# Patient Record
Sex: Female | Born: 1950 | Race: White | Hispanic: No | Smoking: Former smoker
Health system: Southern US, Community
[De-identification: ages and names within clinical notes are randomized; demographics above are authoritative.]

## PROBLEM LIST (undated history)

## (undated) DIAGNOSIS — I82409 Acute embolism and thrombosis of unspecified deep veins of unspecified lower extremity: Secondary | ICD-10-CM

## (undated) DIAGNOSIS — D589 Hereditary hemolytic anemia, unspecified: Secondary | ICD-10-CM

## (undated) DIAGNOSIS — I10 Essential (primary) hypertension: Secondary | ICD-10-CM

## (undated) DIAGNOSIS — I7090 Unspecified atherosclerosis: Secondary | ICD-10-CM

## (undated) DIAGNOSIS — I639 Cerebral infarction, unspecified: Secondary | ICD-10-CM

## (undated) HISTORY — DX: Acute embolism and thrombosis of unspecified deep veins of unspecified lower extremity: I82.409

---

## 2006-11-15 HISTORY — PX: ANKLE SURGERY: SHX546

## 2018-01-24 DIAGNOSIS — Z720 Tobacco use: Secondary | ICD-10-CM | POA: Insufficient documentation

## 2018-01-24 DIAGNOSIS — Z0000 Encounter for general adult medical examination without abnormal findings: Secondary | ICD-10-CM | POA: Diagnosis not present

## 2019-01-04 ENCOUNTER — Telehealth: Payer: Self-pay

## 2019-01-04 NOTE — Telephone Encounter (Signed)
Pt appears on [facility] Quality Report for [facility] but has never been seen in the office.  I called the pt to confirm her PCP, but I received a message that the call couldn't be completed at this time. VDM (DD)

## 2021-10-01 ENCOUNTER — Other Ambulatory Visit: Payer: Self-pay

## 2021-10-01 ENCOUNTER — Emergency Department (HOSPITAL_COMMUNITY): Payer: Medicare Other | Admitting: Certified Registered"

## 2021-10-01 ENCOUNTER — Emergency Department (HOSPITAL_COMMUNITY): Payer: Medicare Other

## 2021-10-01 ENCOUNTER — Inpatient Hospital Stay (HOSPITAL_COMMUNITY)
Admission: EM | Admit: 2021-10-01 | Discharge: 2021-10-07 | DRG: 024 | Disposition: A | Discharge status: Rehabilitation Facility | Payer: Medicare Other | Attending: Neurology | Admitting: Neurology

## 2021-10-01 ENCOUNTER — Inpatient Hospital Stay (HOSPITAL_COMMUNITY): Payer: Medicare Other

## 2021-10-01 ENCOUNTER — Encounter (HOSPITAL_COMMUNITY)
Admission: EM | Disposition: A | Discharge status: Rehabilitation Facility | Payer: Self-pay | Source: Home / Self Care | Attending: Neurology

## 2021-10-01 DIAGNOSIS — I10 Essential (primary) hypertension: Secondary | ICD-10-CM | POA: Diagnosis not present

## 2021-10-01 DIAGNOSIS — R29706 NIHSS score 6: Secondary | ICD-10-CM | POA: Diagnosis not present

## 2021-10-01 DIAGNOSIS — I63322 Cerebral infarction due to thrombosis of left anterior cerebral artery: Secondary | ICD-10-CM | POA: Diagnosis not present

## 2021-10-01 DIAGNOSIS — E871 Hypo-osmolality and hyponatremia: Secondary | ICD-10-CM | POA: Diagnosis present

## 2021-10-01 DIAGNOSIS — G319 Degenerative disease of nervous system, unspecified: Secondary | ICD-10-CM | POA: Diagnosis not present

## 2021-10-01 DIAGNOSIS — Z743 Need for continuous supervision: Secondary | ICD-10-CM | POA: Diagnosis not present

## 2021-10-01 DIAGNOSIS — I672 Cerebral atherosclerosis: Secondary | ICD-10-CM | POA: Diagnosis not present

## 2021-10-01 DIAGNOSIS — Z20822 Contact with and (suspected) exposure to covid-19: Secondary | ICD-10-CM | POA: Diagnosis present

## 2021-10-01 DIAGNOSIS — I63521 Cerebral infarction due to unspecified occlusion or stenosis of right anterior cerebral artery: Secondary | ICD-10-CM | POA: Diagnosis not present

## 2021-10-01 DIAGNOSIS — R6889 Other general symptoms and signs: Secondary | ICD-10-CM | POA: Diagnosis not present

## 2021-10-01 DIAGNOSIS — I69341 Monoplegia of lower limb following cerebral infarction affecting right dominant side: Secondary | ICD-10-CM | POA: Diagnosis not present

## 2021-10-01 DIAGNOSIS — E782 Mixed hyperlipidemia: Secondary | ICD-10-CM | POA: Diagnosis not present

## 2021-10-01 DIAGNOSIS — R531 Weakness: Secondary | ICD-10-CM | POA: Diagnosis not present

## 2021-10-01 DIAGNOSIS — F17200 Nicotine dependence, unspecified, uncomplicated: Secondary | ICD-10-CM | POA: Diagnosis present

## 2021-10-01 DIAGNOSIS — R4701 Aphasia: Secondary | ICD-10-CM | POA: Diagnosis not present

## 2021-10-01 DIAGNOSIS — G8191 Hemiplegia, unspecified affecting right dominant side: Secondary | ICD-10-CM | POA: Diagnosis not present

## 2021-10-01 DIAGNOSIS — I6300 Cerebral infarction due to thrombosis of unspecified precerebral artery: Secondary | ICD-10-CM | POA: Diagnosis not present

## 2021-10-01 DIAGNOSIS — E785 Hyperlipidemia, unspecified: Secondary | ICD-10-CM | POA: Diagnosis present

## 2021-10-01 DIAGNOSIS — I63512 Cerebral infarction due to unspecified occlusion or stenosis of left middle cerebral artery: Secondary | ICD-10-CM | POA: Diagnosis not present

## 2021-10-01 DIAGNOSIS — R0902 Hypoxemia: Secondary | ICD-10-CM | POA: Diagnosis not present

## 2021-10-01 DIAGNOSIS — I6523 Occlusion and stenosis of bilateral carotid arteries: Secondary | ICD-10-CM | POA: Diagnosis not present

## 2021-10-01 DIAGNOSIS — I639 Cerebral infarction, unspecified: Secondary | ICD-10-CM | POA: Diagnosis not present

## 2021-10-01 DIAGNOSIS — R404 Transient alteration of awareness: Secondary | ICD-10-CM | POA: Diagnosis not present

## 2021-10-01 DIAGNOSIS — G9389 Other specified disorders of brain: Secondary | ICD-10-CM | POA: Diagnosis not present

## 2021-10-01 DIAGNOSIS — I63522 Cerebral infarction due to unspecified occlusion or stenosis of left anterior cerebral artery: Secondary | ICD-10-CM | POA: Diagnosis not present

## 2021-10-01 DIAGNOSIS — I6389 Other cerebral infarction: Secondary | ICD-10-CM | POA: Diagnosis not present

## 2021-10-01 HISTORY — PX: IR INTRA CRAN STENT: IMG2345

## 2021-10-01 HISTORY — PX: IR US GUIDE VASC ACCESS RIGHT: IMG2390

## 2021-10-01 HISTORY — PX: RADIOLOGY WITH ANESTHESIA: SHX6223

## 2021-10-01 HISTORY — PX: IR PERCUTANEOUS ART THROMBECTOMY/INFUSION INTRACRANIAL INC DIAG ANGIO: IMG6087

## 2021-10-01 LAB — URINALYSIS, ROUTINE W REFLEX MICROSCOPIC
Bacteria, UA: NONE SEEN
Bilirubin Urine: NEGATIVE
Glucose, UA: NEGATIVE mg/dL
Hgb urine dipstick: NEGATIVE
Ketones, ur: 5 mg/dL — AB
Leukocytes,Ua: NEGATIVE
Nitrite: NEGATIVE
Protein, ur: 30 mg/dL — AB
Specific Gravity, Urine: >1.046 — ABNORMAL HIGH (ref 1.005–1.030)
pH: 6 (ref 5.0–8.0)

## 2021-10-01 LAB — CBC
HCT: 44.3 % (ref 36.0–46.0)
Hemoglobin: 15.2 g/dL — ABNORMAL HIGH (ref 12.0–15.0)
MCH: 32.8 pg (ref 26.0–34.0)
MCHC: 34.3 g/dL (ref 30.0–36.0)
MCV: 95.7 fL (ref 80.0–100.0)
Platelets: 246 10*3/uL (ref 150–400)
RBC: 4.63 MIL/uL (ref 3.87–5.11)
RDW: 12.9 % (ref 11.5–15.5)
WBC: 7.9 10*3/uL (ref 4.0–10.5)
nRBC: 0 % (ref 0.0–0.2)

## 2021-10-01 LAB — DIFFERENTIAL
Abs Immature Granulocytes: 0.04 10*3/uL (ref 0.00–0.07)
Basophils Absolute: 0 10*3/uL (ref 0.0–0.1)
Basophils Relative: 0 %
Eosinophils Absolute: 0 10*3/uL (ref 0.0–0.5)
Eosinophils Relative: 0 %
Immature Granulocytes: 1 %
Lymphocytes Relative: 14 %
Lymphs Abs: 1.1 10*3/uL (ref 0.7–4.0)
Monocytes Absolute: 0.5 10*3/uL (ref 0.1–1.0)
Monocytes Relative: 6 %
Neutro Abs: 6.2 10*3/uL (ref 1.7–7.7)
Neutrophils Relative %: 79 %

## 2021-10-01 LAB — I-STAT CHEM 8, ED
BUN: 23 mg/dL (ref 8–23)
Calcium, Ion: 1.17 mmol/L (ref 1.15–1.40)
Chloride: 106 mmol/L (ref 98–111)
Creatinine, Ser: 0.7 mg/dL (ref 0.44–1.00)
Glucose, Bld: 114 mg/dL — ABNORMAL HIGH (ref 70–99)
HCT: 44 % (ref 36.0–46.0)
Hemoglobin: 15 g/dL (ref 12.0–15.0)
Potassium: 4.2 mmol/L (ref 3.5–5.1)
Sodium: 139 mmol/L (ref 135–145)
TCO2: 23 mmol/L (ref 22–32)

## 2021-10-01 LAB — COMPREHENSIVE METABOLIC PANEL
ALT: 25 U/L (ref 0–44)
AST: 31 U/L (ref 15–41)
Albumin: 3.9 g/dL (ref 3.5–5.0)
Alkaline Phosphatase: 75 U/L (ref 38–126)
Anion gap: 10 (ref 5–15)
BUN: 21 mg/dL (ref 8–23)
CO2: 21 mmol/L — ABNORMAL LOW (ref 22–32)
Calcium: 9.3 mg/dL (ref 8.9–10.3)
Chloride: 103 mmol/L (ref 98–111)
Creatinine, Ser: 0.9 mg/dL (ref 0.44–1.00)
GFR, Estimated: >60 mL/min (ref >60)
Glucose, Bld: 125 mg/dL — ABNORMAL HIGH (ref 70–99)
Potassium: 4.6 mmol/L (ref 3.5–5.1)
Sodium: 134 mmol/L — ABNORMAL LOW (ref 135–145)
Total Bilirubin: 1 mg/dL (ref 0.3–1.2)
Total Protein: 7.2 g/dL (ref 6.5–8.1)

## 2021-10-01 LAB — RAPID URINE DRUG SCREEN, HOSP PERFORMED
Amphetamines: NOT DETECTED
Barbiturates: NOT DETECTED
Benzodiazepines: NOT DETECTED
Cocaine: NOT DETECTED
Opiates: NOT DETECTED
Tetrahydrocannabinol: NOT DETECTED

## 2021-10-01 LAB — RESP PANEL BY RT-PCR (FLU A&B, COVID) ARPGX2
Influenza A by PCR: NEGATIVE
Influenza B by PCR: NEGATIVE
SARS Coronavirus 2 by RT PCR: NEGATIVE

## 2021-10-01 LAB — CBG MONITORING, ED: Glucose-Capillary: 119 mg/dL — ABNORMAL HIGH (ref 70–99)

## 2021-10-01 LAB — MRSA NEXT GEN BY PCR, NASAL: MRSA by PCR Next Gen: NOT DETECTED

## 2021-10-01 LAB — PROTIME-INR
INR: 0.9 (ref 0.8–1.2)
Prothrombin Time: 12.3 seconds (ref 11.4–15.2)

## 2021-10-01 LAB — ETHANOL: Alcohol, Ethyl (B): <10 mg/dL (ref <10)

## 2021-10-01 LAB — APTT: aPTT: 25 seconds (ref 24–36)

## 2021-10-01 IMAGING — XA IR PERCUTANEOUS ART THORMBECTOMY/INFUSION INTRACRANIAL INCLUDE D
9 of 11 series · 12 of 24 positions shown · IV contrast (IODINE)
Comparison: CT/CT angiogram of the head and neck [DATE].

INDICATION: 70-year-old female with past medical history significant for
hyperlipidemia presenting with right leg weakness and aphasia. Leg
weakness status 2 days ago with worsening this morning when
difficulty speaking was also noted. Her last known well was
[DATE]; NIHSS 6; baseline modified HEMI 0. Head CT
showed hypodense areas in the medial left frontal lobe CT angiogram
showed an occlusion of the left A2/ACA. CT perfusion showed no core
infarct with a penumbra of 27 mL. Given discrepancy between CT
perfusion and noncontrast CT, an MRI of the brain was obtained
showing scattered infarcts in the left ACA territory. Given waxing
and waning symptoms and imaging/clinical mismatch, decision was made
to proceed with diagnostic cerebral angiogram and mechanical
thrombectomy after discussion with patient and her husband.

EXAM:
FLUOROSCOPY GUIDED VASCULAR [REDACTED] CEREBRAL ANGIOGRAM
MECHANICAL THROMBECTOMY
INTRACRANIAL STENTING
FLAT PANEL HEAD CT
TECHNIQUE: Informed written consent was obtained from the patient and her
husband after a thorough discussion of the procedural risks,
benefits and alternatives. All questions were addressed.

[Series 2: cerebral · 2 acquisitions, 2 frames shown (1 of 7)]
[im 1/2]
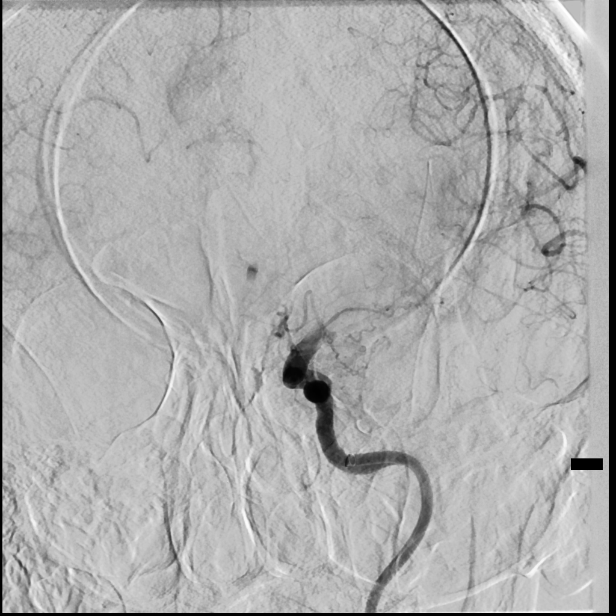
[im 2/2]
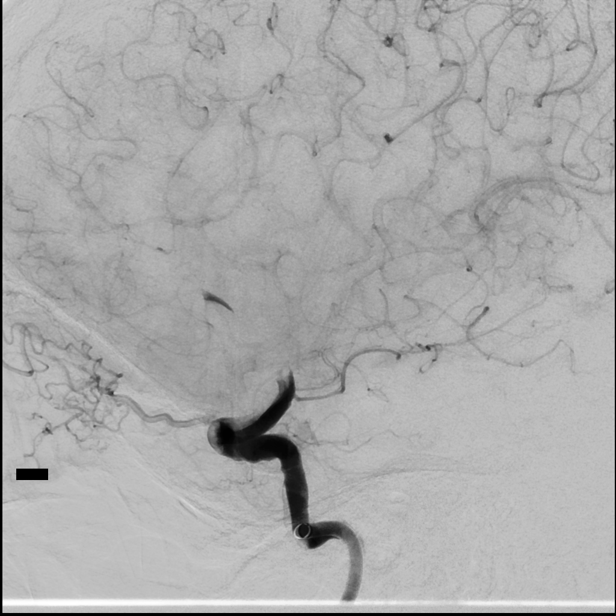

[Series 4: cerebral · 2 acquisitions, 1 frame shown (2 of 7)]
[im 1/2]
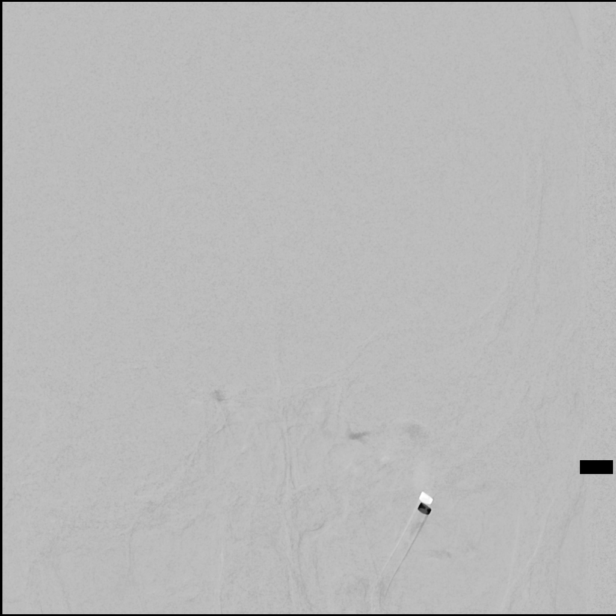

[Series 6: cerebral · 2 acquisitions, 1 frame shown (3 of 7)]
[im 1/2]
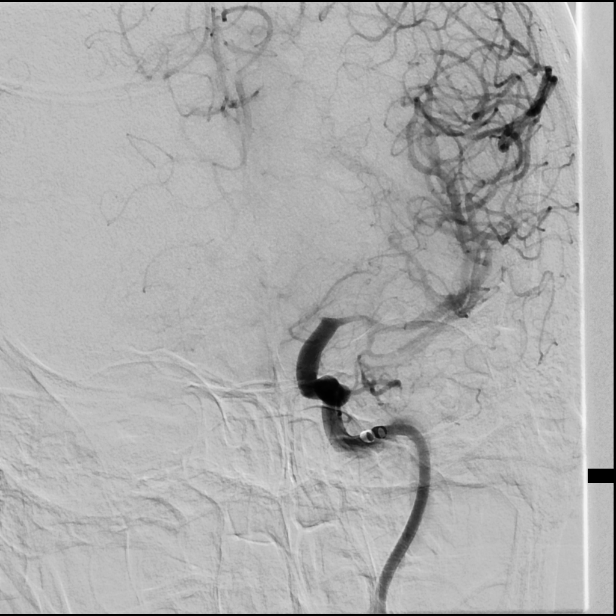

[Series 7: cerebral · 2 acquisitions, 1 frame shown (4 of 7)]
[im 1/2]
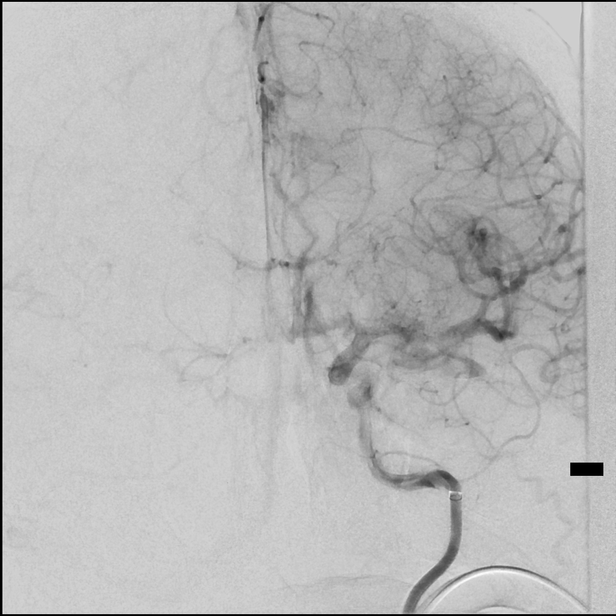

[Series 8: cerebral · 2 acquisitions, 1 frame shown (5 of 7)]
[im 1/2]
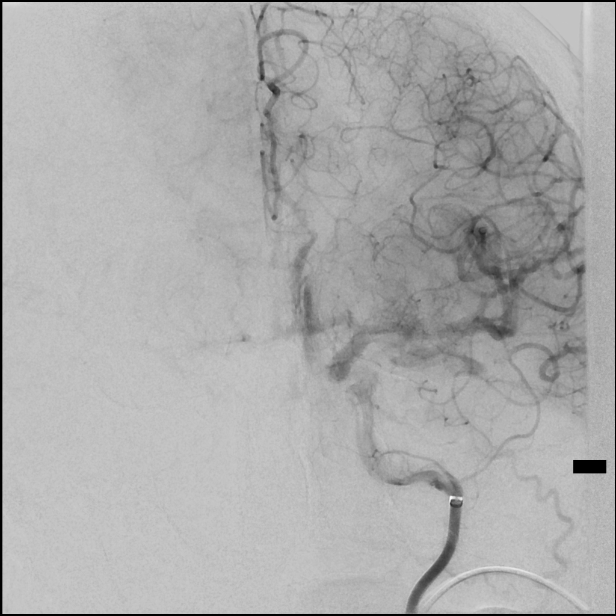

[Series 9: cerebral · 2 acquisitions, 1 frame shown (6 of 7)]
[im 1/2]
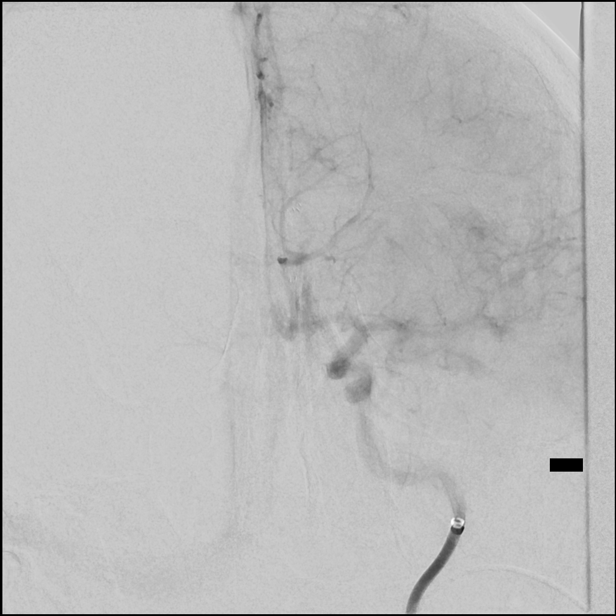

[Series 12: fl neuro · 1 of 40 frames shown]
[frame 7/40]
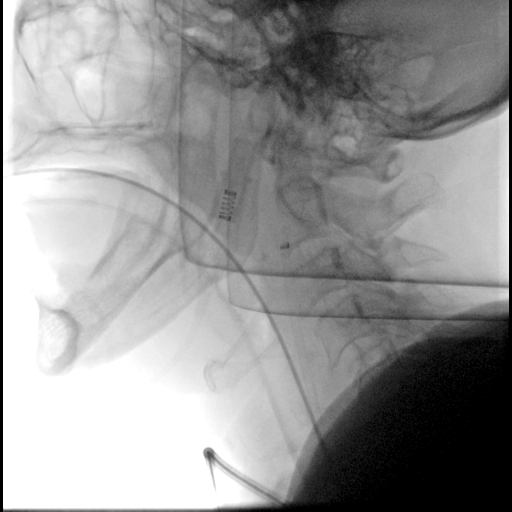

[Series 13: cerebral · 2 acquisitions, 1 frame shown (7 of 7)]
[im 1/2]
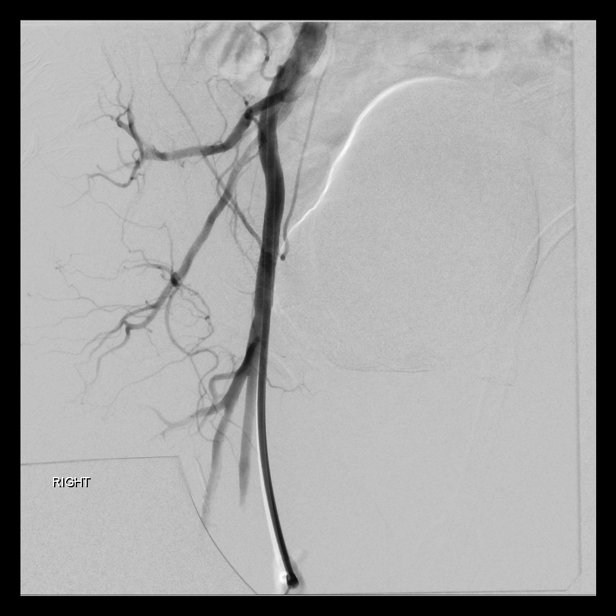

[Series 300: dr. (person_name) · 3 of 22 slices shown]
[im 2/22]
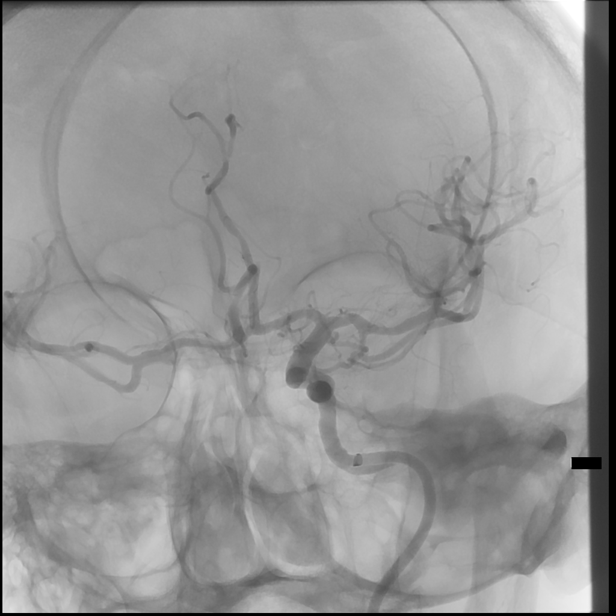
[im 12/22]
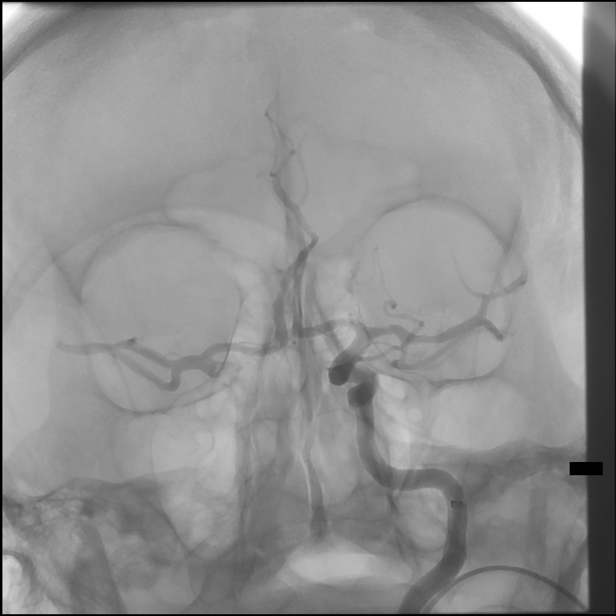
[im 22/22]
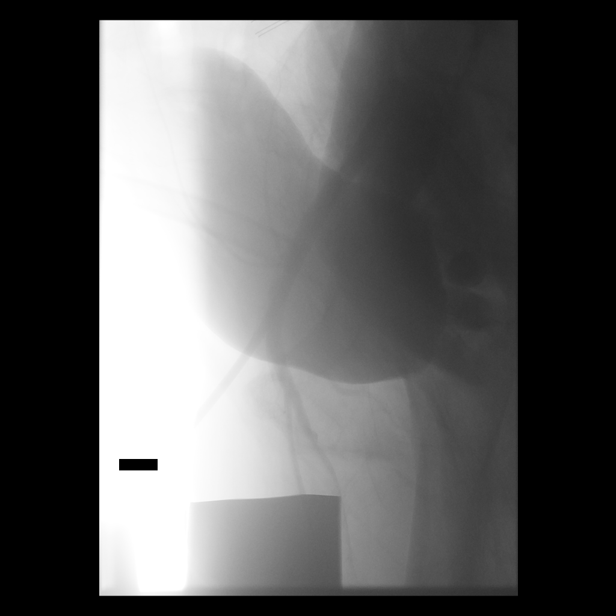

[12 of 24 positions shown; findings below may reference images not displayed]

MEDICATIONS:
Intra arterial Cangrelor loading dose and drip.

ANESTHESIA/SEDATION:
The procedure was performed under general anesthesia.

CONTRAST:  100 mL of Omnipaque 300 milligram/mL

FLUOROSCOPY TIME:  Fluoroscopy Time: 22 minutes 30 seconds (1,164
mGy).

COMPLICATIONS:
SIR LEVEL B - Normal therapy, includes overnight admission for
observation.
Maximal Sterile Barrier Technique was utilized including caps, mask,
sterile gowns, sterile gloves, sterile drape, hand hygiene and skin
antiseptic. A timeout was performed prior to the initiation of the
procedure.

The right groin was prepped and draped in the usual sterile fashion.
Using a micropuncture kit and the modified Seldinger technique,
access was gained to the right common femoral artery and an 8 French
sheath was placed. Real-time ultrasound guidance was utilized for
vascular access including the acquisition of a permanent ultrasound
image documenting patency of the accessed vessel.

Under fluoroscopy, a Zoom 88 guide catheter was navigated over a 6
HEMI 2 catheter and a 0.035" Terumo Glidewire into the
aortic arch. The catheter was placed into the left common carotid
artery and then advanced into the left internal carotid artery. The
inner catheter was removed. Frontal and lateral angiograms of the
head were obtained followed by magnified right anterior oblique,
magnified left anterior oblique and magnified lateral views of the
head.
FINDINGS: 1. The right common femoral artery has normal caliber, adequate for
vascular access.
2. There is an occlusion of the mid left A2/ACA.
3. Intracranial atherosclerotic disease with multifocal areas of
mild stenosis in the left ACA and MCA vascular trees.

PROCEDURE:
Under biplane roadmap, a Zoom 55 aspiration catheter was navigated
over a phenom 21 microcatheter and an Aristotle 18 microguidewire
into the cavernous segment of the left ICA. The aspiration catheter
was then advanced to the level of occlusion in the left A2/ACA and
connected to an aspiration pump. Continuous aspiration was performed
for 2 minutes. The guide catheter was connected to a VacLok syringe.
The aspiration catheter was subsequently removed under constant
aspiration. The guide catheter was aspirated for debris.

Left ICA angiograms showed complete recanalization of the left ACA
([NU]) with luminal irregularity of the A2 segment concerning for
dissection. There is no flow limitation.

Delay left ICA angiograms with magnified frontal and lateral views
of the head showed clot formation within the dissected segment
though without flow limitation. Due to concern for clot propagation
and reocclusion, decision was made to treat dissection with
stenting.

Patient received a loading dose of cangrelor and a drip was
subsequently started.

Under biplane roadmap, an SL 10 microcatheter was navigated over an
Aristotle 14 micro guidewire into the left A3/ACA segment. Then, a 3
x 21 mm neuroform atlas stent was deployed spanning the A2 segment,
covering the dissected segment. Follow-up magnified frontal and
lateral angiograms of the head showed adequate stent positioning
with fully expanded stent and excellent anterograde flow.

Flat panel CT of the head was obtained and post processed in a
separate workstation with concurrent attending physician
supervision. Selected images were sent to PACS. No evidence of
hemorrhagic complication.

Delayed magnified frontal and lateral views of the head centered on
the recently deployed left A2/ACA stent. No in stent clot formation
seen. Preserved anterograde flow.

The catheter was subsequently withdrawn. Right common femoral artery
angiograms were obtained in frontal and lateral views. The puncture
is at the level of the common femoral artery which has adequate
caliber for closure device. The femoral sheath was exchanged over
the wire for a Perclose prostyle which was utilized for access
closure. Immediate hemostasis was achieved.
IMPRESSION: 1. Successful mechanical thrombectomy with 1 pass direct contact
aspiration for treatment of a left A2/ACA occlusion.
2. Procedure was complicated by catheter related dissection treated
with stenting with preservation of the anterograde flow.

PLAN:
Dual anti-platelet therapy for 6 months with evaluation of stent
patency at that time.

## 2021-10-01 IMAGING — CT CT HEAD CODE STROKE
4 series · 15 of 47 positions shown, 17 images · non-contrast
Comparison: None.

CLINICAL DATA: Code stroke. Neuro deficit, acute, stroke suspected.

EXAM:
CT HEAD WITHOUT CONTRAST
TECHNIQUE: Contiguous axial images were obtained from the base of the skull
through the vertex without intravenous contrast.

[Series 2: head wo · axial · 0.34mm/px · z∈[-65,+37]mm · 7 of 34 slices shown, 9 images]
[im 5/34  brain]
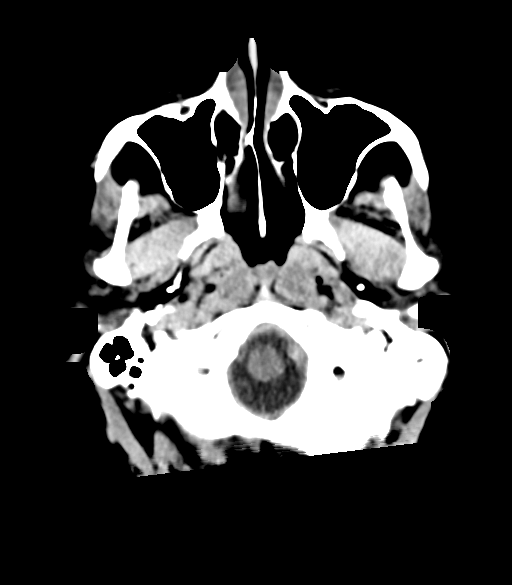
[im 5/34  bone]
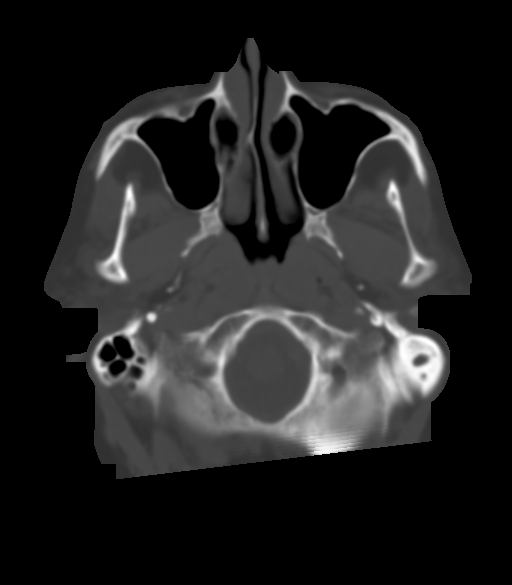
[im 9/34  brain]
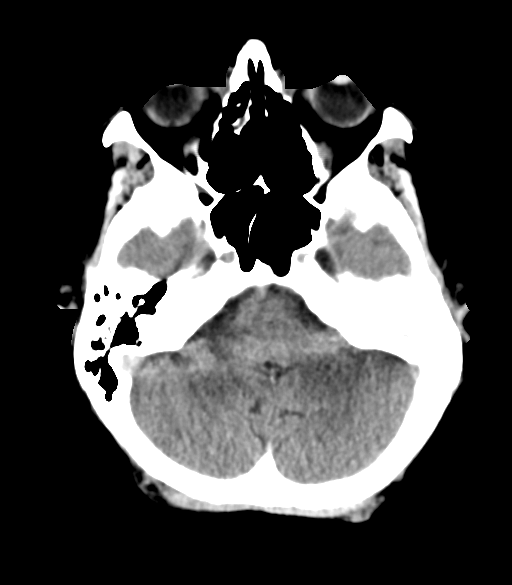
[im 13/34  brain]
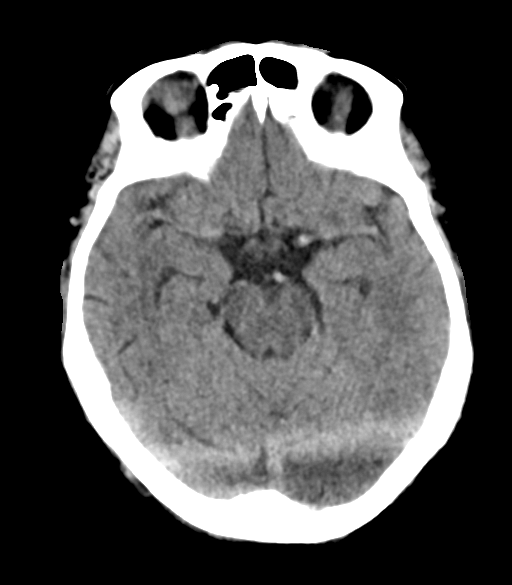
[im 17/34  brain]
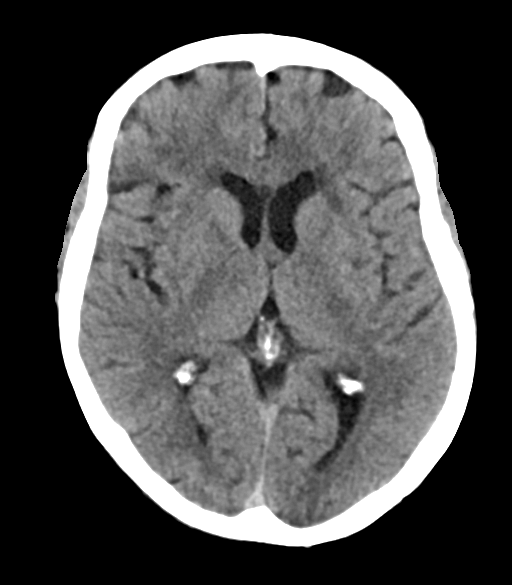
[im 21/34  brain]
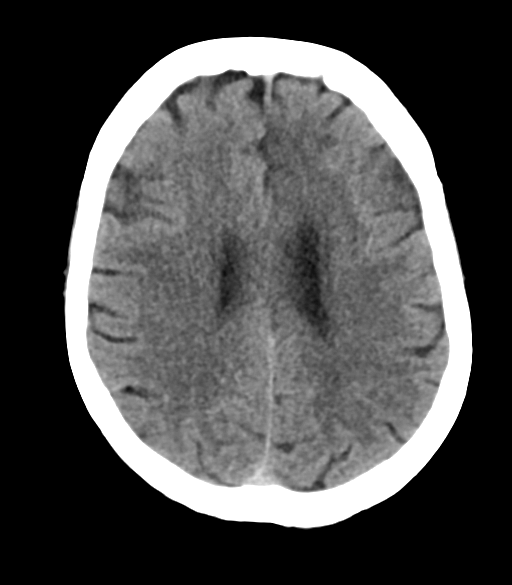
[im 21/34  bone]
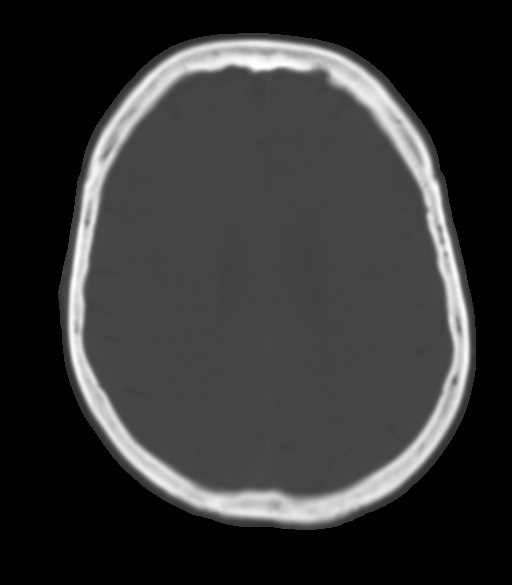
[im 25/34  brain]
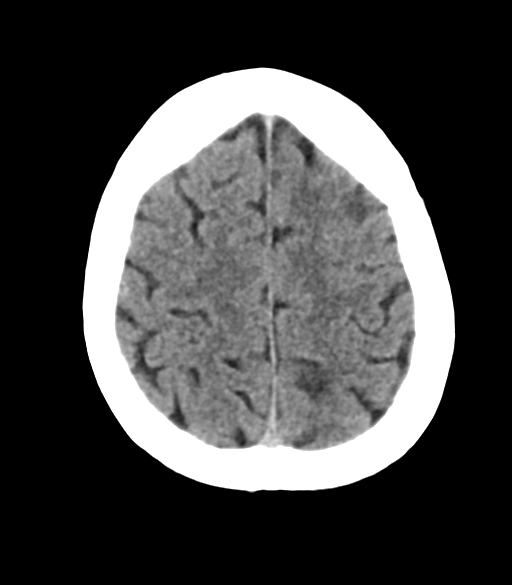
[im 29/34  brain]
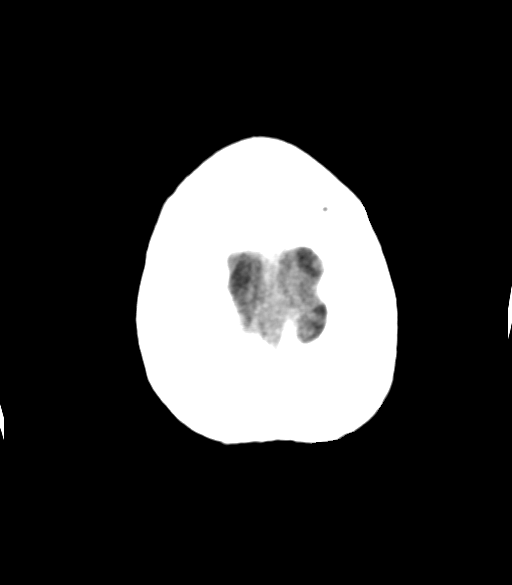

[Series 4: head bone · axial · 0.49mm/px · z∈[-134,-118]mm · 2 of 84 slices shown]
[im 9/84  bone]
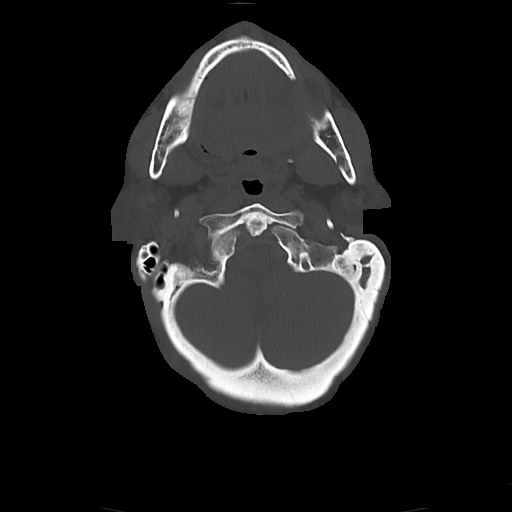
[im 17/84  bone]
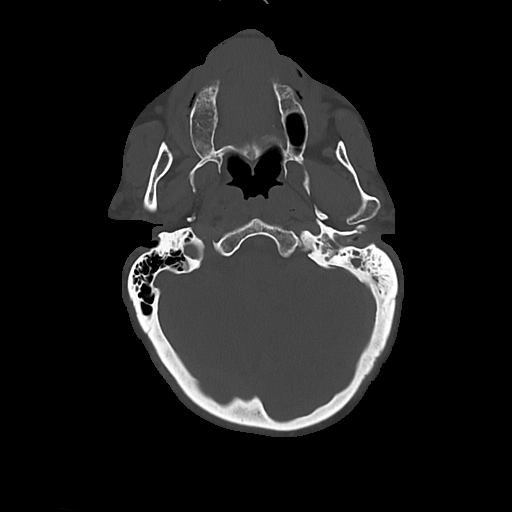

[Series 5: cor soft · coronal · 0.32mm/px · 3 of 67 slices shown]
[im 25/67  brain]
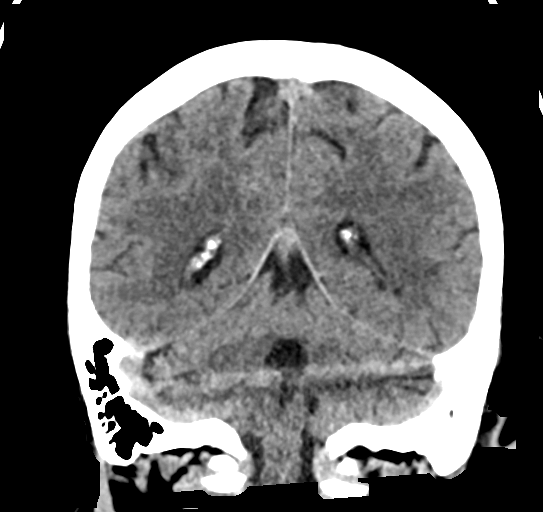
[im 30/67  brain]
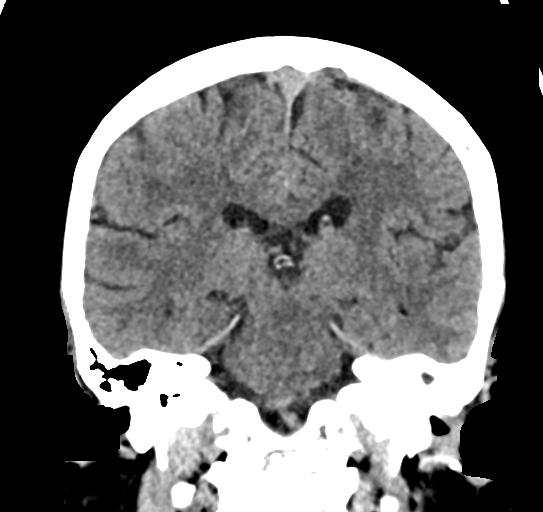
[im 36/67  brain]
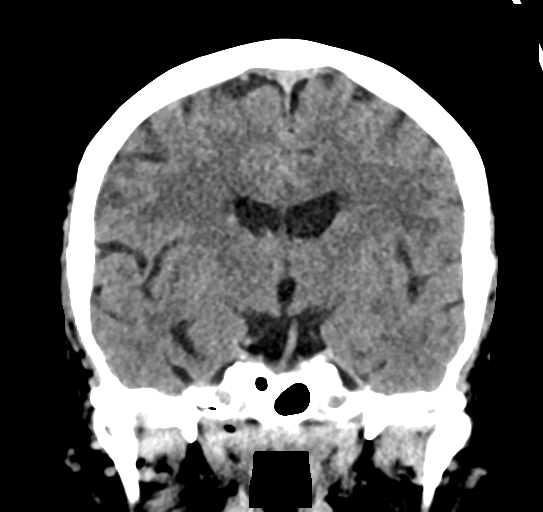

[Series 6: sag soft · sagittal · 0.32mm/px · 3 of 59 slices shown]
[im 20/59  brain]
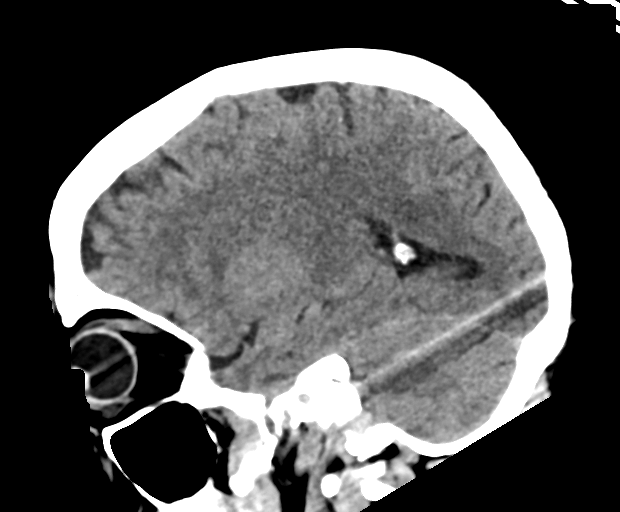
[im 30/59  brain]
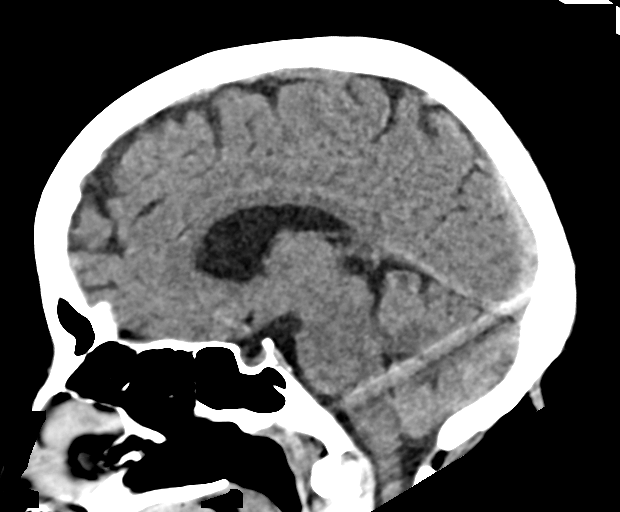
[im 39/59  brain]
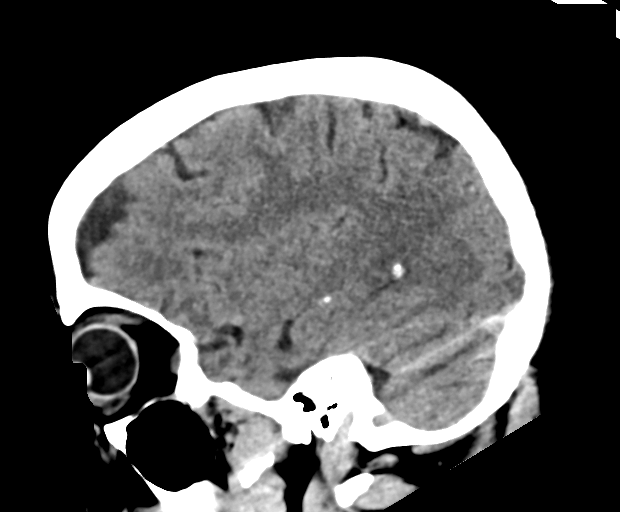

[15 of 47 positions shown; findings below may reference images not displayed]

FINDINGS: Brain: No focal abnormality affects the brainstem or cerebellum.
There is loss of gray-white differentiation in the left frontal lobe
suggesting left anterior cerebral artery territory infarction. There
is an old small vessel infarction in the left basal ganglia. No
mass, hemorrhage, hydrocephalus or extra-axial collection.

Vascular: There is atherosclerotic calcification of the major
vessels at the base of the brain.

Skull: Negative

Sinuses/Orbits: Clear/normal

Other: None

ASPECTS (Alberta Stroke Program Early CT Score)

- Ganglionic level infarction (caudate, lentiform nuclei, internal
capsule, insula, M1-M3 cortex): 7

- Supraganglionic infarction (M4-M6 cortex): 3

Total score (0-10 with 10 being normal): 10
IMPRESSION: 1. Acute infarction in the left frontal lobe, anterior cerebral
artery territory. Loss of gray-white differentiation. No hemorrhage.
2. ASPECTS is 10
3. These results were communicated to Dr. MOFIDUL at [DATE] on
[DATE] by text page via the [employer] messaging system.

## 2021-10-01 IMAGING — CT CT HEAD W/O CM
4 series · 16 of 47 positions shown, 18 images · non-contrast
Comparison: [DATE]

CLINICAL DATA: Stroke, follow-up status post mechanical
thrombectomy and left A2 stenting

EXAM:
CT HEAD WITHOUT CONTRAST
TECHNIQUE: Contiguous axial images were obtained from the base of the skull
through the vertex without intravenous contrast.

[Series 3: head without · axial · non-contrast · 0.43mm/px · z∈[-142,-22]mm · 7 of 34 slices shown, 9 images]
[im 5/34  brain]
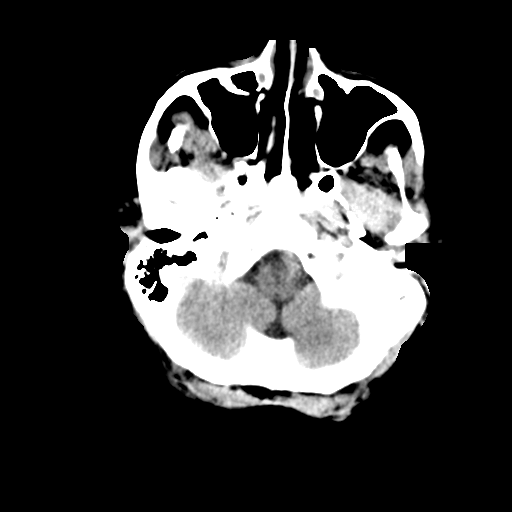
[im 5/34  bone]
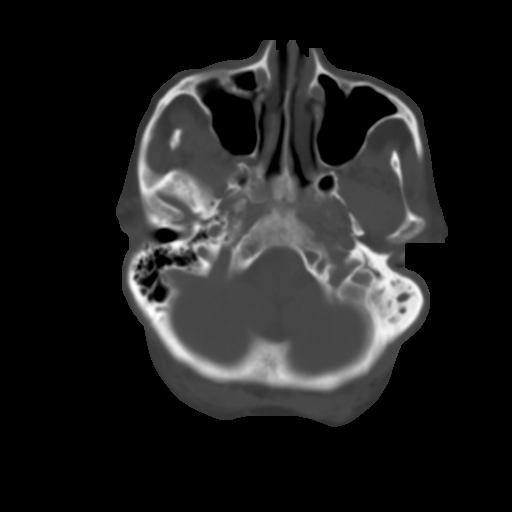
[im 9/34  brain]
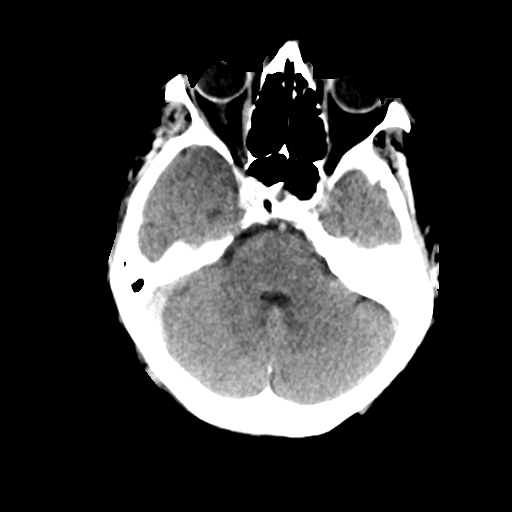
[im 13/34  brain]
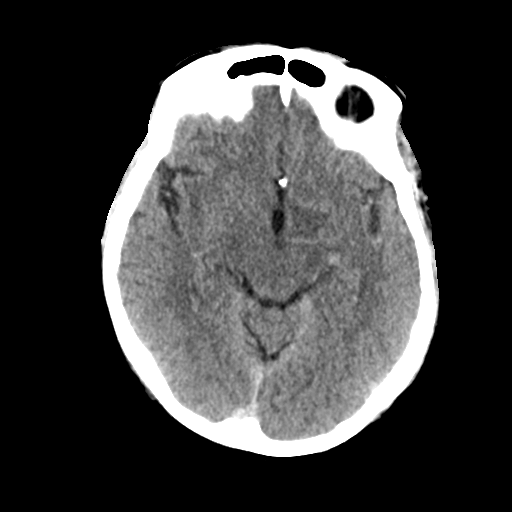
[im 17/34  brain]
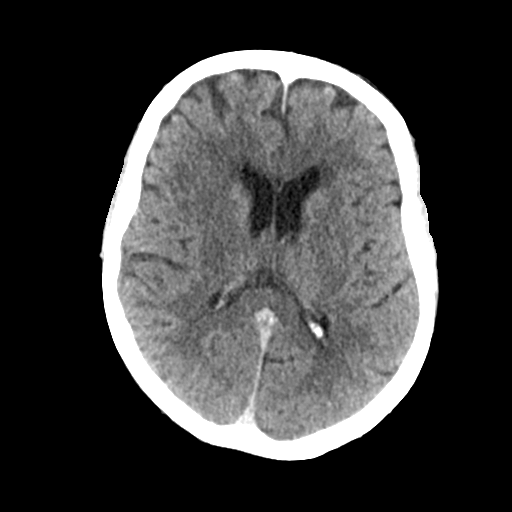
[im 21/34  brain]
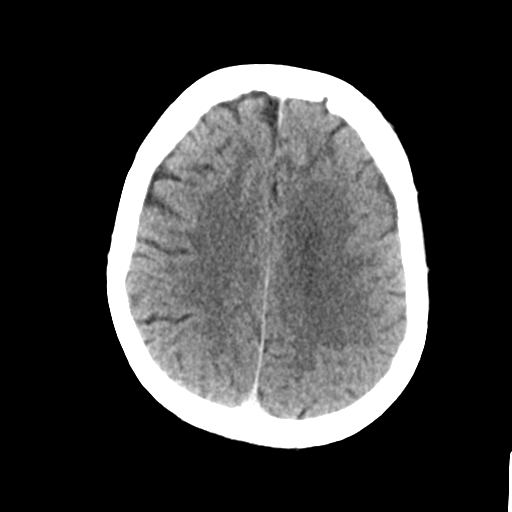
[im 21/34  bone]
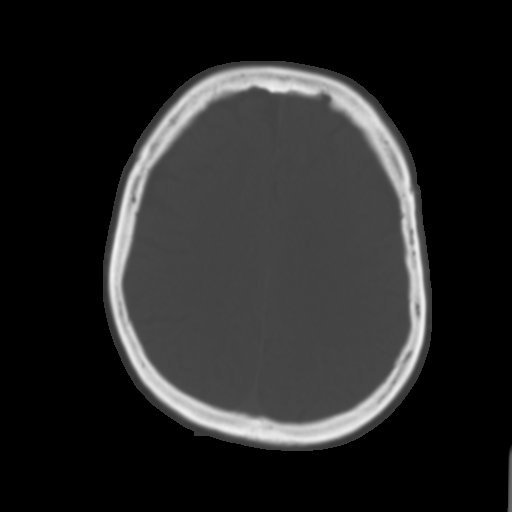
[im 25/34  brain]
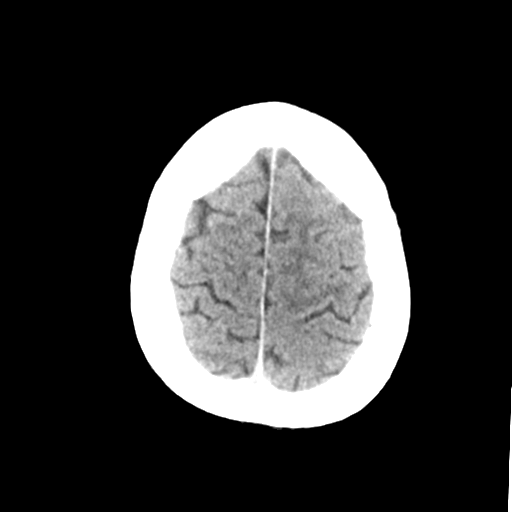
[im 29/34  brain]
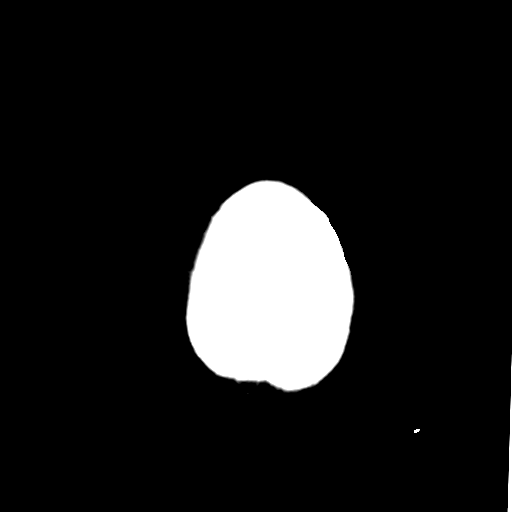

[Series 4: head bone · axial · 0.43mm/px · z∈[-146,-114]mm · 3 of 83 slices shown]
[im 9/83  bone]
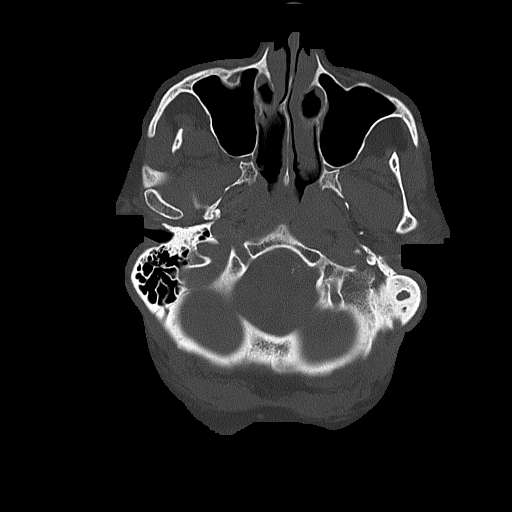
[im 17/83  bone]
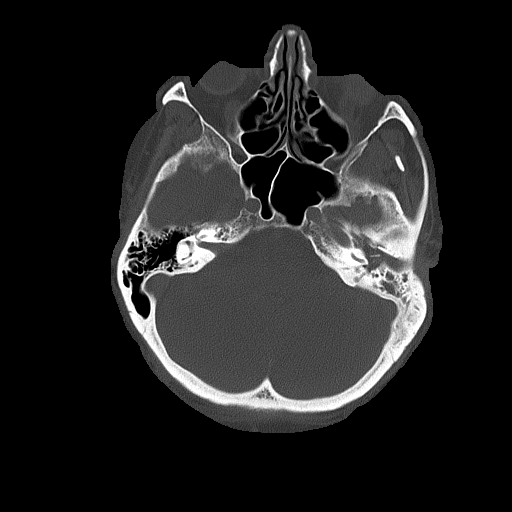
[im 25/83  bone]
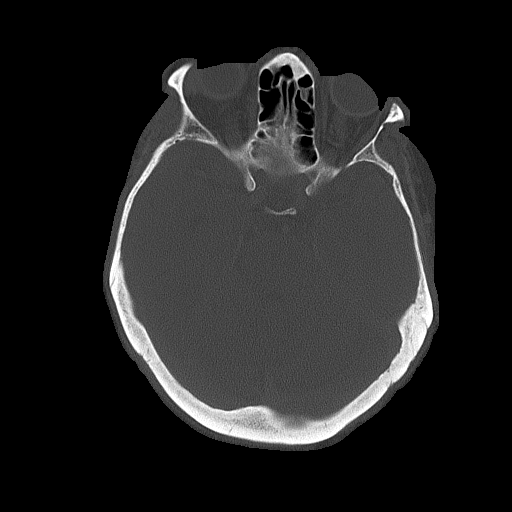

[Series 5: head without cor · coronal · non-contrast · 0.32mm/px · 3 of 67 slices shown]
[im 23/67  brain]
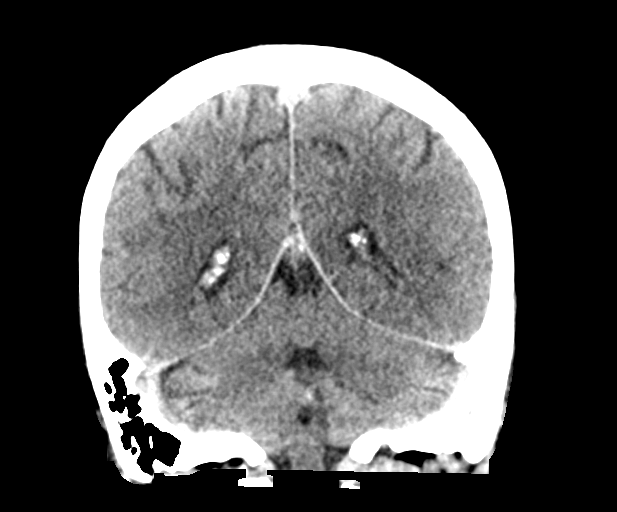
[im 30/67  brain]
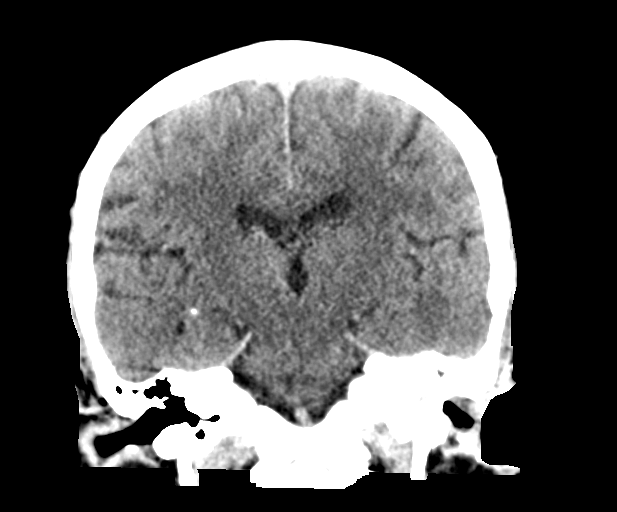
[im 37/67  brain]
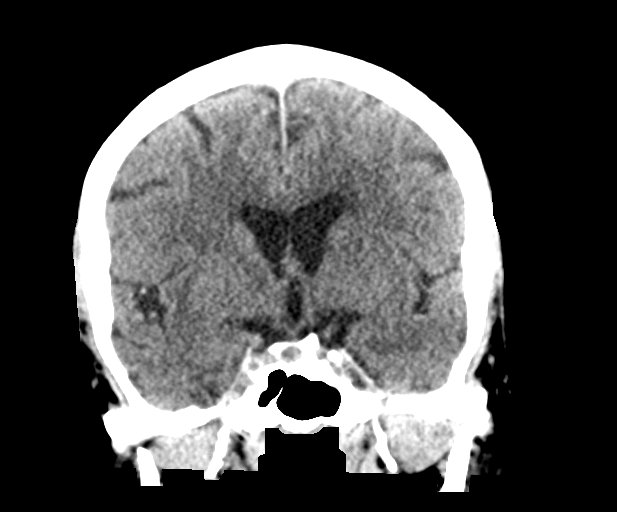

[Series 6: head without sag · sagittal · non-contrast · 0.32mm/px · 3 of 66 slices shown]
[im 22/66  brain]
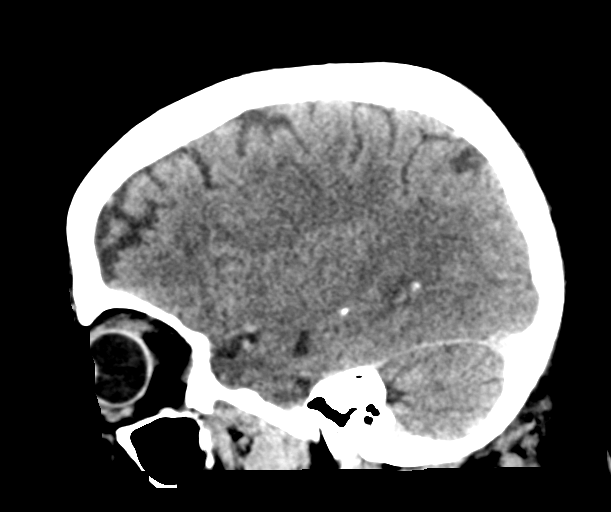
[im 33/66  brain]
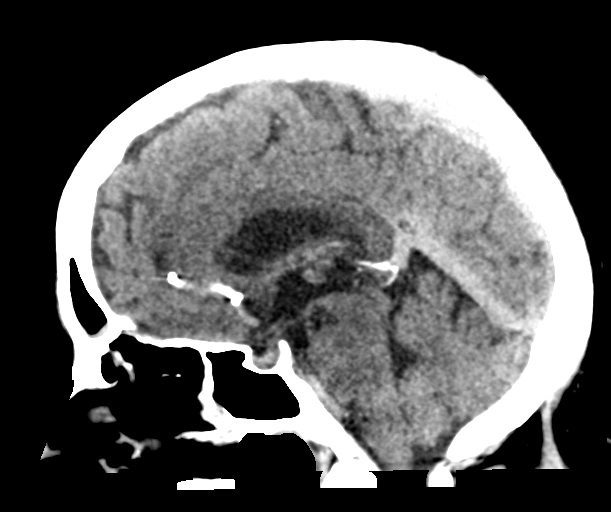
[im 44/66  brain]
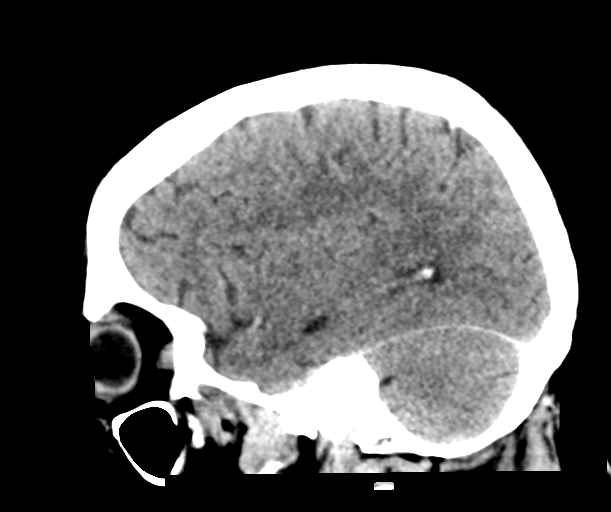

[16 of 47 positions shown; findings below may reference images not displayed]

FINDINGS: Brain: Redemonstrated mild loss of gray-white differentiation in the
medial left frontal lobe, consistent with known left ACA occlusion,
although the loss of gray-white is less distinct on the current
exam. No new area of infarction. No hemorrhage, mass, mass effect,
or midline shift. No hydrocephalus or extra-axial collection.

Vascular: Interval placement of left A2 stent. No hyperdense vessel.
Atherosclerotic calcifications in the intracranial carotid and
vertebral arteries.

Skull: Normal. Negative for fracture or focal lesion.

Sinuses/Orbits: Mucosal thickening in the left frontal sinus and
left anterior ethmoid air cells. The orbits are unremarkable.

Other: Fluid in left mastoid air cells.
IMPRESSION: Status post placement of a left A2 stent, with diminished
conspicuity of previously noted loss of gray-white differentiation
in the medial left frontal lobe. No new area of infarction.

## 2021-10-01 IMAGING — MR MR HEAD W/O CM
5 series · 48 of 48 positions shown · non-contrast
Comparison: CT head [DATE]

CLINICAL DATA: Acute neuro deficit.

EXAM:
MRI HEAD WITHOUT CONTRAST
TECHNIQUE: Multiplanar, multiecho pulse sequences of the brain and surrounding
structures were obtained without intravenous contrast.

[Series 2: DWI · axial · 3.0mm · 0.94mm/px · z∈[-27,+108]mm · 16 of 93 slices shown (1 of 2)]
[im 1/93]
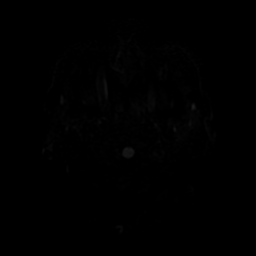
[im 7/93]
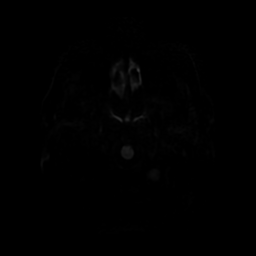
[im 13/93]
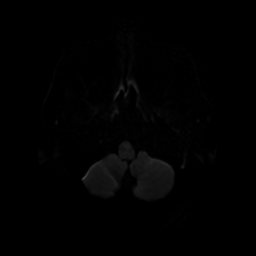
[im 19/93]
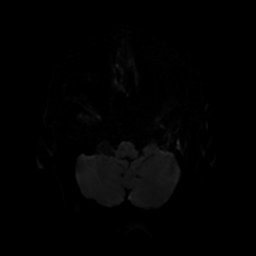
[im 25/93]
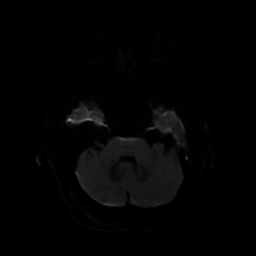
[im 31/93]
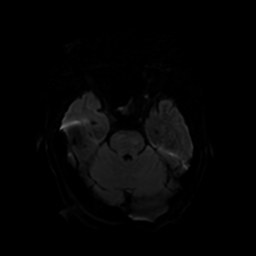
[im 37/93]
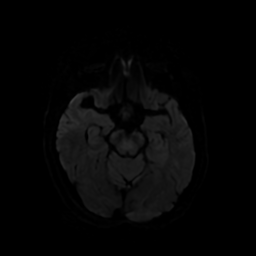
[im 43/93]
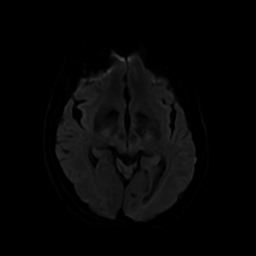
[im 50/93]
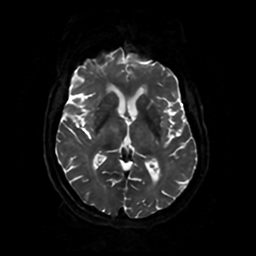
[im 56/93]
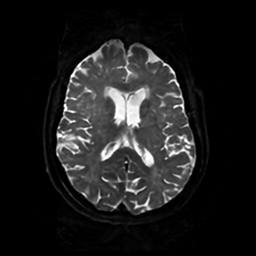
[im 62/93]
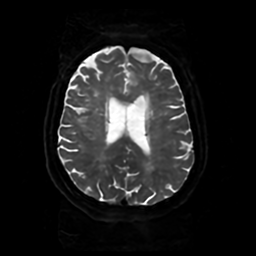
[im 68/93]
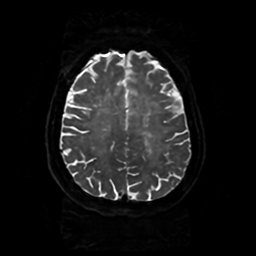
[im 74/93]
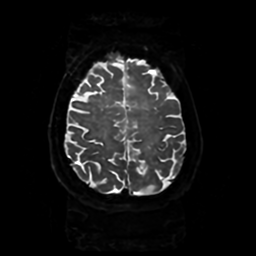
[im 80/93]
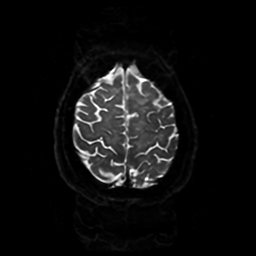
[im 86/93]
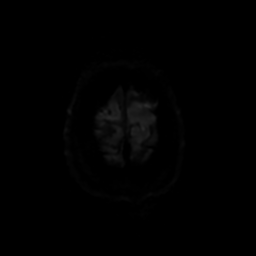
[im 93/93]
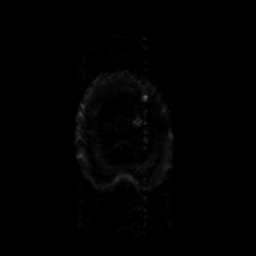

[Series 3: FLAIR · axial · 4.0mm · 0.45mm/px · z∈[-28,+110]mm · 6 of 33 slices shown]
[im 1/33]
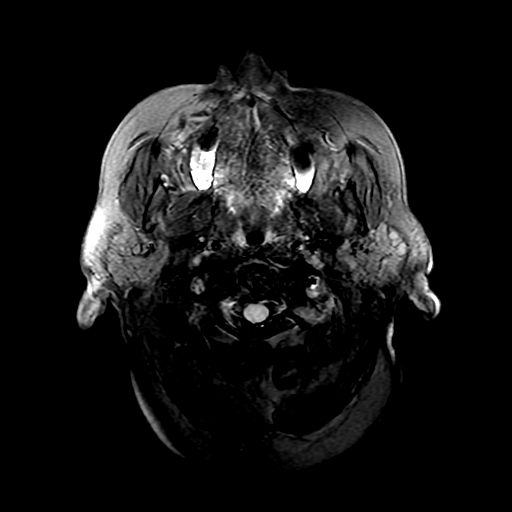
[im 7/33]
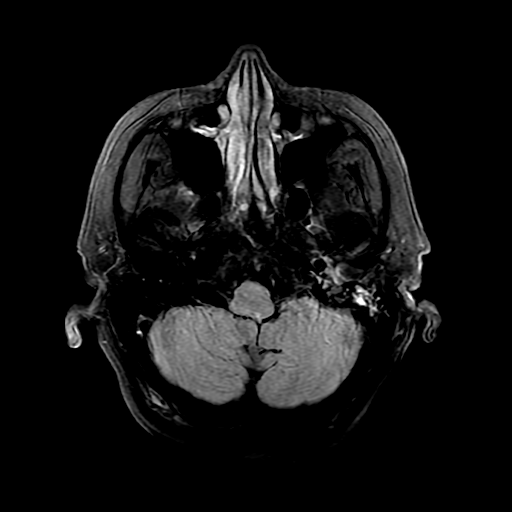
[im 13/33]
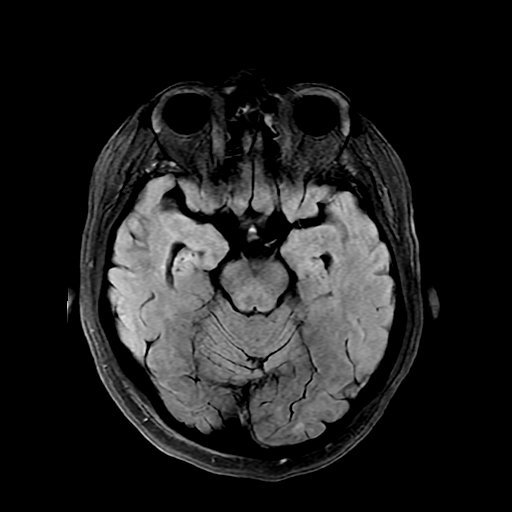
[im 20/33]
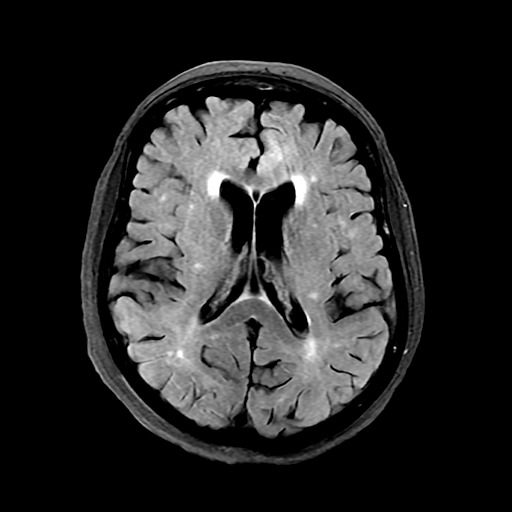
[im 26/33]
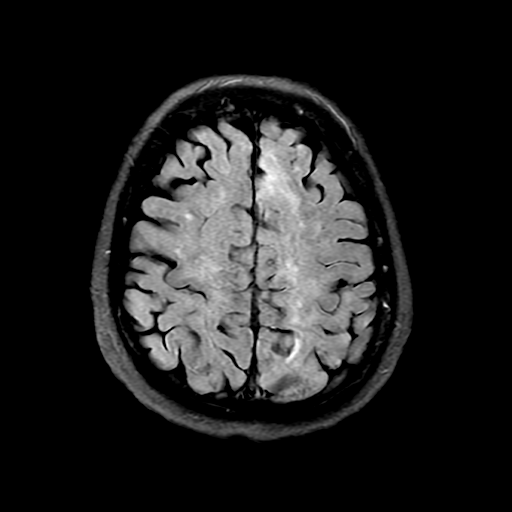
[im 33/33]
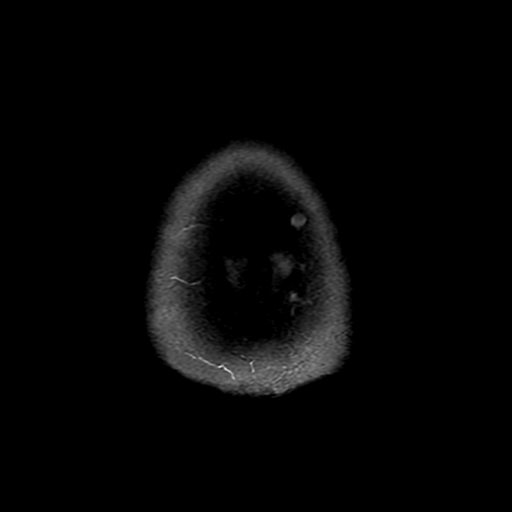

[Series 4: DWI · coronal · 4.0mm · 0.94mm/px · 12 of 68 slices shown (2 of 2)]
[im 1/68]
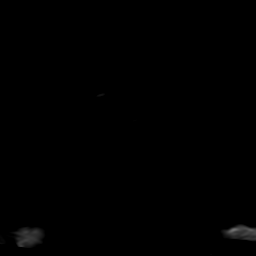
[im 7/68]
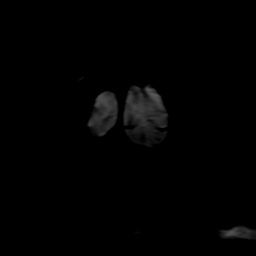
[im 13/68]
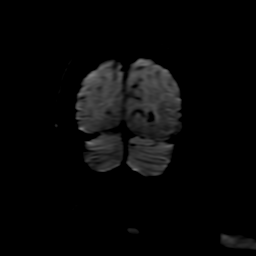
[im 19/68]
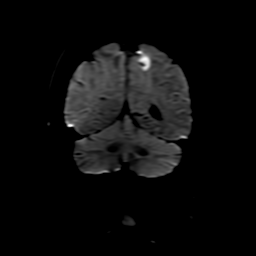
[im 25/68]
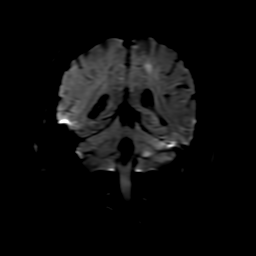
[im 31/68]
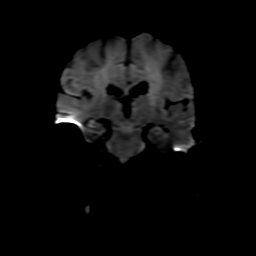
[im 37/68]
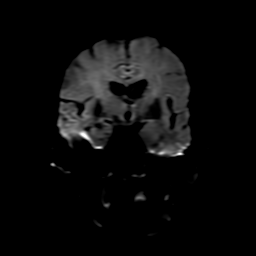
[im 43/68]
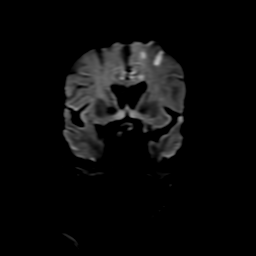
[im 49/68]
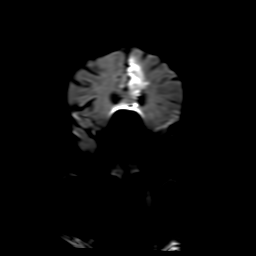
[im 55/68]
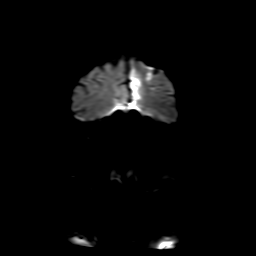
[im 61/68]
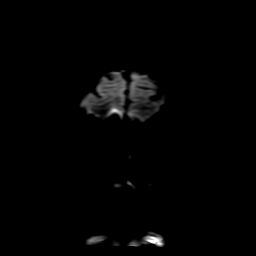
[im 68/68]
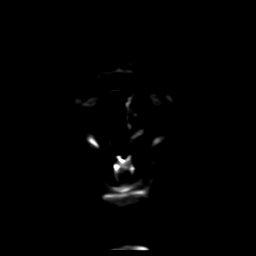

[Series 250: ADC · axial · 3.0mm · 0.94mm/px · z∈[-27,+108]mm · 8 of 47 slices shown (1 of 2)]
[im 1/47]
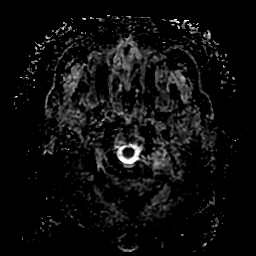
[im 7/47]
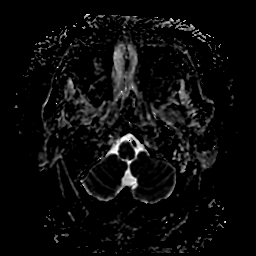
[im 14/47]
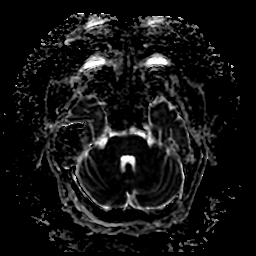
[im 20/47]
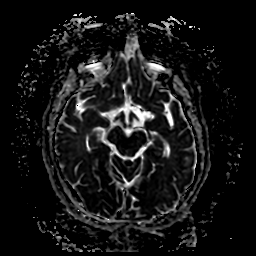
[im 27/47]
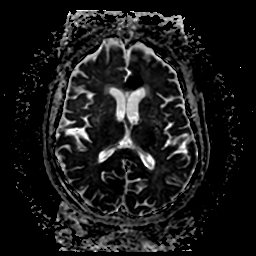
[im 33/47]
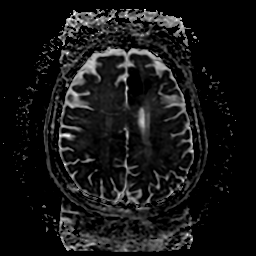
[im 40/47]
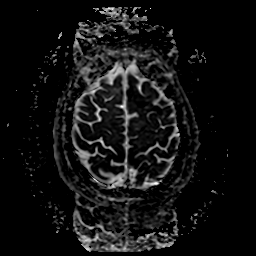
[im 47/47]
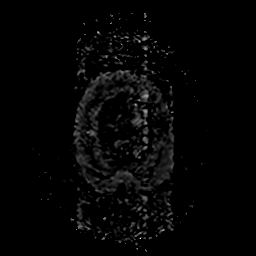

[Series 450: ADC · coronal · 4.0mm · 0.94mm/px · 6 of 34 slices shown (2 of 2)]
[im 1/34]
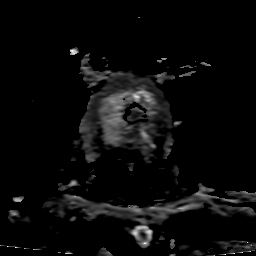
[im 7/34]
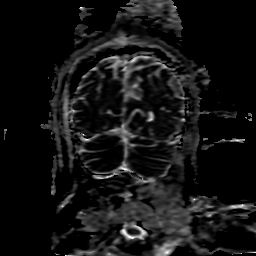
[im 14/34]
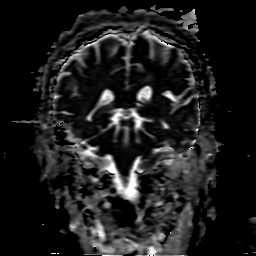
[im 20/34]
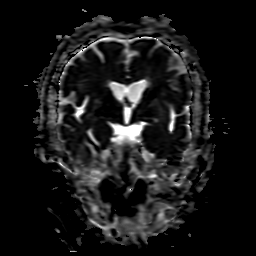
[im 27/34]
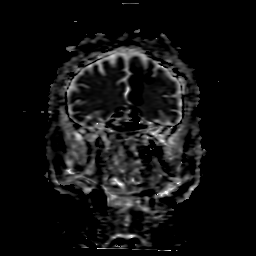
[im 34/34]
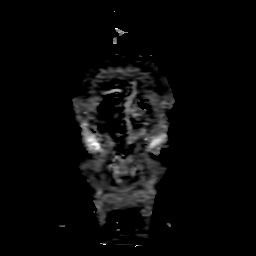

[48 of 48 positions shown; findings below may reference images not displayed]

FINDINGS: Brain: Acute infarct left ACA territory. This primarily involves the
medial frontal lobe on the left. Additional watershed infarct in the
left frontal parietal white matter.

Mild atrophy. Negative for hydrocephalus. Chronic microvascular
ischemic change in the white matter and pons. 1 cm rounded
hyperintensity left brachium pontis, indeterminate

Study limited to diffusion weighted imaging and FLAIR imaging per
Dr. JAYLON.
IMPRESSION: Acute infarct left ACA territory. Additional area of acute infarct
in the left watershed territory.

Moderate chronic microvascular ischemic change in the white matter
and pons

1 cm lesion left brachium pontis of indeterminate etiology. Possible
chronic infarct, demyelinating disease or less likely mass lesion.

## 2021-10-01 SURGERY — IR WITH ANESTHESIA
Anesthesia: General

## 2021-10-01 MED ORDER — EPTIFIBATIDE 20 MG/10ML IV SOLN
INTRAVENOUS | Status: AC
  Filled 2021-10-01: qty 10

## 2021-10-01 MED ORDER — LABETALOL HCL 5 MG/ML IV SOLN
INTRAVENOUS | Status: DC | PRN
  Administered 2021-10-01: 20 mg via INTRAVENOUS
  Administered 2021-10-01: 15 mg via INTRAVENOUS
  Administered 2021-10-01: 5 mg via INTRAVENOUS

## 2021-10-01 MED ORDER — TICAGRELOR 90 MG PO TABS
90.0000 mg | ORAL_TABLET | Freq: Two times a day (BID) | ORAL | Status: DC
  Administered 2021-10-02 – 2021-10-07 (×10): 90 mg via ORAL
  Filled 2021-10-01 (×12): qty 1

## 2021-10-01 MED ORDER — DEXAMETHASONE SODIUM PHOSPHATE 10 MG/ML IJ SOLN
INTRAMUSCULAR | Status: DC | PRN
  Administered 2021-10-01: 10 mg via INTRAVENOUS

## 2021-10-01 MED ORDER — SODIUM CHLORIDE 0.9 % IV SOLN: INTRAVENOUS | Status: DC

## 2021-10-01 MED ORDER — CLOPIDOGREL BISULFATE 75 MG PO TABS: 300.0000 mg | ORAL_TABLET | Freq: Once | ORAL | Status: DC

## 2021-10-01 MED ORDER — CLOPIDOGREL BISULFATE 300 MG PO TABS
ORAL_TABLET | ORAL | Status: AC
  Filled 2021-10-01: qty 1

## 2021-10-01 MED ORDER — SODIUM CHLORIDE 0.9 % IV SOLN
INTRAVENOUS | Status: AC | PRN
  Administered 2021-10-01: 2 ug/kg/min via INTRAVENOUS

## 2021-10-01 MED ORDER — FENTANYL CITRATE (PF) 250 MCG/5ML IJ SOLN
INTRAMUSCULAR | Status: DC | PRN
  Administered 2021-10-01: 100 ug via INTRAVENOUS

## 2021-10-01 MED ORDER — IOHEXOL 300 MG/ML  SOLN: 100.0000 mL | Freq: Once | INTRAMUSCULAR | Status: DC | PRN

## 2021-10-01 MED ORDER — CLEVIDIPINE BUTYRATE 0.5 MG/ML IV EMUL
INTRAVENOUS | Status: AC
  Filled 2021-10-01: qty 50

## 2021-10-01 MED ORDER — ACETAMINOPHEN 325 MG PO TABS: 650.0000 mg | ORAL_TABLET | ORAL | Status: DC | PRN

## 2021-10-01 MED ORDER — ROCURONIUM BROMIDE 10 MG/ML (PF) SYRINGE
PREFILLED_SYRINGE | INTRAVENOUS | Status: DC | PRN
  Administered 2021-10-01: 50 mg via INTRAVENOUS

## 2021-10-01 MED ORDER — SODIUM CHLORIDE 0.9 % IV SOLN: INTRAVENOUS | Status: DC | PRN

## 2021-10-01 MED ORDER — TICAGRELOR 90 MG PO TABS
180.0000 mg | ORAL_TABLET | Freq: Once | ORAL | Status: AC
  Administered 2021-10-01: 18:00:00 180 mg via ORAL
  Filled 2021-10-01: qty 2

## 2021-10-01 MED ORDER — ASPIRIN 81 MG PO CHEW: 324.0000 mg | CHEWABLE_TABLET | Freq: Once | ORAL | Status: DC

## 2021-10-01 MED ORDER — ASPIRIN 81 MG PO CHEW
CHEWABLE_TABLET | ORAL | Status: AC
  Filled 2021-10-01: qty 1

## 2021-10-01 MED ORDER — ACETAMINOPHEN 650 MG RE SUPP: 650.0000 mg | RECTAL | Status: DC | PRN

## 2021-10-01 MED ORDER — TICAGRELOR 90 MG PO TABS
90.0000 mg | ORAL_TABLET | Freq: Two times a day (BID) | ORAL | Status: DC
  Filled 2021-10-01 (×8): qty 1

## 2021-10-01 MED ORDER — TIROFIBAN HCL IN NACL 5-0.9 MG/100ML-% IV SOLN
INTRAVENOUS | Status: AC
  Filled 2021-10-01: qty 100

## 2021-10-01 MED ORDER — CANGRELOR BOLUS VIA INFUSION
INTRAVENOUS | Status: AC | PRN
  Administered 2021-10-01: 14:00:00 2313 ug via INTRAVENOUS

## 2021-10-01 MED ORDER — HYDRALAZINE HCL 20 MG/ML IJ SOLN
INTRAMUSCULAR | Status: DC | PRN
  Administered 2021-10-01 (×2): 10 mg via INTRAVENOUS

## 2021-10-01 MED ORDER — TICAGRELOR 90 MG PO TABS
ORAL_TABLET | ORAL | Status: AC
  Filled 2021-10-01: qty 2

## 2021-10-01 MED ORDER — TICAGRELOR 90 MG PO TABS: 180.0000 mg | ORAL_TABLET | Freq: Once | ORAL | Status: AC

## 2021-10-01 MED ORDER — CLEVIDIPINE BUTYRATE 0.5 MG/ML IV EMUL
0.0000 mg/h | INTRAVENOUS | Status: DC
  Administered 2021-10-01: 22:00:00 9 mg/h via INTRAVENOUS
  Administered 2021-10-01: 18:00:00 7 mg/h via INTRAVENOUS
  Administered 2021-10-01: 17:00:00 2 mg/h via INTRAVENOUS
  Administered 2021-10-02 (×2): 9 mg/h via INTRAVENOUS
  Administered 2021-10-02 (×2): 21 mg/h via INTRAVENOUS
  Administered 2021-10-02: 9 mg/h via INTRAVENOUS
  Administered 2021-10-02: 21 mg/h via INTRAVENOUS
  Administered 2021-10-02: 13 mg/h via INTRAVENOUS
  Administered 2021-10-02: 16 mg/h via INTRAVENOUS
  Filled 2021-10-01 (×4): qty 50
  Filled 2021-10-01: qty 100
  Filled 2021-10-01 (×3): qty 50
  Filled 2021-10-01 (×2): qty 100

## 2021-10-01 MED ORDER — VERAPAMIL HCL 2.5 MG/ML IV SOLN
INTRAVENOUS | Status: AC
  Filled 2021-10-01: qty 2

## 2021-10-01 MED ORDER — SUCCINYLCHOLINE CHLORIDE 200 MG/10ML IV SOSY
PREFILLED_SYRINGE | INTRAVENOUS | Status: DC | PRN
  Administered 2021-10-01: 120 mg via INTRAVENOUS

## 2021-10-01 MED ORDER — ONDANSETRON HCL 4 MG/2ML IJ SOLN
INTRAMUSCULAR | Status: DC | PRN
  Administered 2021-10-01: 4 mg via INTRAVENOUS

## 2021-10-01 MED ORDER — ONDANSETRON HCL 4 MG/2ML IJ SOLN: 4.0000 mg | Freq: Four times a day (QID) | INTRAMUSCULAR | Status: DC | PRN

## 2021-10-01 MED ORDER — ACETAMINOPHEN 160 MG/5ML PO SOLN: 650.0000 mg | ORAL | Status: DC | PRN

## 2021-10-01 MED ORDER — ASPIRIN 81 MG PO CHEW
81.0000 mg | CHEWABLE_TABLET | Freq: Every day | ORAL | Status: DC
  Administered 2021-10-01 – 2021-10-07 (×6): 81 mg via ORAL
  Filled 2021-10-01 (×7): qty 1

## 2021-10-01 MED ORDER — CANGRELOR TETRASODIUM 50 MG IV SOLR
INTRAVENOUS | Status: AC
  Filled 2021-10-01: qty 50

## 2021-10-01 MED ORDER — ASPIRIN 81 MG PO CHEW
81.0000 mg | CHEWABLE_TABLET | Freq: Every day | ORAL | Status: DC
  Administered 2021-10-05: 81 mg
  Filled 2021-10-01: qty 1

## 2021-10-01 MED ORDER — IOHEXOL 350 MG/ML SOLN
100.0000 mL | Freq: Once | INTRAVENOUS | Status: AC | PRN
  Administered 2021-10-01: 12:00:00 100 mL via INTRAVENOUS

## 2021-10-01 MED ORDER — VERAPAMIL HCL 2.5 MG/ML IV SOLN
INTRAVENOUS | Status: AC | PRN
  Administered 2021-10-01: 5 mg via INTRA_ARTERIAL

## 2021-10-01 MED ORDER — STROKE: EARLY STAGES OF RECOVERY BOOK
Freq: Once | Status: DC
  Filled 2021-10-01: qty 1

## 2021-10-01 MED ORDER — SUGAMMADEX SODIUM 200 MG/2ML IV SOLN
INTRAVENOUS | Status: DC | PRN
  Administered 2021-10-01: 200 mg via INTRAVENOUS

## 2021-10-01 MED ORDER — PROPOFOL 10 MG/ML IV BOLUS
INTRAVENOUS | Status: DC | PRN
  Administered 2021-10-01: 120 mg via INTRAVENOUS

## 2021-10-01 NOTE — Code Documentation (Addendum)
Stroke Response Nurse Documentation Code Documentation  Andrea Williamson is a 70 y.o. female arriving to [facility] via [facility] on 10/01/21 with past medical hx of smoker, tremors. On No antithrombotic. Code stroke was activated by EDP. Hypertensive with EMS and in ED. BP 213/105.   Patient from home where she was LKW at 1700 on 09/30/21 and now complaining of right leg weakness, expressive aphasia. Pt has had some weakness the past few days including yesterday when pt and husband were bowling. Pt's husband says she was normal around 1700 last evening and found her fallen out of bed this am.    On patient arrival labs drawn and patient cleared for CT by Dr. Karle Starch who activated the code stroke. Patient to CT with team. NIHSS 6, see documentation for details and code stroke times. Patient with left facial droop, right leg weakness, Expressive aphasia , and dysarthria  on exam. The following imaging was completed: CT, CTP. Patient is not a candidate for IV Thrombolytic due to outside the window. After returning to room MD called to get a stat MRI with concern for a blocked vessel and ACA infarct. MRI partially completed when MD confirmed ACA infarct. Code IR paged. Pt's husband signed consent at the bedside. Pt also nodded in agreement. Groin shaved, pulses marked, and pt taken into IR suite. 4N ICU aware of bed need.   Care/Plan: IR for thrombectomy then admit to Neuro ICU.    Bedside handoff with ED RN Thurmond Butts and IR RN.   Lakeem Rozo, Rande Brunt  Stroke Response RN

## 2021-10-01 NOTE — Transfer of Care (Signed)
Immediate Anesthesia Transfer of Care Note  Patient: Andrea Williamson  Procedure(s) Performed: IR WITH ANESTHESIA  Patient Location: PACU  Anesthesia Type:General  Level of Consciousness: awake and patient cooperative  Airway & Oxygen Therapy: Patient Spontanous Breathing  Post-op Assessment: Report given to RN, Post -op Vital signs reviewed and stable and moving only left extremities purposeful and to command. MD aware. Expressive aphasia  Post vital signs: Reviewed and stable  Last Vitals:  Vitals Value Taken Time  BP 151/92 10/01/21 1509  Temp    Pulse 76 10/01/21 1514  Resp 21 10/01/21 1514  SpO2 96 % 10/01/21 1514  Vitals shown include unvalidated device data.  Last Pain:  Vitals:   10/01/21 1129  TempSrc:   PainSc: 0-No pain         Complications: No notable events documented.

## 2021-10-01 NOTE — Anesthesia Preprocedure Evaluation (Signed)
Anesthesia Evaluation   Patient confused    Reviewed: Unable to perform ROS - Chart review onlyPreop documentation limited or incomplete due to emergent nature of procedure.  Airway Mallampati: III  TM Distance: >3 FB Neck ROM: Full    Dental   Pulmonary  Covid-19 Nucleic Acid Test Results Lab Results      Component                Value               Date                      SARSCOV2NAA              NEGATIVE            10/01/2021              breath sounds clear to auscultation       Cardiovascular  Rhythm:Regular     Neuro/Psych CVA, Residual Symptoms    GI/Hepatic   Endo/Other    Renal/GU Lab Results      Component                Value               Date                      CREATININE               0.70                10/01/2021                Musculoskeletal   Abdominal   Peds  Hematology Lab Results      Component                Value               Date                      WBC                      7.9                 10/01/2021                HGB                      15.0                10/01/2021                HCT                      44.0                10/01/2021                MCV                      95.7                10/01/2021                PLT  246                 10/01/2021           Lab Results      Component                Value               Date                      INR                      0.9                 10/01/2021              Anesthesia Other Findings   Reproductive/Obstetrics                             Anesthesia Physical Anesthesia Plan  ASA: 4 and emergent  Anesthesia Plan: General   Post-op Pain Management:    Induction: Intravenous, Rapid sequence and Cricoid pressure planned  PONV Risk Score and Plan: 3 and Ondansetron and Dexamethasone  Airway Management Planned: Oral ETT  Additional Equipment:   Intra-op Plan:    Post-operative Plan: Possible Post-op intubation/ventilation  Informed Consent:     History available from chart only and Only emergency history available  Plan Discussed with: CRNA and Anesthesiologist  Anesthesia Plan Comments:         Anesthesia Quick Evaluation

## 2021-10-01 NOTE — Anesthesia Procedure Notes (Signed)
Procedure Name: Intubation Date/Time: 10/01/2021 1:24 PM Performed by: Leonor Liv, CRNA Pre-anesthesia Checklist: Patient identified, Emergency Drugs available, Suction available and Patient being monitored Patient Re-evaluated:Patient Re-evaluated prior to induction Oxygen Delivery Method: Circle System Utilized Preoxygenation: Pre-oxygenation with 100% oxygen Induction Type: IV induction, Rapid sequence and Cricoid Pressure applied Laryngoscope Size: Mac and 3 Grade View: Grade I Tube type: Oral Tube size: 7.0 mm Number of attempts: 1 Airway Equipment and Method: Stylet and Oral airway Placement Confirmation: ETT inserted through vocal cords under direct vision, positive ETCO2 and breath sounds checked- equal and bilateral Secured at: 21 cm Tube secured with: Tape Dental Injury: Teeth and Oropharynx as per pre-operative assessment

## 2021-10-01 NOTE — Procedures (Signed)
INTERVENTIONAL NEURORADIOLOGY BRIEF POSTPROCEDURE NOTE  DIAGNOSTIC CEREBRAL ANGIOGRAM AND MECHANICAL THROMBECTOMY  Attending: Dr. Erven Colla de Sindy Messing  Assistant: None.  Diagnosis: Left A2/ACA occlusion  Access site: RCFA, 52F  Access closure: Perclose Prostyle  Anesthesia: GETA  Medication used: cangrelor IV bolus and drip.  Complications: Left A2 dissection.  Estimated blood loss: Minimal.  Specimen: None.  Findings: Occlusion of the left A2/ACA. Recanalization achieved after direct contact aspiration (TICI3). Follow up angiogram showed non flow limiting dissection. Delay images showed clot formation in the dissected segment. This segment was then stented with a 3 x 20 mm neuroform atlas stent. Excellent forward flow noted on follow-up angiograms. No hemorrhagic complication on flat panel head CT.    The patient tolerated the procedure well without incident or complication and is in stable condition.   PLAN:  - SBP 120-140 mmHg - Bed rest x 6hours  - Continue cangrelor drip and obtain head CT at 17:00. If no hemorrhagic complication, load patient with 180 mg ticagrelor and 81 mg ASA - overlap with cangrelor for the remaining of the cangrelor drip bag.  - In case of hemorrhagic complication on CT, please page me at [phone].

## 2021-10-01 NOTE — Anesthesia Postprocedure Evaluation (Signed)
Anesthesia Post Note  Patient: Andrea Williamson  Procedure(s) Performed: IR WITH ANESTHESIA     Patient location during evaluation: PACU Anesthesia Type: General Level of consciousness: awake and patient cooperative Pain management: pain level controlled Vital Signs Assessment: post-procedure vital signs reviewed and stable Respiratory status: spontaneous breathing, nonlabored ventilation, respiratory function stable and patient connected to nasal cannula oxygen Cardiovascular status: stable Postop Assessment: no apparent nausea or vomiting Anesthetic complications: no Comments: Residual right weakness   No notable events documented.  Last Vitals:  Vitals:   10/01/21 1530 10/01/21 1545  BP: 133/83 140/70  Pulse: 74 74  Resp: 20 18  Temp:    SpO2: 96% 96%    Last Pain:  Vitals:   10/01/21 1545  TempSrc:   PainSc: 0-No pain    LLE Motor Response: Purposeful movement (10/01/21 1545) LLE Sensation: Full sensation (10/01/21 1545) RLE Motor Response: Other (Comment);No movement to painful stimulus (10/01/21 1545) RLE Sensation: Decreased (10/01/21 1545)      [name]

## 2021-10-01 NOTE — ED Provider Notes (Signed)
[facility] EMERGENCY DEPARTMENT Provider Note  CSN: [id number] Arrival date & time: 10/01/21 1120    History Chief Complaint  Patient presents with   Seizures    Andrea Williamson is a 70 y.o. female with no prior visits to this facility and limited history available in [place] brought from home for possible seizure. Per EMS husband reported she was fine yesterday afternoon, they went bowling yesterday where she fell but did not hit her head. She had some shaking of her R arm this morning. Husband is not present and no valid phone number available. She is unable to provide any history. LSN reported to be 5pm yesterday but cannot confirm that. Code Stroke initiated. Level 5 caveat applies.    No past medical history on file.    No family history on file.      Home Medications Prior to Admission medications   Not on File     Allergies    Patient has no known allergies.   Review of Systems   Review of Systems Unable to assess due to mental status.    Physical Exam BP (!) 207/119   Pulse 68   Temp 97.7 F (36.5 C) (Oral)   Resp 17   Ht 5\' 9"  (1.753 m)   Wt 77.1 kg   SpO2 98%   BMI 25.10 kg/m   Physical Exam Vitals and nursing note reviewed.  Constitutional:      Appearance: Normal appearance.  HENT:     Head: Normocephalic and atraumatic.     Nose: Nose normal.     Mouth/Throat:     Mouth: Mucous membranes are moist.  Eyes:     Extraocular Movements: Extraocular movements intact.     Conjunctiva/sclera: Conjunctivae normal.  Cardiovascular:     Rate and Rhythm: Normal rate.  Pulmonary:     Effort: Pulmonary effort is normal.     Breath sounds: Normal breath sounds.  Abdominal:     General: Abdomen is flat.     Palpations: Abdomen is soft.     Tenderness: There is no abdominal tenderness.  Musculoskeletal:        General: No swelling. Normal range of motion.     Cervical back: Neck supple.  Skin:    General: Skin is warm and dry.   Neurological:     Mental Status: She is alert.     Comments: Confused, no facial droop, R leg weakness, normal strength in BUE and LLE, resting tremor  Psychiatric:        Mood and Affect: Mood normal.     ED Results / Procedures / Treatments   Labs (all labs ordered are listed, but only abnormal results are displayed) Labs Reviewed  CBC - Abnormal; Notable for the following components:      Result Value   Hemoglobin 15.2 (*)    All other components within normal limits  COMPREHENSIVE METABOLIC PANEL - Abnormal; Notable for the following components:   Sodium 134 (*)    CO2 21 (*)    Glucose, Bld 125 (*)    All other components within normal limits  I-STAT CHEM 8, ED - Abnormal; Notable for the following components:   Glucose, Bld 114 (*)    All other components within normal limits  CBG MONITORING, ED - Abnormal; Notable for the following components:   Glucose-Capillary 119 (*)    All other components within normal limits  RESP PANEL BY RT-PCR (FLU A&B, COVID) ARPGX2  ETHANOL  PROTIME-INR  APTT  DIFFERENTIAL  RAPID URINE DRUG SCREEN, HOSP PERFORMED  URINALYSIS, ROUTINE W REFLEX MICROSCOPIC  HIV ANTIBODY (ROUTINE TESTING W REFLEX)    EKG EKG Interpretation  Date/Time:  Thursday October 01 2021 11:22:39 EST Ventricular Rate:  68 PR Interval:  87 QRS Duration: 86 QT Interval:  443 QTC Calculation: 472 R Axis:   -14 Text Interpretation: Sinus rhythm Short PR interval Consider left ventricular hypertrophy No old tracing to compare Confirmed by Calvert Cantor [phone]) on 10/01/2021 12:03:06 PM  Radiology MR BRAIN WO CONTRAST  Result Date: 10/01/2021 CLINICAL DATA:  Acute neuro deficit. EXAM: MRI HEAD WITHOUT CONTRAST TECHNIQUE: Multiplanar, multiecho pulse sequences of the brain and surrounding structures were obtained without intravenous contrast. COMPARISON:  CT head 10/01/2021 FINDINGS: Brain: Acute infarct left ACA territory. This primarily involves the medial  frontal lobe on the left. Additional watershed infarct in the left frontal parietal white matter. Mild atrophy. Negative for hydrocephalus. Chronic microvascular ischemic change in the white matter and pons. 1 cm rounded hyperintensity left brachium pontis, indeterminate Study limited to diffusion weighted imaging and FLAIR imaging per Dr. Leonel Ramsay. IMPRESSION: Acute infarct left ACA territory. Additional area of acute infarct in the left watershed territory. Moderate chronic microvascular ischemic change in the white matter and pons 1 cm lesion left brachium pontis of indeterminate etiology. Possible chronic infarct, demyelinating disease or less likely mass lesion. Electronically Signed   By: Franchot Gallo M.D.   On: 10/01/2021 13:16   CT HEAD CODE STROKE WO CONTRAST  Result Date: 10/01/2021 CLINICAL DATA:  Code stroke. Neuro deficit, acute, stroke suspected. EXAM: CT HEAD WITHOUT CONTRAST TECHNIQUE: Contiguous axial images were obtained from the base of the skull through the vertex without intravenous contrast. COMPARISON:  None. FINDINGS: Brain: No focal abnormality affects the brainstem or cerebellum. There is loss of gray-white differentiation in the left frontal lobe suggesting left anterior cerebral artery territory infarction. There is an old small vessel infarction in the left basal ganglia. No mass, hemorrhage, hydrocephalus or extra-axial collection. Vascular: There is atherosclerotic calcification of the major vessels at the base of the brain. Skull: Negative Sinuses/Orbits: Clear/normal Other: None ASPECTS ([place] [facility] Stroke Program Early CT Score) - Ganglionic level infarction (caudate, lentiform nuclei, internal capsule, insula, M1-M3 cortex): 7 - Supraganglionic infarction (M4-M6 cortex): 3 Total score (0-10 with 10 being normal): 10 IMPRESSION: 1. 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 Loss of gray-white differentiation. No hemorrhage. 2. ASPECTS is 10  3. These results were communicated to Dr. Leonel Ramsay at 12:02 pm on 10/01/2021 by text page via the [name] messaging system. Electronically Signed   By: Nelson Chimes M.D.   On: 10/01/2021 12:03   CT ANGIO HEAD NECK W WO CM W PERF (CODE STROKE)  Result Date: 10/01/2021 CLINICAL DATA:  Code stroke EXAM: CT ANGIOGRAPHY HEAD AND NECK CT PERFUSION BRAIN TECHNIQUE: Multidetector CT imaging of the head and neck was performed using the standard protocol during bolus administration of intravenous contrast. Multiplanar CT image reconstructions and MIPs were obtained to evaluate the vascular anatomy. Carotid stenosis measurements (when applicable) are obtained utilizing NASCET criteria, using the distal internal carotid diameter as the denominator. Multiphase CT imaging of the brain was performed following IV bolus contrast injection. Subsequent parametric perfusion maps were calculated using RAPID software. CONTRAST:  156mL OMNIPAQUE IOHEXOL 350 MG/ML SOLN COMPARISON:  Same-day noncontrast CT head FINDINGS: CTA NECK FINDINGS Aortic arch: There is minimal calcified atherosclerotic plaque of the aortic arch. Right carotid system: The  right common carotid artery has a medialized course. There is minimal calcified atherosclerotic plaque in the proximal right internal carotid artery without hemodynamically significant stenosis or occlusion. There is no dissection or aneurysm. Left carotid system: The left common carotid artery has a medialized course. There is mild calcified atherosclerotic plaque in the left carotid bulb without hemodynamically significant stenosis or occlusion. There is no dissection or aneurysm. Vertebral arteries: The left vertebral artery is dominant, a normal variant. The vertebral arteries are patent in the neck, with no hemodynamically significant stenosis, occlusion, dissection, or aneurysm. Skeleton: There is multilevel degenerative change of the cervical spine. There is no acute osseous abnormality  or aggressive osseous lesion. Other neck: There is complete opacification of the left mastoid air cells and middle ear cavity. There is no definite evidence of ossicular erosion. No definite nasopharyngeal mass is identified. The soft tissues are unremarkable. Upper chest: There is scarring in the left lung apex. Review of the MIP images confirms the above findings CTA HEAD FINDINGS Anterior circulation: There is mild calcified atherosclerotic plaque in the bilateral intracranial ICAs without hemodynamically significant stenosis. The bilateral MCAs are patent. There is mild irregularity on the left likely reflecting atherosclerotic disease without hemodynamically significant stenosis. The proximal ACAs are patent. There is focal occlusion of the left A2 segment (9-94, 8-71). There is reconstitution of flow distally. The right ACA is patent with moderate focal stenosis in the proximal A2 segment (8-81). There is no aneurysm. Posterior circulation: The right V4 segment is diminutive with a PICA termination. The left V4 segment is patent. The basilar artery is patent. The bilateral PCAs are patent. The right PCA is predominantly fed by a prominent right posterior communicating artery. The left posterior communicating artery is not definitely identified. There is no aneurysm. Venous sinuses: As permitted by contrast timing, patent. Anatomic variants: As above. Review of the MIP images confirms the above findings CT Brain Perfusion Findings: ASPECTS: 10 CBF (<30%) Volume: 34mL Perfusion (Tmax>6.0s) volume: 34mL Mismatch Volume: 44mL Infarction Location:No infarct identified. Ischemia is identified in the left ACA territory. IMPRESSION: 1. Focal occlusion of the left A2 segment with distal reconstitution. 2. 27 cc penumbra in the left ACA distribution. No core infarction identified. 3. Moderate focal stenosis of the proximal right A2 segment. Otherwise, no hemodynamically significant stenosis in the vasculature of the head  or neck. 4. Complete opacification of the left mastoid air cells and middle ear cavity. No definite obstructing nasopharyngeal mass is seen. These results were called by telephone at the time of interpretation on 10/01/2021 at 12:30 pm to provider [facility] , who verbally acknowledged these results. Electronically Signed   By: Valetta Mole M.D.   On: 10/01/2021 12:32    Procedures .Critical Care Performed by: Truddie Hidden, MD Authorized by: Truddie Hidden, MD   Critical care provider statement:    Critical care time (minutes):  45   Critical care time was exclusive of:  Separately billable procedures and treating other patients   Critical care was necessary to treat or prevent imminent or life-threatening deterioration of the following conditions:  CNS failure or compromise   Critical care was time spent personally by me on the following activities:  Development of treatment plan with patient or surrogate, discussions with consultants, evaluation of patient's response to treatment, examination of patient, ordering and review of laboratory studies, ordering and review of radiographic studies, ordering and performing treatments and interventions, pulse oximetry, re-evaluation of patient's condition, review of old charts  and obtaining history from patient or surrogate  Medications Ordered in the ED Medications  aspirin chewable tablet 324 mg (0 mg Oral Hold 10/01/21 1239)  clopidogrel (PLAVIX) tablet 300 mg (300 mg Oral Not Given 10/01/21 1240)  aspirin 81 MG chewable tablet (has no administration in time range)  ticagrelor (BRILINTA) 90 MG tablet (has no administration in time range)  clopidogrel (PLAVIX) 300 MG tablet (has no administration in time range)  cangrelor (KENGREAL) 50 MG SOLR (has no administration in time range)  tirofiban (AGGRASTAT) 5-0.9 MG/100ML-% injection (has no administration in time range)  eptifibatide (INTEGRILIN) 20 MG/10ML injection (has no  administration in time range)   stroke: mapping our early stages of recovery book (has no administration in time range)  0.9 %  sodium chloride infusion (has no administration in time range)  acetaminophen (TYLENOL) tablet 650 mg (has no administration in time range)    Or  acetaminophen (TYLENOL) 160 MG/5ML solution 650 mg (has no administration in time range)    Or  acetaminophen (TYLENOL) suppository 650 mg (has no administration in time range)  iohexol (OMNIPAQUE) 300 MG/ML solution 100 mL (has no administration in time range)  verapamil (ISOPTIN) injection (5 mg Intra-arterial Given 10/01/21 1351)  cangrelor (KENGREAL) bolus via infusion (2,313 mcg Intravenous Given 10/01/21 1410)  cangrelor (KENGREAL) 50,000 mcg in sodium chloride 0.9 % 250 mL (200 mcg/mL) infusion (2 mcg/kg/min  77.1 kg Intravenous New Bag/Given 10/01/21 1411)  iohexol (OMNIPAQUE) 350 MG/ML injection 100 mL (100 mLs Intravenous Contrast Given 10/01/21 1204)     MDM Rules/Calculators/A&P MDM Code Stroke initiated due to R leg weakness, confusion and inability to confirm LSN.   ED Course  I have reviewed the triage vital signs and the nursing notes.  Pertinent labs & imaging results that were available during my care of the patient were reviewed by me and considered in my medical decision making (see chart for details).  Clinical Course as of 10/01/21 1423  Thu Oct 01, 2021  1159 Neurology team promptly to bedside. Husband is here now, he has had a laryngectomy and communicated only by writing. He states they went bowling last night and she was 'unsteady' then. She normally talks normally. Taken emergently to CT.  [CS]  1202 CBC and I-stat chem unremarkable.  [CS]  1211 CT shows signs of acute ACA territory infarct.  [CS]  4656 CTA/P confirms A2 branch occlusion. Dr. Leonel Ramsay will take to MRI emergently, may still be a candidate for thrombectomy.  [CS]  8127 CMP unremarkable.  [CS]  5170 Patient taken  directly from MRI to IR for thrombectomy. Patient will be admitted to Neurology service from there.  [CS]    Clinical Course User Index [CS] Truddie Hidden, MD    Final Clinical Impression(s) / ED Diagnoses Final diagnoses:  Acute stroke due to ischemia [facility])    Rx / DC Orders ED Discharge Orders     None        Truddie Hidden, MD 10/01/21 1424

## 2021-10-01 NOTE — H&P (Signed)
Neurology H&P  CC: Leg weakness  History is obtained from: Patient, husband  HPI: Andrea Williamson is a 70 y.o. female with a history of hyperlipidemia who presents with right leg weakness and aphasia.  She states that she first noticed some trouble with her right leg 2 days ago, but it was mild and she did not think much of it.  Yesterday, when he went bowling, she was slightly unsteady, but had not noticed any trouble with speech.  This morning her husband found her on the floor and she states that her leg was no longer supporting her.  She was also having significant trouble with her speech and therefore she was brought into the emergency department where a code stroke was activated.  She was taken for CT/CTA/CTP.  Initially, she was not felt to be an interventional candidate due to the time that she had had symptoms, but given that she was continuing to wax and wane while in the emergency department as well has a large area of penumbra was present on CTP that was not infarcted by MRI, after discussion with the husband and the patient she was taken for thrombectomy.   LKW: 2 days ago tpa given?: No, out of window IR Thrombectomy?  Yes Modified Rankin Scale: 0-Completely asymptomatic and back to baseline post- stroke NIHSS:    ROS:  Unable to obtain due to altered mental status.   Past medical history: Hyperlipidemia   Family history: Unable to obtain due to altered mental status.  Social History: She smokes, no alcohol   Prior to Admission medications   Not on File     Exam: Current vital signs: BP (!) 207/119   Pulse 68   Temp 97.7 F (36.5 C) (Oral)   Resp 17   Ht 5\' 9"  (1.753 m)   Wt 77.1 kg   SpO2 98%   BMI 25.10 kg/m    Physical Exam  Constitutional: Appears well-developed and well-nourished.  Psych: Affect appropriate to situation Eyes: No scleral injection HENT: No OP obstrucion Head: Normocephalic.  Cardiovascular: Normal rate and regular rhythm.   Respiratory: Effort normal and breath sounds normal to anterior ascultation GI: Soft.  No distension. There is no tenderness.  Skin: WDI  Neuro: Mental Status: Waxes and wanes over the course of my examination, sometimes seeming completely fluent, and other times having a fairly severe expressive aphasia. Cranial Nerves: II: Visual Fields are full. Pupils are equal, round, and reactive to light.   III,IV, VI: EOMI without ptosis or diploplia.  V: Facial sensation is symmetric to temperature VII: Facial movement with very mild right facial  VIII: hearing is intact to voice X: Uvula elevates symmetrically XI: Shoulder shrug is symmetric. XII: tongue is midline without atrophy or fasciculations.  Motor: Tone is normal. Bulk is normal. 5/5 strength was present in bilateral upper extremities, 2-3/5 in right lower extremity, 5/5 in left lower extremity Sensory: Sensation is mildly diminished in right lower extremity Cerebellar: No clear ataxia on finger-nose-finger  I have reviewed labs in epic and the pertinent results are: Mild hyponatremia at 134, otherwise BMP unremarkable CBC is unremarkable.  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   Secondary Diagnosis: Hyponatremia   Impression: 70 year old female with left ACA infarction who underwent thrombectomy after evaluation with CTA/CTP/MRI.  There was some delay given the unusual presentation with prolonged  symptoms followed by continued waxing/waning and incomplete infarction on MRI as described in the HPI.  She will need further work-up for secondary risk factor modification as well as physical therapy.  Plan: - HgbA1c, fasting lipid panel - MRI of the brain without contrast - Frequent neuro checks - Echocardiogram - CTA head and neck - Prophylactic  therapy-Antiplatelet med: Aspirin - dose 81mg  and Brilinta 90 mg twice daily - Risk factor modification - Telemetry monitoring - PT consult, OT consult, Speech consult - Stroke team to follow  This patient is critically ill and at significant risk of neurological worsening, death and care requires constant monitoring of vital signs, hemodynamics,respiratory and cardiac monitoring, neurological assessment, discussion with family, other specialists and medical decision making of high complexity. I spent 90 minutes of neurocritical care time  in the care of  this patient. This was time spent independent of any time provided by nurse practitioner or PA.  Roland Rack, MD [facility] [phone]  If 7pm- 7am, please page neurology on call as listed in [place].

## 2021-10-01 NOTE — ED Triage Notes (Signed)
Pt BIB [facility] from home. Complaint of possible seizure per husband. Husband reports fine last night at 1700, had fall while bowling. No LOC. Today presents with increased shaking, right leg weakness and slurred speech. Pt initially hypertensive with EMS. A&Ox4

## 2021-10-02 ENCOUNTER — Inpatient Hospital Stay (HOSPITAL_COMMUNITY): Payer: Medicare Other

## 2021-10-02 ENCOUNTER — Encounter (HOSPITAL_COMMUNITY): Payer: Self-pay | Admitting: Radiology

## 2021-10-02 DIAGNOSIS — I63522 Cerebral infarction due to unspecified occlusion or stenosis of left anterior cerebral artery: Secondary | ICD-10-CM | POA: Diagnosis not present

## 2021-10-02 DIAGNOSIS — I6389 Other cerebral infarction: Secondary | ICD-10-CM | POA: Diagnosis not present

## 2021-10-02 DIAGNOSIS — F17200 Nicotine dependence, unspecified, uncomplicated: Secondary | ICD-10-CM | POA: Diagnosis not present

## 2021-10-02 DIAGNOSIS — E782 Mixed hyperlipidemia: Secondary | ICD-10-CM

## 2021-10-02 DIAGNOSIS — I639 Cerebral infarction, unspecified: Secondary | ICD-10-CM | POA: Diagnosis not present

## 2021-10-02 LAB — ECHOCARDIOGRAM COMPLETE
AR max vel: 2.28 cm2
AV Area VTI: 2.43 cm2
AV Area mean vel: 2.38 cm2
AV Mean grad: 10 mmHg
AV Peak grad: 17.5 mmHg
Ao pk vel: 2.09 m/s
Area-P 1/2: 3.91 cm2
Height: 63 in
S' Lateral: 3.5 cm
Weight: 2585.55 oz

## 2021-10-02 LAB — BASIC METABOLIC PANEL
Anion gap: 7 (ref 5–15)
BUN: 17 mg/dL (ref 8–23)
CO2: 20 mmol/L — ABNORMAL LOW (ref 22–32)
Calcium: 8.6 mg/dL — ABNORMAL LOW (ref 8.9–10.3)
Chloride: 108 mmol/L (ref 98–111)
Creatinine, Ser: 0.86 mg/dL (ref 0.44–1.00)
GFR, Estimated: >60 mL/min (ref >60)
Glucose, Bld: 128 mg/dL — ABNORMAL HIGH (ref 70–99)
Potassium: 3.7 mmol/L (ref 3.5–5.1)
Sodium: 135 mmol/L (ref 135–145)

## 2021-10-02 LAB — CBC
HCT: 40 % (ref 36.0–46.0)
Hemoglobin: 13.5 g/dL (ref 12.0–15.0)
MCH: 32.4 pg (ref 26.0–34.0)
MCHC: 33.8 g/dL (ref 30.0–36.0)
MCV: 95.9 fL (ref 80.0–100.0)
Platelets: 274 10*3/uL (ref 150–400)
RBC: 4.17 MIL/uL (ref 3.87–5.11)
RDW: 13.1 % (ref 11.5–15.5)
WBC: 14.7 10*3/uL — ABNORMAL HIGH (ref 4.0–10.5)
nRBC: 0 % (ref 0.0–0.2)

## 2021-10-02 LAB — LIPID PANEL
Cholesterol: 317 mg/dL — ABNORMAL HIGH (ref 0–200)
HDL: 49 mg/dL (ref >40)
LDL Cholesterol: 232 mg/dL — ABNORMAL HIGH (ref 0–99)
Total CHOL/HDL Ratio: 6.5 RATIO
Triglycerides: 182 mg/dL — ABNORMAL HIGH (ref <150)
VLDL: 36 mg/dL (ref 0–40)

## 2021-10-02 LAB — HIV ANTIBODY (ROUTINE TESTING W REFLEX): HIV Screen 4th Generation wRfx: NONREACTIVE

## 2021-10-02 LAB — HEMOGLOBIN A1C
Hgb A1c MFr Bld: 5 % (ref 4.8–5.6)
Mean Plasma Glucose: 96.8 mg/dL

## 2021-10-02 IMAGING — MR MR MRA HEAD W/O CM
2 series · 17 of 48 positions shown · non-contrast
Comparison: MRI [DATE], correlation is also made with CT head
[DATE] [DATE] a.m. and [DATE] p.m.

CLINICAL DATA: Stroke, follow-up

EXAM:
MRI HEAD WITHOUT CONTRAST
MRA HEAD WITHOUT CONTRAST
TECHNIQUE: Multiplanar, multi-echo pulse sequences of the brain and surrounding
structures were acquired without intravenous contrast. Angiographic
images of the Circle of Willis were acquired using MRA technique
without intravenous contrast.

[Series 3: (id) mt fs · axial · 1.4mm · 0.43mm/px · z∈[-137,-30]mm · 16 of 160 slices shown]
[im 1/160]
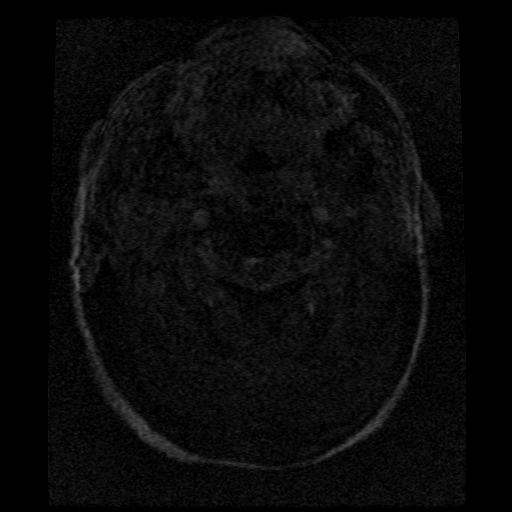
[im 4/160]
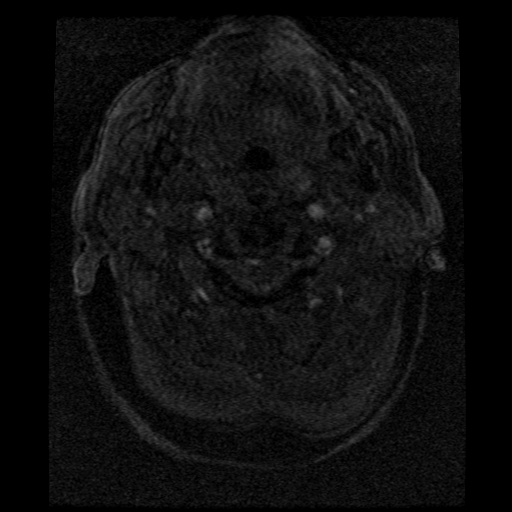
[im 7/160]
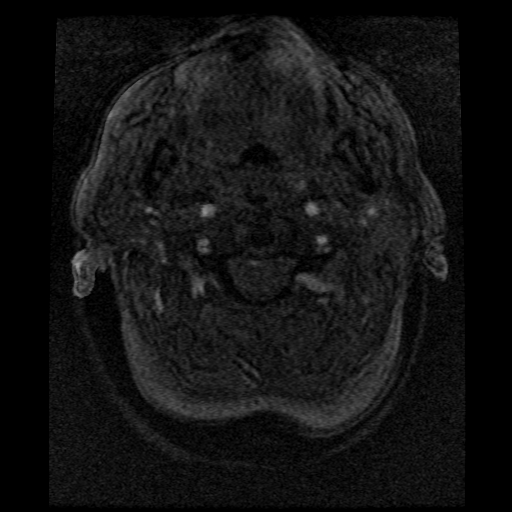
[im 11/160]
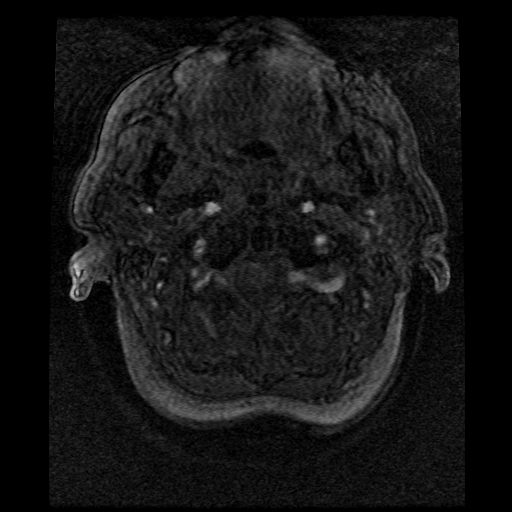
[im 14/160]
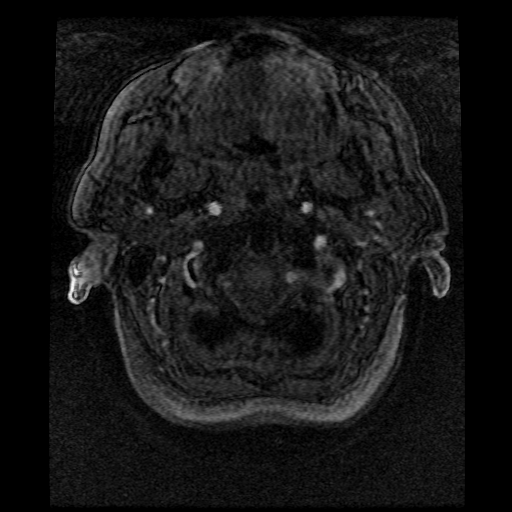
[im 18/160]
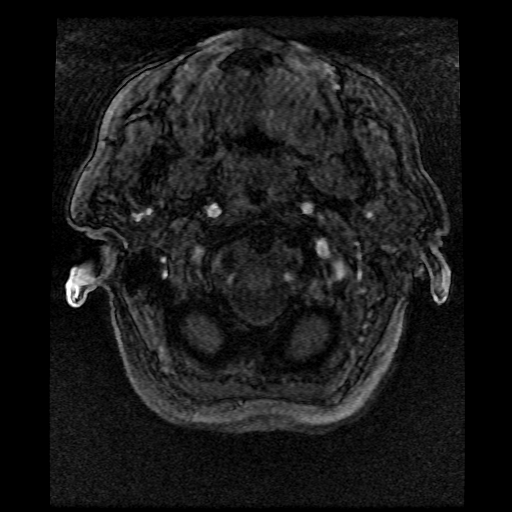
[im 25/160]
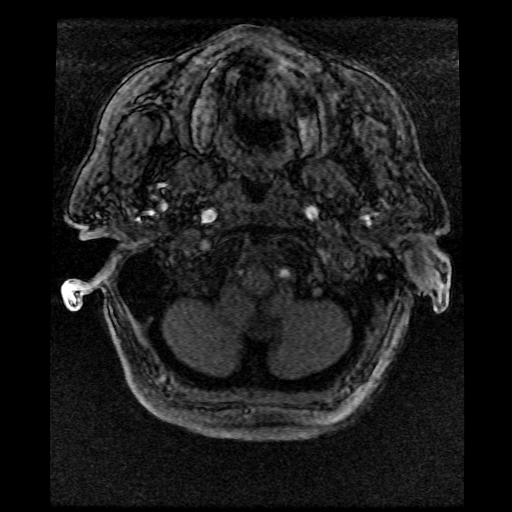
[im 28/160]
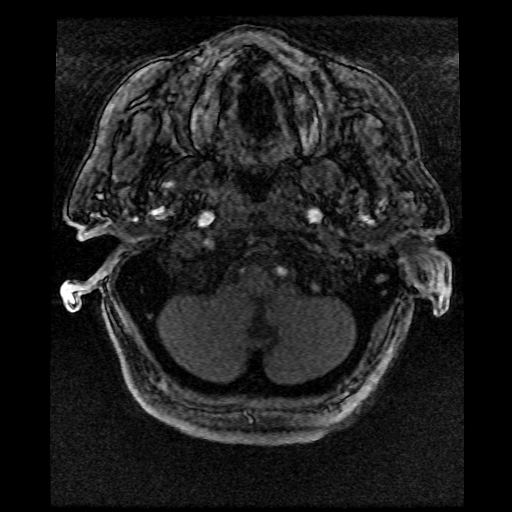
[im 49/160]
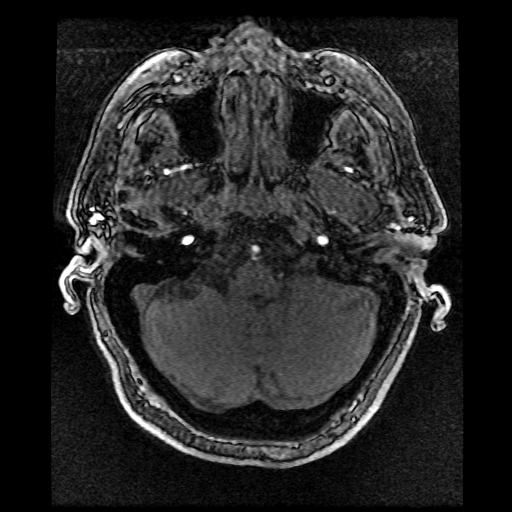
[im 70/160]
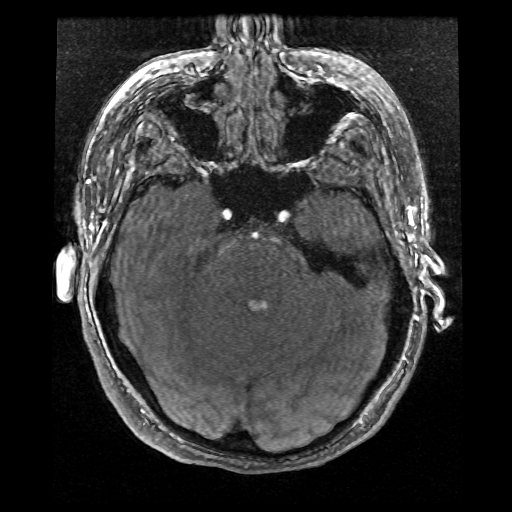
[im 80/160]
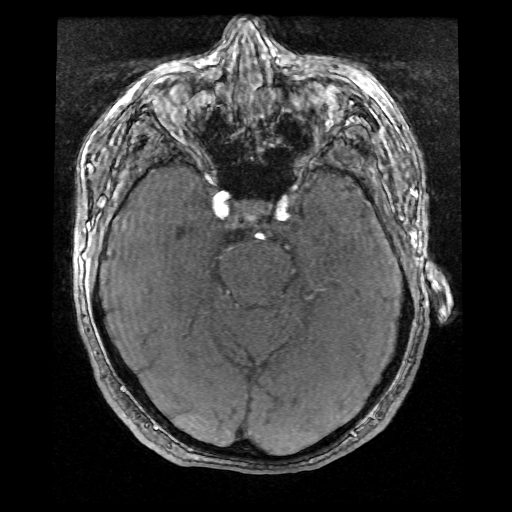
[im 90/160]
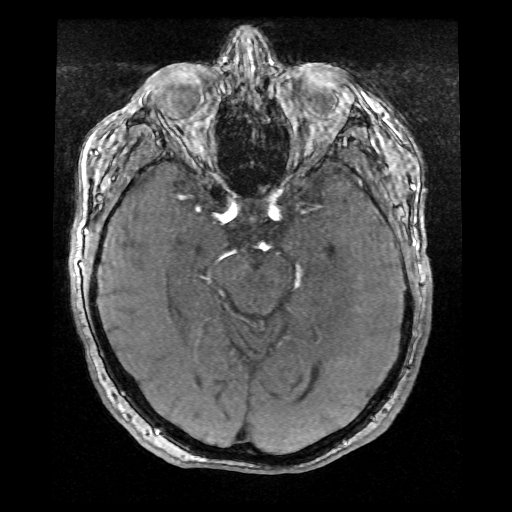
[im 111/160]
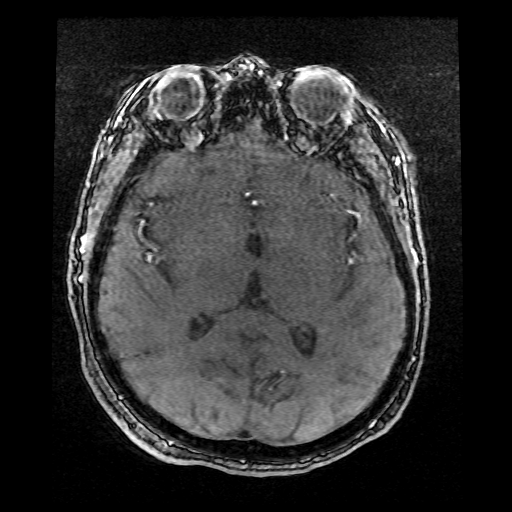
[im 132/160]
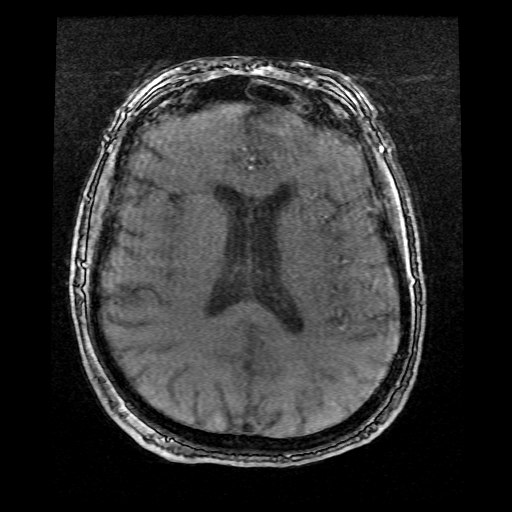
[im 135/160]
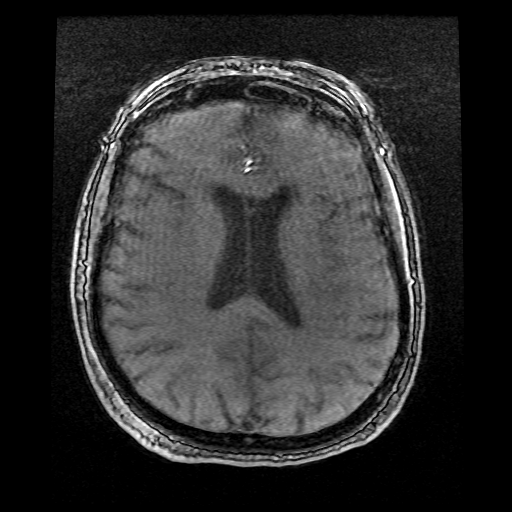
[im 153/160]
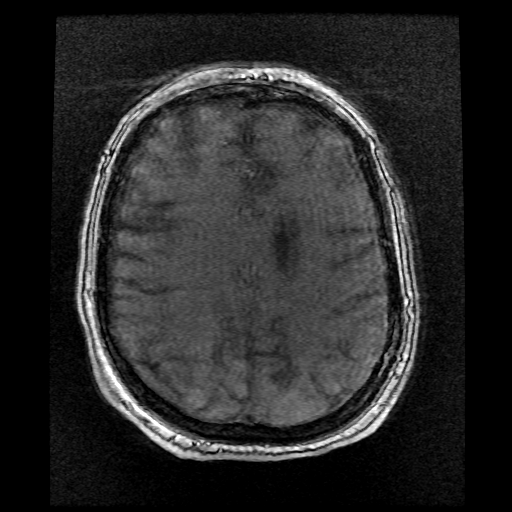

[Series 300: col:(id) mt fs · axial · 1.4mm · 0.43mm/px · 1 of 1 slices shown]
[im 1/1]
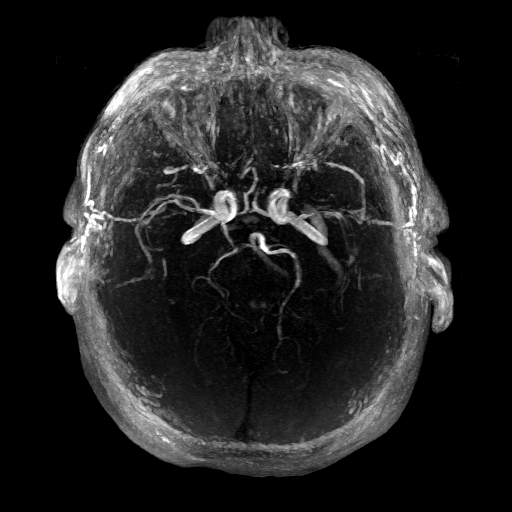

[17 of 48 positions shown; findings below may reference images not displayed]

FINDINGS: MRI HEAD FINDINGS

Evaluation is somewhat limited by motion artifact.

Brain: Redemonstrated areas of restricted diffusion in the medial
left frontal and parietal lobes, in both the left ACA territory and
watershed territory. Additional small area of restricted diffusion
in the left inferior cerebellum (series 3, image 12), unchanged. The
majority of the infarcts were seen on the prior exam, with the
exception of a small area of restricted diffusion in the parafalcine
right parietal lobe (series 3, image 37). Many of these areas are
not associated with increased T2 signal, and there is susceptibility
in the area of the left parafalcine frontal lobe (series 7, image
17), likely small amount of associated hemorrhage. Additional
susceptibility is noted in the area of the left A2, corresponding to
a stent.

No mass, mass, or midline shift.

Vascular: Please see MRA findings below.

Skull and upper cervical spine: Normal marrow signal.

Sinuses/Orbits: Mucosal thickening in the left frontal sinus. The
orbits are unremarkable.

Other: Fluid in left mastoid air cells.

MRA HEAD FINDINGS

Evaluation is somewhat limited by motion artifact.

Anterior circulation: Both internal carotid arteries are patent to
the termini, without stenosis or other abnormality. A1 segments
patent. Normal anterior communicating artery. Area of susceptibility
in the course of the left A2, secondary to stent placement. Anterior
cerebral arteries are otherwise patent to their distal aspects,
although evaluation is limited by both motion and susceptibility. No
M1 stenosis or occlusion; irregularity in the left M1 is felt to be
secondary to motion. Evaluation of the bifurcations and distal MCA
branches is limited by motion, however these grossly appear patent.

Posterior circulation: Diminutive right vertebral artery that
terminates primarily in PICA. Patent left vertebral artery. The
basilar artery is patent. The superior cerebellar arteries are
unremarkable. Fetal origin of the right PCA, with a patent posterior
communicating artery and no definite visualization of a right P1.
Possible narrowing of the proximal right P2, although evaluation is
limited by motion. The left PCA appears patent. The left posterior
communicating artery is not definitively visualized.

Anatomic variants: None significant
IMPRESSION: 1. Redemonstrated infarcts in the left ACA and ACA/PCA watershed
territory, with one new area of restricted diffusion in the medial
right parietal lobe, likely an additional acute infarct, status post
A2 stenting. A small amount of associated petechial hemorrhage is
noted within the medial left frontal lobe, without significant
hemorrhagic transformation.
2. Evaluation of the vasculature is limited by motion. Within this
limitation, there is no large vessel occlusion or significant
stenosis. Evaluation of the left A2 is limited by both motion and
susceptibility artifact from the stent. If further evaluation of the
vasculature is indicated, consider CTA.

## 2021-10-02 IMAGING — MR MR HEAD W/O CM
9 of 12 series · 32 of 48 positions shown · non-contrast
Comparison: MRI [DATE], correlation is also made with CT head
[DATE] [DATE] a.m. and [DATE] p.m.

CLINICAL DATA: Stroke, follow-up

EXAM:
MRI HEAD WITHOUT CONTRAST
MRA HEAD WITHOUT CONTRAST
TECHNIQUE: Multiplanar, multi-echo pulse sequences of the brain and surrounding
structures were acquired without intravenous contrast. Angiographic
images of the Circle of Willis were acquired using MRA technique
without intravenous contrast.

[Series 3: DWI · axial · 3.0mm · 1.09mm/px · z∈[-139,+7]mm · 8 of 100 slices shown (1 of 4)]
[im 1/100]
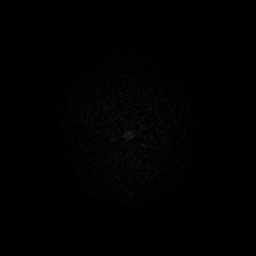
[im 12/100]
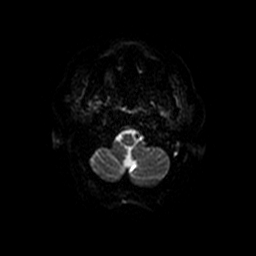
[im 34/100]
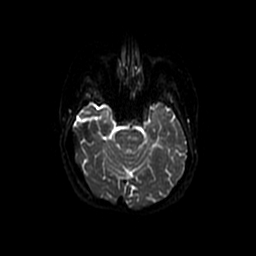
[im 45/100]
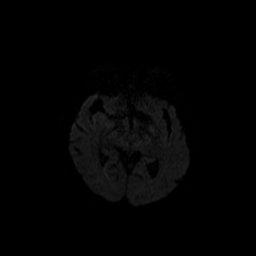
[im 56/100]
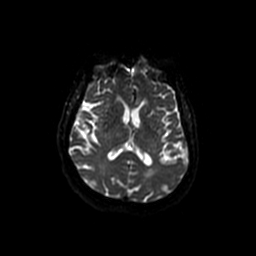
[im 67/100]
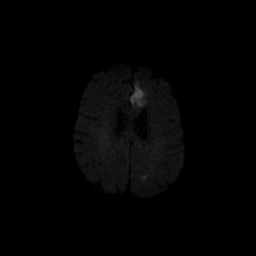
[im 89/100]
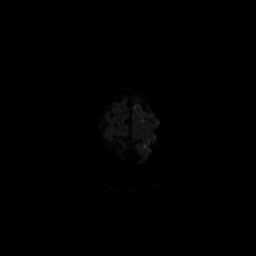
[im 100/100]
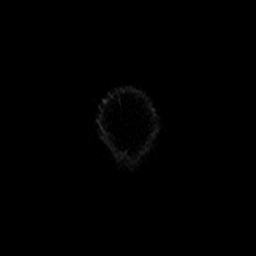

[Series 4: DWI · coronal · 5.0mm · 1.09mm/px · 6 of 66 slices shown (2 of 4)]
[im 1/66]
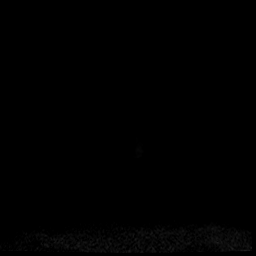
[im 14/66]
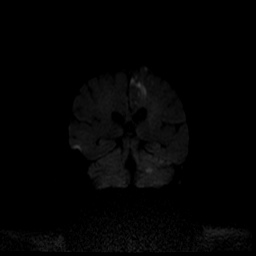
[im 27/66]
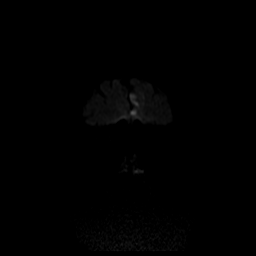
[im 40/66]
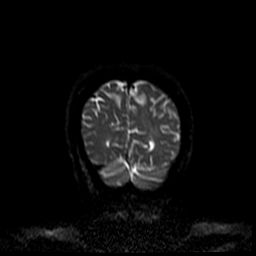
[im 53/66]
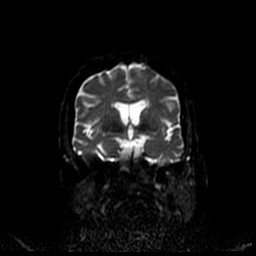
[im 66/66]
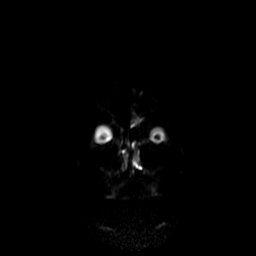

[Series 5: T1 · sagittal · 5.0mm · 0.47mm/px · 1 of 25 slices shown]
[im 1/25]
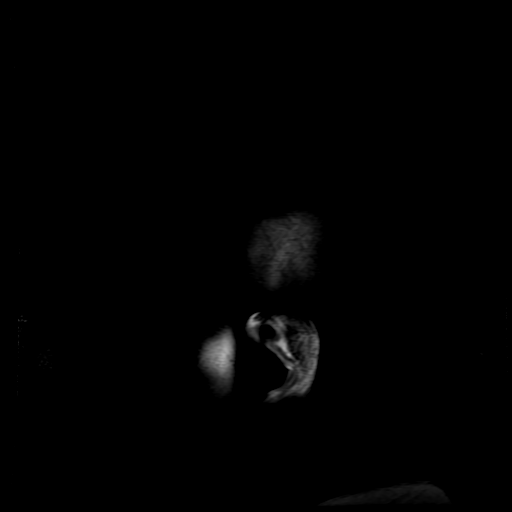

[Series 6: T2 · axial · 5.0mm · 0.43mm/px · z∈[-143,+6]mm · 2 of 26 slices shown (1 of 3)]
[im 1/26]
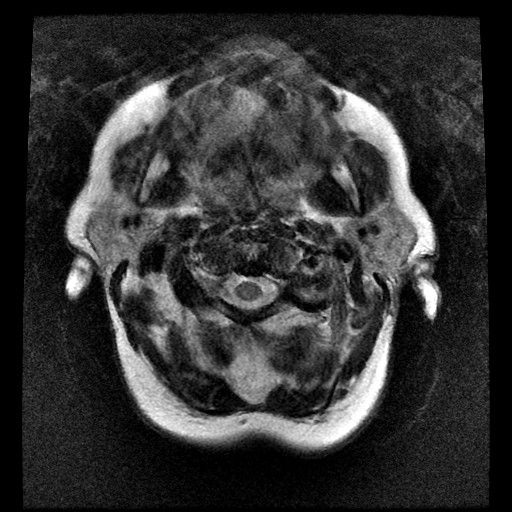
[im 26/26]
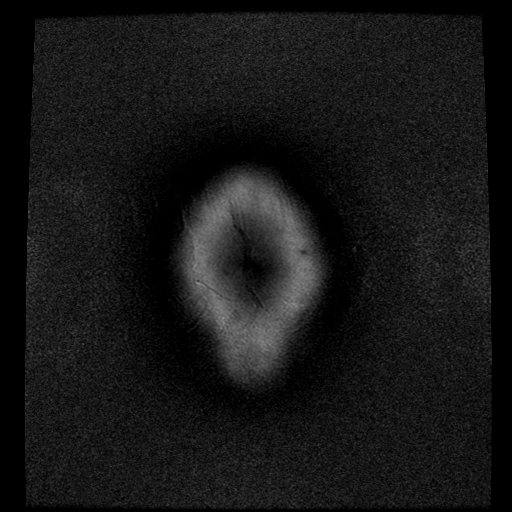

[Series 8: T2 · axial · 5.0mm · 0.43mm/px · z∈[-143,+6]mm · 2 of 26 slices shown (2 of 3)]
[im 1/26]
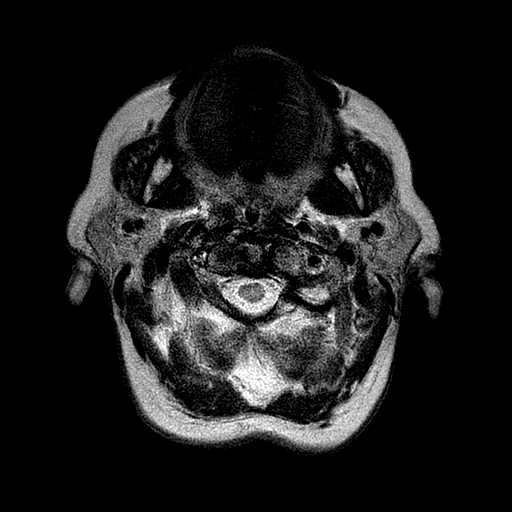
[im 26/26]
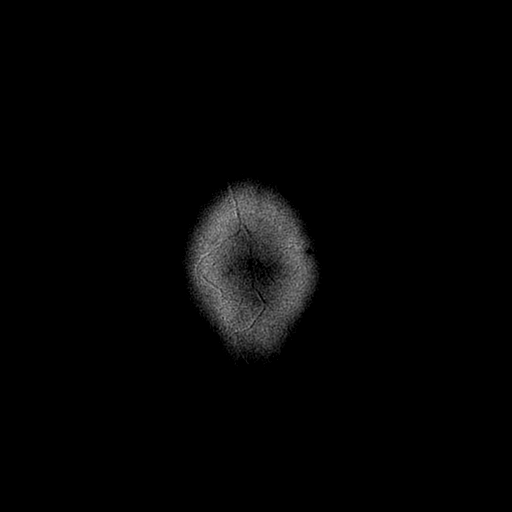

[Series 9: FLAIR · axial · 5.0mm · 0.43mm/px · z∈[-143,+6]mm · 2 of 26 slices shown]
[im 1/26]
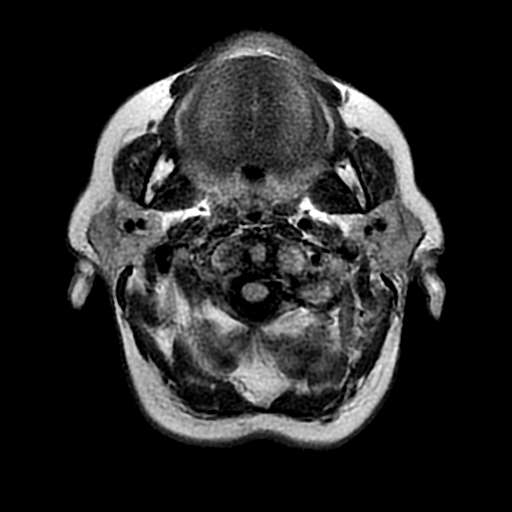
[im 26/26]
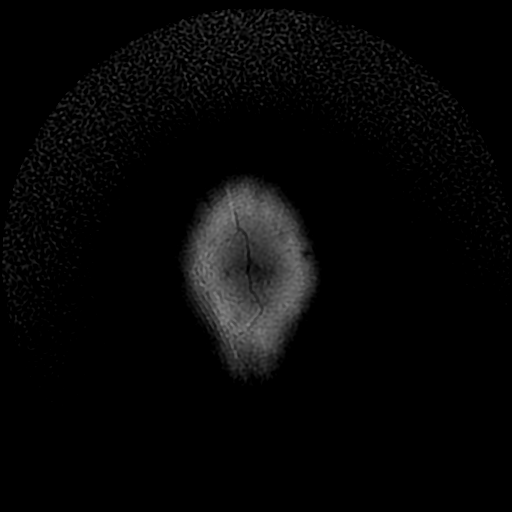

[Series 11: T2 · coronal · 5.0mm · 0.43mm/px · 3 of 28 slices shown (3 of 3)]
[im 1/28]
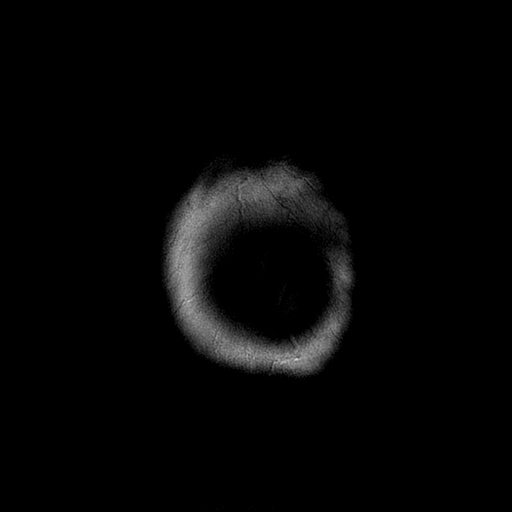
[im 14/28]
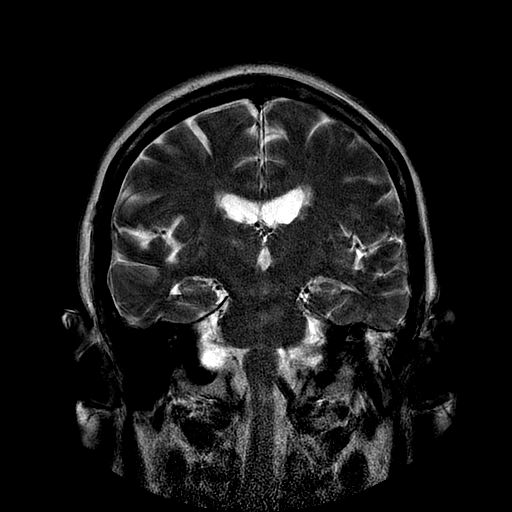
[im 28/28]
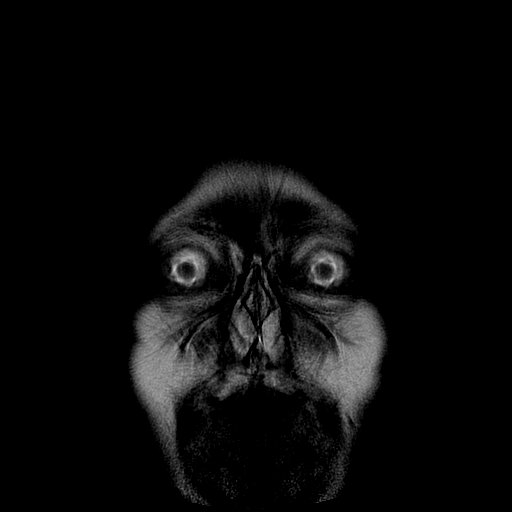

[Series 300: DWI · axial · 3.0mm · 1.09mm/px · z∈[-139,+7]mm · 5 of 50 slices shown (3 of 4)]
[im 1/50]
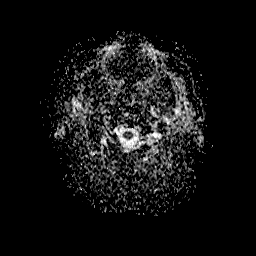
[im 13/50]
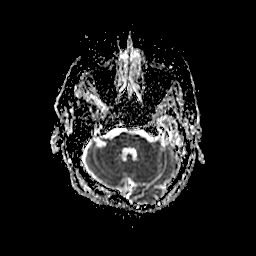
[im 25/50]
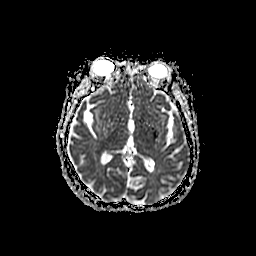
[im 37/50]
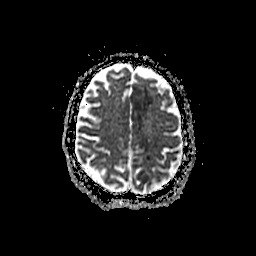
[im 50/50]
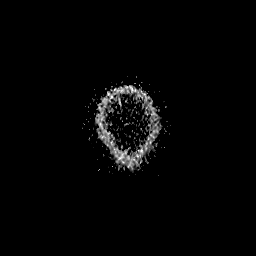

[Series 400: DWI · coronal · 5.0mm · 1.09mm/px · 3 of 33 slices shown (4 of 4)]
[im 1/33]
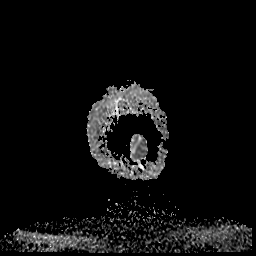
[im 17/33]
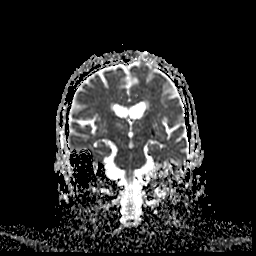
[im 33/33]
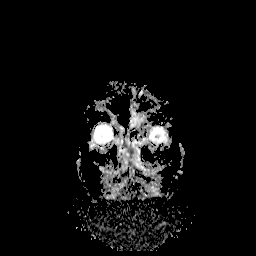

[32 of 48 positions shown; findings below may reference images not displayed]

FINDINGS: MRI HEAD FINDINGS

Evaluation is somewhat limited by motion artifact.

Brain: Redemonstrated areas of restricted diffusion in the medial
left frontal and parietal lobes, in both the left ACA territory and
watershed territory. Additional small area of restricted diffusion
in the left inferior cerebellum (series 3, image 12), unchanged. The
majority of the infarcts were seen on the prior exam, with the
exception of a small area of restricted diffusion in the parafalcine
right parietal lobe (series 3, image 37). Many of these areas are
not associated with increased T2 signal, and there is susceptibility
in the area of the left parafalcine frontal lobe (series 7, image
17), likely small amount of associated hemorrhage. Additional
susceptibility is noted in the area of the left A2, corresponding to
a stent.

No mass, mass, or midline shift.

Vascular: Please see MRA findings below.

Skull and upper cervical spine: Normal marrow signal.

Sinuses/Orbits: Mucosal thickening in the left frontal sinus. The
orbits are unremarkable.

Other: Fluid in left mastoid air cells.

MRA HEAD FINDINGS

Evaluation is somewhat limited by motion artifact.

Anterior circulation: Both internal carotid arteries are patent to
the termini, without stenosis or other abnormality. A1 segments
patent. Normal anterior communicating artery. Area of susceptibility
in the course of the left A2, secondary to stent placement. Anterior
cerebral arteries are otherwise patent to their distal aspects,
although evaluation is limited by both motion and susceptibility. No
M1 stenosis or occlusion; irregularity in the left M1 is felt to be
secondary to motion. Evaluation of the bifurcations and distal MCA
branches is limited by motion, however these grossly appear patent.

Posterior circulation: Diminutive right vertebral artery that
terminates primarily in PICA. Patent left vertebral artery. The
basilar artery is patent. The superior cerebellar arteries are
unremarkable. Fetal origin of the right PCA, with a patent posterior
communicating artery and no definite visualization of a right P1.
Possible narrowing of the proximal right P2, although evaluation is
limited by motion. The left PCA appears patent. The left posterior
communicating artery is not definitively visualized.

Anatomic variants: None significant
IMPRESSION: 1. Redemonstrated infarcts in the left ACA and ACA/PCA watershed
territory, with one new area of restricted diffusion in the medial
right parietal lobe, likely an additional acute infarct, status post
A2 stenting. A small amount of associated petechial hemorrhage is
noted within the medial left frontal lobe, without significant
hemorrhagic transformation.
2. Evaluation of the vasculature is limited by motion. Within this
limitation, there is no large vessel occlusion or significant
stenosis. Evaluation of the left A2 is limited by both motion and
susceptibility artifact from the stent. If further evaluation of the
vasculature is indicated, consider CTA.

## 2021-10-02 MED ORDER — ENOXAPARIN SODIUM 40 MG/0.4ML IJ SOSY
40.0000 mg | PREFILLED_SYRINGE | INTRAMUSCULAR | Status: DC
  Administered 2021-10-02 – 2021-10-06 (×5): 40 mg via SUBCUTANEOUS
  Filled 2021-10-02 (×5): qty 0.4

## 2021-10-02 MED ORDER — ATORVASTATIN CALCIUM 80 MG PO TABS
80.0000 mg | ORAL_TABLET | Freq: Every day | ORAL | Status: DC
  Administered 2021-10-02 – 2021-10-07 (×6): 80 mg via ORAL
  Filled 2021-10-02 (×6): qty 1

## 2021-10-02 MED ORDER — NICOTINE 21 MG/24HR TD PT24
21.0000 mg | MEDICATED_PATCH | Freq: Every day | TRANSDERMAL | Status: DC
  Administered 2021-10-02 – 2021-10-07 (×6): 21 mg via TRANSDERMAL
  Filled 2021-10-02 (×6): qty 1

## 2021-10-02 MED ORDER — LABETALOL HCL 5 MG/ML IV SOLN
5.0000 mg | INTRAVENOUS | Status: DC | PRN
Start: 2021-10-02 — End: 2021-10-05
  Administered 2021-10-03 – 2021-10-05 (×10): 20 mg via INTRAVENOUS
  Filled 2021-10-02 (×11): qty 4

## 2021-10-02 MED ORDER — CHLORHEXIDINE GLUCONATE CLOTH 2 % EX PADS
6.0000 | MEDICATED_PAD | Freq: Every day | CUTANEOUS | Status: DC
  Administered 2021-10-02 – 2021-10-05 (×4): 6 via TOPICAL

## 2021-10-02 NOTE — Progress Notes (Signed)
Referring Physician(s): Leonel Ramsay  Supervising Physician: Pedro Earls  Patient Status:  [facility] - In-pt  Chief Complaint:  Stroke  Brief History:  Ms. Stlouis is a 70 y/o female who presented to ED on 11/17 after a fall with R LE weakness and aphasia.   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   She underwent cerebral angiography = Findings: Occlusion of the left A2/ACA. Recanalization achieved after direct contact aspiration (TICI3). Follow up angiogram showed non flow limiting dissection. Delay images showed clot formation in the dissected segment. This segment was then stented with a 3 x 20 mm neuroform atlas stent. Excellent forward flow noted on follow-up angiograms. No hemorrhagic complication on flat panel head CT.  Subjective:  Lying in bed, husband at bedside. She is alert and oriented to place. She can give her DOB.  Allergies: Patient has no known allergies.  Medications: Prior to Admission medications   Medication Sig Start Date End Date Taking? Authorizing Provider  Aspirin-Acetaminophen-Caffeine (EXCEDRIN EXTRA STRENGTH PO) Take 1-2 tablets by mouth as needed (headache).   Yes [provider]     Vital Signs: BP 131/67   Pulse 76   Temp 98.2 F (36.8 C) (Axillary)   Resp 14   Ht 5\' 3"  (1.6 m)   Wt 73.3 kg   SpO2 93%   BMI 28.63 kg/m   Physical Exam Vitals reviewed.  Cardiovascular:     Rate and Rhythm: Normal rate.  Pulmonary:     Effort: Pulmonary effort is normal. No respiratory distress.  Abdominal:     Palpations: Abdomen is soft.  Neurological:     Mental Status: She is oriented to person, place, and time.     Comments: Alert, awake, oriented  Speech and comprehension intact. EOMs intact bilaterally without nystagmus or subjective diplopia. No facial asymmetry. Very mild right drift. Right hand 3/5 strength, left hand 5/5 No movement right  foot. Common femoral artery puncture site looks good, no bleeding, no hematoma, no pseudoaneurysm Gait not assessed. Romberg not assessed. Heel to toe not assessed.   Psychiatric:        Mood and Affect: Mood normal.        Behavior: Behavior normal.        Thought Content: Thought content normal.        Judgment: Judgment normal.    Imaging: CT HEAD WO CONTRAST  Result Date: 10/01/2021 CLINICAL DATA:  Stroke, follow-up status post mechanical thrombectomy and left A2 stenting EXAM: CT HEAD WITHOUT CONTRAST TECHNIQUE: Contiguous axial images were obtained from the base of the skull through the vertex without intravenous contrast. COMPARISON:  10/01/2021 FINDINGS: Brain: Redemonstrated mild loss of gray-white differentiation in the medial left frontal lobe, consistent with known left ACA occlusion, although the loss of gray-white is less distinct on the current exam. No new area of infarction. No hemorrhage, mass, mass effect, or midline shift. No hydrocephalus or extra-axial collection. Vascular: Interval placement of left A2 stent. No hyperdense vessel. Atherosclerotic calcifications in the intracranial carotid and vertebral arteries. Skull: Normal. Negative for fracture or focal lesion. Sinuses/Orbits: Mucosal thickening in the left frontal sinus and left anterior ethmoid air cells. The orbits are unremarkable. Other: Fluid in left mastoid air cells. IMPRESSION: Status post placement of a left A2 stent, with diminished conspicuity of previously noted loss of gray-white differentiation in the medial left frontal lobe. No new area of infarction. Electronically Signed  By: Merilyn Baba M.D.   On: 10/01/2021 18:09   MR BRAIN WO CONTRAST  Result Date: 10/01/2021 CLINICAL DATA:  Acute neuro deficit. EXAM: MRI HEAD WITHOUT CONTRAST TECHNIQUE: Multiplanar, multiecho pulse sequences of the brain and surrounding structures were obtained without intravenous contrast. COMPARISON:  CT head 10/01/2021  FINDINGS: Brain: Acute infarct left ACA territory. This primarily involves the medial frontal lobe on the left. Additional watershed infarct in the left frontal parietal white matter. Mild atrophy. Negative for hydrocephalus. Chronic microvascular ischemic change in the white matter and pons. 1 cm rounded hyperintensity left brachium pontis, indeterminate Study limited to diffusion weighted imaging and FLAIR imaging per Dr. Leonel Ramsay. IMPRESSION: Acute infarct left ACA territory. Additional area of acute infarct in the left watershed territory. Moderate chronic microvascular ischemic change in the white matter and pons 1 cm lesion left brachium pontis of indeterminate etiology. Possible chronic infarct, demyelinating disease or less likely mass lesion. Electronically Signed   By: Franchot Gallo M.D.   On: 10/01/2021 13:16   CT HEAD CODE STROKE WO CONTRAST  Result Date: 10/01/2021 CLINICAL DATA:  Code stroke. Neuro deficit, acute, stroke suspected. EXAM: CT HEAD WITHOUT CONTRAST TECHNIQUE: Contiguous axial images were obtained from the base of the skull through the vertex without intravenous contrast. COMPARISON:  None. FINDINGS: Brain: No focal abnormality affects the brainstem or cerebellum. There is loss of gray-white differentiation in the left frontal lobe suggesting left anterior cerebral artery territory infarction. There is an old small vessel infarction in the left basal ganglia. No mass, hemorrhage, hydrocephalus or extra-axial collection. Vascular: There is atherosclerotic calcification of the major vessels at the base of the brain. Skull: Negative Sinuses/Orbits: Clear/normal Other: None ASPECTS ([place] Stroke Program Early CT Score) - Ganglionic level infarction (caudate, lentiform nuclei, internal capsule, insula, M1-M3 cortex): 7 - Supraganglionic infarction (M4-M6 cortex): 3 Total score (0-10 with 10 being normal): 10 IMPRESSION: 1. Acute infarction in the left frontal lobe, anterior cerebral  artery territory. Loss of gray-white differentiation. No hemorrhage. 2. ASPECTS is 10 3. These results were communicated to Dr. Leonel Ramsay at 12:02 pm on 10/01/2021 by text page via the [facility] messaging system. Electronically Signed   By: Nelson Chimes M.D.   On: 10/01/2021 12:03   CT ANGIO HEAD NECK W WO CM W PERF (CODE STROKE)  Result Date: 10/01/2021 CLINICAL DATA:  Code stroke EXAM: CT ANGIOGRAPHY HEAD AND NECK CT PERFUSION BRAIN TECHNIQUE: Multidetector CT imaging of the head and neck was performed using the standard protocol during bolus administration of intravenous contrast. Multiplanar CT image reconstructions and MIPs were obtained to evaluate the vascular anatomy. Carotid stenosis measurements (when applicable) are obtained utilizing NASCET criteria, using the distal internal carotid diameter as the denominator. Multiphase CT imaging of the brain was performed following IV bolus contrast injection. Subsequent parametric perfusion maps were calculated using RAPID software. CONTRAST:  132mL OMNIPAQUE IOHEXOL 350 MG/ML SOLN COMPARISON:  Same-day noncontrast CT head FINDINGS: CTA NECK FINDINGS Aortic arch: There is minimal calcified atherosclerotic plaque of the aortic arch. Right carotid system: The right common carotid artery has a medialized course. There is minimal calcified atherosclerotic plaque in the proximal right internal carotid artery without hemodynamically significant stenosis or occlusion. There is no dissection or aneurysm. Left carotid system: The left common carotid artery has a medialized course. There is mild calcified atherosclerotic plaque in the left carotid bulb without hemodynamically significant stenosis or occlusion. There is no dissection or aneurysm. Vertebral arteries: The left vertebral artery is dominant,  a normal variant. The vertebral arteries are patent in the neck, with no hemodynamically significant stenosis, occlusion, dissection, or aneurysm. Skeleton: There is  multilevel degenerative change of the cervical spine. There is no acute osseous abnormality or aggressive osseous lesion. Other neck: There is complete opacification of the left mastoid air cells and middle ear cavity. There is no definite evidence of ossicular erosion. No definite nasopharyngeal mass is identified. The soft tissues are unremarkable. Upper chest: There is scarring in the left lung apex. Review of the MIP images confirms the above findings CTA HEAD FINDINGS Anterior circulation: There is mild calcified atherosclerotic plaque in the bilateral intracranial ICAs without hemodynamically significant stenosis. The bilateral MCAs are patent. There is mild irregularity on the left likely reflecting atherosclerotic disease without hemodynamically significant stenosis. The proximal ACAs are patent. There is focal occlusion of the left A2 segment (9-94, 8-71). There is reconstitution of flow distally. The right ACA is patent with moderate focal stenosis in the proximal A2 segment (8-81). There is no aneurysm. Posterior circulation: The right V4 segment is diminutive with a PICA termination. The left V4 segment is patent. The basilar artery is patent. The bilateral PCAs are patent. The right PCA is predominantly fed by a prominent right posterior communicating artery. The left posterior communicating artery is not definitely identified. There is no aneurysm. Venous sinuses: As permitted by contrast timing, patent. Anatomic variants: As above. Review of the MIP images confirms the above findings CT Brain Perfusion Findings: ASPECTS: 10 CBF (<30%) Volume: 27mL Perfusion (Tmax>6.0s) volume: 4mL Mismatch Volume: 25mL Infarction Location:No infarct identified. Ischemia is identified in the left ACA territory. IMPRESSION: 1. Focal occlusion of the left A2 segment with distal reconstitution. 2. 27 cc penumbra in the left ACA distribution. No core infarction identified. 3. Moderate focal stenosis of the proximal right  A2 segment. Otherwise, no hemodynamically significant stenosis in the vasculature of the head or neck. 4. Complete opacification of the left mastoid air cells and middle ear cavity. No definite obstructing nasopharyngeal mass is seen. These results were called by telephone at the time of interpretation on 10/01/2021 at 12:30 pm to provider [facility] , who verbally acknowledged these results. Electronically Signed   By: Valetta Mole M.D.   On: 10/01/2021 12:32    Labs:  CBC: Recent Labs    10/01/21 1130 10/01/21 1148 10/02/21 0401  WBC 7.9  --  14.7*  HGB 15.2* 15.0 13.5  HCT 44.3 44.0 40.0  PLT 246  --  274    COAGS: Recent Labs    10/01/21 1130  INR 0.9  APTT 25    BMP: Recent Labs    10/01/21 1130 10/01/21 1148 10/02/21 0401  NA 134* 139 135  K 4.6 4.2 3.7  CL 103 106 108  CO2 21*  --  20*  GLUCOSE 125* 114* 128*  BUN 21 23 17   CALCIUM 9.3  --  8.6*  CREATININE 0.90 0.70 0.86  GFRNONAA >60  --  >60    LIVER FUNCTION TESTS: Recent Labs    10/01/21 1130  BILITOT 1.0  AST 31  ALT 25  ALKPHOS 75  PROT 7.2  ALBUMIN 3.9    Assessment and Plan:  Occlusion of the left A2/ACA.   Cerebral angiogram with recanalization achieved after direct contact aspiration (TICI3). Follow up angiogram showed non flow limiting dissection. Delay images showed clot formation in the dissected segment. This segment was then stented with a 3 x 20 mm neuroform atlas stent.  Continue Asa and Brilinta as ordered.  NIR will arrange follow up diagnostic angiography in 6 months.  Electronically Signed: Murrell Redden, PA-C 10/02/2021, 12:18 PM    I spent a total of 15 Minutes at the the patient's bedside AND on the patient's hospital floor or unit, greater than 50% of which was counseling/coordinating care for f/u cerebral intervention.

## 2021-10-02 NOTE — TOC CAGE-AID Note (Signed)
Transition of Care [facility]) - CAGE-AID Screening   Patient Details  Name: Andrea Williamson MRN: [id number] Date of Birth: 1950/12/09  [facility]) CM/SW Contact:    Ryland Tungate C Tarpley-Carter, [place] Phone Number: 10/02/2021, 9:23 AM   Clinical Narrative: Pt is unable to participate in Cage Aid.   Velta Rockholt Tarpley-Carter, MSW, LCSW-A Pronouns:  She/Her/Hers [facility] Clinical Social Worker Direct Number:  [phone] [email or web address]   CAGE-AID Screening: Substance Abuse Screening unable to be completed due to: : Patient unable to participate             Substance Abuse Education Offered: No

## 2021-10-02 NOTE — Evaluation (Signed)
Speech Language Pathology Evaluation Patient Details Name: Andrea Williamson MRN: [id number] DOB: August 27, 1951 Today's Date: 10/02/2021 Time: 1430-1450 SLP Time Calculation (min) (ACUTE ONLY): 20 min  Problem List:  Patient Active Problem List   Diagnosis Date Noted   Stroke (cerebrum) ([place]) 10/01/2021   Past Medical History: No past medical history on file. Past Surgical History: The histories are not reviewed yet. Please review them in the "History" navigator section and refresh this [place]. HPI:  70 y/o female presented to ED on 11/17 after a fall with R LE weakness and aphasia. 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 S/p mechanical thrombectomy of L ACA. PMH: HLD   Assessment / Plan / Recommendation Clinical Impression  Pt seen, no family at bedside, for speech-language-cognitive evaluation. Her oral motor exam was unremarkable and speech intelligible. She lives with spouse and is retired, states she takes no meds prior and manages the finances at home. She attempted clock drawing however unable due to severity of right hand shaking despite therapist assisting. SLUMS was scored out of a 26 and she scored a 14 losing points mainly in memory, divergent naming and auditory retention of information. Inpatient rehab would be beneficial and ST will continue to focus on cognition with goal of increasing independence.    SLP Assessment  SLP Recommendation/Assessment: Patient needs continued Speech [facility] SLP Visit Diagnosis: Cognitive communication deficit (R41.841)    Recommendations for follow up therapy are one component of a multi-disciplinary discharge planning process, led by the attending physician.  Recommendations may be updated based on patient status, additional functional criteria and insurance authorization.    Follow Up Recommendations  Acute inpatient rehab (3hours/day)    Assistance  Recommended at Discharge     Functional Status Assessment    Frequency and Duration min 2x/week  2 weeks      SLP Evaluation Cognition  Overall Cognitive Status: Impaired/Different from baseline Arousal/Alertness: Awake/alert Orientation Level: Oriented to time;Oriented to place;Oriented to person Year: 2022 Attention: Sustained Sustained Attention: Appears intact Memory: Impaired Memory Impairment: Retrieval deficit;Storage deficit Awareness: Impaired Awareness Impairment: Anticipatory impairment Problem Solving:  (will assess for higher level) Safety/Judgment: Impaired       Comprehension  Auditory Comprehension Overall Auditory Comprehension: Appears within functional limits for tasks assessed Visual Recognition/Discrimination Discrimination: Not tested Reading Comprehension Reading Status:  (TBA)    Expression Expression Primary Mode of Expression: Verbal Verbal Expression Overall Verbal Expression: Appears within functional limits for tasks assessed Pragmatics: No impairment Written Expression Dominant Hand: Right Written Expression: Not tested   Oral / Motor  Oral Motor/Sensory Function Overall Oral Motor/Sensory Function: Within functional limits Motor Speech Overall Motor Speech: Appears within functional limits for tasks assessed Articulation: Within functional limitis Intelligibility: Intelligible Motor Planning: Witnin functional limits   GO                    Houston Siren 10/02/2021, 3:51 PM

## 2021-10-02 NOTE — Progress Notes (Signed)
? ?  Inpatient Rehab Admissions Coordinator : ? ?Per therapy recommendations, patient was screened for CIR candidacy by Natalyah Cummiskey RN MSN.  At this time patient appears to be a potential candidate for CIR. I will place a rehab consult per protocol for full assessment. Please call me with any questions. ? ?Lam Bjorklund RN MSN ?Admissions Coordinator ?[phone] ?  ?

## 2021-10-02 NOTE — Progress Notes (Signed)
  Echocardiogram 2D Echocardiogram has been performed.  Merrie Roof F 10/02/2021, 11:13 AM

## 2021-10-02 NOTE — Evaluation (Signed)
Occupational Therapy Evaluation Patient Details Name: Andrea Williamson MRN: [id number] DOB: 10-Apr-1951 Today's Date: 10/02/2021   History of Present Illness 70 y/o female presented to ED on 11/17 after a fall with R LE weakness and aphasia. 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 S/p mechanical thrombectomy of L ACA. PMH: HLD   Clinical Impression   Patient admitted for the diagnosis above.  PTA she lives with her spouse, who is able to assist as needed.  The patient was independent with all aspects of ADL/IADL, continues to drive, and enjoys bowling 2x/wk.  Deficits impacting independence are listed below.  Currently she is needing up to Max A of 2 for basic mobility, and up to Max A for lower body ADL at bedlevel.  OT will follow in the acute setting, but the patient would benefit from an intensive, multi disciplined approach at CIR for post acute rehab prior to returning home.       Recommendations for follow up therapy are one component of a multi-disciplinary discharge planning process, led by the attending physician.  Recommendations may be updated based on patient status, additional functional criteria and insurance authorization.   Follow Up Recommendations  Acute inpatient rehab (3hours/day)    Assistance Recommended at Discharge Frequent or constant Supervision/Assistance  Functional Status Assessment  Patient has had a recent decline in their functional status and demonstrates the ability to make significant improvements in function in a reasonable and predictable amount of time.  Equipment Recommendations  BSC/3in1;Tub/shower seat    Recommendations for Other Services Rehab consult     Precautions / Restrictions Precautions Precautions: Fall Precaution Comments: SBP <140 Restrictions Weight Bearing Restrictions: No      Mobility Bed Mobility Overal bed mobility: Needs Assistance Bed Mobility: Supine to  Sit;Sit to Supine     Supine to sit: Mod assist;+2 for physical assistance;+2 for safety/equipment Sit to supine: Max assist;+2 for physical assistance;+2 for safety/equipment   General bed mobility comments: modA+2 to bring R LE towards EOB, trunk elevation, and repositioning hips towards EOB. MaxA+2 to return to supine    Transfers Overall transfer level: Needs assistance Equipment used: 2 person hand held assist Transfers: Sit to/from Stand Sit to Stand: Mod assist;+2 physical assistance;+2 safety/equipment           General transfer comment: modA+2 to stand from EOB with no activation of R LE.      Balance Overall balance assessment: Needs assistance Sitting-balance support: Single extremity supported;Feet supported Sitting balance-Leahy Scale: Poor Sitting balance - Comments: R lateral lean requiring constant cues for maintaining midline. Improved with L hand on recliner in front of her Postural control: Right lateral lean Standing balance support: Bilateral upper extremity supported Standing balance-Leahy Scale: Zero                             ADL either performed or assessed with clinical judgement   ADL Overall ADL's : Needs assistance/impaired     Grooming: Wash/dry hands;Wash/dry face;Minimal assistance;Bed level   Upper Body Bathing: Moderate assistance;Bed level   Lower Body Bathing: Total assistance;Bed level   Upper Body Dressing : Moderate assistance;Sitting   Lower Body Dressing: Total assistance;Bed level       Toileting- Clothing Manipulation and Hygiene: Total assistance;Bed level       Functional mobility during ADLs: Moderate assistance;+2 for physical assistance General ADL Comments: no acitive contraction  noted to R LE, difficulty maintaining midline stting - R lean bias with cues to correct R lean, using L arm to hold onto armrest of the recliner     Vision Baseline Vision/History: 1 Wears glasses Vision Assessment?: No  apparent visual deficits;Vision impaired- to be further tested in functional context Additional Comments: Continue to assess for tracking and ensure no visual deficts     Perception Perception Perception: Impaired Inattention/Neglect: Impaired-to be further tested in functional context   Praxis Praxis Praxis Impairment Details: Initiation;Ideomotor;Ideation (further testing in a functional activity.)    Pertinent Vitals/Pain Pain Assessment: No/denies pain     Hand Dominance Right   Extremity/Trunk Assessment Upper Extremity Assessment Upper Extremity Assessment: RUE deficits/detail RUE Deficits / Details: Grossly 3+/5 for MMT, difficulty maintain sustained AROM RUE Sensation: WNL RUE Coordination: WNL   Lower Extremity Assessment Lower Extremity Assessment: Defer to PT evaluation RLE Deficits / Details: Hip flexors 1/5 but otherwise 0/5 RLE Sensation: decreased light touch RLE Coordination: decreased fine motor;decreased gross motor   Cervical / Trunk Assessment Cervical / Trunk Assessment: Kyphotic   Communication Communication Communication: Expressive difficulties   Cognition Arousal/Alertness: Awake/alert Behavior During Therapy: Flat affect Overall Cognitive Status: Impaired/Different from baseline Area of Impairment: Attention;Following commands;Safety/judgement;Problem solving;Awareness                   Current Attention Level: Sustained   Following Commands: Follows one step commands with increased time;Follows multi-step commands inconsistently Safety/Judgement: Decreased awareness of safety Awareness: Emergent Problem Solving: Slow processing;Difficulty sequencing;Requires verbal cues;Requires tactile cues General Comments: A&Ox4. Follows commands with increased time. Difficult to fully assess due to patient not very conversive this session     General Comments  VSS on 2L O2 [place] during mobility    Exercises     Shoulder Instructions       Home Living Family/patient expects to be discharged to:: Private residence Living Arrangements: Spouse/significant other Available Help at Discharge: Family;Available 24 hours/day Type of Home: House Home Access: Stairs to enter [employer] of Steps: 3 Entrance Stairs-Rails: Right;Left Home Layout: One level     Bathroom Shower/Tub: Teacher, early years/pre: Standard     Home Equipment: None          Prior Functioning/Environment Prior Level of Function : Independent/Modified Independent;Driving             Mobility Comments: enjoys bowling          OT Problem List: Decreased strength;Decreased range of motion;Decreased activity tolerance;Impaired balance (sitting and/or standing);Decreased knowledge of use of DME or AE;Decreased safety awareness;Impaired UE functional use      OT Treatment/Interventions: Self-care/ADL training;Therapeutic exercise;Therapeutic activities;Patient/family education;DME and/or AE instruction;Balance training    OT Goals(Current goals can be found in the care plan section) Acute Rehab OT Goals Patient Stated Goal: none stated OT Goal Formulation: With patient Time For Goal Achievement: 10/16/21 Potential to Achieve Goals: Good  OT Frequency: Min 2X/week   Barriers to D/C:    none noted       Co-evaluation PT/OT/SLP Co-Evaluation/Treatment: Yes Reason for Co-Treatment: Complexity of the patient's impairments (multi-system involvement);For patient/therapist safety PT goals addressed during session: Mobility/safety with mobility;Balance OT goals addressed during session: ADL's and self-care      AM-PAC OT "6 Clicks" Daily Activity     Outcome Measure Help from another person eating meals?: A Lot Help from another person taking care of personal grooming?: A Lot Help from another person toileting, which includes using toliet,  bedpan, or urinal?: Total Help from another person bathing (including washing,  rinsing, drying)?: A Lot Help from another person to put on and taking off regular upper body clothing?: A Lot Help from another person to put on and taking off regular lower body clothing?: Total 6 Click Score: 10   End of Session Equipment Utilized During Treatment: Gait belt Nurse Communication: Mobility status  Activity Tolerance: Patient tolerated treatment well Patient left: in bed;with call bell/phone within reach;with bed alarm set  OT Visit Diagnosis: Unsteadiness on feet (R26.81);Other abnormalities of gait and mobility (R26.89);Muscle weakness (generalized) (M62.81);Other symptoms and signs involving cognitive function;Hemiplegia and hemiparesis;Apraxia (R48.2) Hemiplegia - Right/Left: Right Hemiplegia - dominant/non-dominant: Dominant Hemiplegia - caused by: Cerebral infarction                Time: 5625-6389 OT Time Calculation (min): 31 min Charges:  OT General Charges $OT Visit: 1 Visit OT Evaluation $OT Eval Moderate Complexity: 1 Mod  10/02/2021  RP, OTR/L  Acute Rehabilitation Services  Office:  [phone]   Metta Clines 10/02/2021, 11:20 AM

## 2021-10-02 NOTE — Plan of Care (Signed)
  Problem: Education: Goal: Knowledge of disease or condition will improve Outcome: Progressing   Problem: Coping: Goal: Will verbalize positive feelings about self Outcome: Progressing Goal: Will identify appropriate support needs Outcome: Progressing   

## 2021-10-02 NOTE — Progress Notes (Addendum)
STROKE TEAM PROGRESS NOTE   SUBJECTIVE (INTERVAL HISTORY) Sister in law at the bedside and Husband on speaker phone to assist in history.    OBJECTIVE Vitals:   10/02/21 1300 10/02/21 1315 10/02/21 1330 10/02/21 1345  BP: (!) 142/61 124/63 (!) 125/58 136/60  Pulse: 76 76 81 87  Resp: (!) 21 20    Temp:      TempSrc:      SpO2: 94% 92% 94% 96%  Weight:      Height:        CBC:  Recent Labs  Lab 10/01/21 1130 10/01/21 1148 10/02/21 0401  WBC 7.9  --  14.7*  NEUTROABS 6.2  --   --   HGB 15.2* 15.0 13.5  HCT 44.3 44.0 40.0  MCV 95.7  --  95.9  PLT 246  --  774    Basic Metabolic Panel:  Recent Labs  Lab 10/01/21 1130 10/01/21 1148 10/02/21 0401  NA 134* 139 135  K 4.6 4.2 3.7  CL 103 106 108  CO2 21*  --  20*  GLUCOSE 125* 114* 128*  BUN 21 23 17   CREATININE 0.90 0.70 0.86  CALCIUM 9.3  --  8.6*    Lipid Panel:  Recent Labs  Lab 10/02/21 0401  CHOL 317*  TRIG 182*  HDL 49  CHOLHDL 6.5  VLDL 36  LDLCALC 232*   HgbA1c:  Lab Results  Component Value Date   HGBA1C 5.0 10/02/2021   Urine Drug Screen:     Component Value Date/Time   LABOPIA NONE DETECTED 10/01/2021 1723   COCAINSCRNUR NONE DETECTED 10/01/2021 1723   LABBENZ NONE DETECTED 10/01/2021 1723   AMPHETMU NONE DETECTED 10/01/2021 1723   THCU NONE DETECTED 10/01/2021 1723   LABBARB NONE DETECTED 10/01/2021 1723    Alcohol Level     Component Value Date/Time   ETH <10 10/01/2021 1131    IMAGING  Results for orders placed or performed during the hospital encounter of 10/01/21  MR BRAIN WO CONTRAST   Narrative   CLINICAL DATA:  Stroke, follow-up  EXAM: MRI HEAD WITHOUT CONTRAST  MRA HEAD WITHOUT CONTRAST  TECHNIQUE: Multiplanar, multi-echo pulse sequences of the brain and surrounding structures were acquired without intravenous contrast. Angiographic images of the Circle of Willis were acquired using MRA technique without intravenous contrast.  COMPARISON:  MRI  10/01/2021, correlation is also made with CT head 10/01/2021 11:59 a.m. and 5:01 p.m.  FINDINGS: MRI HEAD FINDINGS  Evaluation is somewhat limited by motion artifact.  Brain: Redemonstrated areas of restricted diffusion in the medial left frontal and parietal lobes, in both the left ACA territory and watershed territory. Additional small area of restricted diffusion in the left inferior cerebellum (series 3, image 12), unchanged. The majority of the infarcts were seen on the prior exam, with the exception of a small area of restricted diffusion in the parafalcine right parietal lobe (series 3, image 37). Many of these areas are not associated with increased T2 signal, and there is susceptibility in the area of the left parafalcine frontal lobe (series 7, image 17), likely small amount of associated hemorrhage. Additional susceptibility is noted in the area of the left A2, corresponding to a stent.  No mass, mass, or midline shift.  Vascular: Please see MRA findings below.  Skull and upper cervical spine: Normal marrow signal.  Sinuses/Orbits: Mucosal thickening in the left frontal sinus. The orbits are unremarkable.  Other: Fluid in left mastoid air cells.  MRA HEAD FINDINGS  Evaluation  is somewhat limited by motion artifact.  Anterior circulation: Both internal carotid arteries are patent to the termini, without stenosis or other abnormality. A1 segments patent. Normal anterior communicating artery. Area of susceptibility in the course of the left A2, secondary to stent placement. Anterior cerebral arteries are otherwise patent to their distal aspects, although evaluation is limited by both motion and susceptibility. No M1 stenosis or occlusion; irregularity in the left M1 is felt to be secondary to motion. Evaluation of the bifurcations and distal MCA branches is limited by motion, however these grossly appear patent.  Posterior circulation: Diminutive right vertebral  artery that terminates primarily in PICA. Patent left vertebral artery. The basilar artery is patent. The superior cerebellar arteries are unremarkable. Fetal origin of the right PCA, with a patent posterior communicating artery and no definite visualization of a right P1. Possible narrowing of the proximal right P2, although evaluation is limited by motion. The left PCA appears patent. The left posterior communicating artery is not definitively visualized.  Anatomic variants: None significant  IMPRESSION: 1. Redemonstrated infarcts in the left ACA and ACA/PCA watershed territory, with one new area of restricted diffusion in the medial right parietal lobe, likely an additional acute infarct, status post A2 stenting. A small amount of associated petechial hemorrhage is noted within the medial left frontal lobe, without significant hemorrhagic transformation. 2. Evaluation of the vasculature is limited by motion. Within this limitation, there is no large vessel occlusion or significant stenosis. Evaluation of the left A2 is limited by both motion and susceptibility artifact from the stent. If further evaluation of the vasculature is indicated, consider CTA.   Electronically Signed   By: Merilyn Baba M.D.   On: 10/02/2021 14:04   MR ANGIO HEAD WO CONTRAST   Narrative   CLINICAL DATA:  Stroke, follow-up  EXAM: MRI HEAD WITHOUT CONTRAST  MRA HEAD WITHOUT CONTRAST  TECHNIQUE: Multiplanar, multi-echo pulse sequences of the brain and surrounding structures were acquired without intravenous contrast. Angiographic images of the Circle of Willis were acquired using MRA technique without intravenous contrast.  COMPARISON:  MRI 10/01/2021, correlation is also made with CT head 10/01/2021 11:59 a.m. and 5:01 p.m.  FINDINGS: MRI HEAD FINDINGS  Evaluation is somewhat limited by motion artifact.  Brain: Redemonstrated areas of restricted diffusion in the medial left frontal and  parietal lobes, in both the left ACA territory and watershed territory. Additional small area of restricted diffusion in the left inferior cerebellum (series 3, image 12), unchanged. The majority of the infarcts were seen on the prior exam, with the exception of a small area of restricted diffusion in the parafalcine right parietal lobe (series 3, image 37). Many of these areas are not associated with increased T2 signal, and there is susceptibility in the area of the left parafalcine frontal lobe (series 7, image 17), likely small amount of associated hemorrhage. Additional susceptibility is noted in the area of the left A2, corresponding to a stent.  No mass, mass, or midline shift.  Vascular: Please see MRA findings below.  Skull and upper cervical spine: Normal marrow signal.  Sinuses/Orbits: Mucosal thickening in the left frontal sinus. The orbits are unremarkable.  Other: Fluid in left mastoid air cells.  MRA HEAD FINDINGS  Evaluation is somewhat limited by motion artifact.  Anterior circulation: Both internal carotid arteries are patent to the termini, without stenosis or other abnormality. A1 segments patent. Normal anterior communicating artery. Area of susceptibility in the course of the left A2, secondary to stent placement.  Anterior cerebral arteries are otherwise patent to their distal aspects, although evaluation is limited by both motion and susceptibility. No M1 stenosis or occlusion; irregularity in the left M1 is felt to be secondary to motion. Evaluation of the bifurcations and distal MCA branches is limited by motion, however these grossly appear patent.  Posterior circulation: Diminutive right vertebral artery that terminates primarily in PICA. Patent left vertebral artery. The basilar artery is patent. The superior cerebellar arteries are unremarkable. Fetal origin of the right PCA, with a patent posterior communicating artery and no definite  visualization of a right P1. Possible narrowing of the proximal right P2, although evaluation is limited by motion. The left PCA appears patent. The left posterior communicating artery is not definitively visualized.  Anatomic variants: None significant  IMPRESSION: 1. Redemonstrated infarcts in the left ACA and ACA/PCA watershed territory, with one new area of restricted diffusion in the medial right parietal lobe, likely an additional acute infarct, status post A2 stenting. A small amount of associated petechial hemorrhage is noted within the medial left frontal lobe, without significant hemorrhagic transformation. 2. Evaluation of the vasculature is limited by motion. Within this limitation, there is no large vessel occlusion or significant stenosis. Evaluation of the left A2 is limited by both motion and susceptibility artifact from the stent. If further evaluation of the vasculature is indicated, consider CTA.   Electronically Signed   By: Merilyn Baba M.D.   On: 10/02/2021 14:04   CT HEAD WO CONTRAST   Narrative   CLINICAL DATA:  Stroke, follow-up status post mechanical thrombectomy and left A2 stenting  EXAM: CT HEAD WITHOUT CONTRAST  TECHNIQUE: Contiguous axial images were obtained from the base of the skull through the vertex without intravenous contrast.  COMPARISON:  10/01/2021  FINDINGS: Brain: Redemonstrated mild loss of gray-white differentiation in the medial left frontal lobe, consistent with known left ACA occlusion, although the loss of gray-white is less distinct on the current exam. No new area of infarction. No hemorrhage, mass, mass effect, or midline shift. No hydrocephalus or extra-axial collection.  Vascular: Interval placement of left A2 stent. No hyperdense vessel. Atherosclerotic calcifications in the intracranial carotid and vertebral arteries.  Skull: Normal. Negative for fracture or focal lesion.  Sinuses/Orbits: Mucosal thickening  in the left frontal sinus and left anterior ethmoid air cells. The orbits are unremarkable.  Other: Fluid in left mastoid air cells.  IMPRESSION: Status post placement of a left A2 stent, with diminished conspicuity of previously noted loss of gray-white differentiation in the medial left frontal lobe. No new area of infarction.   Electronically Signed   By: Merilyn Baba M.D.   On: 10/01/2021 18:09        PHYSICAL EXAM  Physical Exam  Constitutional: Appears well-developed and well-nourished.  Psych: Affect appropriate to situation Eyes: No scleral injection HENT: No OP obstrucion MSK: bilateral upper extremity tremor  Cardiovascular: Normal rate and regular rhythm.  Respiratory: Effort normal, non-labored breathing, 2LNC in place GI: Soft.  No distension. There is no tenderness.  Skin: WDI  Neuro: Mental Status: Patient is awake, alert, oriented to person, place, month, year, and situation. Patient is able to give a clear and coherent history. No aphasia but paucity of speech. Patient is able to follow all simple commands.   Cranial Nerves: II: Visual Fields are full. Pupils are equal, round, and reactive to light.   III,IV, VI: EOMI without ptosis or diploplia.  V: Facial sensation is symmetric to temperature VII: Mild right  nasolabial fold flattening.  VIII: Hearing is intact to voice X: Palate elevates symmetrically XI: Shoulder shrug is symmetric. XII: Tongue protrudes midline.  Motor: Tone is normal. Bulk is normal.  RUE pronator drift: proximal 4/5;  distal 4+/5 RLE 2/5 LUE and LLE 5/5.  Sensory: Sensation is symmetric to light touch in the arms and legs. No extinction to DSS present.  Cerebellar: FTN intact, slow on the right.   Gait not tested for safety.     ASSESSMENT/PLAN [name] is a 70 y.o. female with history of hyperlipidemia presenting with aphasia and right leg weakness.   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  MRA head post A2 stenting, distal A2 seems patent.  2D Echo EF 65 to 70% LDL 232 HgbA1c 5.0 Lovenox for VTE prophylaxis No antithrombotic prior to admission, now on Brilinta (ticagrelor) 90 mg bid and ASA 81mg  Ongoing aggressive stroke risk factor management Therapy recommendations:  Acute inpatient rehab Disposition:  Neuro ICU  Hypertension On the high end BP less than 140 for 24 hours post stenting. Then BP less than 180/105 Long-term BP goal normotensive Taper off Cleveprex as able PRN labetolol   Hyperlipidemia Home meds:  None  LDL 232, goal < 70 Lipitor 80mg  daily Continue statin at discharge  Tobacco abuse Current smoker Smoking cessation counseling provided Nicotine patch provided Pt is willing to quit  Other Stroke Risk Factors B/l arm tremor - chronic    Hospital day # 1   Pt seen by NP/Neuro and later by MD. Note/plan to be edited by MD as needed.  Janine Ores, FNP-BC [facility]  ATTENDING NOTE: I reviewed above note and agree with the assessment and plan. Pt was seen and examined.   70 year old female with history of HLD admitted for right-sided weakness, aphasia.  CT showed left ACA anterior corpus callosum infarct.  CTA head and neck left A2 occlusion, CTP 0 core with 27 cc penumbra more at posterior corpus callosum.  MRI showed left ACA infarct more at anterior corpus callosum,  therefore there was tissue to say at left ACA territory.  Status post IR with TICI3 revascularization with ACA stenting.  MRI post EVT showed no significant additional infarct at the left ACA territory.  MRI showed left A2 stent patent.  EF 65 to 70%, LDL 232, A1c 5.0, creatinine 0.90.  On exam, daughter at bedside, patient awake, alert, eyes open, orientated to age, place, time. No aphasia but paucity of speech, following all simple commands. Able to name and repeat. No gaze palsy, tracking bilaterally, visual field full, PERRL. Mild right nasolabial fold flattening. Tongue midline. RUE pronator drift, proximal 4-/5, distal 4+/5. RLE 2-/5 proximal and distal. LUE and LLE 5/5. Sensation symmetrical bilaterally, b/l FTN intact although slow on the right, gait not tested.   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  Cardioembolic source  still in differential.  May consider 30-day cardiac event monitor as outpatient.  Continue aspirin 81 and Brilinta DAPT post stenting.  Relax BP to <180/105 after 24 hours IR, taper off Cleviprex as able.  Continue Lipitor 80.  Smoking cessation education provided, put on nicotine patch.  PT/OT recommend CIR.  For detailed assessment and plan, please refer to above as I have made changes wherever appropriate.   Rosalin Hawking, MD PhD Stroke Neurology 10/02/2021 7:34 PM  This patient is critically ill due to left ACA stroke, left ACA occlusion, status post thrombectomy and stenting and at significant risk of neurological worsening, death form recurrent stroke, stent occlusion, hemorrhagic conversion, seizure. This patient's care requires constant monitoring of vital signs, hemodynamics, respiratory and cardiac monitoring, review of multiple databases, neurological assessment, discussion with family, other specialists and medical decision making of high complexity. I spent 40 minutes of  neurocritical care time in the care of this patient. I had long discussion with daughter at bedside and other daughter over the phone, updated pt current condition, treatment plan and potential prognosis, and answered all the questions.  They expressed understanding and appreciation.      To contact Stroke Continuity provider, please refer to [email or web address] After hours, contact General Neurology

## 2021-10-02 NOTE — Evaluation (Signed)
Physical Therapy Evaluation Patient Details Name: Andrea Williamson MRN: [id number] DOB: 02/23/1951 Today's Date: 10/02/2021  History of Present Illness  70 y/o female presented to ED on 11/17 after a fall with R LE weakness and aphasia. 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 S/p mechanical thrombectomy of L ACA. PMH: HLD  Clinical Impression  Patient admitted with above diagnosis. Patient presents with R sided weakness, impaired balance, decreased activity tolerance, impaired coordination, and impaired cognition. Patient requires mod-maxA+2 for bed mobility and modA+2 for sit to stand transfer. Patient with attention and awareness deficits. Requires increased time and cues for following commands. Patient will benefit from skilled PT services during acute stay to address listed deficits. Recommend CIR at discharge to maximize functional mobility and safety prior to discharge home. Husband is available for necessary supervision/assistance at discharge.        Recommendations for follow up therapy are one component of a multi-disciplinary discharge planning process, led by the attending physician.  Recommendations may be updated based on patient status, additional functional criteria and insurance authorization.  Follow Up Recommendations Acute inpatient rehab (3hours/day)    Assistance Recommended at Discharge Frequent or constant Supervision/Assistance  Functional Status Assessment Patient has had a recent decline in their functional status and demonstrates the ability to make significant improvements in function in a reasonable and predictable amount of time.  Equipment Recommendations  Other (comment) (TBD)    Recommendations for Other Services Rehab consult     Precautions / Restrictions Precautions Precautions: Fall Precaution Comments: SBP <140 Restrictions Weight Bearing Restrictions: No      Mobility  Bed  Mobility Overal bed mobility: Needs Assistance Bed Mobility: Supine to Sit;Sit to Supine     Supine to sit: Mod assist;+2 for physical assistance;+2 for safety/equipment Sit to supine: Max assist;+2 for physical assistance;+2 for safety/equipment   General bed mobility comments: modA+2 to bring R LE towards EOB, trunk elevation, and repositioning hips towards EOB. MaxA+2 to return to supine    Transfers Overall transfer level: Needs assistance Equipment used: 2 person hand held assist Transfers: Sit to/from Stand Sit to Stand: Mod assist;+2 physical assistance;+2 safety/equipment           General transfer comment: modA+2 to stand from EOB with no activation of R LE.    Ambulation/Gait               General Gait Details: unable at this time  Stairs            Wheelchair Mobility    Modified Rankin (Stroke Patients Only) Modified Rankin (Stroke Patients Only) Pre-Morbid Rankin Score: No symptoms Modified Rankin: Severe disability     Balance Overall balance assessment: Needs assistance Sitting-balance support: Single extremity supported;Feet supported Sitting balance-Leahy Scale: Poor Sitting balance - Comments: R lateral lean requiring constant cues for maintaining midline. Improved with L hand on recliner in front of her Postural control: Right lateral lean Standing balance support: Bilateral upper extremity supported Standing balance-Leahy Scale: Zero                               Pertinent Vitals/Pain Pain Assessment: No/denies pain    Home Living Family/patient expects to be discharged to:: Private residence Living Arrangements: Spouse/significant other Available Help at Discharge: Family;Available 24 hours/day Type of Home: House Home Access: Stairs to enter Entrance Stairs-Rails: Right;Left Entrance Stairs-Number of Steps:  3   Home Layout: One level Home Equipment: None      Prior Function Prior Level of Function :  Independent/Modified Independent;Driving             Mobility Comments: enjoys bowling       Hand Dominance   Dominant Hand: Right    Extremity/Trunk Assessment   Upper Extremity Assessment Upper Extremity Assessment: Defer to OT evaluation    Lower Extremity Assessment Lower Extremity Assessment: RLE deficits/detail RLE Deficits / Details: Hip flexors 1/5 but otherwise 0/5 RLE Sensation: decreased light touch RLE Coordination: decreased fine motor;decreased gross motor    Cervical / Trunk Assessment Cervical / Trunk Assessment: Kyphotic  Communication   Communication: Expressive difficulties  Cognition Arousal/Alertness: Awake/alert Behavior During Therapy: Flat affect Overall Cognitive Status: Impaired/Different from baseline Area of Impairment: Attention;Following commands;Safety/judgement;Problem solving;Awareness                   Current Attention Level: Sustained   Following Commands: Follows one step commands with increased time;Follows multi-step commands inconsistently Safety/Judgement: Decreased awareness of safety Awareness: Emergent Problem Solving: Slow processing;Difficulty sequencing;Requires verbal cues;Requires tactile cues General Comments: A&Ox4. Follows commands with increased time. Difficult to fully assess due to patient not very conversive this session        General Comments General comments (skin integrity, edema, etc.): VSS on 2L O2 [place] during mobility    Exercises     Assessment/Plan    PT Assessment Patient needs continued PT services  PT Problem List Decreased strength;Decreased activity tolerance;Decreased balance;Decreased mobility;Decreased coordination;Decreased knowledge of use of DME;Decreased cognition;Decreased safety awareness;Cardiopulmonary status limiting activity;Impaired sensation       PT Treatment Interventions DME instruction;Gait training;Stair training;Functional mobility training;Therapeutic  activities;Therapeutic exercise;Balance training;Neuromuscular re-education;Patient/family education    PT Goals (Current goals can be found in the Care Plan section)  Acute Rehab PT Goals Patient Stated Goal: did not state PT Goal Formulation: With patient Time For Goal Achievement: 10/16/21 Potential to Achieve Goals: Good    Frequency Min 4X/week   Barriers to discharge        Co-evaluation PT/OT/SLP Co-Evaluation/Treatment: Yes Reason for Co-Treatment: For patient/therapist safety;To address functional/ADL transfers PT goals addressed during session: Mobility/safety with mobility;Balance         AM-PAC PT "6 Clicks" Mobility  Outcome Measure Help needed turning from your back to your side while in a flat bed without using bedrails?: Total Help needed moving from lying on your back to sitting on the side of a flat bed without using bedrails?: Total Help needed moving to and from a bed to a chair (including a wheelchair)?: Total Help needed standing up from a chair using your arms (e.g., wheelchair or bedside chair)?: Total Help needed to walk in hospital room?: Total Help needed climbing 3-5 steps with a railing? : Total 6 Click Score: 6    End of Session Equipment Utilized During Treatment: Gait belt;Oxygen Activity Tolerance: Patient tolerated treatment well Patient left: in bed;with call bell/phone within reach;with bed alarm set Nurse Communication: Mobility status PT Visit Diagnosis: Unsteadiness on feet (R26.81);Other abnormalities of gait and mobility (R26.89);Muscle weakness (generalized) (M62.81);History of falling (Z91.81);Difficulty in walking, not elsewhere classified (R26.2);Other symptoms and signs involving the nervous system (J67.341)    Time: 9379-0240 PT Time Calculation (min) (ACUTE ONLY): 31 min   Charges:   PT Evaluation $PT Eval Moderate Complexity: 1 Mod          Ranyah Groeneveld A. Gilford Rile PT, DPT Acute Rehabilitation Services Pager  [place]   Linna Hoff 10/02/2021, 10:52 AM

## 2021-10-03 DIAGNOSIS — I6300 Cerebral infarction due to thrombosis of unspecified precerebral artery: Secondary | ICD-10-CM

## 2021-10-03 LAB — CBC
HCT: 39 % (ref 36.0–46.0)
Hemoglobin: 13 g/dL (ref 12.0–15.0)
MCH: 32.3 pg (ref 26.0–34.0)
MCHC: 33.3 g/dL (ref 30.0–36.0)
MCV: 96.8 fL (ref 80.0–100.0)
Platelets: 241 10*3/uL (ref 150–400)
RBC: 4.03 MIL/uL (ref 3.87–5.11)
RDW: 13.2 % (ref 11.5–15.5)
WBC: 10.6 10*3/uL — ABNORMAL HIGH (ref 4.0–10.5)
nRBC: 0 % (ref 0.0–0.2)

## 2021-10-03 LAB — BASIC METABOLIC PANEL
Anion gap: 8 (ref 5–15)
BUN: 17 mg/dL (ref 8–23)
CO2: 22 mmol/L (ref 22–32)
Calcium: 9 mg/dL (ref 8.9–10.3)
Chloride: 108 mmol/L (ref 98–111)
Creatinine, Ser: 0.9 mg/dL (ref 0.44–1.00)
GFR, Estimated: >60 mL/min (ref >60)
Glucose, Bld: 99 mg/dL (ref 70–99)
Potassium: 3.5 mmol/L (ref 3.5–5.1)
Sodium: 138 mmol/L (ref 135–145)

## 2021-10-03 NOTE — Progress Notes (Signed)
Inpatient Rehab Admissions:  Inpatient Rehab Consult received.  I met with patient and sister-in-law at the bedside for rehabilitation assessment and to discuss goals and expectations of an inpatient rehab admission.  Both acknowledged understanding of CIR goals and expectations. Pt interested in pursuing CIR. Pt gave permission to contact son, Cletus Gash. Spoke with Cletus Gash on the telephone. Explained CIR goals and expectations. He acknowledged understanding. He is supportive of pt pursuing CIR. He confirmed that he and his wife will be able to provide 24/7 support/assistance to pt after discharge. Will continue to follow.  Signed: Gayland Curry, [place], [place] Admissions Coordinator [phone]

## 2021-10-03 NOTE — Progress Notes (Addendum)
STROKE TEAM PROGRESS NOTE   SUBJECTIVE (INTERVAL HISTORY) Husband at bedside. Patient is resting comfortably in the bed.  She still has mild right leg weakness but appears to be improving.  Vital signs are stable.  Neurological exam is unchanged.  CBC and BMP from this morning are unremarkable.   Therapies recommending inpatient rehab.  OBJECTIVE Vitals:   10/03/21 1000 10/03/21 1010 10/03/21 1100 10/03/21 1200  BP:  (!) 162/83 136/71   Pulse: 62 63 63   Resp: 16 18 16    Temp:    98.3 F (36.8 C)  TempSrc:    Axillary  SpO2: 94% 92% 94%   Weight:      Height:        CBC:  Recent Labs  Lab 10/01/21 1130 10/01/21 1148 10/02/21 0401 10/03/21 0328  WBC 7.9  --  14.7* 10.6*  NEUTROABS 6.2  --   --   --   HGB 15.2*   < > 13.5 13.0  HCT 44.3   < > 40.0 39.0  MCV 95.7  --  95.9 96.8  PLT 246  --  274 241   < > = values in this interval not displayed.     Basic Metabolic Panel:  Recent Labs  Lab 10/02/21 0401 10/03/21 0328  NA 135 138  K 3.7 3.5  CL 108 108  CO2 20* 22  GLUCOSE 128* 99  BUN 17 17  CREATININE 0.86 0.90  CALCIUM 8.6* 9.0     Lipid Panel:  Recent Labs  Lab 10/02/21 0401  CHOL 317*  TRIG 182*  HDL 49  CHOLHDL 6.5  VLDL 36  LDLCALC 232*    HgbA1c:  Lab Results  Component Value Date   HGBA1C 5.0 10/02/2021   Urine Drug Screen:     Component Value Date/Time   LABOPIA NONE DETECTED 10/01/2021 1723   COCAINSCRNUR NONE DETECTED 10/01/2021 1723   LABBENZ NONE DETECTED 10/01/2021 1723   AMPHETMU NONE DETECTED 10/01/2021 1723   THCU NONE DETECTED 10/01/2021 1723   LABBARB NONE DETECTED 10/01/2021 1723    Alcohol Level     Component Value Date/Time   ETH <10 10/01/2021 1131    IMAGING  Results for orders placed or performed during the hospital encounter of 10/01/21  MR BRAIN WO CONTRAST   Narrative   CLINICAL DATA:  Stroke, follow-up  EXAM: MRI HEAD WITHOUT CONTRAST  MRA HEAD WITHOUT CONTRAST  TECHNIQUE: Multiplanar,  multi-echo pulse sequences of the brain and surrounding structures were acquired without intravenous contrast. Angiographic images of the Circle of Willis were acquired using MRA technique without intravenous contrast.  COMPARISON:  MRI 10/01/2021, correlation is also made with CT head 10/01/2021 11:59 a.m. and 5:01 p.m.  FINDINGS: MRI HEAD FINDINGS  Evaluation is somewhat limited by motion artifact.  Brain: Redemonstrated areas of restricted diffusion in the medial left frontal and parietal lobes, in both the left ACA territory and watershed territory. Additional small area of restricted diffusion in the left inferior cerebellum (series 3, image 12), unchanged. The majority of the infarcts were seen on the prior exam, with the exception of a small area of restricted diffusion in the parafalcine right parietal lobe (series 3, image 37). Many of these areas are not associated with increased T2 signal, and there is susceptibility in the area of the left parafalcine frontal lobe (series 7, image 17), likely small amount of associated hemorrhage. Additional susceptibility is noted in the area of the left A2, corresponding to a stent.  No  mass, mass, or midline shift.  Vascular: Please see MRA findings below.  Skull and upper cervical spine: Normal marrow signal.  Sinuses/Orbits: Mucosal thickening in the left frontal sinus. The orbits are unremarkable.  Other: Fluid in left mastoid air cells.  MRA HEAD FINDINGS  Evaluation is somewhat limited by motion artifact.  Anterior circulation: Both internal carotid arteries are patent to the termini, without stenosis or other abnormality. A1 segments patent. Normal anterior communicating artery. Area of susceptibility in the course of the left A2, secondary to stent placement. Anterior cerebral arteries are otherwise patent to their distal aspects, although evaluation is limited by both motion and susceptibility. No M1 stenosis or  occlusion; irregularity in the left M1 is felt to be secondary to motion. Evaluation of the bifurcations and distal MCA branches is limited by motion, however these grossly appear patent.  Posterior circulation: Diminutive right vertebral artery that terminates primarily in PICA. Patent left vertebral artery. The basilar artery is patent. The superior cerebellar arteries are unremarkable. Fetal origin of the right PCA, with a patent posterior communicating artery and no definite visualization of a right P1. Possible narrowing of the proximal right P2, although evaluation is limited by motion. The left PCA appears patent. The left posterior communicating artery is not definitively visualized.  Anatomic variants: None significant  IMPRESSION: 1. Redemonstrated infarcts in the left ACA and ACA/PCA watershed territory, with one new area of restricted diffusion in the medial right parietal lobe, likely an additional acute infarct, status post A2 stenting. A small amount of associated petechial hemorrhage is noted within the medial left frontal lobe, without significant hemorrhagic transformation. 2. Evaluation of the vasculature is limited by motion. Within this limitation, there is no large vessel occlusion or significant stenosis. Evaluation of the left A2 is limited by both motion and susceptibility artifact from the stent. If further evaluation of the vasculature is indicated, consider CTA.   Electronically Signed   By: Merilyn Baba M.D.   On: 10/02/2021 14:04   MR ANGIO HEAD WO CONTRAST   Narrative   CLINICAL DATA:  Stroke, follow-up  EXAM: MRI HEAD WITHOUT CONTRAST  MRA HEAD WITHOUT CONTRAST  TECHNIQUE: Multiplanar, multi-echo pulse sequences of the brain and surrounding structures were acquired without intravenous contrast. Angiographic images of the Circle of Willis were acquired using MRA technique without intravenous contrast.  COMPARISON:  MRI 10/01/2021,  correlation is also made with CT head 10/01/2021 11:59 a.m. and 5:01 p.m.  FINDINGS: MRI HEAD FINDINGS  Evaluation is somewhat limited by motion artifact.  Brain: Redemonstrated areas of restricted diffusion in the medial left frontal and parietal lobes, in both the left ACA territory and watershed territory. Additional small area of restricted diffusion in the left inferior cerebellum (series 3, image 12), unchanged. The majority of the infarcts were seen on the prior exam, with the exception of a small area of restricted diffusion in the parafalcine right parietal lobe (series 3, image 37). Many of these areas are not associated with increased T2 signal, and there is susceptibility in the area of the left parafalcine frontal lobe (series 7, image 17), likely small amount of associated hemorrhage. Additional susceptibility is noted in the area of the left A2, corresponding to a stent.  No mass, mass, or midline shift.  Vascular: Please see MRA findings below.  Skull and upper cervical spine: Normal marrow signal.  Sinuses/Orbits: Mucosal thickening in the left frontal sinus. The orbits are unremarkable.  Other: Fluid in left mastoid air cells.  MRA  HEAD FINDINGS  Evaluation is somewhat limited by motion artifact.  Anterior circulation: Both internal carotid arteries are patent to the termini, without stenosis or other abnormality. A1 segments patent. Normal anterior communicating artery. Area of susceptibility in the course of the left A2, secondary to stent placement. Anterior cerebral arteries are otherwise patent to their distal aspects, although evaluation is limited by both motion and susceptibility. No M1 stenosis or occlusion; irregularity in the left M1 is felt to be secondary to motion. Evaluation of the bifurcations and distal MCA branches is limited by motion, however these grossly appear patent.  Posterior circulation: Diminutive right vertebral artery  that terminates primarily in PICA. Patent left vertebral artery. The basilar artery is patent. The superior cerebellar arteries are unremarkable. Fetal origin of the right PCA, with a patent posterior communicating artery and no definite visualization of a right P1. Possible narrowing of the proximal right P2, although evaluation is limited by motion. The left PCA appears patent. The left posterior communicating artery is not definitively visualized.  Anatomic variants: None significant  IMPRESSION: 1. Redemonstrated infarcts in the left ACA and ACA/PCA watershed territory, with one new area of restricted diffusion in the medial right parietal lobe, likely an additional acute infarct, status post A2 stenting. A small amount of associated petechial hemorrhage is noted within the medial left frontal lobe, without significant hemorrhagic transformation. 2. Evaluation of the vasculature is limited by motion. Within this limitation, there is no large vessel occlusion or significant stenosis. Evaluation of the left A2 is limited by both motion and susceptibility artifact from the stent. If further evaluation of the vasculature is indicated, consider CTA.   Electronically Signed   By: Merilyn Baba M.D.   On: 10/02/2021 14:04   CT HEAD WO CONTRAST   Narrative   CLINICAL DATA:  Stroke, follow-up status post mechanical thrombectomy and left A2 stenting  EXAM: CT HEAD WITHOUT CONTRAST  TECHNIQUE: Contiguous axial images were obtained from the base of the skull through the vertex without intravenous contrast.  COMPARISON:  10/01/2021  FINDINGS: Brain: Redemonstrated mild loss of gray-white differentiation in the medial left frontal lobe, consistent with known left ACA occlusion, although the loss of gray-white is less distinct on the current exam. No new area of infarction. No hemorrhage, mass, mass effect, or midline shift. No hydrocephalus or extra-axial  collection.  Vascular: Interval placement of left A2 stent. No hyperdense vessel. Atherosclerotic calcifications in the intracranial carotid and vertebral arteries.  Skull: Normal. Negative for fracture or focal lesion.  Sinuses/Orbits: Mucosal thickening in the left frontal sinus and left anterior ethmoid air cells. The orbits are unremarkable.  Other: Fluid in left mastoid air cells.  IMPRESSION: Status post placement of a left A2 stent, with diminished conspicuity of previously noted loss of gray-white differentiation in the medial left frontal lobe. No new area of infarction.   Electronically Signed   By: Merilyn Baba M.D.   On: 10/01/2021 18:09        PHYSICAL EXAM  Physical Exam  Constitutional: Appears well-developed and well-nourished.  Psych: Affect appropriate to situation Eyes: No scleral injection HENT: No OP obstrucion MSK: bilateral upper extremity tremor  Cardiovascular: Normal rate and regular rhythm.  Respiratory: Effort normal, non-labored breathing, 2LNC in place GI: Soft.  No distension. There is no tenderness.  Skin: WDI  Neuro: Mental Status: Patient is awake, alert, oriented to person, place, month, year, and situation. Patient is able to give a clear and coherent history. No aphasia but  paucity of speech. Patient is able to follow all simple commands.   Cranial Nerves: II: Visual Fields are full. Pupils are equal, round, and reactive to light.   III,IV, VI: EOMI without ptosis or diploplia.  V: Facial sensation is symmetric to temperature VII: Mild right nasolabial fold flattening.  VIII: Hearing is intact to voice X: Palate elevates symmetrically XI: Shoulder shrug is symmetric. XII: Tongue protrudes midline.  Motor: Tone is normal. Bulk is normal.  RUE pronator drift: proximal 4/5;  distal 4+/5 RLE 3/5 LUE and LLE 5/5.  Sensory: Sensation is symmetric to light touch in the arms and legs. No extinction to DSS present.   Cerebellar: FTN intact, slow on the right.   Gait not tested for safety.     ASSESSMENT/PLAN [name] is a 70 y.o. female 70 year old female with history of HLD admitted for right-sided weakness, aphasia.   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 cardioembolic CT head left ACA infarct CTA head and neck- Left A2/ACA occulusion CTP 0 core, 27 cc penumbra Stat MRI showed left ACA infarct, however there is viable tissue compared with CTP IR - left A2 occlusion s/p TICI3 revascularization and left A2 stent MRI head 11/18 - Redemonstrated infarcts in the left ACA and ACA/PCA watershed territory, with one new area of restricted diffusion in the medial right parietal lobe, likely an additional acute infarct, status post A2 stenting.  MRA head post A2 stenting, distal A2 seems patent.  2D Echo EF 65 to 70% LDL 232 HgbA1c 5.0 Lovenox for VTE prophylaxis No antithrombotic prior to admission, now on Brilinta (ticagrelor) 90 mg bid and ASA 81mg  Ongoing aggressive stroke risk factor management Therapy recommendations:  Acute inpatient rehab-  consult placed Disposition:  Neuro ICU  Hypertension Then BP less than 180/105 Long-term BP goal normotensive PRN labetolol   Hyperlipidemia Home meds:  None  LDL 232, goal < 70 Lipitor 80mg  daily Continue statin at discharge  Tobacco abuse Current smoker Smoking cessation counseling provided Nicotine patch provided Pt is willing to quit  Other Stroke Risk Factors B/l arm tremor - chronic    Hospital day # 2   Pt seen by NP/Neuro and later by MD. Note/plan to be edited by MD as needed.  Janine Ores, FNP-BC [facility]  STROKE MD NOTE :  I have personally obtained history,examined this patient, reviewed notes, independently viewed imaging studies, participated in medical  decision making and plan of care.ROS completed by me personally and pertinent positives fully documented  I have made any additions or clarifications directly to the above note. Agree with note above.  Continue mobilize out of bed.  Ongoing therapies.  Transfer to neurology floor bed if available later today.  Likely need inpatient rehab next week.  Continue aspirin and Brilinta.This patient is critically ill and at significant risk of neurological worsening, death and care requires constant monitoring of vital signs, hemodynamics,respiratory and cardiac monitoring, extensive review of multiple databases, frequent neurological assessment, discussion with family, other specialists and medical decision making of high complexity.I have made any additions or clarifications directly to the above note.This critical care time does not reflect procedure time, or teaching time or supervisory time of PA/NP/Med Resident etc but could involve care discussion time.  I spent 30 minutes of neurocritical care time  in the care of  this patient.  Antony Contras, MD Medical Director [place] Pager: [phone] 10/03/2021 3:21 PM   To contact Stroke Continuity provider, please refer to [email or web address] After hours, contact General Neurology

## 2021-10-04 DIAGNOSIS — I6300 Cerebral infarction due to thrombosis of unspecified precerebral artery: Secondary | ICD-10-CM | POA: Diagnosis not present

## 2021-10-04 LAB — CBC
HCT: 38.2 % (ref 36.0–46.0)
Hemoglobin: 13.1 g/dL (ref 12.0–15.0)
MCH: 33.2 pg (ref 26.0–34.0)
MCHC: 34.3 g/dL (ref 30.0–36.0)
MCV: 97 fL (ref 80.0–100.0)
Platelets: 219 10*3/uL (ref 150–400)
RBC: 3.94 MIL/uL (ref 3.87–5.11)
RDW: 13 % (ref 11.5–15.5)
WBC: 8.1 10*3/uL (ref 4.0–10.5)
nRBC: 0 % (ref 0.0–0.2)

## 2021-10-04 LAB — BASIC METABOLIC PANEL
Anion gap: 7 (ref 5–15)
BUN: 20 mg/dL (ref 8–23)
CO2: 23 mmol/L (ref 22–32)
Calcium: 8.8 mg/dL — ABNORMAL LOW (ref 8.9–10.3)
Chloride: 105 mmol/L (ref 98–111)
Creatinine, Ser: 0.9 mg/dL (ref 0.44–1.00)
GFR, Estimated: >60 mL/min (ref >60)
Glucose, Bld: 111 mg/dL — ABNORMAL HIGH (ref 70–99)
Potassium: 3.8 mmol/L (ref 3.5–5.1)
Sodium: 135 mmol/L (ref 135–145)

## 2021-10-04 NOTE — Progress Notes (Signed)
   10/04/21 1730  Vitals  BP (!) 175/94  MAP (mmHg) 116  BP Location Left Arm  BP Method Automatic  Patient Position (if appropriate) Lying  Pulse Rate (!) 58  Resp 16  MEWS COLOR  MEWS Score Color Green  Oxygen Therapy  SpO2 94 %  O2 Device Room Air  MEWS Score  MEWS Temp 0  MEWS Systolic 0  MEWS Pulse 0  MEWS RR 0  MEWS LOC 0  MEWS Score 0  Provider Notification  Provider Name/Title Dr. Leonie Man  Date Provider Notified 10/04/21  Time Provider Notified [id number]  Notification Type Page  Notification Reason Other (Comment) (ineffective prn medication)  Provider response Other (Comment) (No response)

## 2021-10-04 NOTE — Progress Notes (Addendum)
STROKE TEAM PROGRESS NOTE   SUBJECTIVE (INTERVAL HISTORY) Husband at bedside. Patient is resting comfortably in the bed.  She still has mild right leg weakness but appears to be improving.  Vital signs are stable.  Neurological exam is unchanged.  CBC and BMP from this morning are unremarkable. Therapies recommending inpatient rehab.  OBJECTIVE Vitals:   10/04/21 0200 10/04/21 0323 10/04/21 0721 10/04/21 1057  BP: (!) 169/13  (!) 170/85 (!) 158/78  Pulse: (!) 53  (!) 54 61  Resp: 16  13 10   Temp:  98.9 F (37.2 C) 98.1 F (36.7 C) 97.9 F (36.6 C)  TempSrc:  Oral Oral Oral  SpO2: 93%  96% 95%  Weight:      Height:        CBC:  Recent Labs  Lab 10/01/21 1130 10/01/21 1148 10/03/21 0328 10/04/21 0348  WBC 7.9   < > 10.6* 8.1  NEUTROABS 6.2  --   --   --   HGB 15.2*   < > 13.0 13.1  HCT 44.3   < > 39.0 38.2  MCV 95.7   < > 96.8 97.0  PLT 246   < > 241 219   < > = values in this interval not displayed.     Basic Metabolic Panel:  Recent Labs  Lab 10/03/21 0328 10/04/21 0348  NA 138 135  K 3.5 3.8  CL 108 105  CO2 22 23  GLUCOSE 99 111*  BUN 17 20  CREATININE 0.90 0.90  CALCIUM 9.0 8.8*     Lipid Panel:  Recent Labs  Lab 10/02/21 0401  CHOL 317*  TRIG 182*  HDL 49  CHOLHDL 6.5  VLDL 36  LDLCALC 232*    HgbA1c:  Lab Results  Component Value Date   HGBA1C 5.0 10/02/2021   Urine Drug Screen:     Component Value Date/Time   LABOPIA NONE DETECTED 10/01/2021 1723   COCAINSCRNUR NONE DETECTED 10/01/2021 1723   LABBENZ NONE DETECTED 10/01/2021 1723   AMPHETMU NONE DETECTED 10/01/2021 1723   THCU NONE DETECTED 10/01/2021 1723   LABBARB NONE DETECTED 10/01/2021 1723    Alcohol Level     Component Value Date/Time   ETH <10 10/01/2021 1131    IMAGING  Results for orders placed or performed during the hospital encounter of 10/01/21  MR BRAIN WO CONTRAST   Narrative   CLINICAL DATA:  Stroke, follow-up  EXAM: MRI HEAD WITHOUT  CONTRAST  MRA HEAD WITHOUT CONTRAST  TECHNIQUE: Multiplanar, multi-echo pulse sequences of the brain and surrounding structures were acquired without intravenous contrast. Angiographic images of the Circle of Willis were acquired using MRA technique without intravenous contrast.  COMPARISON:  MRI 10/01/2021, correlation is also made with CT head 10/01/2021 11:59 a.m. and 5:01 p.m.  FINDINGS: MRI HEAD FINDINGS  Evaluation is somewhat limited by motion artifact.  Brain: Redemonstrated areas of restricted diffusion in the medial left frontal and parietal lobes, in both the left ACA territory and watershed territory. Additional small area of restricted diffusion in the left inferior cerebellum (series 3, image 12), unchanged. The majority of the infarcts were seen on the prior exam, with the exception of a small area of restricted diffusion in the parafalcine right parietal lobe (series 3, image 37). Many of these areas are not associated with increased T2 signal, and there is susceptibility in the area of the left parafalcine frontal lobe (series 7, image 17), likely small amount of associated hemorrhage. Additional susceptibility is noted in the  area of the left A2, corresponding to a stent.  No mass, mass, or midline shift.  Vascular: Please see MRA findings below.  Skull and upper cervical spine: Normal marrow signal.  Sinuses/Orbits: Mucosal thickening in the left frontal sinus. The orbits are unremarkable.  Other: Fluid in left mastoid air cells.  MRA HEAD FINDINGS  Evaluation is somewhat limited by motion artifact.  Anterior circulation: Both internal carotid arteries are patent to the termini, without stenosis or other abnormality. A1 segments patent. Normal anterior communicating artery. Area of susceptibility in the course of the left A2, secondary to stent placement. Anterior cerebral arteries are otherwise patent to their distal aspects, although evaluation  is limited by both motion and susceptibility. No M1 stenosis or occlusion; irregularity in the left M1 is felt to be secondary to motion. Evaluation of the bifurcations and distal MCA branches is limited by motion, however these grossly appear patent.  Posterior circulation: Diminutive right vertebral artery that terminates primarily in PICA. Patent left vertebral artery. The basilar artery is patent. The superior cerebellar arteries are unremarkable. Fetal origin of the right PCA, with a patent posterior communicating artery and no definite visualization of a right P1. Possible narrowing of the proximal right P2, although evaluation is limited by motion. The left PCA appears patent. The left posterior communicating artery is not definitively visualized.  Anatomic variants: None significant  IMPRESSION: 1. Redemonstrated infarcts in the left ACA and ACA/PCA watershed territory, with one new area of restricted diffusion in the medial right parietal lobe, likely an additional acute infarct, status post A2 stenting. A small amount of associated petechial hemorrhage is noted within the medial left frontal lobe, without significant hemorrhagic transformation. 2. Evaluation of the vasculature is limited by motion. Within this limitation, there is no large vessel occlusion or significant stenosis. Evaluation of the left A2 is limited by both motion and susceptibility artifact from the stent. If further evaluation of the vasculature is indicated, consider CTA.   Electronically Signed   By: Merilyn Baba M.D.   On: 10/02/2021 14:04   MR ANGIO HEAD WO CONTRAST   Narrative   CLINICAL DATA:  Stroke, follow-up  EXAM: MRI HEAD WITHOUT CONTRAST  MRA HEAD WITHOUT CONTRAST  TECHNIQUE: Multiplanar, multi-echo pulse sequences of the brain and surrounding structures were acquired without intravenous contrast. Angiographic images of the Circle of Willis were acquired using MRA  technique without intravenous contrast.  COMPARISON:  MRI 10/01/2021, correlation is also made with CT head 10/01/2021 11:59 a.m. and 5:01 p.m.  FINDINGS: MRI HEAD FINDINGS  Evaluation is somewhat limited by motion artifact.  Brain: Redemonstrated areas of restricted diffusion in the medial left frontal and parietal lobes, in both the left ACA territory and watershed territory. Additional small area of restricted diffusion in the left inferior cerebellum (series 3, image 12), unchanged. The majority of the infarcts were seen on the prior exam, with the exception of a small area of restricted diffusion in the parafalcine right parietal lobe (series 3, image 37). Many of these areas are not associated with increased T2 signal, and there is susceptibility in the area of the left parafalcine frontal lobe (series 7, image 17), likely small amount of associated hemorrhage. Additional susceptibility is noted in the area of the left A2, corresponding to a stent.  No mass, mass, or midline shift.  Vascular: Please see MRA findings below.  Skull and upper cervical spine: Normal marrow signal.  Sinuses/Orbits: Mucosal thickening in the left frontal sinus. The orbits are  unremarkable.  Other: Fluid in left mastoid air cells.  MRA HEAD FINDINGS  Evaluation is somewhat limited by motion artifact.  Anterior circulation: Both internal carotid arteries are patent to the termini, without stenosis or other abnormality. A1 segments patent. Normal anterior communicating artery. Area of susceptibility in the course of the left A2, secondary to stent placement. Anterior cerebral arteries are otherwise patent to their distal aspects, although evaluation is limited by both motion and susceptibility. No M1 stenosis or occlusion; irregularity in the left M1 is felt to be secondary to motion. Evaluation of the bifurcations and distal MCA branches is limited by motion, however these grossly appear  patent.  Posterior circulation: Diminutive right vertebral artery that terminates primarily in PICA. Patent left vertebral artery. The basilar artery is patent. The superior cerebellar arteries are unremarkable. Fetal origin of the right PCA, with a patent posterior communicating artery and no definite visualization of a right P1. Possible narrowing of the proximal right P2, although evaluation is limited by motion. The left PCA appears patent. The left posterior communicating artery is not definitively visualized.  Anatomic variants: None significant  IMPRESSION: 1. Redemonstrated infarcts in the left ACA and ACA/PCA watershed territory, with one new area of restricted diffusion in the medial right parietal lobe, likely an additional acute infarct, status post A2 stenting. A small amount of associated petechial hemorrhage is noted within the medial left frontal lobe, without significant hemorrhagic transformation. 2. Evaluation of the vasculature is limited by motion. Within this limitation, there is no large vessel occlusion or significant stenosis. Evaluation of the left A2 is limited by both motion and susceptibility artifact from the stent. If further evaluation of the vasculature is indicated, consider CTA.   Electronically Signed   By: Merilyn Baba M.D.   On: 10/02/2021 14:04   CT HEAD WO CONTRAST   Narrative   CLINICAL DATA:  Stroke, follow-up status post mechanical thrombectomy and left A2 stenting  EXAM: CT HEAD WITHOUT CONTRAST  TECHNIQUE: Contiguous axial images were obtained from the base of the skull through the vertex without intravenous contrast.  COMPARISON:  10/01/2021  FINDINGS: Brain: Redemonstrated mild loss of gray-white differentiation in the medial left frontal lobe, consistent with known left ACA occlusion, although the loss of gray-white is less distinct on the current exam. No new area of infarction. No hemorrhage, mass, mass effect, or  midline shift. No hydrocephalus or extra-axial collection.  Vascular: Interval placement of left A2 stent. No hyperdense vessel. Atherosclerotic calcifications in the intracranial carotid and vertebral arteries.  Skull: Normal. Negative for fracture or focal lesion.  Sinuses/Orbits: Mucosal thickening in the left frontal sinus and left anterior ethmoid air cells. The orbits are unremarkable.  Other: Fluid in left mastoid air cells.  IMPRESSION: Status post placement of a left A2 stent, with diminished conspicuity of previously noted loss of gray-white differentiation in the medial left frontal lobe. No new area of infarction.   Electronically Signed   By: Merilyn Baba M.D.   On: 10/01/2021 18:09        PHYSICAL EXAM  Physical Exam  Constitutional: Appears well-developed and well-nourished elderly Caucasian lady.  Psych: Affect appropriate to situation Eyes: No scleral injection HENT: No OP obstrucion MSK: bilateral upper extremity tremor  Cardiovascular: Normal rate and regular rhythm.  Respiratory: Effort normal, non-labored breathing, 2LNC in place GI: Soft.  No distension. There is no tenderness.  Skin: WDI  Neuro: Mental Status: Patient is awake, alert, oriented to person, place, month, year, and  situation. Patient is able to give a clear and coherent history. No aphasia but paucity of speech. Patient is able to follow all simple commands.   Cranial Nerves: II: Visual Fields are full. Pupils are equal, round, and reactive to light.   III,IV, VI: EOMI without ptosis or diploplia.  V: Facial sensation is symmetric to temperature VII: Mild right nasolabial fold flattening.  VIII: Hearing is intact to voice X: Palate elevates symmetrically XI: Shoulder shrug is symmetric. XII: Tongue protrudes midline.  Motor: Tone is normal. Bulk is normal.  RUE pronator drift: proximal 4/5;  distal 4+/5 RLE 3/5 LUE and LLE 5/5.  Sensory: Sensation is symmetric to light  touch in the arms and legs. No extinction to DSS present.  Cerebellar: FTN intact, slow on the right.   Gait not tested for safety.     ASSESSMENT/PLAN Ms. AALIJAH MIMS is a 70 y.o. female 70 year old female with history of HLD admitted for right-sided weakness, aphasia.  She is being evaluated for admission to rehab. Continue to mobilize patient out of bed.  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 cardioembolic CT head left ACA infarct CTA head and neck- Left A2/ACA occulusion CTP 0 core, 27 cc penumbra Stat MRI showed left ACA infarct, however there is viable tissue compared with CTP IR - left A2 occlusion s/p TICI3 revascularization and left A2 stent MRI head 11/18 - Redemonstrated infarcts in the left ACA and ACA/PCA watershed territory, with one new area of restricted diffusion in the medial right parietal lobe, likely an additional acute infarct, status post A2 stenting.  MRA head post A2 stenting, distal A2 seems patent.  2D Echo EF 65 to 70% LDL 232 HgbA1c 5.0 Lovenox for VTE prophylaxis No antithrombotic prior to admission, now on Brilinta (ticagrelor) 90 mg bid and ASA 81mg  Ongoing aggressive stroke risk factor management Therapy recommendations:  Acute inpatient rehab-  consult placed and received Disposition:  Neuro Progressive  Hypertension Then BP less than 180/105 Long-term BP goal normotensive PRN labetolol   Hyperlipidemia Home meds:  None  LDL 232, goal < 70 Lipitor 80mg  daily Continue statin at discharge  Tobacco abuse Current smoker Smoking cessation counseling provided Nicotine patch provided Pt is willing to quit  Other Stroke Risk Factors B/l arm tremor - chronic    Hospital day # 3   Pt seen by NP/Neuro and later by MD. Note/plan to be edited by MD as needed.  Janine Ores, FNP-BC [facility] I have personally obtained history,examined this patient, reviewed notes, independently viewed imaging studies, participated in medical decision making and plan of care.ROS completed by me personally and pertinent positives fully documented  I have made any additions or clarifications directly to the above note. Agree with note above.  Continue mobilization out of bed and ongoing therapy consults.  Transfer to inpatient rehab bed within the next few days after insurance approval and bed availability.  Long invocation, aspirin and Claritin with patient and husband at the bedside and answered questions.  Greater than 50% time during this 35-minute visit was spent in counseling and coordination of care about health stroke and rehabilitation needs discussion with patient, husband and care team and answering questions  Antony Contras, MD Medical Director [place] Pager: [phone] 10/04/2021 1:43 PM   To contact Stroke Continuity provider, please refer to [email or web address] After hours,  contact General Neurology

## 2021-10-04 NOTE — Progress Notes (Signed)
Physical Therapy Treatment Patient Details Name: Andrea Williamson MRN: [id number] DOB: 1951-07-17 Today's Date: 10/04/2021   History of Present Illness 70 y/o female presented to ED on 11/17 after a fall with R LE weakness and aphasia. 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 S/p mechanical thrombectomy of L ACA. PMH: HLD    PT Comments    Pt making steady progress towards her physical therapy goals, displaying improved right leg weakness. Pt able to activate right glute with supine bridging. Requiring two person moderate assist and a walker to take pivotal steps over to the recliner. Performed serial sit to stands from chair for functional strengthening and progressive weightbearing. Suspect excellent progress given PLOF, activity tolerance and motivation. Continue to recommend IPR to address deficits and maximize functional mobility.     Recommendations for follow up therapy are one component of a multi-disciplinary discharge planning process, led by the attending physician.  Recommendations may be updated based on patient status, additional functional criteria and insurance authorization.  Follow Up Recommendations  Acute inpatient rehab (3hours/day)     Assistance Recommended at Discharge Frequent or constant Supervision/Assistance  Equipment Recommendations  Rolling walker (2 wheels);BSC/3in1;Wheelchair (measurements PT);Wheelchair cushion (measurements PT)    Recommendations for Other Services       Precautions / Restrictions Precautions Precautions: Fall;Other (comment) Precaution Comments: watch O2, HR Restrictions Weight Bearing Restrictions: No     Mobility  Bed Mobility Overal bed mobility: Needs Assistance Bed Mobility: Supine to Sit     Supine to sit: Min assist     General bed mobility comments: Pt initiating well, bring BLE's off edge of bed with cues, minA at trunk to bring upright     Transfers Overall transfer level: Needs assistance Equipment used: Rolling walker (2 wheels) Transfers: Sit to/from Stand;Bed to chair/wheelchair/BSC Sit to Stand: Mod assist;+2 physical assistance;+2 safety/equipment     Step pivot transfers: Mod assist;+2 physical assistance     General transfer comment: Pt requiring modA + 2 from edge of bed to walker, keeping RLE slightly anteriorly, cues for hand placement and "nose over toes." Taking pivotal steps from bed to chair towards right with step by step cues for sequencing and technique. Pt then performing x 3 serial sit to stands from chair to walker    Ambulation/Gait                   Stairs             Wheelchair Mobility    Modified Rankin (Stroke Patients Only) Modified Rankin (Stroke Patients Only) Pre-Morbid Rankin Score: No symptoms Modified Rankin: Moderately severe disability     Balance Overall balance assessment: Needs assistance Sitting-balance support: Single extremity supported;Feet supported Sitting balance-Leahy Scale: Fair Sitting balance - Comments: Initial right lateral lean but able to correct with min verbal cues   Standing balance support: Bilateral upper extremity supported Standing balance-Leahy Scale: Poor                              Cognition Arousal/Alertness: Awake/alert Behavior During Therapy: Flat affect Overall Cognitive Status: Impaired/Different from baseline Area of Impairment: Attention;Following commands;Safety/judgement;Problem solving;Awareness                   Current Attention Level: Sustained   Following Commands: Follows one step commands with increased time;Follows multi-step commands inconsistently Safety/Judgement: Decreased awareness of safety  Awareness: Emergent Problem Solving: Difficulty sequencing;Requires verbal cues General Comments: A&Ox4, follows one step commands, however, poor carry over with tasks, requiring repetition  each time        Exercises General Exercises - Lower Extremity Long Arc Quad: AAROM;Right;10 reps;Seated Other Exercises Other Exercises: Supine: bridging x 10    General Comments        Pertinent Vitals/Pain Pain Assessment: No/denies pain    Home Living                          Prior Function            PT Goals (current goals can now be found in the care plan section) Acute Rehab PT Goals Patient Stated Goal: to walk Potential to Achieve Goals: Good Progress towards PT goals: Progressing toward goals    Frequency    Min 4X/week      PT Plan Current plan remains appropriate    Co-evaluation              AM-PAC PT "6 Clicks" Mobility   Outcome Measure  Help needed turning from your back to your side while in a flat bed without using bedrails?: A Little Help needed moving from lying on your back to sitting on the side of a flat bed without using bedrails?: A Lot Help needed moving to and from a bed to a chair (including a wheelchair)?: A Lot Help needed standing up from a chair using your arms (e.g., wheelchair or bedside chair)?: Total Help needed to walk in hospital room?: Total Help needed climbing 3-5 steps with a railing? : Total 6 Click Score: 10    End of Session Equipment Utilized During Treatment: Gait belt;Oxygen Activity Tolerance: Patient tolerated treatment well Patient left: in chair;with call bell/phone within reach;with chair alarm set Nurse Communication: Mobility status PT Visit Diagnosis: Unsteadiness on feet (R26.81);Other abnormalities of gait and mobility (R26.89);Muscle weakness (generalized) (M62.81);History of falling (Z91.81);Difficulty in walking, not elsewhere classified (R26.2);Other symptoms and signs involving the nervous system (H68.088)     Time: 1206-1223 PT Time Calculation (min) (ACUTE ONLY): 17 min  Charges:  $Therapeutic Activity: 8-22 mins                     Andrea Williamson, PT, DPT Acute  Rehabilitation Services Pager [phone] Office [phone]    Andrea Williamson 10/04/2021, 2:19 PM

## 2021-10-05 ENCOUNTER — Encounter (HOSPITAL_COMMUNITY): Payer: Self-pay

## 2021-10-05 DIAGNOSIS — I6300 Cerebral infarction due to thrombosis of unspecified precerebral artery: Secondary | ICD-10-CM | POA: Diagnosis not present

## 2021-10-05 HISTORY — PX: IR CT HEAD LTD: IMG2386

## 2021-10-05 LAB — CBC
HCT: 41.4 % (ref 36.0–46.0)
Hemoglobin: 14 g/dL (ref 12.0–15.0)
MCH: 32.6 pg (ref 26.0–34.0)
MCHC: 33.8 g/dL (ref 30.0–36.0)
MCV: 96.3 fL (ref 80.0–100.0)
Platelets: 236 10*3/uL (ref 150–400)
RBC: 4.3 MIL/uL (ref 3.87–5.11)
RDW: 12.7 % (ref 11.5–15.5)
WBC: 9.4 10*3/uL (ref 4.0–10.5)
nRBC: 0 % (ref 0.0–0.2)

## 2021-10-05 LAB — BASIC METABOLIC PANEL
Anion gap: 8 (ref 5–15)
BUN: 27 mg/dL — ABNORMAL HIGH (ref 8–23)
CO2: 23 mmol/L (ref 22–32)
Calcium: 9.2 mg/dL (ref 8.9–10.3)
Chloride: 105 mmol/L (ref 98–111)
Creatinine, Ser: 0.87 mg/dL (ref 0.44–1.00)
GFR, Estimated: >60 mL/min (ref >60)
Glucose, Bld: 107 mg/dL — ABNORMAL HIGH (ref 70–99)
Potassium: 3.8 mmol/L (ref 3.5–5.1)
Sodium: 136 mmol/L (ref 135–145)

## 2021-10-05 MED ORDER — LABETALOL HCL 5 MG/ML IV SOLN
20.0000 mg | INTRAVENOUS | Status: DC | PRN
  Administered 2021-10-05 (×3): 20 mg via INTRAVENOUS
  Administered 2021-10-06: 40 mg via INTRAVENOUS
  Administered 2021-10-06 (×2): 20 mg via INTRAVENOUS
  Filled 2021-10-05: qty 4
  Filled 2021-10-05 (×3): qty 8

## 2021-10-05 MED ORDER — AMLODIPINE BESYLATE 5 MG PO TABS
5.0000 mg | ORAL_TABLET | Freq: Every day | ORAL | Status: DC
  Administered 2021-10-05 – 2021-10-07 (×3): 5 mg via ORAL
  Filled 2021-10-05 (×3): qty 1

## 2021-10-05 NOTE — Progress Notes (Signed)
Physical Therapy Treatment Patient Details Name: Andrea Williamson MRN: [id number] DOB: 1951-03-03 Today's Date: 10/05/2021   History of Present Illness 70 y/o female presented to ED on 11/17 after a fall with R LE weakness and aphasia. 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 S/p mechanical thrombectomy of L ACA. PMH: HLD    PT Comments    Pt up in chair upon PT and OT arrival to room, seen with both disciplines to address R-sided deficits and balance with mobility progression. PT focused intervention on pre-gait and gait training, pt with RLE weakness and incoordination during gait requiring max cuing for sequencing. Pt also with heavy R lateral leaning in both seated and standing tasks, pt becoming increasingly aware of preference for R lateral lean throughout session. Pt remains an excellent IPR candidate, will continue to follow.    Recommendations for follow up therapy are one component of a multi-disciplinary discharge planning process, led by the attending physician.  Recommendations may be updated based on patient status, additional functional criteria and insurance authorization.  Follow Up Recommendations  Acute inpatient rehab (3hours/day)     Assistance Recommended at Discharge Frequent or constant Supervision/Assistance  Equipment Recommendations  Rolling walker (2 wheels);BSC/3in1;Wheelchair (measurements PT);Wheelchair cushion (measurements PT)    Recommendations for Other Services       Precautions / Restrictions Precautions Precautions: Fall;Other (comment) Precaution Comments: watch HR - periods of tachycardia Restrictions Weight Bearing Restrictions: No     Mobility  Bed Mobility Overal bed mobility: Needs Assistance             General bed mobility comments: up in chair    Transfers Overall transfer level: Needs assistance Equipment used: 2 person hand held assist Transfers: Sit to/from  Stand Sit to Stand: Mod assist;+2 physical assistance;+2 safety/equipment     Step pivot transfers: Mod assist;+2 physical assistance     General transfer comment: mod +2 for power up, rise, correcting R lateral bias, and steadying upon standing. Close guarding RLE. STS x4 from recliner    Ambulation/Gait Ambulation/Gait assistance: Mod assist;+2 physical assistance Gait Distance (Feet): 8 Feet Assistive device: 2 person hand held assist Gait Pattern/deviations: Step-through pattern;Decreased stride length;Trunk flexed;Drifts right/left;Decreased step length - right Gait velocity: decr   Pre-gait activities: forward and backward stepping RLE x5, standing marches x2 bilat General Gait Details: Pt with UEs draped over PT and OT shoulders, "3 muskateers" gait. Assist to steady, correct balance, step-by-step cuing for sequencing gait and progressing RLE   Stairs             Wheelchair Mobility    Modified Rankin (Stroke Patients Only) Modified Rankin (Stroke Patients Only) Pre-Morbid Rankin Score: No symptoms Modified Rankin: Moderately severe disability     Balance Overall balance assessment: Needs assistance   Sitting balance-Leahy Scale: Fair   Postural control: Right lateral lean;Posterior lean Standing balance support: Bilateral upper extremity supported;During functional activity Standing balance-Leahy Scale: Poor Standing balance comment: reliant on external assist                            Cognition Arousal/Alertness: Awake/alert Behavior During Therapy: WFL for tasks assessed/performed Overall Cognitive Status: Impaired/Different from baseline Area of Impairment: Attention;Following commands;Safety/judgement;Problem solving;Awareness;Memory                   Current Attention Level: Sustained Memory: Decreased short-term memory Following Commands: Follows one step  commands with increased time;Follows multi-step commands  inconsistently Safety/Judgement: Decreased awareness of safety;Decreased awareness of deficits Awareness: Emergent Problem Solving: Difficulty sequencing;Requires verbal cues General Comments: Pt requiring max cues throughout session to correct R and posterior leaning. At times pt inappropriately laughing during session, when losing balance especially.        Exercises      General Comments General comments (skin integrity, edema, etc.): HR ranged from 60s up to 200s, unsure of accuracy but RN notifed. BP 142/108 at end of session      Pertinent Vitals/Pain Pain Assessment: No/denies pain    Home Living                          Prior Function            PT Goals (current goals can now be found in the care plan section) Acute Rehab PT Goals Patient Stated Goal: to walk PT Goal Formulation: With patient Time For Goal Achievement: 10/16/21 Potential to Achieve Goals: Good Progress towards PT goals: Progressing toward goals    Frequency    Min 4X/week      PT Plan Current plan remains appropriate    Co-evaluation PT/OT/SLP Co-Evaluation/Treatment: Yes Reason for Co-Treatment: For patient/therapist safety;To address functional/ADL transfers PT goals addressed during session: Mobility/safety with mobility;Balance OT goals addressed during session: ADL's and self-care      AM-PAC PT "6 Clicks" Mobility   Outcome Measure  Help needed turning from your back to your side while in a flat bed without using bedrails?: A Little Help needed moving from lying on your back to sitting on the side of a flat bed without using bedrails?: A Lot Help needed moving to and from a bed to a chair (including a wheelchair)?: A Lot Help needed standing up from a chair using your arms (e.g., wheelchair or bedside chair)?: A Lot Help needed to walk in hospital room?: Total Help needed climbing 3-5 steps with a railing? : Total 6 Click Score: 11    End of Session Equipment  Utilized During Treatment: Gait belt Activity Tolerance: Patient tolerated treatment well Patient left: in chair;with call bell/phone within reach;with chair alarm set Nurse Communication: Mobility status PT Visit Diagnosis: Unsteadiness on feet (R26.81);Other abnormalities of gait and mobility (R26.89);Muscle weakness (generalized) (M62.81);History of falling (Z91.81);Difficulty in walking, not elsewhere classified (R26.2);Other symptoms and signs involving the nervous system (R29.898)     Time: 1007-1219 PT Time Calculation (min) (ACUTE ONLY): 29 min  Charges:  $Gait Training: 8-22 mins                     Stacie Glaze, PT DPT Acute Rehabilitation Services Pager [phone]  Office [phone]    [place] 10/05/2021, 2:20 PM

## 2021-10-05 NOTE — Progress Notes (Addendum)
STROKE TEAM PROGRESS NOTE   SUBJECTIVE (INTERVAL HISTORY) Patient is seen in her room with her husband at the bedside. She has been hemodynamically stable with no acute events overnight. She continues to have subtle right sided weakness and is ready to be discharged to CIR. Her husband is at the bedside.  Vital signs stable.  Neurological exam shows improving leg strength OBJECTIVE Vitals:   10/05/21 0950 10/05/21 0955 10/05/21 1022 10/05/21 1158  BP: (!) 177/82 (!) 171/94 (!) 150/111 (!) 167/95  Pulse: (!) 59 (!) 58 60   Resp: 19 12 14    Temp:    98.3 F (36.8 C)  TempSrc:    Oral  SpO2: (!) 79% 92% (!) 89%   Weight:      Height:        CBC:  Recent Labs  Lab 10/01/21 1130 10/01/21 1148 10/04/21 0348 10/05/21 0428  WBC 7.9   < > 8.1 9.4  NEUTROABS 6.2  --   --   --   HGB 15.2*   < > 13.1 14.0  HCT 44.3   < > 38.2 41.4  MCV 95.7   < > 97.0 96.3  PLT 246   < > 219 236   < > = values in this interval not displayed.    Basic Metabolic Panel:  Recent Labs  Lab 10/04/21 0348 10/05/21 0428  NA 135 136  K 3.8 3.8  CL 105 105  CO2 23 23  GLUCOSE 111* 107*  BUN 20 27*  CREATININE 0.90 0.87  CALCIUM 8.8* 9.2    Lipid Panel:  Recent Labs  Lab 10/02/21 0401  CHOL 317*  TRIG 182*  HDL 49  CHOLHDL 6.5  VLDL 36  LDLCALC 232*   HgbA1c:  Lab Results  Component Value Date   HGBA1C 5.0 10/02/2021   Urine Drug Screen:     Component Value Date/Time   LABOPIA NONE DETECTED 10/01/2021 1723   COCAINSCRNUR NONE DETECTED 10/01/2021 1723   LABBENZ NONE DETECTED 10/01/2021 1723   AMPHETMU NONE DETECTED 10/01/2021 1723   THCU NONE DETECTED 10/01/2021 1723   LABBARB NONE DETECTED 10/01/2021 1723    Alcohol Level     Component Value Date/Time   ETH <10 10/01/2021 1131    IMAGING  Results for orders placed or performed during the hospital encounter of 10/01/21  MR BRAIN WO CONTRAST   Narrative   CLINICAL DATA:  Stroke, follow-up  EXAM: MRI HEAD WITHOUT  CONTRAST  MRA HEAD WITHOUT CONTRAST  TECHNIQUE: Multiplanar, multi-echo pulse sequences of the brain and surrounding structures were acquired without intravenous contrast. Angiographic images of the Circle of Willis were acquired using MRA technique without intravenous contrast.  COMPARISON:  MRI 10/01/2021, correlation is also made with CT head 10/01/2021 11:59 a.m. and 5:01 p.m.  FINDINGS: MRI HEAD FINDINGS  Evaluation is somewhat limited by motion artifact.  Brain: Redemonstrated areas of restricted diffusion in the medial left frontal and parietal lobes, in both the left ACA territory and watershed territory. Additional small area of restricted diffusion in the left inferior cerebellum (series 3, image 12), unchanged. The majority of the infarcts were seen on the prior exam, with the exception of a small area of restricted diffusion in the parafalcine right parietal lobe (series 3, image 37). Many of these areas are not associated with increased T2 signal, and there is susceptibility in the area of the left parafalcine frontal lobe (series 7, image 17), likely small amount of associated hemorrhage. Additional susceptibility is noted  in the area of the left A2, corresponding to a stent.  No mass, mass, or midline shift.  Vascular: Please see MRA findings below.  Skull and upper cervical spine: Normal marrow signal.  Sinuses/Orbits: Mucosal thickening in the left frontal sinus. The orbits are unremarkable.  Other: Fluid in left mastoid air cells.  MRA HEAD FINDINGS  Evaluation is somewhat limited by motion artifact.  Anterior circulation: Both internal carotid arteries are patent to the termini, without stenosis or other abnormality. A1 segments patent. Normal anterior communicating artery. Area of susceptibility in the course of the left A2, secondary to stent placement. Anterior cerebral arteries are otherwise patent to their distal aspects, although evaluation  is limited by both motion and susceptibility. No M1 stenosis or occlusion; irregularity in the left M1 is felt to be secondary to motion. Evaluation of the bifurcations and distal MCA branches is limited by motion, however these grossly appear patent.  Posterior circulation: Diminutive right vertebral artery that terminates primarily in PICA. Patent left vertebral artery. The basilar artery is patent. The superior cerebellar arteries are unremarkable. Fetal origin of the right PCA, with a patent posterior communicating artery and no definite visualization of a right P1. Possible narrowing of the proximal right P2, although evaluation is limited by motion. The left PCA appears patent. The left posterior communicating artery is not definitively visualized.  Anatomic variants: None significant  IMPRESSION: 1. Redemonstrated infarcts in the left ACA and ACA/PCA watershed territory, with one new area of restricted diffusion in the medial right parietal lobe, likely an additional acute infarct, status post A2 stenting. A small amount of associated petechial hemorrhage is noted within the medial left frontal lobe, without significant hemorrhagic transformation. 2. Evaluation of the vasculature is limited by motion. Within this limitation, there is no large vessel occlusion or significant stenosis. Evaluation of the left A2 is limited by both motion and susceptibility artifact from the stent. If further evaluation of the vasculature is indicated, consider CTA.   Electronically Signed   By: Merilyn Baba M.D.   On: 10/02/2021 14:04   MR ANGIO HEAD WO CONTRAST   Narrative   CLINICAL DATA:  Stroke, follow-up  EXAM: MRI HEAD WITHOUT CONTRAST  MRA HEAD WITHOUT CONTRAST  TECHNIQUE: Multiplanar, multi-echo pulse sequences of the brain and surrounding structures were acquired without intravenous contrast. Angiographic images of the Circle of Willis were acquired using MRA  technique without intravenous contrast.  COMPARISON:  MRI 10/01/2021, correlation is also made with CT head 10/01/2021 11:59 a.m. and 5:01 p.m.  FINDINGS: MRI HEAD FINDINGS  Evaluation is somewhat limited by motion artifact.  Brain: Redemonstrated areas of restricted diffusion in the medial left frontal and parietal lobes, in both the left ACA territory and watershed territory. Additional small area of restricted diffusion in the left inferior cerebellum (series 3, image 12), unchanged. The majority of the infarcts were seen on the prior exam, with the exception of a small area of restricted diffusion in the parafalcine right parietal lobe (series 3, image 37). Many of these areas are not associated with increased T2 signal, and there is susceptibility in the area of the left parafalcine frontal lobe (series 7, image 17), likely small amount of associated hemorrhage. Additional susceptibility is noted in the area of the left A2, corresponding to a stent.  No mass, mass, or midline shift.  Vascular: Please see MRA findings below.  Skull and upper cervical spine: Normal marrow signal.  Sinuses/Orbits: Mucosal thickening in the left frontal sinus. The  orbits are unremarkable.  Other: Fluid in left mastoid air cells.  MRA HEAD FINDINGS  Evaluation is somewhat limited by motion artifact.  Anterior circulation: Both internal carotid arteries are patent to the termini, without stenosis or other abnormality. A1 segments patent. Normal anterior communicating artery. Area of susceptibility in the course of the left A2, secondary to stent placement. Anterior cerebral arteries are otherwise patent to their distal aspects, although evaluation is limited by both motion and susceptibility. No M1 stenosis or occlusion; irregularity in the left M1 is felt to be secondary to motion. Evaluation of the bifurcations and distal MCA branches is limited by motion, however these grossly appear  patent.  Posterior circulation: Diminutive right vertebral artery that terminates primarily in PICA. Patent left vertebral artery. The basilar artery is patent. The superior cerebellar arteries are unremarkable. Fetal origin of the right PCA, with a patent posterior communicating artery and no definite visualization of a right P1. Possible narrowing of the proximal right P2, although evaluation is limited by motion. The left PCA appears patent. The left posterior communicating artery is not definitively visualized.  Anatomic variants: None significant  IMPRESSION: 1. Redemonstrated infarcts in the left ACA and ACA/PCA watershed territory, with one new area of restricted diffusion in the medial right parietal lobe, likely an additional acute infarct, status post A2 stenting. A small amount of associated petechial hemorrhage is noted within the medial left frontal lobe, without significant hemorrhagic transformation. 2. Evaluation of the vasculature is limited by motion. Within this limitation, there is no large vessel occlusion or significant stenosis. Evaluation of the left A2 is limited by both motion and susceptibility artifact from the stent. If further evaluation of the vasculature is indicated, consider CTA.   Electronically Signed   By: Merilyn Baba M.D.   On: 10/02/2021 14:04   CT HEAD WO CONTRAST   Narrative   CLINICAL DATA:  Stroke, follow-up status post mechanical thrombectomy and left A2 stenting  EXAM: CT HEAD WITHOUT CONTRAST  TECHNIQUE: Contiguous axial images were obtained from the base of the skull through the vertex without intravenous contrast.  COMPARISON:  10/01/2021  FINDINGS: Brain: Redemonstrated mild loss of gray-white differentiation in the medial left frontal lobe, consistent with known left ACA occlusion, although the loss of gray-white is less distinct on the current exam. No new area of infarction. No hemorrhage, mass, mass effect, or  midline shift. No hydrocephalus or extra-axial collection.  Vascular: Interval placement of left A2 stent. No hyperdense vessel. Atherosclerotic calcifications in the intracranial carotid and vertebral arteries.  Skull: Normal. Negative for fracture or focal lesion.  Sinuses/Orbits: Mucosal thickening in the left frontal sinus and left anterior ethmoid air cells. The orbits are unremarkable.  Other: Fluid in left mastoid air cells.  IMPRESSION: Status post placement of a left A2 stent, with diminished conspicuity of previously noted loss of gray-white differentiation in the medial left frontal lobe. No new area of infarction.   Electronically Signed   By: Merilyn Baba M.D.   On: 10/01/2021 18:09        PHYSICAL EXAM  General:  Patient is an alert, well-developed female in no acute distress.   NEURO:  Mental Status: AA&Ox3  Speech/Language: speech is without dysarthria or aphasia.  Naming, repetition, fluency, and comprehension intact.  Cranial Nerves:  II: PERRL. Visual fields full.  III, IV, VI: EOMI. Eyelids elevate symmetrically.  V: Sensation is intact to light touch and symmetrical to face.  VII: Smile is symmetrical. Able to puff  cheeks and raise eyebrows.  VIII: hearing intact to voice. IX, X: Phonation is normal.  XII: tongue is midline without fasciculations. Motor: 5/5 strength to all muscle groups tested. Right side is slightly weaker than left  Tone: is normal and bulk is normal Sensation- Intact to light touch bilaterally.  Coordination: FTN intact bilaterally, HKS: no ataxia in BLE.No drift. Right hand tremor present, decreased fine motor skills in right hand Gait- deferred    ASSESSMENT/PLAN [name] is a 70 y.o. female 70 year old female with history of HLD admitted for right-sided weakness, aphasia.  Continue to mobilize patient out of bed. Patient is ready for discharge to CIR when a bed is ready. 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 cardioembolic CT head left ACA infarct CTA head and neck- Left A2/ACA occulusion CTP 0 core, 27 cc penumbra Stat MRI showed left ACA infarct, however there is viable tissue compared with CTP IR - left A2 occlusion s/p TICI3 revascularization and left A2 stent MRI head 11/18 - Redemonstrated infarcts in the left ACA and ACA/PCA watershed territory, with one new area of restricted diffusion in the medial right parietal lobe, likely an additional acute infarct, status post A2 stenting.  MRA head post A2 stenting, distal A2 seems patent.  2D Echo EF 65 to 70% LDL 232 HgbA1c 5.0 Lovenox for VTE prophylaxis No antithrombotic prior to admission, now on Brilinta (ticagrelor) 90 mg bid and ASA 81mg  Ongoing aggressive stroke risk factor management Therapy recommendations:  Acute inpatient rehab-  consult placed and received Disposition:  to CIR when bed available  Hypertension Keep BP less than 180/105 Long-term BP goal normotensive 5 mg amlodipine daily PRN labetolol   Hyperlipidemia Home meds:  None  LDL 232, goal < 70 Lipitor 80mg  daily Continue statin at discharge  Tobacco abuse Current smoker Smoking cessation counseling provided Nicotine patch provided Pt is willing to quit  Other Problems B/l arm tremor - chronic  BUN 27 on AM labs- will recheck BMP tomorrow AM.   Hospital day # 4  Continue mobilize out of bed.  Ongoing therapies.  Patient medically stable to be transferred to inpatient rehab if bed available.  Long discussion with patient and husband at the bedside and answered questions.  Greater than 50% time during this timeframe with the placement of counseling coordination of care and discussion with patient and husband and care team and answering questions Antony Contras, MD To contact Stroke Continuity provider, please refer to  [email or web address] After hours, contact General Neurology

## 2021-10-05 NOTE — Progress Notes (Signed)
Inpatient Rehab Admissions Coordinator:  Began insurance authorization. Also awaiting bed availability. Will continue to follow.   Gayland Curry, [place], [place] Admissions Coordinator [phone]

## 2021-10-05 NOTE — Progress Notes (Signed)
Occupational Therapy Treatment Patient Details Name: Andrea Williamson MRN: [id number] DOB: [birth year] Today's Date: 10/05/2021   History of present illness 70 y/o female presented to ED on 11/17 after a fall with R LE weakness and aphasia. 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 S/p mechanical thrombectomy of L ACA. PMH: HLD   OT comments  Pt making good progress toward goals. Pt would benefit from being toileted q 2 hours due to new onset of urinary incontinence since CVA. Able to ambulate using +2 mod A and assist with LB ADL with Mod A. Midline postural control improving however pt continues to demonstrate a R bias. Eyes red, especially L eye with apparent discharge - nsg made aware. Excellent candidate for rehab at CIR. Will continue to follow acutely.    Recommendations for follow up therapy are one component of a multi-disciplinary discharge planning process, led by the attending physician.  Recommendations may be updated based on patient status, additional functional criteria and insurance authorization.    Follow Up Recommendations  Acute inpatient rehab (3hours/day)    Assistance Recommended at Discharge Frequent or constant Supervision/Assistance  Equipment Recommendations  BSC/3in1;Tub/shower seat    Recommendations for Other Services Rehab consult    Precautions / Restrictions Precautions Precautions: Fall;Other (comment) Precaution Comments: watch HR - periods of tachycardia Restrictions Weight Bearing Restrictions: No       Mobility Bed Mobility               General bed mobility comments: OOB in chair    Transfers Overall transfer level: Needs assistance   Transfers: Sit to/from Stand Sit to Stand: Mod assist   Step pivot transfers: Mod assist;+2 physical assistance             Balance Overall balance assessment: Needs assistance   Sitting balance-Leahy Scale: Fair   Postural  control: Right lateral lean   Standing balance-Leahy Scale: Poor                             ADL either performed or assessed with clinical judgement   ADL Overall ADL's : Needs assistance/impaired             Lower Body Bathing: Moderate assistance;Sit to/from stand   Upper Body Dressing : Minimal assistance   Lower Body Dressing: Maximal assistance   Toilet Transfer: Moderate assistance;+2 for safety/equipment Toilet Transfer Details (indicate cue type and reason): simulated Toileting- Clothing Manipulation and Hygiene: Total assistance Toileting - Clothing Manipulation Details (indicate cue type and reason): incontinenet; would benefit from toileting program            Extremity/Trunk Assessment Upper Extremity Assessment Upper Extremity Assessment: RUE deficits/detail RUE Deficits / Details: R hand dominant and requires cues to use during funcitonal activities; decreased fine motor/coordination   Lower Extremity Assessment Lower Extremity Assessment: Defer to PT evaluation        Vision   Additional Comments: will further assess; need to assess reading; note decreased visual attention; eyes red L eye has increased amounts of discharge - nsg made aware   Perception Perception Perception: Impaired   Praxis Praxis Praxis:  (poor spatial awareness) Praxis Impairment Details: Initiation (motor impersistence noted; difficulty keeping RLE/UE "turned on")    Cognition Arousal/Alertness: Awake/alert Behavior During Therapy: WFL for tasks assessed/performed Overall Cognitive Status: Impaired/Different from baseline Area of Impairment: Attention;Following commands;Safety/judgement;Problem solving;Awareness;Memory  Current Attention Level: Sustained Memory: Decreased short-term memory Following Commands: Follows one step commands with increased time;Follows multi-step commands inconsistently Safety/Judgement: Decreased awareness  of safety;Decreased awareness of deficits Awareness: Emergent Problem Solving: Difficulty sequencing;Requires verbal cues General Comments: Pt requiring max cues throughout session to correct R and posterior leaning. At times pt inappropriately laughing during session, when losing balance especially.          Exercises     Shoulder Instructions       General Comments HR ranged  - very high at times however unsure of accuracy- nsg made aware; BP 142/108    Pertinent Vitals/ Pain       Pain Assessment: No/denies pain  Home Living                                          Prior Functioning/Environment              Frequency  Min 2X/week        Progress Toward Goals  OT Goals(current goals can now be found in the care plan section)  Progress towards OT goals: Progressing toward goals  Acute Rehab OT Goals Patient Stated Goal: to get better OT Goal Formulation: With patient Time For Goal Achievement: 10/16/21 Potential to Achieve Goals: Good ADL Goals Pt Will Perform Grooming: with min guard assist;sitting Pt Will Perform Upper Body Bathing: with min guard assist Pt Will Perform Upper Body Dressing: with min assist Pt Will Transfer to Toilet: with mod assist;bedside commode;stand pivot transfer Additional ADL Goal #1: Patient will sit unsupported for up to 5 min with min VC's for midline sitting, and min guard during functional task.  Plan Discharge plan remains appropriate    Co-evaluation    PT/OT/SLP Co-Evaluation/Treatment: Yes Reason for Co-Treatment: For patient/therapist safety;To address functional/ADL transfers PT goals addressed during session: Mobility/safety with mobility;Balance OT goals addressed during session: ADL's and self-care      AM-PAC OT "6 Clicks" Daily Activity     Outcome Measure   Help from another person eating meals?: A Little Help from another person taking care of personal grooming?: A Little Help from  another person toileting, which includes using toliet, bedpan, or urinal?: Total Help from another person bathing (including washing, rinsing, drying)?: A Lot Help from another person to put on and taking off regular upper body clothing?: A Little Help from another person to put on and taking off regular lower body clothing?: A Lot 6 Click Score: 14    End of Session Equipment Utilized During Treatment: Gait belt  OT Visit Diagnosis: Unsteadiness on feet (R26.81);Other abnormalities of gait and mobility (R26.89);Muscle weakness (generalized) (M62.81);Other symptoms and signs involving cognitive function;Hemiplegia and hemiparesis;Apraxia (R48.2) Hemiplegia - Right/Left: Right Hemiplegia - dominant/non-dominant: Dominant Hemiplegia - caused by: Cerebral infarction   Activity Tolerance Patient tolerated treatment well   Patient Left in chair;with call bell/phone within reach;with chair alarm set   Nurse Communication Mobility status;Other (comment) (L eye red and draining)        Time: 3474-2595 OT Time Calculation (min): 31 min  Charges: OT General Charges $OT Visit: 1 Visit OT Treatments $Self Care/Home Management : 8-22 mins  Maurie Boettcher, OT/L   Acute OT Clinical Specialist [place] Pager [phone] Office [phone]   [facility] 10/05/2021, 2:14 PM

## 2021-10-05 NOTE — Care Management Important Message (Signed)
Important Message  Patient Details  Name: SOLIMAR MAIDEN MRN: [id number] Date of Birth: 16-Jul-1951   Medicare Important Message Given:  Yes     Joetta Manners 10/05/2021, 12:50 PM

## 2021-10-05 NOTE — Progress Notes (Signed)
5:13 on 10/05/21 Call from telemetry notifying us that patient had a 10 sec run of trigeminal PVC's, stripped saved.

## 2021-10-05 NOTE — PMR Pre-admission (Signed)
PMR Admission Coordinator Pre-Admission Assessment  Patient: Andrea Williamson is an 70 y.o., female MRN: [id number] DOB: [birth year] Height: '5\' 3"'  (160 cm) Weight: 73.3 kg  Insurance Information HMO: yes    PPO:      PCP:      IPA:      80/20:      OTHER:  PRIMARY: [employer] Medicare      Policy#: [id number]      Subscriber: patient CM Name: N/a      Phone#: [phone]     Fax#: [phone] I received a call from [place] at [facility] granting approval on 10/06/21 for admission for 7 days with updates due 81/15/72 Pre-Cert#: [id number]      Employer:  Benefits:  Phone #: [email or web address]     Name:  Eff. Date: 11/15/20-11/14/21     Deduct: does not have deductible      Out of Pocket Max: $4,500 ($0 met)      Life Max: NA CIR: $325/day co-pay for days 1-5, $0/day co-pay for days 6+      SNF: $0/day co-pay for days 1-20, $188/day co-pay for days 21-44, $0/day co-pay for days 45-100 Outpatient: $30/visit co-pay     Co-Pay:  Home Health: 100%       Co-Pay:  DME: 80%     Co-Pay: 20% co-insurance Providers: in-network SECONDARY:       Policy#:      Phone#:   Development worker, community:       Phone#:   [employer] Engineer, [employer]" for patients in Inpatient Rehabilitation Facilities with attached "Privacy Act [place] Records" was provided and verbally reviewed with: Patient  Emergency Contact Information Contact Information     Name Relation Home Work [place] Son   [phone]   Gumbs,[name] Daughter   [phone]       Current Medical History  Patient Admitting Diagnosis: L CVA History of Present Illness: Pt is a 70 year old female with medical hx significant for: HLD. Pt presented to hospital on 10/01/21 with right leg weakness and aphasia. On 11/16, pt fell while bowling but did not hit her head. Husband found her on the floor day of hospital presentation. Code Stroke initiated in ED. Pt was waxing/waning in ED and a large penumbra was noted on CTP,  therefore it was decided to take pt for thrombectomy. CT head showed left ACA infarct. MRI confirmed findings. Therapy evaluations completed and CIR recommended d/t pt's deficits in functional mobility and inability to complete ADLs independently.  Complete NIHSS TOTAL: 1  Patient's medical record from [facility] has been reviewed by the rehabilitation admission coordinator and physician.  Past Medical History  No past medical history on file.  Has the patient had major surgery during 100 days prior to admission? Yes  Family History   family history is not on file.  Current Medications  Current Facility-Administered Medications:     stroke: mapping our early stages of recovery book, , Does not apply, Once, Greta Doom, MD   acetaminophen (TYLENOL) tablet 650 mg, 650 mg, Oral, Q4H PRN **OR** acetaminophen (TYLENOL) 160 MG/5ML solution 650 mg, 650 mg, Per Tube, Q4H PRN **OR** acetaminophen (TYLENOL) suppository 650 mg, 650 mg, Rectal, Q4H PRN, Greta Doom, MD   amLODipine (NORVASC) tablet 5 mg, 5 mg, Oral, Daily, Leonie Man, Pramod S, MD, 5 mg at 10/07/21 0037   aspirin chewable tablet 81 mg, 81 mg, Oral, Daily, 81 mg at 10/07/21 0927 **OR** aspirin chewable tablet  81 mg, 81 mg, Per Tube, Daily, de Sindy Messing, Wheeler, MD, 81 mg at 10/05/21 0913   atorvastatin (LIPITOR) tablet 80 mg, 80 mg, Oral, Daily, Rosalin Hawking, MD, 80 mg at 10/07/21 0926   bisacodyl (DULCOLAX) suppository 10 mg, 10 mg, Rectal, Daily PRN, Bailey-Modzik, Delila A, NP   enoxaparin (LOVENOX) injection 40 mg, 40 mg, Subcutaneous, Q24H, Rosalin Hawking, MD, 40 mg at 10/06/21 2108   iohexol (OMNIPAQUE) 300 MG/ML solution 100 mL, 100 mL, Intra-arterial, Once PRN, de Sindy Messing, [place], MD   nicotine (NICODERM CQ - dosed in mg/24 hours) patch 21 mg, 21 mg, Transdermal, Daily, Rosalin Hawking, MD, 21 mg at 10/07/21 0927   ondansetron (ZOFRAN) injection 4 mg, 4 mg, Intravenous, Q6H PRN, de Sindy Messing, [place], MD   senna-docusate (Senokot-S) tablet 1 tablet, 1 tablet, Oral, Q0600, Garvin Fila, MD   ticagrelor [facility]) tablet 90 mg, 90 mg, Oral, BID, 90 mg at 10/07/21 7035 **OR** ticagrelor (BRILINTA) tablet 90 mg, 90 mg, Per Tube, BID, Greta Doom, MD  Patients Current Diet:  Diet Order             Diet Heart Room service appropriate? Yes with Assist; Fluid consistency: Thin  Diet effective now                   Precautions / Restrictions Precautions Precautions: Fall, Other (comment) Precaution Comments: watch HR - periods of tachycardia Restrictions Weight Bearing Restrictions: No   Has the patient had 2 or more falls or a fall with injury in the past year? Yes  Prior Activity Level Limited Community (1-2x/wk): drives, gets out of house a coupe times a week  Prior Functional Level Self Care: Did the patient need help bathing, dressing, using the toilet or eating? Independent  Indoor Mobility: Did the patient need assistance with walking from room to room (with or without device)? Independent  Stairs: Did the patient need assistance with internal or external stairs (with or without device)? Independent  Functional Cognition: Did the patient need help planning regular tasks such as shopping or remembering to take medications? Independent  Patient Information Are you of Hispanic, Latino/a,or Spanish origin?: A. No, not of Hispanic, Latino/a, or Spanish origin What is your race?: A. White Do you need or want an interpreter to communicate with a doctor or health care staff?: 0. No  Patient's Response To:  Health Literacy and Transportation Is the patient able to respond to health literacy and transportation needs?: Yes Health Literacy - How often do you need to have someone help you when you read instructions, pamphlets, or other written material from your doctor or pharmacy?: Never In the past 12 months, has lack of transportation kept you  from medical appointments or from getting medications?: No In the past 12 months, has lack of transportation kept you from meetings, work, or from getting things needed for daily living?: No  Home Assistive Devices / Equipment Home Equipment: None  Prior Device Use: Indicate devices/aids used by the patient prior to current illness, exacerbation or injury? None of the above  Current Functional Level Cognition  Arousal/Alertness: Awake/alert Overall Cognitive Status: Impaired/Different from baseline Current Attention Level: Sustained Orientation Level: Oriented X4 Following Commands: Follows one step commands with increased time, Follows multi-step commands inconsistently Safety/Judgement: Decreased awareness of safety, Decreased awareness of deficits General Comments: continued max cues for safety during mobility, especially R lateral leaning and sequencing gait Attention: Sustained Sustained Attention: Appears intact Memory: Impaired Memory Impairment:  Retrieval deficit, Storage deficit Awareness: Impaired Awareness Impairment: Anticipatory impairment Problem Solving:  (will assess for higher level) Safety/Judgment: Impaired    Extremity Assessment (includes Sensation/Coordination)  Upper Extremity Assessment: RUE deficits/detail RUE Deficits / Details: R hand dominant and requires cues to use during funcitonal activities; decreased fine motor/coordination RUE Sensation: WNL RUE Coordination: WNL  Lower Extremity Assessment: Defer to PT evaluation RLE Deficits / Details: Hip flexors 1/5 but otherwise 0/5 RLE Sensation: decreased light touch RLE Coordination: decreased fine motor, decreased gross motor    ADLs  Overall ADL's : Needs assistance/impaired Grooming: Wash/dry hands, Wash/dry face, Minimal assistance, Bed level Upper Body Bathing: Moderate assistance, Bed level Lower Body Bathing: Moderate assistance, Sit to/from stand Upper Body Dressing : Minimal  assistance Lower Body Dressing: Maximal assistance Toilet Transfer: Moderate assistance, +2 for safety/equipment Toilet Transfer Details (indicate cue type and reason): simulated Toileting- Clothing Manipulation and Hygiene: Total assistance Toileting - Clothing Manipulation Details (indicate cue type and reason): incontinenet; would benefit from toileting program Functional mobility during ADLs: Moderate assistance, +2 for physical assistance General ADL Comments: no acitive contraction noted to R LE, difficulty maintaining midline stting - R lean bias with cues to correct R lean, using L arm to hold onto armrest of the recliner    Mobility  Overal bed mobility: Needs Assistance Bed Mobility: Supine to Sit, Sit to Supine Supine to sit: Min assist Sit to supine: Min assist General bed mobility comments: assist for trunk and LE management, scooting to EOB via HHA. Increased time, requires sequencing cues.    Transfers  Overall transfer level: Needs assistance Equipment used: 2 person hand held assist Transfers: Sit to/from Stand Sit to Stand: Mod assist, +2 safety/equipment Bed to/from chair/wheelchair/BSC transfer type:: Step pivot Step pivot transfers: Mod assist, +2 physical assistance General transfer comment: mod assist for power up, rise, steadying, correcting R lateral bias. STS x2, from EOB and toilet.    Ambulation / Gait / Stairs / Wheelchair Mobility  Ambulation/Gait Ambulation/Gait assistance: Mod assist, +2 physical assistance Gait Distance (Feet): 15 Feet (x2 - to and from bathroom) Assistive device: 2 person hand held assist Gait Pattern/deviations: Step-through pattern, Decreased stride length, Trunk flexed, Drifts right/left, Decreased step length - right General Gait Details: Pt with RUE draped over PT shoulders, PT tech holding pt's L hand. Assist to steady, correct balance, progress RLE, step-by-step cuing for sequencing gait and progressing RLE. Gait velocity:  decr Pre-gait activities: forward and backward stepping RLE x5, standing marches x2 bilat    Posture / Balance Dynamic Sitting Balance Sitting balance - Comments: Initial right lateral lean but able to correct with min verbal cues Balance Overall balance assessment: Needs assistance Sitting-balance support: Single extremity supported, Feet supported Sitting balance-Leahy Scale: Fair Sitting balance - Comments: Initial right lateral lean but able to correct with min verbal cues Postural control: Right lateral lean, Posterior lean Standing balance support: Bilateral upper extremity supported, During functional activity Standing balance-Leahy Scale: Poor Standing balance comment: reliant on external assist    Special needs/care consideration Skin Catheter entry/exit: groin/right, External urinary catheter, Bladder incontinence   Previous Home Environment (from acute therapy documentation) Living Arrangements: Spouse/significant other  Lives With: Spouse Available Help at Discharge: Family, Available 24 hours/day Type of Home: House Home Layout: One level Home Access: Stairs to enter Entrance Stairs-Rails: Can reach both Entrance Stairs-Number of Steps: 2 Bathroom Shower/Tub: Chiropodist: Standard Bathroom Accessibility: Yes How Accessible: Accessible via walker Home Care Services: No  Discharge Living  Setting Plans for Discharge Living Setting: House (son's house) Type of Home at Discharge: House Discharge Home Layout: One level Discharge Home Access: Stairs to enter Entrance Stairs-Rails: Can reach both Entrance Stairs-Number of Steps: 1 Discharge Bathroom Shower/Tub: Walk-in shower Discharge Bathroom Toilet: Standard Discharge Bathroom Accessibility: Yes How Accessible: Accessible via walker Does the patient have any problems obtaining your medications?: No  Social/Family/Support Systems Anticipated Caregiver: Kandis Nab, son and  daughter-in-law Anticipated Caregiver's Contact Information: [phone] Caregiver Availability: 24/7 Discharge Plan Discussed with Primary Caregiver: Yes Is Caregiver In Agreement with Plan?: Yes Does Caregiver/Family have Issues with Lodging/Transportation while Pt is in Rehab?: No  Goals Patient/Family Goal for Rehab: Mod I-Supervision: PT/OT Expected length of stay: 16-18 days Pt/Family Agrees to Admission and willing to participate: Yes Program Orientation Provided & Reviewed with Pt/Caregiver Including Roles  & Responsibilities: Yes  Decrease burden of Care through IP rehab admission: NA  Possible need for SNF placement upon discharge: Not anticipated  Patient Condition: [facility], spoken with CM, and patient and son. I met with patient at the bedside and discussed via phone for inpatient rehabilitation assessment.  Patient will benefit from ongoing PT, OT, and SLP, can actively participate in 3 hours of therapy a day 5 days of the week, and can make measurable gains during the admission.  Patient will also benefit from the coordinated team approach during an Inpatient Acute Rehabilitation admission.  The patient will receive intensive therapy as well as Rehabilitation physician, nursing, social worker, and care management interventions.  Due to bladder management, safety, skin/wound care, disease management, medication administration, pain management, and patient education the patient requires 24 hour a day rehabilitation nursing.  The patient is currently mod A+2 with mobility and basic ADLs.  Discharge setting and therapy post discharge at home with home health is anticipated.  Patient has agreed to participate in the Acute Inpatient Rehabilitation Program and will admit today.  Preadmission Screen Completed By:  Bethel Born, 10/07/2021 11:22 AM  with updates by Clemens Catholic, MS,  CCC-SLP  ______________________________________________________________________   Discussed status with Dr. Letta Pate  on 10/07/21 at 1000 and received approval for admission today.  Admission Coordinator:  Bethel Born, CCC-SLP, time 1120/Date 10/07/21   Assessment/Plan: Diagnosis:Left ACA infarct Does the need for close, 24 hr/day Medical supervision in concert with the patient's rehab needs make it unreasonable for this patient to be served in a less intensive setting? 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 Yes Does the patient require coordinated care of a physician, rehab nurse, PT, OT, and SLP to address physical and functional deficits in the context of the above medical diagnosis(es)? Yes Addressing deficits in the following areas: balance, endurance, locomotion, strength, transferring, bowel/bladder control, bathing, dressing, feeding, grooming, toileting, cognition, language, and psychosocial support Can the patient actively participate in an intensive therapy program of at least 3 hrs of therapy 5 days a week? Yes The potential for patient to make measurable gains while on inpatient rehab is good Anticipated functional outcomes upon discharge from inpatient rehab: supervision PT, supervision OT, supervision SLP Estimated rehab length of stay to reach the above functional goals is: 16-18d Anticipated discharge destination: Home 10. Overall Rehab/Functional Prognosis: good   MD Signature: Charlett Blake M.D. [place]  Fellow [employer] Fellow [employer] Board of Interventional Pain

## 2021-10-06 DIAGNOSIS — I6300 Cerebral infarction due to thrombosis of unspecified precerebral artery: Secondary | ICD-10-CM | POA: Diagnosis not present

## 2021-10-06 LAB — BASIC METABOLIC PANEL
Anion gap: 8 (ref 5–15)
BUN: 28 mg/dL — ABNORMAL HIGH (ref 8–23)
CO2: 23 mmol/L (ref 22–32)
Calcium: 9.5 mg/dL (ref 8.9–10.3)
Chloride: 106 mmol/L (ref 98–111)
Creatinine, Ser: 0.82 mg/dL (ref 0.44–1.00)
GFR, Estimated: >60 mL/min (ref >60)
Glucose, Bld: 116 mg/dL — ABNORMAL HIGH (ref 70–99)
Potassium: 3.7 mmol/L (ref 3.5–5.1)
Sodium: 137 mmol/L (ref 135–145)

## 2021-10-06 LAB — CBC
HCT: 39.9 % (ref 36.0–46.0)
Hemoglobin: 13.9 g/dL (ref 12.0–15.0)
MCH: 33.1 pg (ref 26.0–34.0)
MCHC: 34.8 g/dL (ref 30.0–36.0)
MCV: 95 fL (ref 80.0–100.0)
Platelets: 241 10*3/uL (ref 150–400)
RBC: 4.2 MIL/uL (ref 3.87–5.11)
RDW: 12.7 % (ref 11.5–15.5)
WBC: 8.3 10*3/uL (ref 4.0–10.5)
nRBC: 0 % (ref 0.0–0.2)

## 2021-10-06 NOTE — Progress Notes (Signed)
SLP Cancellation Note  Patient Details Name: Andrea Williamson MRN: [id number] DOB: 06/19/1951   Cancelled treatment:        Family present and on planned phone call re: insurance when attempted. Will continue efforts.    [place] Siren 10/06/2021, 2:37 PM

## 2021-10-06 NOTE — Progress Notes (Signed)
Physical Therapy Treatment Patient Details Name: Andrea Williamson MRN: [id number] DOB: 04-Feb-1951 Today's Date: 10/06/2021   History of Present Illness 70 y/o female presented to ED on 11/17 after a fall with R LE weakness and aphasia. 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 S/p mechanical thrombectomy of L ACA. PMH: HLD.    PT Comments    Pt with improved overall tolerance for gait today, ambulatory to and from bathroom with mod +2 assist. Pt requires significant cuing and RLE facilitation for gait at this time. Pt more aware of R lateral leaning today, requires much less cuing to correct today vs yesterday.  PT to continue to follow, acute inpatient rehab remains appropriate.    Recommendations for follow up therapy are one component of a multi-disciplinary discharge planning process, led by the attending physician.  Recommendations may be updated based on patient status, additional functional criteria and insurance authorization.  Follow Up Recommendations  Acute inpatient rehab (3hours/day)     Assistance Recommended at Discharge Frequent or constant Supervision/Assistance  Equipment Recommendations  Rolling walker (2 wheels);BSC/3in1;Wheelchair (measurements PT);Wheelchair cushion (measurements PT)    Recommendations for Other Services       Precautions / Restrictions Precautions Precautions: Fall;Other (comment) Precaution Comments: watch HR - periods of tachycardia Restrictions Weight Bearing Restrictions: No     Mobility  Bed Mobility Overal bed mobility: Needs Assistance Bed Mobility: Supine to Sit;Sit to Supine     Supine to sit: Min assist Sit to supine: Min assist   General bed mobility comments: assist for trunk and LE management, scooting to EOB via HHA. Increased time, requires sequencing cues.    Transfers Overall transfer level: Needs assistance Equipment used: 2 person hand held  assist Transfers: Sit to/from Stand Sit to Stand: Mod assist;+2 safety/equipment           General transfer comment: mod assist for power up, rise, steadying, correcting R lateral bias. STS x2, from EOB and toilet.    Ambulation/Gait Ambulation/Gait assistance: Mod assist;+2 physical assistance Gait Distance (Feet): 15 Feet (x2 - to and from bathroom) Assistive device: 2 person hand held assist Gait Pattern/deviations: Step-through pattern;Decreased stride length;Trunk flexed;Drifts right/left;Decreased step length - right Gait velocity: decr     General Gait Details: Pt with RUE draped over PT shoulders, PT tech holding pt's L hand. Assist to steady, correct balance, progress RLE, step-by-step cuing for sequencing gait and progressing RLE.   Stairs             Wheelchair Mobility    Modified Rankin (Stroke Patients Only) Modified Rankin (Stroke Patients Only) Pre-Morbid Rankin Score: No symptoms Modified Rankin: Moderately severe disability     Balance Overall balance assessment: Needs assistance   Sitting balance-Leahy Scale: Fair   Postural control: Right lateral lean;Posterior lean Standing balance support: Bilateral upper extremity supported;During functional activity Standing balance-Leahy Scale: Poor Standing balance comment: reliant on external assist                            Cognition Arousal/Alertness: Awake/alert Behavior During Therapy: WFL for tasks assessed/performed Overall Cognitive Status: Impaired/Different from baseline Area of Impairment: Attention;Following commands;Safety/judgement;Problem solving;Awareness;Memory                   Current Attention Level: Sustained Memory: Decreased short-term memory Following Commands: Follows one step commands with increased time;Follows multi-step commands inconsistently Safety/Judgement: Decreased awareness of  safety;Decreased awareness of deficits Awareness: Emergent Problem  Solving: Difficulty sequencing;Requires verbal cues General Comments: continued max cues for safety during mobility, especially R lateral leaning and sequencing gait        Exercises      General Comments        Pertinent Vitals/Pain Pain Assessment: Faces Faces Pain Scale: Hurts little more Pain Location: abdomen, from need to have BM Pain Descriptors / Indicators: Discomfort;Grimacing Pain Intervention(s): Limited activity within patient's tolerance;Monitored during session;Other (comment) (RN notified of possible stool impaction)    Home Living                          Prior Function            PT Goals (current goals can now be found in the care plan section) Acute Rehab PT Goals Patient Stated Goal: to walk PT Goal Formulation: With patient Time For Goal Achievement: 10/16/21 Potential to Achieve Goals: Good Progress towards PT goals: Progressing toward goals    Frequency    Min 4X/week      PT Plan Current plan remains appropriate    Co-evaluation              AM-PAC PT "6 Clicks" Mobility   Outcome Measure  Help needed turning from your back to your side while in a flat bed without using bedrails?: A Little Help needed moving from lying on your back to sitting on the side of a flat bed without using bedrails?: A Little Help needed moving to and from a bed to a chair (including a wheelchair)?: A Lot Help needed standing up from a chair using your arms (e.g., wheelchair or bedside chair)?: A Lot Help needed to walk in hospital room?: A Lot Help needed climbing 3-5 steps with a railing? : Total 6 Click Score: 13    End of Session Equipment Utilized During Treatment: Gait belt Activity Tolerance: Patient tolerated treatment well Patient left: with call bell/phone within reach;in bed;with bed alarm set Nurse Communication: Mobility status;Other (comment) (suspected fecal impaction) PT Visit Diagnosis: Unsteadiness on feet (R26.81);Other  abnormalities of gait and mobility (R26.89);Muscle weakness (generalized) (M62.81);History of falling (Z91.81);Difficulty in walking, not elsewhere classified (R26.2);Other symptoms and signs involving the nervous system (R29.898)     Time: 1435-1455 PT Time Calculation (min) (ACUTE ONLY): 20 min  Charges:  $Gait Training: 8-22 mins                     Stacie Glaze, PT DPT Acute Rehabilitation Services Pager [phone]  Office [phone]    [place] 10/06/2021, 5:29 PM

## 2021-10-06 NOTE — Progress Notes (Signed)
Inpatient Rehab Admissions Coordinator:   I do not yet have insurance auth or a bed available on CIR today. I will continue to follow for potential admit pending insurance auth and bed availability.  Clemens Catholic, [place], [place] Admissions Coordinator  [phone] ([place]) [phone] (office)

## 2021-10-06 NOTE — Progress Notes (Addendum)
STROKE TEAM PROGRESS NOTE   SUBJECTIVE (INTERVAL HISTORY) Patient is seen in her room with two family members at the bedside.  She has had no acute events overnight and remains hemodynamically stable.  She is awaiting discharge to CIR, and neurological exam continues to show improving right sided weakness.  Vital signs stable. OBJECTIVE Vitals:   10/06/21 0309 10/06/21 0342 10/06/21 0700 10/06/21 1141  BP: (!) 177/90 (!) 149/78 (!) 140/92 129/66  Pulse: (!) 58 60 64 62  Resp: 18 11  20   Temp: 98.2 F (36.8 C)  99.4 F (37.4 C) 98.6 F (37 C)  TempSrc: Oral  Oral Oral  SpO2: 97% 97%  93%  Weight:      Height:        CBC:  Recent Labs  Lab 10/01/21 1130 10/01/21 1148 10/05/21 0428 10/06/21 0334  WBC 7.9   < > 9.4 8.3  NEUTROABS 6.2  --   --   --   HGB 15.2*   < > 14.0 13.9  HCT 44.3   < > 41.4 39.9  MCV 95.7   < > 96.3 95.0  PLT 246   < > 236 241   < > = values in this interval not displayed.     Basic Metabolic Panel:  Recent Labs  Lab 10/05/21 0428 10/06/21 0334  NA 136 137  K 3.8 3.7  CL 105 106  CO2 23 23  GLUCOSE 107* 116*  BUN 27* 28*  CREATININE 0.87 0.82  CALCIUM 9.2 9.5     Lipid Panel:  Recent Labs  Lab 10/02/21 0401  CHOL 317*  TRIG 182*  HDL 49  CHOLHDL 6.5  VLDL 36  LDLCALC 232*    HgbA1c:  Lab Results  Component Value Date   HGBA1C 5.0 10/02/2021   Urine Drug Screen:     Component Value Date/Time   LABOPIA NONE DETECTED 10/01/2021 1723   COCAINSCRNUR NONE DETECTED 10/01/2021 1723   LABBENZ NONE DETECTED 10/01/2021 1723   AMPHETMU NONE DETECTED 10/01/2021 1723   THCU NONE DETECTED 10/01/2021 1723   LABBARB NONE DETECTED 10/01/2021 1723    Alcohol Level     Component Value Date/Time   ETH <10 10/01/2021 1131    IMAGING  Results for orders placed or performed during the hospital encounter of 10/01/21  MR BRAIN WO CONTRAST   Narrative   CLINICAL DATA:  Stroke, follow-up  EXAM: MRI HEAD WITHOUT CONTRAST  MRA HEAD  WITHOUT CONTRAST  TECHNIQUE: Multiplanar, multi-echo pulse sequences of the brain and surrounding structures were acquired without intravenous contrast. Angiographic images of the Circle of Willis were acquired using MRA technique without intravenous contrast.  COMPARISON:  MRI 10/01/2021, correlation is also made with CT head 10/01/2021 11:59 a.m. and 5:01 p.m.  FINDINGS: MRI HEAD FINDINGS  Evaluation is somewhat limited by motion artifact.  Brain: Redemonstrated areas of restricted diffusion in the medial left frontal and parietal lobes, in both the left ACA territory and watershed territory. Additional small area of restricted diffusion in the left inferior cerebellum (series 3, image 12), unchanged. The majority of the infarcts were seen on the prior exam, with the exception of a small area of restricted diffusion in the parafalcine right parietal lobe (series 3, image 37). Many of these areas are not associated with increased T2 signal, and there is susceptibility in the area of the left parafalcine frontal lobe (series 7, image 17), likely small amount of associated hemorrhage. Additional susceptibility is noted in the area of  the left A2, corresponding to a stent.  No mass, mass, or midline shift.  Vascular: Please see MRA findings below.  Skull and upper cervical spine: Normal marrow signal.  Sinuses/Orbits: Mucosal thickening in the left frontal sinus. The orbits are unremarkable.  Other: Fluid in left mastoid air cells.  MRA HEAD FINDINGS  Evaluation is somewhat limited by motion artifact.  Anterior circulation: Both internal carotid arteries are patent to the termini, without stenosis or other abnormality. A1 segments patent. Normal anterior communicating artery. Area of susceptibility in the course of the left A2, secondary to stent placement. Anterior cerebral arteries are otherwise patent to their distal aspects, although evaluation is limited by both  motion and susceptibility. No M1 stenosis or occlusion; irregularity in the left M1 is felt to be secondary to motion. Evaluation of the bifurcations and distal MCA branches is limited by motion, however these grossly appear patent.  Posterior circulation: Diminutive right vertebral artery that terminates primarily in PICA. Patent left vertebral artery. The basilar artery is patent. The superior cerebellar arteries are unremarkable. Fetal origin of the right PCA, with a patent posterior communicating artery and no definite visualization of a right P1. Possible narrowing of the proximal right P2, although evaluation is limited by motion. The left PCA appears patent. The left posterior communicating artery is not definitively visualized.  Anatomic variants: None significant  IMPRESSION: 1. Redemonstrated infarcts in the left ACA and ACA/PCA watershed territory, with one new area of restricted diffusion in the medial right parietal lobe, likely an additional acute infarct, status post A2 stenting. A small amount of associated petechial hemorrhage is noted within the medial left frontal lobe, without significant hemorrhagic transformation. 2. Evaluation of the vasculature is limited by motion. Within this limitation, there is no large vessel occlusion or significant stenosis. Evaluation of the left A2 is limited by both motion and susceptibility artifact from the stent. If further evaluation of the vasculature is indicated, consider CTA.   Electronically Signed   By: Merilyn Baba M.D.   On: 10/02/2021 14:04   MR ANGIO HEAD WO CONTRAST   Narrative   CLINICAL DATA:  Stroke, follow-up  EXAM: MRI HEAD WITHOUT CONTRAST  MRA HEAD WITHOUT CONTRAST  TECHNIQUE: Multiplanar, multi-echo pulse sequences of the brain and surrounding structures were acquired without intravenous contrast. Angiographic images of the Circle of Willis were acquired using MRA technique without intravenous  contrast.  COMPARISON:  MRI 10/01/2021, correlation is also made with CT head 10/01/2021 11:59 a.m. and 5:01 p.m.  FINDINGS: MRI HEAD FINDINGS  Evaluation is somewhat limited by motion artifact.  Brain: Redemonstrated areas of restricted diffusion in the medial left frontal and parietal lobes, in both the left ACA territory and watershed territory. Additional small area of restricted diffusion in the left inferior cerebellum (series 3, image 12), unchanged. The majority of the infarcts were seen on the prior exam, with the exception of a small area of restricted diffusion in the parafalcine right parietal lobe (series 3, image 37). Many of these areas are not associated with increased T2 signal, and there is susceptibility in the area of the left parafalcine frontal lobe (series 7, image 17), likely small amount of associated hemorrhage. Additional susceptibility is noted in the area of the left A2, corresponding to a stent.  No mass, mass, or midline shift.  Vascular: Please see MRA findings below.  Skull and upper cervical spine: Normal marrow signal.  Sinuses/Orbits: Mucosal thickening in the left frontal sinus. The orbits are unremarkable.  Other: Fluid in left mastoid air cells.  MRA HEAD FINDINGS  Evaluation is somewhat limited by motion artifact.  Anterior circulation: Both internal carotid arteries are patent to the termini, without stenosis or other abnormality. A1 segments patent. Normal anterior communicating artery. Area of susceptibility in the course of the left A2, secondary to stent placement. Anterior cerebral arteries are otherwise patent to their distal aspects, although evaluation is limited by both motion and susceptibility. No M1 stenosis or occlusion; irregularity in the left M1 is felt to be secondary to motion. Evaluation of the bifurcations and distal MCA branches is limited by motion, however these grossly appear patent.  Posterior  circulation: Diminutive right vertebral artery that terminates primarily in PICA. Patent left vertebral artery. The basilar artery is patent. The superior cerebellar arteries are unremarkable. Fetal origin of the right PCA, with a patent posterior communicating artery and no definite visualization of a right P1. Possible narrowing of the proximal right P2, although evaluation is limited by motion. The left PCA appears patent. The left posterior communicating artery is not definitively visualized.  Anatomic variants: None significant  IMPRESSION: 1. Redemonstrated infarcts in the left ACA and ACA/PCA watershed territory, with one new area of restricted diffusion in the medial right parietal lobe, likely an additional acute infarct, status post A2 stenting. A small amount of associated petechial hemorrhage is noted within the medial left frontal lobe, without significant hemorrhagic transformation. 2. Evaluation of the vasculature is limited by motion. Within this limitation, there is no large vessel occlusion or significant stenosis. Evaluation of the left A2 is limited by both motion and susceptibility artifact from the stent. If further evaluation of the vasculature is indicated, consider CTA.   Electronically Signed   By: Merilyn Baba M.D.   On: 10/02/2021 14:04   CT HEAD WO CONTRAST   Narrative   CLINICAL DATA:  Stroke, follow-up status post mechanical thrombectomy and left A2 stenting  EXAM: CT HEAD WITHOUT CONTRAST  TECHNIQUE: Contiguous axial images were obtained from the base of the skull through the vertex without intravenous contrast.  COMPARISON:  10/01/2021  FINDINGS: Brain: Redemonstrated mild loss of gray-white differentiation in the medial left frontal lobe, consistent with known left ACA occlusion, although the loss of gray-white is less distinct on the current exam. No new area of infarction. No hemorrhage, mass, mass effect, or midline shift. No  hydrocephalus or extra-axial collection.  Vascular: Interval placement of left A2 stent. No hyperdense vessel. Atherosclerotic calcifications in the intracranial carotid and vertebral arteries.  Skull: Normal. Negative for fracture or focal lesion.  Sinuses/Orbits: Mucosal thickening in the left frontal sinus and left anterior ethmoid air cells. The orbits are unremarkable.  Other: Fluid in left mastoid air cells.  IMPRESSION: Status post placement of a left A2 stent, with diminished conspicuity of previously noted loss of gray-white differentiation in the medial left frontal lobe. No new area of infarction.   Electronically Signed   By: Merilyn Baba M.D.   On: 10/01/2021 18:09        PHYSICAL EXAM  General:  Patient is an alert, well-developed female in no acute distress.   NEURO:  Mental Status: AA&Ox3  Speech/Language: speech is without dysarthria or aphasia.  Naming, repetition, fluency, and comprehension intact.  Cranial Nerves:  II: PERRL. III, IV, VI: EOMI. Eyelids elevate symmetrically.  V: Sensation is intact to light touch and symmetrical to face.  VII: Slight right facial droop present.  VIII: hearing intact to voice. IX, X: Phonation  is normal.  XII: tongue is midline without fasciculations. Motor: 5/5 strength to all muscle groups tested. Right side is slightly weaker than left, right hand tremor noted Sensation- Intact to light touch bilaterally, no extinction.  Coordination: FTN intact bilaterally, HKS: no ataxia in BLE.No drift. Right hand tremor present, decreased fine motor skills in right hand.  Left hand orbits right hand Gait- deferred    ASSESSMENT/PLAN Andrea Williamson is a 70 y.o. female 70 year old female with history of HLD admitted for right-sided weakness, aphasia.  Continue to mobilize patient out of bed. Patient is ready for discharge to CIR when a bed is ready. 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 cardioembolic CT head left ACA infarct CTA head and neck- Left A2/ACA occulusion CTP 0 core, 27 cc penumbra Stat MRI showed left ACA infarct, however there is viable tissue compared with CTP IR - left A2 occlusion s/p TICI3 revascularization and left A2 stent MRI head 11/18 - Redemonstrated infarcts in the left ACA and ACA/PCA watershed territory, with one new area of restricted diffusion in the medial right parietal lobe, likely an additional acute infarct, status post A2 stenting.  MRA head post A2 stenting, distal A2 seems patent.  2D Echo EF 65 to 70% LDL 232 HgbA1c 5.0 Lovenox for VTE prophylaxis No antithrombotic prior to admission, now on Brilinta (ticagrelor) 90 mg bid and ASA 81mg  Ongoing aggressive stroke risk factor management Therapy recommendations:  Acute inpatient rehab-  consult placed and received Disposition:  to CIR when bed available  Hypertension Keep BP less than 180/105 Long-term BP goal normotensive 5 mg amlodipine daily PRN labetolol   Hyperlipidemia Home meds:  None  LDL 232, goal < 70 Lipitor 80mg  daily Continue statin at discharge  Tobacco abuse Current smoker Smoking cessation counseling provided Nicotine patch provided Pt is willing to quit  Other Problems B/l arm tremor - chronic  BUN 27 on AM labs-> 28 Encourage PO fluids   Hospital day # [place] , MSN, AGACNP-BC [facility] See Amion for schedule and pager information 10/06/2021 12:19 PM I have personally obtained history,examined this patient, reviewed notes, independently viewed imaging studies, participated in medical decision making and plan of care.ROS completed by me personally and pertinent positives fully documented  I have made any additions or clarifications directly to the above note. Agree with note above.  Patient remains neurologically  stable with a steady improvement.  Await transfer to inpatient rehab after insurance approval and bed availability.  Discussed with patient, husband and rehab coordinator and answered questions.  Greater than 50% time during this 25-minute visit was spent in counseling and coordination of care and discussion with care team and answering questions.  Antony Contras, MD Medical Director [facility] Pager: [phone] 10/06/2021 2:57 PM

## 2021-10-07 ENCOUNTER — Encounter (HOSPITAL_COMMUNITY): Payer: Self-pay | Admitting: Neurology

## 2021-10-07 ENCOUNTER — Other Ambulatory Visit: Payer: Self-pay

## 2021-10-07 ENCOUNTER — Inpatient Hospital Stay (HOSPITAL_COMMUNITY)
Admission: RE | Admit: 2021-10-07 | Discharge: 2021-10-16 | DRG: 057 | Disposition: A | Discharge status: Home / Self Care | Payer: Medicare Other | Source: Intra-hospital | Attending: Physical Medicine and Rehabilitation | Admitting: Physical Medicine and Rehabilitation

## 2021-10-07 DIAGNOSIS — R32 Unspecified urinary incontinence: Secondary | ICD-10-CM | POA: Diagnosis not present

## 2021-10-07 DIAGNOSIS — I69341 Monoplegia of lower limb following cerebral infarction affecting right dominant side: Secondary | ICD-10-CM | POA: Diagnosis not present

## 2021-10-07 DIAGNOSIS — E663 Overweight: Secondary | ICD-10-CM | POA: Diagnosis present

## 2021-10-07 DIAGNOSIS — I6300 Cerebral infarction due to thrombosis of unspecified precerebral artery: Secondary | ICD-10-CM | POA: Diagnosis not present

## 2021-10-07 DIAGNOSIS — E785 Hyperlipidemia, unspecified: Secondary | ICD-10-CM | POA: Diagnosis present

## 2021-10-07 DIAGNOSIS — F17210 Nicotine dependence, cigarettes, uncomplicated: Secondary | ICD-10-CM | POA: Diagnosis present

## 2021-10-07 DIAGNOSIS — I63522 Cerebral infarction due to unspecified occlusion or stenosis of left anterior cerebral artery: Secondary | ICD-10-CM | POA: Diagnosis not present

## 2021-10-07 DIAGNOSIS — N179 Acute kidney failure, unspecified: Secondary | ICD-10-CM | POA: Diagnosis present

## 2021-10-07 DIAGNOSIS — E7800 Pure hypercholesterolemia, unspecified: Secondary | ICD-10-CM | POA: Diagnosis present

## 2021-10-07 DIAGNOSIS — I63322 Cerebral infarction due to thrombosis of left anterior cerebral artery: Secondary | ICD-10-CM | POA: Diagnosis not present

## 2021-10-07 DIAGNOSIS — I69314 Frontal lobe and executive function deficit following cerebral infarction: Secondary | ICD-10-CM

## 2021-10-07 DIAGNOSIS — Z6828 Body mass index (BMI) 28.0-28.9, adult: Secondary | ICD-10-CM

## 2021-10-07 DIAGNOSIS — I10 Essential (primary) hypertension: Secondary | ICD-10-CM | POA: Diagnosis present

## 2021-10-07 DIAGNOSIS — I69393 Ataxia following cerebral infarction: Secondary | ICD-10-CM | POA: Diagnosis not present

## 2021-10-07 DIAGNOSIS — K5900 Constipation, unspecified: Secondary | ICD-10-CM | POA: Diagnosis present

## 2021-10-07 DIAGNOSIS — I69320 Aphasia following cerebral infarction: Secondary | ICD-10-CM | POA: Diagnosis not present

## 2021-10-07 DIAGNOSIS — Z713 Dietary counseling and surveillance: Secondary | ICD-10-CM | POA: Diagnosis not present

## 2021-10-07 MED ORDER — AMLODIPINE BESYLATE 5 MG PO TABS: 5.0000 mg | ORAL_TABLET | Freq: Every day | ORAL | 0 refills | Status: DC

## 2021-10-07 MED ORDER — ATORVASTATIN CALCIUM 80 MG PO TABS
80.0000 mg | ORAL_TABLET | Freq: Every day | ORAL | Status: DC
  Administered 2021-10-08 – 2021-10-16 (×9): 80 mg via ORAL
  Filled 2021-10-07 (×9): qty 1

## 2021-10-07 MED ORDER — ACETAMINOPHEN 325 MG PO TABS: 325.0000 mg | ORAL_TABLET | ORAL | Status: DC | PRN

## 2021-10-07 MED ORDER — ASPIRIN 81 MG PO CHEW
81.0000 mg | CHEWABLE_TABLET | Freq: Every day | ORAL | Status: DC
  Filled 2021-10-07 (×4): qty 1

## 2021-10-07 MED ORDER — POLYETHYLENE GLYCOL 3350 17 G PO PACK: 17.0000 g | PACK | Freq: Every day | ORAL | Status: DC | PRN

## 2021-10-07 MED ORDER — ALUM & MAG HYDROXIDE-SIMETH 200-200-20 MG/5ML PO SUSP: 30.0000 mL | ORAL | Status: DC | PRN

## 2021-10-07 MED ORDER — SENNOSIDES-DOCUSATE SODIUM 8.6-50 MG PO TABS: 1.0000 | ORAL_TABLET | Freq: Every day | ORAL | Status: DC

## 2021-10-07 MED ORDER — BISACODYL 10 MG RE SUPP: 10.0000 mg | Freq: Every day | RECTAL | Status: DC | PRN

## 2021-10-07 MED ORDER — SENNOSIDES-DOCUSATE SODIUM 8.6-50 MG PO TABS
1.0000 | ORAL_TABLET | Freq: Every day | ORAL | Status: DC
  Administered 2021-10-07: 1 via ORAL
  Filled 2021-10-07: qty 1

## 2021-10-07 MED ORDER — TICAGRELOR 90 MG PO TABS: 90.0000 mg | ORAL_TABLET | Freq: Two times a day (BID) | ORAL | 0 refills | Status: DC

## 2021-10-07 MED ORDER — ENOXAPARIN SODIUM 40 MG/0.4ML IJ SOSY: 40.0000 mg | PREFILLED_SYRINGE | INTRAMUSCULAR | 0 refills | Status: DC

## 2021-10-07 MED ORDER — BISACODYL 10 MG RE SUPP: 10.0000 mg | Freq: Every day | RECTAL | 0 refills | Status: DC | PRN

## 2021-10-07 MED ORDER — PROCHLORPERAZINE MALEATE 5 MG PO TABS: 5.0000 mg | ORAL_TABLET | Freq: Four times a day (QID) | ORAL | Status: DC | PRN

## 2021-10-07 MED ORDER — FLEET ENEMA 7-19 GM/118ML RE ENEM: 1.0000 | ENEMA | Freq: Once | RECTAL | Status: DC | PRN

## 2021-10-07 MED ORDER — TICAGRELOR 90 MG PO TABS
90.0000 mg | ORAL_TABLET | Freq: Two times a day (BID) | ORAL | Status: DC
  Filled 2021-10-07 (×3): qty 1

## 2021-10-07 MED ORDER — ASPIRIN 81 MG PO CHEW
81.0000 mg | CHEWABLE_TABLET | Freq: Every day | ORAL | Status: DC
  Administered 2021-10-08 – 2021-10-16 (×9): 81 mg via ORAL
  Filled 2021-10-07 (×9): qty 1

## 2021-10-07 MED ORDER — PROCHLORPERAZINE EDISYLATE 10 MG/2ML IJ SOLN: 5.0000 mg | Freq: Four times a day (QID) | INTRAMUSCULAR | Status: DC | PRN

## 2021-10-07 MED ORDER — DIPHENHYDRAMINE HCL 12.5 MG/5ML PO ELIX: 12.5000 mg | ORAL_SOLUTION | Freq: Four times a day (QID) | ORAL | Status: DC | PRN

## 2021-10-07 MED ORDER — ATORVASTATIN CALCIUM 80 MG PO TABS: 80.0000 mg | ORAL_TABLET | Freq: Every day | ORAL | 0 refills | Status: DC

## 2021-10-07 MED ORDER — PROCHLORPERAZINE 25 MG RE SUPP: 12.5000 mg | Freq: Four times a day (QID) | RECTAL | Status: DC | PRN

## 2021-10-07 MED ORDER — ASPIRIN 81 MG PO CHEW: 81.0000 mg | CHEWABLE_TABLET | Freq: Every day | ORAL | 0 refills | Status: DC

## 2021-10-07 MED ORDER — TRAZODONE HCL 50 MG PO TABS: 25.0000 mg | ORAL_TABLET | Freq: Every evening | ORAL | Status: DC | PRN

## 2021-10-07 MED ORDER — GUAIFENESIN-DM 100-10 MG/5ML PO SYRP: 5.0000 mL | ORAL_SOLUTION | Freq: Four times a day (QID) | ORAL | Status: DC | PRN

## 2021-10-07 MED ORDER — AMLODIPINE BESYLATE 5 MG PO TABS
5.0000 mg | ORAL_TABLET | Freq: Every day | ORAL | Status: DC
  Administered 2021-10-08 – 2021-10-16 (×9): 5 mg via ORAL
  Filled 2021-10-07 (×9): qty 1

## 2021-10-07 MED ORDER — NICOTINE 21 MG/24HR TD PT24
21.0000 mg | MEDICATED_PATCH | Freq: Every day | TRANSDERMAL | Status: DC
  Administered 2021-10-08 – 2021-10-16 (×9): 21 mg via TRANSDERMAL
  Filled 2021-10-07 (×9): qty 1

## 2021-10-07 MED ORDER — NICOTINE 21 MG/24HR TD PT24
21.0000 mg | MEDICATED_PATCH | Freq: Every day | TRANSDERMAL | 0 refills | Status: DC
Start: 2021-10-08 — End: 2021-10-15

## 2021-10-07 MED ORDER — TICAGRELOR 90 MG PO TABS
90.0000 mg | ORAL_TABLET | Freq: Two times a day (BID) | ORAL | Status: DC
  Administered 2021-10-07 – 2021-10-16 (×18): 90 mg via ORAL
  Filled 2021-10-07 (×18): qty 1

## 2021-10-07 MED ORDER — ENOXAPARIN SODIUM 40 MG/0.4ML IJ SOSY
40.0000 mg | PREFILLED_SYRINGE | INTRAMUSCULAR | Status: DC
  Administered 2021-10-07 – 2021-10-15 (×9): 40 mg via SUBCUTANEOUS
  Filled 2021-10-07 (×9): qty 0.4

## 2021-10-07 MED ORDER — SENNOSIDES-DOCUSATE SODIUM 8.6-50 MG PO TABS
1.0000 | ORAL_TABLET | Freq: Every day | ORAL | Status: DC
  Administered 2021-10-08 – 2021-10-16 (×8): 1 via ORAL
  Filled 2021-10-07 (×11): qty 1

## 2021-10-07 NOTE — Discharge Summary (Addendum)
Stroke Discharge Summary  Patient ID: Andrea Williamson   MRN: [id number]      DOB: [birth year]  Date of Admission: 10/01/2021 Date of Discharge: 10/07/2021  Attending Physician:  Stroke, Md, MD, Stroke MD Consultant(s):   None Patient's PCP:  No primary care provider on file.  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 Start taking on: October 08, 2021   aspirin 81 MG chewable tablet Chew 1 tablet (81 mg total) by mouth daily. Start taking on: October 08, 2021   atorvastatin 80 MG tablet Commonly known as: LIPITOR Take 1 tablet (80 mg total) by mouth daily. Start taking on: October 08, 2021   bisacodyl 10 MG suppository Commonly known as: DULCOLAX Place 1 suppository (10 mg total) rectally daily as needed for moderate constipation.   enoxaparin 40 MG/0.4ML injection Commonly known as: LOVENOX Inject 0.4 mLs (40 mg total) into the skin daily.   nicotine 21 mg/24hr patch Commonly known as: NICODERM CQ - dosed in mg/24 hours Place 1 patch (21 mg total) onto the skin daily. Start taking on: October 08, 2021   senna-docusate 8.6-50 MG tablet Commonly known as: Senokot-S Take 1 tablet by mouth daily at 6 (six) AM.   ticagrelor 90 MG Tabs tablet Commonly known as: BRILINTA Take 1 tablet (90 mg total) by mouth 2 (two) times daily.        LABORATORY STUDIES CBC    Component Value Date/Time   WBC 8.3 10/06/2021 0334   RBC 4.20 10/06/2021 0334    HGB 13.9 10/06/2021 0334   HCT 39.9 10/06/2021 0334   PLT 241 10/06/2021 0334   MCV 95.0 10/06/2021 0334   MCH 33.1 10/06/2021 0334   MCHC 34.8 10/06/2021 0334   RDW 12.7 10/06/2021 0334   LYMPHSABS 1.1 10/01/2021 1130   MONOABS 0.5 10/01/2021 1130   EOSABS 0.0 10/01/2021 1130   BASOSABS 0.0 10/01/2021 1130   CMP    Component Value Date/Time   NA 137 10/06/2021 0334   K 3.7 10/06/2021 0334   CL 106 10/06/2021 0334   CO2 23 10/06/2021 0334   GLUCOSE 116 (H) 10/06/2021 0334   BUN 28 (H) 10/06/2021 0334   CREATININE 0.82 10/06/2021 0334   CALCIUM 9.5 10/06/2021 0334   PROT 7.2 10/01/2021 1130   ALBUMIN 3.9 10/01/2021 1130   AST 31 10/01/2021 1130   ALT 25 10/01/2021 1130   ALKPHOS 75 10/01/2021 1130   BILITOT 1.0 10/01/2021 1130   GFRNONAA >60 10/06/2021 0334   COAGS Lab Results  Component Value Date   INR 0.9 10/01/2021   Lipid Panel    Component Value Date/Time   CHOL 317 (H) 10/02/2021 0401   TRIG 182 (H) 10/02/2021 0401   HDL 49 10/02/2021 0401   CHOLHDL 6.5 10/02/2021 0401   VLDL 36 10/02/2021 0401   LDLCALC 232 (H) 10/02/2021 0401   HgbA1C  Lab Results  Component Value Date  HGBA1C 5.0 10/02/2021   Urinalysis    Component Value Date/Time   COLORURINE STRAW (A) 10/01/2021 1723   APPEARANCEUR CLEAR 10/01/2021 1723   LABSPEC >1.046 (H) 10/01/2021 1723   PHURINE 6.0 10/01/2021 1723   GLUCOSEU NEGATIVE 10/01/2021 1723   HGBUR NEGATIVE 10/01/2021 1723   BILIRUBINUR NEGATIVE 10/01/2021 1723   KETONESUR 5 (A) 10/01/2021 1723   PROTEINUR 30 (A) 10/01/2021 1723   NITRITE NEGATIVE 10/01/2021 1723   LEUKOCYTESUR NEGATIVE 10/01/2021 1723   Urine Drug Screen     Component Value Date/Time   LABOPIA NONE DETECTED 10/01/2021 1723   COCAINSCRNUR NONE DETECTED 10/01/2021 1723   LABBENZ NONE DETECTED 10/01/2021 1723   AMPHETMU NONE DETECTED 10/01/2021 1723   THCU NONE DETECTED 10/01/2021 1723   LABBARB NONE DETECTED 10/01/2021 1723    Alcohol Level     Component Value Date/Time   ETH <10 10/01/2021 1131     SIGNIFICANT DIAGNOSTIC STUDIES CT HEAD WO CONTRAST  Result Date: 10/01/2021 CLINICAL DATA:  Stroke, follow-up status post mechanical thrombectomy and left A2 stenting EXAM: CT HEAD WITHOUT CONTRAST TECHNIQUE: Contiguous axial images were obtained from the base of the skull through the vertex without intravenous contrast. COMPARISON:  10/01/2021 FINDINGS: Brain: Redemonstrated mild loss of gray-white differentiation in the medial left frontal lobe, consistent with known left ACA occlusion, although the loss of gray-white is less distinct on the current exam. No new area of infarction. No hemorrhage, mass, mass effect, or midline shift. No hydrocephalus or extra-axial collection. Vascular: Interval placement of left A2 stent. No hyperdense vessel. Atherosclerotic calcifications in the intracranial carotid and vertebral arteries. Skull: Normal. Negative for fracture or focal lesion. Sinuses/Orbits: Mucosal thickening in the left frontal sinus and left anterior ethmoid air cells. The orbits are unremarkable. Other: Fluid in left mastoid air cells. IMPRESSION: Status post placement of a left A2 stent, with diminished conspicuity of previously noted loss of gray-white differentiation in the medial left frontal lobe. No new area of infarction. Electronically Signed   By: Merilyn Baba M.D.   On: 10/01/2021 18:09   MR ANGIO HEAD WO CONTRAST  Result Date: 10/02/2021 CLINICAL DATA:  Stroke, follow-up EXAM: MRI HEAD WITHOUT CONTRAST MRA HEAD WITHOUT CONTRAST TECHNIQUE: Multiplanar, multi-echo pulse sequences of the brain and surrounding structures were acquired without intravenous contrast. Angiographic images of the Circle of Willis were acquired using MRA technique without intravenous contrast. COMPARISON:  MRI 10/01/2021, correlation is also made with CT head 10/01/2021 11:59 a.m. and 5:01 p.m. FINDINGS: MRI HEAD FINDINGS Evaluation is somewhat limited  by motion artifact. Brain: Redemonstrated areas of restricted diffusion in the medial left frontal and parietal lobes, in both the left ACA territory and watershed territory. Additional small area of restricted diffusion in the left inferior cerebellum (series 3, image 12), unchanged. The majority of the infarcts were seen on the prior exam, with the exception of a small area of restricted diffusion in the parafalcine right parietal lobe (series 3, image 37). Many of these areas are not associated with increased T2 signal, and there is susceptibility in the area of the left parafalcine frontal lobe (series 7, image 17), likely small amount of associated hemorrhage. Additional susceptibility is noted in the area of the left A2, corresponding to a stent. No mass, mass, or midline shift. Vascular: Please see MRA findings below. Skull and upper cervical spine: Normal marrow signal. Sinuses/Orbits: Mucosal thickening in the left frontal sinus. The orbits are unremarkable. Other: Fluid in left mastoid air cells. MRA HEAD FINDINGS  Evaluation is somewhat limited by motion artifact. Anterior circulation: Both internal carotid arteries are patent to the termini, without stenosis or other abnormality. A1 segments patent. Normal anterior communicating artery. Area of susceptibility in the course of the left A2, secondary to stent placement. Anterior cerebral arteries are otherwise patent to their distal aspects, although evaluation is limited by both motion and susceptibility. No M1 stenosis or occlusion; irregularity in the left M1 is felt to be secondary to motion. Evaluation of the bifurcations and distal MCA branches is limited by motion, however these grossly appear patent. Posterior circulation: Diminutive right vertebral artery that terminates primarily in PICA. Patent left vertebral artery. The basilar artery is patent. The superior cerebellar arteries are unremarkable. Fetal origin of the right PCA, with a patent  posterior communicating artery and no definite visualization of a right P1. Possible narrowing of the proximal right P2, although evaluation is limited by motion. The left PCA appears patent. The left posterior communicating artery is not definitively visualized. Anatomic variants: None significant IMPRESSION: 1. Redemonstrated infarcts in the left ACA and ACA/PCA watershed territory, with one new area of restricted diffusion in the medial right parietal lobe, likely an additional acute infarct, status post A2 stenting. A small amount of associated petechial hemorrhage is noted within the medial left frontal lobe, without significant hemorrhagic transformation. 2. Evaluation of the vasculature is limited by motion. Within this limitation, there is no large vessel occlusion or significant stenosis. Evaluation of the left A2 is limited by both motion and susceptibility artifact from the stent. If further evaluation of the vasculature is indicated, consider CTA. Electronically Signed   By: Merilyn Baba M.D.   On: 10/02/2021 14:04   MR BRAIN WO CONTRAST  Result Date: 10/02/2021 CLINICAL DATA:  Stroke, follow-up EXAM: MRI HEAD WITHOUT CONTRAST MRA HEAD WITHOUT CONTRAST TECHNIQUE: Multiplanar, multi-echo pulse sequences of the brain and surrounding structures were acquired without intravenous contrast. Angiographic images of the Circle of Willis were acquired using MRA technique without intravenous contrast. COMPARISON:  MRI 10/01/2021, correlation is also made with CT head 10/01/2021 11:59 a.m. and 5:01 p.m. FINDINGS: MRI HEAD FINDINGS Evaluation is somewhat limited by motion artifact. Brain: Redemonstrated areas of restricted diffusion in the medial left frontal and parietal lobes, in both the left ACA territory and watershed territory. Additional small area of restricted diffusion in the left inferior cerebellum (series 3, image 12), unchanged. The majority of the infarcts were seen on the prior exam, with the  exception of a small area of restricted diffusion in the parafalcine right parietal lobe (series 3, image 37). Many of these areas are not associated with increased T2 signal, and there is susceptibility in the area of the left parafalcine frontal lobe (series 7, image 17), likely small amount of associated hemorrhage. Additional susceptibility is noted in the area of the left A2, corresponding to a stent. No mass, mass, or midline shift. Vascular: Please see MRA findings below. Skull and upper cervical spine: Normal marrow signal. Sinuses/Orbits: Mucosal thickening in the left frontal sinus. The orbits are unremarkable. Other: Fluid in left mastoid air cells. MRA HEAD FINDINGS Evaluation is somewhat limited by motion artifact. Anterior circulation: Both internal carotid arteries are patent to the termini, without stenosis or other abnormality. A1 segments patent. Normal anterior communicating artery. Area of susceptibility in the course of the left A2, secondary to stent placement. Anterior cerebral arteries are otherwise patent to their distal aspects, although evaluation is limited by both motion and susceptibility. No M1 stenosis  or occlusion; irregularity in the left M1 is felt to be secondary to motion. Evaluation of the bifurcations and distal MCA branches is limited by motion, however these grossly appear patent. Posterior circulation: Diminutive right vertebral artery that terminates primarily in PICA. Patent left vertebral artery. The basilar artery is patent. The superior cerebellar arteries are unremarkable. Fetal origin of the right PCA, with a patent posterior communicating artery and no definite visualization of a right P1. Possible narrowing of the proximal right P2, although evaluation is limited by motion. The left PCA appears patent. The left posterior communicating artery is not definitively visualized. Anatomic variants: None significant IMPRESSION: 1. Redemonstrated infarcts in the left ACA and  ACA/PCA watershed territory, with one new area of restricted diffusion in the medial right parietal lobe, likely an additional acute infarct, status post A2 stenting. A small amount of associated petechial hemorrhage is noted within the medial left frontal lobe, without significant hemorrhagic transformation. 2. Evaluation of the vasculature is limited by motion. Within this limitation, there is no large vessel occlusion or significant stenosis. Evaluation of the left A2 is limited by both motion and susceptibility artifact from the stent. If further evaluation of the vasculature is indicated, consider CTA. Electronically Signed   By: Merilyn Baba M.D.   On: 10/02/2021 14:04   MR BRAIN WO CONTRAST  Result Date: 10/01/2021 CLINICAL DATA:  Acute neuro deficit. EXAM: MRI HEAD WITHOUT CONTRAST TECHNIQUE: Multiplanar, multiecho pulse sequences of the brain and surrounding structures were obtained without intravenous contrast. COMPARISON:  CT head 10/01/2021 FINDINGS: Brain: Acute infarct left ACA territory. This primarily involves the medial frontal lobe on the left. Additional watershed infarct in the left frontal parietal white matter. Mild atrophy. Negative for hydrocephalus. Chronic microvascular ischemic change in the white matter and pons. 1 cm rounded hyperintensity left brachium pontis, indeterminate Study limited to diffusion weighted imaging and FLAIR imaging per Dr. Leonel Ramsay. IMPRESSION: Acute infarct left ACA territory. Additional area of acute infarct in the left watershed territory. Moderate chronic microvascular ischemic change in the white matter and pons 1 cm lesion left brachium pontis of indeterminate etiology. Possible chronic infarct, demyelinating disease or less likely mass lesion. Electronically Signed   By: Franchot Gallo M.D.   On: 10/01/2021 13:16   IR Intra Cran Stent  Result Date: 10/02/2021 INDICATION: 70 year old female with past medical history significant for hyperlipidemia  presenting with right leg weakness and aphasia. Leg weakness status 2 days ago with worsening this morning when difficulty speaking was also noted. Her last known well was 09/29/2021; NIHSS 6; baseline modified Rankin scale 0. Head CT showed hypodense areas in the medial left frontal lobe CT angiogram showed an occlusion of the left A2/ACA. CT perfusion showed no core infarct with a penumbra of 27 mL. Given discrepancy between CT perfusion and noncontrast CT, an MRI of the brain was obtained showing scattered infarcts in the left ACA territory. Given waxing and waning symptoms and imaging/clinical mismatch, decision was made to proceed with diagnostic cerebral angiogram and mechanical thrombectomy after discussion with patient and her husband. EXAM: FLUOROSCOPY GUIDED VASCULAR ACCESS DIAGNOSTIC CEREBRAL ANGIOGRAM MECHANICAL THROMBECTOMY INTRACRANIAL STENTING FLAT PANEL HEAD CT COMPARISON:  CT/CT angiogram of the head and neck October 01, 2021. MEDICATIONS: Intra arterial Cangrelor loading dose and drip. ANESTHESIA/SEDATION: The procedure was performed under general anesthesia. CONTRAST:  100 mL of Omnipaque 300 milligram/mL FLUOROSCOPY TIME:  Fluoroscopy Time: 22 minutes 30 seconds (1,164 mGy). COMPLICATIONS: SIR LEVEL B - Normal therapy, includes overnight admission for  observation. TECHNIQUE: Informed written consent was obtained from the patient and her husband after a thorough discussion of the procedural risks, benefits and alternatives. All questions were addressed. Maximal Sterile Barrier Technique was utilized including caps, mask, sterile gowns, sterile gloves, sterile drape, hand hygiene and skin antiseptic. A timeout was performed prior to the initiation of the procedure. The right groin was prepped and draped in the usual sterile fashion. Using a micropuncture kit and the modified Seldinger technique, access was gained to the right common femoral artery and an 8 French sheath was placed. Real-time  ultrasound guidance was utilized for vascular access including the acquisition of a permanent ultrasound image documenting patency of the accessed vessel. Under fluoroscopy, a Zoom 88 guide catheter was navigated over a 6 [place] Berenstein 2 catheter and a 0.035" Terumo Glidewire into the aortic arch. The catheter was placed into the left common carotid artery and then advanced into the left internal carotid artery. The inner catheter was removed. Frontal and lateral angiograms of the head were obtained followed by magnified right anterior oblique, magnified left anterior oblique and magnified lateral views of the head. FINDINGS: 1. The right common femoral artery has normal caliber, adequate for vascular access. 2. 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 3. Intracranial atherosclerotic disease with multifocal areas of mild stenosis in the left ACA and MCA vascular trees. PROCEDURE: Under biplane roadmap, a Zoom 55 aspiration catheter was navigated over a phenom 21 microcatheter and an Aristotle 18 microguidewire into the cavernous segment of the left ICA. The aspiration catheter was then advanced to the level of occlusion in the left A2/ACA and connected to an aspiration pump. Continuous aspiration was performed for 2 minutes. The guide catheter was connected to a VacLok syringe. The aspiration catheter was subsequently removed under constant aspiration. The guide catheter was aspirated for debris. Left ICA angiograms showed complete recanalization of the left ACA (TICI3) with luminal irregularity of the A2 segment concerning for dissection. There is no flow limitation. Delay left ICA angiograms with magnified frontal and lateral views of the head showed clot formation within the dissected segment though without flow limitation. Due to concern for clot propagation and reocclusion, decision was made to treat dissection with stenting. Patient received a loading dose of cangrelor and a drip was subsequently  started. Under biplane roadmap, an SL 10 microcatheter was navigated over an Aristotle 14 micro guidewire into the left A3/ACA segment. Then, a 3 x 21 mm neuroform atlas stent was deployed spanning the A2 segment, covering the dissected segment. Follow-up magnified frontal and lateral angiograms of the head showed adequate stent positioning with fully expanded stent and excellent anterograde flow. Flat panel CT of the head was obtained and post processed in a separate workstation with concurrent attending physician supervision. Selected images were sent to PACS. No evidence of hemorrhagic complication. Delayed magnified frontal and lateral views of the head centered on the recently deployed left A2/ACA stent. No in stent clot formation seen. Preserved anterograde flow. The catheter was subsequently withdrawn. Right common femoral artery angiograms were obtained in frontal and lateral views. The puncture is at the level of the common femoral artery which has adequate caliber for closure device. The femoral sheath was exchanged over the wire for a Perclose prostyle which was utilized for access closure. Immediate hemostasis was achieved. IMPRESSION: 1. Successful mechanical thrombectomy with 1 pass direct contact aspiration for treatment of a left A2/ACA occlusion. 2. Procedure was complicated by catheter related dissection treated with  stenting with preservation of the anterograde flow. PLAN: Dual anti-platelet therapy for 6 months with evaluation of stent patency at that time. Electronically Signed   By: Pedro Earls M.D.   On: 10/02/2021 13:03   IR CT Head Ltd  INDICATION: 70 year old female with past medical history significant for hyperlipidemia presenting with right leg weakness and aphasia. Leg weakness status 2 days ago with worsening this morning when difficulty speaking was also noted. Her last known well was 09/29/2021; NIHSS 6; baseline modified Rankin scale 0. Head CT showed hypodense  areas in the medial left frontal lobe CT angiogram showed an occlusion of the left A2/ACA. CT perfusion showed no core infarct with a penumbra of 27 mL. Given discrepancy between CT perfusion and noncontrast CT, an MRI of the brain was obtained showing scattered infarcts in the left ACA territory. Given waxing and waning symptoms and imaging/clinical mismatch, decision was made to proceed with diagnostic cerebral angiogram and mechanical thrombectomy after discussion with patient and her husband.   EXAM: FLUOROSCOPY GUIDED VASCULAR ACCESS   DIAGNOSTIC CEREBRAL ANGIOGRAM   MECHANICAL THROMBECTOMY   INTRACRANIAL STENTING   FLAT PANEL HEAD CT   COMPARISON:  CT/CT angiogram of the head and neck October 01, 2021.   MEDICATIONS: Intra arterial Cangrelor loading dose and drip.   ANESTHESIA/SEDATION: The procedure was performed under general anesthesia.   CONTRAST:  100 mL of Omnipaque 300 milligram/mL   FLUOROSCOPY TIME:  Fluoroscopy Time: 22 minutes 30 seconds (1,164 mGy).   COMPLICATIONS: SIR LEVEL B - Normal therapy, includes overnight admission for observation.   TECHNIQUE: Informed written consent was obtained from the patient and her husband after a thorough discussion of the procedural risks, benefits and alternatives. All questions were addressed.   Maximal Sterile Barrier Technique was utilized including caps, mask, sterile gowns, sterile gloves, sterile drape, hand hygiene and skin antiseptic. A timeout was performed prior to the initiation of the procedure.   The right groin was prepped and draped in the usual sterile fashion. Using a micropuncture kit and the modified Seldinger technique, access was gained to the right common femoral artery and an 8 French sheath was placed. Real-time ultrasound guidance was utilized for vascular access including the acquisition of a permanent ultrasound image documenting patency of the accessed vessel.   Under fluoroscopy, a Zoom 88 guide catheter was navigated over a 6  [place] Berenstein 2 catheter and a 0.035" Terumo Glidewire into the aortic arch. The catheter was placed into the left common carotid artery and then advanced into the left internal carotid artery. The inner catheter was removed. Frontal and lateral angiograms of the head were obtained followed by magnified right anterior oblique, magnified left anterior oblique and magnified lateral views of the head.   FINDINGS: 1. The right common femoral artery has normal caliber, adequate for vascular access. 2. 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 3. Intracranial atherosclerotic disease with multifocal areas of mild stenosis in the left ACA and MCA vascular trees.   PROCEDURE: Under biplane roadmap, a Zoom 55 aspiration catheter was navigated over a phenom 21 microcatheter and an Aristotle 18 microguidewire into the cavernous segment of the left ICA. The aspiration catheter was then advanced to the level of occlusion in the left A2/ACA and connected to an aspiration pump. Continuous aspiration was performed for 2 minutes. The guide catheter was connected to a VacLok syringe. The aspiration catheter was subsequently removed under constant aspiration. The guide catheter was aspirated for debris.  Left ICA angiograms showed complete recanalization of the left ACA (TICI3) with luminal irregularity of the A2 segment concerning for dissection. There is no flow limitation.   Delay left ICA angiograms with magnified frontal and lateral views of the head showed clot formation within the dissected segment though without flow limitation. Due to concern for clot propagation and reocclusion, decision was made to treat dissection with stenting.   Patient received a loading dose of cangrelor and a drip was subsequently started.   Under biplane roadmap, an SL 10 microcatheter was navigated over an Aristotle 14 micro guidewire into the left A3/ACA segment. Then, a 3 x 21 mm neuroform atlas stent was deployed spanning the A2 segment,  covering the dissected segment. Follow-up magnified frontal and lateral angiograms of the head showed adequate stent positioning with fully expanded stent and excellent anterograde flow.   Flat panel CT of the head was obtained and post processed in a separate workstation with concurrent attending physician supervision. Selected images were sent to PACS. No evidence of hemorrhagic complication.   Delayed magnified frontal and lateral views of the head centered on the recently deployed left A2/ACA stent. No in stent clot formation seen. Preserved anterograde flow.   The catheter was subsequently withdrawn. Right common femoral artery angiograms were obtained in frontal and lateral views. The puncture is at the level of the common femoral artery which has adequate caliber for closure device. The femoral sheath was exchanged over the wire for a Perclose prostyle which was utilized for access closure. Immediate hemostasis was achieved.   IMPRESSION: 1. Successful mechanical thrombectomy with 1 pass direct contact aspiration for treatment of a left A2/ACA occlusion. 2. Procedure was complicated by catheter related dissection treated with stenting with preservation of the anterograde flow.   PLAN: Dual anti-platelet therapy for 6 months with evaluation of stent patency at that time.     Electronically Signed   By: Pedro Earls M.D.   On: 10/02/2021 13:03    IR US Guide Vasc Access Right  Result Date: 10/02/2021 INDICATION: 70 year old female with past medical history significant for hyperlipidemia presenting with right leg weakness and aphasia. Leg weakness status 2 days ago with worsening this morning when difficulty speaking was also noted. Her last known well was 09/29/2021; NIHSS 6; baseline modified Rankin scale 0. Head CT showed hypodense areas in the medial left frontal lobe CT angiogram showed an occlusion of the left A2/ACA. CT perfusion showed no core infarct with a penumbra of 27 mL. Given  discrepancy between CT perfusion and noncontrast CT, an MRI of the brain was obtained showing scattered infarcts in the left ACA territory. Given waxing and waning symptoms and imaging/clinical mismatch, decision was made to proceed with diagnostic cerebral angiogram and mechanical thrombectomy after discussion with patient and her husband. EXAM: FLUOROSCOPY GUIDED VASCULAR ACCESS DIAGNOSTIC CEREBRAL ANGIOGRAM MECHANICAL THROMBECTOMY INTRACRANIAL STENTING FLAT PANEL HEAD CT COMPARISON:  CT/CT angiogram of the head and neck October 01, 2021. MEDICATIONS: Intra arterial Cangrelor loading dose and drip. ANESTHESIA/SEDATION: The procedure was performed under general anesthesia. CONTRAST:  100 mL of Omnipaque 300 milligram/mL FLUOROSCOPY TIME:  Fluoroscopy Time: 22 minutes 30 seconds (1,164 mGy). COMPLICATIONS: SIR LEVEL B - Normal therapy, includes overnight admission for observation. TECHNIQUE: Informed written consent was obtained from the patient and her husband after a thorough discussion of the procedural risks, benefits and alternatives. All questions were addressed. Maximal Sterile Barrier Technique was utilized including caps, mask, sterile gowns, sterile gloves, sterile drape, hand  hygiene and skin antiseptic. A timeout was performed prior to the initiation of the procedure. The right groin was prepped and draped in the usual sterile fashion. Using a micropuncture kit and the modified Seldinger technique, access was gained to the right common femoral artery and an 8 French sheath was placed. Real-time ultrasound guidance was utilized for vascular access including the acquisition of a permanent ultrasound image documenting patency of the accessed vessel. Under fluoroscopy, a Zoom 88 guide catheter was navigated over a 6 [place] Berenstein 2 catheter and a 0.035" Terumo Glidewire into the aortic arch. The catheter was placed into the left common carotid artery and then advanced into the left internal carotid  artery. The inner catheter was removed. Frontal and lateral angiograms of the head were obtained followed by magnified right anterior oblique, magnified left anterior oblique and magnified lateral views of the head. FINDINGS: 1. The right common femoral artery has normal caliber, adequate for vascular access. 2. 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 3. Intracranial atherosclerotic disease with multifocal areas of mild stenosis in the left ACA and MCA vascular trees. PROCEDURE: Under biplane roadmap, a Zoom 55 aspiration catheter was navigated over a phenom 21 microcatheter and an Aristotle 18 microguidewire into the cavernous segment of the left ICA. The aspiration catheter was then advanced to the level of occlusion in the left A2/ACA and connected to an aspiration pump. Continuous aspiration was performed for 2 minutes. The guide catheter was connected to a VacLok syringe. The aspiration catheter was subsequently removed under constant aspiration. The guide catheter was aspirated for debris. Left ICA angiograms showed complete recanalization of the left ACA (TICI3) with luminal irregularity of the A2 segment concerning for dissection. There is no flow limitation. Delay left ICA angiograms with magnified frontal and lateral views of the head showed clot formation within the dissected segment though without flow limitation. Due to concern for clot propagation and reocclusion, decision was made to treat dissection with stenting. Patient received a loading dose of cangrelor and a drip was subsequently started. Under biplane roadmap, an SL 10 microcatheter was navigated over an Aristotle 14 micro guidewire into the left A3/ACA segment. Then, a 3 x 21 mm neuroform atlas stent was deployed spanning the A2 segment, covering the dissected segment. Follow-up magnified frontal and lateral angiograms of the head showed adequate stent positioning with fully expanded stent and excellent anterograde flow. Flat panel CT  of the head was obtained and post processed in a separate workstation with concurrent attending physician supervision. Selected images were sent to PACS. No evidence of hemorrhagic complication. Delayed magnified frontal and lateral views of the head centered on the recently deployed left A2/ACA stent. No in stent clot formation seen. Preserved anterograde flow. The catheter was subsequently withdrawn. Right common femoral artery angiograms were obtained in frontal and lateral views. The puncture is at the level of the common femoral artery which has adequate caliber for closure device. The femoral sheath was exchanged over the wire for a Perclose prostyle which was utilized for access closure. Immediate hemostasis was achieved. IMPRESSION: 1. Successful mechanical thrombectomy with 1 pass direct contact aspiration for treatment of a left A2/ACA occlusion. 2. Procedure was complicated by catheter related dissection treated with stenting with preservation of the anterograde flow. PLAN: Dual anti-platelet therapy for 6 months with evaluation of stent patency at that time. Electronically Signed   By: Pedro Earls M.D.   On: 10/02/2021 13:03   ECHOCARDIOGRAM COMPLETE  Result  Date: 10/02/2021    ECHOCARDIOGRAM REPORT   Patient Name:   Andrea Williamson Date of Exam: 10/02/2021 Medical Rec #:  [id number]  Height:       63.0 in Accession #:    [id number] Weight:       161.6 lb Date of Birth:  1950-12-07  BSA:          1.766 m Patient Age:    17 years   BP:           111/95 mmHg Patient Gender: F          HR:           81 bpm. Exam Location:  Inpatient Procedure: 2D Echo, Cardiac Doppler and Color Doppler Indications:    Stroke  History:        Patient has no prior history of Echocardiogram examinations.  Sonographer:    Merrie Roof RDCS Referring Phys: [phone] MCNEILL P [place]  1. Left ventricular ejection fraction, by estimation, is 65 to 70%. The left ventricle has normal function. The left  ventricle has no regional wall motion abnormalities. There is mild concentric left ventricular hypertrophy. Left ventricular diastolic parameters are consistent with Grade I diastolic dysfunction (impaired relaxation).  2. Right ventricular systolic function is normal. The right ventricular size is normal. There is normal pulmonary artery systolic pressure.  3. Left atrial size was moderately dilated.  4. The mitral valve is normal in structure. No evidence of mitral valve regurgitation. No evidence of mitral stenosis.  5. The aortic valve is tricuspid. Aortic valve regurgitation is not visualized. No aortic stenosis is present. Aortic valve area, by VTI measures 2.43 cm. Aortic valve mean gradient measures 10.0 mmHg. Aortic valve Vmax measures 2.09 m/s.  6. The inferior vena cava is normal in size with greater than 50% respiratory variability, suggesting right atrial pressure of 3 mmHg. FINDINGS  Left Ventricle: Left ventricular ejection fraction, by estimation, is 65 to 70%. The left ventricle has normal function. The left ventricle has no regional wall motion abnormalities. The left ventricular internal cavity size was normal in size. There is  mild concentric left ventricular hypertrophy. Left ventricular diastolic parameters are consistent with Grade I diastolic dysfunction (impaired relaxation). Indeterminate filling pressures. Right Ventricle: The right ventricular size is normal. No increase in right ventricular wall thickness. Right ventricular systolic function is normal. There is normal pulmonary artery systolic pressure. The tricuspid regurgitant velocity is 2.33 m/s, and  with an assumed right atrial pressure of 3 mmHg, the estimated right ventricular systolic pressure is 56.3 mmHg. Left Atrium: Left atrial size was moderately dilated. Right Atrium: Right atrial size was normal in size. Pericardium: Trivial pericardial effusion is present. Mitral Valve: The mitral valve is normal in structure. No  evidence of mitral valve regurgitation. No evidence of mitral valve stenosis. Tricuspid Valve: The tricuspid valve is normal in structure. Tricuspid valve regurgitation is trivial. No evidence of tricuspid stenosis. Aortic Valve: The aortic valve is tricuspid. Aortic valve regurgitation is not visualized. No aortic stenosis is present. Aortic valve mean gradient measures 10.0 mmHg. Aortic valve peak gradient measures 17.5 mmHg. Aortic valve area, by VTI measures 2.43 cm. Pulmonic Valve: The pulmonic valve was normal in structure. Pulmonic valve regurgitation is trivial. No evidence of pulmonic stenosis. Aorta: The aortic root is normal in size and structure. Venous: The inferior vena cava is normal in size with greater than 50% respiratory variability, suggesting right atrial pressure of 3 mmHg. IAS/Shunts: No atrial level  shunt detected by color flow Doppler.  LEFT VENTRICLE PLAX 2D LVIDd:         5.00 cm   Diastology LVIDs:         3.50 cm   LV e' medial:    7.94 cm/s LV PW:         1.20 cm   LV E/e' medial:  11.1 LV IVS:        1.10 cm   LV e' lateral:   9.57 cm/s LVOT diam:     1.90 cm   LV E/e' lateral: 9.2 LV SV:         96 LV SV Index:   54 LVOT Area:     2.84 cm  RIGHT VENTRICLE             IVC RV Basal diam:  4.10 cm     IVC diam: 1.80 cm RV Mid diam:    3.10 cm RV S prime:     21.60 cm/s TAPSE (M-mode): 2.6 cm LEFT ATRIUM             Index        RIGHT ATRIUM           Index LA diam:        3.30 cm 1.87 cm/m   RA Area:     17.10 cm LA Vol (A2C):   61.6 ml 34.88 ml/m  RA Volume:   44.30 ml  25.08 ml/m LA Vol (A4C):   59.6 ml 33.75 ml/m LA Biplane Vol: 62.0 ml 35.11 ml/m  AORTIC VALVE AV Area (Vmax):    2.28 cm AV Area (Vmean):   2.38 cm AV Area (VTI):     2.43 cm AV Vmax:           209.00 cm/s AV Vmean:          143.000 cm/s AV VTI:            0.395 m AV Peak Grad:      17.5 mmHg AV Mean Grad:      10.0 mmHg LVOT Vmax:         168.00 cm/s LVOT Vmean:        120.000 cm/s LVOT VTI:          0.339 m  LVOT/AV VTI ratio: 0.86  AORTA Ao Root diam: 2.90 cm Ao Asc diam:  3.10 cm MITRAL VALVE                TRICUSPID VALVE MV Area (PHT): 3.91 cm     TR Peak grad:   21.7 mmHg MV Decel Time: 194 msec     TR Vmax:        233.00 cm/s MV E velocity: 88.00 cm/s MV A velocity: 107.00 cm/s  SHUNTS MV E/A ratio:  0.82         Systemic VTI:  0.34 m                             Systemic Diam: 1.90 cm Skeet Latch MD Electronically signed by Skeet Latch MD Signature Date/Time: 10/02/2021/3:30:10 PM    Final    IR PERCUTANEOUS ART THROMBECTOMY/INFUSION INTRACRANIAL INC DIAG ANGIO  Result Date: 10/02/2021 INDICATION: 70 year old female with past medical history significant for hyperlipidemia presenting with right leg weakness and aphasia. Leg weakness status 2 days ago with worsening this morning when difficulty speaking was also noted. Her last known well was 09/29/2021; NIHSS  6; baseline modified Rankin scale 0. Head CT showed hypodense areas in the medial left frontal lobe CT angiogram showed an occlusion of the left A2/ACA. CT perfusion showed no core infarct with a penumbra of 27 mL. Given discrepancy between CT perfusion and noncontrast CT, an MRI of the brain was obtained showing scattered infarcts in the left ACA territory. Given waxing and waning symptoms and imaging/clinical mismatch, decision was made to proceed with diagnostic cerebral angiogram and mechanical thrombectomy after discussion with patient and her husband. EXAM: FLUOROSCOPY GUIDED VASCULAR ACCESS DIAGNOSTIC CEREBRAL ANGIOGRAM MECHANICAL THROMBECTOMY INTRACRANIAL STENTING FLAT PANEL HEAD CT COMPARISON:  CT/CT angiogram of the head and neck October 01, 2021. MEDICATIONS: Intra arterial Cangrelor loading dose and drip. ANESTHESIA/SEDATION: The procedure was performed under general anesthesia. CONTRAST:  100 mL of Omnipaque 300 milligram/mL FLUOROSCOPY TIME:  Fluoroscopy Time: 22 minutes 30 seconds (1,164 mGy). COMPLICATIONS: SIR LEVEL B - Normal  therapy, includes overnight admission for observation. TECHNIQUE: Informed written consent was obtained from the patient and her husband after a thorough discussion of the procedural risks, benefits and alternatives. All questions were addressed. Maximal Sterile Barrier Technique was utilized including caps, mask, sterile gowns, sterile gloves, sterile drape, hand hygiene and skin antiseptic. A timeout was performed prior to the initiation of the procedure. The right groin was prepped and draped in the usual sterile fashion. Using a micropuncture kit and the modified Seldinger technique, access was gained to the right common femoral artery and an 8 French sheath was placed. Real-time ultrasound guidance was utilized for vascular access including the acquisition of a permanent ultrasound image documenting patency of the accessed vessel. Under fluoroscopy, a Zoom 88 guide catheter was navigated over a 6 [place] Berenstein 2 catheter and a 0.035" Terumo Glidewire into the aortic arch. The catheter was placed into the left common carotid artery and then advanced into the left internal carotid artery. The inner catheter was removed. Frontal and lateral angiograms of the head were obtained followed by magnified right anterior oblique, magnified left anterior oblique and magnified lateral views of the head. FINDINGS: 1. The right common femoral artery has normal caliber, adequate for vascular access. 2. 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 3. Intracranial atherosclerotic disease with multifocal areas of mild stenosis in the left ACA and MCA vascular trees. PROCEDURE: Under biplane roadmap, a Zoom 55 aspiration catheter was navigated over a phenom 21 microcatheter and an Aristotle 18 microguidewire into the cavernous segment of the left ICA. The aspiration catheter was then advanced to the level of occlusion in the left A2/ACA and connected to an aspiration pump. Continuous aspiration was performed for 2 minutes.  The guide catheter was connected to a VacLok syringe. The aspiration catheter was subsequently removed under constant aspiration. The guide catheter was aspirated for debris. Left ICA angiograms showed complete recanalization of the left ACA (TICI3) with luminal irregularity of the A2 segment concerning for dissection. There is no flow limitation. Delay left ICA angiograms with magnified frontal and lateral views of the head showed clot formation within the dissected segment though without flow limitation. Due to concern for clot propagation and reocclusion, decision was made to treat dissection with stenting. Patient received a loading dose of cangrelor and a drip was subsequently started. Under biplane roadmap, an SL 10 microcatheter was navigated over an Aristotle 14 micro guidewire into the left A3/ACA segment. Then, a 3 x 21 mm neuroform atlas stent was deployed spanning the A2 segment, covering the dissected segment. Follow-up  magnified frontal and lateral angiograms of the head showed adequate stent positioning with fully expanded stent and excellent anterograde flow. Flat panel CT of the head was obtained and post processed in a separate workstation with concurrent attending physician supervision. Selected images were sent to PACS. No evidence of hemorrhagic complication. Delayed magnified frontal and lateral views of the head centered on the recently deployed left A2/ACA stent. No in stent clot formation seen. Preserved anterograde flow. The catheter was subsequently withdrawn. Right common femoral artery angiograms were obtained in frontal and lateral views. The puncture is at the level of the common femoral artery which has adequate caliber for closure device. The femoral sheath was exchanged over the wire for a Perclose prostyle which was utilized for access closure. Immediate hemostasis was achieved. IMPRESSION: 1. Successful mechanical thrombectomy with 1 pass direct contact aspiration for treatment of  a left A2/ACA occlusion. 2. Procedure was complicated by catheter related dissection treated with stenting with preservation of the anterograde flow. PLAN: Dual anti-platelet therapy for 6 months with evaluation of stent patency at that time. Electronically Signed   By: Pedro Earls M.D.   On: 10/02/2021 13:03   CT HEAD CODE STROKE WO CONTRAST  Result Date: 10/01/2021 CLINICAL DATA:  Code stroke. Neuro deficit, acute, stroke suspected. EXAM: CT HEAD WITHOUT CONTRAST TECHNIQUE: Contiguous axial images were obtained from the base of the skull through the vertex without intravenous contrast. COMPARISON:  None. FINDINGS: Brain: No focal abnormality affects the brainstem or cerebellum. There is loss of gray-white differentiation in the left frontal lobe suggesting left anterior cerebral artery territory infarction. There is an old small vessel infarction in the left basal ganglia. No mass, hemorrhage, hydrocephalus or extra-axial collection. Vascular: There is atherosclerotic calcification of the major vessels at the base of the brain. Skull: Negative Sinuses/Orbits: Clear/normal Other: None ASPECTS ([place] Stroke Program Early CT Score) - Ganglionic level infarction (caudate, lentiform nuclei, internal capsule, insula, M1-M3 cortex): 7 - Supraganglionic infarction (M4-M6 cortex): 3 Total score (0-10 with 10 being normal): 10 IMPRESSION: 1. 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 Loss of gray-white differentiation. No hemorrhage. 2. ASPECTS is 10 3. These results were communicated to Dr. Leonel Ramsay at 12:02 pm on 10/01/2021 by text page via the [facility] messaging system. Electronically Signed   By: Nelson Chimes M.D.   On: 10/01/2021 12:03   CT ANGIO HEAD NECK W WO CM W PERF (CODE STROKE)  Result Date: 10/01/2021 CLINICAL DATA:  Code stroke EXAM: CT ANGIOGRAPHY HEAD AND NECK CT PERFUSION BRAIN TECHNIQUE: Multidetector CT imaging of the head and neck was  performed using the standard protocol during bolus administration of intravenous contrast. Multiplanar CT image reconstructions and MIPs were obtained to evaluate the vascular anatomy. Carotid stenosis measurements (when applicable) are obtained utilizing NASCET criteria, using the distal internal carotid diameter as the denominator. Multiphase CT imaging of the brain was performed following IV bolus contrast injection. Subsequent parametric perfusion maps were calculated using RAPID software. CONTRAST:  140m OMNIPAQUE IOHEXOL 350 MG/ML SOLN COMPARISON:  Same-day noncontrast CT head FINDINGS: CTA NECK FINDINGS Aortic arch: There is minimal calcified atherosclerotic plaque of the aortic arch. Right carotid system: The right common carotid artery has a medialized course. There is minimal calcified atherosclerotic plaque in the proximal right internal carotid artery without hemodynamically significant stenosis or occlusion. There is no dissection or aneurysm. Left carotid system: The left common carotid artery has a medialized course. There is mild calcified atherosclerotic  plaque in the left carotid bulb without hemodynamically significant stenosis or occlusion. There is no dissection or aneurysm. Vertebral arteries: The left vertebral artery is dominant, a normal variant. The vertebral arteries are patent in the neck, with no hemodynamically significant stenosis, occlusion, dissection, or aneurysm. Skeleton: There is multilevel degenerative change of the cervical spine. There is no acute osseous abnormality or aggressive osseous lesion. Other neck: There is complete opacification of the left mastoid air cells and middle ear cavity. There is no definite evidence of ossicular erosion. No definite nasopharyngeal mass is identified. The soft tissues are unremarkable. Upper chest: There is scarring in the left lung apex. Review of the MIP images confirms the above findings CTA HEAD FINDINGS Anterior circulation: There is  mild calcified atherosclerotic plaque in the bilateral intracranial ICAs without hemodynamically significant stenosis. The bilateral MCAs are patent. There is mild irregularity on the left likely reflecting atherosclerotic disease without hemodynamically significant stenosis. The proximal ACAs are patent. There is focal occlusion of the left A2 segment (9-94, 8-71). There is reconstitution of flow distally. The right ACA is patent with moderate focal stenosis in the proximal A2 segment (8-81). There is no aneurysm. Posterior circulation: The right V4 segment is diminutive with a PICA termination. The left V4 segment is patent. The basilar artery is patent. The bilateral PCAs are patent. The right PCA is predominantly fed by a prominent right posterior communicating artery. The left posterior communicating artery is not definitely identified. There is no aneurysm. Venous sinuses: As permitted by contrast timing, patent. Anatomic variants: As above. Review of the MIP images confirms the above findings CT Brain Perfusion Findings: ASPECTS: 10 CBF (<30%) Volume: 90m Perfusion (Tmax>6.0s) volume: 261mMismatch Volume: 273mnfarction Location:No infarct identified. Ischemia is identified in the left ACA territory. IMPRESSION: 1. Focal occlusion of the left A2 segment with distal reconstitution. 2. 27 cc penumbra in the left ACA distribution. No core infarction identified. 3. Moderate focal stenosis of the proximal right A2 segment. Otherwise, no hemodynamically significant stenosis in the vasculature of the head or neck. 4. Complete opacification of the left mastoid air cells and middle ear cavity. No definite obstructing nasopharyngeal mass is seen. These results were called by telephone at the time of interpretation on 10/01/2021 at 12:30 pm to provider MCNEILL KIRRecovery Innovations - Recovery Response Centerwho verbally acknowledged these results. Electronically Signed   By: PetValetta MoleD.   On: 10/01/2021 12:32       HISTORY OF PRESENT  ILLNESS [age]ar old female with history of HLD admitted for right-sided weakness, aphasia.  CT showed left ACA anterior corpus callosum infarct.  CTA head and neck left A2 occlusion, CTP 0 core with 27 cc penumbra more at posterior corpus callosum.  MRI showed left ACA infarct more at anterior corpus callosum, therefore there was tissue to say at left ACA territory.  Status post IR with TICI3 revascularization with ACA stenting.  MRI post EVT showed no significant additional infarct at the left ACA territory.  MRI showed left A2 stent patent.  Patient remains hemodynamically stable.   HOSPITAL COURSE Admitted 10/01/2021 as a stat stroke and an ACA stenting in IR with TICI3 revascularization the same day. Physical and occupation therapy recommend acute inpatient rehabilitation as patient has needed assistance with ambulation and overall mobility.   DISCHARGE EXAM Blood pressure (!) 147/92, pulse 65, temperature 98.6 F (37 C), temperature source Oral, resp. rate 20, height '5\' 3"'  (1.6 m), weight 73.3 kg, SpO2 95 %.   Physical Exam  Constitutional:  Appears well-developed and well-nourished.  Psych: Affect appropriate to situation Eyes: No scleral injection HENT: No OP obstrucion MSK: no joint deformities.  Cardiovascular: Normal rate and regular rhythm.  Respiratory: Effort normal, non-labored breathing GI: Soft.  No distension. There is no tenderness.  Skin: WDI  Neuro: Mental Status: Patient is awake, alert, oriented to person, place, month, year, and situation. Patient is able to give a clear and coherent history. No signs of aphasia or neglect Cranial Nerves: II: Visual Fields are full. Pupils are equal, round, and reactive to light.   III,IV, VI: EOMI without ptosis or diploplia.  V: Facial sensation is symmetric to temperature VII: Slight right nasolabial fold flattening VIII: Hearing is intact to voice X: Palate elevates symmetrically XI: Shoulder shrug is symmetric. XII:  Tongue protrudes midline without atrophy or fasciculations.  Motor: 5/5 strength to all muscle groups tested. Right side is slightly weaker than left, right hand tremor noted Sensory: Sensation is symmetric to light touch and temperature in the arms and legs. No extinction to DSS present.  Deep Tendon Reflexes: 2+ and symmetric in the biceps and patellae.  Plantars: Toes are downgoing bilaterally.  Cerebellar: FNF and HKS are intact bilaterally  Discharge Diet      Diet   Diet Heart Room service appropriate? Yes with Assist; Fluid consistency: Thin   liquids  DISCHARGE PLAN Disposition:  Transfer to [facility] for ongoing PT, OT and ST aspirin 81 mg daily and Brilinta (ticagrelor) 90 mg bid for secondary stroke prevention for 6 months then aspirin alone. Recommend ongoing stroke risk factor control by Primary Care Physician at time of discharge from inpatient rehabilitation. Follow-up in [facility] in 8 weeks following discharge from rehab, call office to schedule an appointment.  Follow up with [facility] for a primary care appointment in 2 weeks following discharge from rehab, call office to schedule appointment  35 minutes were spent preparing discharge.  Janine Ores FNP-BC [facility]  I have personally obtained history,examined this patient, reviewed notes, independently viewed imaging studies, participated in medical decision making and plan of care.ROS completed by me personally and pertinent positives fully documented  I have made any additions or clarifications directly to the above note. Agree with note above.    Antony Contras, MD Medical Director [facility] Pager: [phone] 10/07/2021 3:25 PM

## 2021-10-07 NOTE — Progress Notes (Signed)
Physical Therapy Treatment Patient Details Name: Andrea Williamson MRN: [id number] DOB: 12-25-1950 Today's Date: 10/07/2021   History of Present Illness 70 y/o female presented to ED on 11/17 after a fall with R LE weakness and aphasia. 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 S/p mechanical thrombectomy of L ACA. PMH: HLD.    PT Comments    Pt progressing well with mobility, tolerating short hallway distance gait with use of RW as well as mod steadying assist and facilitation of RLE especially when fatigued. Pt with improved upright posture during mobility, min tendency towards R lateral leaning. Pt's husband present during session and very supportive of pt. PT to continue to follow, remains a great CIR candidate.    Recommendations for follow up therapy are one component of a multi-disciplinary discharge planning process, led by the attending physician.  Recommendations may be updated based on patient status, additional functional criteria and insurance authorization.  Follow Up Recommendations  Acute inpatient rehab (3hours/day)     Assistance Recommended at Discharge Frequent or constant Supervision/Assistance  Equipment Recommendations  Rolling walker (2 wheels);BSC/3in1;Wheelchair (measurements PT);Wheelchair cushion (measurements PT)    Recommendations for Other Services       Precautions / Restrictions Precautions Precautions: Fall;Other (comment) Precaution Comments: watch HR - periods of tachycardia Restrictions Weight Bearing Restrictions: No     Mobility  Bed Mobility Overal bed mobility: Needs Assistance Bed Mobility: Supine to Sit;Sit to Supine     Supine to sit: Min assist     General bed mobility comments: assist for trunk and LE progression to EOB    Transfers Overall transfer level: Needs assistance Equipment used: 2 person hand held assist Transfers: Sit to/from Stand Sit to Stand: Min  assist           General transfer comment: min assist for power up, steadying upon standing. cues for hand placement when rising/sitting.    Ambulation/Gait Ambulation/Gait assistance: Mod assist;+2 safety/equipment Gait Distance (Feet): 70 Feet Assistive device: 2 person hand held assist;Rolling walker (2 wheels) Gait Pattern/deviations: Step-through pattern;Decreased stride length;Trunk flexed;Drifts right/left;Decreased step length - right Gait velocity: decr     General Gait Details: Initially starting gait training with RW, requiring min assist to steady, guide RW, intermittent facilitation of RLE with max cues for big step on RLE. Pt requiring increased assist up to mod for RLE progression with fatigue, and + buckle with fatigue. Transitioned pt to RUE over PT shoulders for steadying and RLE facilitation at end of gait.   Stairs             Wheelchair Mobility    Modified Rankin (Stroke Patients Only) Modified Rankin (Stroke Patients Only) Pre-Morbid Rankin Score: No symptoms Modified Rankin: Moderately severe disability     Balance Overall balance assessment: Needs assistance   Sitting balance-Leahy Scale: Fair   Postural control: Right lateral lean;Posterior lean Standing balance support: Bilateral upper extremity supported;During functional activity Standing balance-Leahy Scale: Poor Standing balance comment: reliant on external assist                            Cognition Arousal/Alertness: Awake/alert Behavior During Therapy: WFL for tasks assessed/performed Overall Cognitive Status: Impaired/Different from baseline Area of Impairment: Attention;Following commands;Safety/judgement;Problem solving;Awareness;Memory                   Current Attention Level: Selective Memory: Decreased short-term memory Following Commands:  Follows one step commands with increased time;Follows multi-step commands inconsistently Safety/Judgement:  Decreased awareness of safety;Decreased awareness of deficits Awareness: Emergent Problem Solving: Difficulty sequencing;Requires verbal cues General Comments: nearly step-by-step cuing during gait for sequencing        Exercises      General Comments        Pertinent Vitals/Pain Pain Assessment: No/denies pain    Home Living                          Prior Function            PT Goals (current goals can now be found in the care plan section) Acute Rehab PT Goals Patient Stated Goal: to walk PT Goal Formulation: With patient Time For Goal Achievement: 10/16/21 Potential to Achieve Goals: Good Progress towards PT goals: Progressing toward goals    Frequency    Min 4X/week      PT Plan Current plan remains appropriate    Co-evaluation              AM-PAC PT "6 Clicks" Mobility   Outcome Measure  Help needed turning from your back to your side while in a flat bed without using bedrails?: A Little Help needed moving from lying on your back to sitting on the side of a flat bed without using bedrails?: A Little Help needed moving to and from a bed to a chair (including a wheelchair)?: A Lot Help needed standing up from a chair using your arms (e.g., wheelchair or bedside chair)?: A Lot Help needed to walk in hospital room?: A Lot Help needed climbing 3-5 steps with a railing? : Total 6 Click Score: 13    End of Session Equipment Utilized During Treatment: Gait belt Activity Tolerance: Patient tolerated treatment well Patient left: with call bell/phone within reach;in chair;with chair alarm set;with family/visitor present Nurse Communication: Mobility status PT Visit Diagnosis: Unsteadiness on feet (R26.81);Other abnormalities of gait and mobility (R26.89);Muscle weakness (generalized) (M62.81);History of falling (Z91.81);Difficulty in walking, not elsewhere classified (R26.2);Other symptoms and signs involving the nervous system (R29.898)      Time: 7948-0165 PT Time Calculation (min) (ACUTE ONLY): 23 min  Charges:  $Gait Training: 8-22 mins                    Stacie Glaze, PT DPT Acute Rehabilitation Services Pager [phone]  Office [phone]    [place] 10/07/2021, 1:46 PM

## 2021-10-07 NOTE — H&P (Signed)
Physical Medicine and Rehabilitation Admission H&P        Chief Complaint  Patient presents with   Functional deficits      HPI:  Andrea Williamson is a 70 year old female with lack of medical and in good health who was admitted on 10/01/21 with reports of RLE weakness X 2 days followed by fall due to RLE instability and noted to have difficulty speaking. UDS negative. 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 She continued to wax and wane in ED and CTA head/neck done revealing focal occlusion of Left A2 segment, moderate focal stenosis of proximal R-A2 segment and 27 cc penumbra in ACA distribution without core infarct. FShe underwent cerebral angio revealing occlusion of L-A2/ACA and underwent thrombectomy with revascularization and Left A2 stenting by Dr. Romie Jumper. Follow up MRI brain done revealing L-ACA and ACA/PCA watershed territory infarct with one new area of diffusion in right parietal lobe and small amount of petechial  hemorrhage. Dr. 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 Patient on ASA/Brilinta and Lipitor added for dyslipidemia with LDL-232. 2D echo done revealing EF 65-70% with mild concentric LVH and no wall abnormality. Apasia has resolved but she continues to have difficulty with higher level cognitive tasks, RLE weakness with balance deficits and  RLE weakness decrease in fine motor coordination. CIR recommended due to functional decline.      Review of Systems  Constitutional:  Negative for chills and fever.  HENT:  Positive for hearing loss.   Eyes:  Negative for blurred vision and double vision.  Respiratory:  Positive for shortness of breath. Negative for cough.   Cardiovascular:  Negative for chest pain and leg swelling.  Gastrointestinal:  Negative for abdominal pain and constipation.  Genitourinary:  Negative for dysuria and urgency.  Musculoskeletal:  Negative for back pain,  joint pain and myalgias.  Skin:  Negative for rash.  Neurological:  Positive for tremors, focal weakness and weakness. Negative for dizziness and headaches.  Psychiatric/Behavioral:  The patient is not nervous/anxious and does not have insomnia.       History reviewed. No pertinent past medical history.          Past Surgical History:  Procedure Laterality Date   IR CT HEAD LTD   10/05/2021   IR INTRA CRAN STENT   10/01/2021   IR PERCUTANEOUS ART THROMBECTOMY/INFUSION INTRACRANIAL INC DIAG ANGIO   10/01/2021   IR US GUIDE VASC ACCESS RIGHT   10/01/2021   RADIOLOGY WITH ANESTHESIA N/A 10/01/2021    Procedure: IR WITH ANESTHESIA;  Surgeon: Radiologist, Medication, MD;  Location: [facility];  Service: Radiology;  Laterality: N/A;      Family History: Parents and all siblings are deceased. Patient unaware of any illness or cause of death.      Social History: Married. Used to work for the railroad. Retired and independent PTA. She smokes 1/2 PPD. She denies use of alcohol or illicit drugs.      Allergies: No Known Allergies           Medications Prior to Admission  Medication Sig Dispense Refill   Aspirin-Acetaminophen-Caffeine (EXCEDRIN EXTRA STRENGTH PO) Take 1-2 tablets by mouth as needed (headache).          Drug Regimen Review  Drug regimen was reviewed and remains appropriate with no significant issues identified   Home: Home Living Family/patient expects to be discharged to::  Private residence Living Arrangements: Spouse/significant other Available Help at Discharge: Family, Available 24 hours/day Type of Home: House Home Access: Stairs to enter [employer] of Steps: 2 Entrance Stairs-Rails: Can reach both Home Layout: One level Bathroom Shower/Tub: Optometrist: Yes Home Equipment: None  Lives With: Spouse   Functional History: Prior Function Prior Level of Function : Independent/Modified  Independent, Driving Mobility Comments: enjoys bowling   Functional Status:  Mobility: Bed Mobility Overal bed mobility: Needs Assistance Bed Mobility: Supine to Sit, Sit to Supine Supine to sit: Min assist Sit to supine: Min assist General bed mobility comments: assist for trunk and LE progression to EOB Transfers Overall transfer level: Needs assistance Equipment used: 2 person hand held assist Transfers: Sit to/from Stand Sit to Stand: Min assist Bed to/from chair/wheelchair/BSC transfer type:: Step pivot Step pivot transfers: Mod assist, +2 physical assistance General transfer comment: min assist for power up, steadying upon standing. cues for hand placement when rising/sitting. Ambulation/Gait Ambulation/Gait assistance: Mod assist, +2 safety/equipment Gait Distance (Feet): 70 Feet Assistive device: 2 person hand held assist, Rolling walker (2 wheels) Gait Pattern/deviations: Step-through pattern, Decreased stride length, Trunk flexed, Drifts right/left, Decreased step length - right General Gait Details: Initially starting gait training with RW, requiring min assist to steady, guide RW, intermittent facilitation of RLE with max cues for big step on RLE. Pt requiring increased assist up to mod for RLE progression with fatigue, and + buckle with fatigue. Transitioned pt to RUE over PT shoulders for steadying and RLE facilitation at end of gait. Gait velocity: decr Pre-gait activities: forward and backward stepping RLE x5, standing marches x2 bilat   ADL: ADL Overall ADL's : Needs assistance/impaired Grooming: Wash/dry hands, Wash/dry face, Minimal assistance, Bed level Upper Body Bathing: Moderate assistance, Bed level Lower Body Bathing: Moderate assistance, Sit to/from stand Upper Body Dressing : Minimal assistance Lower Body Dressing: Maximal assistance Toilet Transfer: Moderate assistance, +2 for safety/equipment Toilet Transfer Details (indicate cue type and reason):  simulated Toileting- Clothing Manipulation and Hygiene: Total assistance Toileting - Clothing Manipulation Details (indicate cue type and reason): incontinenet; would benefit from toileting program Functional mobility during ADLs: Moderate assistance, +2 for physical assistance General ADL Comments: no acitive contraction noted to R LE, difficulty maintaining midline stting - R lean bias with cues to correct R lean, using L arm to hold onto armrest of the recliner   Cognition: Cognition Overall Cognitive Status: Impaired/Different from baseline Arousal/Alertness: Awake/alert Orientation Level: Oriented X4 Year: 2022 Attention: Sustained Sustained Attention: Appears intact Memory: Impaired Memory Impairment: Retrieval deficit, Storage deficit Awareness: Impaired Awareness Impairment: Anticipatory impairment Problem Solving:  (will assess for higher level) Safety/Judgment: Impaired Cognition Arousal/Alertness: Awake/alert Behavior During Therapy: WFL for tasks assessed/performed Overall Cognitive Status: Impaired/Different from baseline Area of Impairment: Attention, Following commands, Safety/judgement, Problem solving, Awareness, Memory Current Attention Level: Selective Memory: Decreased short-term memory Following Commands: Follows one step commands with increased time, Follows multi-step commands inconsistently Safety/Judgement: Decreased awareness of safety, Decreased awareness of deficits Awareness: Emergent Problem Solving: Difficulty sequencing, Requires verbal cues General Comments: nearly step-by-step cuing during gait for sequencing: Blood pressure (!) 147/92, pulse 65, temperature 98.6 F (37 C), temperature source Oral, resp. rate 20, height 5\' 3"  (1.6 m), weight 73.3 kg, SpO2 95 %.     Physical Exam Vitals and nursing note reviewed.  Constitutional:      Appearance: Normal appearance.  Neurological:     Mental Status: She is alert and oriented to person,  place,  and time.     Comments: Intentional tremors BUE. Ataxia with right FTN. Able to follow simple motor commands without difficulty.    General: No acute distress Mood and affect are appropriate Heart: Regular rate and rhythm no rubs murmurs or extra sounds Lungs: Clear to auscultation, breathing unlabored, no rales or wheezes Abdomen: Positive bowel sounds, soft nontender to palpation, nondistended Extremities: No clubbing, cyanosis, or edema Skin: No evidence of breakdown, no evidence of rash Neurologic: Cranial nerves II through XII intact, motor strength is 5/5 in bilateral deltoid, bicep, tricep, grip, 3/5 RIgh tand 5/5 Left hip flexor, knee extensors, ankle dorsiflexor and plantar flexor Sensory exam normal sensation to light touch and proprioception in bilateral upper and lower extremities Cerebellar exam mild bilateral intention tremor with  finger to nose to finger Musculoskeletal: Full range of motion in all 4 extremities. No joint swelling    Lab Results Last 48 Hours        Results for orders placed or performed during the hospital encounter of 10/01/21 (from the past 48 hour(s))  CBC     Status: None    Collection Time: 10/06/21  3:34 AM  Result Value Ref Range    WBC 8.3 4.0 - 10.5 K/uL    RBC 4.20 3.87 - 5.11 MIL/uL    Hemoglobin 13.9 12.0 - 15.0 g/dL    HCT 39.9 36.0 - 46.0 %    MCV 95.0 80.0 - 100.0 fL    MCH 33.1 26.0 - 34.0 pg    MCHC 34.8 30.0 - 36.0 g/dL    RDW 12.7 11.5 - 15.5 %    Platelets 241 150 - 400 K/uL    nRBC 0.0 0.0 - 0.2 %      Comment: Performed at [facility], [place] [address], [place], [place] [address]  Basic metabolic panel     Status: Abnormal    Collection Time: 10/06/21  3:34 AM  Result Value Ref Range    Sodium 137 135 - 145 mmol/L    Potassium 3.7 3.5 - 5.1 mmol/L    Chloride 106 98 - 111 mmol/L    CO2 23 22 - 32 mmol/L    Glucose, Bld 116 (H) 70 - 99 mg/dL      Comment: Glucose reference range applies only to samples taken after  fasting for at least 8 hours.    BUN 28 (H) 8 - 23 mg/dL    Creatinine, Ser 0.82 0.44 - 1.00 mg/dL    Calcium 9.5 8.9 - 10.3 mg/dL    GFR, Estimated >60 >60 mL/min      Comment: (NOTE) Calculated using the CKD-EPI Creatinine Equation (2021)      Anion gap 8 5 - 15      Comment: Performed at [facility] [address], [place], [place] [address]      Imaging Results (Last 48 hours)  No results found.           Medical Problem List and Plan: 1. 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  Antithrombotics: -DVT/anticoagulation:  Pharmaceutical: Lovenox             -antiplatelet therapy: ASA/Brilinta.  3. Pain Management: Tylenol prn.  4. Mood: LCSW to follow for evaluation and support.              -  antipsychotic agents: N/A 5. Neuropsych: This patient is capable of making decisions on her own behalf. 6. Skin/Wound Care: Routine pressure relief measures.  7. Fluids/Electrolytes/Nutrition: Monitor I/O. Check CMET in am. 8. Dyslipidemia: ON Lipitor daily.' 9. Constipation: Resolved and on Senna S  10. Tobacco abuse: Continue nicotine patch.      Bary Leriche, PA-C 10/07/2021         "I have personally performed a face to face diagnostic evaluation of this patient.  Additionally, I have reviewed and concur with the physician assistant's documentation above." Charlett Blake M.D. [employer] Fellow Am Acad of Phys Med and Rehab Diplomate Am Board of Electrodiagnostic Med Fellow Am Board of Interventional Pain

## 2021-10-07 NOTE — TOC Transition Note (Signed)
Transition of Care (TOC) - CM/SW Discharge Note Marvetta Gibbons RN,BSN Transitions of Care Unit 4NP (Non Trauma)- RN Case Manager See Treatment Team for direct Phone #   Patient Details  Name: Andrea Williamson MRN: [id number] Date of Birth: 03-May-1951  Transition of Care [facility]) CM/SW Contact:  Dawayne Patricia, RN Phone Number: 10/07/2021, 2:53 PM   Clinical Narrative:    Patient stable for transition to [facility] INPT rehab, bed availability has been confirmed and pt has insurance auth for INPT rehab    Final next level of care: IP Rehab Facility Barriers to Discharge: No Barriers Identified   Patient Goals and CMS Choice Patient states their goals for this hospitalization and ongoing recovery are:: rehab   Choice offered to / list presented to : Patient, Adult Children  Discharge Placement               [facility]- INPT rehab        Discharge Plan and Services     Post Acute Care Choice: IP Rehab                               Social Determinants of Health (SDOH) Interventions     Readmission Risk Interventions No flowsheet data found.

## 2021-10-07 NOTE — Progress Notes (Signed)
Inpatient Rehabilitation Admission Medication Review by a Pharmacist  A complete drug regimen review was completed for this patient to identify any potential clinically significant medication issues.  High Risk Drug Classes Is patient taking? Indication by Medication  Antipsychotic Yes Compazine for N/V  Anticoagulant Yes Lovenox for VTE ppx  Antibiotic No   Opioid No   Antiplatelet Yes Brilinta, Aspirin for CVA  Hypoglycemics/insulin No   Vasoactive Medication Yes Amlodipine for BP  Chemotherapy No   Other No      Type of Medication Issue Identified Description of Issue Recommendation(s)  Drug Interaction(s) (clinically significant)     Duplicate Therapy     Allergy     No Medication Administration End Date     Incorrect Dose     Additional Drug Therapy Needed     Significant med changes from prior encounter (inform family/care partners about these prior to discharge).    Other       Clinically significant medication issues were identified that warrant physician communication and completion of prescribed/recommended actions by midnight of the next day:  No  Pharmacist comments: None  Time spent performing this drug regimen review (minutes):  20 minutes   Tad Moore 10/07/2021 3:49 PM

## 2021-10-07 NOTE — Care Management Important Message (Signed)
Important Message  Patient Details  Name: Andrea Williamson MRN: [id number] Date of Birth: January 23, 1951   Medicare Important Message Given:  Yes     Joetta Manners 10/07/2021, 3:02 PM

## 2021-10-07 NOTE — Progress Notes (Signed)
PMR Admission Coordinator Pre-Admission Assessment   Patient: Andrea Williamson is an 70 y.o., female MRN: [id number] DOB: Apr 10, 1951 Height: _0  (160 cm) Weight: 73.3 kg   Insurance Information HMO: yes    PPO:      PCP:      IPA:      80/20:      OTHER:  PRIMARY: [employer] Medicare      Policy#: [id number]      Subscriber: patient CM Name: N/a      Phone#: [phone]     Fax#: [phone] I received a call from Manati at [facility] granting approval on 10/06/21 for admission for 7 days with updates due 63/78/58 Pre-Cert#: [id number]      Employer:  Benefits:  Phone #: [email or web address]     Name:  Eff. Date: 11/15/20-11/14/21     Deduct: does not have deductible      Out of Pocket Max: $4,500 ($0 met)      Life Max: NA CIR: $325/day co-pay for days 1-5, $0/day co-pay for days 6+      SNF: $0/day co-pay for days 1-20, $188/day co-pay for days 21-44, $0/day co-pay for days 45-100 Outpatient: $30/visit co-pay     Co-Pay:  Home Health: 100%       Co-Pay:  DME: 80%     Co-Pay: 20% co-insurance Providers: in-network SECONDARY:       Policy#:      Phone#:    Development worker, community:       Phone#:    The Engineer, petroleum" for patients in Inpatient Rehabilitation Facilities with attached "Privacy Act [place] Records" was provided and verbally reviewed with: Patient   Emergency Contact Information Contact Information       Name Relation Home Work Hendron Son     [phone]    Gumbs,[name] Daughter     [phone]           Current Medical History  Patient Admitting Diagnosis: L CVA History of Present Illness: Pt is a 70 year old female with medical hx significant for: HLD. Pt presented to hospital on 10/01/21 with right leg weakness and aphasia. On 11/16, pt fell while bowling but did not hit her head. Husband found her on the floor day of hospital presentation. Code Stroke initiated in ED. Pt was waxing/waning in ED and a large penumbra was  noted on CTP, therefore it was decided to take pt for thrombectomy. CT head showed left ACA infarct. MRI confirmed findings. Therapy evaluations completed and CIR recommended d/t pt's deficits in functional mobility and inability to complete ADLs independently.  Complete NIHSS TOTAL: 1   Patient's medical record from [facility] has been reviewed by the rehabilitation admission coordinator and physician.   Past Medical History  No past medical history on file.   Has the patient had major surgery during 100 days prior to admission? Yes   Family History   family history is not on file.   Current Medications   Current Facility-Administered Medications:     stroke: mapping our early stages of recovery book, , Does not apply, Once, Greta Doom, MD   acetaminophen (TYLENOL) tablet 650 mg, 650 mg, Oral, Q4H PRN **OR** acetaminophen (TYLENOL) 160 MG/5ML solution 650 mg, 650 mg, Per Tube, Q4H PRN **OR** acetaminophen (TYLENOL) suppository 650 mg, 650 mg, Rectal, Q4H PRN, Greta Doom, MD   amLODipine (NORVASC) tablet 5 mg, 5 mg, Oral, Daily, Leonie Man, Lucy Antigua, MD, 5 mg  81 mg, 81 mg, Per Tube, Daily, de Macedo Rodrigues, Katyucia, MD, 81 mg at 10/05/21 0913   atorvastatin (LIPITOR) tablet 80 mg, 80 mg, Oral, Daily, Xu, Jindong, MD, 80 mg at 10/07/21 0926   bisacodyl (DULCOLAX) suppository 10 mg, 10 mg, Rectal, Daily PRN, Bailey-Modzik, Delila A, NP   enoxaparin (LOVENOX) injection 40 mg, 40 mg, Subcutaneous, Q24H, Xu, Jindong, MD, 40 mg at 10/06/21 2108   iohexol (OMNIPAQUE) 300 MG/ML solution 100 mL, 100 mL, Intra-arterial, Once PRN, de Macedo Rodrigues, Katyucia, MD   nicotine (NICODERM CQ - dosed in mg/24 hours) patch 21 mg, 21 mg, Transdermal, Daily, Xu, Jindong, MD, 21 mg at 10/07/21 0927   ondansetron (ZOFRAN) injection 4 mg, 4 mg, Intravenous, Q6H PRN, de Macedo  Rodrigues, Katyucia, MD   senna-docusate (Senokot-S) tablet 1 tablet, 1 tablet, Oral, Q0600, Sethi, Pramod S, MD   ticagrelor (BRILINTA) tablet 90 mg, 90 mg, Oral, BID, 90 mg at 10/07/21 0927 **OR** ticagrelor (BRILINTA) tablet 90 mg, 90 mg, Per Tube, BID, Kirkpatrick, McNeill P, MD  Patients Current Diet:  Diet Order             Diet Heart Room service appropriate? Yes with Assist; Fluid consistency: Thin  Diet effective now                   Precautions / Restrictions Precautions Precautions: Fall, Other (comment) Precaution Comments: watch HR - periods of tachycardia Restrictions Weight Bearing Restrictions: No   Has the patient had 2 or more falls or a fall with injury in the past year? Yes  Prior Activity Level Limited Community (1-2x/wk): drives, gets out of house a coupe times a week  Prior Functional Level Self Care: Did the patient need help bathing, dressing, using the toilet or eating? Independent  Indoor Mobility: Did the patient need assistance with walking from room to room (with or without device)? Independent  Stairs: Did the patient need assistance with internal or external stairs (with or without device)? Independent  Functional Cognition: Did the patient need help planning regular tasks such as shopping or remembering to take medications? Independent  Patient Information Are you of Hispanic, Latino/a,or Spanish origin?: A. No, not of Hispanic, Latino/a, or Spanish origin What is your race?: A. White Do you need or want an interpreter to communicate with a doctor or health care staff?: 0. No  Patient's Response To:  Health Literacy and Transportation Is the patient able to respond to health literacy and transportation needs?: Yes Health Literacy - How often do you need to have someone help you when you read instructions, pamphlets, or other written material from your doctor or pharmacy?: Never In the past 12 months, has lack of transportation kept you  from medical appointments or from getting medications?: No In the past 12 months, has lack of transportation kept you from meetings, work, or from getting things needed for daily living?: No  Home Assistive Devices / Equipment Home Equipment: None  Prior Device Use: Indicate devices/aids used by the patient prior to current illness, exacerbation or injury? None of the above  Current Functional Level Cognition  Arousal/Alertness: Awake/alert Overall Cognitive Status: Impaired/Different from baseline Current Attention Level: Sustained Orientation Level: Oriented X4 Following Commands: Follows one step commands with increased time, Follows multi-step commands inconsistently Safety/Judgement: Decreased awareness of safety, Decreased awareness of deficits General Comments: continued max cues for safety during mobility, especially R lateral leaning and sequencing gait Attention: Sustained Sustained Attention: Appears intact Memory: Impaired Memory Impairment:   Commands: Follows one step commands with increased time, Follows multi-step commands inconsistently Safety/Judgement: Decreased awareness of safety, Decreased awareness of deficits General Comments: continued max cues for safety during mobility, especially R lateral leaning and sequencing gait Attention: Sustained Sustained Attention: Appears intact Memory: Impaired Memory Impairment: Retrieval deficit, Storage deficit Awareness: Impaired Awareness Impairment: Anticipatory impairment Problem Solving:  (will assess for higher level) Safety/Judgment: Impaired    Extremity Assessment (includes Sensation/Coordination)   Upper Extremity Assessment: RUE deficits/detail RUE Deficits / Details: R hand dominant and requires cues to use during funcitonal activities; decreased fine motor/coordination RUE Sensation: WNL RUE Coordination: WNL  Lower Extremity Assessment: Defer to PT evaluation RLE Deficits / Details: Hip flexors 1/5 but otherwise 0/5 RLE Sensation: decreased light touch RLE Coordination: decreased fine motor, decreased gross motor     ADLs   Overall ADL's : Needs assistance/impaired Grooming: Wash/dry hands, Wash/dry face, Minimal assistance, Bed level Upper Body Bathing: Moderate assistance, Bed level Lower Body Bathing: Moderate assistance, Sit to/from  stand Upper Body Dressing : Minimal assistance Lower Body Dressing: Maximal assistance Toilet Transfer: Moderate assistance, +2 for safety/equipment Toilet Transfer Details (indicate cue type and reason): simulated Toileting- Clothing Manipulation and Hygiene: Total assistance Toileting - Clothing Manipulation Details (indicate cue type and reason): incontinenet; would benefit from toileting program Functional mobility during ADLs: Moderate assistance, +2 for physical assistance General ADL Comments: no acitive contraction noted to R LE, difficulty maintaining midline stting - R lean bias with cues to correct R lean, using L arm to hold onto armrest of the recliner     Mobility   Overal bed mobility: Needs Assistance Bed Mobility: Supine to Sit, Sit to Supine Supine to sit: Min assist Sit to supine: Min assist General bed mobility comments: assist for trunk and LE management, scooting to EOB via HHA. Increased time, requires sequencing cues.     Transfers   Overall transfer level: Needs assistance Equipment used: 2 person hand held assist Transfers: Sit to/from Stand Sit to Stand: Mod assist, +2 safety/equipment Bed to/from chair/wheelchair/BSC transfer type:: Step pivot Step pivot transfers: Mod assist, +2 physical assistance General transfer comment: mod assist for power up, rise, steadying, correcting R lateral bias. STS x2, from EOB and toilet.     Ambulation / Gait / Stairs / Wheelchair Mobility   Ambulation/Gait Ambulation/Gait assistance: Mod assist, +2 physical assistance Gait Distance (Feet): 15 Feet (x2 - to and from bathroom) Assistive device: 2 person hand held assist Gait Pattern/deviations: Step-through pattern, Decreased stride length, Trunk flexed, Drifts right/left, Decreased step length - right General Gait Details: Pt with RUE draped over PT shoulders, PT tech holding pt's L hand. Assist to steady, correct balance, progress RLE, step-by-step cuing for sequencing  gait and progressing RLE. Gait velocity: decr Pre-gait activities: forward and backward stepping RLE x5, standing marches x2 bilat     Posture / Balance Dynamic Sitting Balance Sitting balance - Comments: Initial right lateral lean but able to correct with min verbal cues Balance Overall balance assessment: Needs assistance Sitting-balance support: Single extremity supported, Feet supported Sitting balance-Leahy Scale: Fair Sitting balance - Comments: Initial right lateral lean but able to correct with min verbal cues Postural control: Right lateral lean, Posterior lean Standing balance support: Bilateral upper extremity supported, During functional activity Standing balance-Leahy Scale: Poor Standing balance comment: reliant on external assist     Special needs/care consideration Skin Catheter entry/exit: groin/right, External urinary catheter, Bladder incontinence    Previous Home Environment (from acute therapy documentation) Living Arrangements:  Spouse/significant other  Lives With: Spouse Available Help at Discharge: Family, Available 24 hours/day Type of Home: House Home Layout: One level Home Access: Stairs to enter Entrance Stairs-Rails: Can reach both Entrance Stairs-Number of Steps: 2 Bathroom Shower/Tub: Optometrist: Yes How Accessible: Accessible via walker [place]: No   Discharge Living Setting Plans for Discharge Living Setting: House (son's house) Type of Home at Discharge: House Discharge Home Layout: One level Discharge Home Access: Stairs to enter Entrance Stairs-Rails: Can reach both Entrance Stairs-Number of Steps: 1 Discharge Bathroom Shower/Tub: Walk-in shower Discharge Bathroom Toilet: Standard Discharge Bathroom Accessibility: Yes How Accessible: Accessible via walker Does the patient have any problems obtaining your medications?: No   Social/Family/Support Systems Anticipated  Caregiver: Kandis Nab, son and daughter-in-law Anticipated Caregiver's Contact Information: [phone] Caregiver Availability: 24/7 Discharge Plan Discussed with Primary Caregiver: Yes Is Caregiver In Agreement with Plan?: Yes Does Caregiver/Family have Issues with Lodging/Transportation while Pt is in Rehab?: No   Goals Patient/Family Goal for Rehab: Mod I-Supervision: PT/OT Expected length of stay: 16-18 days Pt/Family Agrees to Admission and willing to participate: Yes Program Orientation Provided & Reviewed with Pt/Caregiver Including Roles  & Responsibilities: Yes   Decrease burden of Care through IP rehab admission: NA   Possible need for SNF placement upon discharge: Not anticipated   Patient Condition: I have reviewed medical records from [facility], spoken with CM, and patient and son. I met with patient at the bedside and discussed via phone for inpatient rehabilitation assessment.  Patient will benefit from ongoing PT, OT, and SLP, can actively participate in 3 hours of therapy a day 5 days of the week, and can make measurable gains during the admission.  Patient will also benefit from the coordinated team approach during an Inpatient Acute Rehabilitation admission.  The patient will receive intensive therapy as well as Rehabilitation physician, nursing, social worker, and care management interventions.  Due to bladder management, safety, skin/wound care, disease management, medication administration, pain management, and patient education the patient requires 24 hour a day rehabilitation nursing.  The patient is currently mod A+2 with mobility and basic ADLs.  Discharge setting and therapy post discharge at home with home health is anticipated.  Patient has agreed to participate in the Acute Inpatient Rehabilitation Program and will admit today.   Preadmission Screen Completed By:  Bethel Born, 10/07/2021 11:22 AM  with updates by Clemens Catholic, MS, CCC-SLP    ______________________________________________________________________   Discussed status with Dr. Letta Pate  on 10/07/21 at 1000 and received approval for admission today.   Admission Coordinator:  Bethel Born, CCC-SLP, time 1120/Date 10/07/21    Assessment/Plan: Diagnosis:Left ACA infarct Does the need for close, 24 hr/day Medical supervision in concert with the patient's rehab needs make it unreasonable for this patient to be served in a less intensive setting? 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 Yes Does the patient require coordinated care of a physician, rehab nurse, PT, OT, and SLP to address physical and functional deficits in the context of the above medical diagnosis(es)? Yes Addressing deficits in the following areas: balance, endurance, locomotion, strength, transferring, bowel/bladder control, bathing, dressing, feeding, grooming, toileting, cognition, language, and psychosocial support Can the patient actively participate in an intensive therapy program of at least 3 hrs  of therapy 5 days a week? Yes The potential for patient to make measurable gains while on inpatient rehab is good Anticipated functional outcomes upon discharge from inpatient rehab: supervision PT, supervision OT, supervision SLP Estimated rehab length of stay to reach the above functional goals is: 16-18d Anticipated discharge destination: Home 10. Overall Rehab/Functional Prognosis: good     MD Signature: Charlett Blake M.D. [facility] Fellow Am Acad of Phys Med and Rehab Diplomate Am Board of Electrodiagnostic Med Fellow Am Board of Interventional Pain

## 2021-10-07 NOTE — Progress Notes (Signed)
Inpatient Rehab Admissions Coordinator:   I received insurance auth and have a bed for this Pt. On Cir today. RN may call report to [place], [place], [place] Admissions Coordinator  [phone] ([place]) [phone] (office)

## 2021-10-07 NOTE — H&P (Shared)
Physical Medicine and Rehabilitation Admission H&P    Chief Complaint  Patient presents with   Functional deficits    HPI:  Andrea Williamson is a 70 year old female with lack of medical and in good health who was admitted on 10/01/21 with reports of RLE weakness X 2 days followed by fall due to RLE instability and noted to have difficulty speaking. UDS negative. 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 She continued to wax and wane in ED and CTA head/neck done revealing focal occlusion of Left A2 segment, moderate focal stenosis of proximal R-A2 segment and 27 cc penumbra in ACA distribution without core infarct. FShe underwent cerebral angio revealing occlusion of L-A2/ACA and underwent thrombectomy with revascularization and Left A2 stenting by Dr. Romie Jumper. Follow up MRI brain done revealing L-ACA and ACA/PCA watershed territory infarct with one new area of diffusion in right parietal lobe and small amount of petechial  hemorrhage. Dr. 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 Patient on ASA/Brilinta and Lipitor added for dyslipidemia with LDL-232. 2D echo done revealing EF 65-70% with mild concentric LVH and no wall abnormality. Apasia has resolved but she continues to have difficulty with higher level cognitive tasks, RLE weakness with balance deficits and  RLE weakness decrease in fine motor coordination. CIR recommended due to functional decline.    Review of Systems  Constitutional:  Negative for chills and fever.  HENT:  Positive for hearing loss.   Eyes:  Negative for blurred vision and double vision.  Respiratory:  Positive for shortness of breath. Negative for cough.   Cardiovascular:  Negative for chest pain and leg swelling.  Gastrointestinal:  Negative for abdominal pain and constipation.  Genitourinary:  Negative for dysuria and urgency.  Musculoskeletal:  Negative for back pain,  joint pain and myalgias.  Skin:  Negative for rash.  Neurological:  Positive for tremors, focal weakness and weakness. Negative for dizziness and headaches.  Psychiatric/Behavioral:  The patient is not nervous/anxious and does not have insomnia.     History reviewed. No pertinent past medical history.   Past Surgical History:  Procedure Laterality Date   IR CT HEAD LTD  10/05/2021   IR INTRA CRAN STENT  10/01/2021   IR PERCUTANEOUS ART THROMBECTOMY/INFUSION INTRACRANIAL INC DIAG ANGIO  10/01/2021   IR US GUIDE VASC ACCESS RIGHT  10/01/2021   RADIOLOGY WITH ANESTHESIA N/A 10/01/2021   Procedure: IR WITH ANESTHESIA;  Surgeon: Radiologist, Medication, MD;  Location: [place];  Service: Radiology;  Laterality: N/A;    Family History: Parents and all siblings are deceased. Patient unaware of any illness or cause of death.    Social History: Married. Used to work for the [employer]. Retired and independent PTA. She smokes 1/2 PPD. She denies use of alcohol or illicit drugs.    Allergies: No Known Allergies   Medications Prior to Admission  Medication Sig Dispense Refill   Aspirin-Acetaminophen-Caffeine (EXCEDRIN EXTRA STRENGTH PO) Take 1-2 tablets by mouth as needed (headache).      Drug Regimen Review { DRUG REGIMEN [id number]}  Home: Home Living Family/patient expects to be discharged to:: Private residence Living Arrangements: Spouse/significant other Available Help at Discharge: Family, Available 24 hours/day Type of Home: House Home Access: Stairs to enter [employer] of Steps: 2 Entrance Stairs-Rails: Can reach both Home Layout: One level Bathroom Shower/Tub: Chiropodist: Standard Bathroom Accessibility: Yes Home  Equipment: None  Lives With: Spouse   Functional History: Prior Function Prior Level of Function : Independent/Modified Independent, Driving Mobility Comments: enjoys bowling  Functional Status:  Mobility: Bed  Mobility Overal bed mobility: Needs Assistance Bed Mobility: Supine to Sit, Sit to Supine Supine to sit: Min assist Sit to supine: Min assist General bed mobility comments: assist for trunk and LE progression to EOB Transfers Overall transfer level: Needs assistance Equipment used: 2 person hand held assist Transfers: Sit to/from Stand Sit to Stand: Min assist Bed to/from chair/wheelchair/BSC transfer type:: Step pivot Step pivot transfers: Mod assist, +2 physical assistance General transfer comment: min assist for power up, steadying upon standing. cues for hand placement when rising/sitting. Ambulation/Gait Ambulation/Gait assistance: Mod assist, +2 safety/equipment Gait Distance (Feet): 70 Feet Assistive device: 2 person hand held assist, Rolling walker (2 wheels) Gait Pattern/deviations: Step-through pattern, Decreased stride length, Trunk flexed, Drifts right/left, Decreased step length - right General Gait Details: Initially starting gait training with RW, requiring min assist to steady, guide RW, intermittent facilitation of RLE with max cues for big step on RLE. Pt requiring increased assist up to mod for RLE progression with fatigue, and + buckle with fatigue. Transitioned pt to RUE over PT shoulders for steadying and RLE facilitation at end of gait. Gait velocity: decr Pre-gait activities: forward and backward stepping RLE x5, standing marches x2 bilat    ADL: ADL Overall ADL's : Needs assistance/impaired Grooming: Wash/dry hands, Wash/dry face, Minimal assistance, Bed level Upper Body Bathing: Moderate assistance, Bed level Lower Body Bathing: Moderate assistance, Sit to/from stand Upper Body Dressing : Minimal assistance Lower Body Dressing: Maximal assistance Toilet Transfer: Moderate assistance, +2 for safety/equipment Toilet Transfer Details (indicate cue type and reason): simulated Toileting- Clothing Manipulation and Hygiene: Total assistance Toileting - Clothing  Manipulation Details (indicate cue type and reason): incontinenet; would benefit from toileting program Functional mobility during ADLs: Moderate assistance, +2 for physical assistance General ADL Comments: no acitive contraction noted to R LE, difficulty maintaining midline stting - R lean bias with cues to correct R lean, using L arm to hold onto armrest of the recliner  Cognition: Cognition Overall Cognitive Status: Impaired/Different from baseline Arousal/Alertness: Awake/alert Orientation Level: Oriented X4 Year: 2022 Attention: Sustained Sustained Attention: Appears intact Memory: Impaired Memory Impairment: Retrieval deficit, Storage deficit Awareness: Impaired Awareness Impairment: Anticipatory impairment Problem Solving:  (will assess for higher level) Safety/Judgment: Impaired Cognition Arousal/Alertness: Awake/alert Behavior During Therapy: WFL for tasks assessed/performed Overall Cognitive Status: Impaired/Different from baseline Area of Impairment: Attention, Following commands, Safety/judgement, Problem solving, Awareness, Memory Current Attention Level: Selective Memory: Decreased short-term memory Following Commands: Follows one step commands with increased time, Follows multi-step commands inconsistently Safety/Judgement: Decreased awareness of safety, Decreased awareness of deficits Awareness: Emergent Problem Solving: Difficulty sequencing, Requires verbal cues General Comments: nearly step-by-step cuing during gait for sequencing: Blood pressure (!) 147/92, pulse 65, temperature 98.6 F (37 C), temperature source Oral, resp. rate 20, height 5\' 3"  (1.6 m), weight 73.3 kg, SpO2 95 %.   Physical Exam Vitals and nursing note reviewed.  Constitutional:      Appearance: Normal appearance.  Neurological:     Mental Status: She is alert and oriented to person, place, and time.     Comments: Intentional tremors BUE. Ataxia with right FTN. Able to follow simple motor  commands without difficulty.     Results for orders placed or performed during the hospital encounter of 10/01/21 (from the past 48 hour(s))  CBC  Status: None   Collection Time: 10/06/21  3:34 AM  Result Value Ref Range   WBC 8.3 4.0 - 10.5 K/uL   RBC 4.20 3.87 - 5.11 MIL/uL   Hemoglobin 13.9 12.0 - 15.0 g/dL   HCT 39.9 36.0 - 46.0 %   MCV 95.0 80.0 - 100.0 fL   MCH 33.1 26.0 - 34.0 pg   MCHC 34.8 30.0 - 36.0 g/dL   RDW 12.7 11.5 - 15.5 %   Platelets 241 150 - 400 K/uL   nRBC 0.0 0.0 - 0.2 %    Comment: Performed at [facility], [place] [address], [place], [place] [address]  Basic metabolic panel     Status: Abnormal   Collection Time: 10/06/21  3:34 AM  Result Value Ref Range   Sodium 137 135 - 145 mmol/L   Potassium 3.7 3.5 - 5.1 mmol/L   Chloride 106 98 - 111 mmol/L   CO2 23 22 - 32 mmol/L   Glucose, Bld 116 (H) 70 - 99 mg/dL    Comment: Glucose reference range applies only to samples taken after fasting for at least 8 hours.   BUN 28 (H) 8 - 23 mg/dL   Creatinine, Ser 0.82 0.44 - 1.00 mg/dL   Calcium 9.5 8.9 - 10.3 mg/dL   GFR, Estimated >60 >60 mL/min    Comment: (NOTE) Calculated using the CKD-EPI Creatinine Equation (2021)    Anion gap 8 5 - 15    Comment: Performed at [facility] [address], [place], [place] [address]   No results found.     Medical Problem List and Plan: 1. Functional deficits secondary to ***  -patient may *** shower  -ELOS/Goals: *** 2.  Antithrombotics: -DVT/anticoagulation:  Pharmaceutical: Lovenox  -antiplatelet therapy: ASA/Brilinta.  3. Pain Management: Tylenol prn.  4. Mood: LCSW to follow for evaluation and support.   -antipsychotic agents: N/A 5. Neuropsych: This patient is capable of making decisions on her own behalf. 6. Skin/Wound Care: Routine pressure relief measures.  7. Fluids/Electrolytes/Nutrition: Monitor I/O. Check CMET in am. 8. Dyslipidemia: ON Lipitor daily.' 9. Constipation: Resolved and on  Senna S  10. Tobacco abuse: Continue nicotine patch.     ***  Bary Leriche, PA-C 10/07/2021

## 2021-10-07 NOTE — Progress Notes (Signed)
Speech Language Pathology Treatment: Cognitive-Linquistic  Patient Details Name: Andrea Williamson MRN: [id number] DOB: December 25, 1950 Today's Date: 10/07/2021 Time: 2025-4270 SLP Time Calculation (min) (ACUTE ONLY): 12 min  Assessment / Plan / Recommendation Clinical Impression  Pt seen this am for skilled therapy focused on training in functional memory, with sister-in-law present at bedside. Educated/trained pt and family in categorization/chunking strategy as means of improving recall of listed information. When given a list of 3 words, pt grouped 2/3 in common category given min verbal cues. She recalled listed items with 100% accuracy immediately and after short delay (with auditory distractions) with 100% accuracy without cueing. Further educated regarding repetition and note taking strategies as additional ways to improve delayed recall of day to day information. Pt and family expressed understanding. Will continue f/u for training in more complex recall tasks and continued assessment of overall cognitive function.   HPI HPI: 70 y/o female presented to ED on 11/17 after a fall with R LE weakness and aphasia. 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 S/p mechanical thrombectomy of L ACA. PMH: HLD      SLP Plan  Continue with current plan of care      Recommendations for follow up therapy are one component of a multi-disciplinary discharge planning process, led by the attending physician.  Recommendations may be updated based on patient status, additional functional criteria and insurance authorization.    Recommendations                   Follow Up Recommendations: Acute inpatient rehab (3hours/day) Assistance recommended at discharge: Frequent or constant Supervision/Assistance SLP Visit Diagnosis: Cognitive communication deficit (W23.762) Plan: Continue with current plan of care       [place], [place], [place] Office Number: [phone]   Acie Fredrickson  10/07/2021, 9:47 AM

## 2021-10-07 NOTE — Progress Notes (Signed)
Pt arrived to unit in bed. Denies pain or discomfort. Oriented to floor and rehab policy. Pt currently resting in bed with all needs with in reach.  Gerald Stabs, RN

## 2021-10-08 DIAGNOSIS — I63522 Cerebral infarction due to unspecified occlusion or stenosis of left anterior cerebral artery: Secondary | ICD-10-CM | POA: Diagnosis not present

## 2021-10-08 LAB — COMPREHENSIVE METABOLIC PANEL
ALT: 49 U/L — ABNORMAL HIGH (ref 0–44)
AST: 31 U/L (ref 15–41)
Albumin: 3.5 g/dL (ref 3.5–5.0)
Alkaline Phosphatase: 69 U/L (ref 38–126)
Anion gap: 8 (ref 5–15)
BUN: 41 mg/dL — ABNORMAL HIGH (ref 8–23)
CO2: 21 mmol/L — ABNORMAL LOW (ref 22–32)
Calcium: 9.4 mg/dL (ref 8.9–10.3)
Chloride: 107 mmol/L (ref 98–111)
Creatinine, Ser: 0.9 mg/dL (ref 0.44–1.00)
GFR, Estimated: >60 mL/min (ref >60)
Glucose, Bld: 105 mg/dL — ABNORMAL HIGH (ref 70–99)
Potassium: 3.9 mmol/L (ref 3.5–5.1)
Sodium: 136 mmol/L (ref 135–145)
Total Bilirubin: 0.8 mg/dL (ref 0.3–1.2)
Total Protein: 6.7 g/dL (ref 6.5–8.1)

## 2021-10-08 LAB — CBC WITH DIFFERENTIAL/PLATELET
Abs Immature Granulocytes: 0.06 10*3/uL (ref 0.00–0.07)
Basophils Absolute: 0.1 10*3/uL (ref 0.0–0.1)
Basophils Relative: 1 %
Eosinophils Absolute: 0.4 10*3/uL (ref 0.0–0.5)
Eosinophils Relative: 5 %
HCT: 42 % (ref 36.0–46.0)
Hemoglobin: 14.1 g/dL (ref 12.0–15.0)
Immature Granulocytes: 1 %
Lymphocytes Relative: 35 %
Lymphs Abs: 2.7 10*3/uL (ref 0.7–4.0)
MCH: 32 pg (ref 26.0–34.0)
MCHC: 33.6 g/dL (ref 30.0–36.0)
MCV: 95.5 fL (ref 80.0–100.0)
Monocytes Absolute: 0.9 10*3/uL (ref 0.1–1.0)
Monocytes Relative: 11 %
Neutro Abs: 3.7 10*3/uL (ref 1.7–7.7)
Neutrophils Relative %: 47 %
Platelets: 259 10*3/uL (ref 150–400)
RBC: 4.4 MIL/uL (ref 3.87–5.11)
RDW: 12.6 % (ref 11.5–15.5)
WBC: 7.9 10*3/uL (ref 4.0–10.5)
nRBC: 0 % (ref 0.0–0.2)

## 2021-10-08 NOTE — Progress Notes (Signed)
PROGRESS NOTE   Subjective/Complaints:  Pt reports no issues- LBM 2 days ago, but denies constipation.  Denies pain.  Ate 100% of tray.   BUN 41 up from 28- will recheck in AM- and push fluids.  ROS:  Pt denies SOB, abd pain, CP, N/V/C/D, and vision changes   Objective:   No results found. Recent Labs    10/06/21 0334 10/08/21 0516  WBC 8.3 7.9  HGB 13.9 14.1  HCT 39.9 42.0  PLT 241 259   Recent Labs    10/06/21 0334 10/08/21 0516  NA 137 136  K 3.7 3.9  CL 106 107  CO2 23 21*  GLUCOSE 116* 105*  BUN 28* 41*  CREATININE 0.82 0.90  CALCIUM 9.5 9.4    Intake/Output Summary (Last 24 hours) at 10/08/2021 0818 Last data filed at 10/08/2021 0743 Gross per 24 hour  Intake 540 ml  Output --  Net 540 ml        Physical Exam: Vital Signs Blood pressure (!) 122/96, pulse 61, temperature 98.2 F (36.8 C), temperature source Oral, resp. rate 18, height 5\' 3"  (1.6 m), weight 72 kg, SpO2 97 %.   General: awake, alert, appropriate, sitting up in bed- finished breakfast; NAD HENT: conjugate gaze; oropharynx moist CV: regular rate; no JVD Pulmonary: CTA B/L; no W/R/R- good air movement GI: soft, NT, ND, (+)BS Psychiatric: appropriate Neurological: vague; alert  Skin: No evidence of breakdown, no evidence of rash Neurologic: Cranial nerves II through XII intact, motor strength is 5/5 in bilateral deltoid, bicep, tricep, grip, 3/5 RIgh tand 5/5 Left hip flexor, knee extensors, ankle dorsiflexor and plantar flexor Sensory exam normal sensation to light touch and proprioception in bilateral upper and lower extremities Cerebellar exam mild bilateral intention tremor with  finger to nose to finger Musculoskeletal: Full range of motion in all 4 extremities. No joint swelling Assessment/Plan: 1. Functional deficits which require 3+ hours per day of interdisciplinary therapy in a comprehensive inpatient rehab  setting. Physiatrist is providing close team supervision and 24 hour management of active medical problems listed below. Physiatrist and rehab team continue to assess barriers to discharge/monitor patient progress toward functional and medical goals  Care Tool:  Bathing              Bathing assist       Upper Body Dressing/Undressing Upper body dressing        Upper body assist      Lower Body Dressing/Undressing Lower body dressing            Lower body assist       Toileting Toileting    Toileting assist Assist for toileting: Moderate Assistance - Patient 50 - 74%     Transfers Chair/bed transfer  Transfers assist           Locomotion Ambulation   Ambulation assist              Walk 10 feet activity   Assist           Walk 50 feet activity   Assist           Walk 150 feet activity   Assist  Walk 10 feet on uneven surface  activity   Assist           Wheelchair     Assist               Wheelchair 50 feet with 2 turns activity    Assist            Wheelchair 150 feet activity     Assist          Blood pressure (!) 122/96, pulse 61, temperature 98.2 F (36.8 C), temperature source Oral, resp. rate 18, height 5\' 3"  (1.6 m), weight 72 kg, SpO2 97 %.  Medical Problem List and Plan: 1. Functional deficits secondary to Left ACA A2 infarct             -patient may  shower             -ELOS/Goals: 16-18d /ModI-S  Continue CIR- PT, OT and SLP 2.  Antithrombotics: -DVT/anticoagulation:  Pharmaceutical: Lovenox             -antiplatelet therapy: ASA/Brilinta.  3. Pain Management: Tylenol prn. 11/24- denies pain- con't regimen  4. Mood: LCSW to follow for evaluation and support.              -antipsychotic agents: N/A 5. Neuropsych: This patient is capable of making decisions on her own behalf. 6. Skin/Wound Care: Routine pressure relief measures.  7.  Fluids/Electrolytes/Nutrition: Monitor I/O. Check CMET in am. 8. Dyslipidemia: ON Lipitor daily.' 9. Constipation: Resolved and on Senna S 11/24- LBM 2 days ago- con't regimen  10. Tobacco abuse: Continue nicotine patch.  11. Azotemia  11/24- BUN 41 up from 28- will recheck in AM and push fluids.     LOS: 1 days A FACE TO FACE EVALUATION WAS PERFORMED  Aaniyah Strohm 10/08/2021, 8:18 AM

## 2021-10-09 ENCOUNTER — Encounter (HOSPITAL_COMMUNITY): Payer: Self-pay | Admitting: Physical Medicine and Rehabilitation

## 2021-10-09 DIAGNOSIS — I63522 Cerebral infarction due to unspecified occlusion or stenosis of left anterior cerebral artery: Secondary | ICD-10-CM | POA: Diagnosis not present

## 2021-10-09 LAB — BASIC METABOLIC PANEL
Anion gap: 8 (ref 5–15)
BUN: 31 mg/dL — ABNORMAL HIGH (ref 8–23)
CO2: 22 mmol/L (ref 22–32)
Calcium: 9.4 mg/dL (ref 8.9–10.3)
Chloride: 107 mmol/L (ref 98–111)
Creatinine, Ser: 1.06 mg/dL — ABNORMAL HIGH (ref 0.44–1.00)
GFR, Estimated: 57 mL/min — ABNORMAL LOW (ref >60)
Glucose, Bld: 128 mg/dL — ABNORMAL HIGH (ref 70–99)
Potassium: 4.1 mmol/L (ref 3.5–5.1)
Sodium: 137 mmol/L (ref 135–145)

## 2021-10-09 NOTE — Plan of Care (Signed)
  Problem: RH Balance Goal: LTG Patient will maintain dynamic standing with ADLs (OT) Description: LTG:  Patient will maintain dynamic standing balance with assist during activities of daily living (OT)  Flowsheets (Taken 10/09/2021 1643) LTG: Pt will maintain dynamic standing balance during ADLs with: Supervision/Verbal cueing   Problem: Sit to Stand Goal: LTG:  Patient will perform sit to stand in prep for activites of daily living with assistance level (OT) Description: LTG:  Patient will perform sit to stand in prep for activites of daily living with assistance level (OT) Flowsheets (Taken 10/09/2021 1643) LTG: PT will perform sit to stand in prep for activites of daily living with assistance level: Supervision/Verbal cueing   Problem: RH Eating Goal: LTG Patient will perform eating w/assist, cues/equip (OT) Description: LTG: Patient will perform eating with assist, with/without cues using equipment (OT) Flowsheets (Taken 10/09/2021 1643) LTG: Pt will perform eating with assistance level of: Independent   Problem: RH Grooming Goal: LTG Patient will perform grooming w/assist,cues/equip (OT) Description: LTG: Patient will perform grooming with assist, with/without cues using equipment (OT) Flowsheets (Taken 10/09/2021 1643) LTG: Pt will perform grooming with assistance level of: Independent with assistive device    Problem: RH Bathing Goal: LTG Patient will bathe all body parts with assist levels (OT) Description: LTG: Patient will bathe all body parts with assist levels (OT) Flowsheets (Taken 10/09/2021 1643) LTG: Pt will perform bathing with assistance level/cueing: Supervision/Verbal cueing   Problem: RH Dressing Goal: LTG Patient will perform upper body dressing (OT) Description: LTG Patient will perform upper body dressing with assist, with/without cues (OT). Flowsheets (Taken 10/09/2021 1643) LTG: Pt will perform upper body dressing with assistance level of: Set up  assist Goal: LTG Patient will perform lower body dressing w/assist (OT) Description: LTG: Patient will perform lower body dressing with assist, with/without cues in positioning using equipment (OT) Flowsheets (Taken 10/09/2021 1643) LTG: Pt will perform lower body dressing with assistance level of: Supervision/Verbal cueing   Problem: RH Toileting Goal: LTG Patient will perform toileting task (3/3 steps) with assistance level (OT) Description: LTG: Patient will perform toileting task (3/3 steps) with assistance level (OT)  Flowsheets (Taken 10/09/2021 1643) LTG: Pt will perform toileting task (3/3 steps) with assistance level: Supervision/Verbal cueing   Problem: RH Functional Use of Upper Extremity Goal: LTG Patient will use RT/LT upper extremity as a (OT) Description: LTG: Patient will use right/left upper extremity as a stabilizer/gross assist/diminished/nondominant/dominant level with assist, with/without cues during functional activity (OT) Flowsheets (Taken 10/09/2021 1643) LTG: Use of upper extremity in functional activities: RUE as dominant level LTG: Pt will use upper extremity in functional activity with assistance level of: Supervision/Verbal cueing   Problem: RH Toilet Transfers Goal: LTG Patient will perform toilet transfers w/assist (OT) Description: LTG: Patient will perform toilet transfers with assist, with/without cues using equipment (OT) Flowsheets (Taken 10/09/2021 1643) LTG: Pt will perform toilet transfers with assistance level of: Supervision/Verbal cueing   Problem: RH Tub/Shower Transfers Goal: LTG Patient will perform tub/shower transfers w/assist (OT) Description: LTG: Patient will perform tub/shower transfers with assist, with/without cues using equipment (OT) Flowsheets (Taken 10/09/2021 1643) LTG: Pt will perform tub/shower stall transfers with assistance level of: Supervision/Verbal cueing

## 2021-10-09 NOTE — Progress Notes (Signed)
PROGRESS NOTE   Subjective/Complaints:  Pt reports no pain- sleepy- but slept OK last night- spouse at bedside- LBM was yesterday- incontinent.   Denies any issues.  ROS:  Pt denies SOB, abd pain, CP, N/V/C/D, and vision changes  Objective:   No results found. Recent Labs    10/08/21 0516  WBC 7.9  HGB 14.1  HCT 42.0  PLT 259   Recent Labs    10/08/21 0516 10/09/21 0516  NA 136 137  K 3.9 4.1  CL 107 107  CO2 21* 22  GLUCOSE 105* 128*  BUN 41* 31*  CREATININE 0.90 1.06*  CALCIUM 9.4 9.4    Intake/Output Summary (Last 24 hours) at 10/09/2021 1411 Last data filed at 10/09/2021 1100 Gross per 24 hour  Intake 560 ml  Output --  Net 560 ml        Physical Exam: Vital Signs Blood pressure (!) 151/85, pulse 62, temperature 98 F (36.7 C), resp. rate 18, height 5\' 3"  (1.6 m), weight 72 kg, SpO2 96 %.    General: awake, alert, appropriate, spouse at bedside; supine in bed; drowsing; NAD HENT: conjugate gaze; oropharynx moist CV: regular rate; no JVD Pulmonary: CTA B/L; no W/R/R- good air movement GI: soft, NT, ND, (+)BS Psychiatric: appropriate but sleepy Neurological: vague but alert; sleepy Skin: No evidence of breakdown, no evidence of rash Neurologic: Cranial nerves II through XII intact, motor strength is 5/5 in bilateral deltoid, bicep, tricep, grip, 3/5 RIgh tand 5/5 Left hip flexor, knee extensors, ankle dorsiflexor and plantar flexor Sensory exam normal sensation to light touch and proprioception in bilateral upper and lower extremities Cerebellar exam mild bilateral intention tremor with  finger to nose to finger Musculoskeletal: Full range of motion in all 4 extremities. No joint swelling Assessment/Plan: 1. Functional deficits which require 3+ hours per day of interdisciplinary therapy in a comprehensive inpatient rehab setting. Physiatrist is providing close team supervision and 24 hour  management of active medical problems listed below. Physiatrist and rehab team continue to assess barriers to discharge/monitor patient progress toward functional and medical goals  Care Tool:  Bathing    Body parts bathed by patient: Right arm, Left arm, Chest, Abdomen, Front perineal area, Buttocks, Right upper leg, Left upper leg, Face   Body parts bathed by helper: Right lower leg, Left lower leg     Bathing assist Assist Level: Minimal Assistance - Patient > 75%     Upper Body Dressing/Undressing Upper body dressing   What is the patient wearing?: Pull over shirt    Upper body assist Assist Level: Contact Guard/Touching assist    Lower Body Dressing/Undressing Lower body dressing      What is the patient wearing?: Incontinence brief, Pants     Lower body assist Assist for lower body dressing: Minimal Assistance - Patient > 75%     Toileting Toileting    Toileting assist Assist for toileting: Minimal Assistance - Patient > 75%     Transfers Chair/bed transfer  Transfers assist     Chair/bed transfer assist level: Minimal Assistance - Patient > 75%     Locomotion Ambulation   Ambulation assist  Assist level: Minimal Assistance - Patient > 75% Assistive device: Walker-rolling Max distance: 150'   Walk 10 feet activity   Assist     Assist level: Minimal Assistance - Patient > 75% Assistive device: Walker-rolling   Walk 50 feet activity   Assist    Assist level: Minimal Assistance - Patient > 75% Assistive device: Walker-rolling    Walk 150 feet activity   Assist    Assist level: Minimal Assistance - Patient > 75% Assistive device: Walker-rolling    Walk 10 feet on uneven surface  activity   Assist Walk 10 feet on uneven surfaces activity did not occur: Safety/medical concerns         Wheelchair     Assist Is the patient using a wheelchair?: No Type of Wheelchair: Manual    Wheelchair assist level: Dependent -  Patient 0% Max wheelchair distance: 150'    Wheelchair 50 feet with 2 turns activity    Assist        Assist Level: Dependent - Patient 0%   Wheelchair 150 feet activity     Assist      Assist Level: Dependent - Patient 0%   Blood pressure (!) 151/85, pulse 62, temperature 98 F (36.7 C), resp. rate 18, height 5\' 3"  (1.6 m), weight 72 kg, SpO2 96 %.  Medical Problem List and Plan: 1. 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  2.  Antithrombotics: -DVT/anticoagulation:  Pharmaceutical: Lovenox             -antiplatelet therapy: ASA/Brilinta.  3. Pain Management: Tylenol prn. 11/24- denies pain- con't regimen  4. Mood: LCSW to follow for evaluation and support.              -antipsychotic agents: N/A 5. Neuropsych: This patient is capable of making decisions on her own behalf. 6. Skin/Wound Care: Routine pressure relief measures.  7. Fluids/Electrolytes/Nutrition: Monitor I/O. Check CMET in am. 8. Dyslipidemia: ON Lipitor daily.' 9. Constipation: Resolved and on Senna S 11/24- LBM 2 days ago- con't regimen   11/25- LBM yesterday- con't regimen 10. Tobacco abuse: Continue nicotine patch.  11. Azotemia  11/24- BUN 41 up from 28- will recheck in AM and push fluids. 11/25- BUN down from 41 to 31 and Cr 1.06- will recheck Monday and monitor     LOS: 2 days A FACE TO FACE EVALUATION WAS PERFORMED  Adedamola Seto 10/09/2021, 2:11 PM

## 2021-10-09 NOTE — Evaluation (Signed)
Speech Language Pathology Assessment and Plan  Patient Details  Name: Andrea Williamson MRN: [id number] Date of Birth: [birth year]  SLP Diagnosis: Cognitive Impairments  Rehab Potential: Good ELOS: 7-10 days    Today's Date: 10/09/2021 SLP Individual Time: 1300-1400 SLP Individual Time Calculation (min): 60 min   Hospital Problem: Principal Problem:   Acute ischemic left ACA stroke [facility])  Past Medical History: History reviewed. No pertinent past medical history. Past Surgical History:  Past Surgical History:  Procedure Laterality Date   ANKLE SURGERY Left 2008   IR CT HEAD LTD  10/05/2021   IR INTRA CRAN STENT  10/01/2021   IR PERCUTANEOUS ART THROMBECTOMY/INFUSION INTRACRANIAL INC DIAG ANGIO  10/01/2021   IR US GUIDE VASC ACCESS RIGHT  10/01/2021   RADIOLOGY WITH ANESTHESIA N/A 10/01/2021   Procedure: IR WITH ANESTHESIA;  Surgeon: Radiologist, Medication, MD;  Location: [place];  Service: Radiology;  Laterality: N/A;    Assessment / Plan / Recommendation  Andrea Williamson is a 70 year old female with lack of medical and in good health who was admitted on 10/01/21 with reports of RLE weakness X 2 days followed by fall due to RLE instability and noted to have difficulty speaking. UDS negative. 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 She continued to wax and wane in ED and CTA head/neck done revealing focal occlusion of Left A2 segment, moderate focal stenosis of proximal R-A2 segment and 27 cc penumbra in ACA distribution without core infarct. FShe underwent cerebral angio revealing occlusion of L-A2/ACA and underwent thrombectomy with revascularization and Left A2 stenting by Dr. Romie Jumper. Follow up MRI brain done revealing L-ACA and ACA/PCA watershed territory infarct with one new area of diffusion in right parietal lobe and small amount of petechial  hemorrhage. Dr. 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 Patient on ASA/Brilinta and Lipitor added for dyslipidemia with LDL-232. 2D echo done revealing EF 65-70% with mild concentric LVH and no wall abnormality. Apasia has resolved but she continues to have difficulty with higher level cognitive tasks, RLE weakness with balance deficits and  RLE weakness decrease in fine motor coordination. CIR recommended due to functional decline. Patient transferred to CIR on 10/07/2021 . Clinical Impression Patient presents with a mild cognitive impairment as per this evaluation. On Cognistat test, her score of 7 places her in mild impairment range. She reported history of not seeing any doctors and not taking any medications. For reasoning and judgement sections, patient would either give up quickly saying, "I dont know" or would not elaborate/fully explain responses. When presented with memory testing sections of CLQT, she did start to appear more engaged in testing. She completed all subtests to obtain a score on 'Memory' section of CLQT and her score of 133 corresponded to 'moderate' severity impairment for memory. She was oriented fully and able to describe some of her physical deficits. She appeared very pleased when talking about how her leg had been "dead" and now she is able to move it a lot. SLP discussed patient's cognitive deficits in general terms and she verbalized agreement in therapy. She will benefit from skilled SLP intervention to maximize cognitive function prior to discharge.  Skilled Therapeutic Interventions          SLE, Cognistat, CLQT      SLP Assessment  Patient will need skilled Speech Lanaguage Pathology Services during CIR admission    Recommendations  Patient destination: Home Follow up Recommendations: None Equipment Recommended: None recommended by SLP    SLP Frequency 3 to 5 out of 7 days   SLP Duration  SLP Intensity  SLP Treatment/Interventions 7-10 days  Minumum of 1-2 x/day, 30 to 90 minutes  Cognitive  remediation/compensation;Multimodal communication approach;Functional tasks;Patient/family education;Medication managment    Pain Pain Assessment Pain Scale: 0-10 Pain Score: 0-No pain  Prior Functioning Cognitive/Linguistic Baseline: Within functional limits Type of Home: House  Lives With: Spouse Available Help at Discharge: Family;Available 24 hours/day Education: completed HS 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, etc on train))  SLP Evaluation Cognition Overall Cognitive Status: Impaired/Different from baseline Arousal/Alertness: Awake/alert Orientation Level: Oriented X4 Year: 2022 Month: November Day of Week: Correct Attention: Selective Selective Attention: Impaired Selective Attention Impairment: Verbal complex Memory: Impaired Memory Impairment: Retrieval deficit;Storage deficit Awareness: Impaired Awareness Impairment: Anticipatory impairment Problem Solving: Impaired Problem Solving Impairment: Verbal complex Executive Function: Reasoning Reasoning: Impaired Reasoning Impairment: Verbal complex Safety/Judgment: Impaired  Comprehension Auditory Comprehension Overall Auditory Comprehension: Appears within functional limits for tasks assessed Expression Expression Primary Mode of Expression: Verbal Verbal Expression Overall Verbal Expression: Appears within functional limits for tasks assessed Pragmatics: No impairment Written Expression Dominant Hand: Right Written Expression: Not tested Oral Motor Oral Motor/Sensory Function Overall Oral Motor/Sensory Function: Within functional limits Motor Speech Overall Motor Speech: Appears within functional limits for tasks assessed Articulation: Within functional limitis Intelligibility: Intelligible Motor Planning: Witnin functional limits  Care Tool Care Tool Cognition Ability to hear (with hearing aid or hearing appliances if normally used Ability to hear (with hearing aid or  hearing appliances if normally used): 0. Adequate - no difficulty in normal conservation, social interaction, listening to TV   Expression of Ideas and Wants Expression of Ideas and Wants: 3. Some difficulty - exhibits some difficulty with expressing needs and ideas (e.g, some words or finishing thoughts) or speech is not clear   Understanding Verbal and Non-Verbal Content Understanding Verbal and Non-Verbal Content: 3. Usually understands - understands most conversations, but misses some part/intent of message. Requires cues at times to understand  Memory/Recall Ability Memory/Recall Ability : Current season;That he or she is in a hospital/hospital unit     Short Term Goals: Week 1: SLP Short Term Goal 1 (Week 1): Patient will perform simulated medication organization task (pill box) with supervision A for accuracy. SLP Short Term Goal 2 (Week 1): Patient will demonstrate adequate anticipatory awareness during functional, ADL tasks and during discharge planning discussion with SLP, with supervisionA. SLP Short Term Goal 3 (Week 1): Patient will recall and restate specific information from daily therapeutic and medical interventions with supervisionA cues. SLP Short Term Goal 4 (Week 1): Patient will demonstrate adequate alternating attention during completion of functional, mild complex to complex level cognitive tasks, with supervisionA.  Refer to Care Plan for Long Term Goals  Recommendations for other services: None   Discharge Criteria: Patient will be discharged from SLP if patient refuses treatment 3 consecutive times without medical reason, if treatment goals not met, if there is a change in medical status, if patient makes no progress towards goals or if patient is discharged from hospital.  The above assessment, treatment plan, treatment alternatives and goals were discussed and mutually agreed upon: by patient  Sonia Baller, MA, CCC-SLP Speech Therapy

## 2021-10-09 NOTE — IPOC Note (Signed)
Overall Plan of Care [facility]) Patient Details Name: Andrea Williamson MRN: [id number] DOB: [birth year]  Admitting Diagnosis: Acute ischemic left ACA stroke [facility])  Hospital Problems: Principal Problem:   Acute ischemic left ACA stroke ([place])     Functional Problem List: Nursing Bladder, Endurance, Medication Management, Safety, Perception  Andrea Williamson Balance, Endurance, Motor, Perception, Safety, Sensory  OT Balance, Cognition, Endurance, Motor, Safety, Sensory  SLP    TR         Basic ADL's: OT Grooming, Bathing, Dressing, Toileting, Eating     Advanced  ADL's: OT       Transfers: Andrea Williamson Bed Mobility, Bed to Chair, Car, Furniture, Futures trader, Metallurgist: Andrea Williamson Ambulation, Emergency planning/management officer, Stairs     Additional Impairments: OT Fuctional Use of Upper Extremity  SLP        TR      Anticipated Outcomes Item Anticipated Outcome  Self Feeding Independent  Swallowing      Basic self-care  Media planner Transfers Supervision  Bowel/Bladder  supervision  Transfers  Supervision  Locomotion  Supervision with LRAD at ambulatory level  Communication     Cognition     Pain  n/a  Safety/Judgment  supervision and no falls   Therapy Plan: Andrea Williamson Intensity: Minimum of 1-2 x/day ,45 to 90 minutes Andrea Williamson Frequency: 5 out of 7 days Andrea Williamson Duration Estimated Length of Stay: 7-10 days OT Intensity: Minimum of 1-2 x/day, 45 to 90 minutes OT Frequency: 5 out of 7 days OT Duration/Estimated Length of Stay: 7-10 days     Due to the current state of emergency, patients may not be receiving their 3-hours of Medicare-mandated therapy.   Team Interventions: Nursing Interventions Patient/Family Education, Bladder Management, Disease Management/Prevention, Medication Management, Discharge Planning  Andrea Williamson interventions Ambulation/gait training, Balance/vestibular training, Cognitive remediation/compensation, Community reintegration, Discharge planning,  Disease management/prevention, DME/adaptive equipment instruction, Functional electrical stimulation, Functional mobility training, Neuromuscular re-education, Pain management, Patient/family education, Splinting/orthotics, Stair training, Therapeutic Activities, Therapeutic Exercise, UE/LE Strength taining/ROM, UE/LE Coordination activities  OT Interventions Balance/vestibular training, Discharge planning, Functional electrical stimulation, Pain management, Self Care/advanced ADL retraining, Therapeutic Activities, UE/LE Coordination activities, Visual/perceptual remediation/compensation, Therapeutic Exercise, Skin care/wound managment, Patient/family education, Functional mobility training, Disease mangement/prevention, Cognitive remediation/compensation, Community reintegration, DME/adaptive equipment instruction, Neuromuscular re-education, Psychosocial support, Splinting/orthotics, UE/LE Strength taining/ROM, Wheelchair propulsion/positioning  SLP Interventions    TR Interventions    SW/CM Interventions Discharge Planning, Psychosocial Support, Patient/Family Education   Barriers to Discharge MD  Medical stability, Home enviroment access/loayout, Incontinence, Neurogenic bowel and bladder, Lack of/limited family support, Weight, and Weight bearing restrictions  Nursing Decreased caregiver support, Incontinence, Lack of/limited family support, Weight, Medication compliance Discharging to 1 level home with 1 step to enter. Can reach both rails. Discharging to son's home. Son and daughter-n-law will be available 24/7 to assist at discharge.  Andrea Williamson      OT      SLP      SW       Team Discharge Planning: Destination: Andrea Williamson-Home ,OT- Home , SLP-  Projected Follow-up: Andrea Williamson-Home health Andrea Williamson, OT-  Outpatient OT, SLP-  Projected Equipment Needs: Andrea Williamson-Rolling walker with 5" wheels, OT- To be determined, SLP-  Equipment Details: Andrea Williamson- , OT-  Patient/family involved in discharge planning: Andrea Williamson- Patient, Family  member/caregiver,  OT-Patient, SLP-   MD ELOS: 7-10 days Medical Rehab Prognosis:  Good Assessment: Andrea Williamson is a 70 yr old female with tobacco abuse admitted for L ACA  infract and R hemiplegia- Having azotemia and constipation- are improving-  Goals are supervision by d/c.      See Team Conference Notes for weekly updates to the plan of care

## 2021-10-09 NOTE — Progress Notes (Signed)
Patient ID: Andrea Williamson, female   DOB: [birth year], 70 y.o.   MRN: [id number]   SW made efforts to meet with pt, but pt sleeping. SW will continue to make efforts to complete pt assessment.   Loralee Pacas, MSW, [place] Office: [phone] Cell: [phone] Fax: [phone]

## 2021-10-09 NOTE — Progress Notes (Signed)
Inpatient Rehabilitation Care Coordinator Assessment and Plan Patient Details  Name: Andrea Williamson MRN: [id number] Date of Birth: 1950/12/20  Today's Date: 10/09/2021  Hospital Problems: Principal Problem:   Acute ischemic left ACA stroke [facility])  Past Medical History: History reviewed. No pertinent past medical history. Past Surgical History:  Past Surgical History:  Procedure Laterality Date   ANKLE SURGERY Left 2008   IR CT HEAD LTD  10/05/2021   IR INTRA CRAN STENT  10/01/2021   IR PERCUTANEOUS ART THROMBECTOMY/INFUSION INTRACRANIAL INC DIAG ANGIO  10/01/2021   IR US GUIDE VASC ACCESS RIGHT  10/01/2021   RADIOLOGY WITH ANESTHESIA N/A 10/01/2021   Procedure: IR WITH ANESTHESIA;  Surgeon: Radiologist, Medication, MD;  Location: [place];  Service: Radiology;  Laterality: N/A;   Social History:  reports that she has never smoked. She has never used smokeless tobacco. She reports that she does not currently use alcohol. She reports that she does not currently use drugs.  Family / Support Systems Marital Status: Married How Long?: Unsure on how long Patient Roles: Spouse, Parent Spouse/Significant Other: Broadus John (husbnad) :[phone]. Pt reports he is not able to speak as he has no voicebox due to terminal cancer. Children: 3 children: Jimmy-lives in [place]; [place] lives in [place]; [place] lives in [place] (around the corner from mother). Other Supports: none reported Anticipated Caregiver: son Cletus Gash Ability/Limitations of Caregiver: Pt to d/c to her son Cletus Gash Caregiver Availability: 24/7 Family Dynamics: Pt lives with her husband. Pt was primary caregiver for her husband prior to stroke. Pt has two sons that livelocally.  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 Education: high school [place] - How often do you need to have someone help you when you read  instructions, pamphlets, or other written material from your doctor or pharmacy?: Never Writes: Yes Employment Status: Retired Date Retired/Disabled/Unemployed: 2013 Age Retired: 61 Public relations account executive Issues: Denies Guardian/Conservator: N/A   Abuse/Neglect Abuse/Neglect Assessment Can Be Completed: Yes Physical Abuse: Denies Verbal Abuse: Denies Sexual Abuse: Denies Exploitation of patient/patient's resources: Denies Self-Neglect: Denies  Patient response to: Social Isolation - How often do you feel lonely or isolated from those around you?: Never  Emotional Status Pt's affect, behavior and adjustment status: Pt in good spirits at time of visit. Pt experienced some trouble remember specific aspects in life such as how long has she been married and where one of her sone lives in [place]. Recent Psychosocial Issues: Denies Psychiatric History: Denies Substance Abuse History: Pt admits that she smokes cigarettes for a long time and currenlty using cigarette patching.  Patient / Family Perceptions, Expectations & Goals Pt/Family understanding of illness & functional limitations: Pt and family have a general understanding of pt care needs Premorbid pt/family roles/activities: Independent Anticipated changes in roles/activities/participation: Assistance with ADLs/IADLs Pt/family expectations/goals: Pt goal is" getting better and getting home." With probing, pt reports she would like to work on walking and balance.  Community Resources [employer]: None Premorbid Home Care/DME Agencies: None Transportation available at discharge: Son Cletus Gash Is the patient able to respond to transportation needs?: Yes In the past 12 months, has lack of transportation kept you from medical appointments or from getting medications?: No In the past 12 months, has lack of transportation kept you from meetings, work, or from getting things needed for daily living?: No Resource referrals  recommended: Neuropsychology  Discharge Planning Living Arrangements: Children, Other  relatives Support Systems: Children Type of Residence: Private residence Insurance Resources: Multimedia programmer (specify) Education officer, environmental) Financial Resources: [place], Other (Comment) (retirement/savings) Financial Screen Referred: No Living Expenses: Medical laboratory scientific officer Management: Patient Does the patient have any problems obtaining your medications?: No Home Management: Pt managed all homecare needs. Patient/Family Preliminary Plans: TBD Care Coordinator Barriers to Discharge: Decreased caregiver support, Lack of/limited family support Care Coordinator Anticipated Follow Up Needs: HH/OP  Clinical Impression SW met with pt in room to introduce self, explain role, and discuss discharge process. Pt is not a English as a second language teacher. No HCPOA. No DME. Pt aware SW will follow-up with her son Cletus Gash.   71- SW spoke with pt son Cletus Gash [phone]) to confirm d/c plan to his how. He confirms. No questions/concerns at this time. Aware SW will follow-up after team conference.   Avenell Sellers A Rosamae Rocque 10/09/2021, 3:09 PM

## 2021-10-09 NOTE — Evaluation (Signed)
Occupational Therapy Assessment and Plan  Patient Details  Name: Andrea Williamson MRN: [id number] Date of Birth: 05-23-1951  OT Diagnosis: abnormal posture, cognitive deficits, hemiplegia affecting dominant side, and muscle weakness (generalized) Rehab Potential:   ELOS: 7-10 days   Today's Date: 10/09/2021 OT Individual Time:  15- 825     85 mins  Hospital Problem: Principal Problem:   Acute ischemic left ACA stroke ([facility])   Past Medical History: History reviewed. No pertinent past medical history. Past Surgical History:  Past Surgical History:  Procedure Laterality Date   ANKLE SURGERY Left 2008   IR CT HEAD LTD  10/05/2021   IR INTRA CRAN STENT  10/01/2021   IR PERCUTANEOUS ART THROMBECTOMY/INFUSION INTRACRANIAL INC DIAG ANGIO  10/01/2021   IR US GUIDE VASC ACCESS RIGHT  10/01/2021   RADIOLOGY WITH ANESTHESIA N/A 10/01/2021   Procedure: IR WITH ANESTHESIA;  Surgeon: Radiologist, Medication, MD;  Location: [facility];  Service: Radiology;  Laterality: N/A;    Assessment & Plan Clinical Impression: Andrea Fuquay. Williamson is a 70 year old female with lack of medical and in good health who was admitted on 10/01/21 with reports of RLE weakness X 2 days followed by fall due to RLE instability and noted to have difficulty speaking. UDS negative. 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 She continued to wax and wane in ED and CTA head/neck done revealing focal occlusion of Left A2 segment, moderate focal stenosis of proximal R-A2 segment and 27 cc penumbra in ACA distribution without core infarct. FShe underwent cerebral angio revealing occlusion of L-A2/ACA and underwent thrombectomy with revascularization and Left A2 stenting by Dr. Romie Jumper. Follow up MRI brain done revealing L-ACA and ACA/PCA watershed territory infarct with one new area of diffusion in right parietal lobe and small amount of petechial  hemorrhage. Dr. 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 Patient on ASA/Brilinta and Lipitor added for dyslipidemia with LDL-232. 2D echo done revealing EF 65-70% with mild concentric LVH and no wall abnormality. Apasia has resolved but she continues to have difficulty with higher level cognitive tasks, RLE weakness with balance deficits and  RLE weakness decrease in fine motor coordination. CIR recommended due to functional decline. Patient transferred to CIR on 10/07/2021 .    Patient currently requires min with basic self-care skills secondary to muscle weakness, decreased cardiorespiratoy endurance, decreased coordination and decreased motor planning, decreased motor planning, decreased initiation, decreased attention, decreased awareness, and decreased problem solving, and decreased sitting balance, decreased standing balance, decreased postural control, hemiplegia, and decreased balance strategies.  Prior to hospitalization, patient could complete BADL and IADL with independent .  Patient will benefit from skilled intervention to decrease level of assist with basic self-care skills and increase independence with basic self-care skills prior to discharge home with care partner.  Anticipate patient will require intermittent supervision and follow up home health.  OT - End of Session Activity Tolerance: Tolerates 30+ min activity with multiple rests Endurance Deficit: Yes Endurance Deficit Description: frequent rest breaks during functional activity OT Assessment OT Patient demonstrates impairments in the following area(s): Balance;Cognition;Endurance;Motor;Safety;Sensory OT Basic ADL's Functional Problem(s): Grooming;Bathing;Dressing;Toileting;Eating OT Transfers Functional Problem(s): Toilet;Tub/Shower OT Additional Impairment(s): Fuctional Use of Upper Extremity OT Plan OT Intensity: Minimum of 1-2 x/day, 45 to 90 minutes OT Frequency: 5 out of 7 days OT Duration/Estimated Length of Stay: 7-10  days OT Treatment/Interventions: Balance/vestibular training;Discharge planning;Functional electrical stimulation;Pain  management;Self Care/advanced ADL retraining;Therapeutic Activities;UE/LE Coordination activities;Visual/perceptual remediation/compensation;Therapeutic Exercise;Skin care/wound managment;Patient/family education;Functional mobility training;Disease mangement/prevention;Cognitive remediation/compensation;Community reintegration;DME/adaptive equipment instruction;Neuromuscular re-education;Psychosocial support;Splinting/orthotics;UE/LE Strength taining/ROM;Wheelchair propulsion/positioning OT Self Feeding Anticipated Outcome(s): Independent OT Basic Self-Care Anticipated Outcome(s): Supervision OT Toileting Anticipated Outcome(s): Supervision OT Bathroom Transfers Anticipated Outcome(s): Supervision OT Recommendation Recommendations for Other Services: Neuropsych consult Patient destination: Home Follow Up Recommendations: Outpatient OT Equipment Recommended: To be determined   OT Evaluation Precautions/Restrictions  Precautions Precautions: Fall Precaution Comments: watch HR - periods of tachycardia per acute, mild R hemi Restrictions Weight Bearing Restrictions: No Pain Pain Assessment Pain Scale: 0-10 Pain Score: 0-No pain Home Living/Prior Functioning Home Living Family/patient expects to be discharged to:: Private residence Living Arrangements: Spouse/significant other Available Help at Discharge: Family, Available 24 hours/day Type of Home: House Home Access: Stairs to enter [employer] of Steps: 2 Entrance Stairs-Rails: Can reach both, Right, Left Home Layout: One level Bathroom Shower/Tub: Research officer, trade [employer] Accessibility: Yes  Lives With: Spouse Prior Function Level of Independence: Independent with gait, Independent with transfers, Independent with homemaking with ambulation, Independent with basic ADLs  Able to Take Stairs?:  Yes Driving: Yes Vocation: Retired Surveyor, mining Baseline Vision/History: 1 Wears glasses (per chart, no visual deficits noted at eval) Ability to See in Adequate Light: 0 Adequate Patient Visual Report: No change from baseline Vision Assessment?: No apparent visual deficits Perception  Perception: Impaired Inattention/Neglect: Impaired-to be further tested in functional context Praxis Praxis: Impaired Cognition Overall Cognitive Status: Impaired/Different from baseline Arousal/Alertness: Awake/alert Orientation Level: Person;Place;Situation Person: Oriented Place: Oriented Situation: Oriented Year: 2022 Month: November Day of Week: Correct Memory: Impaired Memory Impairment: Retrieval deficit;Storage deficit Immediate Memory Recall: Sock;Blue;Bed Memory Recall Sock: Without Cue Memory Recall Blue: Without Cue Memory Recall Bed: With Cue Attention: Focused;Sustained Focused Attention: Appears intact Sustained Attention: Appears intact Awareness: Impaired Awareness Impairment: Anticipatory impairment Problem Solving: Impaired Safety/Judgment: Impaired Sensation Sensation Light Touch: Appears Intact Proprioception: Impaired Detail Proprioception Impaired Details: Impaired RUE;Impaired RLE Coordination Gross Motor Movements are Fluid and Coordinated: No Fine Motor Movements are Fluid and Coordinated: No Coordination and Movement Description: impaired 2/2 R hemi Heel Shin Test: impaired RLE Motor  Motor Motor: Hemiplegia;Abnormal postural alignment and control Motor - Skilled Clinical Observations: R hemi  Trunk/Postural Assessment  Cervical Assessment Cervical Assessment: Exceptions to [facility] Thoracic Assessment Thoracic Assessment: Exceptions to [facility] Lumbar Assessment Lumbar Assessment: Exceptions to [facility] Postural Control Postural Control: Deficits on evaluation Righting Reactions: impaired/delayed  Balance Balance Balance Assessed: Yes Standardized Balance  Assessment Standardized Balance Assessment: Berg Balance Test Berg Balance Test Sit to Stand: Needs minimal aid to stand or to stabilize Standing Unsupported: Able to stand 2 minutes with supervision Sitting with Back Unsupported but Feet Supported on Floor or Stool: Able to sit safely and securely 2 minutes Stand to Sit: Sits independently, has uncontrolled descent Transfers: Needs one person to assist Standing Unsupported with Eyes Closed: Able to stand 10 seconds with supervision Standing Ubsupported with Feet Together: Able to place feet together independently and stand for 1 minute with supervision From Standing, Reach Forward with Outstretched Arm: Loses balance while trying/requires external support From Standing Position, Pick up Object from Floor: Unable to try/needs assist to keep balance From Standing Position, Turn to Look Behind Over each Shoulder: Needs supervision when turning Turn 360 Degrees: Needs assistance while turning Standing Unsupported, Alternately Place Feet on Step/Stool: Needs assistance to keep from falling or unable to try Standing Unsupported, One Foot in Front: Needs help to step but can hold 15 seconds Standing  on One Leg: Tries to lift leg/unable to hold 3 seconds but remains standing independently Total Score: 19 Static Sitting Balance Static Sitting - Balance Support: No upper extremity supported;Feet supported Static Sitting - Level of Assistance: 5: Stand by assistance Dynamic Sitting Balance Dynamic Sitting - Balance Support: No upper extremity supported;Feet supported;During functional activity Dynamic Sitting - Level of Assistance: 4: Min assist Static Standing Balance Static Standing - Balance Support: Bilateral upper extremity supported;During functional activity Static Standing - Level of Assistance: 4: Min assist Dynamic Standing Balance Dynamic Standing - Balance Support: Bilateral upper extremity supported;During functional activity Dynamic  Standing - Level of Assistance: 4: Min assist Dynamic Standing - Balance Activities: Forward lean/weight shifting;Lateral lean/weight shifting;Reaching for objects Extremity/Trunk Assessment RUE Assessment RUE Assessment: Exceptions to [facility] Active Range of Motion (AROM) Comments: WFL General Strength Comments: grossly 4/5 strength, intention tremors and decreased grip strength LUE Assessment LUE Assessment: Within Functional Limits  Care Tool Care Tool Self Care Eating   Eating Assist Level: Set up assist    Oral Care    Oral Care Assist Level: Minimal Assistance - Patient > 75%    Bathing      Min A        Upper Body Dressing(including orthotics)      Supervision      Lower Body Dressing (excluding footwear)      Min A    Putting on/Taking off footwear    Min A         Care Tool Toileting Toileting activity   Assist for toileting: Minimal Assistance - Patient > 75%     Care Tool Bed Mobility Roll left and right activity   Roll left and right assist level: Contact Guard/Touching assist    Sit to lying activity   Sit to lying assist level: Contact Guard/Touching assist    Lying to sitting on side of bed activity   Lying to sitting on side of bed assist level: the ability to move from lying on the back to sitting on the side of the bed with no back support.: Contact Guard/Touching assist     Care Tool Transfers Sit to stand transfer   Sit to stand assist level: Minimal Assistance - Patient > 75%    Chair/bed transfer   Chair/bed transfer assist level: Minimal Assistance - Patient > 75%     Toilet transfer   Assist Level: Minimal Assistance - Patient > 75%     Care Tool Cognition  Expression of Ideas and Wants Expression of Ideas and Wants: 3. Some difficulty - exhibits some difficulty with expressing needs and ideas (e.g, some words or finishing thoughts) or speech is not clear  Understanding Verbal and Non-Verbal Content Understanding Verbal and  Non-Verbal Content: 3. Usually understands - understands most conversations, but misses some part/intent of message. Requires cues at times to understand   Memory/Recall Ability Memory/Recall Ability : Current season;Location of own room;Staff names and faces;That he or she is in a hospital/hospital unit   Refer to Care Plan for [facility] OT Short Term Goal 1 (Week 1): STGs = LTGs 2/2 ELOS at Supervision  Recommendations for other services: Neuropsych   Skilled Therapeutic Intervention ADL ADL Eating: Not assessed Grooming: Setup Upper Body Bathing: Supervision/safety Lower Body Bathing: Minimal assistance Upper Body Dressing: Supervision/safety Lower Body Dressing: Minimal assistance Toileting: Contact guard Toilet Transfer: Contact guard Toilet Transfer Method: Stand pivot Mobility  Bed Mobility Bed Mobility: Rolling Right;Rolling Left;Supine  to Sit;Sit to Supine Rolling Right: Contact Guard/Touching assist Rolling Left: Contact Guard/Touching assist Supine to Sit: Contact Guard/Touching assist Sit to Supine: Contact Guard/Touching assist Transfers Sit to Stand: Minimal Assistance - Patient > 75% Stand to Sit: Minimal Assistance - Patient > 75%   Skilled Interventions: Pt greeted at time of session semireclined in bed with husband present who remained throughout session. Initial part of session retrieving manual wheelchair 18x20 with cushion for OT session/eval. Bed mobility performed with Min/CGA and stand pivot to wheelchair same manner. ADL performed at sink with UB/LB bathing and dressing with Min A overall for LB, plan to educate with LHS in the future. Note pt able to perform sit <> stands at sink with Min/CGA throughout. Educated on OT purpose and POC. Pt returned to bed with Min/CGA and use of RW. Alarm on call bell in reach.     Discharge Criteria: Patient will be discharged from OT if patient refuses treatment 3 consecutive times  without medical reason, if treatment goals not met, if there is a change in medical status, if patient makes no progress towards goals or if patient is discharged from hospital.  The above assessment, treatment plan, treatment alternatives and goals were discussed and mutually agreed upon: by patient  [name] 10/09/2021, 12:54 PM

## 2021-10-09 NOTE — Progress Notes (Signed)
Inpatient Rehabilitation  Patient information reviewed and entered into e[facility] system by Jayvian Escoe Meilah Delrosario, OTR/L.   Information including medical coding, functional ability and quality indicators will be reviewed and updated through discharge.    

## 2021-10-09 NOTE — Plan of Care (Signed)
  Problem: RH Balance Goal: LTG Patient will maintain dynamic standing balance (PT) Description: LTG:  Patient will maintain dynamic standing balance with assistance during mobility activities (PT) Flowsheets (Taken 10/09/2021 1227) LTG: Pt will maintain dynamic standing balance during mobility activities with:: Supervision/Verbal cueing   Problem: Sit to Stand Goal: LTG:  Patient will perform sit to stand with assistance level (PT) Description: LTG:  Patient will perform sit to stand with assistance level (PT) Flowsheets (Taken 10/09/2021 1227) LTG: PT will perform sit to stand in preparation for functional mobility with assistance level: Supervision/Verbal cueing   Problem: RH Bed Mobility Goal: LTG Patient will perform bed mobility with assist (PT) Description: LTG: Patient will perform bed mobility with assistance, with/without cues (PT). Flowsheets (Taken 10/09/2021 1227) LTG: Pt will perform bed mobility with assistance level of: Independent   Problem: RH Bed to Chair Transfers Goal: LTG Patient will perform bed/chair transfers w/assist (PT) Description: LTG: Patient will perform bed to chair transfers with assistance (PT). Flowsheets (Taken 10/09/2021 1227) LTG: Pt will perform Bed to Chair Transfers with assistance level: Supervision/Verbal cueing   Problem: RH Car Transfers Goal: LTG Patient will perform car transfers with assist (PT) Description: LTG: Patient will perform car transfers with assistance (PT). Flowsheets (Taken 10/09/2021 1227) LTG: Pt will perform car transfers with assist:: Supervision/Verbal cueing   Problem: RH Ambulation Goal: LTG Patient will ambulate in controlled environment (PT) Description: LTG: Patient will ambulate in a controlled environment, # of feet with assistance (PT). Flowsheets (Taken 10/09/2021 1227) LTG: Pt will ambulate in controlled environ  assist needed:: Supervision/Verbal cueing LTG: Ambulation distance in controlled environment:  150 ft with LRAD Goal: LTG Patient will ambulate in home environment (PT) Description: LTG: Patient will ambulate in home environment, # of feet with assistance (PT). Flowsheets (Taken 10/09/2021 1227) LTG: Pt will ambulate in home environ  assist needed:: Supervision/Verbal cueing LTG: Ambulation distance in home environment: 75 ft with LRAD Goal: LTG Patient will ambulate in community environment (PT) Description: LTG: Patient will ambulate in community environment, # of feet with assistance (PT). Flowsheets (Taken 10/09/2021 1227) LTG: Pt will ambulate in community environ  assist needed:: Supervision/Verbal cueing LTG: Ambulation distance in community environment: 300 ft with LRAD   Problem: RH Stairs Goal: LTG Patient will ambulate up and down stairs w/assist (PT) Description: LTG: Patient will ambulate up and down # of stairs with assistance (PT) Flowsheets (Taken 10/09/2021 1227) LTG: Pt will ambulate up/down stairs assist needed:: Supervision/Verbal cueing LTG: Pt will  ambulate up and down number of stairs: 3 stairs with 2 handrails

## 2021-10-09 NOTE — Evaluation (Signed)
Physical Therapy Assessment and Plan  Patient Details  Name: Andrea Williamson MRN: [id number] Date of Birth: November 24, 1950  PT Diagnosis: Abnormality of gait, Hemiplegia dominant, Impaired cognition, Impaired sensation, and Muscle weakness Rehab Potential: Good ELOS: 7-10 days   Today's Date: 10/09/2021 PT Individual Time: 5697-9480 PT Individual Time Calculation (min): 70 min    Hospital Problem: Principal Problem:   Acute ischemic left ACA stroke [facility])   Past Medical History: History reviewed. No pertinent past medical history. Past Surgical History:  Past Surgical History:  Procedure Laterality Date   ANKLE SURGERY Left 2008   IR CT HEAD LTD  10/05/2021   IR INTRA CRAN STENT  10/01/2021   IR PERCUTANEOUS ART THROMBECTOMY/INFUSION INTRACRANIAL INC DIAG ANGIO  10/01/2021   IR US GUIDE VASC ACCESS RIGHT  10/01/2021   RADIOLOGY WITH ANESTHESIA N/A 10/01/2021   Procedure: IR WITH ANESTHESIA;  Surgeon: Radiologist, Medication, MD;  Location: [place];  Service: Radiology;  Laterality: N/A;    Assessment & Plan Clinical Impression:  Andrea Williamson is a 70 year old female with lack of medical and in good health who was admitted on 10/01/21 with reports of RLE weakness X 2 days followed by fall due to RLE instability and noted to have difficulty speaking. UDS negative. 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 She continued to wax and wane in ED and CTA head/neck done revealing focal occlusion of Left A2 segment, moderate focal stenosis of proximal R-A2 segment and 27 cc penumbra in ACA distribution without core infarct. FShe underwent cerebral angio revealing occlusion of L-A2/ACA and underwent thrombectomy with revascularization and Left A2 stenting by Dr. Romie Jumper. Follow up MRI brain done revealing L-ACA and ACA/PCA watershed territory infarct with one new area of diffusion in right parietal lobe and small amount of petechial  hemorrhage. Dr. 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 Patient on ASA/Brilinta and Lipitor added for dyslipidemia with LDL-232. 2D echo done revealing EF 65-70% with mild concentric LVH and no wall abnormality. Apasia has resolved but she continues to have difficulty with higher level cognitive tasks, RLE weakness with balance deficits and  RLE weakness decrease in fine motor coordination. CIR recommended due to functional decline. Patient transferred to CIR on 10/07/2021 .   Patient currently requires min with mobility secondary to muscle weakness, decreased cardiorespiratoy endurance, decreased coordination, decreased awareness, decreased problem solving, decreased safety awareness, and decreased memory, and decreased sitting balance, decreased standing balance, decreased postural control, hemiplegia, and decreased balance strategies.  Prior to hospitalization, patient was independent  with mobility and lived with Spouse in a House home.  Home access is 2Stairs to enter.  Patient will benefit from skilled PT intervention to maximize safe functional mobility, minimize fall risk, and decrease caregiver burden for planned discharge home with 24 hour supervision.  Anticipate patient will benefit from follow up [place] at discharge.  PT - End of Session Activity Tolerance: Tolerates 30+ min activity with multiple rests Endurance Deficit: Yes Endurance Deficit Description: frequent rest breaks during functional activity PT Assessment Rehab Potential (ACUTE/IP ONLY): Good PT Patient demonstrates impairments in the following area(s): Balance;Endurance;Motor;Perception;Safety;Sensory PT Transfers Functional Problem(s): Bed Mobility;Bed to Chair;Car;Furniture;Floor PT Locomotion Functional Problem(s): Ambulation;Wheelchair Mobility;Stairs PT Plan PT Intensity: Minimum of 1-2 x/day ,45 to 90 minutes PT Frequency: 5 out of 7 days PT Duration Estimated Length of Stay: 7-10 days PT  Treatment/Interventions: Ambulation/gait training;Balance/vestibular training;Cognitive  remediation/compensation;Community reintegration;Discharge planning;Disease management/prevention;DME/adaptive equipment instruction;Functional electrical stimulation;Functional mobility training;Neuromuscular re-education;Pain management;Patient/family education;Splinting/orthotics;Stair training;Therapeutic Activities;Therapeutic Exercise;UE/LE Strength taining/ROM;UE/LE Coordination activities PT Transfers Anticipated Outcome(s): Supervision PT Locomotion Anticipated Outcome(s): Supervision with LRAD at ambulatory level PT Recommendation Follow Up Recommendations: Home health PT Patient destination: Home Equipment Recommended: Rolling walker with 5" wheels   PT Evaluation Precautions/Restrictions Precautions Precautions: Fall Precaution Comments: watch HR - periods of tachycardia per acute, mild R hemi Restrictions Weight Bearing Restrictions: No Pain Pain Assessment Pain Scale: 0-10 Pain Score: 0-No pain Pain Interference Pain Interference Pain Effect on Sleep: 1. Rarely or not at all Pain Interference with Therapy Activities: 1. Rarely or not at all Pain Interference with Day-to-Day Activities: 1. Rarely or not at all Home Living/Prior [place] expects to be discharged to:: Private residence Living Arrangements: Spouse/significant other Available Help at Discharge: Family;Available 24 hours/day Type of Home: House Home Access: Stairs to enter [employer] of Steps: 2 Entrance Stairs-Rails: Can reach both;Right;Left Home Layout: One level  Lives With: Spouse Prior Function Level of Independence: Independent with gait;Independent with transfers  Able to Take Stairs?: Yes Driving: Yes Vocation: Retired Radiographer, therapeutic - History Baseline Vision: Wears glasses only for reading Patient Visual Report: No change from  baseline Perception Perception: Impaired Inattention/Neglect: Impaired-to be further tested in functional context Praxis Praxis: Impaired  Cognition Overall Cognitive Status: Impaired/Different from baseline Arousal/Alertness: Awake/alert Orientation Level: Oriented X4 Year: 2022 Attention: Focused;Sustained Focused Attention: Appears intact Sustained Attention: Appears intact Memory: Impaired Memory Impairment: Retrieval deficit;Storage deficit Awareness: Impaired Awareness Impairment: Anticipatory impairment Problem Solving: Impaired Safety/Judgment: Impaired Sensation Sensation Light Touch: Appears Intact Proprioception: Impaired Detail Proprioception Impaired Details: Impaired RUE;Impaired RLE Coordination Gross Motor Movements are Fluid and Coordinated: No Fine Motor Movements are Fluid and Coordinated: No Coordination and Movement Description: impaired 2/2 R hemi Heel Shin Test: impaired RLE Motor  Motor Motor: Hemiplegia;Abnormal postural alignment and control Motor - Skilled Clinical Observations: R hemi  Trunk/Postural Assessment  Cervical Assessment Cervical Assessment: Exceptions to [facility] (forward head) Thoracic Assessment Thoracic Assessment: Exceptions to [facility] (rounded shoulders) Lumbar Assessment Lumbar Assessment: Exceptions to [facility] (posterior pelvic tilt) Postural Control Postural Control: Deficits on evaluation Righting Reactions: impaired/delayed  Balance Balance Balance Assessed: Yes Standardized Balance Assessment Standardized Balance Assessment: Berg Balance Test Berg Balance Test Sit to Stand: Needs minimal aid to stand or to stabilize Standing Unsupported: Able to stand 2 minutes with supervision Sitting with Back Unsupported but Feet Supported on Floor or Stool: Able to sit safely and securely 2 minutes Stand to Sit: Sits independently, has uncontrolled descent Transfers: Needs one person to assist Standing Unsupported with Eyes Closed: Able  to stand 10 seconds with supervision Standing Ubsupported with Feet Together: Able to place feet together independently and stand for 1 minute with supervision From Standing, Reach Forward with Outstretched Arm: Loses balance while trying/requires external support From Standing Position, Pick up Object from Floor: Unable to try/needs assist to keep balance From Standing Position, Turn to Look Behind Over each Shoulder: Needs supervision when turning Turn 360 Degrees: Needs assistance while turning Standing Unsupported, Alternately Place Feet on Step/Stool: Needs assistance to keep from falling or unable to try Standing Unsupported, One Foot in Front: Needs help to step but can hold 15 seconds Standing on One Leg: Tries to lift leg/unable to hold 3 seconds but remains standing independently Total Score: 19 Static Sitting Balance Static Sitting - Balance Support: No upper extremity supported;Feet supported Static Sitting - Level of Assistance: 5: Stand  by assistance Dynamic Sitting Balance Dynamic Sitting - Balance Support: No upper extremity supported;Feet supported;During functional activity Dynamic Sitting - Level of Assistance: 4: Min assist Static Standing Balance Static Standing - Balance Support: Bilateral upper extremity supported;During functional activity Static Standing - Level of Assistance: 4: Min assist Dynamic Standing Balance Dynamic Standing - Balance Support: Bilateral upper extremity supported;During functional activity Dynamic Standing - Level of Assistance: 4: Min assist Extremity Assessment   RLE Assessment RLE Assessment: Exceptions to [facility] Passive Range of Motion (PROM) Comments: WFL General Strength Comments: impaired, see below RLE Strength Right Hip Flexion: 4+/5 Right Knee Flexion: 4+/5 Right Knee Extension: 4+/5 Right Ankle Dorsiflexion: 3/5 Right Ankle Plantar Flexion: 3/5 LLE Assessment LLE Assessment: Within Functional Limits Passive Range of Motion  (PROM) Comments: WFL General Strength Comments: 5/5 grossly  Care Tool Care Tool Bed Mobility Roll left and right activity   Roll left and right assist level: Contact Guard/Touching assist    Sit to lying activity   Sit to lying assist level: Contact Guard/Touching assist    Lying to sitting on side of bed activity   Lying to sitting on side of bed assist level: the ability to move from lying on the back to sitting on the side of the bed with no back support.: Contact Guard/Touching assist     Care Tool Transfers Sit to stand transfer   Sit to stand assist level: Minimal Assistance - Patient > 75%    Chair/bed transfer   Chair/bed transfer assist level: Minimal Assistance - Patient > 75%     Toilet transfer   Assist Level: Minimal Assistance - Patient > 75%    Car transfer   Car transfer assist level: Minimal Assistance - Patient > 75%      Care Tool Locomotion Ambulation   Assist level: Minimal Assistance - Patient > 75% Assistive device: Walker-rolling Max distance: 150'  Walk 10 feet activity   Assist level: Minimal Assistance - Patient > 75% Assistive device: Walker-rolling   Walk 50 feet with 2 turns activity   Assist level: Minimal Assistance - Patient > 75% Assistive device: Walker-rolling  Walk 150 feet activity   Assist level: Minimal Assistance - Patient > 75% Assistive device: Walker-rolling  Walk 10 feet on uneven surfaces activity Walk 10 feet on uneven surfaces activity did not occur: Safety/medical concerns      Stairs   Assist level: Minimal Assistance - Patient > 75% Stairs assistive device: 2 hand rails Max number of stairs: 8 (6")  Walk up/down 1 step activity   Walk up/down 1 step (curb) assist level: Minimal Assistance - Patient > 75% Walk up/down 1 step or curb assistive device: 2 hand rails  Walk up/down 4 steps activity   Walk up/down 4 steps assist level: Minimal Assistance - Patient > 75% Walk up/down 4 steps assistive device: 2 hand  rails  Walk up/down 12 steps activity Walk up/down 12 steps activity did not occur: Safety/medical concerns      Pick up small objects from floor   Pick up small object from the floor assist level: Minimal Assistance - Patient > 75%    Wheelchair Is the patient using a wheelchair?: No Type of Wheelchair: Manual   Wheelchair assist level: Dependent - Patient 0% Max wheelchair distance: 150'  Wheel 50 feet with 2 turns activity   Assist Level: Dependent - Patient 0%  Wheel 150 feet activity   Assist Level: Dependent - Patient 0%    Refer to Care Plan  for Long Term Goals  SHORT TERM GOAL WEEK 1 PT Short Term Goal 1 (Week 1): =LTG due to ELOS  Recommendations for other services: None   Skilled Therapeutic Intervention Evaluation completed (see details above and below) with education on PT POC and goals and individual treatment initiated with focus on bed mobility, functional transfer assessment, gait assessment, and orientation to rehab unit and schedule. Pt received seated in bed, no complaints of pain. Pt agreeable to PT evaluation. Supine to/from sit on flat bed at CGA level. Squat pivot transfer to w/c with min A. Sit to stand with min A to RW, cues for safe UE placement. Ambulation x 150 ft with RW and min A for balance, path deviation to the R and decreased B step length. Car transfer with min A with some assist needed for RLE management and cues for safe transfer technique. Ascend/descend 8 x 6" stairs with 2 handrails and min A, decreased RLE control and foot drop noted. Patient demonstrates increased fall risk as noted by score of 19/56 on Berg Balance Scale.  (<36= high risk for falls, close to 100%; 37-45 significant >80%; 46-51 moderate >50%; 52-55 lower >25%). Reviewed score and functional implications. Pt left seated in w/c in room with needs in reach, chair alarm in place at end of session.  Mobility Bed Mobility Bed Mobility: Rolling Right;Rolling Left;Supine to Sit;Sit to  Supine Rolling Right: Contact Guard/Touching assist Rolling Left: Contact Guard/Touching assist Supine to Sit: Contact Guard/Touching assist Sit to Supine: Contact Guard/Touching assist Transfers Transfers: Sit to Stand;Stand Pivot Transfers Sit to Stand: Minimal Assistance - Patient > 75% Stand Pivot Transfers: Minimal Assistance - Patient > 75% Stand Pivot Transfer Details: Verbal cues for sequencing;Verbal cues for technique;Verbal cues for precautions/safety;Manual facilitation for weight shifting Transfer (Assistive device): None Locomotion  Gait Gait Distance (Feet): 150 Feet Assistive device: Rolling walker Gait Gait Pattern: Impaired (path deviation to the R, dec B step length) Gait velocity: decreased Stairs / Additional Locomotion Stairs: Yes Stairs Assistance: Minimal Assistance - Patient > 75% Stair Management Technique: Two rails;Step to pattern Number of Stairs: 8 Height of Stairs: 6 Wheelchair Mobility Wheelchair Mobility: No   Discharge Criteria: Patient will be discharged from PT if patient refuses treatment 3 consecutive times without medical reason, if treatment goals not met, if there is a change in medical status, if patient makes no progress towards goals or if patient is discharged from hospital.  The above assessment, treatment plan, treatment alternatives and goals were discussed and mutually agreed upon: by patient   Excell Seltzer, PT, DPT, CSRS 10/09/2021, 12:19 PM

## 2021-10-10 DIAGNOSIS — I63522 Cerebral infarction due to unspecified occlusion or stenosis of left anterior cerebral artery: Secondary | ICD-10-CM | POA: Diagnosis not present

## 2021-10-10 MED ORDER — MAGNESIUM GLUCONATE 500 MG PO TABS
250.0000 mg | ORAL_TABLET | Freq: Every day | ORAL | Status: DC
  Administered 2021-10-10 – 2021-10-15 (×6): 250 mg via ORAL
  Filled 2021-10-10 (×6): qty 1

## 2021-10-10 NOTE — Progress Notes (Signed)
Physical Therapy Session Note  Patient Details  Name: Andrea Williamson MRN: [id number] Date of Birth: 03-Jan-1951  Today's Date: 10/10/2021 PT Individual Time: 1412-1507 PT Individual Time Calculation (min): 55 min   Short Term Goals: Week 1:  PT Short Term Goal 1 (Week 1): =LTG due to ELOS  Skilled Therapeutic Interventions/Progress Updates:    Pt received supine in bed and agreeable to therapy session. Supine>sitting R EOB, HOB partially elevated and using bedrail, with close supervision for safety. Sit<>stands using RW with CGA for steadying during session. Gait training ~4ft into bathroom using RW with light min assist - pt demos lack of sustained L weight shift to allow R LE advancement during swing causing short steps on R side as well as poor AD management keeping it shifted towards her L side causing her to hit her R foot on the back R leg of RW. Standing with light min assist performed LB clothing management with min assist - continent of bladder and performed seated peri-care without assist. Standing at sink performed hand hygiene with CGA for safety.   Gait training ~167ft to main therapy gym using RW with min assist for balance due to pt continuing to have poor AD management and decreased R LE step length and foot clearance causing her to kick the back R leg of RW with her R LE - cuing for improvement.   Gait training ~132ft + stairs + ~17ft, no AD, without a break, min assist for balance - continues to demo almost a "waddle" type walk with excessive R/L weight shift with decreased R LE stance time and step length. Decreased gait speed with decreased B LE step lengths (pt reports she took short steps at baseline). Pt has slight L lateral trunk flexion throughout gait contributing to the increased R/L postural sway - with fatigue has progressively worsening anterior trunk lean. Stair navigation ascending/descending 4, 6" steps, 2x using B HRs - cuing for reciprocal pattern in both directions  targeting R LE NMR; however, pt with difficulty following this command and frequently performing step-to pattern. Performed the same task of walking with stair navigation in between with pt demoing improved upright posture and this time is able to complete stairs with reciprocal pattern consistently and continued min assist.  Dynamic gait training, no AD, including forward/backwards walking and R/L side stepping ~49ft each - requires min assist for balance - has slower R lateral step initiation and speed of movement compared to L LE - has repeated R posterior LOB when walking backwards requiring heavier min assist to maintain upright.   Pt fatigues quickly with each activity requiring seated rest breaks throughout - HR 77bpm.   Gait training ~319ft throughout CIR then back to room, no AD, with progressively heavier min assist and pt continuing to demo above gait deviations - decreased gait speed, decreased B LE step lengths (R more impaired than L), quick R/L weight shift, L lateral trunk flexion, and progressively worsening anterior lean when fatigued.   Sit>supine supervision using bedrail. Pt left supine in bed with needs in reach and bed alarm on.     Therapy Documentation Precautions:  Precautions Precautions: Fall Precaution Comments: watch HR - periods of tachycardia per acute, mild R hemi Restrictions Weight Bearing Restrictions: No   Pain:  Denies pain during session.   Therapy/Group: Individual Therapy  Tawana Scale , PT, DPT, NCS, CSRS  10/10/2021, 12:24 PM

## 2021-10-10 NOTE — Progress Notes (Signed)
Occupational Therapy Session Note  Patient Details  Name: Andrea Williamson MRN: [id number] Date of Birth: 1951/02/23  Today's Date: 10/11/2021 OT Individual Time: 1434-1530 OT Individual Time Calculation (min): 56 min   Short Term Goals: Week 1:  OT Short Term Goal 1 (Week 1): STGs = LTGs 2/2 ELOS at Supervision  Skilled Therapeutic Interventions/Progress Updates:    Pt greeted in bed with no c/o pain, ADL needs reportedly met. She used the RW with CGA-close supervision to ambulate to the dayroom. Noted that pt would kick the Rt leg of her walker and stand in closer proximity to the Rt side vs positioning her body neutrally inside of device. Cues to address this with minimal effort to correct from pt. Once in the dayroom, worked on standing endurance and Rt NMR while assembling a jigsaw puzzle. Pt stated this used to be a hobby of hers. Also stated that she was right hand dominant but was using her Lt hand at dominant level to meet task demands. Cues for increasing functional use of the Rt hand which appeared tremulous, however pt adamant that it was "just the right leg" that was "useless." Refused to try using the Rt hand to select puzzle pieces. Noted increased in Rt lean and Rt knee flexion with onset of standing fatigue however pt refused to sit to rest for a bit. Longest standing window without rest >20 minutes. Note that she needed assistance with self organization when assembling the puzzle pieces, did well including the Rt with bimanual demands (I.e. putting pieces together and scooping them together in a group). She then ambulated back to the room and returned to bed. OT placed music and guided her through exercises/ROM for both LEs and UEs. Pt reported feeling "worn out" at end of session. Left her in bed with all needs within reach and bed alarm set.   Therapy Documentation Precautions:  Precautions Precautions: Fall Precaution Comments: watch HR - periods of tachycardia per acute, mild R  hemi Restrictions Weight Bearing Restrictions: No  ADL: ADL Eating: Not assessed Grooming: Setup Upper Body Bathing: Supervision/safety Lower Body Bathing: Minimal assistance Upper Body Dressing: Supervision/safety Lower Body Dressing: Minimal assistance Toileting: Contact guard Toilet Transfer: Contact guard Toilet Transfer Method: Stand pivot  Therapy/Group: Individual Therapy  Vylet Maffia A Asusena Sigley 10/11/2021, 4:08 PM

## 2021-10-10 NOTE — Progress Notes (Signed)
PROGRESS NOTE   Subjective/Complaints: No complaints. Tolerated therapy well today  Slept well last night Incontinent overnight  ROS:  Pt denies SOB, abd pain, CP, N/V/C/D, and vision changes, insomnia  Objective:   No results found. Recent Labs    10/08/21 0516  WBC 7.9  HGB 14.1  HCT 42.0  PLT 259   Recent Labs    10/08/21 0516 10/09/21 0516  NA 136 137  K 3.9 4.1  CL 107 107  CO2 21* 22  GLUCOSE 105* 128*  BUN 41* 31*  CREATININE 0.90 1.06*  CALCIUM 9.4 9.4    Intake/Output Summary (Last 24 hours) at 10/10/2021 1923 Last data filed at 10/10/2021 1844 Gross per 24 hour  Intake 600 ml  Output --  Net 600 ml        Physical Exam: Vital Signs Blood pressure (!) 166/71, pulse 61, temperature 98.4 F (36.9 C), resp. rate 17, height 5\' 3"  (1.6 m), weight 72 kg, SpO2 99 %. Gen: no distress, normal appearing HEENT: oral mucosa pink and moist, NCAT Cardio: Reg rate Chest: normal effort, normal rate of breathing Abd: soft, non-distended Ext: no edema Psych: pleasant, normal affect Skin: No evidence of breakdown, no evidence of rash Neurologic: Cranial nerves II through XII intact, motor strength is 5/5 in bilateral deltoid, bicep, tricep, grip, 3/5 RIgh tand 5/5 Left hip flexor, knee extensors, ankle dorsiflexor and plantar flexor Sensory exam normal sensation to light touch and proprioception in bilateral upper and lower extremities Cerebellar exam mild bilateral intention tremor with  finger to nose to finger Musculoskeletal: Full range of motion in all 4 extremities. No joint swelling Assessment/Plan: 1. Functional deficits which require 3+ hours per day of interdisciplinary therapy in a comprehensive inpatient rehab setting. Physiatrist is providing close team supervision and 24 hour management of active medical problems listed below. Physiatrist and rehab team continue to assess barriers to  discharge/monitor patient progress toward functional and medical goals  Care Tool:  Bathing    Body parts bathed by patient: Right arm, Left arm, Chest, Abdomen, Front perineal area, Buttocks, Right upper leg, Left upper leg, Face, Right lower leg, Left lower leg   Body parts bathed by helper: Right lower leg, Left lower leg     Bathing assist Assist Level: Minimal Assistance - Patient > 75% (Min A for dynamic balance)     Upper Body Dressing/Undressing Upper body dressing   What is the patient wearing?: Pull over shirt    Upper body assist Assist Level: Contact Guard/Touching assist    Lower Body Dressing/Undressing Lower body dressing      What is the patient wearing?: Pants, Incontinence brief     Lower body assist Assist for lower body dressing: Minimal Assistance - Patient > 75%     Toileting Toileting    Toileting assist Assist for toileting: Moderate Assistance - Patient 50 - 74%     Transfers Chair/bed transfer  Transfers assist     Chair/bed transfer assist level: Minimal Assistance - Patient > 75%     Locomotion Ambulation   Ambulation assist      Assist level: Minimal Assistance - Patient > 75% Assistive device: No Device  Max distance: 350ft   Walk 10 feet activity   Assist     Assist level: Minimal Assistance - Patient > 75% Assistive device: Walker-rolling   Walk 50 feet activity   Assist    Assist level: Minimal Assistance - Patient > 75% Assistive device: Walker-rolling    Walk 150 feet activity   Assist    Assist level: Minimal Assistance - Patient > 75% Assistive device: Walker-rolling    Walk 10 feet on uneven surface  activity   Assist Walk 10 feet on uneven surfaces activity did not occur: Safety/medical concerns         Wheelchair     Assist Is the patient using a wheelchair?: No Type of Wheelchair: Manual    Wheelchair assist level: Dependent - Patient 0% Max wheelchair distance: 150'     Wheelchair 50 feet with 2 turns activity    Assist        Assist Level: Dependent - Patient 0%   Wheelchair 150 feet activity     Assist      Assist Level: Dependent - Patient 0%   Blood pressure (!) 166/71, pulse 61, temperature 98.4 F (36.9 C), resp. rate 17, height 5\' 3"  (1.6 m), weight 72 kg, SpO2 99 %.  Medical Problem List and Plan: 1. 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  2.  Antithrombotics: -DVT/anticoagulation:  Pharmaceutical: Lovenox             -antiplatelet therapy: ASA/Brilinta.  3. Pain Management: Tylenol prn. 11/24- denies pain- con't regimen  4. Mood: LCSW to follow for evaluation and support.              -antipsychotic agents: N/A 5. Neuropsych: This patient is capable of making decisions on her own behalf. 6. Skin/Wound Care: Routine pressure relief measures.  7. Fluids/Electrolytes/Nutrition: Monitor I/O. Check CMET in am. 8. Dyslipidemia: ON Lipitor daily.' 9. Constipation: Resolved and on Senna S 11/24- LBM 2 days ago- con't regimen   11/25- LBM yesterday- con't regimen 10. Tobacco abuse: Continue nicotine patch.  11. Azotemia  11/24- BUN 41 up from 28- will recheck in AM and push fluids. 11/25- BUN down from 41 to 31 and Cr 1.06- will recheck Monday and monitor  11/26: placed nursing order to encourage 6-8 glasses of water per day 12. HTN: add magnesium gluconate 250mg  HS 13. Overweight BMI 28.12: provide dietary education.     LOS: 3 days A FACE TO FACE EVALUATION WAS PERFORMED  Clide Deutscher Madelene Kaatz 10/10/2021, 7:23 PM

## 2021-10-10 NOTE — Progress Notes (Signed)
Occupational Therapy Session Note  Patient Details  Name: [name] MRN: [id number] Date of Birth: 26-Feb-1951   Today's Date: 10/10/2021 OT Individual Time: 8372-9021 OT Individual Time Calculation (min): 69 min    Short Term Goals: Week 1:  OT Short Term Goal 1 (Week 1): STGs = LTGs 2/2 ELOS at Supervision   Skilled Therapeutic Interventions/Progress Updates:    Pt greeted at time of session bed level with husband present who remained throughout session. No pain reported. Focus of session on shower level bathing with pt completing bed mobility CGA, ambulating to/from bathroom with Min guard and RW, cues for walker proximity. Shower bench transfer CGA and cues for sequencing. Pt feeling "a little" dizzy at this time, checked VS and all WNL. Symptoms subsided with rest. Covered IV site on LUE and R thigh area already with waterproof covering. Pt performed UB/LB bathing with Min A only for dynamic balance during activity, otherwise CGA seated on bench. Dried off same manner and performed UB/LB dressing wheelchair level with Min A only for brief/pants to fully pull up in the back. Rest breaks provided PRN. Remainder of session transport room <> ortho gym and performed SCIFIT on level 3 for 5 minutes for UB strengthening and global cardio endurance. Back in room, set up alarm on call bell in reach.    Therapy Documentation Precautions:  Precautions Precautions: Fall Precaution Comments: watch HR - periods of tachycardia per acute, mild R hemi Restrictions Weight Bearing Restrictions: No     Therapy/Group: Individual Therapy  [name] 10/10/2021, 7:17 AM

## 2021-10-10 NOTE — Progress Notes (Signed)
Occupational Therapy Session Note  Patient Details  Name: Andrea Williamson MRN: [id number] Date of Birth: Aug 22, 1951  Today's Date: 10/10/2021 OT Individual Time: 1194-1740 OT Individual Time Calculation (min): 25 min    Short Term Goals: Week 1:  OT Short Term Goal 1 (Week 1): STGs = LTGs 2/2 ELOS at Supervision  Skilled Therapeutic Interventions/Progress Updates:    Pt received semi-reclined in bed, denies pain, agreeable to therapy. Session focus on activity tolerance, RUE NMR, transfer retraining, static/dynamic standing balance in prep for improved ADL/IADL/func mobility performance + decreased caregiver burden. Came to sitting EOB with close S. Short distance ambulation throughout session with light min A to manage RW. Total A w/c transporto to and from gym for time management.   Stood to play 3 rounds of corn hole with one instance of min A to regain balance after small R LOB. Able to retrieve bags from knee height placed on mat behind her. Able to toss with RUE but noted intention tremor / able to keep track of points each round and total appropriately. Practiced tossing and catching ball from various distances with BUE ; difficulty when attempting only with RUE.   Pt left semi-reclined in bed with bed alarm engaged, call bell in reach, and all immediate needs met.    Therapy Documentation Precautions:  Precautions Precautions: Fall Precaution Comments: watch HR - periods of tachycardia per acute, mild R hemi Restrictions Weight Bearing Restrictions: No  Pain:   denies ADL: See Care Tool for more details.   Therapy/Group: Individual Therapy  Volanda Napoleon MS, OTR/L  10/10/2021, 12:32 PM

## 2021-10-10 NOTE — Progress Notes (Signed)
Slept good. Incontinent of urine X 1 this shift. Did not alert staff she was incontinent. Husband at bedside. Andrea Williamson A

## 2021-10-11 NOTE — Progress Notes (Signed)
Physical Therapy Session Note  Patient Details  Name: Andrea Williamson MRN: [id number] Date of Birth: 26-Mar-1951  Today's Date: 10/11/2021 PT Individual Time: 0800-0900; 1600-1640 PT Individual Time Calculation (min): 60 min and 40 min PT Missed Time: 20 min Missed Time Reason: fatigue  Short Term Goals: Week 1:  PT Short Term Goal 1 (Week 1): =LTG due to ELOS  Skilled Therapeutic Interventions/Progress Updates:    Session 1: Pt received asleep in bed, arousable and agreeable to PT session. No complaints of pain. Bed mobility Supervision with HOB slightly elevated and use of bedrail. Pt is setup A to don pants and change shirt at bed level as well as don shoes. Sit to stand with CGA with and without AD during session, cues for safe UE placement during transfer. Ambulatory transfer into bathroom with no AD and min A for balance, decreased attention to RLE with some dragging of limb noted. Toilet transfer with CGA with use of grab bar. Pt is independent for clothing management and pericare. Ambulation up to 200 ft during session without AD and min A for balance, cues for R heel strike and increased attention to RLE during gait. Pt tends to not pay attention to RLE which gets caught up behind her and causes LOB. Pt with poor insight into her deficits and is unfazed by several near LOB during session. Standing alt L/R cone taps with min RUE HHA x 10 reps, impaired RLE coordination as compared to LLE. Addition of RW for improved balance with cone taps, pt exhibits improved LE control and coordination with BUE support for balance. Standing alt L/R 6" step-ups with BUE support and min A for balance, 2 x 10 reps with focus on eccentric control with RLE when descending step. Standing squats for LE strengthening x 20 reps with RW and CGA for balance. Pt reports some dizziness during standing tasks that resolves with seated rest break, unable to further describe. Pt returned to bed at end of session, Supervision for  bed mobility. Pt left seated in bed with needs in reach, bed alarm in place.  Session 2: Pt received supine in bed, agreeable with encouragement to participate in therapy session. No complaints of pain. Bed mobility Supervision with use of bedrail. Pt is setup A to don shoes while seated EOB. Sit to stand with min A and no AD throughout session. Ambulation up to 200 ft with no AD and min A for balance, focus on attention to RLE and increasing heel strike and foot clearance during gait. Session focus on scanning environment to the R to find targets as well as safety awareness while scanning as pt tends to catch RLE on tripping hazards including cords, chair legs, etc. Pt with poor insight into deficits and poor safety awareness and frequently states, "I won't trip" or "I won't fall" but then requires min A and max cueing to avoid a fall. Pt exhibits some RLE inattention as well as inattention to obstacles in R visual field. BITS visual scanning task (complex array), pt scores 3.02 reaction time with 89.66% accuracy, visual pursuit task (rotator) with 3.31 sec reaction time and 92.86% accuracy. Focus on use of RUE with BITS task and scanning environment. Pt requests to return to room due to fatigue, assist back to bed at Supervision level. Pt left semi-reclined in bed with needs in reach, bed alarm in place. Pt missed 20 min of scheduled therapy session due to fatigue.   Therapy Documentation Precautions:  Precautions Precautions: Fall Precaution Comments:  watch HR - periods of tachycardia per acute, mild R hemi Restrictions Weight Bearing Restrictions: No    Therapy/Group: Individual Therapy   Excell Seltzer, PT, DPT, CSRS  10/11/2021, 12:00 PM

## 2021-10-11 NOTE — Progress Notes (Signed)
Incontinent of urine X 2 this shift. Offered to toilet patient, to keep dry. She said she would call. Encouraged patient and spoke with husband about patient increasing H20 intake. "Ok". Denies pain. No unsafe behaviors observed. Patrici Ranks A

## 2021-10-12 DIAGNOSIS — I63522 Cerebral infarction due to unspecified occlusion or stenosis of left anterior cerebral artery: Secondary | ICD-10-CM | POA: Diagnosis not present

## 2021-10-12 LAB — CBC
HCT: 37.8 % (ref 36.0–46.0)
Hemoglobin: 13 g/dL (ref 12.0–15.0)
MCH: 32.7 pg (ref 26.0–34.0)
MCHC: 34.4 g/dL (ref 30.0–36.0)
MCV: 95.2 fL (ref 80.0–100.0)
Platelets: 245 10*3/uL (ref 150–400)
RBC: 3.97 MIL/uL (ref 3.87–5.11)
RDW: 12.4 % (ref 11.5–15.5)
WBC: 9.3 10*3/uL (ref 4.0–10.5)
nRBC: 0 % (ref 0.0–0.2)

## 2021-10-12 LAB — BASIC METABOLIC PANEL
Anion gap: 7 (ref 5–15)
BUN: 26 mg/dL — ABNORMAL HIGH (ref 8–23)
CO2: 23 mmol/L (ref 22–32)
Calcium: 9.3 mg/dL (ref 8.9–10.3)
Chloride: 106 mmol/L (ref 98–111)
Creatinine, Ser: 0.94 mg/dL (ref 0.44–1.00)
GFR, Estimated: >60 mL/min (ref >60)
Glucose, Bld: 99 mg/dL (ref 70–99)
Potassium: 3.9 mmol/L (ref 3.5–5.1)
Sodium: 136 mmol/L (ref 135–145)

## 2021-10-12 NOTE — Progress Notes (Signed)
PROGRESS NOTE   Subjective/Complaints:  Pt reports no issues- is very direct/to the point and a dearth of words in her answers- per husband, this is baseline for her.   Slept well- LBM yesterday (correct per nursing notes)- has some incontinence and continence- Says doing well with therapy.   ROS:  Pt denies SOB, abd pain, CP, N/V/C/D, and vision changes   Objective:   No results found. Recent Labs    10/12/21 0610  WBC 9.3  HGB 13.0  HCT 37.8  PLT 245   Recent Labs    10/12/21 0610  NA 136  K 3.9  CL 106  CO2 23  GLUCOSE 99  BUN 26*  CREATININE 0.94  CALCIUM 9.3    Intake/Output Summary (Last 24 hours) at 10/12/2021 1031 Last data filed at 10/11/2021 2200 Gross per 24 hour  Intake 360 ml  Output --  Net 360 ml        Physical Exam: Vital Signs Blood pressure 136/68, pulse (!) 43, temperature 98.4 F (36.9 C), temperature source Oral, resp. rate 18, height 5\' 3"  (1.6 m), weight 72 kg, SpO2 90 %.    General: awake, alert, appropriate, sitting up in bed; husband at bedside; NAD HENT: conjugate gaze; oropharynx moist CV: bradycardic rate; no JVD Pulmonary: CTA B/L; no W/R/R- good air movement GI: soft, NT, ND, (+)BS Psychiatric: appropriate; flat; per husband at baseline Neurological: alert  Skin: No evidence of breakdown, no evidence of rash Neurologic: Cranial nerves II through XII intact, motor strength is 5/5 in bilateral deltoid, bicep, tricep, grip, 3/5 RIgh tand 5/5 Left hip flexor, knee extensors, ankle dorsiflexor and plantar flexor Sensory exam normal sensation to light touch and proprioception in bilateral upper and lower extremities Cerebellar exam mild bilateral intention tremor with  finger to nose to finger Musculoskeletal: Full range of motion in all 4 extremities. No joint swelling Assessment/Plan: 1. Functional deficits which require 3+ hours per day of interdisciplinary  therapy in a comprehensive inpatient rehab setting. Physiatrist is providing close team supervision and 24 hour management of active medical problems listed below. Physiatrist and rehab team continue to assess barriers to discharge/monitor patient progress toward functional and medical goals  Care Tool:  Bathing    Body parts bathed by patient: Right arm, Left arm, Chest, Abdomen, Front perineal area, Buttocks, Right upper leg, Left upper leg, Face, Right lower leg, Left lower leg   Body parts bathed by helper: Right lower leg, Left lower leg     Bathing assist Assist Level: Minimal Assistance - Patient > 75% (Min A for dynamic balance)     Upper Body Dressing/Undressing Upper body dressing   What is the patient wearing?: Pull over shirt    Upper body assist Assist Level: Contact Guard/Touching assist    Lower Body Dressing/Undressing Lower body dressing      What is the patient wearing?: Pants, Incontinence brief     Lower body assist Assist for lower body dressing: Minimal Assistance - Patient > 75%     Toileting Toileting    Toileting assist Assist for toileting: Moderate Assistance - Patient 50 - 74%     Transfers Chair/bed transfer  Transfers  assist     Chair/bed transfer assist level: Contact Guard/Touching assist     Locomotion Ambulation   Ambulation assist      Assist level: Minimal Assistance - Patient > 75% Assistive device: No Device Max distance: 200'   Walk 10 feet activity   Assist     Assist level: Minimal Assistance - Patient > 75% Assistive device: No Device   Walk 50 feet activity   Assist    Assist level: Minimal Assistance - Patient > 75% Assistive device: No Device    Walk 150 feet activity   Assist    Assist level: Minimal Assistance - Patient > 75% Assistive device: No Device    Walk 10 feet on uneven surface  activity   Assist Walk 10 feet on uneven surfaces activity did not occur: Safety/medical  concerns         Wheelchair     Assist Is the patient using a wheelchair?: Yes Type of Wheelchair: Manual    Wheelchair assist level: Dependent - Patient 0% Max wheelchair distance: 150'    Wheelchair 50 feet with 2 turns activity    Assist        Assist Level: Dependent - Patient 0%   Wheelchair 150 feet activity     Assist      Assist Level: Dependent - Patient 0%   Blood pressure 136/68, pulse (!) 43, temperature 98.4 F (36.9 C), temperature source Oral, resp. rate 18, height 5\' 3"  (1.6 m), weight 72 kg, SpO2 90 %.  Medical Problem List and Plan: 1. 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  2.  Antithrombotics: -DVT/anticoagulation:  Pharmaceutical: Lovenox             -antiplatelet therapy: ASA/Brilinta.  3. Pain Management: Tylenol prn. 11/24- denies pain- con't regimen  4. Mood: LCSW to follow for evaluation and support.              -antipsychotic agents: N/A 5. Neuropsych: This patient is capable of making decisions on her own behalf. 6. Skin/Wound Care: Routine pressure relief measures.  7. Fluids/Electrolytes/Nutrition: Monitor I/O. Check CMET in am. 8. Dyslipidemia: ON Lipitor daily.' 9. Constipation: Resolved and on Senna S 11/24- LBM 2 days ago- con't regimen   11/25- LBM yesterday- con't regimen  11/28- LBM yesterday- con't regimen 10. Tobacco abuse: Continue nicotine patch.  11. Azotemia  11/24- BUN 41 up from 28- will recheck in AM and push fluids. 11/25- BUN down from 41 to 31 and Cr 1.06- will recheck Monday and monitor  11/26: placed nursing order to encourage 6-8 glasses of water per day 11/28- BUN down to 26- Cr 0.94- con't pushing fluids 12. HTN: add magnesium gluconate 250mg  HS 13. Overweight BMI 28.12: provide dietary education.     LOS: 5 days A  FACE TO FACE EVALUATION WAS PERFORMED  Landy Dunnavant 10/12/2021, 10:31 AM

## 2021-10-12 NOTE — Progress Notes (Signed)
Continent and incontinent of bladder. LBM 11/27. Encouraged patient to increase PO intake. Husband at bedside. Patrici Ranks A

## 2021-10-12 NOTE — Progress Notes (Addendum)
Physical Therapy Session Note  Patient Details  Name: Andrea Williamson MRN: [id number] Date of Birth: 1951-09-15  Today's Date: 10/12/2021 PT Individual Time: 1400-1500 PT Individual Time Calculation (min): 60 min   Short Term Goals: Week 1:  PT Short Term Goal 1 (Week 1): =LTG due to ELOS Week 2:    Week 3:     Skilled Therapeutic Interventions/Progress Updates:    Pain - pt denies pain  Supine to sit w/supervision. Dons shoes w/set up, additional time for R. Gait 122ft w/cga no AD, decreased stance time R, decreased attention to R environment. Standing reaching to R w RUE to promote wbing RLE.  Standing w/feet together - cga x 30 sec Standing tandem - min assist for balance d/t increased sway, delayed reactions Standing high march x 20 w/cga Standing alternating llateral lunges - cues to increase step width RLE, cga Standing alternating forward lunges - mod assist, decreased step length RLE, increased difficulty reversing w/RLE, fails to maintain straight path of lunge progressively rotating to R  Standing tapping 4in step w/2.5lb ankle wt w/emphasis on controlled placement, pt 20% of time fails to fully lift foot from step to floor, min assist for balance, cues to attend to foot.  Pt states "yeah, I know exactly where my foot is"  Standing lateral tapping of 4in step w/2.5 ankle wt, worked on increased Zeb wt shift to L to facilitate controlled placement.  Again difficulty w/stepping off of step at least 40% steps.  Supine bridge R foot bias position w/hip adduction using ball between knees 2 x 12 , w/manual abd resistance for R glut activation 2x12  Sidelying clamshells R hip 2x 15 w/cues for increasing AROM  Gait 18ft to room as above.  Commode transfer w/cga, cues to attend to R and to fully back to commode, cga for balance w/clothing management and hygiene, cues for safety.  Sit to supine w/cues to attend to RLE/left it slightly off of the bed, unaware of this.   Pt  left supine w/rails up x 3, alarm set, bed in lowest position, and needs in reach.  Pt w/decreased acceptance of and awareness into deficits as well as resistance to activities which challenge her deficits.    Therapy Documentation Precautions:  Precautions Precautions: Fall Precaution Comments: watch HR - periods of tachycardia per acute, mild R hemi Restrictions Weight Bearing Restrictions: No     Therapy/Group: Individual Therapy Callie Fielding, Colp 10/12/2021, 3:01 PM

## 2021-10-12 NOTE — Care Management (Signed)
[facility] Individual Statement of Services  Patient Name:  NAILANI FULL  Date:  10/12/2021  Welcome to the [facility].  Our goal is to provide you with an individualized program based on your diagnosis and situation, designed to meet your specific needs.  With this comprehensive rehabilitation program, you will be expected to participate in at least 3 hours of rehabilitation therapies Monday-Friday, with modified therapy programming on the weekends.  Your rehabilitation program will include the following services:  Physical Therapy (PT), Occupational Therapy (OT), Speech Therapy (ST), 24 hour per day rehabilitation nursing, Therapeutic Recreaction (TR), Psychology, Neuropsychology, Care Coordinator, Rehabilitation Medicine, Mission, and Other  Weekly team conferences will be held on Tuesdays to discuss your progress.  Your Inpatient Rehabilitation Care Coordinator will talk with you frequently to get your input and to update you on team discussions.  Team conferences with you and your family in attendance may also be held.  Expected length of stay: 7-10 days     Overall anticipated outcome: Supervision  Depending on your progress and recovery, your program may change. Your Inpatient Rehabilitation Care Coordinator will coordinate services and will keep you informed of any changes. Your Inpatient Rehabilitation Care Coordinator's name and contact numbers are listed  below.  The following services may also be recommended but are not provided by the [facility] will be made to provide these services after discharge if needed.  Arrangements include referral to agencies that provide these services.  Your insurance has been verified to be:  [facility] Medicare  Your primary  doctor is:  No PCP listed  Pertinent information will be shared with your doctor and your insurance company.  Inpatient Rehabilitation Care Coordinator:  Cathleen Corti [phone] or ([phone]  Information discussed with and copy given to patient by: Rana Snare, 10/12/2021, 12:14 PM

## 2021-10-12 NOTE — Progress Notes (Signed)
Speech Language Pathology Daily Session Note  Patient Details  Name: Andrea Williamson MRN: [id number] Date of Birth: August 05, 1951  Today's Date: 10/12/2021 SLP Individual Time: 1031-1110 SLP Individual Time Calculation (min): 39 min  Short Term Goals: Week 1: SLP Short Term Goal 1 (Week 1): Patient will perform simulated medication organization task (pill box) with supervision A for accuracy. SLP Short Term Goal 2 (Week 1): Patient will demonstrate adequate anticipatory awareness during functional, ADL tasks and during discharge planning discussion with SLP, with supervisionA. SLP Short Term Goal 3 (Week 1): Patient will recall and restate specific information from daily therapeutic and medical interventions with supervisionA cues. SLP Short Term Goal 4 (Week 1): Patient will demonstrate adequate alternating attention during completion of functional, mild complex to complex level cognitive tasks, with supervisionA.  Skilled Therapeutic Interventions: Pt seen for skilled ST with focus on cognitive goals, pt in bed with quite a flat affect and not much motivation for therapeutic tasks (however did participate). 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 SLP providing patient with written list of current medications, pt mostly responding "I don't know" or "I guess" when asked questions about meds (is your aspirin chewable? Do you plan to continue your Nicoderm patch?, etc.) Pt participating in simulated medication organization task with BID pill box with Supervision A for accuracy. Pt expresses desire to leave this week, denies any cognitive changes and states right leg is still impaired but improving. Pt left in bed with alarm set and all needs within reach. Cont ST POC.   Pain Pain Assessment Pain Scale: 0-10 Pain Score: 0-No pain  Therapy/Group: Individual Therapy  Dewaine Conger 10/12/2021, 11:05 AM

## 2021-10-12 NOTE — Progress Notes (Signed)
Patient ID: Andrea Williamson, female   DOB: 1951-07-01, 70 y.o.   MRN: [id number]  SW left statement of service in room. SW called pt son Cletus Gash to provide updates on ELOS 7-10 days. SW informed will follow-up with updates tomorrow after team conference. Fam edu scheduled on Wed (11/30) 10am-2pm.   Loralee Pacas, MSW, [place] Office: [phone] Cell: [phone] Fax: [phone]

## 2021-10-12 NOTE — Progress Notes (Signed)
Patient found ambulating to BR by staff,the patient informed the staff that she had been going all day by herself to the bathroom and is "able to take herself without the help of staff" stating also she does not need to wear the  safety belt. This patient was instructed of department safety policy and purpose of always wearing the safety belt to assist her in case a fall should occur,initially she was resistant but allowed the safety belt to be applied Bed alarm placed on moderate control to monitor closely a the patient was made aware  and monitored

## 2021-10-12 NOTE — Progress Notes (Signed)
Physical Therapy Session Note  Patient Details  Name: Andrea Williamson MRN: [id number] Date of Birth: 1951/08/11  Today's Date: 10/12/2021 PT Individual Time: 1300-1355 PT Individual Time Calculation (min): 55 min   Short Term Goals: Week 1:  PT Short Term Goal 1 (Week 1): =LTG due to ELOS  Skilled Therapeutic Interventions/Progress Updates:    Pt received supine in bed, agreeable with encouragement to participate in therapy session. Bed mobility Supervision. Pt is setup A to doff socks and don shoes while seated EOB. Sit to stand with CGA and no AD throughout session. 6MWT: ambulation x 658 ft with no AD and min A increasing to mod A with onset of fatigue due to R inattention and decreased attention to RLE placement. Pt unaware of obstacles in R visual field and resistant to cueing to scan environment, reports there is nothing at home that she will run into. Pt also exhibits decreased RLE clearance with onset of fatigue and tends to drag LE behind her during gait, minimal ability to correct with cues for heel strike. Pt able to attain quadruped positioning on mat table with min A. Quadruped alt UE lifts, alt LE lifts 2 x 10 reps. Pt able to perform alt UE and LE lifts with LLE and RUE, unable to maintain position with LUE and RLE lifts. Pt becomes frustrated with difficulty of task and requests to try different task. Pt able to attain tall-kneeling position on mat table with therapy ball for UE support with min. Lateral "walking" on knees in tall-kneeling x 5 ft each direction with min A for core control to the L, mod A to the R with some assist needed to lift LE up from mat table before initiating lateral step. Due to pt's high fall risk encouraged her to perform floor transfer, pt agreeable. Sitting EOM to sitting on floor with mod A for controlled descent. Demonstrated how to transition from supine to quadruped to tall-kneeling to return to sitting EOM. Pt exhibits difficulty sequencing task and  initiating movement with RUE and RLE, max A required to safely return to sitting EOM. Pt very frustrated by difficulty of task and reports feeling very fatigued, requests to return to her room. Ambulation back to room with no AD and min A, cues for attention to RLE heel strike. Pt returned to bed at Supervision level. Pt left seated in bed with needs in reach, bed alarm in place. Pt missed 20 min of scheduled therapy session due to fatigue and refusal of further participation.  Therapy Documentation Precautions:  Precautions Precautions: Fall Precaution Comments: watch HR - periods of tachycardia per acute, mild R hemi Restrictions Weight Bearing Restrictions: No General: PT Amount of Missed Time (min): 20 Minutes PT Missed Treatment Reason: Patient fatigue;Patient unwilling to participate      Therapy/Group: Individual Therapy   Excell Seltzer, PT, DPT, CSRS  10/12/2021, 4:54 PM

## 2021-10-13 DIAGNOSIS — I63522 Cerebral infarction due to unspecified occlusion or stenosis of left anterior cerebral artery: Secondary | ICD-10-CM | POA: Diagnosis not present

## 2021-10-13 NOTE — Progress Notes (Signed)
Physical Therapy Session Note  Patient Details  Name: Andrea Williamson MRN: [id number] Date of Birth: Aug 04, 1951  Today's Date: 10/13/2021 PT Individual Time: 0800-0900 PT Individual Time Calculation (min): 60 min   Short Term Goals: Week 1:  PT Short Term Goal 1 (Week 1): =LTG due to ELOS  Skilled Therapeutic Interventions/Progress Updates:    Pt received supine in bed, agreeable with encouragement to participate in therapy session. No complaints of pain. Pt is setup A to don pants and shoes at bed level. Bed mobility Supervision. Sit to stand with CGA with and without AD during session. Ambulation up to 400 ft with RW and 200 ft with no AD during session. Pt requires close Supervision to CGA for gait with RW, CGA to min A for gait with no AD. Pt does continue to exhibit unsafe behaviors during gait with poor awareness including inattention to R visual field and RLE including dragging RLE during gait with onset of fatigue. Added 2# ankle weight to RLE for increased input during gait, pt appears to exhibit improved proprioception of limb but reports no change. Ambulation up/down ramp and across uneven surface with RW with cues for safe RW management, min A. Pt exhibits some unsafe behaviors with use of RW including pushing RW far out ahead of her and catching RLE on RW, however pt exhibits improved gait speed and overall balance/safety with use of RW vs no AD. Ambulation through obstacle course stepping on/off mat, ambulating across uneven mat on ground, and weaving through cones with RW and without RW. With use of RW pt requires cues to keep RW close to her body and for safe RW management on/off mat, CGA for balance. With obstacle course navigation with no AD pt requires min A and cueing for RLE awareness as she catches it on cones when attempting to weave through them. Pt continues to exhibit poor awareness of deficits and is resistant to education and therapist pointing out unsafe behaviors. Sit to supine  on real bed in therapy apartment initially with CGA for safety as pt crawls into bed head first and needs assist to safely roll onto her back. Demonstrated method of sitting on bed first before laying down and pt able to perform at Supervision level. Ambulation through agility ladder with focus on increasing RLE clearance with step-to gait pattern. Pt becomes frustrated when not performing task correctly and gives up on attempting. Pt returned to room at end of session, agreeable with encouragement to remain seated in w/c, needs in reach, chair alarm in place.  Therapy Documentation Precautions:  Precautions Precautions: Fall Precaution Comments: watch HR - periods of tachycardia per acute, mild R hemi Restrictions Weight Bearing Restrictions: No     Therapy/Group: Individual Therapy   Excell Seltzer, PT, DPT, CSRS  10/13/2021, 5:22 PM

## 2021-10-13 NOTE — Progress Notes (Signed)
PROGRESS NOTE   Subjective/Complaints:  Pt reports no issues, but wants to leave- was found ambulating to bathroom on her own yesterday evening- and safety belt placed.  LBM 2 days ago but thinks can go today.    ROS:  Pt denies SOB, abd pain, CP, N/V/C/D, and vision changes   Objective:   No results found. Recent Labs    10/12/21 0610  WBC 9.3  HGB 13.0  HCT 37.8  PLT 245   Recent Labs    10/12/21 0610  NA 136  K 3.9  CL 106  CO2 23  GLUCOSE 99  BUN 26*  CREATININE 0.94  CALCIUM 9.3    Intake/Output Summary (Last 24 hours) at 10/13/2021 0857 Last data filed at 10/13/2021 0810 Gross per 24 hour  Intake 735 ml  Output --  Net 735 ml        Physical Exam: Vital Signs Blood pressure (!) 134/59, pulse (!) 47, temperature 98.7 F (37.1 C), temperature source Oral, resp. rate 20, height 5\' 3"  (1.6 m), weight 72 kg, SpO2 98 %.     General: awake, alert, appropriate, flat, almost sullen; irritable about safety belt; NAD HENT: conjugate gaze; oropharynx moist CV: bradycardic rate; no JVD Pulmonary: CTA B/L; no W/R/R- good air movement GI: soft, NT, ND, (+)BS- protuberant- appears stable Psychiatric: appropriate; but irritable; almost sullen Neurological: alert- flat; no spontaneous speech, but is baseline per husband  Skin: No evidence of breakdown, no evidence of rash Neurologic: Cranial nerves II through XII intact, motor strength is 5/5 in bilateral deltoid, bicep, tricep, grip, 3/5 RIgh tand 5/5 Left hip flexor, knee extensors, ankle dorsiflexor and plantar flexor Sensory exam normal sensation to light touch and proprioception in bilateral upper and lower extremities Cerebellar exam mild bilateral intention tremor with  finger to nose to finger Musculoskeletal: Full range of motion in all 4 extremities. No joint swelling Assessment/Plan: 1. Functional deficits which require 3+ hours per day of  interdisciplinary therapy in a comprehensive inpatient rehab setting. Physiatrist is providing close team supervision and 24 hour management of active medical problems listed below. Physiatrist and rehab team continue to assess barriers to discharge/monitor patient progress toward functional and medical goals  Care Tool:  Bathing    Body parts bathed by patient: Right arm, Left arm, Chest, Abdomen, Front perineal area, Buttocks, Right upper leg, Left upper leg, Face, Right lower leg, Left lower leg   Body parts bathed by helper: Right lower leg, Left lower leg     Bathing assist Assist Level: Minimal Assistance - Patient > 75% (Min A for dynamic balance)     Upper Body Dressing/Undressing Upper body dressing   What is the patient wearing?: Pull over shirt    Upper body assist Assist Level: Contact Guard/Touching assist    Lower Body Dressing/Undressing Lower body dressing      What is the patient wearing?: Pants, Incontinence brief     Lower body assist Assist for lower body dressing: Minimal Assistance - Patient > 75%     Toileting Toileting    Toileting assist Assist for toileting: Moderate Assistance - Patient 50 - 74%     Transfers Chair/bed transfer  Transfers assist     Chair/bed transfer assist level: Contact Guard/Touching assist     Locomotion Ambulation   Ambulation assist      Assist level: Moderate Assistance - Patient 50 - 74% Assistive device: No Device Max distance: 658'   Walk 10 feet activity   Assist     Assist level: Moderate Assistance - Patient - 50 - 74% Assistive device: No Device   Walk 50 feet activity   Assist    Assist level: Moderate Assistance - Patient - 50 - 74% Assistive device: No Device    Walk 150 feet activity   Assist    Assist level: Moderate Assistance - Patient - 50 - 74% Assistive device: No Device    Walk 10 feet on uneven surface  activity   Assist Walk 10 feet on uneven surfaces  activity did not occur: Safety/medical concerns         Wheelchair     Assist Is the patient using a wheelchair?: Yes Type of Wheelchair: Manual    Wheelchair assist level: Dependent - Patient 0% Max wheelchair distance: 150'    Wheelchair 50 feet with 2 turns activity    Assist        Assist Level: Dependent - Patient 0%   Wheelchair 150 feet activity     Assist      Assist Level: Dependent - Patient 0%   Blood pressure (!) 134/59, pulse (!) 47, temperature 98.7 F (37.1 C), temperature source Oral, resp. rate 20, height 5\' 3"  (1.6 m), weight 72 kg, SpO2 98 %.  Medical Problem List and Plan: 1. 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  Antithrombotics: -DVT/anticoagulation:  Pharmaceutical: Lovenox             -antiplatelet therapy: ASA/Brilinta.  3. Pain Management: Tylenol prn. 11/24- denies pain- con't regimen  4. Mood: LCSW to follow for evaluation and support.              -antipsychotic agents: N/A 5. Neuropsych: This patient is capable of making decisions on her own behalf. 6. Skin/Wound Care: Routine pressure relief measures.  7. Fluids/Electrolytes/Nutrition: Monitor I/O. Check CMET in am. 8. Dyslipidemia: ON Lipitor daily.' 9. Constipation: Resolved and on Senna S 11/29- LBM 2 days ago- doesn't want meds/intervention 10. Tobacco abuse: Continue nicotine patch.  11. Azotemia  11/24- BUN 41 up from 28- will recheck in AM and push fluids. 11/25- BUN down from 41 to 31 and Cr 1.06- will recheck Monday and monitor  11/26: placed nursing order to encourage 6-8 glasses of water per day 11/28- BUN down to 26- Cr 0.94- con't pushing fluids 12. HTN: add magnesium gluconate 250mg  HS  11/29- BP doing better- con't regimen 13. Overweight  BMI 28.12: provide dietary education.     LOS: 6 days A FACE TO FACE EVALUATION WAS PERFORMED  Chrystopher Stangl 10/13/2021, 8:57 AM

## 2021-10-13 NOTE — Progress Notes (Signed)
Patient ID: Andrea Williamson, female   DOB: 03-Nov-1951, 70 y.o.   MRN: [id number]  SW met with pt and pt husband in room to provide updates from team conference, and d/c date 12/2. Pt SIL arrived while SW present. Discussed pt discharging to her son's home. Husband did not agree with this decision and indicated pt was coming home. SW and SIL encouraged pt to make her own decision. Husband's family will be here for the first two weeks post discharge. SW discussed who will support patient after these two weeks since there were concerns about husband getting dizzy when he stands up too quick. SW encouraged  them to discuss as a family, and SW will follow-up tomorrow.   Loralee Pacas, MSW, [place] Office: [phone] Cell: [phone] Fax: [phone]

## 2021-10-13 NOTE — Progress Notes (Signed)
Occupational Therapy Session Note  Patient Details  Name: Andrea Williamson MRN: [id number] Date of Birth: 04/11/1951  Today's Date: 10/13/2021 OT Individual Time: 7564-3329 OT Individual Time Calculation (min): 70 min    Short Term Goals: Week 1:  OT Short Term Goal 1 (Week 1): STGs = LTGs 2/2 ELOS at Supervision  Skilled Therapeutic Interventions/Progress Updates:    Pt resting in bed upon arrival and agreeable to therapy. OT intervention with focus on functional amb without AD, tub transfers, dynamic standing balance, activity tolerance, and safety awareness to increase independence with BADLs. Pt denies any changes in function and denies any deficitis. Slight Rt inattention noted during ambulation. Pt stepped over into tub with CGA. Pt had to assist lifting RLE into tub but denied her Rt leg was weak. Pt engaged in standing activity at bounce back/rebounder. No LOB noted. Pt amb to day room and engaged in corn hole and retrieving items from floor with no LOB. Pt also engaged in boweling with LOB x 2 noted but able to self correct. Pt denied LOB. Pt with s/s of fatigue during session but denied fatigue. Pt returned to room and returned to bed. Pt remained in bed with bed alarm activated. All needs within reach. Family present.   Therapy Documentation Precautions:  Precautions Precautions: Fall Precaution Comments: watch HR - periods of tachycardia per acute, mild R hemi Restrictions Weight Bearing Restrictions: No  Pain:  Pt denies pain this morniong   Therapy/Group: Individual Therapy  Leroy Libman 10/13/2021, 10:55 AM

## 2021-10-13 NOTE — Patient Care Conference (Signed)
Inpatient RehabilitationTeam Conference and Plan of Care Update Date: 10/13/2021   Time: 11:07 AM    Patient Name: Andrea Williamson      Medical Record Number: [id number]  Date of Birth: 02-Sep-1951 Sex: Female         Room/Bed: 4W17C/4W17C-01 Payor Info: Payor: [employer] MEDICARE / Plan: [facility] MEDICARE / Product Type: *No Product type* /    Admit Date/Time:  10/07/2021  3:28 PM  Primary Diagnosis:  Acute ischemic left ACA stroke [facility])  Hospital Problems: Principal Problem:   Acute ischemic left ACA stroke [facility])    Expected Discharge Date: Expected Discharge Date: 10/16/21  Team Members Present: Physician leading conference: Dr. Courtney Heys Social Worker Present: Loralee Pacas, [place] Nurse Present: Dorthula Nettles, RN PT Present: Excell Seltzer, PT OT Present: Roanna Epley, [place], OT SLP Present: Lillie Columbia, SLP PPS Coordinator present : Gunnar Fusi, SLP     Current Status/Progress Goal Weekly Team Focus  Bowel/Bladder   Patient is continent /incontinent of urine,continent of bowel, LBM 12/11/20 currently on stool softener and prn  Maintain continent, be  Assess toileting needs QS/PRN   Swallow/Nutrition/ Hydration             ADL's   tranfsers-CGA; ADLs-CGA for LB bathing/dressing and toileting  supervision overall  family educaiton, safety awareness, Rt attention   Mobility   Supervision bed mobility, CGA transfers no AD, min A gait no AD x 150 ft, min A stairs  Supervision overall  R NMR, R attention, safety awareness   Communication             Safety/Cognition/ Behavioral Observations  Supervision  Mod I  med management, higher level cog tasks   Pain   Denies pain  < 2  Assess per unit protocol and prn   Skin   Skin intact, right groin small bruise area, no drainage  Maintain skin integrrity  Assess QS/PRN skin and right groin incision site     Discharge Planning:  Pt will d/c to home with her son Cletus Gash and his wife. DIL will provide  24/7 care. Fam edu on Wednesday (11/30) 10am-2pm.   Team Discussion: No insight to diagnosis. Recheck BUN/Cr Thursday. Chronic Bradycardic. Incontinent episodes of bladder. Encouraging fluids. Discharging home with son and son's spouse. Discontinue peripheral IV. Will need a RW at discharge.  Patient on target to meet rehab goals: yes, contact guard assist currently. Contact guard with ADL's but can loose balance.  Supervision goals, mod I with SLP.  *See Care Plan and progress notes for long and short-term goals.   Revisions to Treatment Plan:  None at this time.  Teaching Needs: Family education, medication management, skin/;wound care, safety awareness, transfer training, etc.  Current Barriers to Discharge: Decreased caregiver support, Incontinence, Lack of/limited family support, Weight, and Medication compliance  Possible Resolutions to Barriers: Family education HH/Outpatient PT/OT/SLP Order DME     Medical Summary Current Status: CVA- L ACA stroke- incontinent of bowel/bladder-  Barriers to Discharge: Behavior;Home enviroment access/layout;Decreased family/caregiver support;Incontinence;Neurogenic Bowel & Bladder;Medical stability;Weight;Weight bearing restrictions;Wound care  Barriers to Discharge Comments: going home with son/wife-  CGA-min A with RW- family training tomorrow Possible Resolutions to [employer] Focus: goals Supervision- does lose balance and no awareness- R inattention- and no awareness of LOB- d/c 09/16/21- Friday   Continued Need for Acute Rehabilitation Level of Care: The patient requires daily medical management by a physician with specialized training in physical medicine and rehabilitation for the following reasons: Direction of a  multidisciplinary physical rehabilitation program to maximize functional independence : Yes Medical management of patient stability for increased activity during participation in an intensive rehabilitation regime.:  Yes Analysis of laboratory values and/or radiology reports with any subsequent need for medication adjustment and/or medical intervention. : Yes   I attest that I was present, lead the team conference, and concur with the assessment and plan of the team.   Cristi Loron 10/13/2021, 3:36 PM

## 2021-10-13 NOTE — Progress Notes (Signed)
Speech Language Pathology Daily Session Note  Patient Details  Name: Andrea Williamson MRN: [id number] Date of Birth: 1951-11-02  Today's Date: 10/13/2021 SLP Individual Time: 1400-1440 SLP Individual Time Calculation (min): 40 min  Short Term Goals: Week 1: SLP Short Term Goal 1 (Week 1): Patient will perform simulated medication organization task (pill box) with supervision A for accuracy. SLP Short Term Goal 2 (Week 1): Patient will demonstrate adequate anticipatory awareness during functional, ADL tasks and during discharge planning discussion with SLP, with supervisionA. SLP Short Term Goal 3 (Week 1): Patient will recall and restate specific information from daily therapeutic and medical interventions with supervisionA cues. SLP Short Term Goal 4 (Week 1): Patient will demonstrate adequate alternating attention during completion of functional, mild complex to complex level cognitive tasks, with supervisionA.  Skilled Therapeutic Interventions: Pt seen for skilled ST with focus on cognitive goals, family in room throughout. Pt rolling eyes when SLP entered and explained plan for the day but agreeable to participate. Pt participating in portions of the ALFA - "Counting Money" "Following Written Instructions" "Understanding Medicine Labels" and "Solving Daily Math Problems" with 100% accuracy for all. Pt did at times demonstrate impulsive answers during that were incorrect that patient would self-correct with SPV cues. This is appears to be baseline and unrelated to CVA. Pt continues to endorse no change in anything s/p CVA besides her right leg. Pt able to verbalize strategies to increase safety during mobility (swing R leg through and step on heel) however pt denies any R field changes. Husband states that patient is at baseline function for speech, language and cognition at this time. Pt left in bed with family present for needs. Cont ST POC.   Pain Pain Assessment Pain Scale: 0-10 Pain Score:  0-No pain  Therapy/Group: Individual Therapy  Andrea Williamson 10/13/2021, 2:32 PM

## 2021-10-13 NOTE — Progress Notes (Signed)
Physical Therapy Session Note  Patient Details  Name: Andrea Williamson MRN: [id number] Date of Birth: 02/07/1951  Today's Date: 10/13/2021 PT Individual Time: 1303-1330 PT Individual Time Calculation (min): 27 min   Short Term Goals: Week 1:  PT Short Term Goal 1 (Week 1): =LTG due to ELOS  Skilled Therapeutic Interventions/Progress Updates:     Patient in bed with family and CSW in the room upon PT arrival. Patient alert and agreeable to PT session. Patient denied pain during session.  Therapeutic Activity: Bed Mobility: Patient performed supine to/from sit with mod I from a flat bed with use of bed rails.  Transfers: Patient performed sit to/from stand x4 with supervision without and AD. Provided verbal cues for safety awareness x1.  Gait Training:  Patient ambulated >80 feet x2 without and AD with CGA. Focused increased gait speed, equal weight shift and step length, increased B arm swing, and attention to visual stimuli on the R.   Neuromuscular Re-ed: Patient performed the following dynamic gait activities for reduced risk of falls with functional ambulation: -walking forward with horizontal head turns 2x20 feet -walking backwards focused on equal step length and increased R weigh shift 2x20 feet -side-stepping R/L 2x20 feet -walking with sudden pivot and stop x3  Patient in bed with family in the room at end of session with breaks locked, bed alarm set, and all needs within reach.   Therapy Documentation Precautions:  Precautions Precautions: Fall Precaution Comments: watch HR - periods of tachycardia per acute, mild R hemi Restrictions Weight Bearing Restrictions: No   Therapy/Group: Individual Therapy  Velita Quirk L April Carlyon PT, DPT  10/13/2021, 3:53 PM

## 2021-10-14 NOTE — Progress Notes (Signed)
Patient ID: Andrea Williamson, female   DOB: August 31, 1951, 70 y.o.   MRN: [id number]  SW met with pt and pt DIL Andrea Williamson to discuss how well family edu went, and d/c location. Pt reported that she was going home. Pt agreeable to outpatient PT at [place]. SW to order RW. Pt DIL to follow-up about TTB and if needed.   SW ordered RW with [employer] via parachute.   Andrea Williamson, MSW, [place] Office: [phone] Cell: [phone] Fax: [phone]

## 2021-10-14 NOTE — Progress Notes (Signed)
Occupational Therapy Session Note  Patient Details  Name: BRYAN OMURA MRN: [id number] Date of Birth: 07-13-1951  Today's Date: 10/14/2021 OT Individual Time: 1300-1340 OT Individual Time Calculation (min): 40 min    Short Term Goals: Week 1:  OT Short Term Goal 1 (Week 1): STGs = LTGs 2/2 ELOS at Supervision  Skilled Therapeutic Interventions/Progress Updates:    Pt resting in bed upon arrival with husband, sister-in-law, and daughter-in-law present for family education. Discussed home safety recommendations. Family verbalized understanding. Educated on pt's Rt inattention. When walking out of room, pt bumped food cart on Rt and family recognized pt's deficit. Pt practice tub transfers and TTB transfers. Recommended TTB and family stated they will purchase from local DME store. Also practiced floor tranfsers with supervision. Pt returned to room and returned to bed. Pt remained in bed with family present.   Therapy Documentation Precautions:  Precautions Precautions: Fall Precaution Comments: watch HR - periods of tachycardia per acute, mild R hemi Restrictions Weight Bearing Restrictions: No   Pain: Pain Assessment Pain Scale: 0-10 Pain Score: 0-No pain   Therapy/Group: Individual Therapy  Leroy Libman 10/14/2021, 1:48 PM

## 2021-10-14 NOTE — Progress Notes (Signed)
Speech Language Pathology Daily Session Note  Patient Details  Name: Andrea Williamson MRN: [id number] Date of Birth: 1951/05/27  Today's Date: 10/14/2021 SLP Individual Time: 1016-1100 SLP Individual Time Calculation (min): 44 min  Short Term Goals: Week 1: SLP Short Term Goal 1 (Week 1): Patient will perform simulated medication organization task (pill box) with supervision A for accuracy. SLP Short Term Goal 2 (Week 1): Patient will demonstrate adequate anticipatory awareness during functional, ADL tasks and during discharge planning discussion with SLP, with supervisionA. SLP Short Term Goal 3 (Week 1): Patient will recall and restate specific information from daily therapeutic and medical interventions with supervisionA cues. SLP Short Term Goal 4 (Week 1): Patient will demonstrate adequate alternating attention during completion of functional, mild complex to complex level cognitive tasks, with supervisionA.  Skilled Therapeutic Interventions: Pt seen for skilled ST with focus on cognitive goals and family education, husband, sister in law and daughter present. SLP reviewed "DANCERS" handout regarding cognitive health at home. Pt does meet most of the criteria at baseline with the exception of nutrition and smoking, encouraged to safely continue previous socially and personally relevant activities (bowling, home management, gardening, etc). SLP facilitating moderately complex alternating attention task by providing Mod I cues for 90% accuracy. Pt completing activity in timely manner with no reported difficulty, reports this is baseline. Family agrees patient is at cognitive baseline, did have questions about ambulation at home. SLP discussing general safety precautions, fall risk prevention and supervision at this time. All questions answered to satisfaction. Pt left in bed with family present for needs. Cont ST POC.    Pain Pain Assessment Pain Scale: 0-10 Pain Score: 0-No pain  Therapy/Group:  Individual Therapy  Andrea Williamson 10/14/2021, 10:55 AM

## 2021-10-14 NOTE — Discharge Instructions (Addendum)
Inpatient Rehab Discharge Instructions  Andrea Williamson Discharge date and time: 10/16/21   Activities/Precautions/ Functional Status: Activity: no lifting, driving, or strenuous exercise till cleared by MD Diet: cardiac diet Wound Care: none needed   Functional status:  ___ No restrictions     ___ Walk up steps independently _X__ 24/7 supervision/assistance   ___ Walk up steps with assistance ___ Intermittent supervision/assistance  ___ Bathe/dress independently ___ Walk with walker     _X__ Bathe/dress with assistance ___ Walk Independently    ___ Shower independently _X__ Walk with supervision.     ___ Shower with assistance _X__ No alcohol     ___ Return to work/school ________   Special Instructions: You need a primary care to follow up for your medical needs and medication refills.   COMMUNITY REFERRALS UPON DISCHARGE:     Outpatient: PT             Agency:[facility]   Phone:[phone]              Appointment Date/Time:*Please expect follow-up within 7-10 business days to schedule your appointment. If you have not received follow-up, be sure to make contact the site directly. *  Medical Equipment/Items Ordered:rolling walker                                                 Agency/Supplier: [facility] [phone]    My questions have been answered and I understand these instructions. I will adhere to these goals and the provided educational materials after my discharge from the hospital.  Patient/Caregiver Signature _______________________________ Date __________  Clinician Signature _______________________________________ Date __________  Please bring this form and your medication list with you to all your follow-up doctor's appointments.

## 2021-10-14 NOTE — Progress Notes (Signed)
Occupational Therapy Session Note  Patient Details  Name: Andrea Williamson MRN: [id number] Date of Birth: 1951/11/04  Today's Date: 10/14/2021 OT Individual Time: 0815-0910 OT Individual Time Calculation (min): 55 min    Short Term Goals: Week 1:  OT Short Term Goal 1 (Week 1): STGs = LTGs 2/2 ELOS at Supervision  Skilled Therapeutic Interventions/Progress Updates:    Pt resting in bed upon arrival. Husband present. Pt declined changing clothing this morning. OT intervention with focus on functional amb without AD, standing balance, attention to R, activity tolerance, and safety awareness. Pt denied fatigue during session but OTA noted pt required min verbal cues for RLE swing through during later part of session. Pt amb to day room and completed 7 mins on NuStep level 4. Pt denied fatigued.Pt amb to ortho gym and up ramp onto uneven surface with CGA. While walking out of gym pt's Rt foot ran into equipment placed on her Rt. Pt noted to drift to Rt side of hallway and through doors but denies any changes in vision/awareness of Rt side. Pt uses RUE appropirately and is able to locate items on her Rt without difficulty. Pt retireved numbered discs in hallway in proper sequence. Pt returned to room and was able to located room when leaving gym. Pt returned to bed. Bed alarm activated. All needs within reach.  Therapy Documentation Precautions:  Precautions Precautions: Fall Precaution Comments: watch HR - periods of tachycardia per acute, mild R hemi Restrictions Weight Bearing Restrictions: No Pain: Pain Assessment Pain Scale: 0-10 Pain Score: 0-No pain    Therapy/Group: Individual Therapy  Leroy Libman 10/14/2021, 9:13 AM

## 2021-10-14 NOTE — Progress Notes (Signed)
Inpatient Rehabilitation Discharge Medication Review by a Pharmacist  A complete drug regimen review was completed for this patient to identify any potential clinically significant medication issues.  High Risk Drug Classes Is patient taking? Indication by Medication  Antipsychotic No   Anticoagulant No   Antibiotic No   Opioid No   Antiplatelet Yes Ticagrelor/Aspirin for CVA  Hypoglycemics/insulin No   Vasoactive Medication Yes Amlodipine for BP  Chemotherapy No   Other No      Type of Medication Issue Identified Description of Issue Recommendation(s)  Drug Interaction(s) (clinically significant)     Duplicate Therapy     Allergy     No Medication Administration End Date  Ticagrelor Neurology wants Ticagrelor/aspirin combination X 6 months and then aspirin alone. End date for combination therapy: Mar 17, 2022  Incorrect Dose     Additional Drug Therapy Needed     Significant med changes from prior encounter (inform family/care partners about these prior to discharge).    Other       Clinically significant medication issues were identified that warrant physician communication and completion of prescribed/recommended actions by midnight of the next day:  No  Pharmacist comments: None  Time spent performing this drug regimen review (minutes):  30 minutes   Esgar Barnick J Triton Heidrich 10/14/2021 9:07 AM

## 2021-10-14 NOTE — Progress Notes (Signed)
PROGRESS NOTE   Subjective/Complaints: No complaints this morning Family at bedside No concerns Seen working well with therapy    ROS:  Pt denies SOB, abd pain, CP, N/V/C/D, and vision changes   Objective:   No results found. Recent Labs    10/12/21 0610  WBC 9.3  HGB 13.0  HCT 37.8  PLT 245   Recent Labs    10/12/21 0610  NA 136  K 3.9  CL 106  CO2 23  GLUCOSE 99  BUN 26*  CREATININE 0.94  CALCIUM 9.3    Intake/Output Summary (Last 24 hours) at 10/14/2021 1401 Last data filed at 10/13/2021 2023 Gross per 24 hour  Intake 100 ml  Output 0 ml  Net 100 ml        Physical Exam: Vital Signs Blood pressure 138/67, pulse 65, temperature 97.9 F (36.6 C), temperature source Oral, resp. rate 18, height 5\' 3"  (1.6 m), weight 72 kg, SpO2 99 %. Gen: no distress, normal appearing HEENT: oral mucosa pink and moist, NCAT Cardio: Reg rate Chest: normal effort, normal rate of breathing Abd: soft, non-distended Ext: no edema Psychiatric: appropriate; but irritable; almost sullen Neurological: alert- flat; no spontaneous speech, but is baseline per husband  Skin: No evidence of breakdown, no evidence of rash Neurologic: Cranial nerves II through XII intact, motor strength is 5/5 in bilateral deltoid, bicep, tricep, grip, 3/5 RIgh tand 5/5 Left hip flexor, knee extensors, ankle dorsiflexor and plantar flexor Sensory exam normal sensation to light touch and proprioception in bilateral upper and lower extremities Cerebellar exam mild bilateral intention tremor with  finger to nose to finger Musculoskeletal: Full range of motion in all 4 extremities. No joint swelling Assessment/Plan: 1. Functional deficits which require 3+ hours per day of interdisciplinary therapy in a comprehensive inpatient rehab setting. Physiatrist is providing close team supervision and 24 hour management of active medical problems listed  below. Physiatrist and rehab team continue to assess barriers to discharge/monitor patient progress toward functional and medical goals  Care Tool:  Bathing    Body parts bathed by patient: Right arm, Left arm, Chest, Abdomen, Front perineal area, Buttocks, Right upper leg, Left upper leg, Face, Right lower leg, Left lower leg   Body parts bathed by helper: Right lower leg, Left lower leg     Bathing assist Assist Level: Minimal Assistance - Patient > 75% (Min A for dynamic balance)     Upper Body Dressing/Undressing Upper body dressing   What is the patient wearing?: Pull over shirt    Upper body assist Assist Level: Contact Guard/Touching assist    Lower Body Dressing/Undressing Lower body dressing      What is the patient wearing?: Pants, Incontinence brief     Lower body assist Assist for lower body dressing: Minimal Assistance - Patient > 75%     Toileting Toileting    Toileting assist Assist for toileting: Moderate Assistance - Patient 50 - 74%     Transfers Chair/bed transfer  Transfers assist     Chair/bed transfer assist level: Contact Guard/Touching assist     Locomotion Ambulation   Ambulation assist      Assist level: Contact Guard/Touching assist  Assistive device: Walker-rolling Max distance: 400'   Walk 10 feet activity   Assist     Assist level: Contact Guard/Touching assist Assistive device: Walker-rolling   Walk 50 feet activity   Assist    Assist level: Contact Guard/Touching assist Assistive device: Walker-rolling    Walk 150 feet activity   Assist    Assist level: Contact Guard/Touching assist Assistive device: Walker-rolling    Walk 10 feet on uneven surface  activity   Assist Walk 10 feet on uneven surfaces activity did not occur: Safety/medical concerns   Assist level: Minimal Assistance - Patient > 75% Assistive device: Walker-rolling   Wheelchair     Assist Is the patient using a wheelchair?:  Yes Type of Wheelchair: Manual    Wheelchair assist level: Dependent - Patient 0% Max wheelchair distance: 150'    Wheelchair 50 feet with 2 turns activity    Assist        Assist Level: Dependent - Patient 0%   Wheelchair 150 feet activity     Assist      Assist Level: Dependent - Patient 0%   Blood pressure 138/67, pulse 65, temperature 97.9 F (36.6 C), temperature source Oral, resp. rate 18, height 5\' 3"  (1.6 m), weight 72 kg, SpO2 99 %.  Medical Problem List and Plan: 1. 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  Impaired mobility: continue Lovenox             -antiplatelet therapy: ASA/Brilinta.  3. Pain Management: Tylenol prn. 11/24- denies pain- con't regimen  4. Mood: LCSW to follow for evaluation and support.              -antipsychotic agents: N/A 5. Neuropsych: This patient is capable of making decisions on her own behalf. 6. Skin/Wound Care: Routine pressure relief measures.  7. Fluids/Electrolytes/Nutrition: Monitor I/O. Check CMET in am. 8. Dyslipidemia: Continue Lipitor daily.' 9. Constipation: Resolved and on Senna S 11/29- LBM 2 days ago- doesn't want meds/intervention 10. Tobacco abuse: Continue nicotine patch.  11. Azotemia  11/24- BUN 41 up from 28- will recheck in AM and push fluids. 11/25- BUN down from 41 to 31 and Cr 1.06- will recheck Monday and monitor  11/26: placed nursing order to encourage 6-8 glasses of water per day 11/28- BUN down to 26- Cr 0.94- con't pushing fluids 12. HTN: add magnesium gluconate 250mg  HS  11/29- BP doing better- con't regimen 13. Overweight BMI 28.12: provide dietary education.     LOS: 7 days A FACE TO FACE EVALUATION WAS PERFORMED  Andrea Williamson 10/14/2021, 2:01 PM

## 2021-10-14 NOTE — Progress Notes (Signed)
Physical Therapy Session Note  Patient Details  Name: Andrea Williamson MRN: [id number] Date of Birth: 10-19-1951  Today's Date: 10/14/2021 PT Individual Time: 1100-1200 PT Individual Time Calculation (min): 60 min   Short Term Goals: Week 1:  PT Short Term Goal 1 (Week 1): =LTG due to ELOS  Skilled Therapeutic Interventions/Progress Updates: Pt presented in bed with dgt in law, husband, and other family member present. Session focusing on hands on family education. Pt denies pain during session. Discussed pt's current functional level and explained that pt basically at goal level continues to require CGA to supervision assist, has some R inattention particularly when ambulating. Also discussed use of RW vs no AD. Husband requesting RW although all parties agreed most likely would not be used in home. Pt then performed bed mobility with supervision and use of bed features and ambulated to ortho gym CGA. Pt noted to walk close to R wall however did not have any mis-steps nor hit any objects on R side during this session. In ortho gym demonstrated car transfer at supervision level, requiring cues for hand placement, ambulation on mat and mulch demonstrating compliant surface both performed with CGA. Pt then ambulated to rehab gym and demonstrated ascending/descending stairs with B rails. Pt was able to ascend with step through pattern and attempted descending via step through pattern however demonstrated decreased safety awareness and educated that would be safer to perform via step to pattern at this time. Pt's family questioning kitchen safety. Pt then ambulated to ADL apt and pt was able to demonstrate reaching tasks in high cabinet, low cabinet, fridge, and pantry all with close S. PTA provided extensive education on energy conservation/pacing with activity to both pt and family with family verbalizing understanding. Pt also demonstrated furniture transfer from couch with supervision. Pt ambulated back to  room with husband guarding and demonstrating good safety. Pt returned to bed at end of session and left with call bell within reach and family members present.      Therapy Documentation Precautions:  Precautions Precautions: Fall Precaution Comments: watch HR - periods of tachycardia per acute, mild R hemi Restrictions Weight Bearing Restrictions: No General:   Vital Signs: Therapy Vitals Temp: 98.4 F (36.9 C) Temp Source: Oral Pulse Rate: 66 Resp: 15 BP: 129/77 Patient Position (if appropriate): Lying Oxygen Therapy SpO2: 95 % O2 Device: Room Air Pain:   Mobility:   Locomotion :    Trunk/Postural Assessment :    Balance:   Exercises:   Other Treatments:      Therapy/Group: Individual Therapy  Kionte Baumgardner 10/14/2021, 4:16 PM

## 2021-10-15 DIAGNOSIS — E7800 Pure hypercholesterolemia, unspecified: Secondary | ICD-10-CM

## 2021-10-15 DIAGNOSIS — N179 Acute kidney failure, unspecified: Secondary | ICD-10-CM

## 2021-10-15 NOTE — Progress Notes (Signed)
Occupational Therapy Session Note  Patient Details  Name: Andrea Williamson MRN: [id number] Date of Birth: [birth year]  Today's Date: 10/15/2021 OT Individual Time: 7121-9758 OT Individual Time Calculation (min): 70 min    Short Term Goals: Week 1:  OT Short Term Goal 1 (Week 1): STGs = LTGs 2/2 ELOS at Supervision  Skilled Therapeutic Interventions/Progress Updates:    Pt resting in bed and agreeable to therapy. Supine>sit EOB with supervision. Pt donned pants and socks with supervision. Pt amb without AD (close supervision) to day room and completed 7 mins on NuStep level 4. Pt amb without AD to ortho gym for [employer] using BUE during task. Pt completed test in 2:33 with 0 misses and 0 distractors hit. Pt amb without AD to gym for 9 hole peg test and box & blocks test. Pt unable to complete 9 hole peg test with RUE 2/2 increased intention tremors; LUE 42.48 secs. Pt states her tremors in RUE are not any worse then before her stroke. Box and block: RUE-29 blocks, LUE-39 blocks. Pt continues to deny any deficits other then RLE weakness. When walking out of gym, pt bumped into cart on her Rt but did not acknowledge that she didn't see it/attend to it. Pt returned to room and sat EOB. Pt remained in bed with all needs within reach and bed alarm activated.   Therapy Documentation Precautions:  Precautions Precautions: Fall Precaution Comments: watch HR - periods of tachycardia per acute, mild R hemi Restrictions Weight Bearing Restrictions: No   Pain: Pain Assessment Pain Scale: 0-10 Pain Score: 0-No pain   Therapy/Group: Individual Therapy  Leroy Libman 10/15/2021, 10:52 AM

## 2021-10-15 NOTE — Progress Notes (Signed)
PROGRESS NOTE   Subjective/Complaints:  Pt reports LBM 2 days ago- no issues- ready to leave.  Still has no insight into deficits.    ROS:  Pt denies SOB, abd pain, CP, N/V/C/D, and vision changes    Objective:   No results found. No results for input(s): WBC, HGB, HCT, PLT in the last 72 hours.  No results for input(s): NA, K, CL, CO2, GLUCOSE, BUN, CREATININE, CALCIUM in the last 72 hours.   Intake/Output Summary (Last 24 hours) at 10/15/2021 0832 Last data filed at 10/15/2021 0700 Gross per 24 hour  Intake 597 ml  Output --  Net 597 ml        Physical Exam: Vital Signs Blood pressure 117/83, pulse 66, temperature 98.3 F (36.8 C), temperature source Oral, resp. rate 16, height 5\' 3"  (1.6 m), weight 70.8 kg, SpO2 98 %.    General: awake, alert, appropriate, but irritable; NAD HENT: conjugate gaze; oropharynx moist CV: regular rate; no JVD Pulmonary: CTA B/L; no W/R/R- good air movement GI: soft, NT, ND, (+)BS Psychiatric: appropriate; flat; sparse speech Neurological: alert;   Skin: No evidence of breakdown, no evidence of rash Neurologic: Cranial nerves II through XII intact, motor strength is 5/5 in bilateral deltoid, bicep, tricep, grip, 3/5 RIgh tand 5/5 Left hip flexor, knee extensors, ankle dorsiflexor and plantar flexor Sensory exam normal sensation to light touch and proprioception in bilateral upper and lower extremities Cerebellar exam mild bilateral intention tremor with  finger to nose to finger Musculoskeletal: Full range of motion in all 4 extremities. No joint swelling Assessment/Plan: 1. Functional deficits which require 3+ hours per day of interdisciplinary therapy in a comprehensive inpatient rehab setting. Physiatrist is providing close team supervision and 24 hour management of active medical problems listed below. Physiatrist and rehab team continue to assess barriers to  discharge/monitor patient progress toward functional and medical goals  Care Tool:  Bathing    Body parts bathed by patient: Right arm, Left arm, Chest, Abdomen, Front perineal area, Buttocks, Right upper leg, Left upper leg, Face, Right lower leg, Left lower leg   Body parts bathed by helper: Right lower leg, Left lower leg     Bathing assist Assist Level: Supervision/Verbal cueing     Upper Body Dressing/Undressing Upper body dressing   What is the patient wearing?: Pull over shirt    Upper body assist Assist Level: Independent    Lower Body Dressing/Undressing Lower body dressing      What is the patient wearing?: Pants, Incontinence brief     Lower body assist Assist for lower body dressing: Supervision/Verbal cueing     Toileting Toileting    Toileting assist Assist for toileting: Supervision/Verbal cueing     Transfers Chair/bed transfer  Transfers assist     Chair/bed transfer assist level: Contact Guard/Touching assist     Locomotion Ambulation   Ambulation assist      Assist level: Contact Guard/Touching assist Assistive device: No Device Max distance: 371ft   Walk 10 feet activity   Assist     Assist level: Contact Guard/Touching assist Assistive device: No Device   Walk 50 feet activity   Assist  Assist level: Contact Guard/Touching assist Assistive device: No Device    Walk 150 feet activity   Assist    Assist level: Contact Guard/Touching assist Assistive device: No Device    Walk 10 feet on uneven surface  activity   Assist Walk 10 feet on uneven surfaces activity did not occur: Safety/medical concerns   Assist level: Contact Guard/Touching assist Assistive device: Other (comment) (no device)   Wheelchair     Assist Is the patient using a wheelchair?: Yes Type of Wheelchair: Manual    Wheelchair assist level: Dependent - Patient 0% Max wheelchair distance: 150'    Wheelchair 50 feet with 2 turns  activity    Assist        Assist Level: Dependent - Patient 0%   Wheelchair 150 feet activity     Assist      Assist Level: Dependent - Patient 0%   Blood pressure 117/83, pulse 66, temperature 98.3 F (36.8 C), temperature source Oral, resp. rate 16, height 5\' 3"  (1.6 m), weight 70.8 kg, SpO2 98 %.  Medical Problem List and Plan: 1. Functional deficits secondary to Left ACA A2 infarct             -patient may  shower             -ELOS/Goals: 16-18d /ModI-S  Continue CIR- PT, OT and SLP  D/c tomorrow 2.  Impaired mobility: continue Lovenox             -antiplatelet therapy: ASA/Brilinta.  3. Pain Management: Tylenol prn. 11/24- denies pain- con't regimen  4. Mood: LCSW to follow for evaluation and support.              -antipsychotic agents: N/A 5. Neuropsych: This patient is capable of making decisions on her own behalf. 6. Skin/Wound Care: Routine pressure relief measures.  7. Fluids/Electrolytes/Nutrition: Monitor I/O. Check CMET in am. 8. Dyslipidemia: Continue Lipitor daily.' 9. Constipation: Resolved and on Senna S 11/29- LBM 2 days ago- doesn't want meds/intervention  12/1- LBM Tuesday- con't regimen- refuses additional meds.  10. Tobacco abuse: Continue nicotine patch.  11. Azotemia  11/24- BUN 41 up from 28- will recheck in AM and push fluids. 11/25- BUN down from 41 to 31 and Cr 1.06- will recheck Monday and monitor  11/26: placed nursing order to encourage 6-8 glasses of water per day 11/28- BUN down to 26- Cr 0.94- con't pushing fluids 12. HTN: add magnesium gluconate 250mg  HS  11/29- BP doing better- con't regimen 13. Overweight BMI 28.12: provide dietary education.     LOS: 8 days A FACE TO FACE EVALUATION WAS PERFORMED  Andrea Williamson 10/15/2021, 8:32 AM

## 2021-10-15 NOTE — Progress Notes (Signed)
Physical Therapy Discharge Summary  Patient Details  Name: Andrea Williamson MRN: [id number] Date of Birth: March 13, 1951  Today's Date: 10/15/2021     Patient has met 9 of 9 long term goals due to improved activity tolerance, improved balance, improved attention, improved awareness, and improved coordination.  Patient to discharge at an ambulatory level Supervision.   Patient's care partner is independent to provide the necessary physical assistance at discharge.  Reasons goals not met: N/A all goals met  Recommendation:  Patient will benefit from ongoing skilled PT services in outpatient setting to continue to advance safe functional mobility, address ongoing impairments in endurance, balance, safety, independence with functional mobility, and minimize fall risk.  Equipment: RW  Reasons for discharge: treatment goals met  Patient/family agrees with progress made and goals achieved: Yes  PT Discharge Precautions/Restrictions Precautions Precautions: Fall Precaution Comments: watch HR - periods of tachycardia per acute, mild R hemi Restrictions Weight Bearing Restrictions: No Pain Pain Assessment Pain Scale: 0-10 Pain Score: 0-No pain Pain Interference Pain Interference Pain Effect on Sleep: 1. Rarely or not at all Pain Interference with Therapy Activities: 1. Rarely or not at all Pain Interference with Day-to-Day Activities: 1. Rarely or not at all Vision/Perception  Vision - History Ability to See in Adequate Light: 0 Adequate  Cognition Overall Cognitive Status: Impaired/Different from baseline Arousal/Alertness: Awake/alert Orientation Level: Oriented X4 Year: 2022 Month: December Day of Week: Correct Sensation Sensation Light Touch: Appears Intact Proprioception: Impaired Detail Proprioception Impaired Details: Impaired RUE;Impaired RLE Motor  Motor Motor: Hemiplegia;Abnormal postural alignment and control Motor - Skilled Clinical Observations: R hemi Motor -  Discharge Observations: Improved but continues to demonstrate mild R inattention  Mobility Bed Mobility Bed Mobility: Rolling Right;Rolling Left;Supine to Sit;Sit to Supine Rolling Right: Independent Rolling Left: Independent Supine to Sit: Independent with assistive device Sit to Supine: Independent with assistive device Transfers Transfers: Stand to Sit;Sit to Stand Locomotion  Gait Ambulation: Yes Gait Assistance: Supervision/Verbal cueing Gait Distance (Feet): 150 Feet Assistive device: None Gait Assistance Details: Verbal cues for precautions/safety Gait Gait: Yes Gait Pattern: Impaired Gait Pattern: Lateral trunk lean to right;Decreased stance time - right;Decreased stride length;Decreased weight shift to right Gait velocity: (P) decreased Stairs / Additional Locomotion Stairs: Yes Stairs Assistance: Supervision/Verbal cueing Stair Management Technique: Two rails;Step to pattern Number of Stairs: 8 Height of Stairs: 6 Wheelchair Mobility Wheelchair Mobility: No  Trunk/Postural Assessment  Cervical Assessment Cervical Assessment: Within Functional Limits Thoracic Assessment Thoracic Assessment: Exceptions to [facility] (rounded shoulders) Lumbar Assessment Lumbar Assessment: Exceptions to [facility] (posterior pelvic tilt) Postural Control Righting Reactions: impaired/delayed  Balance Balance Balance Assessed: Yes Standardized Balance Assessment Standardized Balance Assessment: Berg Balance Test Berg Balance Test Sit to Stand: Able to stand  independently using hands Standing Unsupported: Able to stand safely 2 minutes Sitting with Back Unsupported but Feet Supported on Floor or Stool: Able to sit safely and securely 2 minutes Stand to Sit: Controls descent by using hands Transfers: Able to transfer safely, minor use of hands Standing Unsupported with Eyes Closed: Able to stand 10 seconds safely Standing Ubsupported with Feet Together: Able to place feet together  independently and stand 1 minute safely From Standing, Reach Forward with Outstretched Arm: Can reach forward >12 cm safely (5") From Standing Position, Pick up Object from Floor: Able to pick up shoe, needs supervision From Standing Position, Turn to Look Behind Over each Shoulder: Looks behind from both sides and weight shifts well Turn 360 Degrees: Able to turn 360 degrees  safely but slowly Standing Unsupported, Alternately Place Feet on Step/Stool: Able to stand independently and complete 8 steps >20 seconds Standing Unsupported, One Foot in Front: Needs help to step but can hold 15 seconds Standing on One Leg: Tries to lift leg/unable to hold 3 seconds but remains standing independently Total Score: 43 Static Sitting Balance Static Sitting - Balance Support: No upper extremity supported;Feet supported Static Sitting - Level of Assistance: 7: Independent Dynamic Sitting Balance Dynamic Sitting - Balance Support: No upper extremity supported;Feet supported;During functional activity Dynamic Sitting - Level of Assistance: 7: Independent Static Standing Balance Static Standing - Balance Support: No upper extremity supported;During functional activity Static Standing - Level of Assistance: 5: Stand by assistance Dynamic Standing Balance Dynamic Standing - Balance Support: During functional activity Dynamic Standing - Level of Assistance: 5: Stand by assistance Extremity Assessment   RLE Assessment RLE Assessment: Within Functional Limits RLE Strength Right Hip Flexion: 4+/5 Right Knee Flexion: 4+/5 Right Knee Extension: 4+/5 Right Ankle Dorsiflexion: 3+/5 Right Ankle Plantar Flexion: 3+/5 LLE Assessment LLE Assessment: Within Functional Limits General Strength Comments: 5/5 grossly    Rosita DeChalus Excell Seltzer, PT, DPT, CSRS  10/15/2021, 8:37 AM

## 2021-10-15 NOTE — Progress Notes (Signed)
Physical Therapy Session Note  Patient Details  Name: Andrea Williamson MRN: [id number] Date of Birth: Sep 07, 1951  Today's Date: 10/15/2021 PT Individual Time: 0805-0920 PT Individual Time Calculation (min): 75 min   Short Term Goals: Week 1:  PT Short Term Goal 1 (Week 1): =LTG due to ELOS  Skilled Therapeutic Interventions/Progress Updates: Pt presented in bed agreeable to therapy. Pt denies pain during session. Session focused on functional mobility in preparation for d/c. Pt performed bed mobility mod I and requested new scrub shirt which pt donned with set up. Pt then ambulated to day room without AD and supervision and participated in [place] with an improved score of 43/56 from 19/56. (Patient demonstrates increased fall risk as noted by score of  43/56 on Berg Balance Scale.  (<36= high risk for falls, close to 100%; 37-45 significant >80%; 46-51 moderate >50%; 52-55 lower >25%). Pt noted to be frequently sit between tasks and when asked if fatigued by PTA pt denies any fatigue. Pt then ambulated to rehab gym and participated in static balance activity while standing on compliant surface placing blocks in specific pattern. Pt required x 1 cues with moderately challenging activity. However pt demonstrated decreased safety awareness when stepping onto/off Airex. Pt then participated in ascending/descending stairs x 8 with B rails and supervision.Pt noted to have decreased eccentric control when descending but demonstrated fair safety holding B rails. Pt then participated in 6MWTwith pt refusing to take rest breaks but noted fatigue once completed with pt denying. Pt ambulated total 937f with supervision fading to CGA with fatigue. PTA provided continued education on the importance of rest breaks. Pt then ambulated back to room after rest and requesting to use bathroom prior to return to bed. Pt gathered brief and ambulated to bathroom. Performed toilet transfer with supervision and pt left  to void as PTA entered info on flowsheet. As PTA returned to bathroom pt noted to be standing facing toilet with RLE on toilet as pt attempted to perform peri-care. PTA informed pt of unsafe manner and pt noted to be minimally receptive to education. Pt returned to bed and completed LB dressing at bed level. Pt left in bed at end of session with bed alarm on, call bell within reach and needs met.       Therapy Documentation Precautions:  Precautions Precautions: Fall Precaution Comments: watch HR - periods of tachycardia per acute, mild R hemi Restrictions Weight Bearing Restrictions: No General:   Vital Signs:   Pain:   Mobility:   Locomotion : Gait Ambulation: (P) Yes Gait Assistance: (P) Supervision/Verbal cueing;Contact Guard/Touching assist Gait Distance (Feet): (P) 200 Feet Assistive device: (P) None Gait Assistance Details: (P) Verbal cues for precautions/safety Gait Gait: (P) Yes Gait Pattern: (P) Impaired Gait velocity: (P) decreased Stairs / Additional Locomotion Stairs: (P) Yes Stairs Assistance: (P) Contact Guard/Touching assist Stair Management Technique: (P) Two rails;Step to pattern Wheelchair Mobility Wheelchair Mobility: (P) No  Trunk/Postural Assessment : Cervical Assessment Cervical Assessment: Within Functional Limits Thoracic Assessment Thoracic Assessment: Exceptions to [facility](rounded shoulders) Lumbar Assessment Lumbar Assessment: Exceptions to [facility](posterior pelvic tilt) Postural Control Righting Reactions: impaired/delayed  Balance: Static Sitting Balance Static Sitting - Balance Support: No upper extremity supported;Feet supported Static Sitting - Level of Assistance: 7: Independent Dynamic Sitting Balance Dynamic Sitting - Balance Support: No upper extremity supported;Feet supported;During functional activity Dynamic Sitting - Level of Assistance: 7: Independent Exercises:   Other Treatments:      Therapy/Group: Individual  Therapy  Danayah Smyre 10/15/2021, 12:23 PM

## 2021-10-15 NOTE — Discharge Summary (Signed)
Physician Discharge Summary  Patient ID: Andrea Williamson MRN: [id number] DOB/AGE: 18-Dec-1950 70 y.o.  Admit date: 10/07/2021 Discharge date: 10/16/2021  Discharge Diagnoses:  Principal Problem:   Acute ischemic left ACA stroke [facility]) Active Problems:   Elevated cholesterol   Acute renal injury ([place])   HTN (hypertension)   Constipation   Discharged Condition: good  Significant Diagnostic Studies: N/A  Labs:  Basic Metabolic Panel: BMP Latest Ref Rng & Units 10/12/2021 10/09/2021 10/08/2021  Glucose 70 - 99 mg/dL 99 128(H) 105(H)  BUN 8 - 23 mg/dL 26(H) 31(H) 41(H)  Creatinine 0.44 - 1.00 mg/dL 0.94 1.06(H) 0.90  Sodium 135 - 145 mmol/L 136 137 136  Potassium 3.5 - 5.1 mmol/L 3.9 4.1 3.9  Chloride 98 - 111 mmol/L 106 107 107  CO2 22 - 32 mmol/L 23 22 21(L)  Calcium 8.9 - 10.3 mg/dL 9.3 9.4 9.4     CBC: CBC Latest Ref Rng & Units 10/12/2021 10/08/2021 10/06/2021  WBC 4.0 - 10.5 K/uL 9.3 7.9 8.3  Hemoglobin 12.0 - 15.0 g/dL 13.0 14.1 13.9  Hematocrit 36.0 - 46.0 % 37.8 42.0 39.9  Platelets 150 - 400 K/uL 245 259 241     CBG: No results for input(s): GLUCAP in the last 168 hours.  Brief HPI:   Andrea Williamson is a 70 y.o. female in good health but lack of medical care for years.  She was admitted on 10/01/2021 with RLE weakness x2 days as well as difficulty speaking.  CT head showed acute infarct in left frontal lobe ACA territory.  She continued to wax and wane in ED and CTA head done showing focal occlusion of the left A2 segment, moderate focal stenosis of proximal right A2 segment and 27 cc penumbra and ACA distribution.  She underwent cerebral angio revealing occlusion of left-A2/ACA and underwent thrombectomy with revascularization and left P2 stenting by Dr. Janean Sark.    Postprocedure to continue on aspirin and Brilinta.  Follow-up MRI brain done revealing left-ACA and ACA/PCA watershed territory infarct and new area of diffusion in right parietal lobe.  Dr. 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  Aphasia has resolved but she continued to have difficulty with high-level cognitive tasks, RLE weakness with balance deficits as well as decrease in fine motor coordination of RLE.  CIR was recommended due to functional decline.   Hospital Course: Andrea Williamson was admitted to rehab 10/07/2021 for inpatient therapies to consist of PT, ST and OT at least three hours five days a week. Past admission physiatrist, therapy team and rehab RN have worked together to provide customized collaborative inpatient rehab.  She was maintained on aspirin and Brilinta throughout his stay and serial check of CBC shows H&H and platelets to be stable.  Her blood pressures were monitored on TID basis and has improved with addition of magnesium gluconate.  She has been educated on appropriate diet to help promote overall health.    Constipation has resolved with use of senna S.  She was noted to have azotemia at admission and was encouraged to push fluids.  Serial check of electrolytes shows improvement in renal status with increase in fluid intake.  She has made steady progress during her rehab stay and supervision is recommended due to fall risk with poor safety awareness and inability to pace herself and fatigue.  To receive follow-up PT at [facility] after discharge.  Rehab course: During patient's stay in rehab weekly team conferences were held to monitor patient's progress, set goals and discuss barriers to discharge. At admission, patient required exhibited min assist with basic ADL tasks and with mobility.  She exhibited mild cognitive impairments with some difficulty expressing needs and ideas. She  has had improvement in activity tolerance, balance, postural control as well as ability to compensate for deficits. She requires supervision for transfers and to ambulate short distances without assistive device. Berg  balance score has improved from 19/56 to 43/56. She is able to complete moderately complex tasks at modified independent level.  She requires supervision due to poor awareness but did not fully engage in speech therapy sessions.  Family education has been completed regarding all aspects of safety and care.   Discharge disposition: 01-Home or Self Care  Diet: Heart Healthy.  Special Instructions: No driving till cleared by MD.  Discharge Instructions     Ambulatory referral to Cardiology   Complete by: As directed    Needs 30 day event monitor   Ambulatory referral to Neurology   Complete by: As directed    An appointment is requested in approximately: 3 weeks   Ambulatory referral to Physical Therapy   Complete by: As directed    Eval and treat      Allergies as of 10/16/2021   No Known Allergies      Medication List     STOP taking these medications    bisacodyl 10 MG suppository Commonly known as: DULCOLAX   enoxaparin 40 MG/0.4ML injection Commonly known as: LOVENOX       TAKE these medications    acetaminophen 325 MG tablet Commonly known as: TYLENOL Take 1-2 tablets (325-650 mg total) by mouth every 4 (four) hours as needed for mild pain.   amLODipine 5 MG tablet Commonly known as: NORVASC Take 1 tablet (5 mg total) by mouth daily.   Aspirin Low Dose 81 MG chewable tablet Generic drug: aspirin Chew 1 tablet (81 mg total) by mouth daily.   atorvastatin 80 MG tablet Commonly known as: LIPITOR Take 1 tablet (80 mg total) by mouth daily.   Brilinta 90 MG Tabs tablet Generic drug: ticagrelor Take 1 tablet (90 mg total) by mouth 2 (two) times daily.   magnesium gluconate 500 MG tablet Commonly known as: MAGONATE Take 0.5 tablets (250 mg total) by mouth at bedtime.   nicotine 21 mg/24hr patch Commonly known as: NICODERM CQ - dosed in mg/24 hours Place 1 patch (21 mg total) onto the skin daily.   Senexon-S 8.6-50 MG tablet Generic drug:  senna-docusate Take 1 tablet by mouth daily at 6 (six) AM.        Follow-up Information     Lovorn, Jinny Blossom, MD. Call.   Specialty: Physical Medicine and Rehabilitation Why: As needed Contact information: [address] [place] [address] [phone]         [facility]. Call.   Why: for stroke follow up Contact information: [place] [id number] [place] Office Follow up.   Specialty: Cardiology Why: will call regarding 30 day event monitor Contact information: [address], [address]        Derinda Late, MD. Call.   Specialty: Family Medicine Why: for post hospital follow Contact information: [address] [facility] -  Family and Internal Medicine [place] [address] [phone]                 Signed: Bary Leriche 10/19/2021, 1:38 AM

## 2021-10-15 NOTE — Progress Notes (Signed)
Speech Language Pathology Discharge Summary  Patient Details  Name: Andrea Williamson MRN: [id number] Date of Birth: 1951-08-07  Today's Date: 10/15/2021 SLP Individual Time: 1300-1335 SLP Individual Time Calculation (min): 35 min   Skilled Therapeutic Interventions:  Patient seen with spouse and sister in law present for discharge education. Patient herself was happy to be going home but did not appear engaged in discussion regarding discharge safety recommendations. Her spouse seemed to be in agreement and he stated they were installing grab bars in bathroom. Patient rolled her eyes at this and said she did not want them. SLP stressed importance of patient working with family members as a team to work out a safe and manageable daily routine. Patient and family members did not have any further questions.     Patient has met 3 of 4 long term goals.  Patient to discharge at overall Supervision;Modified Independent level.  Reasons goals not met: Patient did not meet anticipatory awareness goal as she did not demonstrate adequate understanding of her limitations upon discharging home.   Clinical Impression/Discharge Summary: Patient met 3 of 4 LTG's and as per SLP's notes from family education, reportedly she is at her cognitive baseline. Patient demonstrated ability to perform moderately complex cognitive tasks at modified independent level. The goal she did not meet, anticipatory awareness, is not at modified independent level and this does seem to be combination of poor awareness and also perhaps some patient denial/disinterest as patient did not fully engage in sessions. SLP is not recommending f/u SLP upon dischage but is recommending 24 hour supervision from family.  Care Partner:  Caregiver Able to Provide Assistance: Yes  Type of Caregiver Assistance: Physical;Cognitive  Recommendation:  None      Equipment: none for ST   Reasons for discharge: Discharged from hospital   Patient/Family  Agrees with Progress Made and Goals Achieved: Yes    Sonia Baller, MA, CCC-SLP Speech Therapy

## 2021-10-15 NOTE — Progress Notes (Signed)
Occupational Therapy Discharge Summary  Patient Details  Name: Andrea Williamson MRN: [id number] Date of Birth: 1951-03-16  Patient has met 11 of 11 long term goals due to improved activity tolerance, improved balance, postural control, ability to compensate for deficits, functional use of  RIGHT upper and RIGHT lower extremity, and improved coordination.  Pt made steady progress with BADLs and functional transfers during this admission. Pt completes all tasks with supervision/close supervision. Transfers without AD. TTB transfers with supervision. Recommendation for pt to use TTB at home and family will acquire. Pt denies any deficits outside of RLE weakness. Rt inattention noted during ambulation and pt will occasionally run into objects or doorways. Pt denies inattention. Pt unable to complete 9 hole peg test with RUE 2/2 increased tremors. Pt's family (husband, sister-in-law, and daughter-in-law) have participated in family education. Patient to discharge at overall Supervision level.  Patient's care partner is independent to provide the necessary physical and cognitive assistance at discharge.    Reasons goals not met: n/a  Recommendation:  No f/u of Ot services at this time.   Equipment: No equipment provided  Reasons for discharge: treatment goals met and discharge from hospital  Patient/family agrees with progress made and goals achieved: Yes  OT Discharge Vision Baseline Vision/History: 1 Wears glasses Patient Visual Report: No change from baseline Vision Assessment?: No apparent visual deficits Perception  Inattention/Neglect: Does not attend to right visual field (primarily noticed during ambulation) Praxis Praxis: Intact Cognition Overall Cognitive Status: Impaired/Different from baseline Arousal/Alertness: Awake/alert Orientation Level: Oriented X4 Year: 2022 Month: December Day of Week: Correct Attention: Focused;Sustained;Selective Focused Attention: Appears  intact Sustained Attention: Appears intact Selective Attention: Impaired Memory: Impaired Immediate Memory Recall: Sock;Blue;Bed Memory Recall Sock: With Cue Memory Recall Blue: With Cue Memory Recall Bed: With Cue Awareness: Impaired Awareness Impairment: Intellectual impairment;Emergent impairment;Anticipatory impairment Problem Solving: Impaired Safety/Judgment: Impaired Sensation Sensation Light Touch: Appears Intact Hot/Cold: Appears Intact Proprioception: Impaired Detail Proprioception Impaired Details: Impaired RUE;Impaired RLE Coordination Gross Motor Movements are Fluid and Coordinated: No Fine Motor Movements are Fluid and Coordinated: No Coordination and Movement Description: impaired 2/2 R hemi; Bil tremors R>L Finger Nose Finger Test: impaired 9 Hole Peg Test: unable to complete with RUE; LUE-42.48 secs Motor  Motor Motor: Hemiplegia;Abnormal postural alignment and control Motor - Skilled Clinical Observations: R hemi Motor - Discharge Observations: Improved but continues to demonstrate mild R inattention Mobility  Bed Mobility Bed Mobility: Rolling Right;Rolling Left;Supine to Sit;Sit to Supine Rolling Right: Independent Rolling Left: Independent Supine to Sit: Independent with assistive device Sit to Supine: Independent with assistive device  Trunk/Postural Assessment  Cervical Assessment Cervical Assessment: Within Functional Limits Thoracic Assessment Thoracic Assessment: Exceptions to [facility] (rounded shoulders) Lumbar Assessment Lumbar Assessment: Exceptions to [facility] (posterior pelvic tilt) Postural Control Righting Reactions: impaired/delayed  Balance Balance Balance Assessed: Yes Standardized Balance Assessment Standardized Balance Assessment: Berg Balance Test Berg Balance Test Sit to Stand: Able to stand  independently using hands Standing Unsupported: Able to stand safely 2 minutes Sitting with Back Unsupported but Feet Supported on Floor or  Stool: Able to sit safely and securely 2 minutes Stand to Sit: Controls descent by using hands Transfers: Able to transfer safely, minor use of hands Standing Unsupported with Eyes Closed: Able to stand 10 seconds safely Standing Ubsupported with Feet Together: Able to place feet together independently and stand 1 minute safely From Standing, Reach Forward with Outstretched Arm: Can reach forward >12 cm safely (5") From Standing Position, Pick up Object from Floor:  Able to pick up shoe, needs supervision From Standing Position, Turn to Look Behind Over each Shoulder: Looks behind from both sides and weight shifts well Turn 360 Degrees: Able to turn 360 degrees safely but slowly Standing Unsupported, Alternately Place Feet on Step/Stool: Able to stand independently and complete 8 steps >20 seconds Standing Unsupported, One Foot in Front: Needs help to step but can hold 15 seconds Standing on One Leg: Tries to lift leg/unable to hold 3 seconds but remains standing independently Total Score: 43 Static Sitting Balance Static Sitting - Balance Support: No upper extremity supported;Feet supported Static Sitting - Level of Assistance: 7: Independent Dynamic Sitting Balance Dynamic Sitting - Balance Support: No upper extremity supported;Feet supported;During functional activity Dynamic Sitting - Level of Assistance: 7: Independent Static Standing Balance Static Standing - Balance Support: No upper extremity supported;During functional activity Static Standing - Level of Assistance: 5: Stand by assistance Dynamic Standing Balance Dynamic Standing - Balance Support: During functional activity Dynamic Standing - Level of Assistance: 5: Stand by assistance Extremity/Trunk Assessment RUE Assessment RUE Assessment: Within Functional Limits Active Range of Motion (AROM) Comments: grossly 4/5, intention tremors LUE Assessment LUE Assessment: Within Functional Limits General Strength Comments: grossly  5/5; intention tremors   Leroy Libman 10/15/2021, 10:50 AM

## 2021-10-16 ENCOUNTER — Other Ambulatory Visit (HOSPITAL_COMMUNITY): Payer: Self-pay

## 2021-10-16 MED ORDER — ASPIRIN 81 MG PO CHEW
81.0000 mg | CHEWABLE_TABLET | Freq: Every day | ORAL | 0 refills | Status: DC
  Filled 2021-10-16: qty 100, 100d supply, fill #0

## 2021-10-16 MED ORDER — TICAGRELOR 90 MG PO TABS
90.0000 mg | ORAL_TABLET | Freq: Two times a day (BID) | ORAL | 0 refills | Status: DC
  Filled 2021-10-16: qty 60, 30d supply, fill #0

## 2021-10-16 MED ORDER — SENNOSIDES-DOCUSATE SODIUM 8.6-50 MG PO TABS
1.0000 | ORAL_TABLET | Freq: Every day | ORAL | 0 refills | Status: DC
  Filled 2021-10-16: qty 30, 30d supply, fill #0

## 2021-10-16 MED ORDER — MAGNESIUM GLUCONATE 500 MG PO TABS
250.0000 mg | ORAL_TABLET | Freq: Every day | ORAL | 0 refills | Status: DC
  Filled 2021-10-16: qty 30, 60d supply, fill #0

## 2021-10-16 MED ORDER — ACETAMINOPHEN 325 MG PO TABS
325.0000 mg | ORAL_TABLET | ORAL | Status: DC | PRN
Start: 2021-10-16 — End: 2021-12-26

## 2021-10-16 MED ORDER — NICOTINE 21 MG/24HR TD PT24
21.0000 mg | MEDICATED_PATCH | Freq: Every day | TRANSDERMAL | 0 refills | Status: DC
  Filled 2021-10-16: qty 28, 28d supply, fill #0

## 2021-10-16 MED ORDER — ATORVASTATIN CALCIUM 80 MG PO TABS
80.0000 mg | ORAL_TABLET | Freq: Every day | ORAL | 0 refills | Status: DC
  Filled 2021-10-16: qty 30, 30d supply, fill #0

## 2021-10-16 MED ORDER — AMLODIPINE BESYLATE 5 MG PO TABS
5.0000 mg | ORAL_TABLET | Freq: Every day | ORAL | 0 refills | Status: DC
  Filled 2021-10-16: qty 30, 30d supply, fill #0

## 2021-10-16 NOTE — Progress Notes (Signed)
Patient ID: Andrea Williamson, female   DOB: Jul 17, 1951, 70 y.o.   MRN: [id number]  SW faxed outpatient PT referral to [facility] ([phone]/[phone]).  SW met with pt, pt husband, and SIL to review d/c. No questions/concerns reported.   Loralee Pacas, MSW, [place] Office: [phone] Cell: [phone] Fax: [phone]

## 2021-10-16 NOTE — Progress Notes (Signed)
Alert and oriented x4, compliant with medication administration. Denies pain, no signs or symptoms of discomfort. Family at bedside. Reesa Chew, PA provided discharge instructions/medication review. Staff assisted patient off unit; patient discharged. Patient left via private car.

## 2021-10-16 NOTE — Progress Notes (Signed)
Inpatient Rehabilitation Care Coordinator Discharge Note   Patient Details  Name: Andrea Williamson MRN: [id number] Date of Birth: [birth year]   Discharge location: D/c to home with her husband and support from family.  Length of Stay: 8 days  Discharge activity level: Supervision/Close Supervision for some of ADLs  Home/community participation: Limited  Patient response XI:PJASNK Literacy - How often do you need to have someone help you when you read instructions, pamphlets, or other written material from your doctor or pharmacy?: Never  Patient response NL:ZJQBHA Isolation - How often do you feel lonely or isolated from those around you?: Never  Services provided included: MD, RD, PT, OT, SLP, RN, TR, Neuropsych, Pharmacy, CM, SW  Financial Services:  Charity fundraiser Utilized: [facility] [employer]  Choices offered to/list presented to: Yes  Follow-up services arranged:  Outpatient, DME    Outpatient Servicies: [facility] for PT DME : [facility] for RW    Patient response to transportation need: Is the patient able to respond to transportation needs?: Yes In the past 12 months, has lack of transportation kept you from medical appointments or from getting medications?: No In the past 12 months, has lack of transportation kept you from meetings, work, or from getting things needed for daily living?: No   Comments (or additional information):  Patient/Family verbalized understanding of follow-up arrangements:  Yes  Individual responsible for coordination of the follow-up plan: contact pt son Cletus Gash [phone]  Confirmed correct DME delivered: Rana Snare 10/16/2021    Rana Snare

## 2021-10-16 NOTE — Progress Notes (Signed)
PROGRESS NOTE   Subjective/Complaints:  Pt has no issues- no insight into deficits.   ROS:  Pt denies SOB, abd pain, CP, N/V/C/D, and vision changes    Objective:   No results found. No results for input(s): WBC, HGB, HCT, PLT in the last 72 hours.  No results for input(s): NA, K, CL, CO2, GLUCOSE, BUN, CREATININE, CALCIUM in the last 72 hours.   Intake/Output Summary (Last 24 hours) at 10/16/2021 0846 Last data filed at 10/15/2021 1300 Gross per 24 hour  Intake 240 ml  Output --  Net 240 ml        Physical Exam: Vital Signs Blood pressure 128/82, pulse 69, temperature 98.2 F (36.8 C), temperature source Oral, resp. rate 17, height 5\' 3"  (1.6 m), weight 70.4 kg, SpO2 100 %.     General: awake, alert, appropriate, sitting up in bed; NAD HENT: conjugate gaze; oropharynx moist CV: regular rate; no JVD Pulmonary: CTA B/L; no W/R/R- good air movement GI: soft, NT, ND, (+)BS Psychiatric: appropriate- irritable Neurological: alert  Skin: No evidence of breakdown, no evidence of rash Neurologic: Cranial nerves II through XII intact, motor strength is 5/5 in bilateral deltoid, bicep, tricep, grip, 3/5 RIgh tand 5/5 Left hip flexor, knee extensors, ankle dorsiflexor and plantar flexor Sensory exam normal sensation to light touch and proprioception in bilateral upper and lower extremities Cerebellar exam mild bilateral intention tremor with  finger to nose to finger Musculoskeletal: Full range of motion in all 4 extremities. No joint swelling Assessment/Plan: 1. Functional deficits which require 3+ hours per day of interdisciplinary therapy in a comprehensive inpatient rehab setting. Physiatrist is providing close team supervision and 24 hour management of active medical problems listed below. Physiatrist and rehab team continue to assess barriers to discharge/monitor patient progress toward functional and medical  goals  Care Tool:  Bathing    Body parts bathed by patient: Right arm, Left arm, Chest, Abdomen, Front perineal area, Buttocks, Right upper leg, Left upper leg, Face, Right lower leg, Left lower leg   Body parts bathed by helper: Right lower leg, Left lower leg     Bathing assist Assist Level: Supervision/Verbal cueing     Upper Body Dressing/Undressing Upper body dressing   What is the patient wearing?: Pull over shirt    Upper body assist Assist Level: Independent    Lower Body Dressing/Undressing Lower body dressing      What is the patient wearing?: Pants, Incontinence brief     Lower body assist Assist for lower body dressing: Supervision/Verbal cueing     Toileting Toileting    Toileting assist Assist for toileting: Supervision/Verbal cueing     Transfers Chair/bed transfer  Transfers assist     Chair/bed transfer assist level: Contact Guard/Touching assist     Locomotion Ambulation   Ambulation assist      Assist level: Contact Guard/Touching assist Assistive device: No Device Max distance: 331ft   Walk 10 feet activity   Assist     Assist level: Contact Guard/Touching assist Assistive device: No Device   Walk 50 feet activity   Assist    Assist level: Contact Guard/Touching assist Assistive device: No Device  Walk 150 feet activity   Assist    Assist level: Contact Guard/Touching assist Assistive device: No Device    Walk 10 feet on uneven surface  activity   Assist Walk 10 feet on uneven surfaces activity did not occur: Safety/medical concerns   Assist level: Contact Guard/Touching assist Assistive device: Other (comment) (no device)   Wheelchair     Assist Is the patient using a wheelchair?: Yes Type of Wheelchair: Manual    Wheelchair assist level: Dependent - Patient 0% Max wheelchair distance: 150'    Wheelchair 50 feet with 2 turns activity    Assist        Assist Level: Dependent -  Patient 0%   Wheelchair 150 feet activity     Assist      Assist Level: Dependent - Patient 0%   Blood pressure 128/82, pulse 69, temperature 98.2 F (36.8 C), temperature source Oral, resp. rate 17, height 5\' 3"  (1.6 m), weight 70.4 kg, SpO2 100 %.  Medical Problem List and Plan: 1. 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  2.  Impaired mobility: continue Lovenox             -antiplatelet therapy: ASA/Brilinta.  3. Pain Management: Tylenol prn. 11/24- denies pain- con't regimen  4. Mood: LCSW to follow for evaluation and support.              -antipsychotic agents: N/A 5. Neuropsych: This patient is capable of making decisions on her own behalf. 6. Skin/Wound Care: Routine pressure relief measures.  7. Fluids/Electrolytes/Nutrition: Monitor I/O. Check CMET in am. 8. Dyslipidemia: Continue Lipitor daily.' 9. Constipation: Resolved and on Senna S 11/29- LBM 2 days ago- doesn't want meds/intervention  12/1- LBM Tuesday- con't regimen- refuses additional meds.  10. Tobacco abuse: Continue nicotine patch.  11. Azotemia  11/24- BUN 41 up from 28- will recheck in AM and push fluids. 11/25- BUN down from 41 to 31 and Cr 1.06- will recheck Monday and monitor  11/26: placed nursing order to encourage 6-8 glasses of water per day 11/28- BUN down to 26- Cr 0.94- con't pushing fluids 12/2- will need to have PCP recheck in next few weeks to 6 weeks.  12. HTN: add magnesium gluconate 250mg  HS  11/29- BP doing better- con't regimen 13. Overweight BMI 28.12: provide dietary education.     LOS: 9 days A FACE TO FACE EVALUATION WAS PERFORMED  Andrea Williamson 10/16/2021, 8:46 AM

## 2021-10-19 DIAGNOSIS — I10 Essential (primary) hypertension: Secondary | ICD-10-CM

## 2021-10-19 DIAGNOSIS — K5900 Constipation, unspecified: Secondary | ICD-10-CM

## 2021-10-21 ENCOUNTER — Ambulatory Visit: Payer: Medicare Other | Attending: Physical Medicine and Rehabilitation

## 2021-10-21 ENCOUNTER — Other Ambulatory Visit: Payer: Self-pay

## 2021-10-21 VITALS — BP 138/88

## 2021-10-21 DIAGNOSIS — R269 Unspecified abnormalities of gait and mobility: Secondary | ICD-10-CM | POA: Diagnosis not present

## 2021-10-21 DIAGNOSIS — R2681 Unsteadiness on feet: Secondary | ICD-10-CM | POA: Diagnosis not present

## 2021-10-21 DIAGNOSIS — R278 Other lack of coordination: Secondary | ICD-10-CM | POA: Insufficient documentation

## 2021-10-21 DIAGNOSIS — M6281 Muscle weakness (generalized): Secondary | ICD-10-CM

## 2021-10-21 DIAGNOSIS — R262 Difficulty in walking, not elsewhere classified: Secondary | ICD-10-CM | POA: Diagnosis not present

## 2021-10-21 DIAGNOSIS — I63522 Cerebral infarction due to unspecified occlusion or stenosis of left anterior cerebral artery: Secondary | ICD-10-CM | POA: Diagnosis not present

## 2021-10-21 DIAGNOSIS — R2689 Other abnormalities of gait and mobility: Secondary | ICD-10-CM

## 2021-10-21 NOTE — Therapy (Addendum)
[place] MAIN [facility] SERVICES [address] [place], [place], [address] Phone: [phone]   Fax:  [phone]  Physical Therapy Evaluation  Patient Details  Name: Andrea Williamson MRN: [id number] Date of Birth: 1951-09-26 Referring Provider (PT): Reesa Chew, [place]  Encounter Date: 10/21/2021   PT End of Session - 10/21/21 1703     Visit Number 1    Number of Visits 25    Date for PT Re-Evaluation 01/13/22    Authorization Time Period Eval performed: 10/21/21    Progress Note Due on Visit 10    PT Start Time 1517    PT Stop Time 1611    PT Time Calculation (min) 54 min    Equipment Utilized During Treatment Gait belt    Activity Tolerance Patient tolerated treatment well    Behavior During Therapy [facility] for tasks assessed/performed             History reviewed. No pertinent past medical history.  Past Surgical History:  Procedure Laterality Date   ANKLE SURGERY Left 2008   IR CT HEAD LTD  10/05/2021   IR INTRA CRAN STENT  10/01/2021   IR PERCUTANEOUS ART THROMBECTOMY/INFUSION INTRACRANIAL INC DIAG ANGIO  10/01/2021   IR US GUIDE VASC ACCESS RIGHT  10/01/2021   RADIOLOGY WITH ANESTHESIA N/A 10/01/2021   Procedure: IR WITH ANESTHESIA;  Surgeon: Radiologist, Medication, MD;  Location: [facility];  Service: Radiology;  Laterality: N/A;    Vitals:   10/21/21 1627  BP: 138/88      Subjective Assessment - 10/21/21 1517     Subjective Pt is a 70 y/o female who experienced a fall at the bowling alley 2-3 weeks ago, stating she felt like her leg gave out on her. She was able to get up and continue bowling and went about her evening like usual. She woke up that night on the floor and was unable to move her right leg. Pt was admitted to the ED 10/01/21 and discharged to inpatient rehab 10/07/21. Pt remained in inpatient rehab until discharge on 10/16/21. Pt reports being independent in all ADLs prior to her stroke, and has been using her walker to get  around since leaving inpatient rehab. She lives at home with her husband who is able to help her get around. She is accompanied by her husband and brother in law.    Patient is accompained by: Family member    Pertinent History Pt is a 70 y/o female presenting with R LE strength and balance deficits. Pt is s/p L hemisphere CVA, admitted to the ED 10/01/21. Imaging included MRI scans on 10/02/21 yielding "Redemonstrated infarcts in the left ACA and ACA/PCA watershed  territory, with one new area of restricted diffusion in the medial right parietal lobe, likely an additional acute infarct, status post  A2 stenting. A small amount of associated petechial hemorrhage is  noted within the medial left frontal lobe, without significant  hemorrhagic transformation." per Dr. Merilyn Baba, MD. Pt has PMH that includes HLD.    Limitations Walking;Standing    How long can you sit comfortably? "a long time"    How long can you stand comfortably? 30 minutes to an hour    How long can you walk comfortably? 7 minutes    Diagnostic tests CT (10/01/21) - 1. The right common femoral artery has normal caliber, adequate for   vascular access.   2. 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   3. Intracranial  atherosclerotic disease with multifocal areas of   mild stenosis in the left ACA and MCA vascular trees. MRI HEAD WITHOUT CONTRAST (10/12/21) - "Redemonstrated infarcts in the left ACA and ACA/PCA watershed  territory, with one new area of restricted diffusion in the medial right parietal lobe, likely an additional acute infarct, status post  A2 stenting. 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 No/denies                [facility] PT Assessment - 10/21/21 1526       Assessment    Medical Diagnosis Acute stroke due to ischemia    Referring Provider (PT) Reesa Chew, PA-C    Onset Date/Surgical Date 10/01/21    Hand Dominance Right    Next MD Visit 12/15    Prior Therapy Some in      Precautions   Precautions [place] residence    Living Arrangements Spouse/significant other    Available Help at Discharge Available PRN/intermittently    Type of [place] to enter    [place] of Steps 2 in the front 4 in the garage    [place] reach both    [place] - 2 wheels;Shower seat;Grab bars - toilet;Grab bars - tub/shower      Prior Function   Level of Independence Independent      Berg Balance Test   Sit to Stand Able to stand  independently using hands    Standing Unsupported Able to stand safely 2 minutes    Sitting with Back Unsupported but Feet Supported on Floor or Stool Able to sit safely and securely 2 minutes    Stand to Sit Controls descent by using hands    Transfers Able to transfer safely, definite need of hands    Standing Unsupported with Eyes Closed Able to stand 10 seconds safely    Standing Unsupported with Feet Together Able to place feet together independently and stand 1 minute safely    From Standing, Reach Forward with Outstretched Arm Can reach forward >12 cm safely (5")    From Standing Position, Pick up Object from Floor Able to pick up shoe safely and easily    From Standing Position, Turn to Look Behind Over each Shoulder Looks behind from both sides and weight shifts well    Turn 360 Degrees Able to turn 360 degrees safely one side only in 4 seconds or less    Standing Unsupported, Alternately Place Feet on Step/Stool Able to complete 4 steps without aid or supervision    Standing Unsupported, One Foot in Front Able to take small step independently and hold 30 seconds    Standing on One Leg Tries to  lift leg/unable to hold 3 seconds but remains standing independently    Total Score 44            BP: 138/88 mmHg  SUBJECTIVE History: see subjective Red flags (bowel/bladder changes, saddle paresthesia, personal history of cancer, chills/fever, night sweats, unrelenting pain): Negative Hobbies: bowling, caring for pets Goals: get back to bowling by January, be able to independently care  for pets   OBJECTIVE  Musculoskeletal Tremor: Present BUE Bulk: Normal Tone: Normal, no clonus  Posture No gross abnormalities noted in standing or seated posture  Gait Pt with mild R hip drop, shorter step length on the L   Strength R/L 4+/5 Hip flexion 4+/5 Hip abduction 4/5 Hip adduction 5/5 Knee extension 5/5 Knee flexion 4+/5 Ankle Plantarflexion 4+/5 Ankle Dorsiflexion   NEUROLOGICAL:  Sensation Grossly intact to light touch bilateral LEs as determined by testing dermatomes L2-S2. Proprioception and hot/cold testing deferred on this date  Coordination/Cerebellar Finger to Nose: WNL with mild tremor observed, able to hit the target Heel to Shin: WNL Rapid alternating movements: WNL  FUNCTIONAL OUTCOME MEASURES  BERG: 44/56, fall risk, in need of intervention (see flowsheet)  5xSTS: 16.3 seconds without UE support, fall risk >15 seconds  10MWT: 0.82 m/s with 2WW and CGA  FOTO: 58% (goal of 71%)  Neuromuscular Re-ed: (unbilled)  On airex at support surface with CGA:  NBOS, EO 2 x 30 sec, pt displays mild lateral sway NBOS, EO with horizontal head turns 2 x 30 seconds, pt displays difficulty turning head to the R leading to LOB requiring UE support and Min A from SPT NBOS, EC x 30 sec, pt displays mild sway and requires intermittent UE support.  ASSESSMENT Clinical Impression: Pt is a pleasant 69 y/o woman referred to physical therapy for difficulty with balance and muscle weakness following L hemisphere CVA. Pt presents with deficits in strength in the RLE,  grossly 4-4+/5. Pt scored 44/56 on the BERG and 16.3 seconds on the 5xSTS displaying a risk of falls and decreased muscular power. Her 10MWT time of 0.82 with RW indicates she is in the community ambulator category for patient's with CVA. However, she would benefit from working toward ambulation with LRAD to achieve goal of getting back to bowling. Pt scored 58% on FOTO displaying perceived difficulty in functional mobility for ADLs. The pt will benefit from skilled PT services to address these deficits, improve balance, decrease risk for future falls, and improve QOL.  PLAN Next Visit: initiate HEP, complete 6MWT HEP: deferred to next visit     Objective measurements completed on examination: See above findings.                PT Education - 10/21/21 1702     Education Details Evaluation testing findings    Person(s) Educated Patient;Spouse    Methods Explanation    Comprehension Verbalized understanding              PT Short Term Goals - 10/22/21 0759       PT SHORT TERM GOAL #1   Title Pt will be independent with HEP in order to improve strength and balance in order to decrease fall risk and improve function at home and work.    Baseline 10/21/21: HEP deferred to next visit    Time 6    Period Weeks    Status New    Target Date 12/02/21               PT Long Term Goals - 10/22/21 0800       PT LONG TERM GOAL #1   Title Pt will improve FOTO score to >71% to show improved functional mobility with ADLs    Baseline 10/21/21: 58%    Time 12    Period Weeks    Status New    Target Date 01/13/22      PT LONG TERM GOAL #  2   Title Pt will improve BERG by at least 3 points in order to demonstrate clinically significant improvement in balance.    Baseline 10/21/21: 44/56    Time 12    Period Weeks    Status New    Target Date 01/13/22      PT LONG TERM GOAL #3   Title Pt will decrease 5TSTS by at least 3 seconds in order to demonstrate clinically  significant improvement in LE strength.    Baseline 10/21/21: 16.3 sec without UE support    Time 12    Period Weeks    Status New    Target Date 01/13/22      PT LONG TERM GOAL #4   Title Pt will improve 10MWT gait speed to >1.0 m/s with least restrictive AD to show clinically significant improvement and decrease risk of falls.    Baseline 10/21/21: 0.82 m/s with 2WW and CGA    Time 12    Period Weeks    Status New    Target Date 01/13/22      PT LONG TERM GOAL #5   Title Pt will increase 6MWT by at least 50m (132ft) in order to demonstrate clinically significant improvement in cardiopulmonary endurance and community ambulation    Baseline 10/21/21: deferred to next visit    Time 12    Period Weeks    Status New    Target Date 01/13/22                    Plan - 10/21/21 1705     Clinical Impression Statement Pt is a pleasant 70 y/o woman referred to physical therapy for difficulty with balance and muscle weakness following L hemisphere CVA. Pt presents with deficits in strength in the RLE, grossly 4-4+/5. Pt scored 44/56 on the BERG and 16.3 seconds on the 5xSTS displaying a risk of falls and decreased muscular power. Her 10MWT time of 0.82 with RW indicates she is in the community ambulator category for patient's with CVA. However, she would benefit from working toward ambulation with LRAD to achieve goal of getting back to bowling. Pt scored 58% on FOTO displaying perceived difficulty in functional mobility for ADLs. The pt will benefit from skilled PT services to address these deficits, improve balance, decrease risk for future falls, and improve QOL.    Personal Factors and Comorbidities Age;Comorbidity 1;Sex;Time since onset of injury/illness/exacerbation;Fitness;Social Background    Comorbidities HLD    Examination-Activity Limitations Bathing;Bed Mobility;Caring for Others;Reach Overhead;Hygiene/Grooming;Self Feeding;Squat;Stairs;Transfers;Toileting;Sleep;Locomotion  Level;Lift;Stand    Examination-Participation Restrictions Cleaning;Community Activity;Shop;Laundry;Meal Prep;Volunteer;Driving;Interpersonal Relationship    Stability/Clinical Decision Making Stable/Uncomplicated    Clinical Decision Making Moderate    Rehab Potential Good    PT Frequency 2x / week    PT Duration 12 weeks    PT Treatment/Interventions ADLs/Self Care Home Management;Cryotherapy;Electrical Stimulation;Moist Heat;Ultrasound;Gait training;Stair training;DME Instruction;Functional mobility training;Therapeutic activities;Therapeutic exercise;Balance training;Neuromuscular re-education;Patient/family education;Manual techniques;Energy conservation;Passive range of motion;Joint Manipulations;Splinting;Taping;Vestibular;Visual/perceptual remediation/compensation;Wheelchair mobility training;[place]alith Repostioning    PT Next Visit Plan 6MWT to assess endurance, initiate HEP    PT Home Exercise Plan deferred to next visit    Consulted and Agree with Plan of Care Patient             Patient will benefit from skilled therapeutic intervention in order to improve the following deficits and impairments:  Abnormal gait, Decreased activity tolerance, Decreased endurance, Decreased knowledge of use of DME, Decreased strength, Impaired sensation, Hypomobility, Decreased range of motion, Impaired UE functional use, Improper  body mechanics, Pain, Decreased balance, Decreased coordination, Decreased mobility, Decreased safety awareness, Difficulty walking, Impaired flexibility, Postural dysfunction  Visit Diagnosis: Unsteadiness on feet  Muscle weakness (generalized)  Difficulty in walking, not elsewhere classified  Other abnormalities of gait and mobility     Problem List Patient Active Problem List   Diagnosis Date Noted   HTN (hypertension) 10/19/2021   Constipation 10/19/2021   Elevated cholesterol 10/15/2021   Acute renal injury ([place]) 10/15/2021   Acute ischemic left ACA stroke  ([place]) 10/07/2021   Stroke (cerebrum) ([place]) 10/01/2021  Ricard Dillon PT, DPT This entire session was performed under direct supervision and direction of a licensed therapist/therapist assistant . I have personally read, edited and approve of the note as written.   Karn Cassis, SPT  10/22/2021, 3:33 PM  [place] MAIN [facility] SERVICES [address] [place], [place], [address] Phone: [phone]   Fax:  [phone]  Name: Andrea Williamson MRN: [id number] Date of Birth: [birth year]

## 2021-10-23 ENCOUNTER — Ambulatory Visit: Payer: Medicare Other | Admitting: Cardiology

## 2021-10-26 ENCOUNTER — Other Ambulatory Visit (HOSPITAL_COMMUNITY): Payer: Self-pay

## 2021-10-27 ENCOUNTER — Ambulatory Visit: Payer: Medicare Other

## 2021-10-27 ENCOUNTER — Other Ambulatory Visit: Payer: Self-pay

## 2021-10-27 DIAGNOSIS — R269 Unspecified abnormalities of gait and mobility: Secondary | ICD-10-CM | POA: Diagnosis not present

## 2021-10-27 DIAGNOSIS — I63522 Cerebral infarction due to unspecified occlusion or stenosis of left anterior cerebral artery: Secondary | ICD-10-CM | POA: Diagnosis not present

## 2021-10-27 DIAGNOSIS — R2681 Unsteadiness on feet: Secondary | ICD-10-CM

## 2021-10-27 DIAGNOSIS — R2689 Other abnormalities of gait and mobility: Secondary | ICD-10-CM | POA: Diagnosis not present

## 2021-10-27 DIAGNOSIS — M6281 Muscle weakness (generalized): Secondary | ICD-10-CM

## 2021-10-27 DIAGNOSIS — R278 Other lack of coordination: Secondary | ICD-10-CM | POA: Diagnosis not present

## 2021-10-27 DIAGNOSIS — R262 Difficulty in walking, not elsewhere classified: Secondary | ICD-10-CM | POA: Diagnosis not present

## 2021-10-27 NOTE — Therapy (Signed)
[place] MAIN [facility] SERVICES [address] [place], [place], [address] Phone: [phone]   Fax:  [phone]  Physical Therapy Treatment  Patient Details  Name: Andrea Williamson MRN: [id number] Date of Birth: 08-Jul-1951 Referring Provider (PT): Reesa Chew, [place]   Encounter Date: 10/27/2021   PT End of Session - 10/27/21 1438     Visit Number 2    Number of Visits 25    Date for PT Re-Evaluation 01/13/22    Authorization Time Period Eval performed: 10/21/21    Progress Note Due on Visit 10    PT Start Time 1019    PT Stop Time 1100    PT Time Calculation (min) 41 min    Equipment Utilized During Treatment Gait belt    Activity Tolerance Patient tolerated treatment well    Behavior During Therapy [facility] for tasks assessed/performed             History reviewed. No pertinent past medical history.  Past Surgical History:  Procedure Laterality Date   ANKLE SURGERY Left 2008   IR CT HEAD LTD  10/05/2021   IR INTRA CRAN STENT  10/01/2021   IR PERCUTANEOUS ART THROMBECTOMY/INFUSION INTRACRANIAL INC DIAG ANGIO  10/01/2021   IR US GUIDE VASC ACCESS RIGHT  10/01/2021   RADIOLOGY WITH ANESTHESIA N/A 10/01/2021   Procedure: IR WITH ANESTHESIA;  Surgeon: Radiologist, Medication, MD;  Location: [place];  Service: Radiology;  Laterality: N/A;    There were no vitals filed for this visit.   Subjective Assessment - 10/27/21 1435     Subjective Patient reports she feels like she is making daily progress- Walking some without a walker at home- She denied any falls and denies any pain. States she is supposed to go bowling in her league later this week.    Patient is accompained by: Family member    Pertinent History Pt is a 70 y/o female presenting with R LE strength and balance deficits. Pt is s/p L hemisphere CVA, admitted to the ED 10/01/21. Imaging included MRI scans on 10/02/21 yielding "Redemonstrated infarcts in the left ACA and ACA/PCA watershed   territory, with one new area of restricted diffusion in the medial right parietal lobe, likely an additional acute infarct, status post  A2 stenting. A small amount of associated petechial hemorrhage is  noted within the medial left frontal lobe, without significant  hemorrhagic transformation." per Dr. Merilyn Baba, MD. Pt has PMH that includes HLD.    Limitations Walking;Standing    How long can you sit comfortably? "a long time"    How long can you stand comfortably? 30 minutes to an hour    How long can you walk comfortably? 7 minutes    Diagnostic tests CT (10/01/21) - 1. The right common femoral artery has normal caliber, adequate for   vascular access.   2. 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   3. Intracranial atherosclerotic disease with multifocal areas of   mild stenosis in the left ACA and MCA vascular trees. MRI HEAD WITHOUT CONTRAST (10/12/21) - "Redemonstrated infarcts in the left ACA and ACA/PCA watershed  territory, with one new area of restricted diffusion in the medial right parietal lobe, likely an additional acute infarct, status post  A2 stenting. A small amount of associated petechial hemorrhage is  noted within the medial left frontal lobe, without significant  hemorrhagic transformation."    Patient Stated Goals wants to be able to go back to bowling  in january, taking care of her dog and cat    Currently in Pain? No/denies            INTERVENTIONS:   Assessed 6 min walk test= 790 feet with front wheeled walker- No SOB or LOB- Did observe some poor heel to toe sequencing and patient pushing walker out too far in front.   Therapeutic exercises:   Instructed patient in Sit to stand as LE strengthening and educated in technique- Scooting forward, crossing arms over shoulder, leaning forward (nose past toes) and patient was then able to perform 10 reps without UE support- Nervous initially yet much improved with confidence and able to complete without report of pain  or difficulty.   Instructed in standing Hip flex/abd over cone with no UE support x 12 reps each direction. Patient initially unsteady and difficulty maintaining balance when prompted to take a wider step. She quickly adapted and improved her technique requiring no UE Support.   Neuromuscular re-ed:   Instructed patient in tandem standing (in corner at home at support bar today with CGA) - hold x 10 sec - Instructed to make sure she has someone at home with her while performing x 4 each LE.   Instructed in single leg stance- Hold 3-5 sec today with increased unsteadiness- Improved slightly but only able to hold for 5 sec at max today.       Education provided throughout session via VC/TC and demonstration to facilitate movement at target joints and correct muscle activation for all testing and exercises performed.    Access Code: [email or web address] URL: [email or web address] Date: 10/27/2021 Prepared by: Sande Brothers  Exercises Seated Hip Abduction with Resistance - 1 x daily - 3 x weekly - 3 sets - 10-15 reps - 2 hold Sit to Stand - 1 x daily - 3 x weekly - 3 sets - 10-12 reps - 2 hold Standing Tandem Balance with Counter Support - 1 x daily - 7 x weekly - 3 sets - 30 hold Single Leg Stance - 1 x daily - 7 x weekly - 10 sets - 5-10 hold Sidestepping - 1 x daily - 3 x weekly - 3 sets - 10 reps                   PT Education - 10/27/21 1437     Education Details Exercise technique and body mechanics    Person(s) Educated Patient    Methods Explanation;Demonstration;Tactile cues;Verbal cues;Handout    Comprehension Verbalized understanding;Returned demonstration;Verbal cues required;Tactile cues required;Need further instruction              PT Short Term Goals - 10/22/21 0759       PT SHORT TERM GOAL #1   Title Pt will be independent with HEP in order to improve strength and balance in order to decrease fall risk and improve function at home and  work.    Baseline 10/21/21: HEP deferred to next visit    Time 6    Period Weeks    Status New    Target Date 12/02/21               PT Long Term Goals - 10/27/21 1440       PT LONG TERM GOAL #1   Title Pt will improve FOTO score to >71% to show improved functional mobility with ADLs    Baseline 10/21/21: 58%    Time 12    Period Weeks    Status New  Target Date 01/13/22      PT LONG TERM GOAL #2   Title Pt will improve BERG by at least 3 points in order to demonstrate clinically significant improvement in balance.    Baseline 10/21/21: 44/56    Time 12    Period Weeks    Status New    Target Date 01/13/22      PT LONG TERM GOAL #3   Title Pt will decrease 5TSTS by at least 3 seconds in order to demonstrate clinically significant improvement in LE strength.    Baseline 10/21/21: 16.3 sec without UE support    Time 12    Period Weeks    Status New    Target Date 01/13/22      PT LONG TERM GOAL #4   Title Pt will improve 10MWT gait speed to >1.0 m/s with least restrictive AD to show clinically significant improvement and decrease risk of falls.    Baseline 10/21/21: 0.82 m/s with 2WW and CGA    Time 12    Period Weeks    Status New    Target Date 01/13/22      PT LONG TERM GOAL #5   Title Pt will increase 6MWT by at least 38m (137ft) in order to demonstrate clinically significant improvement in cardiopulmonary endurance and community ambulation    Baseline 10/21/21: deferred to next visit; 10/27/2021= 790 feet with front wheeled walker    Time 12    Period Weeks    Status New    Target Date 01/13/22                   Plan - 10/27/21 1439     Clinical Impression Statement Patient performed well with all activities- Challenged with balance activities that were added to home program today. She presented with no shortness of breath or expressed fatigue but did exhibit some gait dysfunction including decreased step length, decreased heel to toe sequencing,  and pushing walker out too far in front. She did well with instruction for some LE and balance activities for HEP today - able to verbalize understanding and return demonstration well. The pt will benefit from continued skilled PT interventions to improve LE strength/coordination, improve balance, decrease risk for future falls, and improve quality of life.    Personal Factors and Comorbidities Age;Comorbidity 1;Sex;Time since onset of injury/illness/exacerbation;Fitness;Social Background    Comorbidities HLD    Examination-Activity Limitations Bathing;Bed Mobility;Caring for Others;Reach Overhead;Hygiene/Grooming;Self Feeding;Squat;Stairs;Transfers;Toileting;Sleep;Locomotion Level;Lift;Stand    Examination-Participation Restrictions Cleaning;Community Activity;Shop;Laundry;Meal Prep;Volunteer;Driving;Interpersonal Relationship    Stability/Clinical Decision Making Stable/Uncomplicated    Rehab Potential Good    PT Frequency 2x / week    PT Duration 12 weeks    PT Treatment/Interventions ADLs/Self Care Home Management;Cryotherapy;Electrical Stimulation;Moist Heat;Ultrasound;Gait training;Stair training;DME Instruction;Functional mobility training;Therapeutic activities;Therapeutic exercise;Balance training;Neuromuscular re-education;Patient/family education;Manual techniques;Energy conservation;Passive range of motion;Joint Manipulations;Splinting;Taping;Vestibular;Visual/perceptual remediation/compensation;Wheelchair mobility training;Canalith Repostioning    PT Next Visit Plan 6MWT to assess endurance, initiate HEP    PT Home Exercise Plan 10/27/2021=Access Code: [email or web address]    Consulted and Agree with Plan of Care Patient             Patient will benefit from skilled therapeutic intervention in order to improve the following deficits and impairments:  Abnormal gait, Decreased activity tolerance, Decreased endurance, Decreased knowledge of use of DME, Decreased strength, Impaired sensation,  Hypomobility, Decreased range of motion, Impaired UE functional use, Improper body mechanics, Pain, Decreased balance, Decreased coordination, Decreased mobility, Decreased safety awareness, Difficulty walking, Impaired flexibility, Postural dysfunction  Visit Diagnosis: Abnormality of gait and mobility  Difficulty in walking, not elsewhere classified  Muscle weakness (generalized)  Unsteadiness on feet     Problem List Patient Active Problem List   Diagnosis Date Noted   HTN (hypertension) 10/19/2021   Constipation 10/19/2021   Elevated cholesterol 10/15/2021   Acute renal injury ([place]) 10/15/2021   Acute ischemic left ACA stroke ([place]) 10/07/2021   Stroke (cerebrum) ([place]) 10/01/2021    Lewis Moccasin, PT 10/27/2021, 3:18 PM  [place] MAIN [facility] SERVICES [address] [place], [place], [address] Phone: [phone]   Fax:  [phone]  Name: DONNAMARIA SHANDS MRN: [id number] Date of Birth: [birth year]

## 2021-10-29 ENCOUNTER — Ambulatory Visit: Payer: Medicare Other | Admitting: Internal Medicine

## 2021-10-30 ENCOUNTER — Other Ambulatory Visit: Payer: Self-pay

## 2021-10-30 ENCOUNTER — Ambulatory Visit: Payer: Medicare Other

## 2021-10-30 DIAGNOSIS — R278 Other lack of coordination: Secondary | ICD-10-CM | POA: Diagnosis not present

## 2021-10-30 DIAGNOSIS — R2681 Unsteadiness on feet: Secondary | ICD-10-CM

## 2021-10-30 DIAGNOSIS — R269 Unspecified abnormalities of gait and mobility: Secondary | ICD-10-CM

## 2021-10-30 DIAGNOSIS — M6281 Muscle weakness (generalized): Secondary | ICD-10-CM

## 2021-10-30 DIAGNOSIS — R262 Difficulty in walking, not elsewhere classified: Secondary | ICD-10-CM | POA: Diagnosis not present

## 2021-10-30 DIAGNOSIS — R2689 Other abnormalities of gait and mobility: Secondary | ICD-10-CM | POA: Diagnosis not present

## 2021-10-30 DIAGNOSIS — I63522 Cerebral infarction due to unspecified occlusion or stenosis of left anterior cerebral artery: Secondary | ICD-10-CM | POA: Diagnosis not present

## 2021-10-30 NOTE — Therapy (Signed)
[place] MAIN [facility] SERVICES [address] [place], [place], [address] Phone: [phone]   Fax:  [phone]  Physical Therapy Treatment  Patient Details  Name: Andrea Williamson MRN: [id number] Date of Birth: 28-Sep-1951 Referring Provider (PT): Reesa Chew, [place]   Encounter Date: 10/30/2021   PT End of Session - 10/30/21 9373     Visit Number 3    Number of Visits 25    Date for PT Re-Evaluation 01/13/22    Authorization Time Period Eval performed: 10/21/21    Progress Note Due on Visit 10    PT Start Time 0932    Equipment Utilized During Treatment Gait belt    Activity Tolerance Patient tolerated treatment well    Behavior During Therapy [facility] for tasks assessed/performed             No past medical history on file.  Past Surgical History:  Procedure Laterality Date   ANKLE SURGERY Left 2008   IR CT HEAD LTD  10/05/2021   IR INTRA CRAN STENT  10/01/2021   IR PERCUTANEOUS ART THROMBECTOMY/INFUSION INTRACRANIAL INC DIAG ANGIO  10/01/2021   IR US GUIDE VASC ACCESS RIGHT  10/01/2021   RADIOLOGY WITH ANESTHESIA N/A 10/01/2021   Procedure: IR WITH ANESTHESIA;  Surgeon: Radiologist, Medication, MD;  Location: [place];  Service: Radiology;  Laterality: N/A;    There were no vitals filed for this visit.   Subjective Assessment - 10/30/21 0935     Subjective Patient reports she did try to go bowling the other day- No balance issues per her report but did have a lot trouble with UE weakness and rolling the ball- Reports also very tiring. She reports compliance with HEP and states able to perform up to 25 sit to stand without UE support.    Patient is accompained by: Family member    Pertinent History Pt is a 70 y/o female presenting with R LE strength and balance deficits. Pt is s/p L hemisphere CVA, admitted to the ED 10/01/21. Imaging included MRI scans on 10/02/21 yielding "Redemonstrated infarcts in the left ACA and ACA/PCA watershed   territory, with one new area of restricted diffusion in the medial right parietal lobe, likely an additional acute infarct, status post  A2 stenting. A small amount of associated petechial hemorrhage is  noted within the medial left frontal lobe, without significant  hemorrhagic transformation." per Dr. Merilyn Baba, MD. Pt has PMH that includes HLD.    Limitations Walking;Standing    How long can you sit comfortably? "a long time"    How long can you stand comfortably? 30 minutes to an hour    How long can you walk comfortably? 7 minutes    Diagnostic tests CT (10/01/21) - 1. The right common femoral artery has normal caliber, adequate for   vascular access.   2. 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   3. Intracranial atherosclerotic disease with multifocal areas of   mild stenosis in the left ACA and MCA vascular trees. MRI HEAD WITHOUT CONTRAST (10/12/21) - "Redemonstrated infarcts in the left ACA and ACA/PCA watershed  territory, with one new area of restricted diffusion in the medial right parietal lobe, likely an additional acute infarct, status post  A2 stenting. A small amount of associated petechial hemorrhage is  noted within the medial left frontal lobe, without significant  hemorrhagic transformation."    Patient Stated Goals wants to be able to go back to bowling in january,  taking care of her dog and cat    Currently in Pain? No/denies             INTERVENTIONS:   Neuromuscluar re-ed:  -Tandem gait x 4 trips (down and back) in // bars.  Initially required intermittent UE support but did improve with no UE support after 2 trials.   - Forward step over orange hurdle and 1/2 foam roll x 3 (positioned on floor in between // bars) - no UE - Challenging to perform reciprocal steps over but did improve with practice  - side step over orange hurdle and 1/2 foam roll x 4 (positioned on floor in between // bars) - no UE - Challenging  and patient with difficulty with balance and  taking wider step.   - Resistive gait (matrix cable system at 12.5 lb) - x 5 forward and backward- Increased Verbal cues to perform and maintain balance - cues for increased step length. Patient was initially unsteady but did improve with practice and did endorse increase fatigue upon completion.    Education provided throughout session via VC/TC and demonstration to facilitate movement at target joints and correct muscle activation for all testing and exercises performed.    Patient performed well with instruction in balance exercises today- Able to respond to take a longer or wide step as appropriate. She was limited some by unsteadiness and fatigue. She was encouraged to progress her HEP to tandem walking along kitchen counter. The pt will benefit from continued skilled PT interventions to improve LE strength/coordination, improve balance, decrease risk for future falls, and improve quality of life                     PT Education - 10/30/21 0937     Education Details Exercise technique    Person(s) Educated Patient    Methods Explanation;Demonstration;Tactile cues;Verbal cues    Comprehension Verbalized understanding;Returned demonstration;Verbal cues required;Tactile cues required;Need further instruction              PT Short Term Goals - 10/22/21 0759       PT SHORT TERM GOAL #1   Title Pt will be independent with HEP in order to improve strength and balance in order to decrease fall risk and improve function at home and work.    Baseline 10/21/21: HEP deferred to next visit    Time 6    Period Weeks    Status New    Target Date 12/02/21               PT Long Term Goals - 10/27/21 1440       PT LONG TERM GOAL #1   Title Pt will improve FOTO score to >71% to show improved functional mobility with ADLs    Baseline 10/21/21: 58%    Time 12    Period Weeks    Status New    Target Date 01/13/22      PT LONG TERM GOAL #2   Title Pt will improve  BERG by at least 3 points in order to demonstrate clinically significant improvement in balance.    Baseline 10/21/21: 44/56    Time 12    Period Weeks    Status New    Target Date 01/13/22      PT LONG TERM GOAL #3   Title Pt will decrease 5TSTS by at least 3 seconds in order to demonstrate clinically significant improvement in LE strength.    Baseline 10/21/21: 16.3 sec  without UE support    Time 12    Period Weeks    Status New    Target Date 01/13/22      PT LONG TERM GOAL #4   Title Pt will improve 10MWT gait speed to >1.0 m/s with least restrictive AD to show clinically significant improvement and decrease risk of falls.    Baseline 10/21/21: 0.82 m/s with 2WW and CGA    Time 12    Period Weeks    Status New    Target Date 01/13/22      PT LONG TERM GOAL #5   Title Pt will increase 6MWT by at least 69m (177ft) in order to demonstrate clinically significant improvement in cardiopulmonary endurance and community ambulation    Baseline 10/21/21: deferred to next visit; 10/27/2021= 790 feet with front wheeled walker    Time 12    Period Weeks    Status New    Target Date 01/13/22                   Plan - 10/30/21 0939     Personal Factors and Comorbidities Age;Comorbidity 1;Sex;Time since onset of injury/illness/exacerbation;Fitness;Social Background    Comorbidities HLD    Examination-Activity Limitations Bathing;Bed Mobility;Caring for Others;Reach Overhead;Hygiene/Grooming;Self Feeding;Squat;Stairs;Transfers;Toileting;Sleep;Locomotion Level;Lift;Stand    Examination-Participation Restrictions Cleaning;Community Activity;Shop;Laundry;Meal Prep;Volunteer;Driving;Interpersonal Relationship    Stability/Clinical Decision Making Stable/Uncomplicated    Rehab Potential Good    PT Frequency 2x / week    PT Duration 12 weeks    PT Treatment/Interventions ADLs/Self Care Home Management;Cryotherapy;Electrical Stimulation;Moist Heat;Ultrasound;Gait training;Stair  training;DME Instruction;Functional mobility training;Therapeutic activities;Therapeutic exercise;Balance training;Neuromuscular re-education;Patient/family education;Manual techniques;Energy conservation;Passive range of motion;Joint Manipulations;Splinting;Taping;Vestibular;Visual/perceptual remediation/compensation;Wheelchair mobility training;Canalith Repostioning    PT Next Visit Plan 6MWT to assess endurance, initiate HEP    PT Home Exercise Plan 10/27/2021=Access Code: [email or web address]    Consulted and Agree with Plan of Care Patient             Patient will benefit from skilled therapeutic intervention in order to improve the following deficits and impairments:  Abnormal gait, Decreased activity tolerance, Decreased endurance, Decreased knowledge of use of DME, Decreased strength, Impaired sensation, Hypomobility, Decreased range of motion, Impaired UE functional use, Improper body mechanics, Pain, Decreased balance, Decreased coordination, Decreased mobility, Decreased safety awareness, Difficulty walking, Impaired flexibility, Postural dysfunction  Visit Diagnosis: Abnormality of gait and mobility  Difficulty in walking, not elsewhere classified  Muscle weakness (generalized)  Unsteadiness on feet     Problem List Patient Active Problem List   Diagnosis Date Noted   HTN (hypertension) 10/19/2021   Constipation 10/19/2021   Elevated cholesterol 10/15/2021   Acute renal injury ([place]) 10/15/2021   Acute ischemic left ACA stroke ([place]) 10/07/2021   Stroke (cerebrum) ([place]) 10/01/2021    Lewis Moccasin, PT 10/30/2021, 9:48 AM  [place] MAIN [facility] SERVICES [address] [place], [place], [address] Phone: [phone]   Fax:  [phone]  Name: CATALEIA GADE MRN: [id number] Date of Birth: 04/30/1951

## 2021-11-03 ENCOUNTER — Other Ambulatory Visit: Payer: Self-pay

## 2021-11-03 ENCOUNTER — Ambulatory Visit: Payer: Medicare Other

## 2021-11-03 DIAGNOSIS — M6281 Muscle weakness (generalized): Secondary | ICD-10-CM | POA: Diagnosis not present

## 2021-11-03 DIAGNOSIS — I63522 Cerebral infarction due to unspecified occlusion or stenosis of left anterior cerebral artery: Secondary | ICD-10-CM | POA: Diagnosis not present

## 2021-11-03 DIAGNOSIS — R262 Difficulty in walking, not elsewhere classified: Secondary | ICD-10-CM

## 2021-11-03 DIAGNOSIS — R2689 Other abnormalities of gait and mobility: Secondary | ICD-10-CM | POA: Diagnosis not present

## 2021-11-03 DIAGNOSIS — R269 Unspecified abnormalities of gait and mobility: Secondary | ICD-10-CM | POA: Diagnosis not present

## 2021-11-03 DIAGNOSIS — R2681 Unsteadiness on feet: Secondary | ICD-10-CM

## 2021-11-03 DIAGNOSIS — R278 Other lack of coordination: Secondary | ICD-10-CM | POA: Diagnosis not present

## 2021-11-03 NOTE — Therapy (Signed)
[place] MAIN [facility] SERVICES [address] [place], [place], [address] Phone: [phone]   Fax:  [phone]  Physical Therapy Treatment  Patient Details  Name: Andrea Williamson MRN: [id number] Date of Birth: 1951-02-13 Referring Provider (PT): Reesa Chew, [place]   Encounter Date: 11/03/2021   PT End of Session - 11/03/21 1027     Visit Number 5    Number of Visits 25    Date for PT Re-Evaluation 01/13/22    Authorization Time Period Eval performed: 10/21/21    Progress Note Due on Visit 10    PT Start Time [place] During Treatment Gait belt    Activity Tolerance Patient tolerated treatment well    Behavior During Therapy [facility] for tasks assessed/performed             History reviewed. No pertinent past medical history.  Past Surgical History:  Procedure Laterality Date   ANKLE SURGERY Left 2008   IR CT HEAD LTD  10/05/2021   IR INTRA CRAN STENT  10/01/2021   IR PERCUTANEOUS ART THROMBECTOMY/INFUSION INTRACRANIAL INC DIAG ANGIO  10/01/2021   IR US GUIDE VASC ACCESS RIGHT  10/01/2021   RADIOLOGY WITH ANESTHESIA N/A 10/01/2021   Procedure: IR WITH ANESTHESIA;  Surgeon: Radiologist, Medication, MD;  Location: [place];  Service: Radiology;  Laterality: N/A;    There were no vitals filed for this visit.   Subjective Assessment - 11/03/21 1022     Subjective Patient states she is frustrated over not getting better quickly. She does report that she was walking without her walker now.    Patient is accompained by: Family member    Pertinent History Pt is a 70 y/o female presenting with R LE strength and balance deficits. Pt is s/p L hemisphere CVA, admitted to the ED 10/01/21. Imaging included MRI scans on 10/02/21 yielding "Redemonstrated infarcts in the left ACA and ACA/PCA watershed  territory, with one new area of restricted diffusion in the medial right parietal lobe, likely an additional acute infarct, status post  A2  stenting. A small amount of associated petechial hemorrhage is  noted within the medial left frontal lobe, without significant  hemorrhagic transformation." per Dr. Merilyn Baba, MD. Pt has PMH that includes HLD.    Limitations Walking;Standing    How long can you sit comfortably? "a long time"    How long can you stand comfortably? 30 minutes to an hour    How long can you walk comfortably? 7 minutes    Diagnostic tests CT (10/01/21) - 1. The right common femoral artery has normal caliber, adequate for   vascular access.   2. 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   3. Intracranial atherosclerotic disease with multifocal areas of   mild stenosis in the left ACA and MCA vascular trees. MRI HEAD WITHOUT CONTRAST (10/12/21) - "Redemonstrated infarcts in the left ACA and ACA/PCA watershed  territory, with one new area of restricted diffusion in the medial right parietal lobe, likely an additional acute infarct, status post  A2 stenting. 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 No/denies              INTERVENTIONS:   Therapeutic  Exercises: all exercises performed near support bar  Lunge squat x 12 reps with 1 UE Support  Step tap (onto 2nd step- at stairs without UE support)  x 15 reps alt LE's   Neuromuscular re-education:   Step tap (Circle- with hedgehogs placed at hours of clock with prompts to lift LE onto called out hours of clock)  x several minutes focusing on each leg- Patient unsteady and more difficulty with proprioception of right LE.    Ladder drills - Forward- 1 foot per square down and back x 5 reps- VC for increased step length.   Lateral-both feet into each square- down and back x 5 each. Mild difficulty coordinating movement yet did  improve with practice.  *Patient reports as tiring   Pt educated throughout session about proper posture and technique with exercises. Improved exercise technique, movement at target joints, use of target muscles after min to mod verbal, visual, tactile cues.                   PT Education - 11/03/21 1023     Education Details Exercise technique    Person(s) Educated Patient    Methods Explanation;Demonstration;Tactile cues;Verbal cues    Comprehension Verbalized understanding;Returned demonstration;Verbal cues required;Need further instruction;Tactile cues required              PT Short Term Goals - 10/22/21 0759       PT SHORT TERM GOAL #1   Title Pt will be independent with HEP in order to improve strength and balance in order to decrease fall risk and improve function at home and work.    Baseline 10/21/21: HEP deferred to next visit    Time 6    Period Weeks    Status New    Target Date 12/02/21               PT Long Term Goals - 10/27/21 1440       PT LONG TERM GOAL #1   Title Pt will improve FOTO score to >71% to show improved functional mobility with ADLs    Baseline 10/21/21: 58%    Time 12    Period Weeks    Status New    Target Date 01/13/22      PT LONG TERM GOAL #2   Title Pt will improve BERG by at least 3 points in order to demonstrate clinically significant improvement in balance.    Baseline 10/21/21: 44/56    Time 12    Period Weeks    Status New    Target Date 01/13/22      PT LONG TERM GOAL #3   Title Pt will decrease 5TSTS by at least 3 seconds in order to demonstrate clinically significant improvement in LE strength.    Baseline 10/21/21: 16.3 sec without UE support    Time 12    Period Weeks    Status New    Target Date 01/13/22      PT LONG TERM GOAL #4   Title Pt will improve 10MWT gait speed to >1.0 m/s with least restrictive AD to show clinically significant improvement and decrease risk of falls.    Baseline  10/21/21: 0.82 m/s with 2WW and CGA    Time 12    Period Weeks    Status New    Target Date 01/13/22      PT LONG TERM GOAL #5   Title Pt will increase 6MWT by at least 19m (173ft) in order to  demonstrate clinically significant improvement in cardiopulmonary endurance and community ambulation    Baseline 10/21/21: deferred to next visit; 10/27/2021= 790 feet with front wheeled walker    Time 12    Period Weeks    Status New    Target Date 01/13/22                   Plan - 11/03/21 1028     Clinical Impression Statement Patient presents with good motivation to complete all therex and balance activities. She was challenged with star activity and foot placement as well as ladder drills demonstrating more unsteadiness with right LE.The pt will benefit from continued skilled PT interventions to improve LE strength/coordination, improve balance, decrease risk for future falls, and improve quality of life    Personal Factors and Comorbidities Age;Comorbidity 1;Sex;Time since onset of injury/illness/exacerbation;Fitness;Social Background    Comorbidities HLD    Examination-Activity Limitations Bathing;Bed Mobility;Caring for Others;Reach Overhead;Hygiene/Grooming;Self Feeding;Squat;Stairs;Transfers;Toileting;Sleep;Locomotion Level;Lift;Stand    Examination-Participation Restrictions Cleaning;Community Activity;Shop;Laundry;Meal Prep;Volunteer;Driving;Interpersonal Relationship    Stability/Clinical Decision Making Stable/Uncomplicated    Rehab Potential Good    PT Frequency 2x / week    PT Duration 12 weeks    PT Treatment/Interventions ADLs/Self Care Home Management;Cryotherapy;Electrical Stimulation;Moist Heat;Ultrasound;Gait training;Stair training;DME Instruction;Functional mobility training;Therapeutic activities;Therapeutic exercise;Balance training;Neuromuscular re-education;Patient/family education;Manual techniques;Energy conservation;Passive range of motion;Joint  Manipulations;Splinting;Taping;Vestibular;Visual/perceptual remediation/compensation;Wheelchair mobility training;Canalith Repostioning    PT Next Visit Plan Progress dynamic balance    PT Home Exercise Plan 10/27/2021=Access Code: [email or web address]    Consulted and Agree with Plan of Care Patient             Patient will benefit from skilled therapeutic intervention in order to improve the following deficits and impairments:  Abnormal gait, Decreased activity tolerance, Decreased endurance, Decreased knowledge of use of DME, Decreased strength, Impaired sensation, Hypomobility, Decreased range of motion, Impaired UE functional use, Improper body mechanics, Pain, Decreased balance, Decreased coordination, Decreased mobility, Decreased safety awareness, Difficulty walking, Impaired flexibility, Postural dysfunction  Visit Diagnosis: Abnormality of gait and mobility  Difficulty in walking, not elsewhere classified  Muscle weakness (generalized)  Unsteadiness on feet     Problem List Patient Active Problem List   Diagnosis Date Noted   HTN (hypertension) 10/19/2021   Constipation 10/19/2021   Elevated cholesterol 10/15/2021   Acute renal injury ([place]) 10/15/2021   Acute ischemic left ACA stroke ([place]) 10/07/2021   Stroke (cerebrum) ([place]) 10/01/2021    Lewis Moccasin, PT 11/04/2021, 1:05 PM  [place] MAIN [facility] SERVICES [address] [place], [place], [address] Phone: [phone]   Fax:  [phone]  Name: Andrea Williamson MRN: [id number] Date of Birth: Nov 06, 1951

## 2021-11-04 ENCOUNTER — Ambulatory Visit: Payer: Medicare Other

## 2021-11-04 DIAGNOSIS — M6281 Muscle weakness (generalized): Secondary | ICD-10-CM

## 2021-11-04 DIAGNOSIS — R2681 Unsteadiness on feet: Secondary | ICD-10-CM | POA: Diagnosis not present

## 2021-11-04 DIAGNOSIS — R262 Difficulty in walking, not elsewhere classified: Secondary | ICD-10-CM | POA: Diagnosis not present

## 2021-11-04 DIAGNOSIS — R278 Other lack of coordination: Secondary | ICD-10-CM | POA: Diagnosis not present

## 2021-11-04 DIAGNOSIS — R269 Unspecified abnormalities of gait and mobility: Secondary | ICD-10-CM | POA: Diagnosis not present

## 2021-11-04 DIAGNOSIS — R2689 Other abnormalities of gait and mobility: Secondary | ICD-10-CM | POA: Diagnosis not present

## 2021-11-04 DIAGNOSIS — I63522 Cerebral infarction due to unspecified occlusion or stenosis of left anterior cerebral artery: Secondary | ICD-10-CM | POA: Diagnosis not present

## 2021-11-04 NOTE — Therapy (Signed)
[place] MAIN [facility] SERVICES [address] [place], [place], [address] Phone: [phone]   Fax:  [phone]  Physical Therapy Treatment  Patient Details  Name: Andrea Williamson MRN: [id number] Date of Birth: 1951/06/11 Referring Provider (PT): Reesa Chew, [place]   Encounter Date: 11/04/2021   PT End of Session - 11/04/21 1713     Visit Number 6    Number of Visits 25    Date for PT Re-Evaluation 01/13/22    Authorization Time Period Eval performed: 10/21/21    Progress Note Due on Visit 10    PT Start Time 1045    PT Stop Time 1129    PT Time Calculation (min) 44 min    Equipment Utilized During Treatment Gait belt    Activity Tolerance Patient tolerated treatment well    Behavior During Therapy [facility] for tasks assessed/performed             History reviewed. No pertinent past medical history.  Past Surgical History:  Procedure Laterality Date   ANKLE SURGERY Left 2008   IR CT HEAD LTD  10/05/2021   IR INTRA CRAN STENT  10/01/2021   IR PERCUTANEOUS ART THROMBECTOMY/INFUSION INTRACRANIAL INC DIAG ANGIO  10/01/2021   IR US GUIDE VASC ACCESS RIGHT  10/01/2021   RADIOLOGY WITH ANESTHESIA N/A 10/01/2021   Procedure: IR WITH ANESTHESIA;  Surgeon: Radiologist, Medication, MD;  Location: [place];  Service: Radiology;  Laterality: N/A;    There were no vitals filed for this visit.   Subjective Assessment - 11/04/21 1042     Subjective Pt reports no falls recently but some stumbles. Pt reports stumble going down steps but says she held on to the handrails to prevent fall. Pt reports no pain.    Patient is accompained by: Family member    Pertinent History Pt is a 70 y/o female presenting with R LE strength and balance deficits. Pt is s/p L hemisphere CVA, admitted to the ED 10/01/21. Imaging included MRI scans on 10/02/21 yielding "Redemonstrated infarcts in the left ACA and ACA/PCA watershed  territory, with one new area of restricted  diffusion in the medial right parietal lobe, likely an additional acute infarct, status post  A2 stenting. A small amount of associated petechial hemorrhage is  noted within the medial left frontal lobe, without significant  hemorrhagic transformation." per Dr. Merilyn Baba, MD. Pt has PMH that includes HLD.    Limitations Walking;Standing    How long can you sit comfortably? "a long time"    How long can you stand comfortably? 30 minutes to an hour    How long can you walk comfortably? 7 minutes    Diagnostic tests CT (10/01/21) - 1. The right common femoral artery has normal caliber, adequate for   vascular access.   2. 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   3. Intracranial atherosclerotic disease with multifocal areas of   mild stenosis in the left ACA and MCA vascular trees. MRI HEAD WITHOUT CONTRAST (10/12/21) - "Redemonstrated infarcts in the left ACA and ACA/PCA watershed  territory, with one new area of restricted diffusion in the medial right parietal lobe, likely an additional acute infarct, status post  A2 stenting. A small amount of associated petechial hemorrhage is  noted within the medial left frontal lobe, without significant  hemorrhagic transformation."    Patient Stated Goals wants to be able to go back to bowling in january, taking care of her dog and cat  Currently in Pain? No/denies            INTERVENTIONS  Gait belt donned and CGA provided throughout unless otherwise specified  Neuromuscluar re-ed:   -Semi tandem gait 4x over 10 meters  -Tandem gait 2x over 10 meters; rate medium  Cone taps with decreasing levels of UE support and with no UE support- 4 rounds of multiple reps each round. Pt rates medium. Knocks over cones multiple times. Pt does grab on to bar occassionally to steady self.  2# ankle weights donned: Standing in place, pt alternates toe tap over orange hurdle x several reps each LE --pt then progresses to fully stepping forward/backward over  orange hurdle x multiple reps. Exhibits hip circumduction.   Standing on airex: WBOS EO - 60 sec NBOS EO 60 sec NBOS EC 3x30 sec - very challenging, requires up to min assist, intermittent UE support  Tandem stance at support surface 2x45 sec each LE. Challenging for pt.  Standing hip abduction with 2# weights donned 2x12 BLEs. Pt compensates with lateral lean. Cuing for technique.   B heel raises with 2# weights on each LE - 10x. Pt rates easy. Single leg heel raise with 2# weights - 6x on R and 10x on L. Pt with difficulty sustaining RLE in hamstring curl isometric while contralateral LE performs heel raise.   Ambulation for endurance with 2# weights donned 4x around clinic     Education provided throughout session via VC/TC and demonstration to facilitate movement at target joints and correct muscle activation for all testing and exercises performed.        PT Education - 11/04/21 1712     Education Details exercise technique, body mechanics    Person(s) Educated Patient    Methods Explanation;Demonstration;Tactile cues;Verbal cues    Comprehension Verbalized understanding;Verbal cues required;Returned demonstration;Need further instruction              PT Short Term Goals - 10/22/21 0759       PT SHORT TERM GOAL #1   Title Pt will be independent with HEP in order to improve strength and balance in order to decrease fall risk and improve function at home and work.    Baseline 10/21/21: HEP deferred to next visit    Time 6    Period Weeks    Status New    Target Date 12/02/21               PT Long Term Goals - 10/27/21 1440       PT LONG TERM GOAL #1   Title Pt will improve FOTO score to >71% to show improved functional mobility with ADLs    Baseline 10/21/21: 58%    Time 12    Period Weeks    Status New    Target Date 01/13/22      PT LONG TERM GOAL #2   Title Pt will improve BERG by at least 3 points in order to demonstrate clinically significant  improvement in balance.    Baseline 10/21/21: 44/56    Time 12    Period Weeks    Status New    Target Date 01/13/22      PT LONG TERM GOAL #3   Title Pt will decrease 5TSTS by at least 3 seconds in order to demonstrate clinically significant improvement in LE strength.    Baseline 10/21/21: 16.3 sec without UE support    Time 12    Period Weeks    Status New  Target Date 01/13/22      PT LONG TERM GOAL #4   Title Pt will improve 10MWT gait speed to >1.0 m/s with least restrictive AD to show clinically significant improvement and decrease risk of falls.    Baseline 10/21/21: 0.82 m/s with 2WW and CGA    Time 12    Period Weeks    Status New    Target Date 01/13/22      PT LONG TERM GOAL #5   Title Pt will increase 6MWT by at least 76m (169ft) in order to demonstrate clinically significant improvement in cardiopulmonary endurance and community ambulation    Baseline 10/21/21: deferred to next visit; 10/27/2021= 790 feet with front wheeled walker    Time 12    Period Weeks    Status New    Target Date 01/13/22                   Plan - 11/05/21 0857     Clinical Impression Statement Pt highly motivated to participate in session. Pt able to perform some higher level dynamic balance activities with minimal decreases in postural stability. However, pt still requires CGA. Pt does fatigue quickly, requiring frequent rest intervals. The pt will benefit from further skilled PT to improve strength, coordination, balance and gait to decrease fall risk and increase QOL.    Personal Factors and Comorbidities Age;Comorbidity 1;Sex;Time since onset of injury/illness/exacerbation;Fitness;Social Background    Comorbidities HLD    Examination-Activity Limitations Bathing;Bed Mobility;Caring for Others;Reach Overhead;Hygiene/Grooming;Self Feeding;Squat;Stairs;Transfers;Toileting;Sleep;Locomotion Level;Lift;Stand    Examination-Participation Restrictions Cleaning;Community  Activity;Shop;Laundry;Meal Prep;Volunteer;Driving;Interpersonal Relationship    Stability/Clinical Decision Making Stable/Uncomplicated    Rehab Potential Good    PT Frequency 2x / week    PT Duration 12 weeks    PT Treatment/Interventions ADLs/Self Care Home Management;Cryotherapy;Electrical Stimulation;Moist Heat;Ultrasound;Gait training;Stair training;DME Instruction;Functional mobility training;Therapeutic activities;Therapeutic exercise;Balance training;Neuromuscular re-education;Patient/family education;Manual techniques;Energy conservation;Passive range of motion;Joint Manipulations;Splinting;Taping;Vestibular;Visual/perceptual remediation/compensation;Wheelchair mobility training;Canalith Repostioning    PT Next Visit Plan Progress dynamic balance    PT Home Exercise Plan 10/27/2021=Access Code: [email or web address]    Consulted and Agree with Plan of Care Patient             Patient will benefit from skilled therapeutic intervention in order to improve the following deficits and impairments:  Abnormal gait, Decreased activity tolerance, Decreased endurance, Decreased knowledge of use of DME, Decreased strength, Impaired sensation, Hypomobility, Decreased range of motion, Impaired UE functional use, Improper body mechanics, Pain, Decreased balance, Decreased coordination, Decreased mobility, Decreased safety awareness, Difficulty walking, Impaired flexibility, Postural dysfunction  Visit Diagnosis: Unsteadiness on feet  Muscle weakness (generalized)  Other abnormalities of gait and mobility  Other lack of coordination     Problem List Patient Active Problem List   Diagnosis Date Noted   HTN (hypertension) 10/19/2021   Constipation 10/19/2021   Elevated cholesterol 10/15/2021   Acute renal injury ([place]) 10/15/2021   Acute ischemic left ACA stroke ([place]) 10/07/2021   Stroke (cerebrum) ([place]) 10/01/2021    Zollie Pee, PT 11/05/2021, 9:00 AM  [place] MAIN [facility] SERVICES [address] [place], [place], [address] Phone: [phone]   Fax:  [phone]  Name: TEKOA HAMOR MRN: [id number] Date of Birth: 12/30/1950

## 2021-11-10 ENCOUNTER — Other Ambulatory Visit: Payer: Self-pay

## 2021-11-10 ENCOUNTER — Ambulatory Visit: Payer: Medicare Other

## 2021-11-10 DIAGNOSIS — E785 Hyperlipidemia, unspecified: Secondary | ICD-10-CM | POA: Diagnosis not present

## 2021-11-10 DIAGNOSIS — D5911 Warm autoimmune hemolytic anemia: Secondary | ICD-10-CM | POA: Diagnosis not present

## 2021-11-10 DIAGNOSIS — R262 Difficulty in walking, not elsewhere classified: Secondary | ICD-10-CM

## 2021-11-10 DIAGNOSIS — I82441 Acute embolism and thrombosis of right tibial vein: Secondary | ICD-10-CM | POA: Diagnosis not present

## 2021-11-10 DIAGNOSIS — R1909 Other intra-abdominal and pelvic swelling, mass and lump: Secondary | ICD-10-CM | POA: Diagnosis not present

## 2021-11-10 DIAGNOSIS — D72829 Elevated white blood cell count, unspecified: Secondary | ICD-10-CM | POA: Diagnosis not present

## 2021-11-10 DIAGNOSIS — R739 Hyperglycemia, unspecified: Secondary | ICD-10-CM | POA: Diagnosis not present

## 2021-11-10 DIAGNOSIS — D649 Anemia, unspecified: Secondary | ICD-10-CM | POA: Diagnosis not present

## 2021-11-10 DIAGNOSIS — Z20822 Contact with and (suspected) exposure to covid-19: Secondary | ICD-10-CM | POA: Diagnosis not present

## 2021-11-10 DIAGNOSIS — Z79899 Other long term (current) drug therapy: Secondary | ICD-10-CM | POA: Diagnosis not present

## 2021-11-10 DIAGNOSIS — I82452 Acute embolism and thrombosis of left peroneal vein: Secondary | ICD-10-CM | POA: Diagnosis not present

## 2021-11-10 DIAGNOSIS — I10 Essential (primary) hypertension: Secondary | ICD-10-CM | POA: Diagnosis not present

## 2021-11-10 DIAGNOSIS — Z7982 Long term (current) use of aspirin: Secondary | ICD-10-CM | POA: Diagnosis not present

## 2021-11-10 DIAGNOSIS — Z7902 Long term (current) use of antithrombotics/antiplatelets: Secondary | ICD-10-CM | POA: Diagnosis not present

## 2021-11-10 DIAGNOSIS — Z8673 Personal history of transient ischemic attack (TIA), and cerebral infarction without residual deficits: Secondary | ICD-10-CM | POA: Diagnosis not present

## 2021-11-10 DIAGNOSIS — M6281 Muscle weakness (generalized): Secondary | ICD-10-CM

## 2021-11-10 DIAGNOSIS — R2681 Unsteadiness on feet: Secondary | ICD-10-CM

## 2021-11-10 DIAGNOSIS — R269 Unspecified abnormalities of gait and mobility: Secondary | ICD-10-CM

## 2021-11-10 DIAGNOSIS — R42 Dizziness and giddiness: Secondary | ICD-10-CM | POA: Diagnosis not present

## 2021-11-10 NOTE — Therapy (Signed)
[place] MAIN [facility] SERVICES [address] [place], [place], [address] Phone: [phone]   Fax:  [phone]  Physical Therapy Treatment  Patient Details  Name: Andrea Williamson MRN: [id number] Date of Birth: 20-Feb-1951 Referring Provider (PT): Reesa Chew, [place]   Encounter Date: 11/10/2021   PT End of Session - 11/10/21 1051     Visit Number 7    Number of Visits 25    Date for PT Re-Evaluation 01/13/22    Authorization Time Period Eval performed: 10/21/21    Progress Note Due on Visit 10    PT Start Time 1010    PT Stop Time 1040    PT Time Calculation (min) 30 min    Equipment Utilized During Treatment Gait belt    Activity Tolerance Patient tolerated treatment well    Behavior During Therapy [facility] for tasks assessed/performed             History reviewed. No pertinent past medical history.  Past Surgical History:  Procedure Laterality Date   ANKLE SURGERY Left 2008   IR CT HEAD LTD  10/05/2021   IR INTRA CRAN STENT  10/01/2021   IR PERCUTANEOUS ART THROMBECTOMY/INFUSION INTRACRANIAL INC DIAG ANGIO  10/01/2021   IR US GUIDE VASC ACCESS RIGHT  10/01/2021   RADIOLOGY WITH ANESTHESIA N/A 10/01/2021   Procedure: IR WITH ANESTHESIA;  Surgeon: Radiologist, Medication, MD;  Location: [facility];  Service: Radiology;  Laterality: N/A;    There were no vitals filed for this visit.   Subjective Assessment - 11/10/21 1102     Subjective Patient reports she has not felt well since two day before Christmas. She reports she has been experiencing some weakness, dizziness, and just generally not feeling well. "I feel like I am taking several steps backward."    Patient is accompained by: Family member    Pertinent History Pt is a 70 y/o female presenting with R LE strength and balance deficits. Pt is s/p L hemisphere CVA, admitted to the ED 10/01/21. Imaging included MRI scans on 10/02/21 yielding "Redemonstrated infarcts in the left ACA and ACA/PCA  watershed  territory, with one new area of restricted diffusion in the medial right parietal lobe, likely an additional acute infarct, status post  A2 stenting. A small amount of associated petechial hemorrhage is  noted within the medial left frontal lobe, without significant  hemorrhagic transformation." per Dr. Merilyn Baba, MD. Pt has PMH that includes HLD.    Limitations Walking;Standing    How long can you sit comfortably? "a long time"    How long can you stand comfortably? 30 minutes to an hour    How long can you walk comfortably? 7 minutes    Diagnostic tests CT (10/01/21) - 1. The right common femoral artery has normal caliber, adequate for   vascular access.   2. 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   3. Intracranial atherosclerotic disease with multifocal areas of   mild stenosis in the left ACA and MCA vascular trees. MRI HEAD WITHOUT CONTRAST (10/12/21) - "Redemonstrated infarcts in the left ACA and ACA/PCA watershed  territory, with one new area of restricted diffusion in the medial right parietal lobe, likely an additional acute infarct, status post  A2 stenting. A small amount of associated petechial hemorrhage is  noted within the medial left frontal lobe, without significant  hemorrhagic transformation."    Patient Stated Goals wants to be able to go back to bowling in january,  taking care of her dog and cat    Currently in Pain? No/denies            *Patient reported not feeling well - Having some recent dizziness (none today) and just generally not feeling well.   INTERVENTIONS:   BP: 145/117 mmHg;  137/85 (rechecked 2 min later in sitting) Left arm HR=98 bpm  Standing 124/ 82 mmHG (patient asymptomatic other than fatigued with standing)  HR=111 bpm   *Assisted patient looking up Neurology appointment and making sure patient knew she had appt and location/time. Reviewed new PCP visit scheduled for 11/23/2021.   Therapeutic Exercises:   Seated Hip March using  3lb BLE x 12 reps x 2 sets HR= 123 bpm after completing Seated knee ext using 3 lb BLE x 12 reps HR=125  bpm after completing and reported just general fatigue and not feeling well. Session terminated and advised patient to go to urgent care or ED if symptoms worsen before her MD appointment on Thurs. She and her husband verbalized understanding.  Discussed only performing seated LE strengthening exercises if experiencing any dizziness with standing and patient verbalized understanding.                          PT Education - 11/10/21 1104     Education Details Education in how BP and heart rate can affect her physically    Person(s) Educated Patient;Spouse    Methods Explanation;Verbal cues    Comprehension Verbalized understanding;Need further instruction;Verbal cues required              PT Short Term Goals - 10/22/21 0759       PT SHORT TERM GOAL #1   Title Pt will be independent with HEP in order to improve strength and balance in order to decrease fall risk and improve function at home and work.    Baseline 10/21/21: HEP deferred to next visit    Time 6    Period Weeks    Status New    Target Date 12/02/21               PT Long Term Goals - 10/27/21 1440       PT LONG TERM GOAL #1   Title Pt will improve FOTO score to >71% to show improved functional mobility with ADLs    Baseline 10/21/21: 58%    Time 12    Period Weeks    Status New    Target Date 01/13/22      PT LONG TERM GOAL #2   Title Pt will improve BERG by at least 3 points in order to demonstrate clinically significant improvement in balance.    Baseline 10/21/21: 44/56    Time 12    Period Weeks    Status New    Target Date 01/13/22      PT LONG TERM GOAL #3   Title Pt will decrease 5TSTS by at least 3 seconds in order to demonstrate clinically significant improvement in LE strength.    Baseline 10/21/21: 16.3 sec without UE support    Time 12    Period Weeks    Status  New    Target Date 01/13/22      PT LONG TERM GOAL #4   Title Pt will improve 10MWT gait speed to >1.0 m/s with least restrictive AD to show clinically significant improvement and decrease risk of falls.    Baseline 10/21/21: 0.82 m/s with 2WW and  CGA    Time 12    Period Weeks    Status New    Target Date 01/13/22      PT LONG TERM GOAL #5   Title Pt will increase 6MWT by at least 94m (111ft) in order to demonstrate clinically significant improvement in cardiopulmonary endurance and community ambulation    Baseline 10/21/21: deferred to next visit; 10/27/2021= 790 feet with front wheeled walker    Time 12    Period Weeks    Status New    Target Date 01/13/22                   Plan - 11/10/21 1051     Clinical Impression Statement Treatment very limited due to patient not feeling well today. She did exhibit decreased BP systolic with standing but not quite orthostatic and no lightheadiness/dizziness but did endorse mostly fatigue. Her Heart rate continued to increase with minimal LE seated exercises and eventually terminated session due to patient not feeling well. The pt will benefit from continued skilled PT interventions to improve LE strength/coordination, improve balance, decrease risk for future falls, and improve quality of life    Personal Factors and Comorbidities Age;Comorbidity 1;Sex;Time since onset of injury/illness/exacerbation;Fitness;Social Background    Comorbidities HLD    Examination-Activity Limitations Bathing;Bed Mobility;Caring for Others;Reach Overhead;Hygiene/Grooming;Self Feeding;Squat;Stairs;Transfers;Toileting;Sleep;Locomotion Level;Lift;Stand    Examination-Participation Restrictions Cleaning;Community Activity;Shop;Laundry;Meal Prep;Volunteer;Driving;Interpersonal Relationship    Stability/Clinical Decision Making Stable/Uncomplicated    Rehab Potential Good    PT Frequency 2x / week    PT Duration 12 weeks    PT Treatment/Interventions ADLs/Self  Care Home Management;Cryotherapy;Electrical Stimulation;Moist Heat;Ultrasound;Gait training;Stair training;DME Instruction;Functional mobility training;Therapeutic activities;Therapeutic exercise;Balance training;Neuromuscular re-education;Patient/family education;Manual techniques;Energy conservation;Passive range of motion;Joint Manipulations;Splinting;Taping;Vestibular;Visual/perceptual remediation/compensation;Wheelchair mobility training;Canalith Repostioning    PT Next Visit Plan Progress dynamic balance    PT Home Exercise Plan 10/27/2021=Access Code: [email or web address]    Consulted and Agree with Plan of Care Patient             Patient will benefit from skilled therapeutic intervention in order to improve the following deficits and impairments:  Abnormal gait, Decreased activity tolerance, Decreased endurance, Decreased knowledge of use of DME, Decreased strength, Impaired sensation, Hypomobility, Decreased range of motion, Impaired UE functional use, Improper body mechanics, Pain, Decreased balance, Decreased coordination, Decreased mobility, Decreased safety awareness, Difficulty walking, Impaired flexibility, Postural dysfunction  Visit Diagnosis: Abnormality of gait and mobility  Difficulty in walking, not elsewhere classified  Muscle weakness (generalized)  Unsteadiness on feet     Problem List Patient Active Problem List   Diagnosis Date Noted   HTN (hypertension) 10/19/2021   Constipation 10/19/2021   Elevated cholesterol 10/15/2021   Acute renal injury ([place]) 10/15/2021   Acute ischemic left ACA stroke ([place]) 10/07/2021   Stroke (cerebrum) ([place]) 10/01/2021    Lewis Moccasin, PT 11/10/2021, 11:08 AM  [place] MAIN [facility] SERVICES [address] [place], [place], [address] Phone: [phone]   Fax:  [phone]  Name: Andrea Williamson MRN: [id number] Date of Birth: [birth year]

## 2021-11-12 ENCOUNTER — Emergency Department: Payer: Medicare Other

## 2021-11-12 ENCOUNTER — Other Ambulatory Visit: Payer: Self-pay

## 2021-11-12 ENCOUNTER — Inpatient Hospital Stay
Admission: EM | Admit: 2021-11-12 | Discharge: 2021-11-13 | DRG: 809 | Disposition: A | Discharge status: Other Acute Inpatient Hospital | Payer: Medicare Other | Attending: Internal Medicine | Admitting: Internal Medicine

## 2021-11-12 DIAGNOSIS — R739 Hyperglycemia, unspecified: Secondary | ICD-10-CM | POA: Diagnosis present

## 2021-11-12 DIAGNOSIS — D589 Hereditary hemolytic anemia, unspecified: Secondary | ICD-10-CM

## 2021-11-12 DIAGNOSIS — I82441 Acute embolism and thrombosis of right tibial vein: Secondary | ICD-10-CM | POA: Diagnosis present

## 2021-11-12 DIAGNOSIS — R42 Dizziness and giddiness: Secondary | ICD-10-CM | POA: Diagnosis not present

## 2021-11-12 DIAGNOSIS — D5919 Other autoimmune hemolytic anemia: Secondary | ICD-10-CM | POA: Diagnosis not present

## 2021-11-12 DIAGNOSIS — I82452 Acute embolism and thrombosis of left peroneal vein: Secondary | ICD-10-CM | POA: Diagnosis present

## 2021-11-12 DIAGNOSIS — Z7902 Long term (current) use of antithrombotics/antiplatelets: Secondary | ICD-10-CM

## 2021-11-12 DIAGNOSIS — D5911 Warm autoimmune hemolytic anemia: Secondary | ICD-10-CM | POA: Diagnosis present

## 2021-11-12 DIAGNOSIS — E785 Hyperlipidemia, unspecified: Secondary | ICD-10-CM | POA: Diagnosis present

## 2021-11-12 DIAGNOSIS — I10 Essential (primary) hypertension: Secondary | ICD-10-CM | POA: Diagnosis present

## 2021-11-12 DIAGNOSIS — D599 Acquired hemolytic anemia, unspecified: Secondary | ICD-10-CM | POA: Diagnosis not present

## 2021-11-12 DIAGNOSIS — D72829 Elevated white blood cell count, unspecified: Secondary | ICD-10-CM | POA: Diagnosis present

## 2021-11-12 DIAGNOSIS — I82409 Acute embolism and thrombosis of unspecified deep veins of unspecified lower extremity: Secondary | ICD-10-CM | POA: Diagnosis present

## 2021-11-12 DIAGNOSIS — Z79899 Other long term (current) drug therapy: Secondary | ICD-10-CM

## 2021-11-12 DIAGNOSIS — R5383 Other fatigue: Secondary | ICD-10-CM | POA: Diagnosis not present

## 2021-11-12 DIAGNOSIS — D598 Other acquired hemolytic anemias: Secondary | ICD-10-CM | POA: Diagnosis not present

## 2021-11-12 DIAGNOSIS — N9489 Other specified conditions associated with female genital organs and menstrual cycle: Secondary | ICD-10-CM | POA: Diagnosis present

## 2021-11-12 DIAGNOSIS — R1900 Intra-abdominal and pelvic swelling, mass and lump, unspecified site: Secondary | ICD-10-CM | POA: Diagnosis not present

## 2021-11-12 DIAGNOSIS — Z7982 Long term (current) use of aspirin: Secondary | ICD-10-CM | POA: Diagnosis not present

## 2021-11-12 DIAGNOSIS — Z8673 Personal history of transient ischemic attack (TIA), and cerebral infarction without residual deficits: Secondary | ICD-10-CM | POA: Diagnosis not present

## 2021-11-12 DIAGNOSIS — R1909 Other intra-abdominal and pelvic swelling, mass and lump: Secondary | ICD-10-CM | POA: Diagnosis present

## 2021-11-12 DIAGNOSIS — D649 Anemia, unspecified: Secondary | ICD-10-CM | POA: Diagnosis present

## 2021-11-12 DIAGNOSIS — Z20822 Contact with and (suspected) exposure to covid-19: Secondary | ICD-10-CM | POA: Diagnosis present

## 2021-11-12 HISTORY — DX: Essential (primary) hypertension: I10

## 2021-11-12 HISTORY — DX: Cerebral infarction, unspecified: I63.9

## 2021-11-12 LAB — CBC
HCT: 13.4 % — CL (ref 36.0–46.0)
Hemoglobin: 4.5 g/dL — CL (ref 12.0–15.0)
MCH: 37.5 pg — ABNORMAL HIGH (ref 26.0–34.0)
MCHC: 33.6 g/dL (ref 30.0–36.0)
MCV: 111.7 fL — ABNORMAL HIGH (ref 80.0–100.0)
Platelets: 352 10*3/uL (ref 150–400)
RBC: 1.2 MIL/uL — ABNORMAL LOW (ref 3.87–5.11)
RDW: 30 % — ABNORMAL HIGH (ref 11.5–15.5)
WBC: 22 10*3/uL — ABNORMAL HIGH (ref 4.0–10.5)
nRBC: 20.1 % — ABNORMAL HIGH (ref 0.0–0.2)

## 2021-11-12 LAB — COMPREHENSIVE METABOLIC PANEL
ALT: 20 U/L (ref 0–44)
AST: 34 U/L (ref 15–41)
Albumin: 3.8 g/dL (ref 3.5–5.0)
Alkaline Phosphatase: 96 U/L (ref 38–126)
Anion gap: 14 (ref 5–15)
BUN: 38 mg/dL — ABNORMAL HIGH (ref 8–23)
CO2: 18 mmol/L — ABNORMAL LOW (ref 22–32)
Calcium: 9.6 mg/dL (ref 8.9–10.3)
Chloride: 103 mmol/L (ref 98–111)
Creatinine, Ser: 1.06 mg/dL — ABNORMAL HIGH (ref 0.44–1.00)
GFR, Estimated: 57 mL/min — ABNORMAL LOW (ref >60)
Glucose, Bld: 265 mg/dL — ABNORMAL HIGH (ref 70–99)
Potassium: 3.4 mmol/L — ABNORMAL LOW (ref 3.5–5.1)
Sodium: 135 mmol/L (ref 135–145)
Total Bilirubin: 6.1 mg/dL — ABNORMAL HIGH (ref 0.3–1.2)
Total Protein: 7.4 g/dL (ref 6.5–8.1)

## 2021-11-12 LAB — LIPASE, BLOOD: Lipase: 37 U/L (ref 11–51)

## 2021-11-12 LAB — TROPONIN I (HIGH SENSITIVITY): Troponin I (High Sensitivity): 16 ng/L (ref <18)

## 2021-11-12 LAB — RESP PANEL BY RT-PCR (FLU A&B, COVID) ARPGX2
Influenza A by PCR: NEGATIVE
Influenza B by PCR: NEGATIVE
SARS Coronavirus 2 by RT PCR: NEGATIVE

## 2021-11-12 IMAGING — CT CT HEAD W/O CM
4 series · 17 of 47 positions shown, 19 images · non-contrast
Comparison: CT brain [DATE]

CLINICAL DATA: Dizziness nausea vomiting

EXAM:
CT HEAD WITHOUT CONTRAST
TECHNIQUE: Contiguous axial images were obtained from the base of the skull
through the vertex without intravenous contrast.

[Series 2: head wo · axial · 0.39mm/px · z∈[-86,+29]mm · 7 of 31 slices shown, 9 images]
[im 4/31  brain]
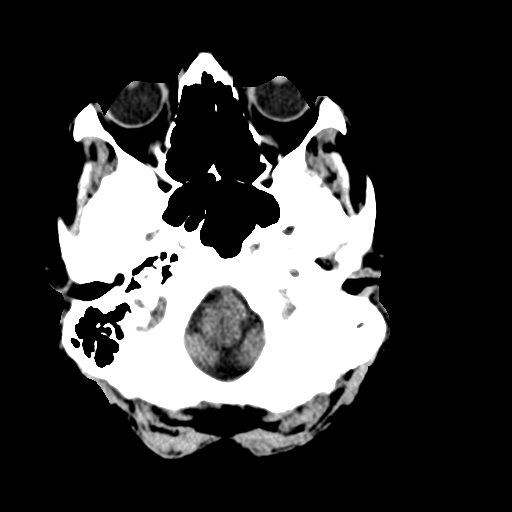
[im 4/31  bone]
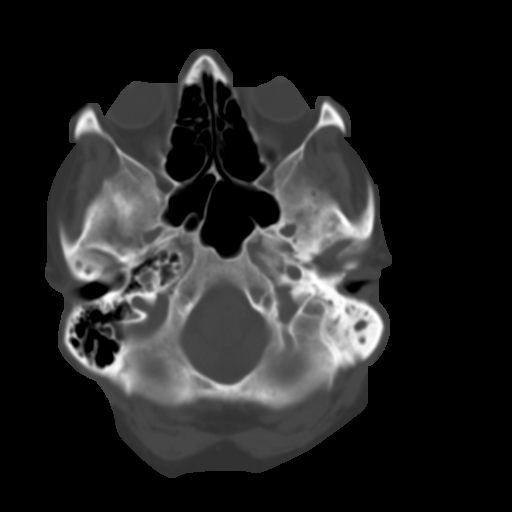
[im 8/31  brain]
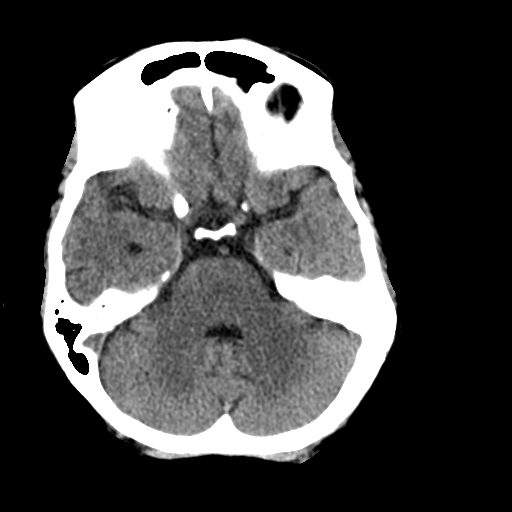
[im 12/31  brain]
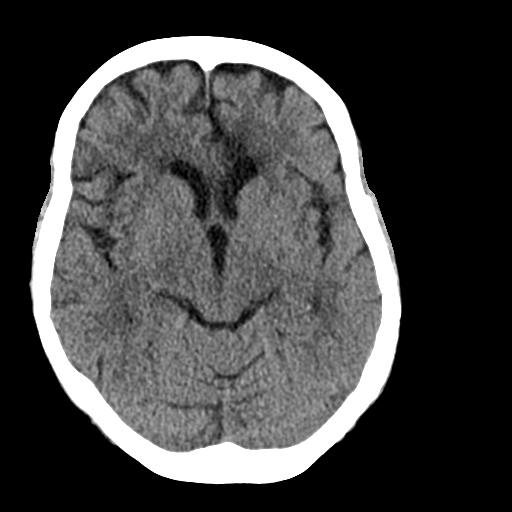
[im 16/31  brain]
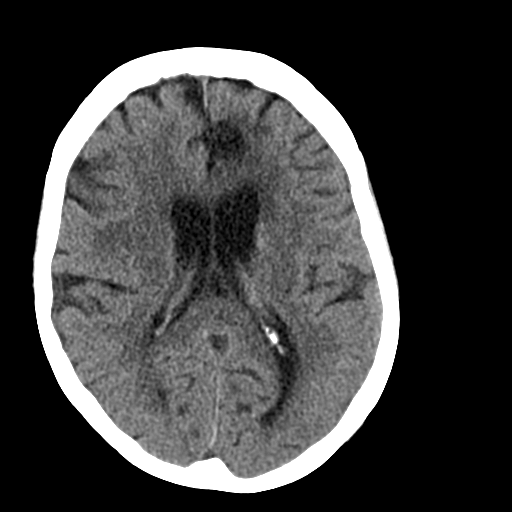
[im 19/31  brain]
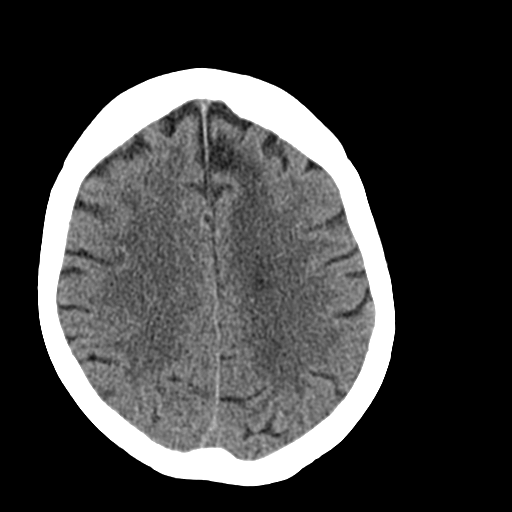
[im 19/31  bone]
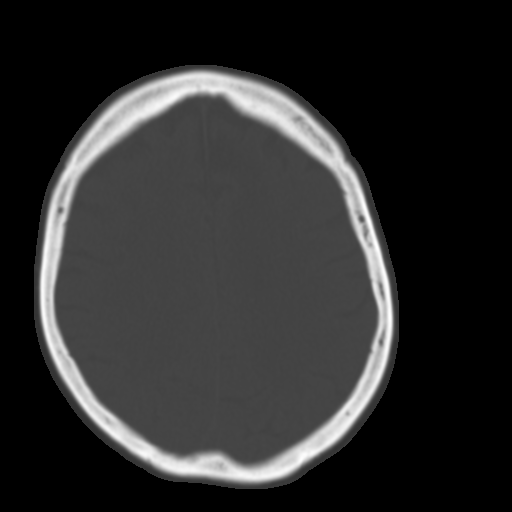
[im 23/31  brain]
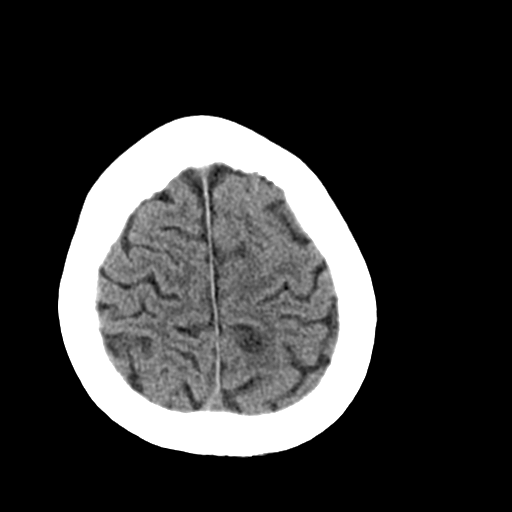
[im 27/31  brain]
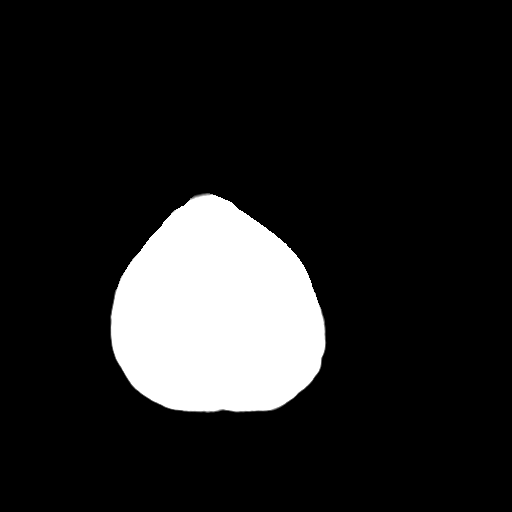

[Series 3: head bone · axial · 0.39mm/px · z∈[-87,-35]mm · 4 of 76 slices shown]
[im 8/76  bone]
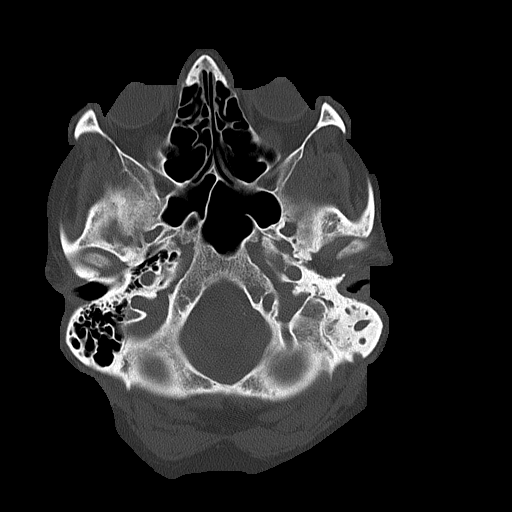
[im 16/76  bone]
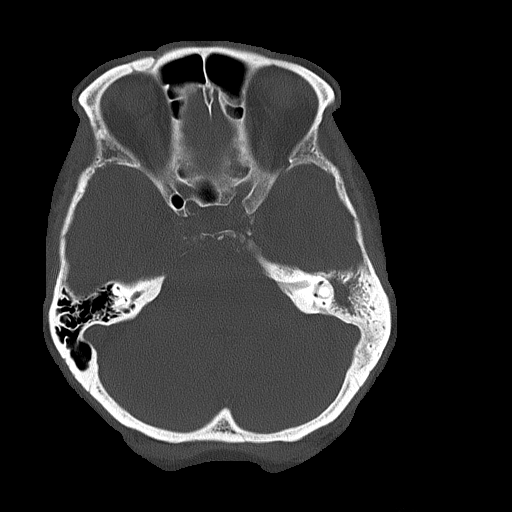
[im 23/76  bone]
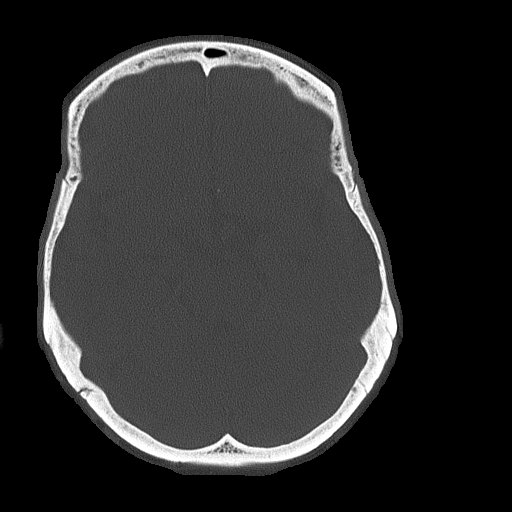
[im 34/76  bone]
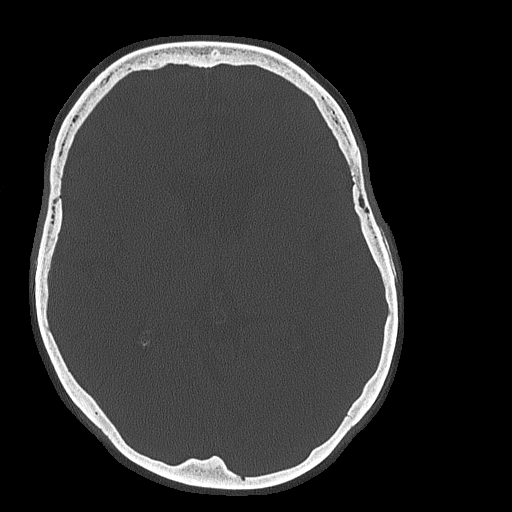

[Series 4: coronal soft tissue · coronal · 0.34mm/px · 3 of 65 slices shown]
[im 22/65  brain]
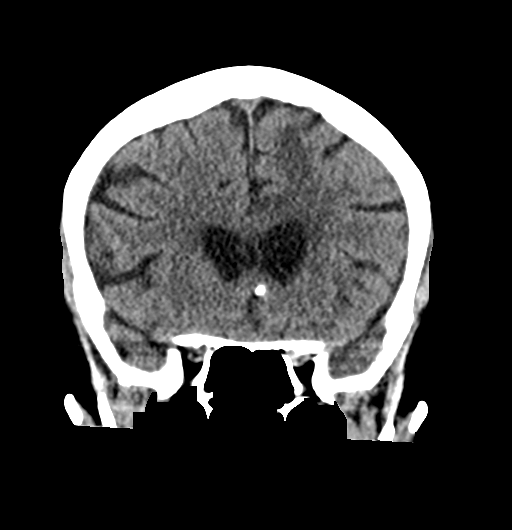
[im 29/65  brain]
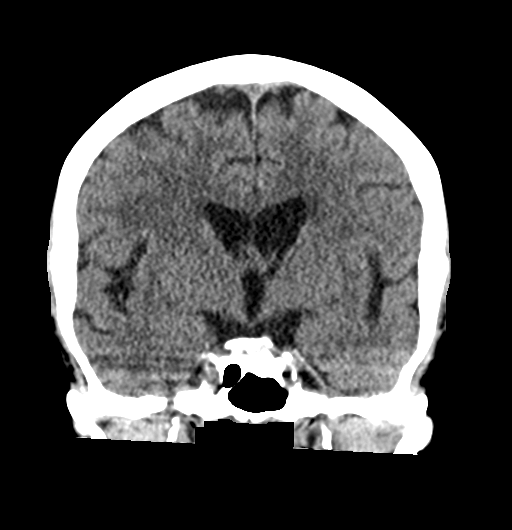
[im 36/65  brain]
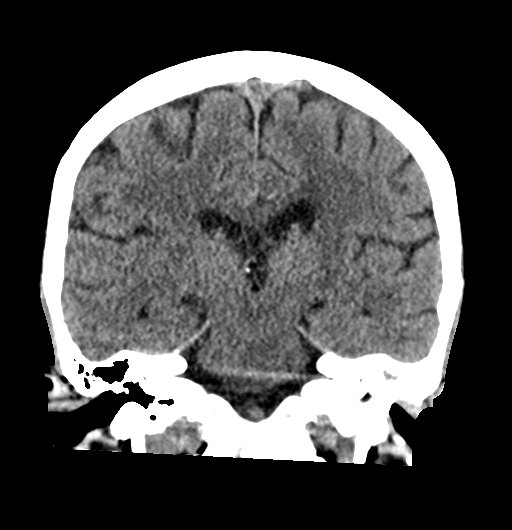

[Series 5: sagittal soft tissue · sagittal · 0.33mm/px · 3 of 57 slices shown]
[im 19/57  brain]
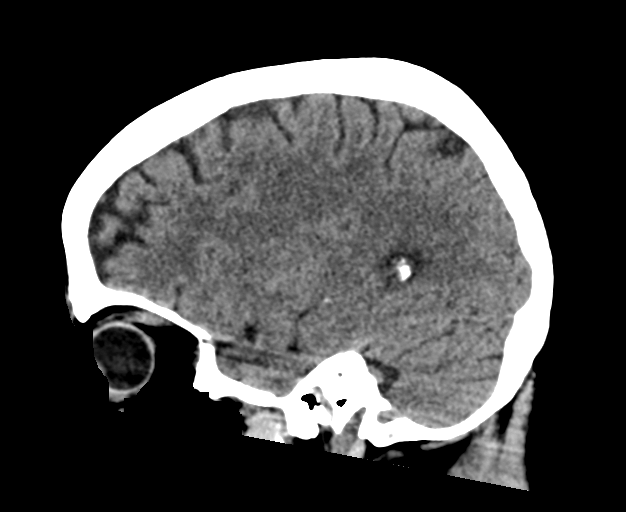
[im 29/57  brain]
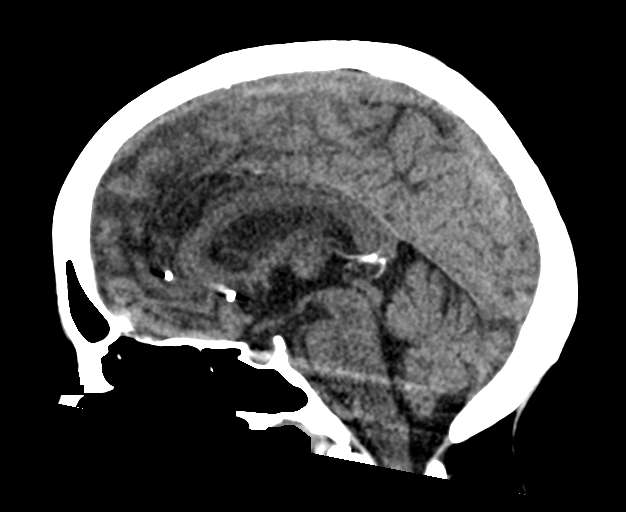
[im 38/57  brain]
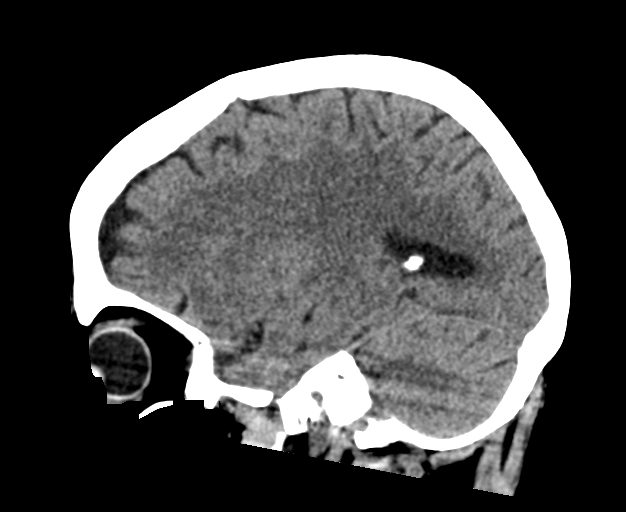

[17 of 47 positions shown; findings below may reference images not displayed]

FINDINGS: Brain: No acute territorial infarction, hemorrhage, or intracranial
mass. Scattered encephalomalacia within the left frontal lobe and
white matter corresponding to infarcts on previous MRI. Mild
atrophy. Chronic small vessel ischemic changes of the white matter.
Vague hypodensity within the left cerebellum corresponding to small
infarct on prior MRI.

Vascular: Left A2 stent as before.  Carotid vascular calcifications.

Skull: Normal. Negative for fracture or focal lesion.

Sinuses/Orbits: No acute finding.

Other: None
IMPRESSION: 1. No CT evidence for acute intracranial abnormality. Atrophy and
chronic small vessel ischemic changes of the white matter
2. Multiple small chronic appearing left ACA infarcts as noted on
previous imaging.

## 2021-11-12 MED ORDER — LACTATED RINGERS IV BOLUS
1000.0000 mL | Freq: Once | INTRAVENOUS | Status: AC
  Administered 2021-11-12: 23:00:00 1000 mL via INTRAVENOUS

## 2021-11-12 MED ORDER — LABETALOL HCL 5 MG/ML IV SOLN
10.0000 mg | Freq: Once | INTRAVENOUS | Status: AC
  Administered 2021-11-12: 10 mg via INTRAVENOUS
  Filled 2021-11-12: qty 4

## 2021-11-12 MED ORDER — SODIUM CHLORIDE 0.9 % IV SOLN: 10.0000 mL/h | Freq: Once | INTRAVENOUS | Status: DC

## 2021-11-12 MED ORDER — ONDANSETRON 4 MG PO TBDP: 4.0000 mg | ORAL_TABLET | Freq: Once | ORAL | Status: DC | PRN

## 2021-11-12 NOTE — ED Provider Notes (Signed)
[facility] Emergency Department Provider Note   ____________________________________________   Event Date/Time   First MD Initiated Contact with Patient 11/12/21 2228     (approximate)  I have reviewed the triage vital signs and the nursing notes.   HISTORY  Chief Complaint Nausea    HPI Andrea Williamson is a 70 y.o. female with past medical history of hypertension and stroke who presents to the ED complaining of dizziness and lightheadedness.  Patient was recently admitted to the hospital for acute ischemic stroke requiring thrombectomy, was subsequently started on Brilinta and discharged to inpatient rehab.  She states that she had been doing well with improved mobility in her right arm and right leg, up until about 3 days ago.  Since then, she has become increasingly weak with significant dizziness and lightheadedness.  She describes the sensation as feeling like she is going to pass out, denies any associated chest pain but does report mild shortness of breath.  She has not had any fever or cough, denies any nausea, vomiting, abdominal pain, or diarrhea.  She has not noticed any blood in her stool and denies any dark tarry stool.  Her family member did tell her earlier today that her skin appeared to be very yellow.  She denies any history of liver problems and does not drink alcohol.  She denies significant Tylenol use or other over-the-counter medication use.        History reviewed. No pertinent past medical history.  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   HTN (hypertension) 10/19/2021   Constipation 10/19/2021   Elevated cholesterol 10/15/2021   Acute renal injury ([place]) 10/15/2021   Acute ischemic left ACA stroke ([place]) 10/07/2021   Stroke (cerebrum) ([place]) 10/01/2021    Past Surgical History:  Procedure Laterality Date   ANKLE SURGERY Left 2008   IR CT HEAD LTD  10/05/2021   IR INTRA CRAN STENT   10/01/2021   IR PERCUTANEOUS ART THROMBECTOMY/INFUSION INTRACRANIAL INC DIAG ANGIO  10/01/2021   IR US GUIDE VASC ACCESS RIGHT  10/01/2021   RADIOLOGY WITH ANESTHESIA N/A 10/01/2021   Procedure: IR WITH ANESTHESIA;  Surgeon: Radiologist, Medication, MD;  Location: [facility];  Service: Radiology;  Laterality: N/A;    Prior to Admission medications   Medication Sig Start Date End Date Taking? Authorizing Provider  amLODipine (NORVASC) 5 MG tablet Take 1 tablet (5 mg total) by mouth daily. 10/16/21  Yes Love, Ivan Anchors, PA-C  aspirin 81 MG chewable tablet Chew 1 tablet (81 mg total) by mouth daily. 10/16/21  Yes Love, Ivan Anchors, PA-C  atorvastatin (LIPITOR) 80 MG tablet Take 1 tablet (80 mg total) by mouth daily. 10/16/21  Yes Love, Ivan Anchors, PA-C  magnesium gluconate (MAGONATE) 500 MG tablet Take 0.5 tablets (250 mg total) by mouth at bedtime. 10/16/21  Yes Love, Ivan Anchors, PA-C  nicotine (NICODERM CQ - DOSED IN MG/24 HOURS) 21 mg/24hr patch Place 1 patch (21 mg total) onto the skin daily. 10/16/21  Yes Love, Ivan Anchors, PA-C  senna-docusate (SENOKOT-S) 8.6-50 MG tablet Take 1 tablet by mouth daily at 6 (six) AM. 10/16/21  Yes Love, Ivan Anchors, PA-C  ticagrelor (BRILINTA) 90 MG TABS tablet Take 1 tablet (90 mg total) by mouth 2 (two) times daily. 10/16/21  Yes Love, Ivan Anchors, PA-C  acetaminophen (TYLENOL) 325 MG tablet Take 1-2 tablets (325-650 mg total) by mouth every 4 (four) hours as needed for mild pain.  10/16/21   Bary Leriche, PA-C    Allergies Patient has no known allergies.  History reviewed. No pertinent family history.  Social History Social History   Tobacco Use   Smoking status: Never   Smokeless tobacco: Never  Vaping Use   Vaping Use: Never used  Substance Use Topics   Alcohol use: Not Currently   Drug use: Not Currently    Review of Systems  Constitutional: No fever/chills.  Positive for generalized weakness. Eyes: No visual changes. ENT: No sore throat. Cardiovascular: Denies  chest pain.  Positive for dizziness and lightheadedness. Respiratory: Positive for shortness of breath. Gastrointestinal: No abdominal pain.  No nausea, no vomiting.  No diarrhea.  No constipation. Genitourinary: Negative for dysuria. Musculoskeletal: Negative for back pain. Skin: Negative for rash.  Positive for jaundice. Neurological: Negative for headaches, focal weakness or numbness.  ____________________________________________   PHYSICAL EXAM:  VITAL SIGNS: ED Triage Vitals  Enc Vitals Group     BP 11/12/21 2136 (!) 160/80     Pulse Rate 11/12/21 2133 96     Resp 11/12/21 2133 (!) 22     Temp 11/12/21 2133 97.6 F (36.4 C)     Temp Source 11/12/21 2133 Oral     SpO2 11/12/21 2133 100 %     Weight 11/12/21 2135 150 lb (68 kg)     Height 11/12/21 2135 5\' 3"  (1.6 m)     Head Circumference --      Peak Flow --      Pain Score 11/12/21 2135 0     Pain Loc --      Pain Edu? --      Excl. in [place]? --     Constitutional: Alert and oriented. Eyes: Conjunctivae are normal. Head: Atraumatic. Nose: No congestion/rhinnorhea. Mouth/Throat: Mucous membranes are moist. Neck: Normal ROM Cardiovascular: Normal rate, regular rhythm. Grossly normal heart sounds.  2+ radial pulses bilaterally. Respiratory: Normal respiratory effort.  No retractions. Lungs CTAB. Gastrointestinal: Soft and nontender. No distention.  Rectal exam with light brown guaiac-negative stool. Genitourinary: deferred Musculoskeletal: No lower extremity tenderness nor edema. Neurologic:  Normal speech and language. No gross focal neurologic deficits are appreciated. Skin:  Skin is warm, dry and intact. No rash noted.  Jaundice noted. Psychiatric: Mood and affect are normal. Speech and behavior are normal.  ____________________________________________   LABS (all labs ordered are listed, but only abnormal results are displayed)  Labs Reviewed  COMPREHENSIVE METABOLIC PANEL - Abnormal; Notable for the following  components:      Result Value   Potassium 3.4 (*)    CO2 18 (*)    Glucose, Bld 265 (*)    BUN 38 (*)    Creatinine, Ser 1.06 (*)    Total Bilirubin 6.1 (*)    GFR, Estimated 57 (*)    All other components within normal limits  CBC - Abnormal; Notable for the following components:   WBC 22.0 (*)    RBC 1.20 (*)    Hemoglobin 4.5 (*)    HCT 13.4 (*)    MCV 111.7 (*)    MCH 37.5 (*)    RDW 30.0 (*)    nRBC 20.1 (*)    All other components within normal limits  RESP PANEL BY RT-PCR (FLU A&B, COVID) ARPGX2  LIPASE, BLOOD  URINALYSIS, ROUTINE W REFLEX MICROSCOPIC  RETICULOCYTES  PROTIME-INR  HEPATIC FUNCTION PANEL  DIFFERENTIAL  LACTATE DEHYDROGENASE  HAPTOGLOBIN  FOLATE  PATHOLOGIST SMEAR REVIEW  VITAMIN B12  APTT  FIBRINOGEN  TYPE AND SCREEN  PREPARE RBC (CROSSMATCH)  DAT, POLYSPECIFIC [facility]HG ([facility] ONLY)  TYPE AND SCREEN  TROPONIN I (HIGH SENSITIVITY)   ____________________________________________  EKG  ED ECG REPORT I, Blake Divine, the attending physician, personally viewed and interpreted this ECG.   Date: 11/12/2021  EKG Time: 21:38  Rate: 92  Rhythm: normal sinus rhythm  Axis: Normal  Intervals:none  ST&T Change: None   PROCEDURES  Procedure(s) performed (including Critical Care):  .Critical Care Performed by: Blake Divine, MD Authorized by: Blake Divine, MD   Critical care provider statement:    Critical care time (minutes):  45   Critical care time was exclusive of:  Separately billable procedures and treating other patients and teaching time   Critical care was necessary to treat or prevent imminent or life-threatening deterioration of the following conditions:  Circulatory failure   Critical care was time spent personally by me on the following activities:  Development of treatment plan with patient or surrogate, discussions with consultants, evaluation of patient's response to treatment, examination of patient, ordering and review of  laboratory studies, ordering and review of radiographic studies, ordering and performing treatments and interventions, pulse oximetry, re-evaluation of patient's condition and review of old charts   I assumed direction of critical care for this patient from another provider in my specialty: no     Care discussed with: admitting provider     ____________________________________________   INITIAL IMPRESSION / [place] / ED COURSE      70 year old female with past medical history of hypertension and stroke who presents to the ED complaining of increasing dizziness and lightheadedness over the past 3 days with 24 hours of increased yellowing of her skin.  Labs completed from triage are remarkable for anemia with hemoglobin of 4.5, however WBC is elevated and platelet count is normal.  Patient also noted to have significant elevation in bilirubin but no elevation in AST, ALT, or alkaline phosphatase to suggest biliary obstruction.  Presentation is concerning for hemolytic anemia and findings discussed with Dr. Janese Banks of oncology.  We will add on additional labs including reticulocyte count, coags, LDH, haptoglobin, and peripheral smear.  We will transfuse 2 units PRBCs, patient remains hemodynamically stable at this time.  Low suspicion for TTP given platelet count is normal.  Case discussed with hospitalist for admission.      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    Blake Divine, MD 11/12/21 [phone]

## 2021-11-12 NOTE — ED Triage Notes (Addendum)
Pt to ED via POV.  Pt c/o dizziness, nausea and vomiting for appoximately 1 week.  Pt recently home from hospital discharge two weeks ago.  Pt denies pain, states she feels like the room is spinning. Pt A&Ox4, yellow in color

## 2021-11-13 ENCOUNTER — Inpatient Hospital Stay: Payer: Medicare Other

## 2021-11-13 ENCOUNTER — Inpatient Hospital Stay (HOSPITAL_COMMUNITY)
Admission: AD | Admit: 2021-11-13 | Discharge: 2021-11-20 | DRG: 809 | Disposition: A | Discharge status: Home / Self Care | Payer: Medicare Other | Source: Other Acute Inpatient Hospital | Attending: Family Medicine | Admitting: Family Medicine

## 2021-11-13 ENCOUNTER — Encounter: Payer: Self-pay | Admitting: Internal Medicine

## 2021-11-13 DIAGNOSIS — D5911 Warm autoimmune hemolytic anemia: Secondary | ICD-10-CM | POA: Diagnosis not present

## 2021-11-13 DIAGNOSIS — D598 Other acquired hemolytic anemias: Secondary | ICD-10-CM

## 2021-11-13 DIAGNOSIS — I82452 Acute embolism and thrombosis of left peroneal vein: Secondary | ICD-10-CM | POA: Diagnosis not present

## 2021-11-13 DIAGNOSIS — Z7982 Long term (current) use of aspirin: Secondary | ICD-10-CM

## 2021-11-13 DIAGNOSIS — R42 Dizziness and giddiness: Secondary | ICD-10-CM

## 2021-11-13 DIAGNOSIS — I10 Essential (primary) hypertension: Secondary | ICD-10-CM

## 2021-11-13 DIAGNOSIS — D649 Anemia, unspecified: Secondary | ICD-10-CM | POA: Diagnosis present

## 2021-11-13 DIAGNOSIS — Z7902 Long term (current) use of antithrombotics/antiplatelets: Secondary | ICD-10-CM

## 2021-11-13 DIAGNOSIS — M47817 Spondylosis without myelopathy or radiculopathy, lumbosacral region: Secondary | ICD-10-CM | POA: Diagnosis not present

## 2021-11-13 DIAGNOSIS — E785 Hyperlipidemia, unspecified: Secondary | ICD-10-CM | POA: Diagnosis not present

## 2021-11-13 DIAGNOSIS — N83291 Other ovarian cyst, right side: Secondary | ICD-10-CM | POA: Diagnosis present

## 2021-11-13 DIAGNOSIS — I82441 Acute embolism and thrombosis of right tibial vein: Secondary | ICD-10-CM | POA: Diagnosis present

## 2021-11-13 DIAGNOSIS — I82401 Acute embolism and thrombosis of unspecified deep veins of right lower extremity: Secondary | ICD-10-CM | POA: Diagnosis not present

## 2021-11-13 DIAGNOSIS — Z8673 Personal history of transient ischemic attack (TIA), and cerebral infarction without residual deficits: Secondary | ICD-10-CM

## 2021-11-13 DIAGNOSIS — R5381 Other malaise: Secondary | ICD-10-CM | POA: Diagnosis not present

## 2021-11-13 DIAGNOSIS — I63322 Cerebral infarction due to thrombosis of left anterior cerebral artery: Secondary | ICD-10-CM | POA: Diagnosis not present

## 2021-11-13 DIAGNOSIS — K429 Umbilical hernia without obstruction or gangrene: Secondary | ICD-10-CM | POA: Diagnosis not present

## 2021-11-13 DIAGNOSIS — I2510 Atherosclerotic heart disease of native coronary artery without angina pectoris: Secondary | ICD-10-CM | POA: Diagnosis not present

## 2021-11-13 DIAGNOSIS — R1900 Intra-abdominal and pelvic swelling, mass and lump, unspecified site: Secondary | ICD-10-CM | POA: Diagnosis not present

## 2021-11-13 DIAGNOSIS — R110 Nausea: Secondary | ICD-10-CM | POA: Diagnosis not present

## 2021-11-13 DIAGNOSIS — N9489 Other specified conditions associated with female genital organs and menstrual cycle: Secondary | ICD-10-CM | POA: Diagnosis not present

## 2021-11-13 DIAGNOSIS — G4700 Insomnia, unspecified: Secondary | ICD-10-CM | POA: Diagnosis not present

## 2021-11-13 DIAGNOSIS — D5919 Other autoimmune hemolytic anemia: Secondary | ICD-10-CM

## 2021-11-13 DIAGNOSIS — R531 Weakness: Secondary | ICD-10-CM | POA: Diagnosis present

## 2021-11-13 DIAGNOSIS — Z20822 Contact with and (suspected) exposure to covid-19: Secondary | ICD-10-CM | POA: Diagnosis not present

## 2021-11-13 DIAGNOSIS — F419 Anxiety disorder, unspecified: Secondary | ICD-10-CM | POA: Diagnosis present

## 2021-11-13 DIAGNOSIS — M47816 Spondylosis without myelopathy or radiculopathy, lumbar region: Secondary | ICD-10-CM | POA: Diagnosis not present

## 2021-11-13 DIAGNOSIS — D599 Acquired hemolytic anemia, unspecified: Secondary | ICD-10-CM

## 2021-11-13 DIAGNOSIS — J9859 Other diseases of mediastinum, not elsewhere classified: Secondary | ICD-10-CM | POA: Diagnosis not present

## 2021-11-13 DIAGNOSIS — R5383 Other fatigue: Secondary | ICD-10-CM

## 2021-11-13 DIAGNOSIS — D589 Hereditary hemolytic anemia, unspecified: Secondary | ICD-10-CM | POA: Diagnosis present

## 2021-11-13 DIAGNOSIS — I82403 Acute embolism and thrombosis of unspecified deep veins of lower extremity, bilateral: Secondary | ICD-10-CM

## 2021-11-13 DIAGNOSIS — I82409 Acute embolism and thrombosis of unspecified deep veins of unspecified lower extremity: Secondary | ICD-10-CM | POA: Diagnosis present

## 2021-11-13 DIAGNOSIS — I82442 Acute embolism and thrombosis of left tibial vein: Secondary | ICD-10-CM | POA: Diagnosis not present

## 2021-11-13 DIAGNOSIS — Z79899 Other long term (current) drug therapy: Secondary | ICD-10-CM | POA: Diagnosis not present

## 2021-11-13 DIAGNOSIS — R1909 Other intra-abdominal and pelvic swelling, mass and lump: Secondary | ICD-10-CM | POA: Diagnosis not present

## 2021-11-13 DIAGNOSIS — I639 Cerebral infarction, unspecified: Secondary | ICD-10-CM | POA: Diagnosis present

## 2021-11-13 DIAGNOSIS — N281 Cyst of kidney, acquired: Secondary | ICD-10-CM | POA: Diagnosis not present

## 2021-11-13 DIAGNOSIS — I82402 Acute embolism and thrombosis of unspecified deep veins of left lower extremity: Secondary | ICD-10-CM | POA: Diagnosis not present

## 2021-11-13 DIAGNOSIS — I253 Aneurysm of heart: Secondary | ICD-10-CM | POA: Diagnosis not present

## 2021-11-13 LAB — CBC WITH DIFFERENTIAL/PLATELET
Abs Immature Granulocytes: 1.66 10*3/uL — ABNORMAL HIGH (ref 0.00–0.07)
Basophils Absolute: 0.1 10*3/uL (ref 0.0–0.1)
Basophils Relative: 0 %
Eosinophils Absolute: 0.1 10*3/uL (ref 0.0–0.5)
Eosinophils Relative: 0 %
HCT: 15.1 % — ABNORMAL LOW (ref 36.0–46.0)
Hemoglobin: 5 g/dL — ABNORMAL LOW (ref 12.0–15.0)
Immature Granulocytes: 7 %
Lymphocytes Relative: 16 %
Lymphs Abs: 4.1 10*3/uL — ABNORMAL HIGH (ref 0.7–4.0)
MCH: 36.8 pg — ABNORMAL HIGH (ref 26.0–34.0)
MCHC: 33.1 g/dL (ref 30.0–36.0)
MCV: 111 fL — ABNORMAL HIGH (ref 80.0–100.0)
Monocytes Absolute: 1.4 10*3/uL — ABNORMAL HIGH (ref 0.1–1.0)
Monocytes Relative: 6 %
Neutro Abs: 18.1 10*3/uL — ABNORMAL HIGH (ref 1.7–7.7)
Neutrophils Relative %: 71 %
Platelets: 348 10*3/uL (ref 150–400)
RBC: 1.36 MIL/uL — ABNORMAL LOW (ref 3.87–5.11)
RDW: 29.2 % — ABNORMAL HIGH (ref 11.5–15.5)
Smear Review: NORMAL
WBC: 21.4 10*3/uL — ABNORMAL HIGH (ref 4.0–10.5)
nRBC: 18.8 % — ABNORMAL HIGH (ref 0.0–0.2)

## 2021-11-13 LAB — URINALYSIS, ROUTINE W REFLEX MICROSCOPIC
Bilirubin Urine: NEGATIVE
Glucose, UA: NEGATIVE mg/dL
Ketones, ur: NEGATIVE mg/dL
Nitrite: NEGATIVE
Protein, ur: 100 mg/dL — AB
Specific Gravity, Urine: 1.013 (ref 1.005–1.030)
pH: 6 (ref 5.0–8.0)

## 2021-11-13 LAB — BILIRUBIN, DIRECT: Bilirubin, Direct: 0.5 mg/dL — ABNORMAL HIGH (ref 0.0–0.2)

## 2021-11-13 LAB — HEMOGLOBIN A1C
Hgb A1c MFr Bld: 4.5 % — ABNORMAL LOW (ref 4.8–5.6)
Mean Plasma Glucose: 82.45 mg/dL

## 2021-11-13 LAB — FIBRINOGEN: Fibrinogen: 510 mg/dL — ABNORMAL HIGH (ref 210–475)

## 2021-11-13 LAB — HEPATIC FUNCTION PANEL
ALT: 16 U/L (ref 0–44)
AST: 28 U/L (ref 15–41)
Albumin: 2.9 g/dL — ABNORMAL LOW (ref 3.5–5.0)
Alkaline Phosphatase: 69 U/L (ref 38–126)
Bilirubin, Direct: 0.4 mg/dL — ABNORMAL HIGH (ref 0.0–0.2)
Indirect Bilirubin: 4.3 mg/dL — ABNORMAL HIGH (ref 0.3–0.9)
Total Bilirubin: 4.7 mg/dL — ABNORMAL HIGH (ref 0.3–1.2)
Total Protein: 5.3 g/dL — ABNORMAL LOW (ref 6.5–8.1)

## 2021-11-13 LAB — DAT, POLYSPECIFIC AHG (ARMC ONLY)
DAT, IgG: POSITIVE
DAT, complement: NEGATIVE
Polyspecific AHG test: POSITIVE

## 2021-11-13 LAB — RETICULOCYTES
Immature Retic Fract: 30.3 % — ABNORMAL HIGH (ref 2.3–15.9)
RBC.: 2.47 MIL/uL — ABNORMAL LOW (ref 3.87–5.11)
Retic Count, Absolute: 561 10*3/uL — ABNORMAL HIGH (ref 19.0–186.0)
Retic Ct Pct: 21.2 % — ABNORMAL HIGH (ref 0.4–3.1)

## 2021-11-13 LAB — HIV ANTIBODY (ROUTINE TESTING W REFLEX): HIV Screen 4th Generation wRfx: NONREACTIVE

## 2021-11-13 LAB — VITAMIN B12: Vitamin B-12: 389 pg/mL (ref 180–914)

## 2021-11-13 LAB — HEPATITIS C ANTIBODY: HCV Ab: NONREACTIVE

## 2021-11-13 LAB — APTT: aPTT: 27 seconds (ref 24–36)

## 2021-11-13 LAB — LACTATE DEHYDROGENASE: LDH: 734 U/L — ABNORMAL HIGH (ref 98–192)

## 2021-11-13 LAB — HEPATITIS B SURFACE ANTIBODY,QUALITATIVE: Hep B S Ab: NONREACTIVE

## 2021-11-13 LAB — PROTIME-INR
INR: 1.4 — ABNORMAL HIGH (ref 0.8–1.2)
Prothrombin Time: 16.7 seconds — ABNORMAL HIGH (ref 11.4–15.2)

## 2021-11-13 LAB — HEPATITIS B SURFACE ANTIGEN: Hepatitis B Surface Ag: NONREACTIVE

## 2021-11-13 LAB — HEPATITIS B CORE ANTIBODY, TOTAL: Hep B Core Total Ab: NONREACTIVE

## 2021-11-13 LAB — FOLATE: Folate: 10.6 ng/mL (ref >5.9)

## 2021-11-13 LAB — PATHOLOGIST SMEAR REVIEW

## 2021-11-13 IMAGING — US US EXTREM LOW VENOUS
1 series · 14 of 24 positions shown · non-contrast
Comparison: None.

CLINICAL DATA: Hemolytic anemia

EXAM:
BILATERAL LOWER EXTREMITY VENOUS DOPPLER ULTRASOUND
TECHNIQUE: Gray-scale sonography with compression, as well as color and duplex
ultrasound, were performed to evaluate the deep venous system(s)
from the level of the common femoral vein through the popliteal and
proximal calf veins.

[Series 1: us venous img lower bilat (dvt) · portal-venous · 14 of 74 slices shown]
[im 1/74]
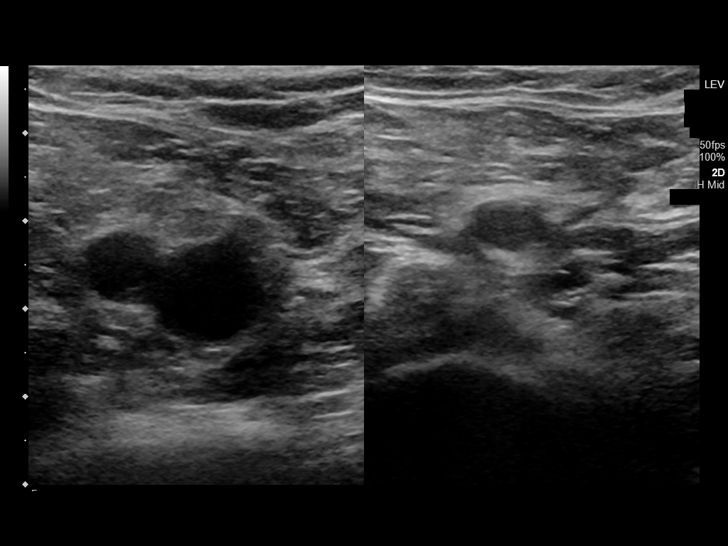
[im 7/74]
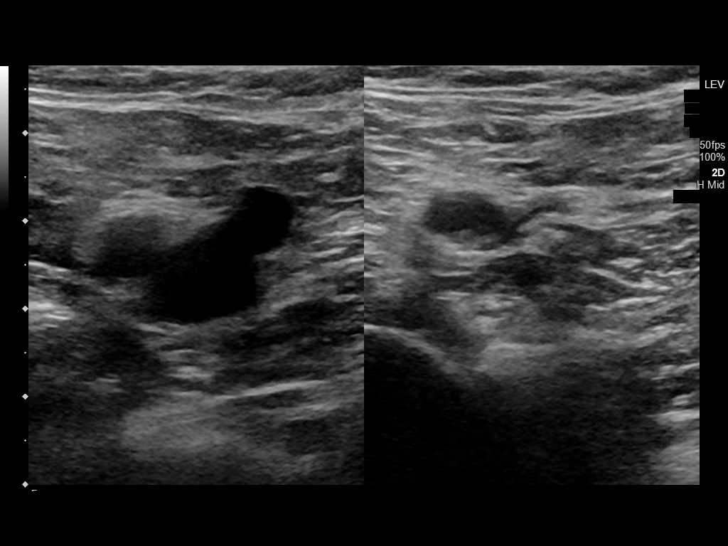
[im 13/74]
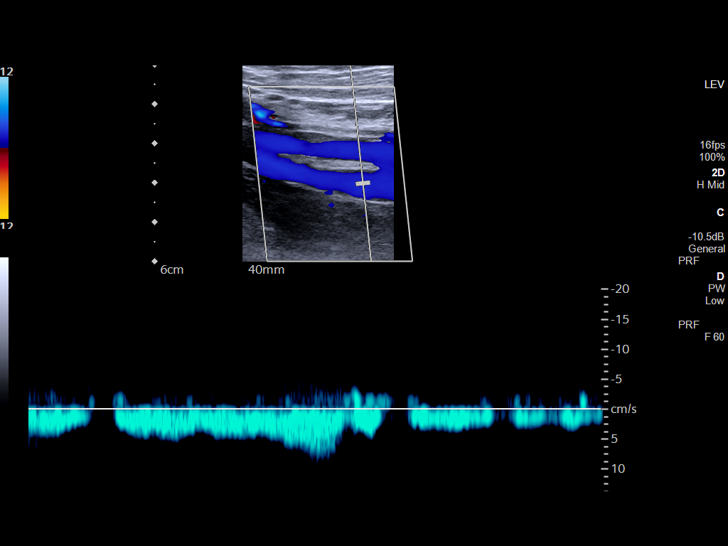
[im 20/74]
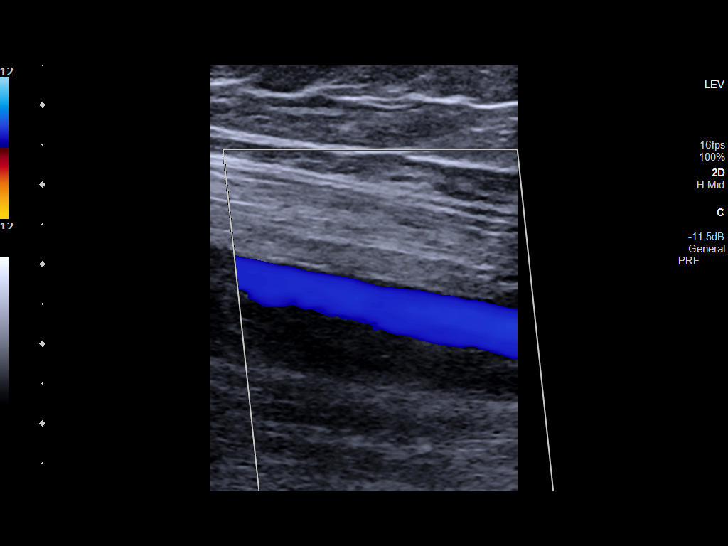
[im 23/74]
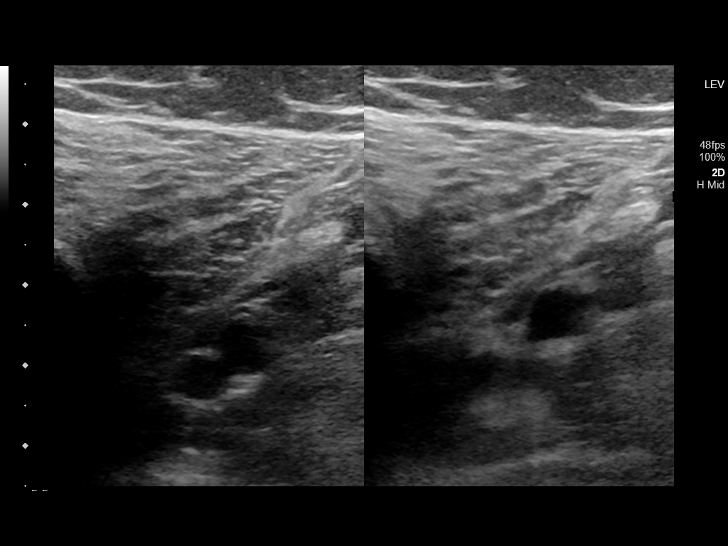
[im 29/74]
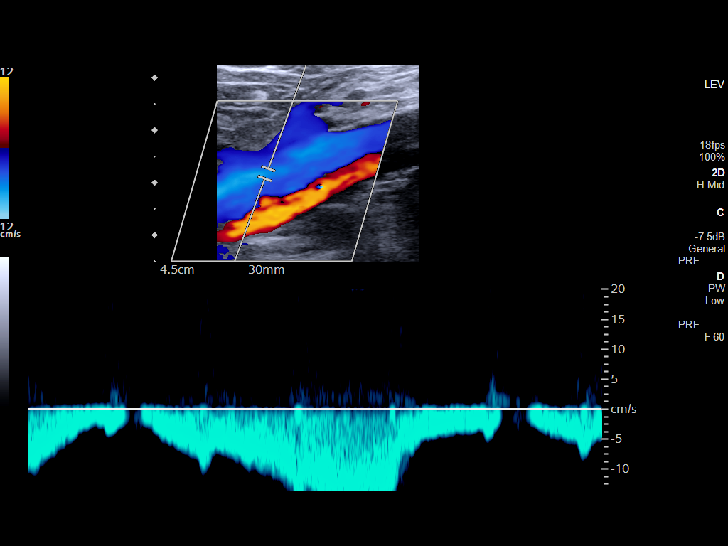
[im 35/74]
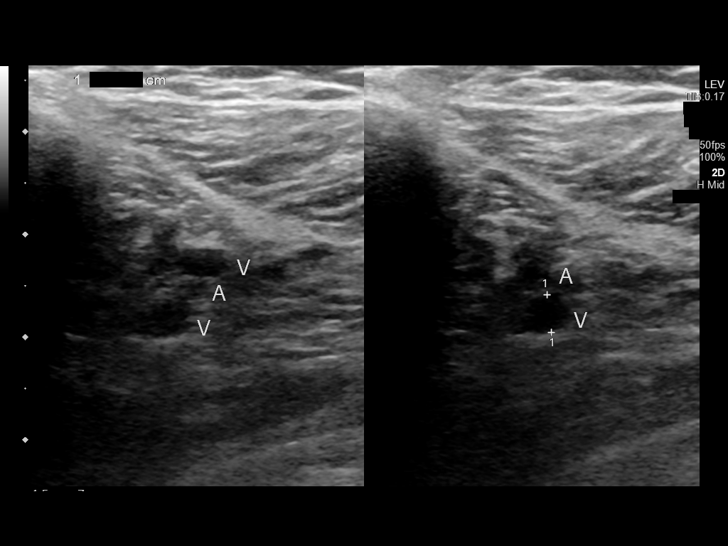
[im 39/74]
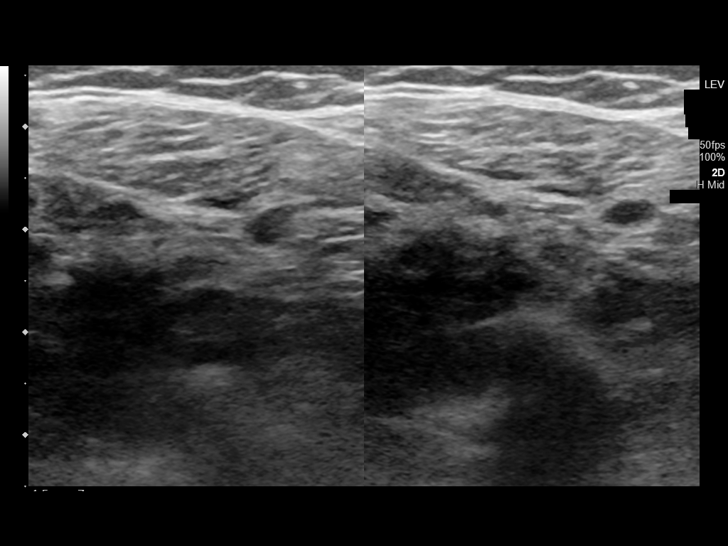
[im 45/74]
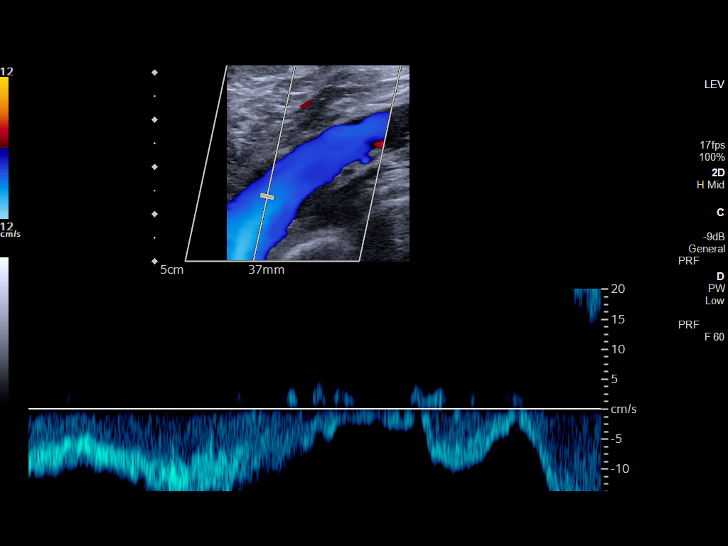
[im 51/74]
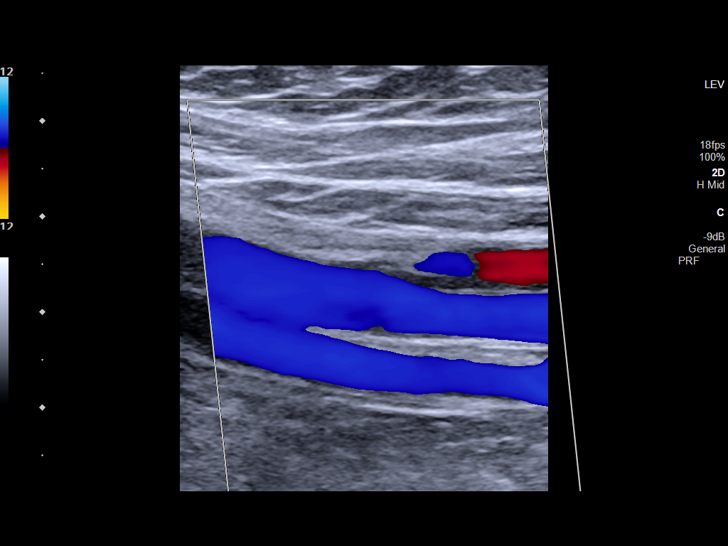
[im 58/74]
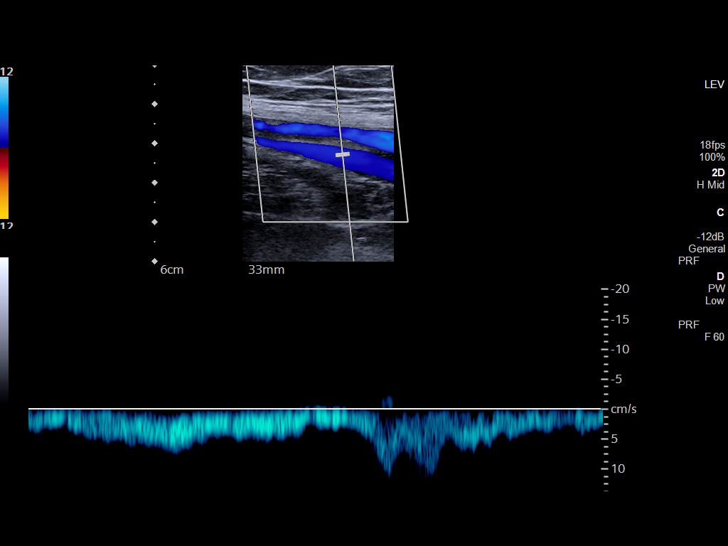
[im 61/74]
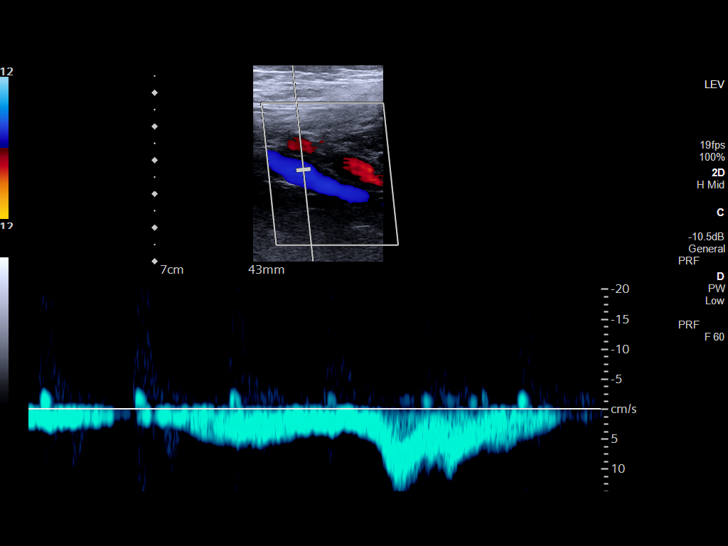
[im 67/74]
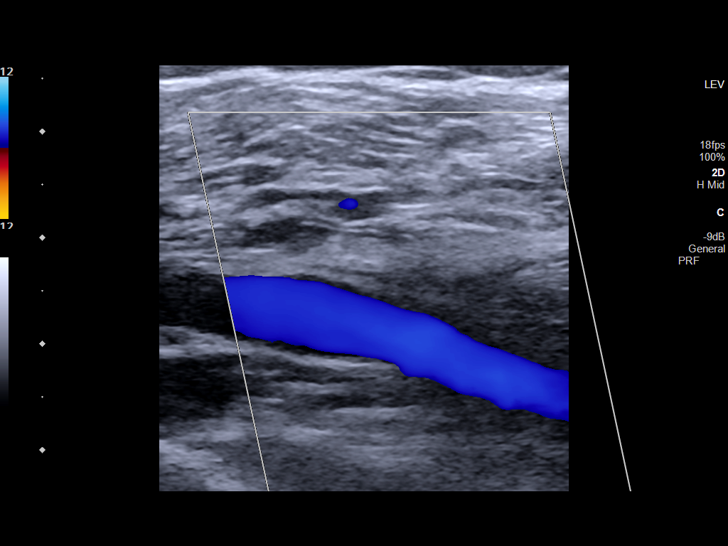
[im 74/74]
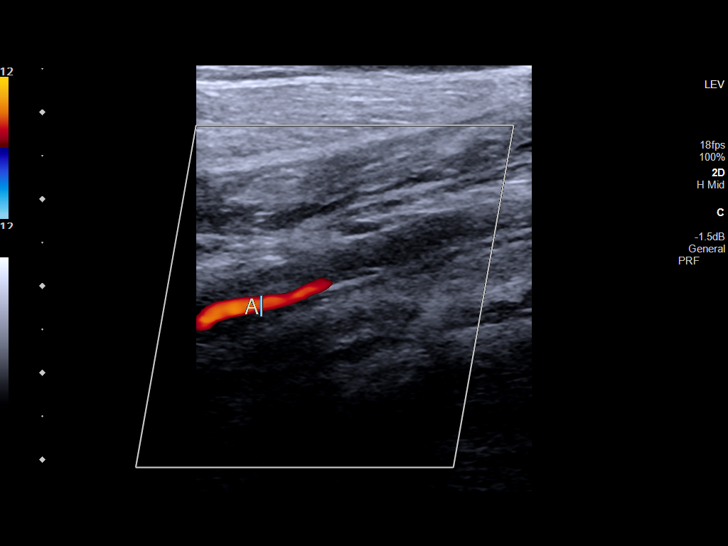

[14 of 24 positions shown; findings below may reference images not displayed]

FINDINGS: VENOUS

Normal compressibility, phasicity, and augmentation of the common
femoral, superficial femoral, and popliteal veins bilaterally.

However, there is occlusive thrombus in the right posterior tibial
vein, and occlusive thrombus in the left peroneal vein.

OTHER

None.

Limitations: none
IMPRESSION: 1. Today's exam is positive for bilateral below knee deep vein
thrombosis (BKDVT), with occlusive thrombus in the right posterior
tibial vein and in the left peroneal vein.

## 2021-11-13 IMAGING — CT CT CHEST-ABD-PELV W/ CM
2 of 5 series · 14 of 36 positions shown, 16 images · IV contrast (omnipaque)
Comparison: None.

CLINICAL DATA: Occult malignancy.  Nausea.

EXAM:
CT CHEST, ABDOMEN, AND PELVIS WITH CONTRAST
TECHNIQUE: Multidetector CT imaging of the chest, abdomen and pelvis was
performed following the standard protocol during bolus
administration of intravenous contrast.
CONTRAST:  100mL OMNIPAQUE IOHEXOL 300 MG/ML  SOLN

[Series 2: cap with · axial · 0.76mm/px · z∈[-899,-384]mm · 11 of 125 slices shown, 13 images]
[im 11/125  mediastinal]
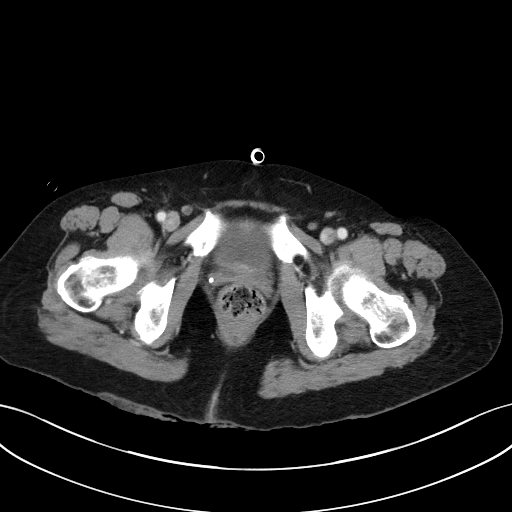
[im 11/125  bone]
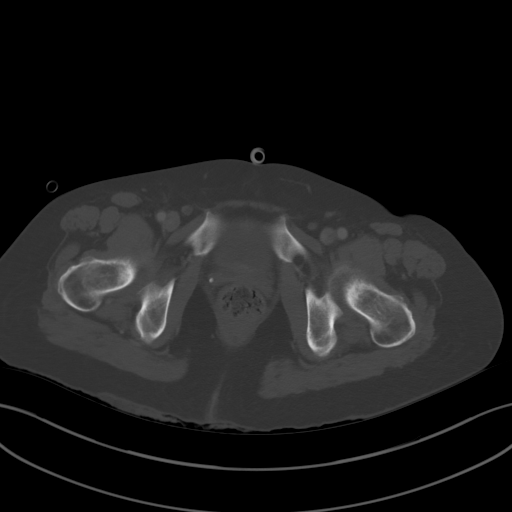
[im 21/125  mediastinal]
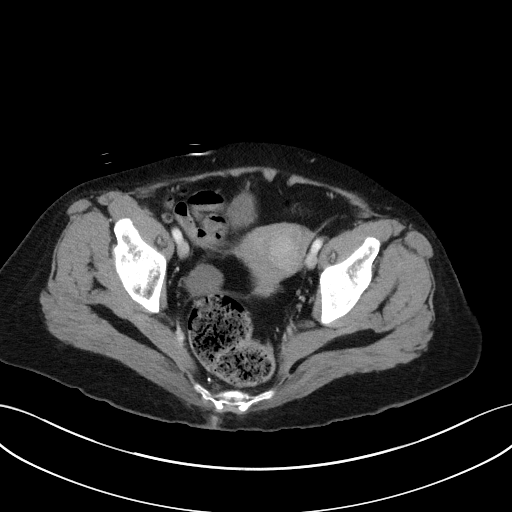
[im 32/125  mediastinal]
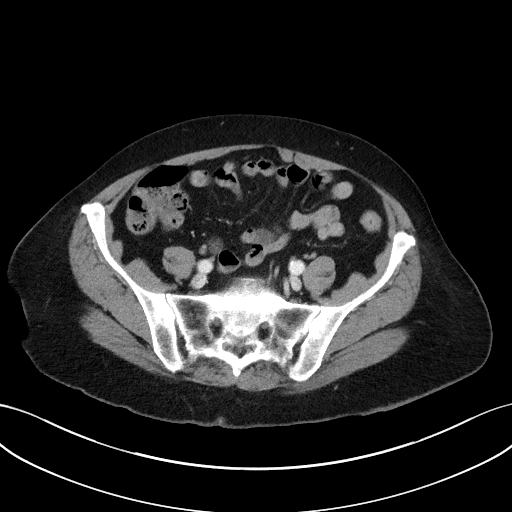
[im 42/125  mediastinal]
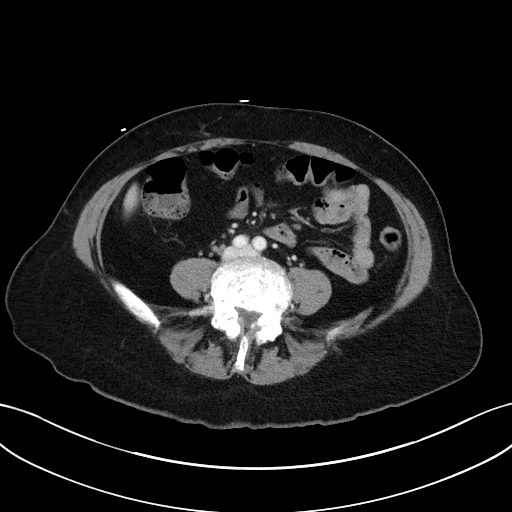
[im 52/125  mediastinal]
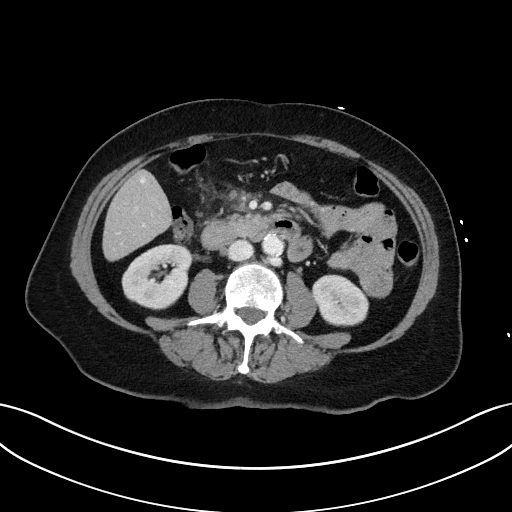
[im 63/125  mediastinal]
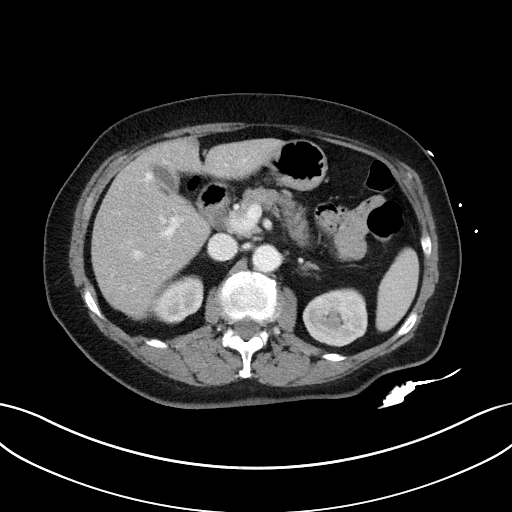
[im 73/125  mediastinal]
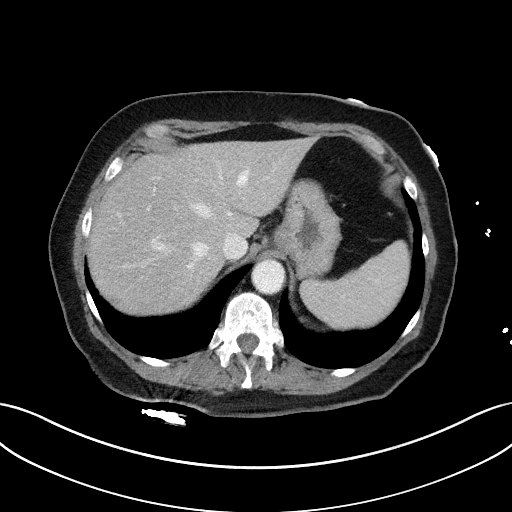
[im 83/125  mediastinal]
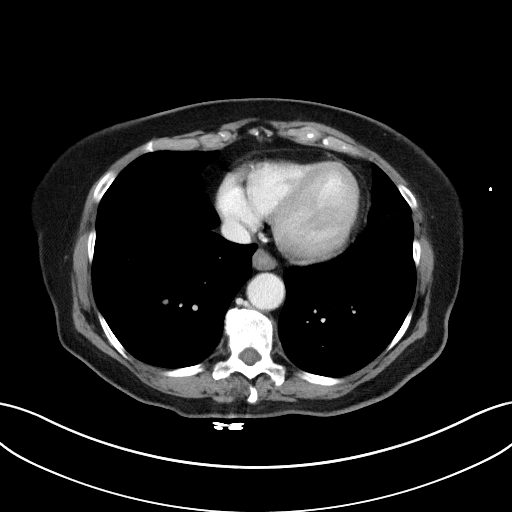
[im 94/125  mediastinal]
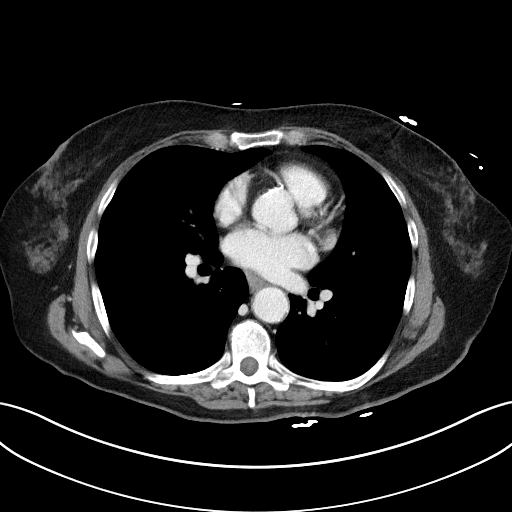
[im 94/125  bone]
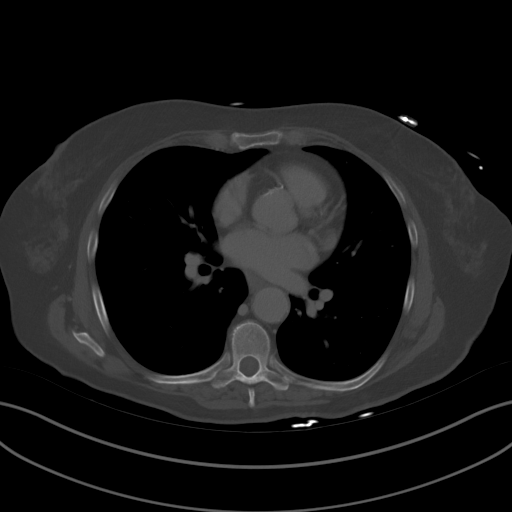
[im 104/125  mediastinal]
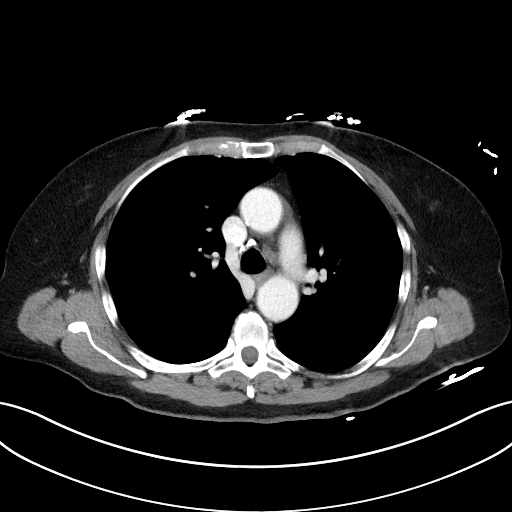
[im 114/125  mediastinal]
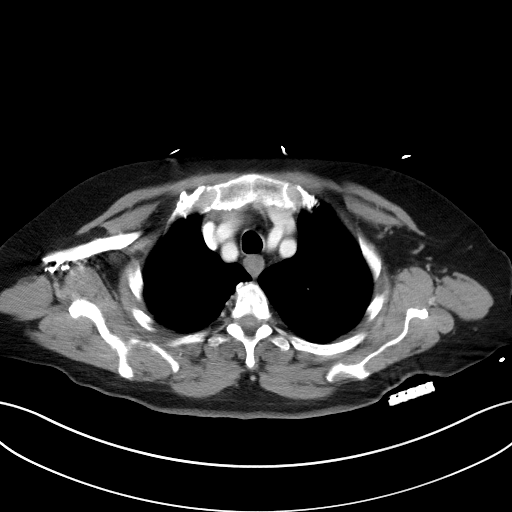

[Series 6: coronals · coronal · 0.77mm/px · 3 of 133 slices shown]
[im 27/133  mediastinal]
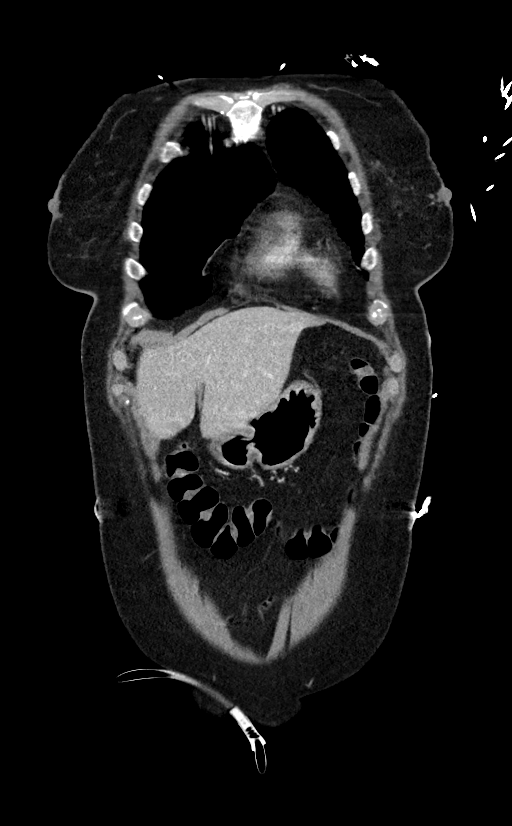
[im 53/133  mediastinal]
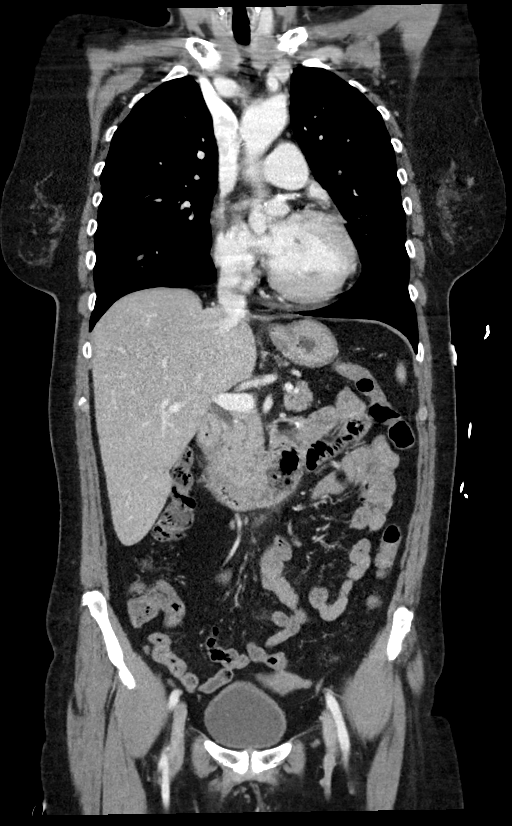
[im 80/133  mediastinal]
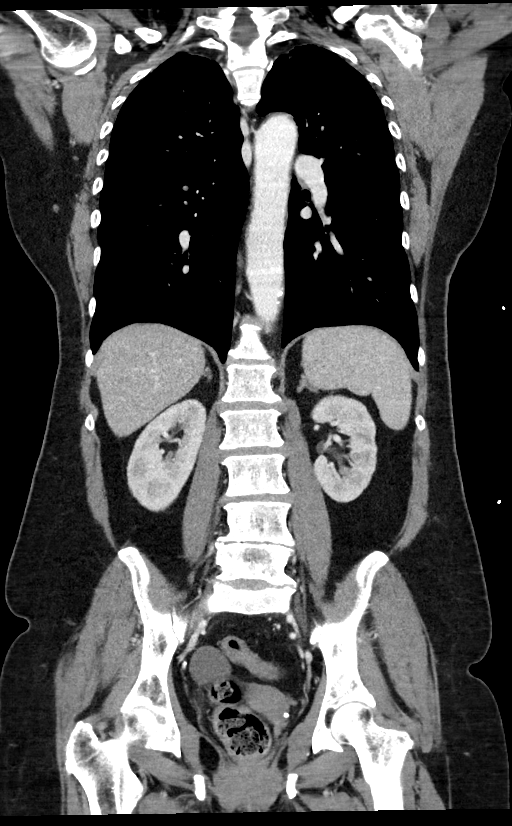

[14 of 36 positions shown; findings below may reference images not displayed]

FINDINGS: CT CHEST FINDINGS

Cardiovascular: Heart size is normal. Coronary artery calcifications
are present. The appearance of the pulmonary arteries is
unremarkable accounting for the contrast bolus timing. There is
atherosclerotic calcification of the thoracic aorta, not associated
with aneurysm.

Mediastinum/Nodes: The visualized portion of the thyroid gland has a
normal appearance. There is mild thickening of the LOWER esophageal
wall, not associated with discrete mass. No significant mediastinal,
hilar, or axillary adenopathy.

Lungs/Pleura: Minimal LEFT apical pleuroparenchymal changes. No
pulmonary nodules. No consolidations or pleural effusions.

Musculoskeletal: Mild midthoracic degenerative changes.

CT ABDOMEN PELVIS FINDINGS

Hepatobiliary: Mildly prominent RIGHT hepatic lobe, variation of
normal. No focal hepatic lesions. Normal parenchymal density.
Gallbladder is present.

Pancreas: Unremarkable. No pancreatic ductal dilatation or
surrounding inflammatory changes.

Spleen: Normal in size without focal abnormality.

Adrenals/Urinary Tract: Adrenal glands are normal in appearance.

A 1 centimeter benign cyst is identified in the UPPER pole of the
RIGHT kidney. LEFT kidney is unremarkable. Ureters are unremarkable.
The bladder and visualized portion of the urethra are normal.

Stomach/Bowel: Mild thickening of the LOWER esophageal wall. Stomach
is otherwise unremarkable. Small bowel loops are normal in
appearance. Moderate stool burden identified within the rectosigmoid
colon. Large bowel is otherwise unremarkable. The appendix is well
seen and normal in appearance.

Vascular/Lymphatic: There is mild, nonspecific stranding within the
mesentery in the RIGHT UPPER QUADRANT. Small lymph nodes are
identified within this region, largest measuring 7 millimeters in
short axis, image 76 of series 2. No retroperitoneal adenopathy.
There is atherosclerotic calcification of the abdominal aorta, not
associated with aneurysm. There is normal vascular opacification of
the celiac axis, superior mesenteric artery, and inferior mesenteric
artery. Normal appearance of the portal venous system and inferior
vena cava.

Reproductive: The uterus is present. A central lobulated enhancing
mass likely represents fibroid, measuring 3.0 centimeters on image
105 of series 2. The RIGHT adnexal region is notable for a solid and
cystic mass measuring 4.9 x 3.4 centimeters on image 102 of series
2. LEFT adnexal region is unremarkable. No ascites.

Other: Anterior abdominal wall is notable for fat containing
paraumbilical hernia.

Musculoskeletal: No acute or significant osseous findings.
IMPRESSION: 1. No acute abnormality of the chest.
2. Solid and cystic mass within the RIGHT adnexa, likely
representing ovarian lesion measuring 4.9 centimeters. Recommend
further characterization with pelvic ultrasound.
3. Nonspecific mesenteric stranding in the RIGHT UPPER QUADRANT,
associated subcentimeter lymph nodes. Consider follow-up in 3-6
months to assess for stability.
4.  Aortic atherosclerosis.  ([GK]-[GK])
5. RIGHT renal cyst.
6. Uterine fibroid.
7. Mild thickening of the LOWER esophagus, raising question of
esophagitis.

## 2021-11-13 IMAGING — US US PELVIS COMPLETE WITH TRANSVAGINAL
1 series · 12 of 25 positions shown · non-contrast
Comparison: CT done earlier today
COMPARISON: CT done earlier today

Addendum:
CLINICAL DATA: Right adnexal mass seen in the CT done earlier today

EXAM:
TRANSABDOMINAL AND TRANSVAGINAL ULTRASOUND OF PELVIS
DOPPLER ULTRASOUND OF OVARIES
TECHNIQUE: Both transabdominal and transvaginal ultrasound examinations of the
pelvis were performed. Transabdominal technique was performed for
global imaging of the pelvis including uterus, ovaries, adnexal
regions, and pelvic cul-de-sac.
It was necessary to proceed with endovaginal exam following the
transabdominal exam to visualize the ovaries. Color and duplex
Doppler ultrasound was utilized to evaluate blood flow to the
ovaries.

[Series 1: us pelvic complete with transvaginal · 57 acquisitions, 12 frames shown]
[im 3/57]
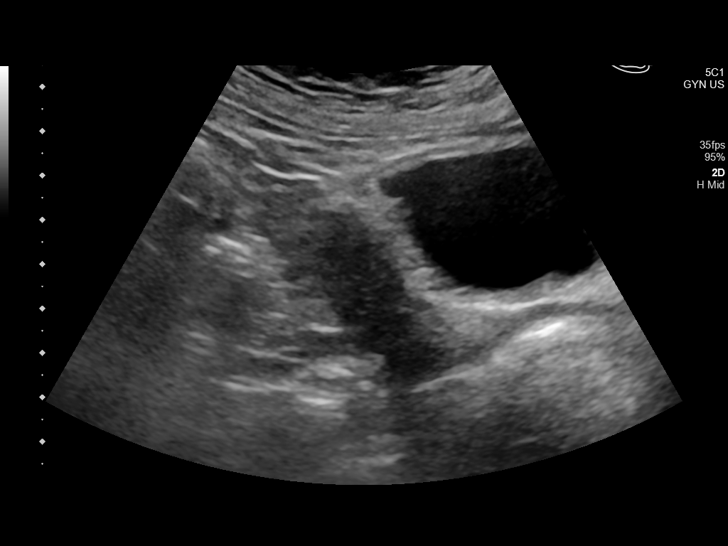
[im 8/57]
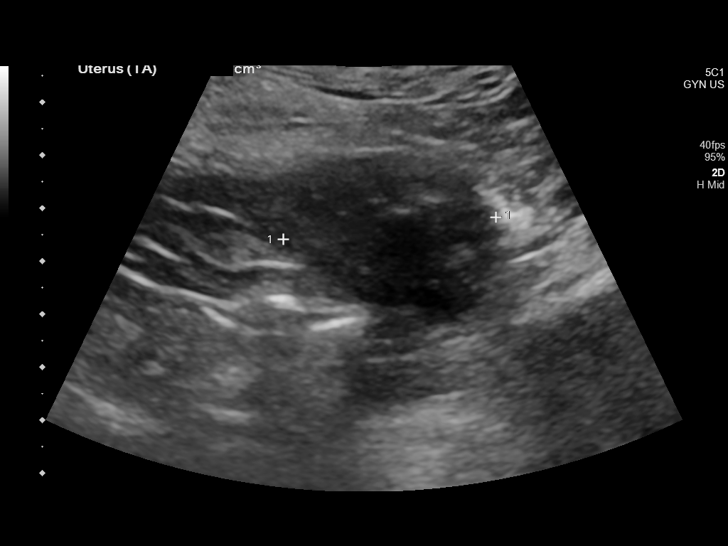
[im 12/57]
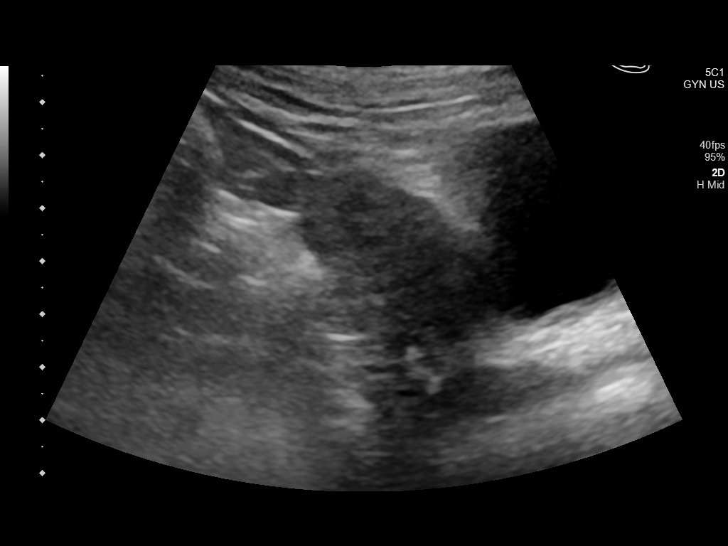
[im 17/57]
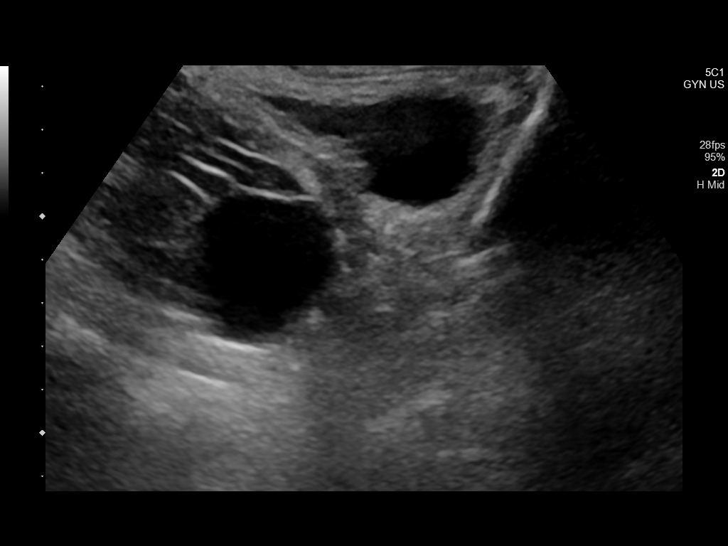
[im 22/57]
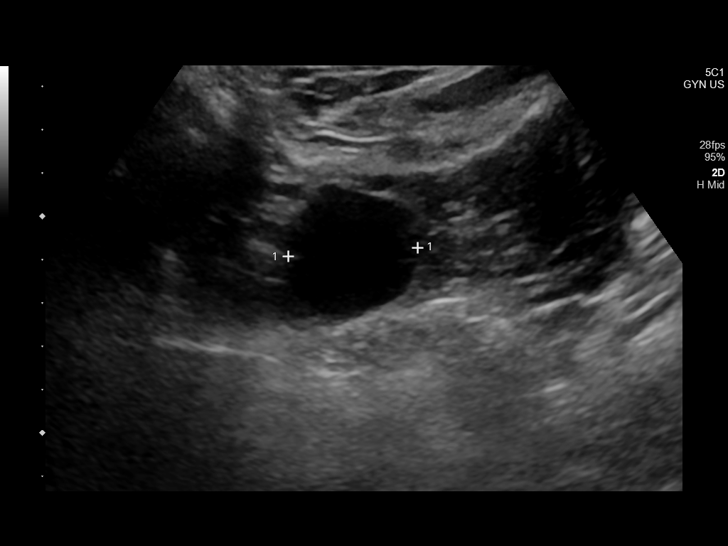
[im 26/57]
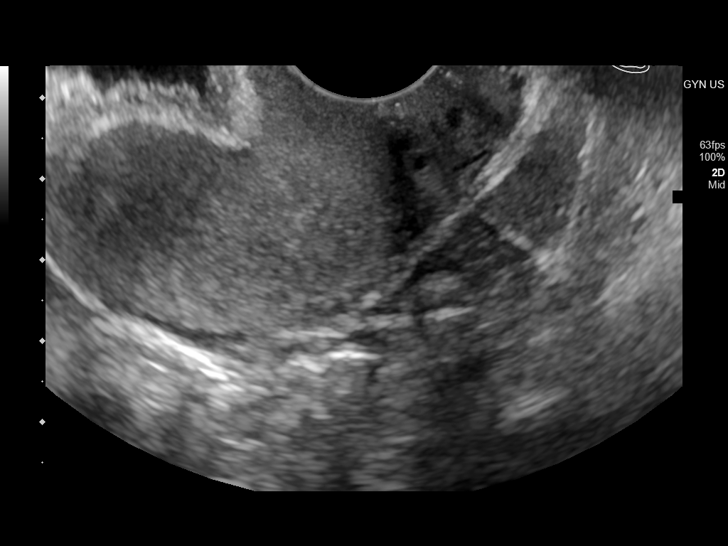
[im 31/57]
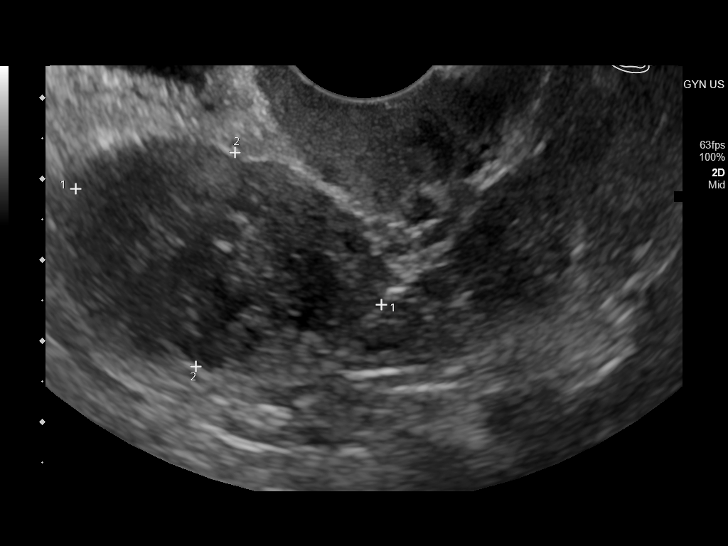
[im 36/57]
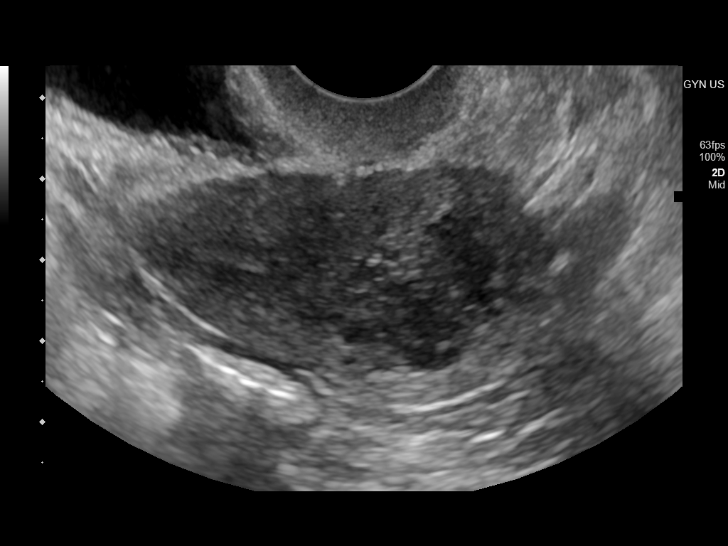
[im 40/57]
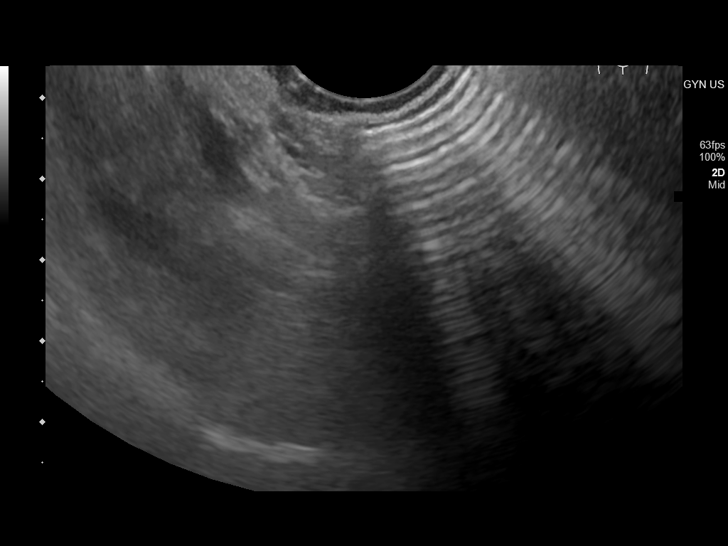
[im 45/57]
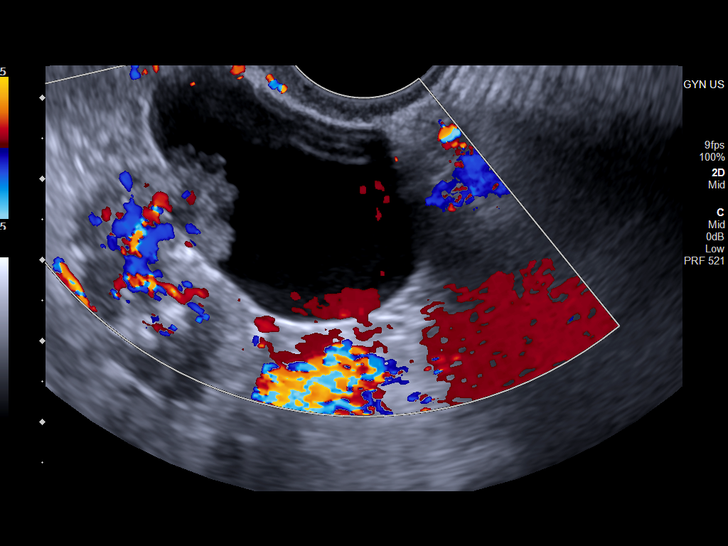
[im 50/57]
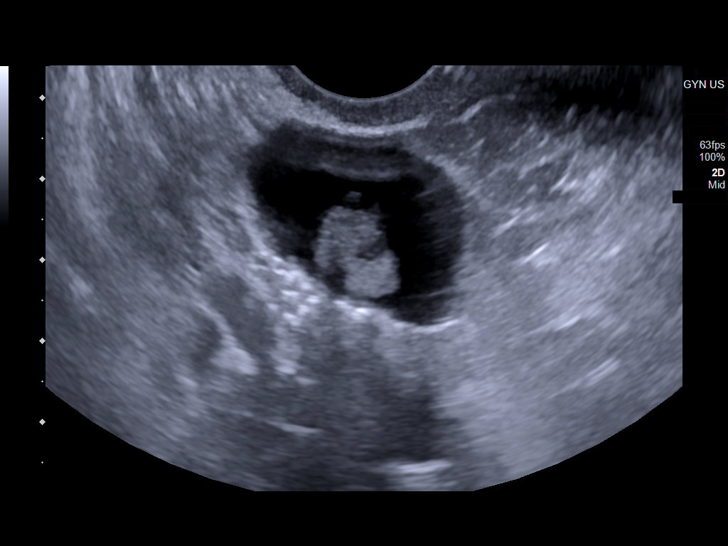
[im 54/57]
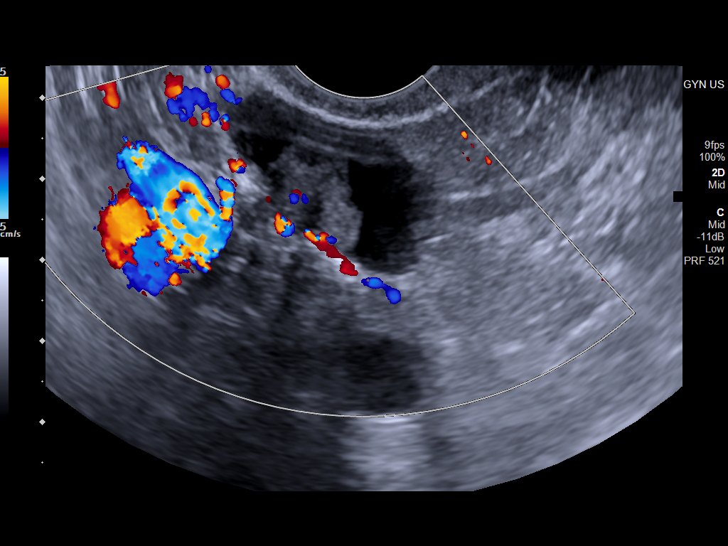

[12 of 25 positions shown; findings below may reference images not displayed]

FINDINGS: Uterus

Measurements: 5.9 x 2.4 x 5 cm = volume: 37.2 mL. There is 4 x 2.7 x
2.7 cm fibroid in the left side of body.

Endometrium

Thickness: 2.9 mm.  No focal abnormality visualized.

Right ovary

Measurements: 3.9 x 2.9 x 3.4 cm = volume: 20.15 mL. Is a complex
right adnexal lesion. Lesion is mostly cystic with a lobulated solid
component in the margin. There is demonstrable vascular flow in the
solid component. Cystic portion of the lesion measures 3.9 x 2.6 x
3.3 cm. Solid component in the margin measures 1.4 x 0.8 x 1.2 cm.

Left ovary

Not sonographically visualized.

Pulsed Doppler evaluation of right ovary demonstrates normal
low-resistance arterial and venous waveforms.

Other findings

No abnormal free fluid.
IMPRESSION: There is a complex lesion in the right adnexa with mostly cystic
echogenicity along with a lobulated solid nodule measuring 1.4 cm in
size with demonstrable internal vascularity. Possibility of
malignant neoplastic process in the right ovary is not excluded.

There is inhomogeneous echogenicity in myometrium with 4 cm fibroid.
Left ovary is not sonographically visualized.

ADDENDUM:
This addendum is made to clarify Doppler technique used for the
study. Color flow Doppler examination of right adnexa was performed.
Left ovary was not sonographically visualized. Pulsed Doppler
examination was not performed.

*** End of Addendum ***
FINDINGS: Uterus

Measurements: 5.9 x 2.4 x 5 cm = volume: 37.2 mL. There is 4 x 2.7 x
2.7 cm fibroid in the left side of body.

Endometrium

Thickness: 2.9 mm.  No focal abnormality visualized.

Right ovary

Measurements: 3.9 x 2.9 x 3.4 cm = volume: 20.15 mL. Is a complex
right adnexal lesion. Lesion is mostly cystic with a lobulated solid
component in the margin. There is demonstrable vascular flow in the
solid component. Cystic portion of the lesion measures 3.9 x 2.6 x
3.3 cm. Solid component in the margin measures 1.4 x 0.8 x 1.2 cm.

Left ovary

Not sonographically visualized.

Pulsed Doppler evaluation of right ovary demonstrates normal
low-resistance arterial and venous waveforms.

Other findings

No abnormal free fluid.
IMPRESSION: There is a complex lesion in the right adnexa with mostly cystic
echogenicity along with a lobulated solid nodule measuring 1.4 cm in
size with demonstrable internal vascularity. Possibility of
malignant neoplastic process in the right ovary is not excluded.

There is inhomogeneous echogenicity in myometrium with 4 cm fibroid.
Left ovary is not sonographically visualized.

## 2021-11-13 MED ORDER — HYDRALAZINE HCL 20 MG/ML IJ SOLN: 10.0000 mg | INTRAMUSCULAR | Status: DC | PRN

## 2021-11-13 MED ORDER — ACETAMINOPHEN 650 MG RE SUPP: 650.0000 mg | Freq: Four times a day (QID) | RECTAL | Status: DC | PRN

## 2021-11-13 MED ORDER — METHYLPREDNISOLONE SODIUM SUCC 125 MG IJ SOLR: 125.0000 mg | Freq: Two times a day (BID) | INTRAMUSCULAR | 0 refills | Status: DC

## 2021-11-13 MED ORDER — AMLODIPINE BESYLATE 5 MG PO TABS
5.0000 mg | ORAL_TABLET | Freq: Every day | ORAL | Status: DC
  Administered 2021-11-13: 13:00:00 5 mg via ORAL
  Filled 2021-11-13: qty 1

## 2021-11-13 MED ORDER — TICAGRELOR 90 MG PO TABS
90.0000 mg | ORAL_TABLET | Freq: Two times a day (BID) | ORAL | Status: DC
  Administered 2021-11-13: 09:00:00 90 mg via ORAL
  Filled 2021-11-13 (×4): qty 1

## 2021-11-13 MED ORDER — ENOXAPARIN SODIUM 80 MG/0.8ML IJ SOSY
1.0000 mg/kg | PREFILLED_SYRINGE | Freq: Two times a day (BID) | INTRAMUSCULAR | Status: DC
  Administered 2021-11-13: 21:00:00 67.5 mg via SUBCUTANEOUS
  Filled 2021-11-13 (×2): qty 0.68

## 2021-11-13 MED ORDER — IOHEXOL 300 MG/ML  SOLN
100.0000 mL | Freq: Once | INTRAMUSCULAR | Status: AC | PRN
  Administered 2021-11-13: 08:00:00 100 mL via INTRAVENOUS

## 2021-11-13 MED ORDER — ACETAMINOPHEN 325 MG PO TABS: 650.0000 mg | ORAL_TABLET | Freq: Four times a day (QID) | ORAL | Status: DC | PRN

## 2021-11-13 MED ORDER — HYDRALAZINE HCL 25 MG PO TABS
25.0000 mg | ORAL_TABLET | Freq: Four times a day (QID) | ORAL | Status: DC | PRN
  Administered 2021-11-13: 21:00:00 25 mg via ORAL
  Filled 2021-11-13: qty 1

## 2021-11-13 MED ORDER — ENOXAPARIN SODIUM 80 MG/0.8ML IJ SOSY: 1.0000 mg/kg | PREFILLED_SYRINGE | Freq: Two times a day (BID) | INTRAMUSCULAR | Status: DC

## 2021-11-13 MED ORDER — ATORVASTATIN CALCIUM 20 MG PO TABS
80.0000 mg | ORAL_TABLET | Freq: Every day | ORAL | Status: DC
  Administered 2021-11-13: 16:00:00 80 mg via ORAL
  Filled 2021-11-13 (×2): qty 4

## 2021-11-13 MED ORDER — METHYLPREDNISOLONE SODIUM SUCC 125 MG IJ SOLR: 125.0000 mg | Freq: Two times a day (BID) | INTRAMUSCULAR | Status: DC

## 2021-11-13 MED ORDER — HYDRALAZINE HCL 25 MG PO TABS
25.0000 mg | ORAL_TABLET | Freq: Three times a day (TID) | ORAL | Status: DC | PRN
Start: 2021-11-13 — End: 2022-03-30

## 2021-11-13 MED ORDER — ONDANSETRON 4 MG PO TBDP: 4.0000 mg | ORAL_TABLET | Freq: Once | ORAL | 0 refills | Status: DC | PRN

## 2021-11-13 MED ORDER — ENOXAPARIN SODIUM 40 MG/0.4ML IJ SOSY
40.0000 mg | PREFILLED_SYRINGE | INTRAMUSCULAR | Status: DC
  Administered 2021-11-13: 09:00:00 40 mg via SUBCUTANEOUS
  Filled 2021-11-13 (×2): qty 0.4

## 2021-11-13 MED ORDER — ENOXAPARIN SODIUM 60 MG/0.6ML IJ SOSY
25.0000 mg | PREFILLED_SYRINGE | Freq: Once | INTRAMUSCULAR | Status: AC
  Administered 2021-11-13: 13:00:00 25 mg via SUBCUTANEOUS
  Filled 2021-11-13: qty 0.6

## 2021-11-13 MED ORDER — ASPIRIN 81 MG PO CHEW
81.0000 mg | CHEWABLE_TABLET | Freq: Every day | ORAL | Status: DC
  Administered 2021-11-13: 09:00:00 81 mg via ORAL
  Filled 2021-11-13: qty 1

## 2021-11-13 MED ORDER — PREDNISONE 20 MG PO TABS
60.0000 mg | ORAL_TABLET | Freq: Every day | ORAL | Status: DC
Start: 2021-11-13 — End: 2021-11-13
  Administered 2021-11-13: 06:00:00 60 mg via ORAL
  Filled 2021-11-13: qty 3

## 2021-11-13 MED ORDER — HYDRALAZINE HCL 25 MG PO TABS
25.0000 mg | ORAL_TABLET | Freq: Three times a day (TID) | ORAL | Status: DC | PRN
  Administered 2021-11-13: 17:00:00 25 mg via ORAL
  Filled 2021-11-13: qty 1

## 2021-11-13 NOTE — ED Notes (Signed)
PT STATED SHE FEELS NO SYMPTOMS POST 15 MINUTES BLOOD ADMINISTRATION. RATE CHANGED TO 165ML

## 2021-11-13 NOTE — ED Notes (Signed)
Pt leaving for US.

## 2021-11-13 NOTE — ED Notes (Signed)
Sent message via secure chat to provider EA as 2nd blood unit order remains in chart though previous RN stated it is on a verbal hold. Requested confirmation from provider since order has not been d/c by provider. Awaiting reply.

## 2021-11-13 NOTE — ED Provider Notes (Signed)
----------------------------------------- °  5:48 AM on 11/13/2021 -----------------------------------------   Dr. Janese Banks from hematology indicated that patient would require transfer to [facility] for Rituxan which is unavailable at this facility.  Dr. Hal Hope communicated with [facility] oncologist Dr. Alvy Bimler who will review their resources particularly regarding inpatient chemotherapy and calling back in the morning to see if patient may be accepted in transfer.     Paulette Blanch, MD 11/13/21 [phone]

## 2021-11-13 NOTE — ED Notes (Signed)
Blood administration verified with Eustace Pen RN

## 2021-11-13 NOTE — Discharge Summary (Signed)
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  Patient denied any blood in her stools, no chest pain/shortness of breath, no new weakness from her residual from recent stroke.   In the ED, temperature 97.6 F, HR 96, RR 22, BP 160/80, SPO2 100% on room air.  Sodium 135, potassium 3.4, chloride 103, CO2 18, glucose 265, BUN 38, creatinine 1.06.  AST 34, ALT 20, total bilirubin 6.1.  High sensitive troponin 16.  WBC 22.0, hemoglobin 4.5, platelets 352.  Fibrinogen 510, INR 1.4.  COVID-19 PCR negative.  Influenza A/B PCR negative.  Urinalysis with large hemoglobin, trace leukocytes, 21-50 WBCs.  CT head without contrast no evidence of acute intracranial abnormality, atrophy and chronic small vessel ischemic changes of the white matter with multiple small chronic appearing left ACA infarcts as noted on previous imaging.  Presenting symptoms concerning for hemolytic anemia, case was discussed with on-call oncologist, Dr. Janese Banks.  2 units PRBC ordered.  Hospitalist service consulted for further evaluation and management.  Hospital course:  Hemolytic anemia Patient presenting to the ED with 1 week history of dizziness, generalized weakness.  Was found to have a hemoglobin of 4.5 on admission.  Fibrinogen elevated at 510, LDH 734.  B12/folate within normal limits.  Denies any blood in stool or hematemesis.  Complicated by recent initiation of dual antiplatelet therapy with Brilinta/aspirin from recent stent placement following CVA in November 2022.  Seen by medical  oncology, Dr. 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 --Hgb 4.5>5.0 --Pending transfusion 2 unit PRBCs, repeat H&H 2 hours following transfusion --Peripheral smear: Pending --Hemoglobin/reticulocyte count: Pending --HIV, hepatitis B/C: Pending --G6PD: Pending --Flow cytometry, PNH ordered by oncology --Prednisone 60 mg p.o. x1 today, starting Solu-Medrol 125 mg IV q12 tomorrow 12/31 --Transfer to [facility] long hospital for initiation of Rituxan infusion   Bilateral lower extremity DVT Vascular duplex ultrasound bilateral lower extremities positive for DVT right posterior tibial vein and left peroneal vein. --Pharmacy consulted for treatment dose Lovenox   Right adnexal mass CT chest/abdomen/pelvis notable for a solid and cystic mass within the right adnexa likely representing an ovarian lesion measuring 4.9 cm.  Pelvic ultrasound with complex lesion right adnexa with mostly cystic echogenicity along with a lobulated solid nodule measuring 1.4 cm with demonstratable internal vascularity; possibility of a malignant neoplastic process right ovary not excluded. --CA125 ordered --Will need GYN evaluation   Hx recent CVA s/p thrombectomy with stent placement Patient with recent CVA s/p left A2 thrombectomy with stent placement 10/01/2021 by neuro interventional radiology, Dr. Karenann Cai at [facility].  Discussed with Dr. Norma Fredrickson on 12/30 and given recent stent placement typically keep patients on DAPT for 6 months but when necessity arises can shorten to 3 months but only will discontinue one of the agents before 3 months in very critical cases; his recommendation is to continue at this time; given if these agents are discontinued she is high risk for recurrent stroke. --Continue Brilinta and aspirin   Essential hypertension --  Amlodipine 5 mg p.o. daily --Hydralazine 25  mg p.o. q8h PRN SBP >170 or DBP >110   Hyperlipidemia --Atorvastatin 80 mg p.o. daily  Discharge Diagnoses:  Principal Problem:   Hemolytic anemia (HCC) Active Problems:   HTN (hypertension)   History of stroke   Essential hypertension   DVT (deep venous thrombosis) (HCC)   Adnexal mass    Discharge Instructions  Discharge Instructions     Diet - low sodium heart healthy   Complete by: As directed    Increase activity slowly   Complete by: As directed       Allergies as of 11/13/2021   No Known Allergies      Medication List     TAKE these medications    acetaminophen 325 MG tablet Commonly known as: TYLENOL Take 1-2 tablets (325-650 mg total) by mouth every 4 (four) hours as needed for mild pain.   amLODipine 5 MG tablet Commonly known as: NORVASC Take 1 tablet (5 mg total) by mouth daily.   Aspirin Low Dose 81 MG chewable tablet Generic drug: aspirin Chew 1 tablet (81 mg total) by mouth daily.   atorvastatin 80 MG tablet Commonly known as: LIPITOR Take 1 tablet (80 mg total) by mouth daily.   Brilinta 90 MG Tabs tablet Generic drug: ticagrelor Take 1 tablet (90 mg total) by mouth 2 (two) times daily.   enoxaparin 80 MG/0.8ML injection Commonly known as: LOVENOX Inject 0.675 mLs (67.5 mg total) into the skin every 12 (twelve) hours.   hydrALAZINE 25 MG tablet Commonly known as: APRESOLINE Take 1 tablet (25 mg total) by mouth every 8 (eight) hours as needed (SBP >170 or DBP >110).   magnesium gluconate 500 MG tablet Commonly known as: MAGONATE Take 0.5 tablets (250 mg total) by mouth at bedtime.   methylPREDNISolone sodium succinate 125 mg/2 mL injection Commonly known as: SOLU-MEDROL Inject 2 mLs (125 mg total) into the vein every 12 (twelve) hours. Start taking on: November 14, 2021   nicotine 21 mg/24hr patch Commonly known as: NICODERM CQ - dosed in mg/24 hours Place 1 patch (21 mg total) onto the skin daily.   ondansetron 4 MG  disintegrating tablet Commonly known as: ZOFRAN-ODT Take 1 tablet (4 mg total) by mouth once as needed for nausea.   Senexon-S 8.6-50 MG tablet Generic drug: senna-docusate Take 1 tablet by mouth daily at 6 (six) AM.        No Known Allergies  Consultations: Medical oncology, Dr. Janese Banks Discussed with neuro interventional radiology, Dr. Karenann Cai 12/30 regarding need for continued dual antiplatelet therapy with recent stent placement   Procedures/Studies: CT Head Wo Contrast  Result Date: 11/12/2021 CLINICAL DATA:  Dizziness nausea vomiting EXAM: CT HEAD WITHOUT CONTRAST TECHNIQUE: Contiguous axial images were obtained from the base of the skull through the vertex without intravenous contrast. COMPARISON:  CT brain 10/01/2021 FINDINGS: Brain: No acute territorial infarction, hemorrhage, or intracranial mass. Scattered encephalomalacia within the left frontal lobe and white matter corresponding to infarcts on previous MRI. Mild atrophy. Chronic small vessel ischemic changes of the white matter. Vague hypodensity within the left cerebellum corresponding to small infarct on prior MRI. Vascular: Left A2 stent as before.  Carotid vascular calcifications. Skull: Normal. Negative for fracture or focal lesion. Sinuses/Orbits: No acute finding. Other: None IMPRESSION: 1. No CT evidence for acute intracranial abnormality. Atrophy and chronic small vessel ischemic changes of the white matter 2. Multiple small chronic appearing left ACA infarcts as noted on previous imaging.  Electronically Signed   By: Donavan Foil M.D.   On: 11/12/2021 22:40   CT CHEST ABDOMEN PELVIS W CONTRAST  Result Date: 11/13/2021 CLINICAL DATA:  Occult malignancy.  Nausea. EXAM: CT CHEST, ABDOMEN, AND PELVIS WITH CONTRAST TECHNIQUE: Multidetector CT imaging of the chest, abdomen and pelvis was performed following the standard protocol during bolus administration of intravenous contrast. CONTRAST:  172mL OMNIPAQUE  IOHEXOL 300 MG/ML  SOLN COMPARISON:  None. FINDINGS: CT CHEST FINDINGS Cardiovascular: Heart size is normal. Coronary artery calcifications are present. The appearance of the pulmonary arteries is unremarkable accounting for the contrast bolus timing. There is atherosclerotic calcification of the thoracic aorta, not associated with aneurysm. Mediastinum/Nodes: The visualized portion of the thyroid gland has a normal appearance. There is mild thickening of the LOWER esophageal wall, not associated with discrete mass. No significant mediastinal, hilar, or axillary adenopathy. Lungs/Pleura: Minimal LEFT apical pleuroparenchymal changes. No pulmonary nodules. No consolidations or pleural effusions. Musculoskeletal: Mild midthoracic degenerative changes. CT ABDOMEN PELVIS FINDINGS Hepatobiliary: Mildly prominent RIGHT hepatic lobe, variation of normal. No focal hepatic lesions. Normal parenchymal density. Gallbladder is present. Pancreas: Unremarkable. No pancreatic ductal dilatation or surrounding inflammatory changes. Spleen: Normal in size without focal abnormality. Adrenals/Urinary Tract: Adrenal glands are normal in appearance. A 1 centimeter benign cyst is identified in the UPPER pole of the RIGHT kidney. LEFT kidney is unremarkable. Ureters are unremarkable. The bladder and visualized portion of the urethra are normal. Stomach/Bowel: Mild thickening of the LOWER esophageal wall. Stomach is otherwise unremarkable. Small bowel loops are normal in appearance. Moderate stool burden identified within the rectosigmoid colon. Large bowel is otherwise unremarkable. The appendix is well seen and normal in appearance. Vascular/Lymphatic: There is mild, nonspecific stranding within the mesentery in the RIGHT UPPER QUADRANT. Small lymph nodes are identified within this region, largest measuring 7 millimeters in short axis, image 76 of series 2. No retroperitoneal adenopathy. There is atherosclerotic calcification of the  abdominal aorta, not associated with aneurysm. There is normal vascular opacification of the celiac axis, superior mesenteric artery, and inferior mesenteric artery. Normal appearance of the portal venous system and inferior vena cava. Reproductive: The uterus is present. A central lobulated enhancing mass likely represents fibroid, measuring 3.0 centimeters on image 105 of series 2. The RIGHT adnexal region is notable for a solid and cystic mass measuring 4.9 x 3.4 centimeters on image 102 of series 2. LEFT adnexal region is unremarkable. No ascites. Other: Anterior abdominal wall is notable for fat containing paraumbilical hernia. Musculoskeletal: No acute or significant osseous findings. IMPRESSION: 1. No acute abnormality of the chest. 2. Solid and cystic mass within the RIGHT adnexa, likely representing ovarian lesion measuring 4.9 centimeters. Recommend further characterization with pelvic ultrasound. 3. Nonspecific mesenteric stranding in the RIGHT UPPER QUADRANT, associated subcentimeter lymph nodes. Consider follow-up in 3-6 months to assess for stability. 4.  Aortic atherosclerosis.  (ICD10-I70.0) 5. RIGHT renal cyst. 6. Uterine fibroid. 7. Mild thickening of the LOWER esophagus, raising question of esophagitis. Electronically Signed   By: Nolon Nations M.D.   On: 11/13/2021 08:23   US Venous Img Lower Bilateral (DVT)  Result Date: 11/13/2021 CLINICAL DATA:  Hemolytic anemia EXAM: BILATERAL LOWER EXTREMITY VENOUS DOPPLER ULTRASOUND TECHNIQUE: Gray-scale sonography with compression, as well as color and duplex ultrasound, were performed to evaluate the deep venous system(s) from the level of the common femoral vein through the popliteal and proximal calf veins. COMPARISON:  None. FINDINGS: VENOUS Normal compressibility, phasicity, and augmentation of the common femoral, superficial  femoral, and popliteal veins bilaterally. However, there is occlusive thrombus in the right posterior tibial vein, and  occlusive thrombus in the left peroneal vein. OTHER None. Limitations: none IMPRESSION: 1. Today's exam is positive for bilateral below knee deep vein thrombosis (BKDVT), with occlusive thrombus in the right posterior tibial vein and in the left peroneal vein. Electronically Signed   By: Van Clines M.D.   On: 11/13/2021 09:19   US PELVIC COMPLETE WITH TRANSVAGINAL  Result Date: 11/13/2021 CLINICAL DATA:  Right adnexal mass seen in the CT done earlier today EXAM: TRANSABDOMINAL AND TRANSVAGINAL ULTRASOUND OF PELVIS DOPPLER ULTRASOUND OF OVARIES TECHNIQUE: Both transabdominal and transvaginal ultrasound examinations of the pelvis were performed. Transabdominal technique was performed for global imaging of the pelvis including uterus, ovaries, adnexal regions, and pelvic cul-de-sac. It was necessary to proceed with endovaginal exam following the transabdominal exam to visualize the ovaries. Color and duplex Doppler ultrasound was utilized to evaluate blood flow to the ovaries. COMPARISON:  CT done earlier today FINDINGS: Uterus Measurements: 5.9 x 2.4 x 5 cm = volume: 37.2 mL. There is 4 x 2.7 x 2.7 cm fibroid in the left side of body. Endometrium Thickness: 2.9 mm.  No focal abnormality visualized. Right ovary Measurements: 3.9 x 2.9 x 3.4 cm = volume: 20.15 mL. Is a complex right adnexal lesion. Lesion is mostly cystic with a lobulated solid component in the margin. There is demonstrable vascular flow in the solid component. Cystic portion of the lesion measures 3.9 x 2.6 x 3.3 cm. Solid component in the margin measures 1.4 x 0.8 x 1.2 cm. Left ovary Not sonographically visualized. Pulsed Doppler evaluation of right ovary demonstrates normal low-resistance arterial and venous waveforms. Other findings No abnormal free fluid. IMPRESSION: There is a complex lesion in the right adnexa with mostly cystic echogenicity along with a lobulated solid nodule measuring 1.4 cm in size with demonstrable internal  vascularity. Possibility of malignant neoplastic process in the right ovary is not excluded. 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 Left ovary is not sonographically visualized. Electronically Signed   By: Elmer Picker M.D.   On: 11/13/2021 12:26     Subjective: Patient seen examined bedside, resting comfortably.  Awaiting blood transfusion.  Per oncology needs transfer to [facility] long to initiate Rituxan infusion for autoimmune hemolytic anemia.  Discharge Exam: Vitals:   11/13/21 1546 11/13/21 1601  BP: (!) 176/150 (!) 193/92  Pulse: 75 72  Resp: 20   Temp: 98.5 F (36.9 C)   SpO2: 99% 99%   Vitals:   11/13/21 1347 11/13/21 1507 11/13/21 1546 11/13/21 1601  BP: (!) 168/81 (!) 172/78 (!) 176/150 (!) 193/92  Pulse: 76 74 75 72  Resp: 19 (!) 21 20   Temp: 98.5 F (36.9 C) 98.4 F (36.9 C) 98.5 F (36.9 C)   TempSrc: Oral Oral Oral   SpO2: 95% 99% 99% 99%  Weight:      Height:        General: Pt is alert, awake, not in acute distress Cardiovascular: RRR, S1/S2 +, no rubs, no gallops Respiratory: CTA bilaterally, no wheezing, no rhonchi Abdominal: Soft, NT, ND, bowel sounds + Extremities: no edema, no cyanosis    The results of significant diagnostics from this hospitalization (including imaging, microbiology, ancillary and laboratory) are listed below for reference.     Microbiology: Recent Results (from the past 240 hour(s))  Resp Panel by RT-PCR (Flu A&B, Covid) Nasopharyngeal Swab     Status: None  Collection Time: 11/12/21  9:42 PM   Specimen: Nasopharyngeal Swab; Nasopharyngeal(NP) swabs in vial transport medium  Result Value Ref Range Status   SARS Coronavirus 2 by RT PCR NEGATIVE NEGATIVE Final    Comment: (NOTE) SARS-CoV-2 target nucleic acids are NOT DETECTED.  The SARS-CoV-2 RNA is generally detectable in upper respiratory specimens during the acute phase of infection. The lowest concentration of SARS-CoV-2 viral  copies this assay can detect is 138 copies/mL. A negative result does not preclude SARS-Cov-2 infection and should not be used as the sole basis for treatment or other patient management decisions. A negative result may occur with  improper specimen collection/handling, submission of specimen other than nasopharyngeal swab, presence of viral mutation(s) within the areas targeted by this assay, and inadequate number of viral copies(<138 copies/mL). A negative result must be combined with clinical observations, patient history, and epidemiological information. The expected result is Negative.  Fact Sheet for Patients:  EntrepreneurPulse.com.au  Fact Sheet for Healthcare Providers:  IncredibleEmployment.be  This test is no t yet approved or cleared by the [place] FDA and  has been authorized for detection and/or diagnosis of SARS-CoV-2 by FDA under an Emergency Use Authorization (EUA). This EUA will remain  in effect (meaning this test can be used) for the duration of the COVID-19 declaration under Section 564(b)(1) of the Act, 21 U.S.C.section 360bbb-3(b)(1), unless the authorization is terminated  or revoked sooner.       Influenza A by PCR NEGATIVE NEGATIVE Final   Influenza B by PCR NEGATIVE NEGATIVE Final    Comment: (NOTE) The Xpert Xpress SARS-CoV-2/FLU/RSV plus assay is intended as an aid in the diagnosis of influenza from Nasopharyngeal swab specimens and should not be used as a sole basis for treatment. Nasal washings and aspirates are unacceptable for Xpert Xpress SARS-CoV-2/FLU/RSV testing.  Fact Sheet for Patients: EntrepreneurPulse.com.au  Fact Sheet for Healthcare Providers: IncredibleEmployment.be  This test is not yet approved or cleared by the [place] FDA and has been authorized for detection and/or diagnosis of SARS-CoV-2 by FDA under an Emergency Use Authorization (EUA). This  EUA will remain in effect (meaning this test can be used) for the duration of the COVID-19 declaration under Section 564(b)(1) of the Act, 21 U.S.C. section 360bbb-3(b)(1), unless the authorization is terminated or revoked.  Performed at [facility], [address] [place], [place] [address]      Labs: BNP (last 3 results) No results for input(s): BNP in the last 8760 hours. Basic Metabolic Panel: Recent Labs  Lab 11/12/21 2142  NA 135  K 3.4*  CL 103  CO2 18*  GLUCOSE 265*  BUN 38*  CREATININE 1.06*  CALCIUM 9.6   Liver Function Tests: Recent Labs  Lab 11/12/21 2142 11/12/21 2338  AST 34 28  ALT 20 16  ALKPHOS 96 69  BILITOT 6.1* 4.7*  PROT 7.4 5.3*  ALBUMIN 3.8 2.9*   Recent Labs  Lab 11/12/21 2142  LIPASE 37   No results for input(s): AMMONIA in the last 168 hours. CBC: Recent Labs  Lab 11/12/21 2142 11/13/21 0019  WBC 22.0* 21.4*  NEUTROABS  --  18.1*  HGB 4.5* 5.0*  HCT 13.4* 15.1*  MCV 111.7* 111.0*  PLT 352 348   Cardiac Enzymes: No results for input(s): CKTOTAL, CKMB, CKMBINDEX, TROPONINI in the last 168 hours. BNP: Invalid input(s): POCBNP CBG: No results for input(s): GLUCAP in the last 168 hours. D-Dimer No results for input(s): DDIMER in the last 72 hours. Hgb A1c  No results for input(s): HGBA1C in the last 72 hours. Lipid Profile No results for input(s): CHOL, HDL, LDLCALC, TRIG, CHOLHDL, LDLDIRECT in the last 72 hours. Thyroid function studies No results for input(s): TSH, T4TOTAL, T3FREE, THYROIDAB in the last 72 hours.  Invalid input(s): FREET3 Anemia work up Recent Labs    11/12/21 2250 11/12/21 2338  VITAMINB12  --  389  FOLATE 10.6  --    Urinalysis    Component Value Date/Time   COLORURINE AMBER (A) 11/12/2021 2142   APPEARANCEUR HAZY (A) 11/12/2021 2142   LABSPEC 1.013 11/12/2021 2142   PHURINE 6.0 11/12/2021 2142   GLUCOSEU NEGATIVE 11/12/2021 2142   HGBUR LARGE (A) 11/12/2021 2142    BILIRUBINUR NEGATIVE 11/12/2021 2142   [place] NEGATIVE 11/12/2021 2142   PROTEINUR 100 (A) 11/12/2021 2142   NITRITE NEGATIVE 11/12/2021 2142   LEUKOCYTESUR TRACE (A) 11/12/2021 2142   Sepsis Labs Invalid input(s): PROCALCITONIN,  WBC,  LACTICIDVEN Microbiology Recent Results (from the past 240 hour(s))  Resp Panel by RT-PCR (Flu A&B, Covid) Nasopharyngeal Swab     Status: None   Collection Time: 11/12/21  9:42 PM   Specimen: Nasopharyngeal Swab; Nasopharyngeal(NP) swabs in vial transport medium  Result Value Ref Range Status   SARS Coronavirus 2 by RT PCR NEGATIVE NEGATIVE Final    Comment: (NOTE) SARS-CoV-2 target nucleic acids are NOT DETECTED.  The SARS-CoV-2 RNA is generally detectable in upper respiratory specimens during the acute phase of infection. The lowest concentration of SARS-CoV-2 viral copies this assay can detect is 138 copies/mL. A negative result does not preclude SARS-Cov-2 infection and should not be used as the sole basis for treatment or other patient management decisions. A negative result may occur with  improper specimen collection/handling, submission of specimen other than nasopharyngeal swab, presence of viral mutation(s) within the areas targeted by this assay, and inadequate number of viral copies(<138 copies/mL). A negative result must be combined with clinical observations, patient history, and epidemiological information. The expected result is Negative.  Fact Sheet for Patients:  EntrepreneurPulse.com.au  Fact Sheet for Healthcare Providers:  IncredibleEmployment.be  This test is no t yet approved or cleared by the [place] FDA and  has been authorized for detection and/or diagnosis of SARS-CoV-2 by FDA under an Emergency Use Authorization (EUA). This EUA will remain  in effect (meaning this test can be used) for the duration of the COVID-19 declaration under Section 564(b)(1) of the Act,  21 U.S.C.section 360bbb-3(b)(1), unless the authorization is terminated  or revoked sooner.       Influenza A by PCR NEGATIVE NEGATIVE Final   Influenza B by PCR NEGATIVE NEGATIVE Final    Comment: (NOTE) The Xpert Xpress SARS-CoV-2/FLU/RSV plus assay is intended as an aid in the diagnosis of influenza from Nasopharyngeal swab specimens and should not be used as a sole basis for treatment. Nasal washings and aspirates are unacceptable for Xpert Xpress SARS-CoV-2/FLU/RSV testing.  Fact Sheet for Patients: EntrepreneurPulse.com.au  Fact Sheet for Healthcare Providers: IncredibleEmployment.be  This test is not yet approved or cleared by the [place] FDA and has been authorized for detection and/or diagnosis of SARS-CoV-2 by FDA under an Emergency Use Authorization (EUA). This EUA will remain in effect (meaning this test can be used) for the duration of the COVID-19 declaration under Section 564(b)(1) of the Act, 21 U.S.C. section 360bbb-3(b)(1), unless the authorization is terminated or revoked.  Performed at [facility], [address] [place], [place] [address]  Time coordinating discharge: Over 30 minutes  SIGNED:   Chayim Bialas J [place] DO  [facility] 11/13/2021, 4:42 PM

## 2021-11-13 NOTE — ED Notes (Signed)
Pt eating from lunch tray. Pharm messaged as not enough atorvastatin in pyxis currently to meet dose required on MAR. Verbal confirm to GIVE 2nd unit of blood from Attending provider E. [place] Will give asap.

## 2021-11-13 NOTE — ED Notes (Signed)
Pt currently denies any changes.

## 2021-11-13 NOTE — ED Notes (Signed)
Pt blood transfusion ended at 0602 am.

## 2021-11-13 NOTE — ED Notes (Signed)
Doctor notified of blood bank status on transfusion and type and screen.

## 2021-11-13 NOTE — ED Notes (Signed)
Pt reports is feeling the same as before transfusion of blood. Pt has more energy.

## 2021-11-13 NOTE — Progress Notes (Signed)
ANTICOAGULATION CONSULT NOTE - Initial Consult  Pharmacy Consult for enoxaparin Indication: VTE Treatment- bilateral LE DVT+  No Known Allergies  Patient Measurements: Height: 5\' 3"  (160 cm) Weight: 68 kg (150 lb) IBW/kg (Calculated) : 52.4 HEPARIN DW (KG):   Vital Signs: Temp: 98.7 F (37.1 C) (12/30 0600) Temp Source: Oral (12/30 0600) BP: 167/79 (12/30 1030) Pulse Rate: 81 (12/30 1115)  Labs: Recent Labs    11/12/21 2142 11/12/21 2338 11/13/21 0019  HGB 4.5*  --  5.0*  HCT 13.4*  --  15.1*  PLT 352  --  348  APTT  --   --  27  LABPROT  --  16.7*  --   INR  --  1.4*  --   CREATININE 1.06*  --   --   TROPONINIHS 16  --   --      Estimated Creatinine Clearance: 45.7 mL/min (A) (by C-G formula based on SCr of 1.06 mg/dL (H)).   Medical History: Past Medical History:  Diagnosis Date   Hypertension    Stroke [facility])     Medications:  PTA medications of note include ticargrelor and aspirin   Assessment: 70 year old female w recent stroke (intracranial thrombectomy and stent placed Nov 2022- on ticagrelor and aspirin prior to admission) presenting with dizziness/fatigue and found to have Hgb 4.5, labs concerning for hemolytic anemia.-?autoimmune. Pharmacy has been consulted to dose enoxaparin. -initially for prophylaxis but now change to treamtent dose for bilateral LE + DVT  Latest Hgb 5; platelets WNL; received 2 units PRBC earlier this morning. No overt bleeding noted. Confirmed with heme/onc provider that okay to restart antiplatelets/anticoagulants today.   Goal of Therapy:  Monitor platelets by anticoagulation protocol: Yes   Plan:  Enoxaparin 40mg  subcutaneously was given x 1 at 0910. Will add Lovenox 25 mg x 1 for total dose of 65 mg initially today. Continue with Lovenox 1 mg/kg q12h Monitor CBC, continue to monitor closely for signs/symptoms of bleeding Planning to transfer to [facility] or [facility] for rituximab infusion today (lack of resources available to  administer rituxan at [place])   Chinita Greenland PharmD Clinical Pharmacist 11/13/2021

## 2021-11-13 NOTE — ED Notes (Signed)
Informed RN bed assigned 

## 2021-11-13 NOTE — ED Notes (Signed)
PER VERBAL ORDER WITH DR [facility] this RN communicated with Dr. Raliegh Ip to given one unit emergency blood until patients blood with antibodies have been resulted from blood bank.

## 2021-11-13 NOTE — Consult Note (Signed)
Hematology/Oncology Consult note [facility] [phone] [phone]  Patient Care Team: Pcp, No as PCP - General   Name of the patient: Andrea Williamson  [id number]  [birth year]    Reason for referral-concern for hemolytic anemia   Referring physician- DR. Charna Archer  Date of visit: 11/13/2021    History of presenting illness-patient is a 70 year old female with a past medical history significant for hypertension and hyperlipidemia who was admitted to [facility] for symptoms of acute stroke when she presented with right lower extremity weakness and difficulty speaking on 10/01/2021.  CT head showed acute infarct in the left frontal lobe ACA territory.  She underwent cerebral angiogram and underwent thrombectomy with revascularization and left P2 stenting by Dr. Norma Fredrickson and subsequently discharged to rehab.  She was started on aspirin and Brilinta.  During that admission patient had a normal CBC with a white count of 9.3, H&H of 13/37.8 and a platelet count of 245.  Patient was noted to have increasing icterus as well as nausea and dizziness which prompted her visit to the ER.  On admission patient was noted to have a white cell count of 22, H&H of 4.5/13.4 and MCV of 111 with a platelet count of 352.  Total bilirubin was elevated at 6 thereby raising the concern for possible hemolytic anemia.  Hematology has therefore been consulted.  ECOG PS- 2  Pain scale- 0   Review of systems- Review of Systems  Constitutional:  Positive for malaise/fatigue. Negative for chills, fever and weight loss.  HENT:  Negative for congestion, ear discharge and nosebleeds.   Eyes:  Negative for blurred vision.  Respiratory:  Negative for cough, hemoptysis, sputum production, shortness of breath and wheezing.   Cardiovascular:  Negative for chest pain, palpitations, orthopnea and claudication.  Gastrointestinal:  Negative for abdominal pain, blood in stool, constipation,  diarrhea, heartburn, melena, nausea and vomiting.  Genitourinary:  Negative for dysuria, flank pain, frequency, hematuria and urgency.  Musculoskeletal:  Negative for back pain, joint pain and myalgias.  Skin:  Negative for rash.  Neurological:  Negative for dizziness, tingling, focal weakness, seizures, weakness and headaches.  Endo/Heme/Allergies:  Does not bruise/bleed easily.  Psychiatric/Behavioral:  Negative for depression and suicidal ideas. The patient does not have insomnia.    No Known Allergies  Patient Active Problem List   Diagnosis Date Noted   History of stroke 11/13/2021   Essential hypertension 11/13/2021   Hemolytic anemia ([place]) 11/12/2021   HTN (hypertension) 10/19/2021   Constipation 10/19/2021   Elevated cholesterol 10/15/2021   Acute renal injury ([place]) 10/15/2021   Acute ischemic left ACA stroke ([place]) 10/07/2021   Stroke (cerebrum) ([place]) 10/01/2021     Past Medical History:  Diagnosis Date   Hypertension    Stroke [facility])      Past Surgical History:  Procedure Laterality Date   ANKLE SURGERY Left 2008   IR CT HEAD LTD  10/05/2021   IR INTRA CRAN STENT  10/01/2021   IR PERCUTANEOUS ART THROMBECTOMY/INFUSION INTRACRANIAL INC DIAG ANGIO  10/01/2021   IR US GUIDE VASC ACCESS RIGHT  10/01/2021   RADIOLOGY WITH ANESTHESIA N/A 10/01/2021   Procedure: IR WITH ANESTHESIA;  Surgeon: Radiologist, Medication, MD;  Location: [place];  Service: Radiology;  Laterality: N/A;    Social History   Socioeconomic History   Marital status: Married    Spouse name: Not on file   Number of children: Not on file   Years of education: Not on file  Highest education level: Not on file  Occupational History   Not on file  Tobacco Use   Smoking status: Never   Smokeless tobacco: Never  Vaping Use   Vaping Use: Never used  Substance and Sexual Activity   Alcohol use: Not Currently   Drug use: Not Currently   Sexual activity: Not Currently  Other Topics Concern   Not  on file  Social History Narrative   Not on file   Social Determinants of Health   Financial Resource Strain: Not on file  Food Insecurity: Not on file  Transportation Needs: Not on file  Physical Activity: Not on file  Stress: Not on file  Social Connections: Not on file  Intimate Partner Violence: Not on file     History reviewed. No pertinent family history.   Current Facility-Administered Medications:    0.9 %  sodium chloride infusion, 10 mL/hr, Intravenous, Once, Rise Patience, MD, Held at 11/12/21 2256   acetaminophen (TYLENOL) tablet 650 mg, 650 mg, Oral, Q6H PRN **OR** acetaminophen (TYLENOL) suppository 650 mg, 650 mg, Rectal, Q6H PRN, Rise Patience, MD   hydrALAZINE (APRESOLINE) injection 10 mg, 10 mg, Intravenous, Q4H PRN, Rise Patience, MD   ondansetron (ZOFRAN-ODT) disintegrating tablet 4 mg, 4 mg, Oral, Once PRN, Rise Patience, MD   predniSONE (DELTASONE) tablet 60 mg, 60 mg, Oral, Q breakfast, Rise Patience, MD, 60 mg at 11/13/21 0626  Current Outpatient Medications:    amLODipine (NORVASC) 5 MG tablet, Take 1 tablet (5 mg total) by mouth daily., Disp: 30 tablet, Rfl: 0   aspirin 81 MG chewable tablet, Chew 1 tablet (81 mg total) by mouth daily., Disp: 100 tablet, Rfl: 0   atorvastatin (LIPITOR) 80 MG tablet, Take 1 tablet (80 mg total) by mouth daily., Disp: 30 tablet, Rfl: 0   magnesium gluconate (MAGONATE) 500 MG tablet, Take 0.5 tablets (250 mg total) by mouth at bedtime., Disp: 30 tablet, Rfl: 0   nicotine (NICODERM CQ - DOSED IN MG/24 HOURS) 21 mg/24hr patch, Place 1 patch (21 mg total) onto the skin daily., Disp: 28 patch, Rfl: 0   senna-docusate (SENOKOT-S) 8.6-50 MG tablet, Take 1 tablet by mouth daily at 6 (six) AM., Disp: 30 tablet, Rfl: 0   ticagrelor (BRILINTA) 90 MG TABS tablet, Take 1 tablet (90 mg total) by mouth 2 (two) times daily., Disp: 60 tablet, Rfl: 0   acetaminophen (TYLENOL) 325 MG tablet, Take 1-2 tablets  (325-650 mg total) by mouth every 4 (four) hours as needed for mild pain., Disp: , Rfl:    Physical exam:  Vitals:   11/13/21 0515 11/13/21 0530 11/13/21 0545 11/13/21 0600  BP: (!) 171/75 (!) 182/80 (!) 161/93 (!) 175/93  Pulse: 78 84 75 78  Resp: 19 17 19 16   Temp:    98.7 F (37.1 C)  TempSrc:    Oral  SpO2: 98% 99% 100% 95%  Weight:      Height:       Physical Exam HENT:     Head: Normocephalic and atraumatic.     Mouth/Throat:     Mouth: Mucous membranes are moist.     Pharynx: Oropharynx is clear.  Eyes:     Comments: Scleral icterus  Cardiovascular:     Rate and Rhythm: Normal rate and regular rhythm.     Heart sounds: Normal heart sounds.  Pulmonary:     Effort: Pulmonary effort is normal.     Breath sounds: Normal breath sounds.  Abdominal:  General: Bowel sounds are normal.     Palpations: Abdomen is soft.     Comments: No palpable hepatosplenomegaly  Musculoskeletal:     Cervical back: Normal range of motion.  Lymphadenopathy:     Comments: No palpable cervical, supraclavicular, axillary or inguinal adenopathy    Skin:    General: Skin is warm and dry.  Neurological:     Mental Status: She is alert and oriented to person, place, and time.       CMP Latest Ref Rng & Units 11/12/2021  Glucose 70 - 99 mg/dL -  BUN 8 - 23 mg/dL -  Creatinine 0.44 - 1.00 mg/dL -  Sodium 135 - 145 mmol/L -  Potassium 3.5 - 5.1 mmol/L -  Chloride 98 - 111 mmol/L -  CO2 22 - 32 mmol/L -  Calcium 8.9 - 10.3 mg/dL -  Total Protein 6.5 - 8.1 g/dL 5.3(L)  Total Bilirubin 0.3 - 1.2 mg/dL 4.7(H)  Alkaline Phos 38 - 126 U/L 69  AST 15 - 41 U/L 28  ALT 0 - 44 U/L 16   CBC Latest Ref Rng & Units 11/13/2021  WBC 4.0 - 10.5 K/uL 21.4(H)  Hemoglobin 12.0 - 15.0 g/dL 5.0(L)  Hematocrit 36.0 - 46.0 % 15.1(L)  Platelets 150 - 400 K/uL 348    @IMAGES @  CT Head Wo Contrast  Result Date: 11/12/2021 CLINICAL DATA:  Dizziness nausea vomiting EXAM: CT HEAD WITHOUT CONTRAST  TECHNIQUE: Contiguous axial images were obtained from the base of the skull through the vertex without intravenous contrast. COMPARISON:  CT brain 10/01/2021 FINDINGS: Brain: No acute territorial infarction, hemorrhage, or intracranial mass. Scattered encephalomalacia within the left frontal lobe and white matter corresponding to infarcts on previous MRI. Mild atrophy. Chronic small vessel ischemic changes of the white matter. Vague hypodensity within the left cerebellum corresponding to small infarct on prior MRI. Vascular: Left A2 stent as before.  Carotid vascular calcifications. Skull: Normal. Negative for fracture or focal lesion. Sinuses/Orbits: No acute finding. Other: None IMPRESSION: 1. No CT evidence for acute intracranial abnormality. Atrophy and chronic small vessel ischemic changes of the white matter 2. Multiple small chronic appearing left ACA infarcts as noted on previous imaging. Electronically Signed   By: Donavan Foil M.D.   On: 11/12/2021 22:40    Assessment and plan- Patient is a 70 y.o. 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  Haptoglobin and reticulocyte count are currently pending.  Pathology smear review currently pending.  B12 and folate are normal.  No new medications other than Brilinta as per patient history.  Brilinta has been associated with TTP but I did not find a particular association with warm autoimmune hemolytic anemia.  Patient does not have any evidence of thrombocytopenia making TTP unlikely.  Okay to continue Brilinta for now.   Recommend DVT prophylaxis as well.  With regards to etiology of warm autoimmune hemolytic anemia I would suggest getting CT chest abdomen pelvis with contrast to rule out any malignancy especially given her recent episode of stroke as well.  I will order a flow cytometry and I  recommend HIV, hep B  as well as hepatitis C testing.  I will also check flow cytometry as well as PNH testing.  I will also recommend b/l LE doppler to r/o DVT  Given that patient is significantly anemic she has already received 1 unit of PRBC transfusion.  Attempt to keep hemoglobin close to 7 if possible although she may not respond well to transfusions in the setting of hemolysis.  Typically treatment for warm autoimmune hemolytic anemia would be a combination of steroids and Rituxan.  I have recommended prednisone 1 mg/kg and she has received 1 dose of 60 mg prednisone along with PPI.  I will switch her to Solu-Medrol 40 mg IV every 12 starting tomorrow.  However it is very likely that given her significant anemia and ongoing hemolysis she would benefit from Rituxan.  Due to changes in our cancer center policy and affiliation with the hospital we are unable to administer any inpatient chemotherapy for the next 2 to 3 weeks.  I would therefore recommend transfer to a facility which can give her Rituxan expeditiously in the next 1 to 2 days.  This has been communicated with the admitting hospitalist who did get in touch with [facility] Dr. Alvy Bimler.  She is reviewing if the patient can be transferred there to receive Rituxan over the weekend.  If [facility] is unable to accept the patient I would recommend transferring the patient to any other tertiary facility such as [facility] or [facility] where Rituxan can be administered in the inpatient setting.   Thank you for this kind referral and the opportunity to participate in the care of this patient   Visit Diagnosis 1. Acquired hemolytic anemia (HCC)   2. Lightheadedness     Dr.  Randa Evens, MD, MPH [facility] [phone] 11/13/2021 9:00 AM

## 2021-11-13 NOTE — ED Notes (Addendum)
Sent msg to clarify ordered anticoagulation meds, received confirmation from attending hospitalist that these meds were ordered by oncologist.

## 2021-11-13 NOTE — ED Notes (Signed)
Since phlebotomy has not yet stopped by to collect scheduled blood work as previous nurse reported they plan to this RN will collect once unit of blood finished running.

## 2021-11-13 NOTE — ED Notes (Signed)
Lab staff will come back for morning labs. Were unable to get into pt chart this morning because other providers logged in.

## 2021-11-13 NOTE — H&P (Addendum)
History and Physical    Andrea Williamson TMA:[id number] DOB: 22-Aug-1951 DOA: 11/12/2021  PCP: Pcp, No  Patient coming from: Home.  Chief Complaint: Denies nausea weakness.  HPI: Andrea Williamson is a 70 y.o. female with history of recent stroke status post left A2 thrombectomy and stent placement in November 2022 with history of hypertension and hyperlipidemia was found to be increasingly weak dizzy nausea over the last 1 week.  Patient was brought to the ER.  Did not notice any blood in the stools.  Did not have any chest pain or shortness of breath or any new weakness.  ED Course: In the ER patient had labs done which shows WBC of 22,000 and hemoglobin of 4.5 which is significant drop from previous last month of 13.  High sensitive troponin was 16 creatinine 1.06 potassium 3.4 blood glucose 265 COVID test was negative.  Patient's total bilirubin was 6.1 and symptoms are concerning for hemolytic anemia.  ER physician discussed with on-call oncologist Dr. Janese Banks advise getting LDH haptoglobin blood smear review.  Transfusing 2 units of PRBC and they will be seeing patient in consult.   Review of Systems: As per HPI, rest all negative.   Past Medical History:  Diagnosis Date   Hypertension    Stroke [facility])     Past Surgical History:  Procedure Laterality Date   ANKLE SURGERY Left 2008   IR CT HEAD LTD  10/05/2021   IR INTRA CRAN STENT  10/01/2021   IR PERCUTANEOUS ART THROMBECTOMY/INFUSION INTRACRANIAL INC DIAG ANGIO  10/01/2021   IR US GUIDE VASC ACCESS RIGHT  10/01/2021   RADIOLOGY WITH ANESTHESIA N/A 10/01/2021   Procedure: IR WITH ANESTHESIA;  Surgeon: Radiologist, Medication, MD;  Location: [facility];  Service: Radiology;  Laterality: N/A;     reports that she has never smoked. She has never used smokeless tobacco. She reports that she does not currently use alcohol. She reports that she does not currently use drugs.  No Known Allergies  History reviewed. No pertinent family  history.  Prior to Admission medications   Medication Sig Start Date End Date Taking? Authorizing Provider  amLODipine (NORVASC) 5 MG tablet Take 1 tablet (5 mg total) by mouth daily. 10/16/21  Yes Love, Ivan Anchors, PA-C  aspirin 81 MG chewable tablet Chew 1 tablet (81 mg total) by mouth daily. 10/16/21  Yes Love, Ivan Anchors, PA-C  atorvastatin (LIPITOR) 80 MG tablet Take 1 tablet (80 mg total) by mouth daily. 10/16/21  Yes Love, Ivan Anchors, PA-C  magnesium gluconate (MAGONATE) 500 MG tablet Take 0.5 tablets (250 mg total) by mouth at bedtime. 10/16/21  Yes Love, Ivan Anchors, PA-C  nicotine (NICODERM CQ - DOSED IN MG/24 HOURS) 21 mg/24hr patch Place 1 patch (21 mg total) onto the skin daily. 10/16/21  Yes Love, Ivan Anchors, PA-C  senna-docusate (SENOKOT-S) 8.6-50 MG tablet Take 1 tablet by mouth daily at 6 (six) AM. 10/16/21  Yes Love, Ivan Anchors, PA-C  ticagrelor (BRILINTA) 90 MG TABS tablet Take 1 tablet (90 mg total) by mouth 2 (two) times daily. 10/16/21  Yes Love, Ivan Anchors, PA-C  acetaminophen (TYLENOL) 325 MG tablet Take 1-2 tablets (325-650 mg total) by mouth every 4 (four) hours as needed for mild pain. 10/16/21   Bary Leriche, PA-C    Physical Exam: Constitutional: Moderately built and nourished. Vitals:   11/12/21 2133 11/12/21 2135 11/12/21 2136  BP:   (!) 160/80  Pulse: 96    Resp: (!) 22  Temp: 97.6 F (36.4 C)    TempSrc: Oral    SpO2: 100%    Weight:  68 kg   Height:  5\' 3"  (1.6 m)    Eyes: Anicteric mild pallor. ENMT: No discharge from the ears eyes nose and mouth. Neck: No mass felt.  No neck rigidity. Respiratory: No rhonchi or crepitations. Cardiovascular: S1-S2 heard. Abdomen: Soft nontender bowel sound present. Musculoskeletal: No edema. Skin: No rash. Neurologic: Alert awake oriented time place and person.  Mild weakness of the right upper and lower extremity 4 x 5 rest of EXTR 5 x 5. Psychiatric: Appears normal.  Normal affect.   Labs on Admission: I have personally  reviewed following labs and imaging studies  CBC: Recent Labs  Lab 11/12/21 2142 11/13/21 0019  WBC 22.0* 21.4*  NEUTROABS  --  18.1*  HGB 4.5* 5.0*  HCT 13.4* 15.1*  MCV 111.7* 111.0*  PLT 352 119   Basic Metabolic Panel: Recent Labs  Lab 11/12/21 2142  NA 135  K 3.4*  CL 103  CO2 18*  GLUCOSE 265*  BUN 38*  CREATININE 1.06*  CALCIUM 9.6   GFR: Estimated Creatinine Clearance: 45.7 mL/min (A) (by C-G formula based on SCr of 1.06 mg/dL (H)). Liver Function Tests: Recent Labs  Lab 11/12/21 2142 11/12/21 2338  AST 34 28  ALT 20 16  ALKPHOS 96 69  BILITOT 6.1* 4.7*  PROT 7.4 5.3*  ALBUMIN 3.8 2.9*   Recent Labs  Lab 11/12/21 2142  LIPASE 37   No results for input(s): AMMONIA in the last 168 hours. Coagulation Profile: Recent Labs  Lab 11/12/21 2338  INR 1.4*   Cardiac Enzymes: No results for input(s): CKTOTAL, CKMB, CKMBINDEX, TROPONINI in the last 168 hours. BNP (last 3 results) No results for input(s): PROBNP in the last 8760 hours. HbA1C: No results for input(s): HGBA1C in the last 72 hours. CBG: No results for input(s): GLUCAP in the last 168 hours. Lipid Profile: No results for input(s): CHOL, HDL, LDLCALC, TRIG, CHOLHDL, LDLDIRECT in the last 72 hours. Thyroid Function Tests: No results for input(s): TSH, T4TOTAL, FREET4, T3FREE, THYROIDAB in the last 72 hours. Anemia Panel: Recent Labs    11/12/21 2250  FOLATE 10.6   Urine analysis:    Component Value Date/Time   COLORURINE STRAW (A) 10/01/2021 1723   APPEARANCEUR CLEAR 10/01/2021 1723   LABSPEC >1.046 (H) 10/01/2021 1723   PHURINE 6.0 10/01/2021 1723   GLUCOSEU NEGATIVE 10/01/2021 1723   HGBUR NEGATIVE 10/01/2021 1723   BILIRUBINUR NEGATIVE 10/01/2021 1723   KETONESUR 5 (A) 10/01/2021 1723   PROTEINUR 30 (A) 10/01/2021 1723   NITRITE NEGATIVE 10/01/2021 1723   LEUKOCYTESUR NEGATIVE 10/01/2021 1723   Sepsis Labs: @LABRCNTIP (procalcitonin:4,lacticidven:4) ) Recent Results  (from the past 240 hour(s))  Resp Panel by RT-PCR (Flu A&B, Covid) Nasopharyngeal Swab     Status: None   Collection Time: 11/12/21  9:42 PM   Specimen: Nasopharyngeal Swab; Nasopharyngeal(NP) swabs in vial transport medium  Result Value Ref Range Status   SARS Coronavirus 2 by RT PCR NEGATIVE NEGATIVE Final    Comment: (NOTE) SARS-CoV-2 target nucleic acids are NOT DETECTED.  The SARS-CoV-2 RNA is generally detectable in upper respiratory specimens during the acute phase of infection. The lowest concentration of SARS-CoV-2 viral copies this assay can detect is 138 copies/mL. A negative result does not preclude SARS-Cov-2 infection and should not be used as the sole basis for treatment or other patient management decisions. A negative result may occur with  improper specimen collection/handling, submission of specimen other than nasopharyngeal swab, presence of viral mutation(s) within the areas targeted by this assay, and inadequate number of viral copies(<138 copies/mL). A negative result must be combined with clinical observations, patient history, and epidemiological information. The expected result is Negative.  Fact Sheet for Patients:  EntrepreneurPulse.com.au  Fact Sheet for Healthcare Providers:  IncredibleEmployment.be  This test is no t yet approved or cleared by the [place] FDA and  has been authorized for detection and/or diagnosis of SARS-CoV-2 by FDA under an Emergency Use Authorization (EUA). This EUA will remain  in effect (meaning this test can be used) for the duration of the COVID-19 declaration under Section 564(b)(1) of the Act, 21 U.S.C.section 360bbb-3(b)(1), unless the authorization is terminated  or revoked sooner.       Influenza A by PCR NEGATIVE NEGATIVE Final   Influenza B by PCR NEGATIVE NEGATIVE Final    Comment: (NOTE) The Xpert Xpress SARS-CoV-2/FLU/RSV plus assay is intended as an aid in the  diagnosis of influenza from Nasopharyngeal swab specimens and should not be used as a sole basis for treatment. Nasal washings and aspirates are unacceptable for Xpert Xpress SARS-CoV-2/FLU/RSV testing.  Fact Sheet for Patients: EntrepreneurPulse.com.au  Fact Sheet for Healthcare Providers: IncredibleEmployment.be  This test is not yet approved or cleared by the [place] FDA and has been authorized for detection and/or diagnosis of SARS-CoV-2 by FDA under an Emergency Use Authorization (EUA). This EUA will remain in effect (meaning this test can be used) for the duration of the COVID-19 declaration under Section 564(b)(1) of the Act, 21 U.S.C. section 360bbb-3(b)(1), unless the authorization is terminated or revoked.  Performed at [facility], [address] [place], [place] [address]      Radiological Exams on Admission: CT Head Wo Contrast  Result Date: 11/12/2021 CLINICAL DATA:  Dizziness nausea vomiting EXAM: CT HEAD WITHOUT CONTRAST TECHNIQUE: Contiguous axial images were obtained from the base of the skull through the vertex without intravenous contrast. COMPARISON:  CT brain 10/01/2021 FINDINGS: Brain: No acute territorial infarction, hemorrhage, or intracranial mass. Scattered encephalomalacia within the left frontal lobe and white matter corresponding to infarcts on previous MRI. Mild atrophy. Chronic small vessel ischemic changes of the white matter. Vague hypodensity within the left cerebellum corresponding to small infarct on prior MRI. Vascular: Left A2 stent as before.  Carotid vascular calcifications. Skull: Normal. Negative for fracture or focal lesion. Sinuses/Orbits: No acute finding. Other: None IMPRESSION: 1. No CT evidence for acute intracranial abnormality. Atrophy and chronic small vessel ischemic changes of the white matter 2. Multiple small chronic appearing left ACA infarcts as noted on previous imaging.  Electronically Signed   By: Donavan Foil M.D.   On: 11/12/2021 22:40    EKG: Independently reviewed.  Normal sinus rhythm.  Assessment/Plan Principal Problem:   Hemolytic anemia (HCC) Active Problems:   History of stroke   Essential hypertension    Hemolytic anemia -patient's presenting features are concerning for hemolytic anemia for which oncologist Dr. Janese Banks has been consulted.  At this time blood work including LDH smear review haptoglobin has been ordered.  2 units of PRBC has been ordered.  Further recommendations per oncologist. Recent stroke status post left-sided A2 thrombectomy and stent placement takes Brilinta and aspirin and statins.  Will discuss with patient's oncologist and also neuro cardiologist about continuing the medication of severe anemia. Hypertension uncontrolled keep patient n.p.o. IV hydralazine and continue home medication once reconciliation done. Hyperglycemia check hemoglobin A1c.  Leukocytosis no signs of infection.  We will follow oncology recommendations.  Since patient has hemolytic anemia will need close monitoring and inpatient status.  Addendum -discussed with Dr. Janese Banks on-call oncologist.  As per Dr. Janese Banks patient has warm autoimmune hemolytic anemia and likely will need Rituxan and steroids.  Recommending placing patient on prednisone 60 mg p.o. daily and also to check Dopplers of the lower extremity to rule out DVT and also checking HIV and CT scans to rule out malignancy.  Also advised to transfer patient to [facility] or other tertiary care centers since patient will need inpatient chemotherapy.  As per Dr. Janese Banks okay to continue aspirin and Brilinta.    I have discussed with the on-call oncologist Dr. Alvy Bimler at [facility] who will be reviewing the resources and calling us back in a few hours to see if patient can be accepted to [facility].   DVT prophylaxis: SCDs for now.  Given the severe anemia holding anticoagulation. Code Status: Full code. Family  Communication: Patient's daughter. Disposition Plan: Home. Consults called: Oncologist. Admission status: Inpatient.   Rise Patience MD [facility] Pager [phone].  If 7PM-7AM, please contact night-coverage [email or web address] Password [email or web address]  11/13/2021, 2:24 AM

## 2021-11-13 NOTE — ED Notes (Signed)
Oncologist at bedside explaining plan of care for transfer.   Ultrasound at bedside.

## 2021-11-13 NOTE — Progress Notes (Signed)
PROGRESS NOTE    Andrea Williamson  PRF:[id number] DOB: [birth year] DOA: 11/12/2021 PCP: Merryl Hacker, No    Brief Narrative:  Andrea Williamson is a 70 year old female with past medical history significant for recent CVA s/p left A2 thrombectomy with stent placement 10/01/2021, HTN, HLD who presented to [facility] ED on 12/29 with weakness, dizziness, and nausea for the past week.  Patient denied any blood in her stools, no chest pain/shortness of breath, no new weakness from her residual from recent stroke.  In the ED, temperature 97.6 F, HR 96, RR 22, BP 160/80, SPO2 100% on room air.  Sodium 135, potassium 3.4, chloride 103, CO2 18, glucose 265, BUN 38, creatinine 1.06.  AST 34, ALT 20, total bilirubin 6.1.  High sensitive troponin 16.  WBC 22.0, hemoglobin 4.5, platelets 352.  Fibrinogen 510, INR 1.4.  COVID-19 PCR negative.  Influenza A/B PCR negative.  Urinalysis with large hemoglobin, trace leukocytes, 21-50 WBCs.  CT head without contrast no evidence of acute intracranial abnormality, atrophy and chronic small vessel ischemic changes of the white matter with multiple small chronic appearing left ACA infarcts as noted on previous imaging.  Presenting symptoms concerning for hemolytic anemia, case was discussed with on-call oncologist, Dr. Janese Banks.  2 units PRBC ordered.  Hospitalist service consulted for further evaluation and management.   Assessment & Plan:   Principal Problem:   Hemolytic anemia (HCC) Active Problems:   HTN (hypertension)   History of stroke   Essential hypertension   DVT (deep venous thrombosis) (HCC)   Adnexal mass   Hemolytic anemia Patient presenting to the ED with 1 week history of dizziness, generalized weakness.  Was found to have a hemoglobin of 4.5 on admission.  Fibrinogen elevated at 510, LDH 734.  B12/folate within normal limits.  Denies any blood in stool or hematemesis.  Complicated by recent initiation of dual antiplatelet therapy with Brilinta/aspirin from recent stent placement  following CVA in November 2022.  Seen by medical oncology, Dr. 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 --Hgb 4.5>5.0 --Pending transfusion 2 unit PRBCs, repeat H&H 2 hours following transfusion --Peripheral smear: Pending --Hemoglobin/reticulocyte count: Pending --HIV, hepatitis B/C: Pending --G6PD: Pending --Flow cytometry, [facility] ordered by oncology --Prednisone 60 mg p.o. x1 today, starting Solu-Medrol 125 mg IV q12 tomorrow 12/31 --Pending transfer to [facility] long  Bilateral lower extremity DVT Vascular duplex ultrasound bilateral lower extremities positive for DVT right posterior tibial vein and left peroneal vein. --Pharmacy consulted for treatment dose Lovenox  Right adnexal mass CT chest/abdomen/pelvis notable for a solid and cystic mass within the right adnexa likely representing an ovarian lesion measuring 4.9 cm. --Pelvic ultrasound ordered for further evaluation  Hx recent CVA s/p thrombectomy with stent placement Patient with recent CVA s/p left A2 thrombectomy with stent placement 10/01/2021 by neuro interventional radiology, Dr. Karenann Cai at [facility].  Discussed with Dr. Norma Fredrickson on 12/30 and given recent stent placement typically keep patients on DAPT for 6 months but when necessity arises can shorten to 3 months but only will discontinue one of the agents before 3 months in very critical cases; his recommendation is to continue at this time; given if these agents are discontinued she is high risk for recurrent stroke. --Continue Brilinta and aspirin  Essential hypertension --Amlodipine 5 mg p.o. daily --Hydralazine 25 mg p.o. q8h PRN SBP >170 or DBP >110  Hyperlipidemia --Atorvastatin 80 mg p.o. daily  DVT prophylaxis: enoxaparin (LOVENOX) injection 40 mg Start: 11/13/21 1000   Code Status: Full Code Family Communication: Updated patient  spouse and daughter present at bedside this morning  Disposition Plan:  Level of care: Telemetry Medical; transferred to [facility] long for Rituxan infusion Status is: Inpatient  Remains inpatient appropriate because: Requires transfer for toxin infusion to [place] long as [facility] unable to support    Consultants:  Medical oncology, Dr. Janese Banks Neuro interventional radiology - discussed with Dr. Karenann Cai 12/30 regarding need for continued dual antiplatelet therapy with recent stent placement  Procedures:  Vascular duplex ultrasound bilateral lower extremities Pelvic ultrasound: Pending  Antimicrobials:  None   Subjective: Patient seen examined bedside, resting comfortably.  Currently dizziness resolved but continues with generalized weakness.  Still awaiting blood transfusion.  Discussed case with medical oncology, Dr. Janese Banks and requesting transfer for Rituxan infusion as cannot be accomplished at [facility].  Also discussed with interventional radiology who recommended continued dual antiplatelet therapy given recent stent placement.  Updated patient's spouse and daughter present at bedside.  Awaiting bed for transfer to [facility] long.  No other complaints or concerns at this time.  Denies headache, no chest pain, no palpitations, no shortness of breath, no abdominal pain, no fever/chills/night sweats, no nausea/vomiting/diarrhea, no paresthesias.  No acute events overnight per nursing staff.  Objective: Vitals:   11/13/21 0900 11/13/21 0930 11/13/21 1000 11/13/21 1030  BP: (!) 159/91 (!) 166/81 (!) 161/78 (!) 167/79  Pulse: 86 77 81 81  Resp: 18 19 19 19   Temp:      TempSrc:      SpO2: 93% 95% 94% 95%  Weight:      Height:        Intake/Output Summary (Last 24 hours) at 11/13/2021 1119 Last data filed at 11/13/2021 0823 Gross per 24 hour  Intake --  Output 700 ml  Net -700 ml   Filed Weights   11/12/21 2135  Weight: 68 kg    Examination:  General exam: Appears calm and  comfortable  Respiratory system: Clear to auscultation. Respiratory effort normal.  On room air Cardiovascular system: S1 & S2 heard, RRR. No JVD, murmurs, rubs, gallops or clicks. No pedal edema. Gastrointestinal system: Abdomen is nondistended, soft and nontender. No organomegaly or masses felt. Normal bowel sounds heard. Central nervous system: Alert and oriented. No focal neurological deficits. Extremities: Symmetric 5 x 5 power. Skin: No rashes, lesions or ulcers Psychiatry: Judgement and insight appear normal. Mood & affect appropriate.     Data Reviewed: I have personally reviewed following labs and imaging studies  CBC: Recent Labs  Lab 11/12/21 2142 11/13/21 0019  WBC 22.0* 21.4*  NEUTROABS  --  18.1*  HGB 4.5* 5.0*  HCT 13.4* 15.1*  MCV 111.7* 111.0*  PLT 352 588   Basic Metabolic Panel: Recent Labs  Lab 11/12/21 2142  NA 135  K 3.4*  CL 103  CO2 18*  GLUCOSE 265*  BUN 38*  CREATININE 1.06*  CALCIUM 9.6   GFR: Estimated Creatinine Clearance: 45.7 mL/min (A) (by C-G formula based on SCr of 1.06 mg/dL (H)). Liver Function Tests: Recent Labs  Lab 11/12/21 2142 11/12/21 2338  AST 34 28  ALT 20 16  ALKPHOS 96 69  BILITOT 6.1* 4.7*  PROT 7.4 5.3*  ALBUMIN 3.8 2.9*   Recent Labs  Lab 11/12/21 2142  LIPASE 37   No results for input(s): AMMONIA in the last 168 hours. Coagulation Profile: Recent Labs  Lab 11/12/21 2338  INR 1.4*   Cardiac Enzymes: No results for input(s): CKTOTAL, CKMB, CKMBINDEX, TROPONINI in the last 168 hours. BNP (last 3 results) No results for input(s): PROBNP in the last 8760 hours. HbA1C: No results for input(s): HGBA1C in the last 72 hours. CBG: No results for input(s): GLUCAP in the last 168 hours. Lipid Profile: No results for input(s): CHOL, HDL, LDLCALC, TRIG, CHOLHDL, LDLDIRECT in the last 72 hours. Thyroid Function Tests: No results for input(s): TSH, T4TOTAL, FREET4, T3FREE, THYROIDAB in the last 72  hours. Anemia Panel: Recent Labs    11/12/21 2250 11/12/21 2338  VITAMINB12  --  389  FOLATE 10.6  --    Sepsis Labs: No results for input(s): PROCALCITON, LATICACIDVEN in the last 168 hours.  Recent Results (from the past 240 hour(s))  Resp Panel by RT-PCR (Flu A&B, Covid) Nasopharyngeal Swab     Status: None   Collection Time: 11/12/21  9:42 PM   Specimen: Nasopharyngeal Swab; Nasopharyngeal(NP) swabs in vial transport medium  Result Value Ref Range Status   SARS Coronavirus 2 by RT PCR NEGATIVE NEGATIVE Final    Comment: (NOTE) SARS-CoV-2 target nucleic acids are NOT DETECTED.  The SARS-CoV-2 RNA is generally detectable in upper respiratory specimens during the acute phase of infection. The lowest concentration of SARS-CoV-2 viral copies this assay can detect is 138 copies/mL. A negative result does not preclude SARS-Cov-2 infection and should not be used as the sole basis for treatment or other patient management decisions. A negative result may occur with  improper specimen collection/handling, submission of specimen other than nasopharyngeal swab, presence of viral mutation(s) within the areas targeted by this assay, and inadequate number of viral copies(<138 copies/mL). A negative result must be combined with clinical observations, patient history, and epidemiological information. The expected result is Negative.  Fact Sheet for Patients:  EntrepreneurPulse.com.au  Fact Sheet for Healthcare Providers:  IncredibleEmployment.be  This test is no t yet approved or cleared by the [place] FDA and  has been authorized for detection and/or diagnosis of SARS-CoV-2 by FDA under an Emergency Use Authorization (EUA). This EUA will remain  in effect (meaning this test can be used) for the duration of the COVID-19 declaration under Section 564(b)(1) of the Act, 21 U.S.C.section 360bbb-3(b)(1), unless the authorization is terminated  or  revoked sooner.       Influenza A by PCR NEGATIVE NEGATIVE Final   Influenza B by PCR NEGATIVE NEGATIVE Final    Comment: (NOTE) The Xpert Xpress SARS-CoV-2/FLU/RSV plus assay is intended as an aid in the diagnosis of influenza from Nasopharyngeal swab specimens and should not be used as a sole basis for treatment. Nasal washings and aspirates are unacceptable for Xpert Xpress SARS-CoV-2/FLU/RSV testing.  Fact Sheet for Patients: EntrepreneurPulse.com.au  Fact Sheet for Healthcare Providers: IncredibleEmployment.be  This test is not yet approved or cleared by the [place] FDA and has been authorized for detection and/or diagnosis of SARS-CoV-2 by FDA under an Emergency Use Authorization (EUA). This EUA will remain in effect (meaning this test can be used) for the duration of the COVID-19 declaration under Section 564(b)(1) of the Act, 21 U.S.C. section 360bbb-3(b)(1), unless the authorization is terminated or revoked.  Performed at [facility], [address], [place], [place] [address]          Radiology Studies: CT Head Wo Contrast  Result Date: 11/12/2021 CLINICAL DATA:  Dizziness nausea vomiting EXAM: CT HEAD WITHOUT CONTRAST TECHNIQUE: Contiguous axial images were obtained from the base of  the skull through the vertex without intravenous contrast. COMPARISON:  CT brain 10/01/2021 FINDINGS: Brain: No acute territorial infarction, hemorrhage, or intracranial mass. Scattered encephalomalacia within the left frontal lobe and white matter corresponding to infarcts on previous MRI. Mild atrophy. Chronic small vessel ischemic changes of the white matter. Vague hypodensity within the left cerebellum corresponding to small infarct on prior MRI. Vascular: Left A2 stent as before.  Carotid vascular calcifications. Skull: Normal. Negative for fracture or focal lesion. Sinuses/Orbits: No acute finding. Other: None IMPRESSION: 1. No CT  evidence for acute intracranial abnormality. Atrophy and chronic small vessel ischemic changes of the white matter 2. Multiple small chronic appearing left ACA infarcts as noted on previous imaging. Electronically Signed   By: Donavan Foil M.D.   On: 11/12/2021 22:40   CT CHEST ABDOMEN PELVIS W CONTRAST  Result Date: 11/13/2021 CLINICAL DATA:  Occult malignancy.  Nausea. EXAM: CT CHEST, ABDOMEN, AND PELVIS WITH CONTRAST TECHNIQUE: Multidetector CT imaging of the chest, abdomen and pelvis was performed following the standard protocol during bolus administration of intravenous contrast. CONTRAST:  152mL OMNIPAQUE IOHEXOL 300 MG/ML  SOLN COMPARISON:  None. FINDINGS: CT CHEST FINDINGS Cardiovascular: Heart size is normal. Coronary artery calcifications are present. The appearance of the pulmonary arteries is unremarkable accounting for the contrast bolus timing. There is atherosclerotic calcification of the thoracic aorta, not associated with aneurysm. Mediastinum/Nodes: The visualized portion of the thyroid gland has a normal appearance. There is mild thickening of the LOWER esophageal wall, not associated with discrete mass. No significant mediastinal, hilar, or axillary adenopathy. Lungs/Pleura: Minimal LEFT apical pleuroparenchymal changes. No pulmonary nodules. No consolidations or pleural effusions. Musculoskeletal: Mild midthoracic degenerative changes. CT ABDOMEN PELVIS FINDINGS Hepatobiliary: Mildly prominent RIGHT hepatic lobe, variation of normal. No focal hepatic lesions. Normal parenchymal density. Gallbladder is present. Pancreas: Unremarkable. No pancreatic ductal dilatation or surrounding inflammatory changes. Spleen: Normal in size without focal abnormality. Adrenals/Urinary Tract: Adrenal glands are normal in appearance. A 1 centimeter benign cyst is identified in the UPPER pole of the RIGHT kidney. LEFT kidney is unremarkable. Ureters are unremarkable. The bladder and visualized portion of the  urethra are normal. Stomach/Bowel: Mild thickening of the LOWER esophageal wall. Stomach is otherwise unremarkable. Small bowel loops are normal in appearance. Moderate stool burden identified within the rectosigmoid colon. Large bowel is otherwise unremarkable. The appendix is well seen and normal in appearance. Vascular/Lymphatic: There is mild, nonspecific stranding within the mesentery in the RIGHT UPPER QUADRANT. Small lymph nodes are identified within this region, largest measuring 7 millimeters in short axis, image 76 of series 2. No retroperitoneal adenopathy. There is atherosclerotic calcification of the abdominal aorta, not associated with aneurysm. There is normal vascular opacification of the celiac axis, superior mesenteric artery, and inferior mesenteric artery. Normal appearance of the portal venous system and inferior vena cava. Reproductive: The uterus is present. A central lobulated enhancing mass likely represents fibroid, measuring 3.0 centimeters on image 105 of series 2. The RIGHT adnexal region is notable for a solid and cystic mass measuring 4.9 x 3.4 centimeters on image 102 of series 2. LEFT adnexal region is unremarkable. No ascites. Other: Anterior abdominal wall is notable for fat containing paraumbilical hernia. Musculoskeletal: No acute or significant osseous findings. IMPRESSION: 1. No acute abnormality of the chest. 2. Solid and cystic mass within the RIGHT adnexa, likely representing ovarian lesion measuring 4.9 centimeters. Recommend further characterization with pelvic ultrasound. 3. Nonspecific mesenteric stranding in the RIGHT UPPER QUADRANT, associated subcentimeter lymph nodes. Consider  follow-up in 3-6 months to assess for stability. 4.  Aortic atherosclerosis.  (ICD10-I70.0) 5. RIGHT renal cyst. 6. Uterine fibroid. 7. Mild thickening of the LOWER esophagus, raising question of esophagitis. Electronically Signed   By: Nolon Nations M.D.   On: 11/13/2021 08:23   US Venous  Img Lower Bilateral (DVT)  Result Date: 11/13/2021 CLINICAL DATA:  Hemolytic anemia EXAM: BILATERAL LOWER EXTREMITY VENOUS DOPPLER ULTRASOUND TECHNIQUE: Gray-scale sonography with compression, as well as color and duplex ultrasound, were performed to evaluate the deep venous system(s) from the level of the common femoral vein through the popliteal and proximal calf veins. COMPARISON:  None. FINDINGS: VENOUS Normal compressibility, phasicity, and augmentation of the common femoral, superficial femoral, and popliteal veins bilaterally. However, there is occlusive thrombus in the right posterior tibial vein, and occlusive thrombus in the left peroneal vein. OTHER None. Limitations: none IMPRESSION: 1. Today's exam is positive for bilateral below knee deep vein thrombosis (BKDVT), with occlusive thrombus in the right posterior tibial vein and in the left peroneal vein. Electronically Signed   By: Van Clines M.D.   On: 11/13/2021 09:19        Scheduled Meds:  aspirin  81 mg Oral Daily   enoxaparin (LOVENOX) injection  40 mg Subcutaneous Q24H   [START ON 11/14/2021] methylPREDNISolone (SOLU-MEDROL) injection  125 mg Intravenous Q12H   ticagrelor  90 mg Oral BID   Continuous Infusions:  sodium chloride Stopped (11/12/21 2256)     LOS: 1 day    Time spent: 46 minutes spent on chart review, discussion with nursing staff, consultants, updating family and interview/physical exam; more than 50% of that time was spent in counseling and/or coordination of care.    Antha Niday J [place] DO [facility] Available via Epic secure chat 7am-7pm After these hours, please refer to coverage provider listed on [email or web address] 11/13/2021, 11:19 AM

## 2021-11-13 NOTE — Progress Notes (Signed)
ANTICOAGULATION CONSULT NOTE - Initial Consult  Pharmacy Consult for enoxaparin Indication: VTE prophylaxis  No Known Allergies  Patient Measurements: Height: 5\' 3"  (160 cm) Weight: 68 kg (150 lb) IBW/kg (Calculated) : 52.4 HEPARIN DW (KG): 66.3  Vital Signs: Temp: 98.7 F (37.1 C) (12/30 0600) Temp Source: Oral (12/30 0600) BP: 159/91 (12/30 0900) Pulse Rate: 86 (12/30 0900)  Labs: Recent Labs    11/12/21 2142 11/12/21 2338 11/13/21 0019  HGB 4.5*  --  5.0*  HCT 13.4*  --  15.1*  PLT 352  --  348  APTT  --   --  27  LABPROT  --  16.7*  --   INR  --  1.4*  --   CREATININE 1.06*  --   --   TROPONINIHS 16  --   --     Estimated Creatinine Clearance: 45.7 mL/min (A) (by C-G formula based on SCr of 1.06 mg/dL (H)).   Medical History: Past Medical History:  Diagnosis Date   Hypertension    Stroke [facility])     Medications:  PTA medications of note include ticargrelor and aspirin   Assessment: 70 year old female w recent stroke (intracranial thrombectomy and stent placed Nov 2022- on ticagrelor and aspirin prior to admission) presenting with dizziness/fatigue and found to have Hgb 4.5, labs concerning for hemolytic anemia. Pharmacy has been consulted to dose enoxaparin.  Latest Hgb 5; platelets WNL; received 2 units PRBC earlier this morning. No overt bleeding noted. Confirmed with heme/onc provider that okay to restart antiplatelets/anticoagulants today.   Goal of Therapy:  Monitor platelets by anticoagulation protocol: Yes   Plan:  Enoxaparin 40mg  subcutaneously q24h Monitor CBC, continue to monitor closely for signs/symptoms of bleeding Planning to transfer to [facility] or [facility] for rituximab infusion today (lack of resources available to administer rituxan at [place])   Brendolyn Patty, PharmD Clinical Pharmacist  11/13/2021   10:07 AM

## 2021-11-14 ENCOUNTER — Other Ambulatory Visit: Payer: Self-pay

## 2021-11-14 ENCOUNTER — Encounter (HOSPITAL_COMMUNITY): Payer: Self-pay | Admitting: Family Medicine

## 2021-11-14 DIAGNOSIS — I82403 Acute embolism and thrombosis of unspecified deep veins of lower extremity, bilateral: Secondary | ICD-10-CM

## 2021-11-14 DIAGNOSIS — D598 Other acquired hemolytic anemias: Secondary | ICD-10-CM

## 2021-11-14 DIAGNOSIS — I63322 Cerebral infarction due to thrombosis of left anterior cerebral artery: Secondary | ICD-10-CM

## 2021-11-14 DIAGNOSIS — D5919 Other autoimmune hemolytic anemia: Secondary | ICD-10-CM | POA: Diagnosis not present

## 2021-11-14 DIAGNOSIS — I10 Essential (primary) hypertension: Secondary | ICD-10-CM

## 2021-11-14 DIAGNOSIS — N9489 Other specified conditions associated with female genital organs and menstrual cycle: Secondary | ICD-10-CM

## 2021-11-14 LAB — CBC
HCT: 23.2 % — ABNORMAL LOW (ref 36.0–46.0)
Hemoglobin: 8 g/dL — ABNORMAL LOW (ref 12.0–15.0)
MCH: 34.8 pg — ABNORMAL HIGH (ref 26.0–34.0)
MCHC: 34.5 g/dL (ref 30.0–36.0)
MCV: 100.9 fL — ABNORMAL HIGH (ref 80.0–100.0)
Platelets: 282 10*3/uL (ref 150–400)
RBC: 2.3 MIL/uL — ABNORMAL LOW (ref 3.87–5.11)
RDW: 24.8 % — ABNORMAL HIGH (ref 11.5–15.5)
WBC: 28.3 10*3/uL — ABNORMAL HIGH (ref 4.0–10.5)
nRBC: 28 % — ABNORMAL HIGH (ref 0.0–0.2)

## 2021-11-14 LAB — COMPREHENSIVE METABOLIC PANEL
ALT: 17 U/L (ref 0–44)
AST: 41 U/L (ref 15–41)
Albumin: 3.6 g/dL (ref 3.5–5.0)
Alkaline Phosphatase: 95 U/L (ref 38–126)
Anion gap: 10 (ref 5–15)
BUN: 32 mg/dL — ABNORMAL HIGH (ref 8–23)
CO2: 24 mmol/L (ref 22–32)
Calcium: 9.3 mg/dL (ref 8.9–10.3)
Chloride: 101 mmol/L (ref 98–111)
Creatinine, Ser: 1.1 mg/dL — ABNORMAL HIGH (ref 0.44–1.00)
GFR, Estimated: 54 mL/min — ABNORMAL LOW (ref >60)
Glucose, Bld: 104 mg/dL — ABNORMAL HIGH (ref 70–99)
Potassium: 3 mmol/L — ABNORMAL LOW (ref 3.5–5.1)
Sodium: 135 mmol/L (ref 135–145)
Total Bilirubin: 5 mg/dL — ABNORMAL HIGH (ref 0.3–1.2)
Total Protein: 6.9 g/dL (ref 6.5–8.1)

## 2021-11-14 LAB — RETICULOCYTES
Immature Retic Fract: 39.5 % — ABNORMAL HIGH (ref 2.3–15.9)
Retic Count, Absolute: 474 10*3/uL — ABNORMAL HIGH (ref 19.0–186.0)
Retic Ct Pct: 19.4 % — ABNORMAL HIGH (ref 0.4–3.1)

## 2021-11-14 LAB — ABO/RH: ABO/RH(D): O POS

## 2021-11-14 LAB — HEPARIN LEVEL (UNFRACTIONATED)
Heparin Unfractionated: 0.48 IU/mL (ref 0.30–0.70)
Heparin Unfractionated: 0.46 IU/mL (ref 0.30–0.70)

## 2021-11-14 LAB — LACTATE DEHYDROGENASE: LDH: 1143 U/L — ABNORMAL HIGH (ref 98–192)

## 2021-11-14 LAB — MAGNESIUM: Magnesium: 2.3 mg/dL (ref 1.7–2.4)

## 2021-11-14 MED ORDER — ACETAMINOPHEN 650 MG RE SUPP: 650.0000 mg | Freq: Four times a day (QID) | RECTAL | Status: DC | PRN

## 2021-11-14 MED ORDER — HEPARIN (PORCINE) 25000 UT/250ML-% IV SOLN
900.0000 [IU]/h | INTRAVENOUS | Status: AC
  Administered 2021-11-14 – 2021-11-16 (×3): 900 [IU]/h via INTRAVENOUS
  Filled 2021-11-14 (×4): qty 250

## 2021-11-14 MED ORDER — ONDANSETRON HCL 4 MG/2ML IJ SOLN: 4.0000 mg | Freq: Four times a day (QID) | INTRAMUSCULAR | Status: DC | PRN

## 2021-11-14 MED ORDER — METHYLPREDNISOLONE SODIUM SUCC 125 MG IJ SOLR
125.0000 mg | INTRAMUSCULAR | Status: DC
  Administered 2021-11-15 – 2021-11-18 (×4): 125 mg via INTRAVENOUS
  Filled 2021-11-14 (×4): qty 2

## 2021-11-14 MED ORDER — METHYLPREDNISOLONE SODIUM SUCC 125 MG IJ SOLR
125.0000 mg | Freq: Two times a day (BID) | INTRAMUSCULAR | Status: DC
  Administered 2021-11-14: 125 mg via INTRAVENOUS
  Filled 2021-11-14: qty 2

## 2021-11-14 MED ORDER — ATORVASTATIN CALCIUM 40 MG PO TABS
80.0000 mg | ORAL_TABLET | Freq: Every day | ORAL | Status: DC
  Administered 2021-11-14 – 2021-11-20 (×7): 80 mg via ORAL
  Filled 2021-11-14 (×4): qty 2
  Filled 2021-11-14: qty 1
  Filled 2021-11-14 (×3): qty 2

## 2021-11-14 MED ORDER — ASPIRIN 81 MG PO CHEW
81.0000 mg | CHEWABLE_TABLET | Freq: Every day | ORAL | Status: DC
  Administered 2021-11-14: 81 mg via ORAL
  Filled 2021-11-14: qty 1

## 2021-11-14 MED ORDER — ONDANSETRON HCL 4 MG PO TABS: 4.0000 mg | ORAL_TABLET | Freq: Four times a day (QID) | ORAL | Status: DC | PRN

## 2021-11-14 MED ORDER — ALPRAZOLAM 0.25 MG PO TABS
0.2500 mg | ORAL_TABLET | Freq: Three times a day (TID) | ORAL | Status: DC | PRN
  Administered 2021-11-14 (×2): 0.25 mg via ORAL
  Filled 2021-11-14 (×2): qty 1

## 2021-11-14 MED ORDER — SODIUM CHLORIDE 0.9% FLUSH
3.0000 mL | Freq: Two times a day (BID) | INTRAVENOUS | Status: DC
  Administered 2021-11-14 – 2021-11-20 (×10): 3 mL via INTRAVENOUS

## 2021-11-14 MED ORDER — HYDRALAZINE HCL 25 MG PO TABS: 25.0000 mg | ORAL_TABLET | Freq: Three times a day (TID) | ORAL | Status: DC | PRN

## 2021-11-14 MED ORDER — SENNOSIDES-DOCUSATE SODIUM 8.6-50 MG PO TABS
1.0000 | ORAL_TABLET | Freq: Every day | ORAL | Status: DC
  Administered 2021-11-14 – 2021-11-20 (×6): 1 via ORAL
  Filled 2021-11-14 (×7): qty 1

## 2021-11-14 MED ORDER — ACETAMINOPHEN 325 MG PO TABS: 650.0000 mg | ORAL_TABLET | Freq: Four times a day (QID) | ORAL | Status: DC | PRN

## 2021-11-14 MED ORDER — AMLODIPINE BESYLATE 5 MG PO TABS
5.0000 mg | ORAL_TABLET | Freq: Every day | ORAL | Status: DC
  Administered 2021-11-14 – 2021-11-20 (×7): 5 mg via ORAL
  Filled 2021-11-14 (×7): qty 1

## 2021-11-14 MED ORDER — TICAGRELOR 90 MG PO TABS
90.0000 mg | ORAL_TABLET | Freq: Two times a day (BID) | ORAL | Status: DC
  Administered 2021-11-14: 90 mg via ORAL
  Filled 2021-11-14 (×2): qty 1

## 2021-11-14 NOTE — Progress Notes (Signed)
PROGRESS NOTE  Andrea Williamson  DOB: 30-Apr-1951  PCP: Kathyrn Lass KDT:[id number]  DOA: 11/13/2021  LOS: 1 day  Hospital Day: 2  Chief complaint: dizziness, nausea, vomiting  Brief narrative: Andrea Williamson is a 70 y.o. female with PMH significant for HTN, HLD and recent left ACA stroke status post left A2 thrombectomy and stent placement on 10/01/2021. On 12/20, patient was brought from home to ED at [facility] with dizziness, nausea and vomiting. On initial work-up, she was found to have a hemoglobin of 4.5 which is a significant drop from 13 last month.  Bilirubin was elevated to 6.1. Hematology was consulted.  Hemolytic anemia was suspected. LDH 734, fibrinogen 510, and INR 1.4.  Patient was given 2 units of PRBC transfusion and started on systemic steroid.   She was transferred to [facility] with a plan to initiate Rituxan. While at [facility], patient was also noted to have bilateral lower extremity DVT and started on anticoagulation. CT chest/abdomen/pelvis that was concerning for a right adnexal mass.  Pelvic ultrasound suggested a solid nodule of 1.4 cm and right ovary suspicious for malignancy.  Subjective: Patient was seen and examined this morning.  Pleasant elderly Caucasian female, lying down in bed.  Not in distress.  Daughter at bedside.  Patient visibly anxious.  Patient daughter has complaints and questions about the motive of transfers between hospitals.  I answered her questions to the best I could. Chart reviewed Potassium level low at 3, WBC count elevated to 28.3, hemoglobin better at 8  Assessment/Plan: Warm autoimmune hemolytic anemia  -Presented with dizziness, generalized weakness, low hemoglobin of 4.5, elevated LDH, fibrinogen.   -Normal vitamin B12 and folate level. -Patient received units of PRBC transfusion and has been started on systemic steroids.  Currently receiving Solu-Medrol 125 mg IV every 12 hours. -Hematology consult appreciated.  Transferred from [facility] to court for  initiation of Rituxan. -Other work-up in progress.  HIV, hep B/hep C nonreactive.  G6PD pending.  PNH, flow cytometry pending. -Hematology Dr. Lindi Adie on board. Recent Labs    10/08/21 0516 10/12/21 0610 11/12/21 2142 11/12/21 2250 11/12/21 2338 11/13/21 0019 11/13/21 1508 11/14/21 0154 11/14/21 0931  HGB 14.1 13.0 4.5*  --   --  5.0*  --  8.0*  --   MCV 95.5 95.2 111.7*  --   --  111.0*  --  100.9*  --   VITAMINB12  --   --   --   --  389  --   --   --   --   FOLATE  --   --   --  10.6  --   --   --   --   --   RETICCTPCT  --   --   --   --   --   --  21.2*  --  19.4*   Bilateral lower extremity DVT -bilateral lower extremities positive for DVT right posterior tibial vein and left peroneal vein  -Currently on IV heparin.  Right adnexal mass  -CT chest/abdomen/pelvis notable for a solid and cystic mass within the right adnexa likely representing an ovarian lesion measuring 4.9 cm.  Pelvic ultrasound with complex lesion right adnexa with mostly cystic echogenicity along with a lobulated solid nodule measuring 1.4 cm with demonstratable internal vascularity; possibility of a malignant neoplastic process right ovary not excluded  -CA125 ordered -Will need GYN evaluation inpatient versus outpatient.   Hx of recent CVA s/p thrombectomy and stent  Left ACA CVA d/t  A2 occlusion s/p thrombectomy and stent on 10/01/21.  Since then patient has been on dual antiplatelet therapy with aspirin Brilinta loading CVA in November 2022.  -Currently patient remains on aspirin and Brilinta.  Case was discussed with neurologist Dr. Theda Sers.  Since patient is already on anticoagulation with heparin drip, I will stop aspirin and Brilinta at this time as it poses high risk of bleeding.  At discharge, she will be on anticoagulation for DVT.   Hypertension  -Continue Norvasc    Hyperlipidemia  -Continue atorvastatin   Anxiety -Patient and daughter asked for something to control anxiety.  Xanax as needed  low-dose started.  Mobility: Encourage ambulation Living condition: Lives at home Goals of care:   Code Status: Full Code  Nutritional status: There is no height or weight on file to calculate BMI.      Diet:  Diet Order             Diet Heart Room service appropriate? Yes; Fluid consistency: Thin  Diet effective now                  DVT prophylaxis: Heparin drip currently    Antimicrobials: None Fluid: None Consultants: Hematology Family Communication: Daughter at bedside  Status is: Inpatient  Continue in-hospital care because: Needs Rituxan, further hematology work-up Level of care: Telemetry   Dispo: The patient is from: Home              Anticipated d/c is to: Hopefully home in next few days              Patient currently is not medically stable to d/c.   Difficult to place patient No     Infusions:   heparin 900 Units/hr (11/14/21 1012)    Scheduled Meds:  amLODipine  5 mg Oral Daily   aspirin  81 mg Oral Daily   atorvastatin  80 mg Oral Daily   [START ON 11/15/2021] methylPREDNISolone sodium succinate  125 mg Intravenous Q24H   senna-docusate  1 tablet Oral Daily   sodium chloride flush  3 mL Intravenous Q12H   ticagrelor  90 mg Oral BID    PRN meds: acetaminophen **OR** acetaminophen, ALPRAZolam, hydrALAZINE, ondansetron **OR** ondansetron (ZOFRAN) IV   Antimicrobials: Anti-infectives (From admission, onward)    None       Objective: Vitals:   11/14/21 1124 11/14/21 1457  BP: (!) 153/92 137/74  Pulse: 70 76  Resp: 16 15  Temp: 97.8 F (36.6 C) 98.2 F (36.8 C)  SpO2: 98% 94%    Intake/Output Summary (Last 24 hours) at 11/14/2021 1508 Last data filed at 11/14/2021 0815 Gross per 24 hour  Intake 240 ml  Output --  Net 240 ml   There were no vitals filed for this visit. Weight change:  There is no height or weight on file to calculate BMI.   Physical Exam: General exam: Pleasant, elderly Caucasian female.  Not in physical  distress Skin: No rashes, lesions or ulcers. HEENT: Atraumatic, normocephalic, no obvious bleeding Lungs: Clear to auscultation bilaterally CVS: Regular rate and rhythm with no murmur GI/Abd soft, nontender, nondistended, bowel sound present CNS: Alert, awake, oriented x3 Psychiatry: Anxious Extremities: No pedal edema, no calf redness  Data Review: I have personally reviewed the laboratory data and studies available.  F/u labs ordered Unresulted Labs (From admission, onward)     Start     Ordered   11/15/21 0500  CBC  Daily,   R  11/14/21 0311   11/15/21 0500  Comprehensive metabolic panel  Daily,   R      11/14/21 0926   11/14/21 1700  Heparin level (unfractionated)  Once-Timed,   TIMED        11/14/21 0358   11/14/21 0927  Lactate dehydrogenase  Daily,   R     Comments: Add to drawn labs   Question:  Release to patient  Answer:  Immediate   11/14/21 0926   11/14/21 0927  Reticulocytes  Daily,   R     Comments: Add to drawn labs    11/14/21 0926   11/14/21 0154  Pathologist smear review  Once,   R        11/14/21 0154            Signed, Terrilee Croak, MD [facility] 11/14/2021

## 2021-11-14 NOTE — Progress Notes (Signed)
ANTICOAGULATION CONSULT NOTE - Initial Consult  Pharmacy Consult for Lovenox>>Heparin  Indication: Bilateral LE DVT  No Known Allergies  Vital Signs: Temp: 98.2 F (36.8 C) (12/31 0334) Temp Source: Oral (12/31 0334) BP: 139/81 (12/31 0334) Pulse Rate: 72 (12/31 0334)  Labs: Recent Labs    11/12/21 2142 11/12/21 2338 11/13/21 0019 11/14/21 0154  HGB 4.5*  --  5.0* 8.0*  HCT 13.4*  --  15.1* 23.2*  PLT 352  --  348 282  APTT  --   --  27  --   LABPROT  --  16.7*  --   --   INR  --  1.4*  --   --   CREATININE 1.06*  --   --  1.10*  TROPONINIHS 16  --   --   --     Estimated Creatinine Clearance: 44 mL/min (A) (by C-G formula based on SCr of 1.1 mg/dL (H)).   Medical History: Past Medical History:  Diagnosis Date   Hypertension    Stroke [facility])     Assessment: 70 y/o F with hemolytic anemia transferred from [facility] to [facility] so she could receive Rituxan. Pt also has bilateral LE DVT which she has been receiving Lovenox for at [facility]. Switching to heparin in case anti-coagulation needs to be stopped quickly. Previous Hgb was 5, no bleeding, Hgb s/p transfusion is up to 8.   Goal of Therapy:  Heparin level 0.3-0.7 units/ml Monitor platelets by anticoagulation protocol: Yes   Plan:  -Start heparin drip at 900 units/hr at 0900 (12 hours from last Lovenox dose) -1700 Heparin level -Daily CBC/Heparin level -Monitor for bleeding  Narda Bonds, PharmD, BCPS Clinical Pharmacist Phone: [phone]

## 2021-11-14 NOTE — Consult Note (Addendum)
[facility] CONSULT NOTE  Patient Care Team: Pcp, No as PCP - General  CHIEF COMPLAINTS/PURPOSE OF CONSULTATION:  Auto Immune Hemolytic Anemia  HISTORY OF PRESENTING ILLNESS:  Andrea Williamson 70 y.o. female has ben transferred from [facility] because of severe autoimmune hemolytic anemia. Patient received 2 untis of PRBC and was started on Solumedrol. Because [facility] do in-patient chemo, patient was transferred here. Today the Hemoglobin was upto 8 and therefore, Rituxan was not given today. Patient lying in bed says shes feeling better.  I reviewed her records extensively and collaborated the history with the patient.   MEDICAL HISTORY:  Past Medical History:  Diagnosis Date   Hypertension    Stroke [facility])     SURGICAL HISTORY: Past Surgical History:  Procedure Laterality Date   ANKLE SURGERY Left 2008   IR CT HEAD LTD  10/05/2021   IR INTRA CRAN STENT  10/01/2021   IR PERCUTANEOUS ART THROMBECTOMY/INFUSION INTRACRANIAL INC DIAG ANGIO  10/01/2021   IR US GUIDE VASC ACCESS RIGHT  10/01/2021   RADIOLOGY WITH ANESTHESIA N/A 10/01/2021   Procedure: IR WITH ANESTHESIA;  Surgeon: Radiologist, Medication, MD;  Location: [place];  Service: Radiology;  Laterality: N/A;    SOCIAL HISTORY: Social History   Socioeconomic History   Marital status: Married    Spouse name: Not on file   Number of children: Not on file   Years of education: Not on file   Highest education level: Not on file  Occupational History   Not on file  Tobacco Use   Smoking status: Never   Smokeless tobacco: Never  Vaping Use   Vaping Use: Never used  Substance and Sexual Activity   Alcohol use: Not Currently   Drug use: Not Currently   Sexual activity: Not Currently  Other Topics Concern   Not on file  Social History Narrative   Not on file   Social Determinants of Health   Financial Resource Strain: Not on file  Food Insecurity: Not on file  Transportation Needs: Not on file  Physical  Activity: Not on file  Stress: Not on file  Social Connections: Not on file  Intimate Partner Violence: Not on file    FAMILY HISTORY: History reviewed. No pertinent family history.  ALLERGIES:  has No Known Allergies.  MEDICATIONS:  Current Facility-Administered Medications  Medication Dose Route Frequency Provider Last Rate Last Admin   acetaminophen (TYLENOL) tablet 650 mg  650 mg Oral Q6H PRN Opyd, Ilene Qua, MD       Or   acetaminophen (TYLENOL) suppository 650 mg  650 mg Rectal Q6H PRN Opyd, Ilene Qua, MD       ALPRAZolam Duanne Moron) tablet 0.25 mg  0.25 mg Oral TID PRN Terrilee Croak, MD   0.25 mg at 11/14/21 1257   amLODipine (NORVASC) tablet 5 mg  5 mg Oral Daily Opyd, Ilene Qua, MD   5 mg at 11/14/21 1018   aspirin chewable tablet 81 mg  81 mg Oral Daily Opyd, Ilene Qua, MD   81 mg at 11/14/21 1018   atorvastatin (LIPITOR) tablet 80 mg  80 mg Oral Daily Opyd, Ilene Qua, MD   80 mg at 11/14/21 1018   heparin ADULT infusion 100 units/mL (25000 units/240mL)  900 Units/hr Intravenous Continuous Erenest Blank, RPH 9 mL/hr at 11/14/21 1012 900 Units/hr at 11/14/21 1012   hydrALAZINE (APRESOLINE) tablet 25 mg  25 mg Oral Q8H PRN Opyd, Ilene Qua, MD       [  START ON 11/15/2021] methylPREDNISolone sodium succinate (SOLU-MEDROL) 125 mg/2 mL injection 125 mg  125 mg Intravenous Q24H Dahal, Binaya, MD       ondansetron (ZOFRAN) tablet 4 mg  4 mg Oral Q6H PRN Opyd, Ilene Qua, MD       Or   ondansetron (ZOFRAN) injection 4 mg  4 mg Intravenous Q6H PRN Opyd, Ilene Qua, MD       senna-docusate (Senokot-S) tablet 1 tablet  1 tablet Oral Daily Opyd, Ilene Qua, MD   1 tablet at 11/14/21 1019   sodium chloride flush (NS) 0.9 % injection 3 mL  3 mL Intravenous Q12H Opyd, Ilene Qua, MD   3 mL at 11/14/21 1019   ticagrelor (BRILINTA) tablet 90 mg  90 mg Oral BID Opyd, Ilene Qua, MD   90 mg at 11/14/21 1018    REVIEW OF SYSTEMS:   Constitutional: fatigue All other systems were reviewed with the  patient and are negative.  PHYSICAL EXAMINATION: ECOG PERFORMANCE STATUS: 2 - Symptomatic, <50% confined to bed  Vitals:   11/14/21 1457 11/14/21 1602  BP: 137/74 137/74  Pulse: 76 76  Resp: 15 15  Temp: 98.2 F (36.8 C) 98.2 F (36.8 C)  SpO2: 94%    There were no vitals filed for this visit.    LABORATORY DATA:  I have reviewed the data as listed Lab Results  Component Value Date   WBC 28.3 (H) 11/14/2021   HGB 8.0 (L) 11/14/2021   HCT 23.2 (L) 11/14/2021   MCV 100.9 (H) 11/14/2021   PLT 282 11/14/2021   Lab Results  Component Value Date   NA 135 11/14/2021   K 3.0 (L) 11/14/2021   CL 101 11/14/2021   CO2 24 11/14/2021    RADIOGRAPHIC STUDIES: I have personally reviewed the radiological reports and agreed with the findings in the report.  ASSESSMENT AND PLAN:  Auto-immune hemolytic anemia: Currently on solumedrol and Hb has improved from yesterday. I discussed with the patient that we can watch and wait for another day to see if it improves on its own with steroids. If tomorrows Hb is less than 7, then we can consider Rituxan therapy. Daily LDH and Retics All questions were answered. The patient knows to call the clinic with any problems, questions or concerns.    Harriette Ohara, MD @T @   ADDENDUM LDH 1143 (up from 734) I ordered Rituxan for tomorrow 11/15/21 Dr.Sherill to see patient

## 2021-11-14 NOTE — Plan of Care (Signed)
°  Problem: Clinical Measurements: Goal: Ability to maintain clinical measurements within normal limits will improve Outcome: Progressing Goal: Will remain free from infection Outcome: Progressing Goal: Diagnostic test results will improve Outcome: Progressing Goal: Respiratory complications will improve Outcome: Progressing Goal: Cardiovascular complication will be avoided Outcome: Progressing   Problem: Education: Goal: Knowledge of General Education information will improve Description: Including pain rating scale, medication(s)/side effects and non-pharmacologic comfort measures Outcome: Progressing   Problem: Coping: Goal: Level of anxiety will decrease Outcome: Progressing   Problem: Safety: Goal: Ability to remain free from injury will improve Outcome: Progressing

## 2021-11-14 NOTE — H&P (Addendum)
History and Physical    Andrea Williamson NUU:[id number] DOB: [birth year] DOA: 11/13/2021  PCP: Pcp, No   Patient coming from: Home   Chief Complaint: Weak, dizzy, nausea, vomiting  HPI: Andrea Williamson is a pleasant 70 y.o. 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  She was also noted in the emergency department to have a total bilirubin of 6.1, mild renal insufficiency, LDH 734, fibrinogen 510, and INR 1.4.    [facility] Hospital Course: She was admitted to the hospital, hematology-oncology Dr. 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  Dr. Alvy Bimler of oncology agreed with transfer to [facility].  While at [facility], the patient was also found to have bilateral lower extremity DVT and was started on anticoagulation.  She also had a CT chest/abdomen/pelvis that was concerning for a right adnexal mass.  Review of Systems:  All other systems reviewed and apart from HPI, are negative.  Past Medical History:  Diagnosis Date   Hypertension    Stroke [facility])     Past Surgical History:  Procedure Laterality Date   ANKLE SURGERY Left 2008   IR CT HEAD LTD  10/05/2021   IR INTRA CRAN STENT  10/01/2021   IR PERCUTANEOUS ART THROMBECTOMY/INFUSION INTRACRANIAL INC DIAG ANGIO  10/01/2021   IR US GUIDE VASC ACCESS RIGHT  10/01/2021   RADIOLOGY WITH ANESTHESIA N/A 10/01/2021   Procedure: IR WITH ANESTHESIA;  Surgeon: Radiologist, Medication, MD;  Location: [place];  Service: Radiology;  Laterality: N/A;    Social History:   reports that she has never smoked. She  has never used smokeless tobacco. She reports that she does not currently use alcohol. She reports that she does not currently use drugs.  No Known Allergies  History reviewed. No pertinent family history.   Prior to Admission medications   Medication Sig Start Date End Date Taking? Authorizing Provider  acetaminophen (TYLENOL) 325 MG tablet Take 1-2 tablets (325-650 mg total) by mouth every 4 (four) hours as needed for mild pain. 10/16/21   Love, Ivan Anchors, PA-C  amLODipine (NORVASC) 5 MG tablet Take 1 tablet (5 mg total) by mouth daily. 10/16/21   Love, Ivan Anchors, PA-C  aspirin 81 MG chewable tablet Chew 1 tablet (81 mg total) by mouth daily. 10/16/21   Love, Ivan Anchors, PA-C  atorvastatin (LIPITOR) 80 MG tablet Take 1 tablet (80 mg total) by mouth daily. 10/16/21   Love, Ivan Anchors, PA-C  enoxaparin (LOVENOX) 80 MG/0.8ML injection Inject 0.675 mLs (67.5 mg total) into the skin every 12 (twelve) hours. 11/13/21   [place] Donnamarie Poag, DO  hydrALAZINE (APRESOLINE) 25 MG tablet Take 1 tablet (25 mg total) by mouth every 8 (eight) hours as needed (SBP >170 or DBP >110). 11/13/21   [place] Donnamarie Poag, DO  magnesium gluconate (MAGONATE) 500 MG tablet Take 0.5 tablets (250 mg total) by mouth at bedtime. 10/16/21   Love, Ivan Anchors, PA-C  methylPREDNISolone sodium succinate (SOLU-MEDROL) 125 mg/2 mL injection Inject 2 mLs (125 mg total) into the vein every 12 (twelve) hours. 11/14/21   [place] Donnamarie Poag, DO  nicotine (NICODERM CQ -  DOSED IN MG/24 HOURS) 21 mg/24hr patch Place 1 patch (21 mg total) onto the skin daily. 10/16/21   Love, Ivan Anchors, PA-C  ondansetron (ZOFRAN-ODT) 4 MG disintegrating tablet Take 1 tablet (4 mg total) by mouth once as needed for nausea. 11/13/21   [place] Eric J, DO  senna-docusate (SENOKOT-S) 8.6-50 MG tablet Take 1 tablet by mouth daily at 6 (six) AM. 10/16/21   Love, Ivan Anchors, PA-C  ticagrelor (BRILINTA) 90 MG TABS tablet Take 1 tablet (90 mg total) by mouth 2 (two) times daily. 10/16/21   Bary Leriche, PA-C    Physical Exam: Vitals:   11/13/21 2328 11/13/21 2329 11/14/21 0002 11/14/21 0100  BP: (!) 176/82 (!) 176/82    Pulse: 72 67 65 65  Resp: 16 15 15 16   Temp: 98.2 F (36.8 C)     TempSrc: Oral     SpO2: 99% 97% 96% 98%    Constitutional: NAD, calm  Eyes: PERTLA, lids and conjunctivae normal ENMT: Mucous membranes are moist. Posterior pharynx clear of any exudate or lesions.   Neck: supple, no masses  Respiratory: no wheezing, no crackles. No accessory muscle use.  Cardiovascular: S1 & S2 heard, regular rate and rhythm. No extremity edema.  Abdomen: No distension, no tenderness, soft. Bowel sounds active.  Musculoskeletal: no clubbing / cyanosis. No joint deformity upper and lower extremities.   Skin: no significant rashes, lesions, ulcers. Jaundice. Warm, dry, well-perfused. Neurologic: CN 2-12 grossly intact. Moving all extremities. Alert and oriented.  Psychiatric: Pleasant. Cooperative.    Labs and Imaging on Admission: I have personally reviewed following labs and imaging studies  CBC: Recent Labs  Lab 11/12/21 2142 11/13/21 0019 11/14/21 0154  WBC 22.0* 21.4* PENDING  NEUTROABS  --  18.1*  --   HGB 4.5* 5.0* 8.0*  HCT 13.4* 15.1* 23.2*  MCV 111.7* 111.0* 100.9*  PLT 352 348 789   Basic Metabolic Panel: Recent Labs  Lab 11/12/21 2142  NA 135  K 3.4*  CL 103  CO2 18*  GLUCOSE 265*  BUN 38*  CREATININE 1.06*  CALCIUM 9.6   GFR: Estimated Creatinine Clearance: 45.7 mL/min (A) (by C-G formula based on SCr of 1.06 mg/dL (H)). Liver Function Tests: Recent Labs  Lab 11/12/21 2142 11/12/21 2338  AST 34 28  ALT 20 16  ALKPHOS 96 69  BILITOT 6.1* 4.7*  PROT 7.4 5.3*  ALBUMIN 3.8 2.9*   Recent Labs  Lab 11/12/21 2142  LIPASE 37   No results for input(s): AMMONIA in the last 168 hours. Coagulation Profile: Recent Labs  Lab 11/12/21 2338  INR 1.4*   Cardiac Enzymes: No results for input(s): CKTOTAL, CKMB, CKMBINDEX, TROPONINI  in the last 168 hours. BNP (last 3 results) No results for input(s): PROBNP in the last 8760 hours. HbA1C: Recent Labs    11/13/21 1508  HGBA1C 4.5*   CBG: No results for input(s): GLUCAP in the last 168 hours. Lipid Profile: No results for input(s): CHOL, HDL, LDLCALC, TRIG, CHOLHDL, LDLDIRECT in the last 72 hours. Thyroid Function Tests: No results for input(s): TSH, T4TOTAL, FREET4, T3FREE, THYROIDAB in the last 72 hours. Anemia Panel: Recent Labs    11/12/21 2250 11/12/21 2338 11/13/21 1508  VITAMINB12  --  389  --   FOLATE 10.6  --   --   RETICCTPCT  --   --  21.2*   Urine analysis:    Component Value Date/Time   COLORURINE AMBER (A) 11/12/2021 2142   APPEARANCEUR HAZY (  A) 11/12/2021 2142   LABSPEC 1.013 11/12/2021 2142   PHURINE 6.0 11/12/2021 2142   GLUCOSEU NEGATIVE 11/12/2021 2142   HGBUR LARGE (A) 11/12/2021 2142   BILIRUBINUR NEGATIVE 11/12/2021 2142   [place] 11/12/2021 2142   PROTEINUR 100 (A) 11/12/2021 2142   NITRITE NEGATIVE 11/12/2021 2142   LEUKOCYTESUR TRACE (A) 11/12/2021 2142   Sepsis Labs: @LABRCNTIP (procalcitonin:4,lacticidven:4) ) Recent Results (from the past 240 hour(s))  Resp Panel by RT-PCR (Flu A&B, Covid) Nasopharyngeal Swab     Status: None   Collection Time: 11/12/21  9:42 PM   Specimen: Nasopharyngeal Swab; Nasopharyngeal(NP) swabs in vial transport medium  Result Value Ref Range Status   SARS Coronavirus 2 by RT PCR NEGATIVE NEGATIVE Final    Comment: (NOTE) SARS-CoV-2 target nucleic acids are NOT DETECTED.  The SARS-CoV-2 RNA is generally detectable in upper respiratory specimens during the acute phase of infection. The lowest concentration of SARS-CoV-2 viral copies this assay can detect is 138 copies/mL. A negative result does not preclude SARS-Cov-2 infection and should not be used as the sole basis for treatment or other patient management decisions. A negative result may occur with  improper specimen  collection/handling, submission of specimen other than nasopharyngeal swab, presence of viral mutation(s) within the areas targeted by this assay, and inadequate number of viral copies(<138 copies/mL). A negative result must be combined with clinical observations, patient history, and epidemiological information. The expected result is Negative.  Fact Sheet for Patients:  EntrepreneurPulse.com.au  Fact Sheet for Healthcare Providers:  IncredibleEmployment.be  This test is no t yet approved or cleared by the [place] FDA and  has been authorized for detection and/or diagnosis of SARS-CoV-2 by FDA under an Emergency Use Authorization (EUA). This EUA will remain  in effect (meaning this test can be used) for the duration of the COVID-19 declaration under Section 564(b)(1) of the Act, 21 U.S.C.section 360bbb-3(b)(1), unless the authorization is terminated  or revoked sooner.       Influenza A by PCR NEGATIVE NEGATIVE Final   Influenza B by PCR NEGATIVE NEGATIVE Final    Comment: (NOTE) The Xpert Xpress SARS-CoV-2/FLU/RSV plus assay is intended as an aid in the diagnosis of influenza from Nasopharyngeal swab specimens and should not be used as a sole basis for treatment. Nasal washings and aspirates are unacceptable for Xpert Xpress SARS-CoV-2/FLU/RSV testing.  Fact Sheet for Patients: EntrepreneurPulse.com.au  Fact Sheet for Healthcare Providers: IncredibleEmployment.be  This test is not yet approved or cleared by the [place] FDA and has been authorized for detection and/or diagnosis of SARS-CoV-2 by FDA under an Emergency Use Authorization (EUA). This EUA will remain in effect (meaning this test can be used) for the duration of the COVID-19 declaration under Section 564(b)(1) of the Act, 21 U.S.C. section 360bbb-3(b)(1), unless the authorization is terminated or revoked.  Performed at [facility], [address] [place], [place] [address]      Radiological Exams on Admission: CT Head Wo Contrast  Result Date: 11/12/2021 CLINICAL DATA:  Dizziness nausea vomiting EXAM: CT HEAD WITHOUT CONTRAST TECHNIQUE: Contiguous axial images were obtained from the base of the skull through the vertex without intravenous contrast. COMPARISON:  CT brain 10/01/2021 FINDINGS: Brain: No acute territorial infarction, hemorrhage, or intracranial mass. Scattered encephalomalacia within the left frontal lobe and white matter corresponding to infarcts on previous MRI. Mild atrophy. Chronic small vessel ischemic changes of the white matter. Vague hypodensity within the left cerebellum corresponding to small infarct on prior MRI. Vascular:  Left A2 stent as before.  Carotid vascular calcifications. Skull: Normal. Negative for fracture or focal lesion. Sinuses/Orbits: No acute finding. Other: None IMPRESSION: 1. No CT evidence for acute intracranial abnormality. Atrophy and chronic small vessel ischemic changes of the white matter 2. Multiple small chronic appearing left ACA infarcts as noted on previous imaging. Electronically Signed   By: Donavan Foil M.D.   On: 11/12/2021 22:40   CT CHEST ABDOMEN PELVIS W CONTRAST  Result Date: 11/13/2021 CLINICAL DATA:  Occult malignancy.  Nausea. EXAM: CT CHEST, ABDOMEN, AND PELVIS WITH CONTRAST TECHNIQUE: Multidetector CT imaging of the chest, abdomen and pelvis was performed following the standard protocol during bolus administration of intravenous contrast. CONTRAST:  153mL OMNIPAQUE IOHEXOL 300 MG/ML  SOLN COMPARISON:  None. FINDINGS: CT CHEST FINDINGS Cardiovascular: Heart size is normal. Coronary artery calcifications are present. The appearance of the pulmonary arteries is unremarkable accounting for the contrast bolus timing. There is atherosclerotic calcification of the thoracic aorta, not associated with aneurysm. Mediastinum/Nodes: The visualized portion of  the thyroid gland has a normal appearance. There is mild thickening of the LOWER esophageal wall, not associated with discrete mass. No significant mediastinal, hilar, or axillary adenopathy. Lungs/Pleura: Minimal LEFT apical pleuroparenchymal changes. No pulmonary nodules. No consolidations or pleural effusions. Musculoskeletal: Mild midthoracic degenerative changes. CT ABDOMEN PELVIS FINDINGS Hepatobiliary: Mildly prominent RIGHT hepatic lobe, variation of normal. No focal hepatic lesions. Normal parenchymal density. Gallbladder is present. Pancreas: Unremarkable. No pancreatic ductal dilatation or surrounding inflammatory changes. Spleen: Normal in size without focal abnormality. Adrenals/Urinary Tract: Adrenal glands are normal in appearance. A 1 centimeter benign cyst is identified in the UPPER pole of the RIGHT kidney. LEFT kidney is unremarkable. Ureters are unremarkable. The bladder and visualized portion of the urethra are normal. Stomach/Bowel: Mild thickening of the LOWER esophageal wall. Stomach is otherwise unremarkable. Small bowel loops are normal in appearance. Moderate stool burden identified within the rectosigmoid colon. Large bowel is otherwise unremarkable. The appendix is well seen and normal in appearance. Vascular/Lymphatic: There is mild, nonspecific stranding within the mesentery in the RIGHT UPPER QUADRANT. Small lymph nodes are identified within this region, largest measuring 7 millimeters in short axis, image 76 of series 2. No retroperitoneal adenopathy. There is atherosclerotic calcification of the abdominal aorta, not associated with aneurysm. There is normal vascular opacification of the celiac axis, superior mesenteric artery, and inferior mesenteric artery. Normal appearance of the portal venous system and inferior vena cava. Reproductive: The uterus is present. A central lobulated enhancing mass likely represents fibroid, measuring 3.0 centimeters on image 105 of series 2. The  RIGHT adnexal region is notable for a solid and cystic mass measuring 4.9 x 3.4 centimeters on image 102 of series 2. LEFT adnexal region is unremarkable. No ascites. Other: Anterior abdominal wall is notable for fat containing paraumbilical hernia. Musculoskeletal: No acute or significant osseous findings. IMPRESSION: 1. No acute abnormality of the chest. 2. Solid and cystic mass within the RIGHT adnexa, likely representing ovarian lesion measuring 4.9 centimeters. Recommend further characterization with pelvic ultrasound. 3. Nonspecific mesenteric stranding in the RIGHT UPPER QUADRANT, associated subcentimeter lymph nodes. Consider follow-up in 3-6 months to assess for stability. 4.  Aortic atherosclerosis.  (ICD10-I70.0) 5. RIGHT renal cyst. 6. Uterine fibroid. 7. Mild thickening of the LOWER esophagus, raising question of esophagitis. Electronically Signed   By: Nolon Nations M.D.   On: 11/13/2021 08:23   US Venous Img Lower Bilateral (DVT)  Result Date: 11/13/2021 CLINICAL DATA:  Hemolytic anemia EXAM:  BILATERAL LOWER EXTREMITY VENOUS DOPPLER ULTRASOUND TECHNIQUE: Gray-scale sonography with compression, as well as color and duplex ultrasound, were performed to evaluate the deep venous system(s) from the level of the common femoral vein through the popliteal and proximal calf veins. COMPARISON:  None. FINDINGS: VENOUS Normal compressibility, phasicity, and augmentation of the common femoral, superficial femoral, and popliteal veins bilaterally. However, there is occlusive thrombus in the right posterior tibial vein, and occlusive thrombus in the left peroneal vein. OTHER None. Limitations: none IMPRESSION: 1. Today's exam is positive for bilateral below knee deep vein thrombosis (BKDVT), with occlusive thrombus in the right posterior tibial vein and in the left peroneal vein. Electronically Signed   By: Van Clines M.D.   On: 11/13/2021 09:19   US PELVIC COMPLETE WITH TRANSVAGINAL  Result  Date: 11/13/2021 CLINICAL DATA:  Right adnexal mass seen in the CT done earlier today EXAM: TRANSABDOMINAL AND TRANSVAGINAL ULTRASOUND OF PELVIS DOPPLER ULTRASOUND OF OVARIES TECHNIQUE: Both transabdominal and transvaginal ultrasound examinations of the pelvis were performed. Transabdominal technique was performed for global imaging of the pelvis including uterus, ovaries, adnexal regions, and pelvic cul-de-sac. It was necessary to proceed with endovaginal exam following the transabdominal exam to visualize the ovaries. Color and duplex Doppler ultrasound was utilized to evaluate blood flow to the ovaries. COMPARISON:  CT done earlier today FINDINGS: Uterus Measurements: 5.9 x 2.4 x 5 cm = volume: 37.2 mL. There is 4 x 2.7 x 2.7 cm fibroid in the left side of body. Endometrium Thickness: 2.9 mm.  No focal abnormality visualized. Right ovary Measurements: 3.9 x 2.9 x 3.4 cm = volume: 20.15 mL. Is a complex right adnexal lesion. Lesion is mostly cystic with a lobulated solid component in the margin. There is demonstrable vascular flow in the solid component. Cystic portion of the lesion measures 3.9 x 2.6 x 3.3 cm. Solid component in the margin measures 1.4 x 0.8 x 1.2 cm. Left ovary Not sonographically visualized. Pulsed Doppler evaluation of right ovary demonstrates normal low-resistance arterial and venous waveforms. Other findings No abnormal free fluid. IMPRESSION: There is a complex lesion in the right adnexa with mostly cystic echogenicity along with a lobulated solid nodule measuring 1.4 cm in size with demonstrable internal vascularity. Possibility of malignant neoplastic process in the right ovary is not excluded. 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 Left ovary is not sonographically visualized. Electronically Signed   By: Elmer Picker M.D.   On: 11/13/2021 12:26    EKG: Independently reviewed. Sinus rhythm.   Assessment/Plan  1. Hemolytic anemia  Patient  presenting to the ED with 1 week history of dizziness, generalized weakness.  Was found to have a hemoglobin of 4.5 on admission.  Fibrinogen elevated at 510, LDH 734.  B12/folate within normal limits.  Denies any blood in stool or hematemesis.  Complicated by recent initiation of dual antiplatelet therapy with Brilinta/aspirin from recent stent placement following CVA in November 2022.  Seen by medical oncology, Dr. 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 x1 12/30, starting Solu-Medrol 125 mg IV q12 12/31 --Transfer to [facility] hospital for initiation of Rituxan infusion  2. Bilateral lower extremity DVT  Vascular duplex ultrasound bilateral lower extremities positive for DVT right posterior tibial vein and left peroneal vein  - Pharmacy at [facility] consulted for assistance with anticoagulation and recommended IV heparin    3. Right adnexal mass  CT chest/abdomen/pelvis notable for a solid and cystic mass within the right adnexa likely representing an ovarian lesion measuring 4.9 cm.  Pelvic ultrasound with complex lesion right adnexa with mostly cystic echogenicity along with a lobulated solid nodule measuring 1.4 cm with demonstratable internal vascularity; possibility of a malignant neoplastic process right ovary not excluded  - CA125 ordered - Will need GYN evaluation   4. Hx of recent CVA s/p thrombectomy and stent  Left ACA CVA d/t A2 occlusion s/p thrombectomy and stent on 10/01/21  - Hospitalist at [facility] discussed with Dr. Karenann Cai on 12/30, would typically continue DAPT for 6 months, can shorten to 3 mos when necessity  arises, but only will discontinue one of the agents before 3 months in very critical cases; her recommendation is to continue at this time; given if these agents are discontinued she is high risk for recurrent stroke. --Continue Brilinta and aspirin  5. Hypertension  - Continue Norvasc   6. Hyperlipidemia  - Continue atorvastatin    DVT prophylaxis: IV heparin  Code Status:  Full  Level of Care: Level of care:  Family Communication: None present  Disposition Plan:  Patient is from: Home  Anticipated d/c is to: TBD Anticipated d/c date is: 11/17/21  Patient currently: pending hematology consultation  Consults called: hematology-oncology  Admission status: Inpatient     Vianne Bulls, MD [facility]  11/14/2021, 2:55 AM

## 2021-11-14 NOTE — Progress Notes (Signed)
Report given to [facility].

## 2021-11-14 NOTE — Progress Notes (Signed)
ANTICOAGULATION CONSULT NOTE - Follow Up Consult  Pharmacy Consult for heparin  Indication: acute DVT  No Known Allergies  Patient Measurements: Height: 5\' 3"  (160 cm) IBW/kg (Calculated) : 52.4 Heparin Dosing Weight: 66 kg  Vital Signs: Temp: 98.2 F (36.8 C) (12/31 1602) Temp Source: Oral (12/31 1602) BP: 137/74 (12/31 1602) Pulse Rate: 76 (12/31 1602)  Labs: Recent Labs    11/12/21 2142 11/12/21 2338 11/13/21 0019 11/14/21 0154  HGB 4.5*  --  5.0* 8.0*  HCT 13.4*  --  15.1* 23.2*  PLT 352  --  348 282  APTT  --   --  27  --   LABPROT  --  16.7*  --   --   INR  --  1.4*  --   --   CREATININE 1.06*  --   --  1.10*  TROPONINIHS 16  --   --   --     Estimated Creatinine Clearance: 44 mL/min (A) (by C-G formula based on SCr of 1.1 mg/dL (H)).   Assessment: Patient is a 70 y.o F with hx CVA in Nov 2022 (s/p thrombectomy and stent placement) and was placed on Brilinta and asa after the procedure. She presented to the ED on 11/12/21 with c/o n/v and dizziness and was found to have a hgb of 4.5.  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  LE doppler on 12/30 came back with bilateral DVT.  She was initially started on LMWH, but this was changed to heparin drip on 12/31.  Today, 11/14/2021: - first heparin level is therapeutic at 0.46 with drip infusing at 900 units.hr - hgb up 8, plts 282K.  - no bleeding documented  Goal of Therapy:  Heparin level 0.3-0.7 units/ml Monitor platelets by anticoagulation protocol: Yes   Plan:  - continue heparin drip at 900 units/hr - daily heparin level and cbc - monitor for s/sx bleeding  Lynelle Doctor 11/14/2021,7:03 PM

## 2021-11-14 NOTE — Progress Notes (Signed)
Methyprednisone 125mg /2 ml injection. Give at [phone]. Unable to scan med in [facility].

## 2021-11-14 NOTE — Plan of Care (Signed)
Neurology Plan of Care  Neurology was asked to weigh in on antiplatelet therapy while patient is on heparin gtt per primary team due to BLE DVT and hemolytic anemia. Patient was on DAPT with aspirin and Brilinta therapy s/p left ACA CVA due to A2 occlusion with thrombectomy and stent on 10/01/2021. Dr. Pietro Cassis wanted recommendations regarding antiplatelet medications during current hospitalization. Discussed with attending neurologist, Dr. Theda Sers. Okay to discontinue ASA/Brilinta while on heparin gtt. Primary team plans to discharge patient on full anticoagulation and requested recommendations regarding antiplatelet medication at discharge. Full dose anticoagulation is sufficient for stroke prophylaxis without further antiplatelet medications. It is up to the primary team and the longitudinal provider to monitor patient's anticoagulation. Follow up with outpatient neurology as previously recommended.   Andrea Williamson, AGACNP-BC [facility] [phone]

## 2021-11-15 DIAGNOSIS — D5919 Other autoimmune hemolytic anemia: Secondary | ICD-10-CM | POA: Diagnosis not present

## 2021-11-15 LAB — COMPREHENSIVE METABOLIC PANEL
ALT: 18 U/L (ref 0–44)
AST: 33 U/L (ref 15–41)
Albumin: 3.8 g/dL (ref 3.5–5.0)
Alkaline Phosphatase: 88 U/L (ref 38–126)
Anion gap: 9 (ref 5–15)
BUN: 43 mg/dL — ABNORMAL HIGH (ref 8–23)
CO2: 23 mmol/L (ref 22–32)
Calcium: 9.3 mg/dL (ref 8.9–10.3)
Chloride: 105 mmol/L (ref 98–111)
Creatinine, Ser: 0.91 mg/dL (ref 0.44–1.00)
GFR, Estimated: >60 mL/min (ref >60)
Glucose, Bld: 101 mg/dL — ABNORMAL HIGH (ref 70–99)
Potassium: 3.3 mmol/L — ABNORMAL LOW (ref 3.5–5.1)
Sodium: 137 mmol/L (ref 135–145)
Total Bilirubin: 2.8 mg/dL — ABNORMAL HIGH (ref 0.3–1.2)
Total Protein: 7 g/dL (ref 6.5–8.1)

## 2021-11-15 LAB — CBC
HCT: 23.8 % — ABNORMAL LOW (ref 36.0–46.0)
Hemoglobin: 7.9 g/dL — ABNORMAL LOW (ref 12.0–15.0)
MCH: 36.1 pg — ABNORMAL HIGH (ref 26.0–34.0)
MCHC: 33.2 g/dL (ref 30.0–36.0)
MCV: 108.7 fL — ABNORMAL HIGH (ref 80.0–100.0)
Platelets: 267 10*3/uL (ref 150–400)
RBC: 2.19 MIL/uL — ABNORMAL LOW (ref 3.87–5.11)
RDW: 29.2 % — ABNORMAL HIGH (ref 11.5–15.5)
WBC: 25.9 10*3/uL — ABNORMAL HIGH (ref 4.0–10.5)
nRBC: 26.4 % — ABNORMAL HIGH (ref 0.0–0.2)

## 2021-11-15 LAB — GLUCOSE 6 PHOSPHATE DEHYDROGENASE
G6PDH: 14.2 U/g{Hb} (ref 4.8–15.7)
Hemoglobin: 8.7 g/dL — ABNORMAL LOW (ref 11.1–15.9)

## 2021-11-15 LAB — RETICULOCYTES
Immature Retic Fract: 51.4 % — ABNORMAL HIGH (ref 2.3–15.9)
RBC.: 2.17 MIL/uL — ABNORMAL LOW (ref 3.87–5.11)
Retic Count, Absolute: 385.5 10*3/uL — ABNORMAL HIGH (ref 19.0–186.0)
Retic Ct Pct: 17.1 % — ABNORMAL HIGH (ref 0.4–3.1)

## 2021-11-15 LAB — SAVE SMEAR(SSMR), FOR PROVIDER SLIDE REVIEW

## 2021-11-15 LAB — HAPTOGLOBIN: Haptoglobin: <10 mg/dL — ABNORMAL LOW (ref 37–355)

## 2021-11-15 LAB — LACTATE DEHYDROGENASE: LDH: 1047 U/L — ABNORMAL HIGH (ref 98–192)

## 2021-11-15 LAB — HEPARIN LEVEL (UNFRACTIONATED): Heparin Unfractionated: 0.55 IU/mL (ref 0.30–0.70)

## 2021-11-15 MED ORDER — ALPRAZOLAM 0.5 MG PO TABS: 0.5000 mg | ORAL_TABLET | Freq: Three times a day (TID) | ORAL | Status: DC | PRN

## 2021-11-15 MED ORDER — ZOLPIDEM TARTRATE 5 MG PO TABS
5.0000 mg | ORAL_TABLET | Freq: Every evening | ORAL | Status: DC | PRN
  Administered 2021-11-15 – 2021-11-19 (×5): 5 mg via ORAL
  Filled 2021-11-15 (×5): qty 1

## 2021-11-15 NOTE — Progress Notes (Addendum)
IP PROGRESS NOTE  Subjective:   Andrea Williamson reports feeling better since hospital admission.  She reports she was making progress with poststroke rehabilitation prior to developing severe anemia.  Objective: Vital signs in last 24 hours: Blood pressure 128/85, pulse 61, temperature 98.1 F (36.7 C), temperature source Oral, resp. rate 18, height 5\' 3"  (1.6 m), SpO2 100 %.  Intake/Output from previous day: 12/31 0701 - 01/01 0700 In: 314.2 [P.O.:240; I.V.:74.2] Out: -   Physical Exam:  HEENT: Scleral icterus, no thrush Lungs: Clear bilaterally, no respiratory distress Cardiac: Regular rate and rhythm Abdomen: No hepatosplenomegaly Extremities: No leg edema Lymph nodes: No cervical, supraclavicular, or axillary nodes`   Lab Results: Recent Labs    11/14/21 0154 11/15/21 0552  WBC 28.3* 25.9*  HGB 8.0* 7.9*  HCT 23.2* 23.8*  PLT 282 267   Review of peripheral blood smear from 11/15/2021: The platelets appear normal in number, the majority of the white cells are mature neutrophils, few myelocytes and metamyelocytes, no blasts.  The polychromasia is markedly increased.  Numerous nucleated red cells, few earlier Red cell precursors.  Spherocytes. BMET Recent Labs    11/14/21 0154 11/15/21 0552  NA 135 137  K 3.0* 3.3*  CL 101 105  CO2 24 23  GLUCOSE 104* 101*  BUN 32* 43*  CREATININE 1.10* 0.91  CALCIUM 9.3 9.3    No results found for: CEA1, CEA, [id number], CA125  Studies/Results: CT CHEST ABDOMEN PELVIS W CONTRAST  Result Date: 11/13/2021 CLINICAL DATA:  Occult malignancy.  Nausea. EXAM: CT CHEST, ABDOMEN, AND PELVIS WITH CONTRAST TECHNIQUE: Multidetector CT imaging of the chest, abdomen and pelvis was performed following the standard protocol during bolus administration of intravenous contrast. CONTRAST:  179mL OMNIPAQUE IOHEXOL 300 MG/ML  SOLN COMPARISON:  None. FINDINGS: CT CHEST FINDINGS Cardiovascular: Heart size is normal. Coronary artery calcifications are present.  The appearance of the pulmonary arteries is unremarkable accounting for the contrast bolus timing. There is atherosclerotic calcification of the thoracic aorta, not associated with aneurysm. Mediastinum/Nodes: The visualized portion of the thyroid gland has a normal appearance. There is mild thickening of the LOWER esophageal wall, not associated with discrete mass. No significant mediastinal, hilar, or axillary adenopathy. Lungs/Pleura: Minimal LEFT apical pleuroparenchymal changes. No pulmonary nodules. No consolidations or pleural effusions. Musculoskeletal: Mild midthoracic degenerative changes. CT ABDOMEN PELVIS FINDINGS Hepatobiliary: Mildly prominent RIGHT hepatic lobe, variation of normal. No focal hepatic lesions. Normal parenchymal density. Gallbladder is present. Pancreas: Unremarkable. No pancreatic ductal dilatation or surrounding inflammatory changes. Spleen: Normal in size without focal abnormality. Adrenals/Urinary Tract: Adrenal glands are normal in appearance. A 1 centimeter benign cyst is identified in the UPPER pole of the RIGHT kidney. LEFT kidney is unremarkable. Ureters are unremarkable. The bladder and visualized portion of the urethra are normal. Stomach/Bowel: Mild thickening of the LOWER esophageal wall. Stomach is otherwise unremarkable. Small bowel loops are normal in appearance. Moderate stool burden identified within the rectosigmoid colon. Large bowel is otherwise unremarkable. The appendix is well seen and normal in appearance. Vascular/Lymphatic: There is mild, nonspecific stranding within the mesentery in the RIGHT UPPER QUADRANT. Small lymph nodes are identified within this region, largest measuring 7 millimeters in short axis, image 76 of series 2. No retroperitoneal adenopathy. There is atherosclerotic calcification of the abdominal aorta, not associated with aneurysm. There is normal vascular opacification of the celiac axis, superior mesenteric artery, and inferior mesenteric  artery. Normal appearance of the portal venous system and inferior vena cava. Reproductive: The uterus is  present. A central lobulated enhancing mass likely represents fibroid, measuring 3.0 centimeters on image 105 of series 2. The RIGHT adnexal region is notable for a solid and cystic mass measuring 4.9 x 3.4 centimeters on image 102 of series 2. LEFT adnexal region is unremarkable. No ascites. Other: Anterior abdominal wall is notable for fat containing paraumbilical hernia. Musculoskeletal: No acute or significant osseous findings. IMPRESSION: 1. No acute abnormality of the chest. 2. Solid and cystic mass within the RIGHT adnexa, likely representing ovarian lesion measuring 4.9 centimeters. Recommend further characterization with pelvic ultrasound. 3. Nonspecific mesenteric stranding in the RIGHT UPPER QUADRANT, associated subcentimeter lymph nodes. Consider follow-up in 3-6 months to assess for stability. 4.  Aortic atherosclerosis.  (ICD10-I70.0) 5. RIGHT renal cyst. 6. Uterine fibroid. 7. Mild thickening of the LOWER esophagus, raising question of esophagitis. Electronically Signed   By: Nolon Nations M.D.   On: 11/13/2021 08:23   US Venous Img Lower Bilateral (DVT)  Result Date: 11/13/2021 CLINICAL DATA:  Hemolytic anemia EXAM: BILATERAL LOWER EXTREMITY VENOUS DOPPLER ULTRASOUND TECHNIQUE: Gray-scale sonography with compression, as well as color and duplex ultrasound, were performed to evaluate the deep venous system(s) from the level of the common femoral vein through the popliteal and proximal calf veins. COMPARISON:  None. FINDINGS: VENOUS Normal compressibility, phasicity, and augmentation of the common femoral, superficial femoral, and popliteal veins bilaterally. However, there is occlusive thrombus in the right posterior tibial vein, and occlusive thrombus in the left peroneal vein. OTHER None. Limitations: none IMPRESSION: 1. Today's exam is positive for bilateral below knee deep vein  thrombosis (BKDVT), with occlusive thrombus in the right posterior tibial vein and in the left peroneal vein. Electronically Signed   By: Van Clines M.D.   On: 11/13/2021 09:19   US PELVIC COMPLETE WITH TRANSVAGINAL  Result Date: 11/13/2021 CLINICAL DATA:  Right adnexal mass seen in the CT done earlier today EXAM: TRANSABDOMINAL AND TRANSVAGINAL ULTRASOUND OF PELVIS DOPPLER ULTRASOUND OF OVARIES TECHNIQUE: Both transabdominal and transvaginal ultrasound examinations of the pelvis were performed. Transabdominal technique was performed for global imaging of the pelvis including uterus, ovaries, adnexal regions, and pelvic cul-de-sac. It was necessary to proceed with endovaginal exam following the transabdominal exam to visualize the ovaries. Color and duplex Doppler ultrasound was utilized to evaluate blood flow to the ovaries. COMPARISON:  CT done earlier today FINDINGS: Uterus Measurements: 5.9 x 2.4 x 5 cm = volume: 37.2 mL. There is 4 x 2.7 x 2.7 cm fibroid in the left side of body. Endometrium Thickness: 2.9 mm.  No focal abnormality visualized. Right ovary Measurements: 3.9 x 2.9 x 3.4 cm = volume: 20.15 mL. Is a complex right adnexal lesion. Lesion is mostly cystic with a lobulated solid component in the margin. There is demonstrable vascular flow in the solid component. Cystic portion of the lesion measures 3.9 x 2.6 x 3.3 cm. Solid component in the margin measures 1.4 x 0.8 x 1.2 cm. Left ovary Not sonographically visualized. Pulsed Doppler evaluation of right ovary demonstrates normal low-resistance arterial and venous waveforms. Other findings No abnormal free fluid. IMPRESSION: There is a complex lesion in the right adnexa with mostly cystic echogenicity along with a lobulated solid nodule measuring 1.4 cm in size with demonstrable internal vascularity. Possibility of malignant neoplastic process in the right ovary is not excluded. There is inhomogeneous echogenicity in myometrium with 4 cm  fibroid. Left ovary is not sonographically visualized. Electronically Signed   By: Elmer Picker M.D.   On: 11/13/2021 12:26  Medications: I have reviewed the patient's current medications.  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  She has been diagnosed with autoimmune hemolytic anemia.  No clear risk factor for autoimmune hemolysis, though she could have hemolytic anemia associated with a malignancy.  She has a right adnexal mass on CT.  Recent CVA and DVTs raise also raise concern for malignancy.  The hemoglobin is stable today.  The bilirubin is lower.  Recommendations: Continue Solu-Medrol, no indication for rituximab at present Check Ca125 GYN oncology consult Continue heparin for the DVTs Daily CBC, CMP, LDH    LOS: 2 days   Betsy Coder, MD   11/15/2021, 7:01 AM

## 2021-11-15 NOTE — Progress Notes (Addendum)
PROGRESS NOTE  Andrea Williamson MVH:[id number] DOB: 1951-06-02 DOA: 11/13/2021 PCP: Pcp, No   LOS: 2 days   Brief narrative: Andrea Williamson is a 70 y.o. female with past medical history of hypertension, hyperlipidemia, recent left ACA stroke status post left A2 thrombectomy and stent placement on 10/01/2021 presented to the hospital initially at [facility] with dizziness nausea and vomiting.  In the ED patient was noted to have hemoglobin of 4.5 which was a significant drop from hemoglobin of 13 previous months.  Bilirubin was elevated at 6.1 as well.  Hematology was consulted and hemolytic anemia was considered with LDH elevated at 734, patient was given 2 units of packed RBC and started on systemic steroids.  In view of possible need of further reduction patient was a transfer to [facility] long hospital.  She also had a CT scan of the abdomen and pelvis which showed a right adnexal mass around 1.4 cm suspicious for possible malignancy.  She was also noted to have bilateral lower extremity edema and ultrasound showed DVT.  Patient was then started on IV heparin drip as well.    Assessment/Plan:  Principal Problem:   Hemolytic anemia (HCC) Active Problems:   Stroke (cerebrum) (HCC)   Essential hypertension   DVT (deep venous thrombosis) (HCC)   Adnexal mass  Warm autoimmune hemolytic anemia  Patient presented with dizziness.  Initial hemoglobin low at 4.5.  Has improved with blood transfusion.  Currently receiving Solu-Medrol.  Oncology on board.  LDH has slightly gone down with stable hemoglobin.  Currently observation and conservative treatment going on.  HIV hepatitis B and C nonreactive.  PNH and flow cytometry pending.  Bilateral lower extremity DVT Currently on heparin drip.  Oncology on board.   Right adnexal mass  CT chest of the abdomen pelvis showing adnexal mass.  Pelvic ultrasound showing complex lesion with echogenicity around 1.4 cm with internal vascularity.  Possibility of malignancy.  CA125  is pending.  I spoke with Delsa Sale of GYN oncology who did not feel ovarian cancer but could be a tumor.  She recommended CEA and will follow-up as outpatient or inpatient if she stays here for few more days.    Hx of recent CVA s/p thrombectomy and stent  Left ACA CVA d/t A2 occlusion s/p thrombectomy and stent on 10/01/21.  Patient was on aspirin and Brilinta .  Was seen by neurology who recommended to hold aspirin and brillinta if the patient will be on anticoagulation with heparin.   Hypertension  Continue Norvasc.  Blood pressure seems to be stable at this time.  Hyperlipidemia  Continue Lipitor.   Anxiety On low-dose Xanax.    DVT prophylaxis:    On heparin drip.  Code Status: Full code  Family Communication:  I spoke with the patients' daughter on the phone and updated her about the clinical condition.   Status is: Inpatient  Remains inpatient appropriate because: Hemolytic anemia, right adnexal mass, on IV heparin drip, closer monitoring,   Consultants: Oncology GYN oncology  Procedures: PRBC transfusion.  Anti-infectives:  None  Anti-infectives (From admission, onward)    None      Subjective:  Today, patient was seen and examined at bedside.  Complains of mild dizziness but no chest pain, shortness of breath, fever, chills or rigor.  No nausea vomiting or diarrhea.  Objective: Vitals:   11/14/21 2218 11/15/21 0451  BP: 135/78 128/85  Pulse: 78 61  Resp: 18 18  Temp: 97.9 F (36.6 C) 98.1 F (  36.7 C)  SpO2: 98% 100%    Intake/Output Summary (Last 24 hours) at 11/15/2021 1256 Last data filed at 11/15/2021 0900 Gross per 24 hour  Intake 314.24 ml  Output --  Net 314.24 ml   There were no vitals filed for this visit. Body mass index is 26.57 kg/m.   Physical Exam:  GENERAL: Patient is alert awake and oriented. Not in obvious distress. HENT: Mild pallor noted.  Pupils equally reactive to light. Oral mucosa is moist NECK: is supple, no  gross swelling noted. CHEST: Clear to auscultation. No crackles or wheezes.  Diminished breath sounds bilaterally. CVS: S1 and S2 heard, no murmur. Regular rate and rhythm.  ABDOMEN: Soft, non-tender, bowel sounds are present. EXTREMITIES: No edema. CNS: Cranial nerves are intact. No focal motor deficits. anxious SKIN: warm and dry without rashes.  Data Review: I have personally reviewed the following laboratory data and studies,  CBC: Recent Labs  Lab 11/12/21 2142 11/13/21 0019 11/14/21 0154 11/15/21 0552  WBC 22.0* 21.4* 28.3* 25.9*  NEUTROABS  --  18.1*  --   --   HGB 4.5* 5.0* 8.0* 7.9*  HCT 13.4* 15.1* 23.2* 23.8*  MCV 111.7* 111.0* 100.9* 108.7*  PLT 352 348 282 277   Basic Metabolic Panel: Recent Labs  Lab 11/12/21 2142 11/14/21 0154 11/15/21 0552  NA 135 135 137  K 3.4* 3.0* 3.3*  CL 103 101 105  CO2 18* 24 23  GLUCOSE 265* 104* 101*  BUN 38* 32* 43*  CREATININE 1.06* 1.10* 0.91  CALCIUM 9.6 9.3 9.3  MG  --  2.3  --    Liver Function Tests: Recent Labs  Lab 11/12/21 2142 11/12/21 2338 11/14/21 0154 11/15/21 0552  AST 34 28 41 33  ALT 20 16 17 18   ALKPHOS 96 69 95 88  BILITOT 6.1* 4.7* 5.0* 2.8*  PROT 7.4 5.3* 6.9 7.0  ALBUMIN 3.8 2.9* 3.6 3.8   Recent Labs  Lab 11/12/21 2142  LIPASE 37   No results for input(s): AMMONIA in the last 168 hours. Cardiac Enzymes: No results for input(s): CKTOTAL, CKMB, CKMBINDEX, TROPONINI in the last 168 hours. BNP (last 3 results) No results for input(s): BNP in the last 8760 hours.  ProBNP (last 3 results) No results for input(s): PROBNP in the last 8760 hours.  CBG: No results for input(s): GLUCAP in the last 168 hours. Recent Results (from the past 240 hour(s))  Resp Panel by RT-PCR (Flu A&B, Covid) Nasopharyngeal Swab     Status: None   Collection Time: 11/12/21  9:42 PM   Specimen: Nasopharyngeal Swab; Nasopharyngeal(NP) swabs in vial transport medium  Result Value Ref Range Status   SARS  Coronavirus 2 by RT PCR NEGATIVE NEGATIVE Final    Comment: (NOTE) SARS-CoV-2 target nucleic acids are NOT DETECTED.  The SARS-CoV-2 RNA is generally detectable in upper respiratory specimens during the acute phase of infection. The lowest concentration of SARS-CoV-2 viral copies this assay can detect is 138 copies/mL. A negative result does not preclude SARS-Cov-2 infection and should not be used as the sole basis for treatment or other patient management decisions. A negative result may occur with  improper specimen collection/handling, submission of specimen other than nasopharyngeal swab, presence of viral mutation(s) within the areas targeted by this assay, and inadequate number of viral copies(<138 copies/mL). A negative result must be combined with clinical observations, patient history, and epidemiological information. The expected result is Negative.  Fact Sheet for Patients:  EntrepreneurPulse.com.au  Fact Sheet for  Healthcare Providers:  IncredibleEmployment.be  This test is no t yet approved or cleared by the [place] and  has been authorized for detection and/or diagnosis of SARS-CoV-2 by FDA under an Emergency Use Authorization (EUA). This EUA will remain  in effect (meaning this test can be used) for the duration of the COVID-19 declaration under Section 564(b)(1) of the Act, 21 U.S.C.section 360bbb-3(b)(1), unless the authorization is terminated  or revoked sooner.       Influenza A by PCR NEGATIVE NEGATIVE Final   Influenza B by PCR NEGATIVE NEGATIVE Final    Comment: (NOTE) The Xpert Xpress SARS-CoV-2/FLU/RSV plus assay is intended as an aid in the diagnosis of influenza from Nasopharyngeal swab specimens and should not be used as a sole basis for treatment. Nasal washings and aspirates are unacceptable for Xpert Xpress SARS-CoV-2/FLU/RSV testing.  Fact Sheet for  Patients: EntrepreneurPulse.com.au  Fact Sheet for Healthcare Providers: IncredibleEmployment.be  This test is not yet approved or cleared by the [place] FDA and has been authorized for detection and/or diagnosis of SARS-CoV-2 by FDA under an Emergency Use Authorization (EUA). This EUA will remain in effect (meaning this test can be used) for the duration of the COVID-19 declaration under Section 564(b)(1) of the Act, 21 U.S.C. section 360bbb-3(b)(1), unless the authorization is terminated or revoked.  Performed at [facility], [address], [place], [place] [address]      Studies: No results found.    Flora Lipps, MD  [facility] 11/15/2021  If 7PM-7AM, please contact night-coverage

## 2021-11-15 NOTE — Progress Notes (Signed)
ANTICOAGULATION CONSULT NOTE - Follow Up Consult  Pharmacy Consult for heparin  Indication: acute DVT  No Known Allergies  Patient Measurements: Height: 5\' 3"  (160 cm) IBW/kg (Calculated) : 52.4 Heparin Dosing Weight: 66 kg  Vital Signs: Temp: 98.1 F (36.7 C) (01/01 0451) Temp Source: Oral (01/01 0451) BP: 128/85 (01/01 0451) Pulse Rate: 61 (01/01 0451)  Labs: Recent Labs    11/12/21 2142 11/12/21 2338 11/13/21 0019 11/14/21 0154 11/14/21 1834 11/15/21 0552  HGB 4.5*  --  5.0* 8.0*  --  7.9*  HCT 13.4*  --  15.1* 23.2*  --  23.8*  PLT 352  --  348 282  --  267  APTT  --   --  27  --   --   --   LABPROT  --  16.7*  --   --   --   --   INR  --  1.4*  --   --   --   --   HEPARINUNFRC  --   --   --   --  0.46 0.55  CREATININE 1.06*  --   --  1.10*  --  0.91  TROPONINIHS 16  --   --   --   --   --      Estimated Creatinine Clearance: 53.2 mL/min (by C-G formula based on SCr of 0.91 mg/dL).   Assessment: Patient is a 71 y.o F with hx CVA in Nov 2022 (s/p thrombectomy and stent placement) and was placed on Brilinta and asa after the procedure. She presented to the ED on 11/12/21 with c/o n/v and dizziness and was found to have a hgb of 4.5.  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  LE doppler on 12/30 came back with bilateral DVT.  She was initially started on LMWH, but this was changed to heparin drip on 12/31 in case anticoagulation needs to be stopped quickly.  Today, 11/15/2021: - Confirmatory heparin level therapeutic at 0.55 on heparin 900 units/hr - Hgb 7.9 (stable), plts 267K.  - No bleeding documented  Goal of Therapy:  Heparin level 0.3-0.7 units/ml Monitor platelets by anticoagulation protocol: Yes   Plan:  - Continue heparin drip at 900 units/hr - Daily heparin level and cbc - Monitor for s/sx bleeding  Dimple Nanas, PharmD 11/15/2021 7:19 AM

## 2021-11-16 DIAGNOSIS — D5919 Other autoimmune hemolytic anemia: Secondary | ICD-10-CM | POA: Diagnosis not present

## 2021-11-16 LAB — TYPE AND SCREEN
ABO/RH(D): O POS
ABO/RH(D): O POS
Antibody Screen: POSITIVE
Antibody Screen: POSITIVE
DAT, IgG: POSITIVE
DAT, IgG: POSITIVE
Donor AG Type: NEGATIVE
Donor AG Type: NEGATIVE
Unit division: 0
Unit division: 0

## 2021-11-16 LAB — BPAM RBC
Blood Product Expiration Date: 202301162359
Blood Product Expiration Date: 202301162359
Unit Type and Rh: 5100
Unit Type and Rh: 5100

## 2021-11-16 LAB — CBC
HCT: 22.1 % — ABNORMAL LOW (ref 36.0–46.0)
Hemoglobin: 7.3 g/dL — ABNORMAL LOW (ref 12.0–15.0)
MCH: 36.9 pg — ABNORMAL HIGH (ref 26.0–34.0)
MCHC: 33 g/dL (ref 30.0–36.0)
MCV: 111.6 fL — ABNORMAL HIGH (ref 80.0–100.0)
Platelets: 263 10*3/uL (ref 150–400)
RBC: 1.98 MIL/uL — ABNORMAL LOW (ref 3.87–5.11)
RDW: 32.5 % — ABNORMAL HIGH (ref 11.5–15.5)
WBC: 27.6 10*3/uL — ABNORMAL HIGH (ref 4.0–10.5)
nRBC: 18.6 % — ABNORMAL HIGH (ref 0.0–0.2)

## 2021-11-16 LAB — CA 125: Cancer Antigen (CA) 125: 24.8 U/mL (ref 0.0–38.1)

## 2021-11-16 LAB — COMPREHENSIVE METABOLIC PANEL
ALT: 18 U/L (ref 0–44)
AST: 26 U/L (ref 15–41)
Albumin: 3.6 g/dL (ref 3.5–5.0)
Alkaline Phosphatase: 82 U/L (ref 38–126)
Anion gap: 7 (ref 5–15)
BUN: 46 mg/dL — ABNORMAL HIGH (ref 8–23)
CO2: 23 mmol/L (ref 22–32)
Calcium: 8.9 mg/dL (ref 8.9–10.3)
Chloride: 108 mmol/L (ref 98–111)
Creatinine, Ser: 1.02 mg/dL — ABNORMAL HIGH (ref 0.44–1.00)
GFR, Estimated: 59 mL/min — ABNORMAL LOW (ref >60)
Glucose, Bld: 90 mg/dL (ref 70–99)
Potassium: 3.5 mmol/L (ref 3.5–5.1)
Sodium: 138 mmol/L (ref 135–145)
Total Bilirubin: 2.2 mg/dL — ABNORMAL HIGH (ref 0.3–1.2)
Total Protein: 6.4 g/dL — ABNORMAL LOW (ref 6.5–8.1)

## 2021-11-16 LAB — HEPARIN LEVEL (UNFRACTIONATED): Heparin Unfractionated: 0.42 IU/mL (ref 0.30–0.70)

## 2021-11-16 LAB — RETICULOCYTES
Immature Retic Fract: 51 % — ABNORMAL HIGH (ref 2.3–15.9)
RBC.: 1.95 MIL/uL — ABNORMAL LOW (ref 3.87–5.11)
Retic Count, Absolute: 447.5 10*3/uL — ABNORMAL HIGH (ref 19.0–186.0)
Retic Ct Pct: 23 % — ABNORMAL HIGH (ref 0.4–3.1)

## 2021-11-16 LAB — LACTATE DEHYDROGENASE: LDH: 977 U/L — ABNORMAL HIGH (ref 98–192)

## 2021-11-16 MED ORDER — EPINEPHRINE 0.3 MG/0.3ML IJ SOAJ: 0.3000 mg | Freq: Once | INTRAMUSCULAR | Status: DC | PRN

## 2021-11-16 MED ORDER — SODIUM CHLORIDE 0.9% FLUSH: 10.0000 mL | INTRAVENOUS | Status: DC | PRN

## 2021-11-16 MED ORDER — SODIUM CHLORIDE 0.9% FLUSH: 3.0000 mL | INTRAVENOUS | Status: DC | PRN

## 2021-11-16 MED ORDER — METHYLPREDNISOLONE SODIUM SUCC 125 MG IJ SOLR: 125.0000 mg | Freq: Once | INTRAMUSCULAR | Status: DC | PRN

## 2021-11-16 MED ORDER — DIPHENHYDRAMINE HCL 25 MG PO CAPS
50.0000 mg | ORAL_CAPSULE | Freq: Once | ORAL | Status: AC
  Administered 2021-11-16: 50 mg via ORAL
  Filled 2021-11-16: qty 2

## 2021-11-16 MED ORDER — DIPHENHYDRAMINE HCL 50 MG/ML IJ SOLN: 50.0000 mg | Freq: Once | INTRAMUSCULAR | Status: DC | PRN

## 2021-11-16 MED ORDER — SODIUM CHLORIDE 0.9 % IV SOLN: Freq: Once | INTRAVENOUS | Status: AC

## 2021-11-16 MED ORDER — ALBUTEROL SULFATE (2.5 MG/3ML) 0.083% IN NEBU: 2.5000 mg | INHALATION_SOLUTION | Freq: Once | RESPIRATORY_TRACT | Status: DC | PRN

## 2021-11-16 MED ORDER — ALTEPLASE 2 MG IJ SOLR: 2.0000 mg | Freq: Once | INTRAMUSCULAR | Status: DC | PRN

## 2021-11-16 MED ORDER — SODIUM CHLORIDE 0.9 % IV SOLN: Freq: Once | INTRAVENOUS | Status: DC | PRN

## 2021-11-16 MED ORDER — FAMOTIDINE IN NACL 20-0.9 MG/50ML-% IV SOLN: 20.0000 mg | Freq: Once | INTRAVENOUS | Status: DC | PRN

## 2021-11-16 MED ORDER — HEPARIN SOD (PORK) LOCK FLUSH 100 UNIT/ML IV SOLN: 500.0000 [IU] | Freq: Once | INTRAVENOUS | Status: DC | PRN

## 2021-11-16 MED ORDER — HEPARIN SOD (PORK) LOCK FLUSH 100 UNIT/ML IV SOLN: 250.0000 [IU] | Freq: Once | INTRAVENOUS | Status: DC | PRN

## 2021-11-16 MED ORDER — FOLIC ACID 1 MG PO TABS
1.0000 mg | ORAL_TABLET | Freq: Every day | ORAL | Status: DC
  Administered 2021-11-16 – 2021-11-20 (×5): 1 mg via ORAL
  Filled 2021-11-16 (×5): qty 1

## 2021-11-16 MED ORDER — SODIUM CHLORIDE 0.9 % IV SOLN
375.0000 mg/m2 | Freq: Once | INTRAVENOUS | Status: AC
  Administered 2021-11-16: 700 mg via INTRAVENOUS
  Filled 2021-11-16: qty 50

## 2021-11-16 MED ORDER — ACETAMINOPHEN 325 MG PO TABS
650.0000 mg | ORAL_TABLET | Freq: Once | ORAL | Status: AC
  Administered 2021-11-16: 650 mg via ORAL
  Filled 2021-11-16: qty 2

## 2021-11-16 NOTE — Evaluation (Signed)
Physical Therapy Evaluation Patient Details Name: MANJIT BUFANO MRN: [id number] DOB: 1951/07/19 Today's Date: 11/16/2021  History of Present Illness  71yo female who presented to [facility] on 12/29 with dizziness, nausea, vomiting, found to be anemic with Hgb at 4.5, also found to have B DVT which was treated with Heparin infusion. 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 PMH HTN, CVA, ankle sx  Clinical Impression   Received in bed, pleasant and cooperative although does seem to have some STM deficits- unclear if this is new since her CVA or chronic. Borderline orthostatic with BP drop from 150s/90s to 132/95 with standing for 3 minutes, MD present and aware. Mobilized well mostly at a min guard level, did have one time need for MinA due to LOB otherwise no need for physical assist. HR and SpO2 stable with activity on RA. Left up in recliner with all needs met- will benefit from skilled OP PT services at DC, might also benefit from PCA to assist in caring for spouse who has terminal CA per her report.      Recommendations for follow up therapy are one component of a multi-disciplinary discharge planning process, led by the attending physician.  Recommendations may be updated based on patient status, additional functional criteria and insurance authorization.  Follow Up Recommendations Outpatient PT    Assistance Recommended at Discharge PRN  Functional Status Assessment Patient has had a recent decline in their functional status and demonstrates the ability to make significant improvements in function in a reasonable and predictable amount of time.  Equipment Recommendations  Rollator (4 wheels)    Recommendations for Other Services       Precautions / Restrictions Precautions Precautions: Fall;Other (comment) Precaution Comments: watch BP- mildly orthostatic Restrictions Weight Bearing Restrictions: No      Mobility  Bed  Mobility Overal bed mobility: Modified Independent             General bed mobility comments: HOB elevated, increased time and effort    Transfers Overall transfer level: Needs assistance Equipment used: None Transfers: Sit to/from Stand Sit to Stand: Supervision           General transfer comment: S for safety/line management, no physical assist given    Ambulation/Gait Ambulation/Gait assistance: Min guard Gait Distance (Feet): 200 Feet Assistive device: None Gait Pattern/deviations: Drifts right/left;Narrow base of support;Step-through pattern Gait velocity: decreased     General Gait Details: slow and mildly unsteady, mostly only needed min guard but did have one brief moment of MinA due to random LOB posteriorly. Easily fatigued. VSS on RA  Stairs            Wheelchair Mobility    Modified Rankin (Stroke Patients Only)       Balance Overall balance assessment: Needs assistance Sitting-balance support: Feet supported;No upper extremity supported Sitting balance-Leahy Scale: Good     Standing balance support: No upper extremity supported;During functional activity Standing balance-Leahy Scale: Fair Standing balance comment: mostly min guard, needed one time MinA for balance                             Pertinent Vitals/Pain Pain Assessment: No/denies pain    Home Living Family/patient expects to be discharged to:: Private residence Living Arrangements: Spouse/significant other Available Help at Discharge: Family;Available 24 hours/day (husband dealing with terminal cancer, she has been taking care of him) Type of Home: House  Home Access: Stairs to enter Entrance Stairs-Rails: Can reach both;Right;Left Entrance Stairs-Number of Steps: 2   Home Layout: One level Home Equipment: None Additional Comments: at first tells me she has no equipment, then later tells me "I have all the equipment under the sun after my stroke"    Prior  Function Prior Level of Function : Independent/Modified Independent;Driving             Mobility Comments: enjoys bowling       Hand Dominance   Dominant Hand: Right    Extremity/Trunk Assessment   Upper Extremity Assessment Upper Extremity Assessment: Generalized weakness    Lower Extremity Assessment Lower Extremity Assessment: Generalized weakness    Cervical / Trunk Assessment Cervical / Trunk Assessment: Normal  Communication   Communication: Expressive difficulties  Cognition Arousal/Alertness: Awake/alert Behavior During Therapy: WFL for tasks assessed/performed Overall Cognitive Status: Within Functional Limits for tasks assessed                                 General Comments: impaired STM, pt aware and asks that staff call daughter in law tiffany with any updates. Unclear if this is new or since recent CVA vs chronic, etc        General Comments General comments (skin integrity, edema, etc.): mildly orthostatic- BP in 150s/90s except after standing x3 minutes when it dropped to 132/95, asymptomatic and MD present/aware    Exercises     Assessment/Plan    PT Assessment Patient needs continued PT services  PT Problem List Decreased strength;Decreased knowledge of use of DME;Decreased activity tolerance;Decreased safety awareness;Decreased balance;Decreased mobility;Cardiopulmonary status limiting activity       PT Treatment Interventions DME instruction;Balance training;Gait training;Stair training;Functional mobility training;Patient/family education;Therapeutic activities;Therapeutic exercise    PT Goals (Current goals can be found in the Care Plan section)  Acute Rehab PT Goals Patient Stated Goal: feel better, live another 20 years PT Goal Formulation: With patient Time For Goal Achievement: 11/30/21 Potential to Achieve Goals: Good    Frequency Min 3X/week   Barriers to discharge        Co-evaluation                AM-PAC PT "6 Clicks" Mobility  Outcome Measure Help needed turning from your back to your side while in a flat bed without using bedrails?: None Help needed moving from lying on your back to sitting on the side of a flat bed without using bedrails?: A Little Help needed moving to and from a bed to a chair (including a wheelchair)?: A Little Help needed standing up from a chair using your arms (e.g., wheelchair or bedside chair)?: A Little Help needed to walk in hospital room?: A Little Help needed climbing 3-5 steps with a railing? : A Little 6 Click Score: 19    End of Session   Activity Tolerance: Patient tolerated treatment well Patient left: in chair;with call bell/phone within reach Nurse Communication: Mobility status PT Visit Diagnosis: Unsteadiness on feet (R26.81);Muscle weakness (generalized) (M62.81)    Time: 1610-9604 PT Time Calculation (min) (ACUTE ONLY): 30 min   Charges:   PT Evaluation $PT Eval Moderate Complexity: 1 Mod PT Treatments $Gait Training: 8-22 mins       Windell Norfolk, DPT, PN2   Supplemental Physical Therapist [place]    Pager [phone] Acute Rehab Office [phone]

## 2021-11-16 NOTE — Progress Notes (Signed)
ANTICOAGULATION CONSULT NOTE - Follow Up Consult  Pharmacy Consult for heparin  Indication: acute DVT  No Known Allergies  Patient Measurements: Height: 5\' 3"  (160 cm) Weight: 70.1 kg (154 lb 8.7 oz) IBW/kg (Calculated) : 52.4 Heparin Dosing Weight: 66 kg  Vital Signs: Temp: 98.2 F (36.8 C) (01/02 0630) Temp Source: Oral (01/02 0630) BP: 150/73 (01/02 0630) Pulse Rate: 60 (01/02 0630)  Labs: Recent Labs    11/14/21 0154 11/14/21 1834 11/15/21 0552 11/16/21 0618  HGB 8.0*  --  7.9* 7.3*  HCT 23.2*  --  23.8* 22.1*  PLT 282  --  267 263  HEPARINUNFRC  --  0.46 0.55 0.42  CREATININE 1.10*  --  0.91 1.02*     Estimated Creatinine Clearance: 48.2 mL/min (A) (by C-G formula based on SCr of 1.02 mg/dL (H)).   Assessment: Patient is a 71 y.o F with hx CVA in Nov 2022 (s/p thrombectomy and stent placement) and was placed on Brilinta and asa after the procedure. She presented to the ED on 11/12/21 with c/o n/v and dizziness and was found to have a hgb of 4.5.  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  LE doppler on 12/30 came back with bilateral DVT.  She was initially started on LMWH, but this was changed to heparin drip on 12/31 in case anticoagulation needs to be stopped quickly.  Today, 11/16/2021: -  heparin level therapeutic at 0.42 on heparin 900 units/hr - Hgb 7.3,  plts 977 K.  - No bleeding documented  Goal of Therapy:  Heparin level 0.3-0.7 units/ml Monitor platelets by anticoagulation protocol: Yes   Plan:  - Continue heparin drip at 900 units/hr - Daily heparin level and cbc - Monitor for s/sx bleeding  Eudelia Bunch, Pharm.D 11/16/2021 7:33 AM

## 2021-11-16 NOTE — Progress Notes (Signed)
IP PROGRESS NOTE  Subjective:   Andrea Williamson has no new complaint.  She has an intermittent feeling of being "lightheaded ".  No bleeding.  Objective: Vital signs in last 24 hours: Blood pressure (!) 150/73, pulse 60, temperature 98.2 F (36.8 C), temperature source Oral, resp. rate 17, height 5\' 3"  (1.6 m), weight 154 lb 8.7 oz (70.1 kg), SpO2 97 %.  Intake/Output from previous day: 01/01 0701 - 01/02 0700 In: 480 [P.O.:480] Out: -   Physical Exam:  HEENT: Scleral icterus, no thrush Lungs: Clear bilaterally, no respiratory distress Cardiac: Regular rate and rhythm, occasional premature beat Abdomen: No hepatosplenomegaly, no mass, nontender Extremities: No leg edema Lymph nodes: No cervical, supraclavicular, axillary, or inguinal nodes   Lab Results: Recent Labs    11/15/21 0552 11/16/21 0618  WBC 25.9* PENDING  HGB 7.9* 7.3*  HCT 23.8* 22.1*  PLT 267 263   Review of peripheral blood smear from 11/15/2021: The platelets appear normal in number, the majority of the white cells are mature neutrophils, few myelocytes and metamyelocytes, no blasts.  The polychromasia is markedly increased.  Numerous nucleated red cells, few earlier Red cell precursors.  Spherocytes. BMET Recent Labs    11/14/21 0154 11/15/21 0552  NA 135 137  K 3.0* 3.3*  CL 101 105  CO2 24 23  GLUCOSE 104* 101*  BUN 32* 43*  CREATININE 1.10* 0.91  CALCIUM 9.3 9.3     Medications: I have reviewed the patient's current medications.  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  The hemoglobin is slightly lower today.  She continues to mount a  reticulocytosis and the LDH remains elevated.  She appears to have ongoing hemolysis.  The plan is to begin rituximab today as recommended by Dr. Lindi Adie.  I reviewed potential toxicities associated with rituximab including the chance of an allergic reaction, CNS toxicity, cytopenias, atypical infection, and reactivation of hepatitis.  She agrees to proceed.    Recommendations: Continue Solu-Medrol Rituximab today GYN oncology consult Continue heparin for the DVTs Daily CBC, CMP, LDH Daily folic acid    LOS: 3 days   Betsy Coder, MD   11/16/2021, 6:45 AM

## 2021-11-16 NOTE — Progress Notes (Signed)
PROGRESS NOTE  Andrea Williamson XBM:[id number] DOB: May 27, 1951 DOA: 11/13/2021 PCP: Pcp, No   LOS: 3 days   Brief narrative: Andrea Williamson is a 71 y.o. female with past medical history of hypertension, hyperlipidemia, recent left ACA stroke status post left A2 thrombectomy and stent placement on 10/01/2021 presented to the hospital initially at [facility] with dizziness nausea and vomiting.  In the ED patient was noted to have hemoglobin of 4.5 which was a significant drop from hemoglobin of 13 previous months.  Bilirubin was elevated at 6.1 as well.  Hematology was consulted and hemolytic anemia was considered with LDH elevated at 734, patient was given 2 units of packed RBC and started on systemic steroids.  In view of possible need of further reduction patient was a transfer to [facility] hospital.  She also had a CT scan of the abdomen and pelvis which showed a right adnexal mass around 1.4 cm suspicious for possible malignancy.  She was also noted to have bilateral lower extremity edema and ultrasound showed DVT.  Patient was then started on IV heparin drip as well.    Assessment/Plan:  Principal Problem:   Hemolytic anemia (HCC) Active Problems:   Stroke (cerebrum) (HCC)   Essential hypertension   DVT (deep venous thrombosis) (HCC)   Adnexal mass  Warm autoimmune hemolytic anemia  Patient presented with dizziness.  Initial hemoglobin low at 4.5.  Had improved with blood transfusion starting to trend down.  LDH still elevated.  Concern for ongoing hemolysis.  Oncology today has decided on administering rituximab..  Currently receiving Solu-Medrol.   HIV hepatitis B and C nonreactive.  PNH and flow cytometry pending.  Bilateral lower extremity DVT Currently on heparin drip.  Oncology on board.   Right adnexal mass  CT chest of the abdomen pelvis showing adnexal mass.  Pelvic ultrasound showing complex lesion with echogenicity around 1.4 cm with internal vascularity.  Possibility of malignancy.   CA125 is pending.  I spoke with Delsa Sale of GYN oncology who did not feel ovarian cancer but could be a tumor.  She recommended CEA and will follow-up as outpatient or inpatient if she stays here for few more days.    Hx of recent CVA s/p thrombectomy and stent  Left ACA CVA d/t A2 occlusion s/p thrombectomy and stent on 10/01/21.  Patient was on aspirin and Brilinta .  Was seen by neurology who recommended to hold aspirin and brillinta if the patient will be on anticoagulation with heparin.   Hypertension  Continue Norvasc.  Blood pressure seems to be stable at this time.  Hyperlipidemia  Continue Lipitor.  Anxiety Patient with increased anxiety and insomnia.  Increased the dose of Xanax yesterday and added Ambien with much better sleep and anxiety controlled today.    DVT prophylaxis:    On heparin drip.  Code Status: Full code  Family Communication:  I again spoke with the patients' daughter on the phone and updated her about the clinical condition.   Status is: Inpatient  Remains inpatient appropriate because: Hemolytic anemia, right adnexal mass, on IV heparin drip, closer monitoring, rituximab injection.   Consultants: Oncology GYN oncology  Procedures: PRBC transfusion.  Anti-infectives:  None  Anti-infectives (From admission, onward)    None      Subjective:  Today, patient was seen and examined at bedside.  Complains of mild dizziness but no chest pain, shortness of breath, fever or chills.  Anxiety has lessened.  Trying to work with physical therapy.  Objective: Vitals:   11/16/21 1330 11/16/21 1342  BP: (!) 148/71 (!) 143/81  Pulse: 63 64  Resp: 18 19  Temp: 98.4 F (36.9 C) 98.3 F (36.8 C)  SpO2: 95% 96%    Intake/Output Summary (Last 24 hours) at 11/16/2021 1422 Last data filed at 11/16/2021 1100 Gross per 24 hour  Intake 240 ml  Output --  Net 240 ml    Filed Weights   11/15/21 1445  Weight: 70.1 kg   Body mass index is 27.38  kg/m.   Physical Exam:  GENERAL: Patient is alert awake and oriented.  Not in obvious distress, HENT: Mild pallor noted..  Pupils equally reactive to light. Oral mucosa is moist NECK: is supple, no gross swelling noted. CHEST: Clear to auscultation. No crackles or wheezes.  Diminished breath sounds bilaterally. CVS: S1 and S2 heard, no murmur. Regular rate and rhythm.  ABDOMEN: Soft, non-tender, bowel sounds are present. EXTREMITIES: No edema. CNS: Cranial nerves are intact. No focal motor deficits.  Mildly anxious. SKIN: warm and dry without rashes.  Data Review: I have personally reviewed the following laboratory data and studies,  CBC: Recent Labs  Lab 11/12/21 2142 11/13/21 0019 11/13/21 1508 11/14/21 0154 11/15/21 0552 11/16/21 0618  WBC 22.0* 21.4*  --  28.3* 25.9* 27.6*  NEUTROABS  --  18.1*  --   --   --   --   HGB 4.5* 5.0* 8.7* 8.0* 7.9* 7.3*  HCT 13.4* 15.1*  --  23.2* 23.8* 22.1*  MCV 111.7* 111.0*  --  100.9* 108.7* 111.6*  PLT 352 348  --  282 267 629    Basic Metabolic Panel: Recent Labs  Lab 11/12/21 2142 11/14/21 0154 11/15/21 0552 11/16/21 0618  NA 135 135 137 138  K 3.4* 3.0* 3.3* 3.5  CL 103 101 105 108  CO2 18* 24 23 23   GLUCOSE 265* 104* 101* 90  BUN 38* 32* 43* 46*  CREATININE 1.06* 1.10* 0.91 1.02*  CALCIUM 9.6 9.3 9.3 8.9  MG  --  2.3  --   --     Liver Function Tests: Recent Labs  Lab 11/12/21 2142 11/12/21 2338 11/14/21 0154 11/15/21 0552 11/16/21 0618  AST 34 28 41 33 26  ALT 20 16 17 18 18   ALKPHOS 96 69 95 88 82  BILITOT 6.1* 4.7* 5.0* 2.8* 2.2*  PROT 7.4 5.3* 6.9 7.0 6.4*  ALBUMIN 3.8 2.9* 3.6 3.8 3.6    Recent Labs  Lab 11/12/21 2142  LIPASE 37    No results for input(s): AMMONIA in the last 168 hours. Cardiac Enzymes: No results for input(s): CKTOTAL, CKMB, CKMBINDEX, TROPONINI in the last 168 hours. BNP (last 3 results) No results for input(s): BNP in the last 8760 hours.  ProBNP (last 3 results) No  results for input(s): PROBNP in the last 8760 hours.  CBG: No results for input(s): GLUCAP in the last 168 hours. Recent Results (from the past 240 hour(s))  Resp Panel by RT-PCR (Flu A&B, Covid) Nasopharyngeal Swab     Status: None   Collection Time: 11/12/21  9:42 PM   Specimen: Nasopharyngeal Swab; Nasopharyngeal(NP) swabs in vial transport medium  Result Value Ref Range Status   SARS Coronavirus 2 by RT PCR NEGATIVE NEGATIVE Final    Comment: (NOTE) SARS-CoV-2 target nucleic acids are NOT DETECTED.  The SARS-CoV-2 RNA is generally detectable in upper respiratory specimens during the acute phase of infection. The lowest concentration of SARS-CoV-2 viral copies this assay can detect is  138 copies/mL. A negative result does not preclude SARS-Cov-2 infection and should not be used as the sole basis for treatment or other patient management decisions. A negative result may occur with  improper specimen collection/handling, submission of specimen other than nasopharyngeal swab, presence of viral mutation(s) within the areas targeted by this assay, and inadequate number of viral copies(<138 copies/mL). A negative result must be combined with clinical observations, patient history, and epidemiological information. The expected result is Negative.  Fact Sheet for Patients:  EntrepreneurPulse.com.au  Fact Sheet for Healthcare Providers:  IncredibleEmployment.be  This test is no t yet approved or cleared by the [place] FDA and  has been authorized for detection and/or diagnosis of SARS-CoV-2 by FDA under an Emergency Use Authorization (EUA). This EUA will remain  in effect (meaning this test can be used) for the duration of the COVID-19 declaration under Section 564(b)(1) of the Act, 21 U.S.C.section 360bbb-3(b)(1), unless the authorization is terminated  or revoked sooner.       Influenza A by PCR NEGATIVE NEGATIVE Final   Influenza B by  PCR NEGATIVE NEGATIVE Final    Comment: (NOTE) The Xpert Xpress SARS-CoV-2/FLU/RSV plus assay is intended as an aid in the diagnosis of influenza from Nasopharyngeal swab specimens and should not be used as a sole basis for treatment. Nasal washings and aspirates are unacceptable for Xpert Xpress SARS-CoV-2/FLU/RSV testing.  Fact Sheet for Patients: EntrepreneurPulse.com.au  Fact Sheet for Healthcare Providers: IncredibleEmployment.be  This test is not yet approved or cleared by the [place] FDA and has been authorized for detection and/or diagnosis of SARS-CoV-2 by FDA under an Emergency Use Authorization (EUA). This EUA will remain in effect (meaning this test can be used) for the duration of the COVID-19 declaration under Section 564(b)(1) of the Act, 21 U.S.C. section 360bbb-3(b)(1), unless the authorization is terminated or revoked.  Performed at [facility], [address], [place], [place] [address]       Studies: No results found.    Flora Lipps, MD  [facility] 11/16/2021  If 7PM-7AM, please contact night-coverage

## 2021-11-16 NOTE — Progress Notes (Signed)
Pts Ritux infusion ended at 2115 - IV flushed per protocol and no blood return obtained. IV was able to be flushed with no pain or complaints from patient. Provider notified and event charted appropriately. Plan of care continues.

## 2021-11-17 ENCOUNTER — Other Ambulatory Visit: Payer: Self-pay | Admitting: Gynecologic Oncology

## 2021-11-17 ENCOUNTER — Ambulatory Visit: Payer: Medicare Other

## 2021-11-17 ENCOUNTER — Encounter: Payer: Self-pay | Admitting: Hematology and Oncology

## 2021-11-17 DIAGNOSIS — D6481 Anemia due to antineoplastic chemotherapy: Secondary | ICD-10-CM

## 2021-11-17 DIAGNOSIS — D5911 Warm autoimmune hemolytic anemia: Principal | ICD-10-CM

## 2021-11-17 DIAGNOSIS — E876 Hypokalemia: Secondary | ICD-10-CM

## 2021-11-17 DIAGNOSIS — D696 Thrombocytopenia, unspecified: Secondary | ICD-10-CM

## 2021-11-17 LAB — RETICULOCYTES
Immature Retic Fract: 50.7 % — ABNORMAL HIGH (ref 2.3–15.9)
RBC.: 1.87 MIL/uL — ABNORMAL LOW (ref 3.87–5.11)
Retic Count, Absolute: 500.2 10*3/uL — ABNORMAL HIGH (ref 19.0–186.0)
Retic Ct Pct: 26.8 % — ABNORMAL HIGH (ref 0.4–3.1)

## 2021-11-17 LAB — COMP PANEL: LEUKEMIA/LYMPHOMA

## 2021-11-17 LAB — CBC
HCT: 22.6 % — ABNORMAL LOW (ref 36.0–46.0)
Hemoglobin: 7.3 g/dL — ABNORMAL LOW (ref 12.0–15.0)
MCH: 38 pg — ABNORMAL HIGH (ref 26.0–34.0)
MCHC: 32.3 g/dL (ref 30.0–36.0)
MCV: 117.7 fL — ABNORMAL HIGH (ref 80.0–100.0)
Platelets: 260 10*3/uL (ref 150–400)
RBC: 1.92 MIL/uL — ABNORMAL LOW (ref 3.87–5.11)
RDW: 34.5 % — ABNORMAL HIGH (ref 11.5–15.5)
WBC: 20.6 10*3/uL — ABNORMAL HIGH (ref 4.0–10.5)
nRBC: 18.3 % — ABNORMAL HIGH (ref 0.0–0.2)

## 2021-11-17 LAB — COMPREHENSIVE METABOLIC PANEL
ALT: 22 U/L (ref 0–44)
AST: 27 U/L (ref 15–41)
Albumin: 3.6 g/dL (ref 3.5–5.0)
Alkaline Phosphatase: 80 U/L (ref 38–126)
Anion gap: 10 (ref 5–15)
BUN: 34 mg/dL — ABNORMAL HIGH (ref 8–23)
CO2: 25 mmol/L (ref 22–32)
Calcium: 8.8 mg/dL — ABNORMAL LOW (ref 8.9–10.3)
Chloride: 106 mmol/L (ref 98–111)
Creatinine, Ser: 0.97 mg/dL (ref 0.44–1.00)
GFR, Estimated: >60 mL/min (ref >60)
Glucose, Bld: 106 mg/dL — ABNORMAL HIGH (ref 70–99)
Potassium: 3.7 mmol/L (ref 3.5–5.1)
Sodium: 141 mmol/L (ref 135–145)
Total Bilirubin: 2.8 mg/dL — ABNORMAL HIGH (ref 0.3–1.2)
Total Protein: 6.5 g/dL (ref 6.5–8.1)

## 2021-11-17 LAB — HEPARIN LEVEL (UNFRACTIONATED): Heparin Unfractionated: 0.39 IU/mL (ref 0.30–0.70)

## 2021-11-17 LAB — CEA: CEA: 2 ng/mL (ref 0.0–4.7)

## 2021-11-17 LAB — LACTATE DEHYDROGENASE: LDH: 938 U/L — ABNORMAL HIGH (ref 98–192)

## 2021-11-17 NOTE — Progress Notes (Signed)
ANTICOAGULATION CONSULT NOTE   Pharmacy Consult for heparin  Indication: acute DVT  No Known Allergies  Patient Measurements: Height: 5\' 3"  (160 cm) Weight: 70.1 kg (154 lb 8.7 oz) IBW/kg (Calculated) : 52.4 Heparin Dosing Weight: 66 kg  Vital Signs: Temp: 98.4 F (36.9 C) (01/03 0425) Temp Source: Oral (01/03 0425) BP: 148/85 (01/03 0425) Pulse Rate: 60 (01/03 0425)  Labs: Recent Labs    11/15/21 0552 11/16/21 0618 11/17/21 0454  HGB 7.9* 7.3* 7.3*  HCT 23.8* 22.1* 22.6*  PLT 267 263 260  HEPARINUNFRC 0.55 0.42 0.39  CREATININE 0.91 1.02* 0.97    Estimated Creatinine Clearance: 50.7 mL/min (by C-G formula based on SCr of 0.97 mg/dL).  Assessment: Patient is a 71 y.o F with hx CVA in Nov 2022 (s/p thrombectomy and stent placement) and was placed on Brilinta and asa after the procedure. She presented to the ED on 11/12/21 with c/o n/v and dizziness and was found to have a hgb of 4.5.  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  LE doppler on 12/30 came back with bilateral DVT.  She was initially started on LMWH, but this was changed to heparin drip on 12/31 in case anticoagulation needs to be stopped quickly.  Today, 11/17/2021: -  heparin level therapeutic at 0.39 on heparin 900 units/hr - Hgb 7.3,  plts 938 K.  - No bleeding documented  Goal of Therapy:  Heparin level 0.3-0.7 units/ml Monitor platelets by anticoagulation protocol: Yes   Plan:  - Continue heparin drip at 900 units/hr - Daily heparin level and cbc - Monitor for s/sx bleeding  Minda Ditto PharmD [facility] [phone] 11/17/2021, 10:19 AM

## 2021-11-17 NOTE — Progress Notes (Addendum)
IP PROGRESS NOTE  Subjective:   Andrea Williamson has no new complaint.  Lightheadedness has improved.  No bleeding reported.  Objective: Vital signs in last 24 hours: Blood pressure (!) 148/85, pulse 60, temperature 98.4 F (36.9 C), temperature source Oral, resp. rate 18, height 5\' 3"  (1.6 m), weight 70.1 kg, SpO2 98 %.  Intake/Output from previous day: 01/02 0701 - 01/03 0700 In: 1005.6 [P.O.:240; I.V.:445.6; IV Piggyback:320] Out: -   Physical Exam:  HEENT: Scleral icterus, no thrush Lungs: Clear bilaterally, no respiratory distress Cardiac: Regular rate and rhythm, occasional premature beat Abdomen: No hepatosplenomegaly, no mass, nontender Extremities: No leg edema Lymph nodes: No cervical, supraclavicular, axillary, or inguinal nodes   Lab Results: Recent Labs    11/16/21 0618 11/17/21 0454  WBC 27.6* 20.6*  HGB 7.3* 7.3*  HCT 22.1* 22.6*  PLT 263 260   Review of peripheral blood smear from 11/15/2021: The platelets appear normal in number, the majority of the white cells are mature neutrophils, few myelocytes and metamyelocytes, no blasts.  The polychromasia is markedly increased.  Numerous nucleated red cells, few earlier Red cell precursors.  Spherocytes. BMET Recent Labs    11/16/21 0618 11/17/21 0454  NA 138 141  K 3.5 3.7  CL 108 106  CO2 23 25  GLUCOSE 90 106*  BUN 46* 34*  CREATININE 1.02* 0.97  CALCIUM 8.9 8.8*     Medications: I have reviewed the patient's current medications.  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 appears stable.  She received a dose of rituximab on 11/16/2021 and tolerated well.  Her  hemoglobin is stable this morning.  Recommend close monitoring of her hemoglobin.  She has bilateral DVT I recommend continuation of heparin.  She was noted to have a right adnexal mass -cystic/solid.  CA125 has been ordered and is currently pending.  Discussed with GYN oncology who will consult on the patient.  Recommendations: Continue Solu-Medrol GYN oncology consult Continue heparin for the DVTs Daily CBC, CMP, LDH Daily folic acid   LOS: 4 days   Mikey Bussing, NP   11/17/2021, 7:58 AM [name] was interviewed and examined.  The hemoglobin is stable.  Evidence of hemolysis remains with an elevated reticulocyte count, bilirubin, and LDH.  No indication for transfusion today.  I recommend continuing Solu-Medrol.  She tolerated the rituximab well.  We will consult GYN oncology to evaluate the adnexal mass.  I recommended continuing heparin anticoagulation until decision is made regarding biopsy of the adnexal mass.

## 2021-11-17 NOTE — Progress Notes (Addendum)
PROGRESS NOTE  Andrea Williamson UUV:[id number] DOB: [birth year] DOA: 11/13/2021 PCP: Pcp, No   LOS: 4 days   Brief narrative:  Andrea Williamson is a 71 y.o. female with past medical history of hypertension, hyperlipidemia, recent left ACA stroke status post left A2 thrombectomy and stent placement on 10/01/2021 presented to the hospital initially at [facility] with dizziness nausea and vomiting.  In the ED, patient was noted to have hemoglobin of 4.5 which was a significant drop from hemoglobin of 13 previous months.  Bilirubin was elevated at 6.1 as well.  Hematology was consulted and hemolytic anemia was considered with LDH elevated at 734, patient was given 2 units of packed RBC and started on systemic steroids.  In view of possible need of further reduction patient was a transfer to [facility] long hospital.  She also had a CT scan of the abdomen and pelvis which showed a right adnexal mass around 1.4 cm suspicious for possible malignancy.  She was also noted to have bilateral lower extremity edema and ultrasound showed DVT.  Patient was then started on IV heparin drip as well.    Assessment/Plan:  Principal Problem:   Hemolytic anemia (HCC) Active Problems:   Stroke (cerebrum) (HCC)   Essential hypertension   DVT (deep venous thrombosis) (HCC)   Adnexal mass  Warm autoimmune hemolytic anemia  Patient presented with dizziness.  Initial hemoglobin low at 4.5.  Received PRBC transfusion.  Received rituximab as per oncology on 11/16/2021.  C continue Solu-Medrol.   HIV hepatitis B and C nonreactive.  PNH and flow cytometry pending.  Hemoglobin has remained stable at this time.  LDH at 938 today from 977.  We will continue to monitor.  CBC Latest Ref Rng & Units 11/17/2021 11/16/2021 11/15/2021  WBC 4.0 - 10.5 K/uL 20.6(H) 27.6(H) 25.9(H)  Hemoglobin 12.0 - 15.0 g/dL 7.3(L) 7.3(L) 7.9(L)  Hematocrit 36.0 - 46.0 % 22.6(L) 22.1(L) 23.8(L)  Platelets 150 - 400 K/uL 260 263 267    Bilateral lower extremity DVT Currently  on heparin drip.  Oncology on board.   Right adnexal mass  CT chest of the abdomen pelvis showing adnexal mass.  Pelvic ultrasound showing complex lesion with echogenicity around 1.4 cm with internal vascularity.  Possibility of malignancy.  CA125 is pending.  CEA at 2.0.  I spoke with Delsa Sale of GYN oncology who did not feel ovarian cancer but could be a tumor.   Plan is inpatient follow-up  Hx of recent CVA s/p thrombectomy and stent  Left ACA CVA d/t A2 occlusion s/p thrombectomy and stent on 10/01/21.  Patient was on aspirin and Brilinta .  Was seen by neurology who recommended to hold aspirin and brillinta if the patient will be on anticoagulation with heparin.   Hypertension  Continue Norvasc.  Blood pressure seems to be stable at this time.  Hyperlipidemia  Continue Lipitor.  Anxiety Improved with increased dose of Xanax and Ambien.   Deconditioning, debility. Will need outpatient PT.  DVT prophylaxis:    On heparin drip.  Code Status: Full code  Family Communication:  I spoke with the patients' daughter on the phone and updated her about the clinical condition.   Status is: Inpatient  Remains inpatient appropriate because: Hemolytic anemia, right adnexal mass, on IV heparin drip, closer monitoring, status post rituximab injection, GYN follow-up.  Consultants: Oncology GYN oncology   Procedures: PRBC transfusion.  Anti-infectives:  None  Anti-infectives (From admission, onward)    None  Subjective: Today, patient was seen and examined after.  States that her lightheadedness has improved.  No bleeding.  Denies any chest pain, shortness of breath, fever, chest pain.  Objective: Vitals:   11/17/21 0425 11/17/21 1244  BP: (!) 148/85 136/79  Pulse: 60 65  Resp: 18 18  Temp: 98.4 F (36.9 C) 98.7 F (37.1 C)  SpO2: 98% 99%    Intake/Output Summary (Last 24 hours) at 11/17/2021 1525 Last data filed at 11/17/2021 0900 Gross per 24 hour  Intake  154.51 ml  Output --  Net 154.51 ml    Filed Weights   11/15/21 1445  Weight: 70.1 kg   Body mass index is 27.38 kg/m.   Physical Exam: General:  Average built, not in obvious distress HENT: Mild pallor noted.  Oral mucosa is moist.  Chest:  Clear breath sounds.  Diminished breath sounds bilaterally. No crackles or wheezes.  CVS: S1 &S2 heard. No murmur.  Regular rate and rhythm. Abdomen: Soft, nontender, nondistended.  Bowel sounds are heard.   Extremities: No cyanosis, clubbing or edema.  Peripheral pulses are palpable. Psych: Alert, awake and oriented, normal mood CNS:  No cranial nerve deficits.  Power equal in all extremities.   Skin: Warm and dry.  No rashes noted.  Data Review: I have personally reviewed the following laboratory data and studies,  CBC: Recent Labs  Lab 11/13/21 0019 11/13/21 1508 11/14/21 0154 11/15/21 0552 11/16/21 0618 11/17/21 0454  WBC 21.4*  --  28.3* 25.9* 27.6* 20.6*  NEUTROABS 18.1*  --   --   --   --   --   HGB 5.0* 8.7* 8.0* 7.9* 7.3* 7.3*  HCT 15.1*  --  23.2* 23.8* 22.1* 22.6*  MCV 111.0*  --  100.9* 108.7* 111.6* 117.7*  PLT 348  --  282 267 263 631    Basic Metabolic Panel: Recent Labs  Lab 11/12/21 2142 11/14/21 0154 11/15/21 0552 11/16/21 0618 11/17/21 0454  NA 135 135 137 138 141  K 3.4* 3.0* 3.3* 3.5 3.7  CL 103 101 105 108 106  CO2 18* 24 23 23 25   GLUCOSE 265* 104* 101* 90 106*  BUN 38* 32* 43* 46* 34*  CREATININE 1.06* 1.10* 0.91 1.02* 0.97  CALCIUM 9.6 9.3 9.3 8.9 8.8*  MG  --  2.3  --   --   --     Liver Function Tests: Recent Labs  Lab 11/12/21 2338 11/14/21 0154 11/15/21 0552 11/16/21 0618 11/17/21 0454  AST 28 41 33 26 27  ALT 16 17 18 18 22   ALKPHOS 69 95 88 82 80  BILITOT 4.7* 5.0* 2.8* 2.2* 2.8*  PROT 5.3* 6.9 7.0 6.4* 6.5  ALBUMIN 2.9* 3.6 3.8 3.6 3.6    Recent Labs  Lab 11/12/21 2142  LIPASE 37    No results for input(s): AMMONIA in the last 168 hours. Cardiac Enzymes: No  results for input(s): CKTOTAL, CKMB, CKMBINDEX, TROPONINI in the last 168 hours. BNP (last 3 results) No results for input(s): BNP in the last 8760 hours.  ProBNP (last 3 results) No results for input(s): PROBNP in the last 8760 hours.  CBG: No results for input(s): GLUCAP in the last 168 hours. Recent Results (from the past 240 hour(s))  Resp Panel by RT-PCR (Flu A&B, Covid) Nasopharyngeal Swab     Status: None   Collection Time: 11/12/21  9:42 PM   Specimen: Nasopharyngeal Swab; Nasopharyngeal(NP) swabs in vial transport medium  Result Value Ref Range Status  SARS Coronavirus 2 by RT PCR NEGATIVE NEGATIVE Final    Comment: (NOTE) SARS-CoV-2 target nucleic acids are NOT DETECTED.  The SARS-CoV-2 RNA is generally detectable in upper respiratory specimens during the acute phase of infection. The lowest concentration of SARS-CoV-2 viral copies this assay can detect is 138 copies/mL. A negative result does not preclude SARS-Cov-2 infection and should not be used as the sole basis for treatment or other patient management decisions. A negative result may occur with  improper specimen collection/handling, submission of specimen other than nasopharyngeal swab, presence of viral mutation(s) within the areas targeted by this assay, and inadequate number of viral copies(<138 copies/mL). A negative result must be combined with clinical observations, patient history, and epidemiological information. The expected result is Negative.  Fact Sheet for Patients:  EntrepreneurPulse.com.au  Fact Sheet for Healthcare Providers:  IncredibleEmployment.be  This test is no t yet approved or cleared by the [place] FDA and  has been authorized for detection and/or diagnosis of SARS-CoV-2 by FDA under an Emergency Use Authorization (EUA). This EUA will remain  in effect (meaning this test can be used) for the duration of the COVID-19 declaration under Section  564(b)(1) of the Act, 21 U.S.C.section 360bbb-3(b)(1), unless the authorization is terminated  or revoked sooner.       Influenza A by PCR NEGATIVE NEGATIVE Final   Influenza B by PCR NEGATIVE NEGATIVE Final    Comment: (NOTE) The Xpert Xpress SARS-CoV-2/FLU/RSV plus assay is intended as an aid in the diagnosis of influenza from Nasopharyngeal swab specimens and should not be used as a sole basis for treatment. Nasal washings and aspirates are unacceptable for Xpert Xpress SARS-CoV-2/FLU/RSV testing.  Fact Sheet for Patients: EntrepreneurPulse.com.au  Fact Sheet for Healthcare Providers: IncredibleEmployment.be  This test is not yet approved or cleared by the [place] FDA and has been authorized for detection and/or diagnosis of SARS-CoV-2 by FDA under an Emergency Use Authorization (EUA). This EUA will remain in effect (meaning this test can be used) for the duration of the COVID-19 declaration under Section 564(b)(1) of the Act, 21 U.S.C. section 360bbb-3(b)(1), unless the authorization is terminated or revoked.  Performed at [facility], [address] [place], [place] [address]       Studies: No results found.   Flora Lipps, MD  [facility] 11/17/2021  If 7PM-7AM, please contact night-coverage

## 2021-11-17 NOTE — Consult Note (Signed)
Gynecologic Oncology Consultation  HPI: Andrea Williamson is a 71 year old female with a medical history that includes HTN, CVA s/p left A2 thrombectomy with stent placement 10/01/2021 who presented to the [facility] ER with symptoms of nausea, weakness, dizziness for the past week. Ct imaging of the head was negative for acute intracranial abnormality with small chronic appearing left ACA infarcts. 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  CT AP with contrast on 11/13/2021 revealed a solid and cystic right adnexal mass measuring 4.9 cms, non specific stranding in the RUQ of the abd with associated sub-centimeter lymph nodes. 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  To further evaluate the ovarian mass, she underwent a pelvic/transvaginal ultrasound with the impression showing a complex lesion in the right adnexa with mostly cystic echogenicity along with a lobulated solid nodule measuring 1.4 cm in size with demonstrable internal vascularity. Possibility of malignant neoplastic process in the right ovary is not excluded. 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 Left ovary is not sonographically visualized.  Interval History: The patient states she presented to the hospital with dizziness. She is a poor historian and reports lapses in memory. She is not had periodic routine screenings for malignancies i.e. cervical, colon, breast.  She denies any pelvic pain, vaginal bleeding or changes in her weight, bowel/bladder habits.  Her mother was diagnosed with a possible kidney cancer and other cancers that the patient cannot recall.    Review of Systems: Performed by Dr. Merrilee Jansky No bloating, nausea, early satiety, weight loss, fatigue.   Current Meds: Current meds reviewed.  Allergy: No Known Allergies  Social Hx:   Social History   Socioeconomic  History   Marital status: Married    Spouse name: Not on file   Number of children: Not on file   Years of education: Not on file   Highest education level: Not on file  Occupational History   Not on file  Tobacco Use   Smoking status: Never   Smokeless tobacco: Never  Vaping Use   Vaping Use: Never used  Substance and Sexual Activity   Alcohol use: Not Currently   Drug use: Not Currently   Sexual activity: Not Currently  Other Topics Concern   Not on file  Social History Narrative   Not on file   Social Determinants of Health   Financial Resource Strain: Not on file  Food Insecurity: Not on file  Transportation Needs: Not on file  Physical Activity: Not on file  Stress: Not on file  Social Connections: Not on file  Intimate Partner Violence: Not on file    Past Surgical Hx:  Past Surgical History:  Procedure Laterality Date   ANKLE SURGERY Left 2008   IR CT HEAD LTD  10/05/2021   IR INTRA CRAN STENT  10/01/2021   IR PERCUTANEOUS ART THROMBECTOMY/INFUSION INTRACRANIAL INC DIAG ANGIO  10/01/2021   IR US GUIDE VASC ACCESS RIGHT  10/01/2021   RADIOLOGY WITH ANESTHESIA N/A 10/01/2021   Procedure: IR WITH ANESTHESIA;  Surgeon: Radiologist, Medication, MD;  Location: [facility];  Service: Radiology;  Laterality: N/A;    Past Medical Hx:  Past Medical History:  Diagnosis Date   Hypertension    Stroke ([place])     Family Hx: History reviewed. No pertinent family history.  Vitals:  Blood pressure 136/79, pulse 65, temperature 98.7  F (37.1 C), temperature source Oral, resp. rate 18, height 5\' 3"  (1.6 m), weight 154 lb 8.7 oz (70.1 kg), SpO2 99 %.  Physical Exam (performed by Dr. Merrilee Jansky):  Alert, oriented, in no distress. Abd is non-distended, non tender, no organomegaly. Negative supraclavicular or inguinal lymphadenopathy.  Assessment/Plan: 71 year old with complex adnexal mass found on imaging suspicious for a neoplasm. CEA normal and CA 125 pending.  No evidence of  metastatic disease on imaging. Consider MRI of the pelvis to further evaluate the ovarian mass per Dr. Berline Lopes. She is not an appropriate surgical candidate at this time due to her acute DVT. GYN Oncology will plan to see the patient in the office once discharged.    Dorothyann Gibbs, NP 11/17/2021, 12:55 PM

## 2021-11-17 NOTE — Progress Notes (Unsigned)
Error

## 2021-11-17 NOTE — Progress Notes (Addendum)
°  Transition of Care [facility]) Screening Note   Patient Details  Name: Andrea Williamson Date of Birth: 06-Apr-1951   Transition of Care [facility]) CM/SW Contact:    Jazlynne Milliner, Marjie Skiff, RN Phone Number: 11/17/2021, 10:50 AM    Transition of Care Department [facility]) has reviewed patient and no TOC needs have been identified at this time. We will continue to monitor patient advancement through interdisciplinary progression rounds. If new patient transition needs arise, please place a TOC consult.   30 Day Unplanned Readmission Risk Score    Flowsheet Row Admission (Current) from 11/13/2021 in [facility]  30 Day Unplanned Readmission Risk Score (%) 22.02 Filed at 11/17/2021 0801       This score is the patient's risk of an unplanned readmission within 30 days of being discharged (0 -100%). The score is based on dignosis, age, lab data, medications, orders, and past utilization.   Low:  0-14.9   Medium: 15-21.9   High: 22-29.9   Extreme: 30 and above         Readmission Risk Prevention Plan 11/17/2021  Transportation Screening Complete  PCP or Specialist Appt within 3-5 Days Complete  HRI or [place] Complete  Social Work Consult for [place] Planning/Counseling Complete  Palliative Care Screening Not Applicable  Medication Review Press photographer) Complete  Some recent data might be hidden

## 2021-11-18 ENCOUNTER — Inpatient Hospital Stay (HOSPITAL_COMMUNITY): Payer: Medicare Other

## 2021-11-18 DIAGNOSIS — R42 Dizziness and giddiness: Secondary | ICD-10-CM

## 2021-11-18 LAB — COMPREHENSIVE METABOLIC PANEL
ALT: 34 U/L (ref 0–44)
AST: 31 U/L (ref 15–41)
Albumin: 3.6 g/dL (ref 3.5–5.0)
Alkaline Phosphatase: 69 U/L (ref 38–126)
Anion gap: 6 (ref 5–15)
BUN: 28 mg/dL — ABNORMAL HIGH (ref 8–23)
CO2: 24 mmol/L (ref 22–32)
Calcium: 8.6 mg/dL — ABNORMAL LOW (ref 8.9–10.3)
Chloride: 107 mmol/L (ref 98–111)
Creatinine, Ser: 0.82 mg/dL (ref 0.44–1.00)
GFR, Estimated: >60 mL/min (ref >60)
Glucose, Bld: 87 mg/dL (ref 70–99)
Potassium: 3.8 mmol/L (ref 3.5–5.1)
Sodium: 137 mmol/L (ref 135–145)
Total Bilirubin: 2.5 mg/dL — ABNORMAL HIGH (ref 0.3–1.2)
Total Protein: 6.2 g/dL — ABNORMAL LOW (ref 6.5–8.1)

## 2021-11-18 LAB — RETICULOCYTES
Immature Retic Fract: 46.2 % — ABNORMAL HIGH (ref 2.3–15.9)
RBC.: 1.78 MIL/uL — ABNORMAL LOW (ref 3.87–5.11)
Retic Count, Absolute: 517.6 10*3/uL — ABNORMAL HIGH (ref 19.0–186.0)
Retic Ct Pct: 29.1 % — ABNORMAL HIGH (ref 0.4–3.1)

## 2021-11-18 LAB — PREPARE RBC (CROSSMATCH)

## 2021-11-18 LAB — PATHOLOGIST SMEAR REVIEW: Path Review: INCREASED

## 2021-11-18 LAB — LACTATE DEHYDROGENASE: LDH: 732 U/L — ABNORMAL HIGH (ref 98–192)

## 2021-11-18 LAB — CBC
HCT: 22.9 % — ABNORMAL LOW (ref 36.0–46.0)
Hemoglobin: 7 g/dL — ABNORMAL LOW (ref 12.0–15.0)
MCH: 38.7 pg — ABNORMAL HIGH (ref 26.0–34.0)
MCHC: 30.6 g/dL (ref 30.0–36.0)
MCV: 126.5 fL — ABNORMAL HIGH (ref 80.0–100.0)
Platelets: 247 10*3/uL (ref 150–400)
RBC: 1.81 MIL/uL — ABNORMAL LOW (ref 3.87–5.11)
WBC: 19.3 10*3/uL — ABNORMAL HIGH (ref 4.0–10.5)
nRBC: 19.3 % — ABNORMAL HIGH (ref 0.0–0.2)

## 2021-11-18 LAB — HEPARIN LEVEL (UNFRACTIONATED): Heparin Unfractionated: 0.35 IU/mL (ref 0.30–0.70)

## 2021-11-18 LAB — CA 125: Cancer Antigen (CA) 125: 21.5 U/mL (ref 0.0–38.1)

## 2021-11-18 IMAGING — MR MR PELVIS WO/W CM
19 of 20 series · 46 of 48 positions shown · IV contrast (GADAVIST)
Comparison: Ultrasound and CTs of [DATE].

CLINICAL DATA: Right adnexal mass on prior ultrasound.

EXAM:
MRI PELVIS WITHOUT AND WITH CONTRAST
TECHNIQUE: Multiplanar multisequence MR imaging of the pelvis was performed
both before and after administration of intravenous contrast.
CONTRAST:  7mL GADAVIST GADOBUTROL 1 MMOL/ML IV SOLN

[Series 2: T2 · coronal · 6.0mm · 1.56mm/px · 2 of 30 slices shown (1 of 4)]
[im 1/30]
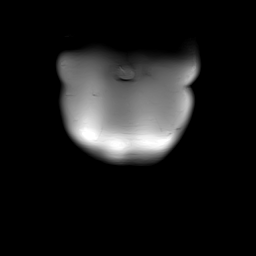
[im 30/30]
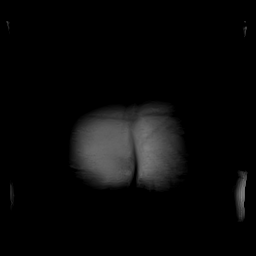

[Series 3: T2 · axial · 5.0mm · 0.51mm/px · z∈[-71,+127]mm · 2 of 34 slices shown (2 of 4)]
[im 1/34]
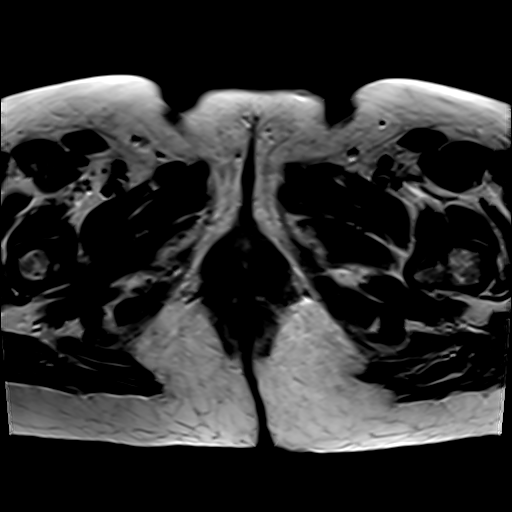
[im 34/34]
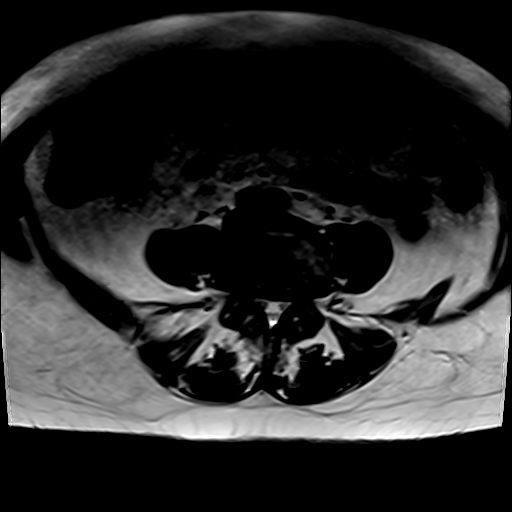

[Series 4: T2 fat-sat · axial · 5.0mm · 0.51mm/px · 1 of 34 slices shown]
[im 1/34]
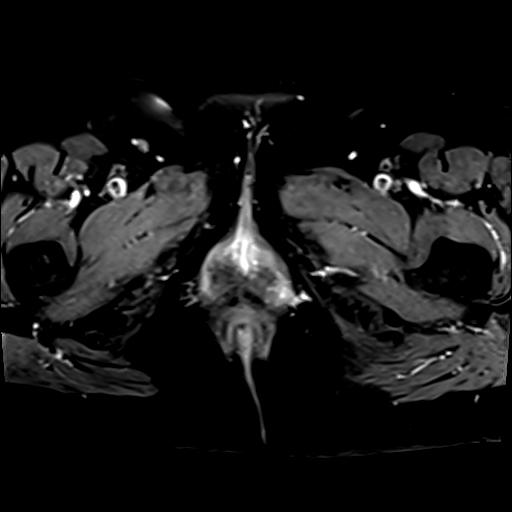

[Series 5: T2 · coronal · 4.0mm · 0.51mm/px · 1 of 34 slices shown (3 of 4)]
[im 1/34]
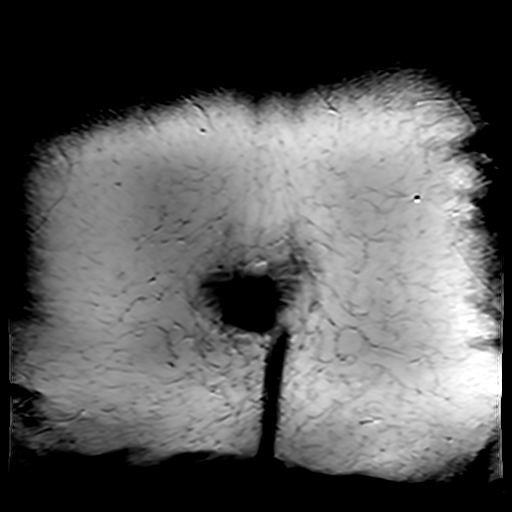

[Series 6: T2 · sagittal · 5.0mm · 0.55mm/px · 2 of 40 slices shown (4 of 4)]
[im 1/40]
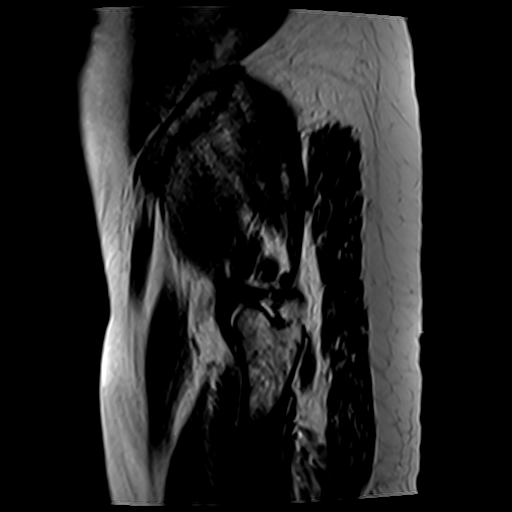
[im 40/40]
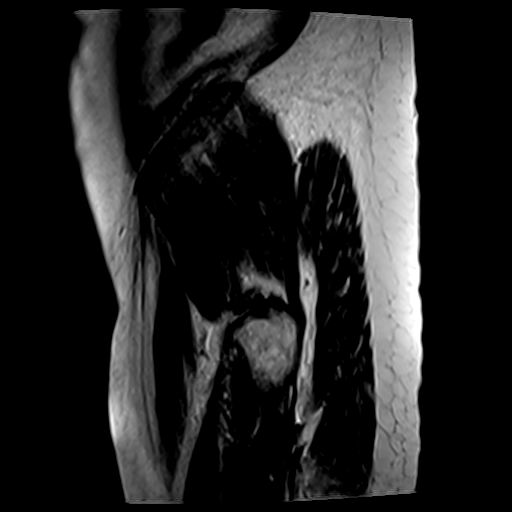

[Series 7: T1 · axial · 4.0mm · 0.84mm/px · z∈[-101,+215]mm · 3 of 80 slices shown (1 of 2)]
[im 1/80]
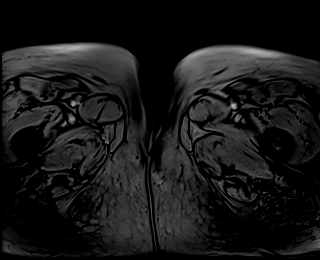
[im 40/80]
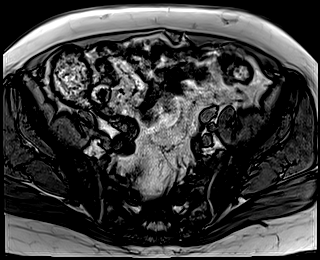
[im 80/80]
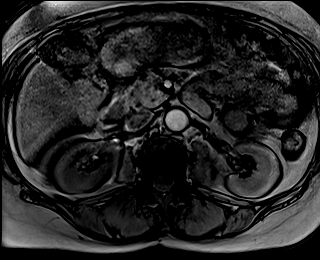

[Series 8: T1 · axial · 4.0mm · 0.84mm/px · z∈[-101,+215]mm · 3 of 80 slices shown (2 of 2)]
[im 1/80]
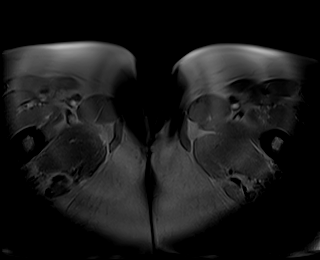
[im 40/80]
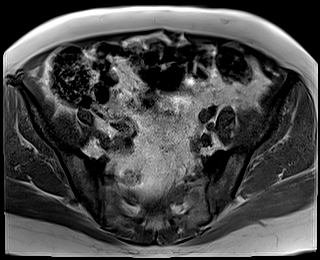
[im 80/80]
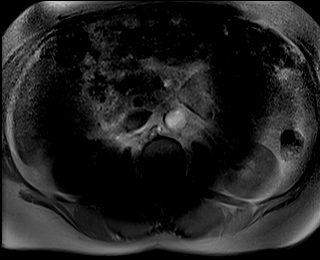

[Series 9: DWI · axial · 5.5mm · 2.80mm/px · z∈[-102,+58]mm · 3 of 90 slices shown (1 of 3)]
[im 1/90]
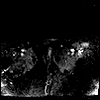
[im 45/90]
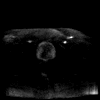
[im 90/90]
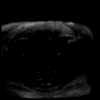

[Series 10: DWI · axial · 5.5mm · 2.80mm/px · 1 of 30 slices shown (2 of 3)]
[im 1/30]
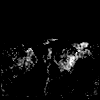

[Series 11: DWI · axial · 5.5mm · 2.80mm/px · 1 of 30 slices shown (3 of 3)]
[im 1/30]
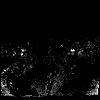

[Series 13: T1 dynamic · axial · 3.0mm · 0.84mm/px · z∈[-88,+149]mm · 3 of 80 slices shown (1 of 7)]
[im 1/80]
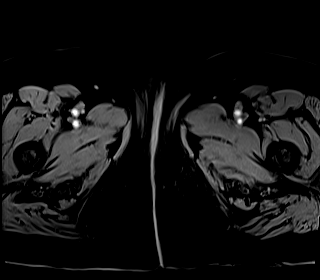
[im 40/80]
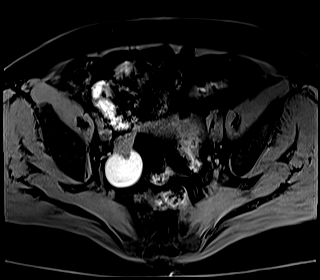
[im 80/80]
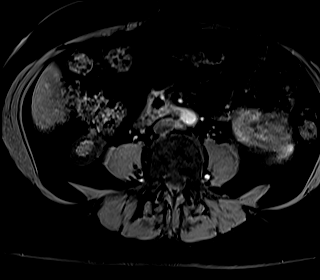

[Series 17: T1 dynamic · axial · 3.0mm · 0.84mm/px · z∈[-88,+149]mm · 3 of 80 slices shown (2 of 7)]
[im 1/80]
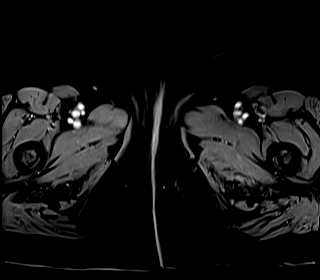
[im 40/80]
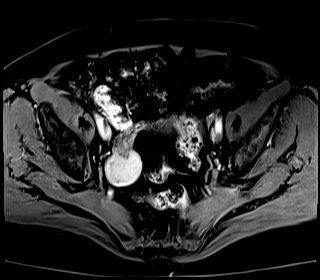
[im 80/80]
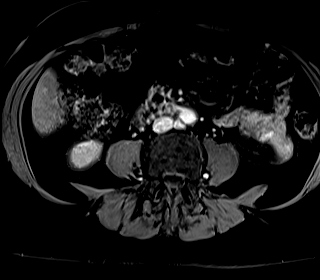

[Series 18: T1 dynamic · axial · 3.0mm · 0.84mm/px · z∈[-88,+149]mm · 3 of 80 slices shown (3 of 7)]
[im 1/80]
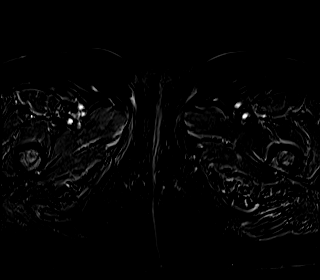
[im 40/80]
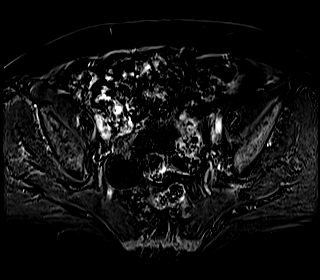
[im 80/80]
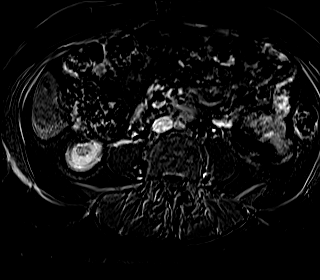

[Series 21: T1 dynamic · axial · 3.0mm · 0.84mm/px · z∈[-88,+149]mm · 3 of 80 slices shown (4 of 7)]
[im 1/80]
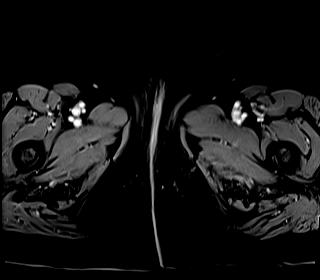
[im 40/80]
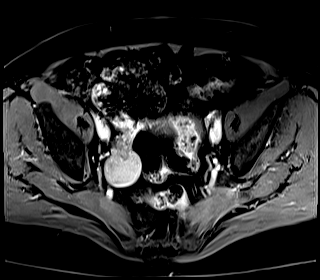
[im 80/80]
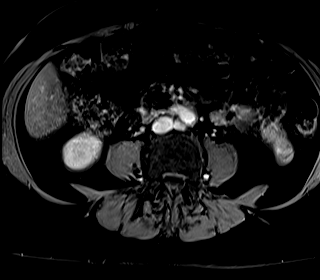

[Series 22: T1 dynamic · axial · 3.0mm · 0.84mm/px · z∈[-88,+149]mm · 3 of 80 slices shown (5 of 7)]
[im 1/80]
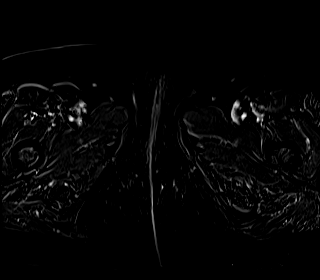
[im 40/80]
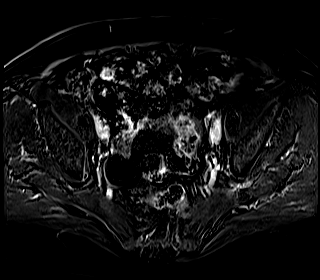
[im 80/80]
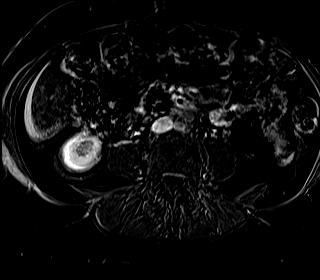

[Series 25: T1 dynamic · axial · 3.0mm · 0.84mm/px · z∈[-88,+149]mm · 3 of 80 slices shown (6 of 7)]
[im 1/80]
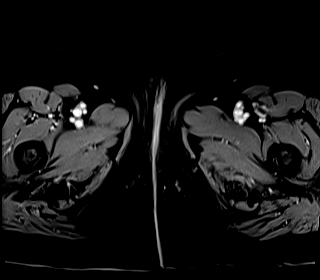
[im 40/80]
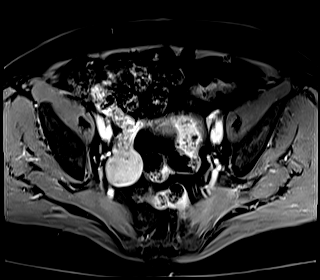
[im 80/80]
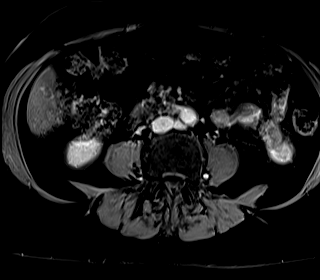

[Series 26: T1 dynamic · axial · 3.0mm · 0.84mm/px · z∈[-88,+149]mm · 3 of 80 slices shown (7 of 7)]
[im 1/80]
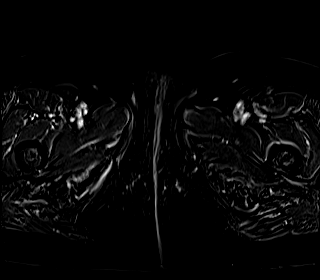
[im 40/80]
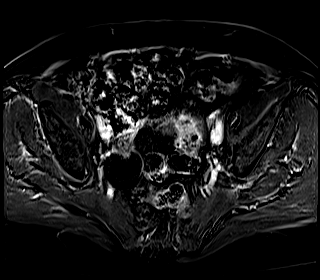
[im 80/80]
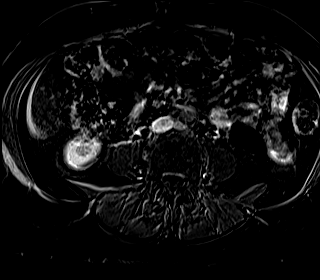

[Series 28: T1 dynamic post-contrast · axial · 3.0mm · 1.06mm/px · z∈[-85,+152]mm · 3 of 80 slices shown (1 of 2)]
[im 1/80]
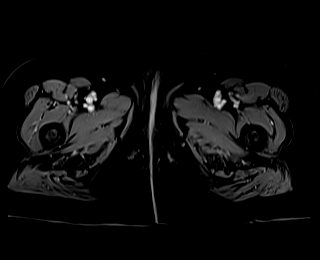
[im 40/80]
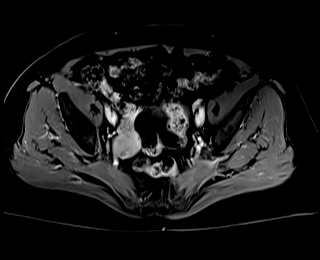
[im 80/80]
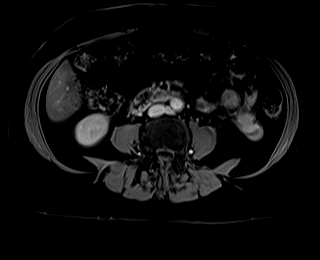

[Series 30: T1 dynamic post-contrast · sagittal · 3.0mm · 0.88mm/px · 3 of 80 slices shown (2 of 2)]
[im 1/80]
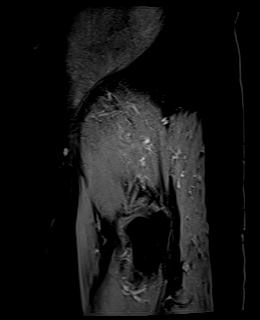
[im 40/80]
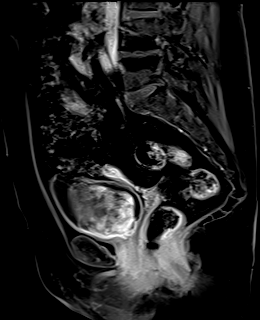
[im 80/80]
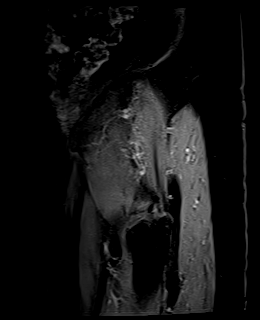

[46 of 48 positions shown; findings below may reference images not displayed]

FINDINGS: Urinary Tract: Normal urinary bladder. No distal hydroureter. Normal
imaged kidneys.

Bowel: Normal pelvic bowel loops.

Vascular/Lymphatic: Aortic atherosclerosis. No pelvic aneurysm or
sidewall adenopathy.

Reproductive:

Uterus: Measures 5.6 x 4.3 by 6.7 cm (volume = 84 cm^3).
Demonstrates a T2 hypointense left-sided intramural lesion which is
most consistent with a fibroid. This avidly enhances.

No endometrial thickening.

Right ovary: Corresponding to the ultrasound abnormality, a complex
cystic lesion within the right ovary demonstrates T2 hyperintensity
and T1 hyperintensity at 2.9 x 3.0 cm on [DATE]. Anteriorly, there is
a 1.0 cm enhancing soft tissue component on 42/26 which corresponds
to the ultrasound area of vascularity. The remaining more cephalad
and anterior soft tissue signal including at 1.7 x 2.6 cm on 38/26
is favored to represent normal ovarian parenchyma.

Left ovary:  Normal, including on [DATE].

Other: No significant free fluid.

Musculoskeletal: Lumbosacral spondylosis, including discogenic edema
at L4-5.

A T2 hyperintense right iliac lesion of 2.0 cm on [DATE] is
consistent with a hemangioma.
IMPRESSION: 1. Complex solid and cystic 2.9 x 3.0 cm right ovarian mass which
(especially given the ultrasound appearance) is highly suspicious
for cystic neoplasm. Consider gynecological oncology consultation.
2. Left-sided uterine fibroid.

## 2021-11-18 MED ORDER — GADOBUTROL 1 MMOL/ML IV SOLN
7.0000 mL | Freq: Once | INTRAVENOUS | Status: AC | PRN
  Administered 2021-11-18: 7 mL via INTRAVENOUS

## 2021-11-18 MED ORDER — PREDNISONE 20 MG PO TABS
80.0000 mg | ORAL_TABLET | Freq: Every day | ORAL | Status: DC
  Administered 2021-11-19 – 2021-11-20 (×2): 80 mg via ORAL
  Filled 2021-11-18 (×2): qty 4

## 2021-11-18 MED ORDER — SODIUM CHLORIDE 0.9 % IV SOLN: INTRAVENOUS | Status: AC

## 2021-11-18 MED ORDER — APIXABAN 5 MG PO TABS
5.0000 mg | ORAL_TABLET | Freq: Two times a day (BID) | ORAL | Status: DC
  Administered 2021-11-18 – 2021-11-20 (×4): 5 mg via ORAL
  Filled 2021-11-18 (×4): qty 1

## 2021-11-18 NOTE — Progress Notes (Signed)
GYN Oncology Progress Note  Patient reports feeling well this am. No nausea, emesis, pain. Voiding without difficulty. Having bowel movements and passing flatus. She is frustrated because she states she has not needed medical care for a long time and now everything seems to be happening. She cares for her husband who has terminal cancer who is unable to speak so she is worried about him. She states both her parents lived into their 75s and did not have regular medical care as well. She reports dizziness when first getting out of bed but this resolves. When ambulating in the halls, she tolerates this well with fatigue occurring at the end of her walk. No concerns or needs voiced.   Advised patient of CA 125 results and the recommendation to follow up in the outpatient setting for her right adnexal mass found on CT imaging with GYN ONC when out of acute DVT phase/initial management. She is not a surgical candidate at this time given acute DVTs. Continue plan of care per Medical Oncology and Hospitalist Team.

## 2021-11-18 NOTE — Progress Notes (Signed)
PROGRESS NOTE  Andrea Williamson SWF:[id number] DOB: 04-07-1951 DOA: 11/13/2021 PCP: Pcp, No   LOS: 5 days   Brief narrative: Andrea Williamson is a 71 y.o. 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  In the ED, patient was noted to have hemoglobin of 4.5 which was a significant drop from hemoglobin of 13 previous months.  Bilirubin was elevated at 6.1 as well.  Hematology was consulted and hemolytic anemia was considered with LDH elevated at 734, patient was given 2 units of packed RBC and started on systemic steroids.  In view of possible need of further reduction patient was a transfer to [facility] long hospital.  She also had a CT scan of the abdomen and pelvis which showed a right adnexal mass around 1.4 cm suspicious for possible malignancy.  She was also noted to have bilateral lower extremity edema and ultrasound showed DVT.  Patient was then started on IV heparin drip as well.    Assessment/Plan:  Principal Problem:   Hemolytic anemia (HCC) Active Problems:   Stroke (cerebrum) (HCC)   Essential hypertension   DVT (deep venous thrombosis) (HCC)   Adnexal mass  Warm autoimmune hemolytic anemia  Patient presented with dizziness.  Initial hemoglobin low at 4.5.  Received PRBC transfusion.  Received rituximab as per oncology on 11/16/2021.  Continue IV Solu-Medrol.  Changed to prednisone from 11/19/2021 by oncology.[name]  HIV, hepatitis B and C nonreactive.  PNH and flow cytometry pending.  Hemoglobin has remained stable at this time.  LDH trending down, today at 732 from  938< 977.  Bilirubin has trended down as well.  We will continue to monitor.  Oncology planning to transition to oral prednisone after today's dose of IV Solu-Medrol.  CBC Latest Ref Rng & Units 11/18/2021 11/17/2021 11/16/2021  WBC 4.0 - 10.5 K/uL 19.3(H)  20.6(H) 27.6(H)  Hemoglobin 12.0 - 15.0 g/dL 7.0(L) 7.3(L) 7.3(L)  Hematocrit 36.0 - 46.0 % 22.9(L) 22.6(L) 22.1(L)  Platelets 150 - 400 K/uL 247 260 263     Bilateral lower extremity DVT Currently on heparin drip.  Oncology on board.  Checked with Dr. Malachy Mood today.  Will transition to Eliquis starting today.   Right adnexal mass  CT chest of the abdomen pelvis showing adnexal mass.  Pelvic ultrasound showing complex lesion with echogenicity around 1.4 cm with internal vascularity.  Possibility of malignancy.  CA125 at 24, within normal range.  CEA at 2.0.  Seen by GYN oncology who recommended MRI of the pelvis for further work-up.  MRI of the pelvis has been ordered, pending.  Gynecology however has recommended that she is not a surgical candidate at this time due to acute DVT and the plan is to follow-up with the GYN oncology as outpatient.  Hx of recent CVA s/p thrombectomy and stent  Left ACA CVA d/t A2 occlusion s/p thrombectomy and stent on 10/01/21.  Patient was on aspirin and Brilinta .  Was seen by neurology who recommended to hold aspirin and brillinta if the patient will be on anticoagulation with heparin.  Dizziness.  Patient does have some orthostatic symptoms.  We will put elastic stockings.  Continue with IV fluids for 1 more day.  Check orthostatic vitals every shift.  Spoke with the nursing staff about it.   Hypertension  Continue Norvasc.  Blood pressure seems to be stable at  this time.  Hyperlipidemia  Continue Lipitor.  Anxiety Improved with increased dose of Xanax and Ambien.   Deconditioning, debility. Will need outpatient PT.  DVT prophylaxis:    On heparin drip.  Will transition to Eliquis today.  Disposition likely home in 1 to 2 days as per oncology recommendations  Code Status: Full code  Family Communication:  I again spoke with the patients' daughter on the phone and updated her about the clinical condition and the potential for disposition home in 1  to 2 days.   Status is: Inpatient  Remains inpatient appropriate because: Hemolytic anemia, right adnexal mass,  status post rituximab injection, IV steroids.  Consultants: Oncology GYN oncology   Procedures: PRBC transfusion.  Anti-infectives:  None  Anti-infectives (From admission, onward)    None      Subjective: Today, patient was seen and examined at bedside.  Patient complains of dizziness while ambulating which improves on sitting down.  Denies any nausea vomiting or diarrhea.  No other symptoms including chest pain or shortness of breath.   Objective: Vitals:   11/17/21 2137 11/18/21 0534  BP: (!) 165/76 (!) 165/84  Pulse: 76 (!) 57  Resp: 18 18  Temp: 98 F (36.7 C) 98 F (36.7 C)  SpO2: 97% 97%    Intake/Output Summary (Last 24 hours) at 11/18/2021 0840 Last data filed at 11/17/2021 0900 Gross per 24 hour  Intake 60 ml  Output --  Net 60 ml    Filed Weights   11/15/21 1445  Weight: 70.1 kg   Body mass index is 27.38 kg/m.   Physical Exam: General:  Average built, not in obvious distress HENT: Mild pallor noted.. Oral mucosa is moist.  Chest:  Clear breath sounds.  Diminished breath sounds bilaterally. No crackles or wheezes.  CVS: S1 &S2 heard. No murmur.  Regular rate and rhythm. Abdomen: Soft, nontender, nondistended.  Bowel sounds are heard.   Extremities: No cyanosis, clubbing or edema.  Peripheral pulses are palpable. Psych: Alert, awake and oriented, normal mood CNS:  No cranial nerve deficits.  Power equal in all extremities.   Skin: Warm and dry.  No rashes noted.   Data Review: I have personally reviewed the following laboratory data and studies,  CBC: Recent Labs  Lab 11/13/21 0019 11/13/21 1508 11/14/21 0154 11/15/21 0552 11/16/21 0618 11/17/21 0454 11/18/21 0529  WBC 21.4*  --  28.3* 25.9* 27.6* 20.6* 19.3*  NEUTROABS 18.1*  --   --   --   --   --   --   HGB 5.0*   < > 8.0* 7.9* 7.3* 7.3* 7.0*  HCT 15.1*  --  23.2*  23.8* 22.1* 22.6* 22.9*  MCV 111.0*  --  100.9* 108.7* 111.6* 117.7* 126.5*  PLT 348  --  282 267 263 260 247   < > = values in this interval not displayed.    Basic Metabolic Panel: Recent Labs  Lab 11/14/21 0154 11/15/21 0552 11/16/21 0618 11/17/21 0454 11/18/21 0529  NA 135 137 138 141 137  K 3.0* 3.3* 3.5 3.7 3.8  CL 101 105 108 106 107  CO2 24 23 23 25 24   GLUCOSE 104* 101* 90 106* 87  BUN 32* 43* 46* 34* 28*  CREATININE 1.10* 0.91 1.02* 0.97 0.82  CALCIUM 9.3 9.3 8.9 8.8* 8.6*  MG 2.3  --   --   --   --     Liver Function Tests: Recent Labs  Lab 11/14/21 0154 11/15/21 0552 11/16/21 0102  11/17/21 0454 11/18/21 0529  AST 41 33 26 27 31   ALT 17 18 18 22  34  ALKPHOS 95 88 82 80 69  BILITOT 5.0* 2.8* 2.2* 2.8* 2.5*  PROT 6.9 7.0 6.4* 6.5 6.2*  ALBUMIN 3.6 3.8 3.6 3.6 3.6    Recent Labs  Lab 11/12/21 2142  LIPASE 37    No results for input(s): AMMONIA in the last 168 hours. Cardiac Enzymes: No results for input(s): CKTOTAL, CKMB, CKMBINDEX, TROPONINI in the last 168 hours. BNP (last 3 results) No results for input(s): BNP in the last 8760 hours.  ProBNP (last 3 results) No results for input(s): PROBNP in the last 8760 hours.  CBG: No results for input(s): GLUCAP in the last 168 hours. Recent Results (from the past 240 hour(s))  Resp Panel by RT-PCR (Flu A&B, Covid) Nasopharyngeal Swab     Status: None   Collection Time: 11/12/21  9:42 PM   Specimen: Nasopharyngeal Swab; Nasopharyngeal(NP) swabs in vial transport medium  Result Value Ref Range Status   SARS Coronavirus 2 by RT PCR NEGATIVE NEGATIVE Final    Comment: (NOTE) SARS-CoV-2 target nucleic acids are NOT DETECTED.  The SARS-CoV-2 RNA is generally detectable in upper respiratory specimens during the acute phase of infection. The lowest concentration of SARS-CoV-2 viral copies this assay can detect is 138 copies/mL. A negative result does not preclude SARS-Cov-2 infection and should not be  used as the sole basis for treatment or other patient management decisions. A negative result may occur with  improper specimen collection/handling, submission of specimen other than nasopharyngeal swab, presence of viral mutation(s) within the areas targeted by this assay, and inadequate number of viral copies(<138 copies/mL). A negative result must be combined with clinical observations, patient history, and epidemiological information. The expected result is Negative.  Fact Sheet for Patients:  EntrepreneurPulse.com.au  Fact Sheet for Healthcare Providers:  IncredibleEmployment.be  This test is no t yet approved or cleared by the [place] FDA and  has been authorized for detection and/or diagnosis of SARS-CoV-2 by FDA under an Emergency Use Authorization (EUA). This EUA will remain  in effect (meaning this test can be used) for the duration of the COVID-19 declaration under Section 564(b)(1) of the Act, 21 U.S.C.section 360bbb-3(b)(1), unless the authorization is terminated  or revoked sooner.       Influenza A by PCR NEGATIVE NEGATIVE Final   Influenza B by PCR NEGATIVE NEGATIVE Final    Comment: (NOTE) The Xpert Xpress SARS-CoV-2/FLU/RSV plus assay is intended as an aid in the diagnosis of influenza from Nasopharyngeal swab specimens and should not be used as a sole basis for treatment. Nasal washings and aspirates are unacceptable for Xpert Xpress SARS-CoV-2/FLU/RSV testing.  Fact Sheet for Patients: EntrepreneurPulse.com.au  Fact Sheet for Healthcare Providers: IncredibleEmployment.be  This test is not yet approved or cleared by the [place] FDA and has been authorized for detection and/or diagnosis of SARS-CoV-2 by FDA under an Emergency Use Authorization (EUA). This EUA will remain in effect (meaning this test can be used) for the duration of the COVID-19 declaration under Section  564(b)(1) of the Act, 21 U.S.C. section 360bbb-3(b)(1), unless the authorization is terminated or revoked.  Performed at [facility], [address], [place], [place] [address]       Studies: No results found.   Flora Lipps, MD  [facility] 11/18/2021  If 7PM-7AM, please contact night-coverage

## 2021-11-18 NOTE — Progress Notes (Addendum)
[facility] for heparin >> Eliquis Indication: acute DVT  No Known Allergies  Patient Measurements: Height: 5\' 3"  (160 cm) Weight: 70.1 kg (154 lb 8.7 oz) IBW/kg (Calculated) : 52.4 Heparin Dosing Weight: 66 kg  Vital Signs: Temp: 98.6 F (37 C) (01/04 1432) Temp Source: Oral (01/04 1432) BP: 144/75 (01/04 1432) Pulse Rate: 68 (01/04 1432)  Labs: Recent Labs    11/16/21 0618 11/17/21 0454 11/18/21 0529  HGB 7.3* 7.3* 7.0*  HCT 22.1* 22.6* 22.9*  PLT 263 260 247  HEPARINUNFRC 0.42 0.39 0.35  CREATININE 1.02* 0.97 0.82    Estimated Creatinine Clearance: 60 mL/min (by C-G formula based on SCr of 0.82 mg/dL).  Assessment: Patient is a 71 y.o F with hx CVA in Nov 2022 (s/p thrombectomy and stent placement) and was placed on Brilinta and asa after the procedure. She presented to the ED on 11/12/21 with c/o n/v and dizziness and was found to have a hgb of 4.5.  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  LE doppler on 12/30 came back with bilateral DVT.  She was initially started on LMWH, but this was changed to heparin drip on 12/31 in case anticoagulation needs to be stopped quickly.  Today, 11/18/2021: No new labs since AM Per Heme/Onc, ok to transition to Eliquis today; will get Gyn/Onc follow-up as outpatient   Plan:  Start Eliquis 5 mg PO bid at 1600 today; stop heparin with first dose of Eliquis Eliquis dosing discussed with MD; will omit high-dose Eliquis (10 mg bid x 7 days, followed by 5 mg bid thereafter) as patient has completed 4 days of stable/therapeutic heparin and has severe hemolytic anemia Pharmacy will provide Eliquis counseling and coupons prior to discharge  Reuel Boom, PharmD, BCPS [phone] 11/18/2021, 3:39 PM

## 2021-11-18 NOTE — Progress Notes (Signed)
IP PROGRESS NOTE  Subjective:   She has no new complaint.  Objective: Vital signs in last 24 hours: Blood pressure (!) 165/84, pulse (!) 57, temperature 98 F (36.7 C), temperature source Oral, resp. rate 18, height 5\' 3"  (1.6 m), weight 154 lb 8.7 oz (70.1 kg), SpO2 97 %.  Intake/Output from previous day: 01/03 0701 - 01/04 0700 In: 60 [P.O.:60] Out: -   Physical Exam:  HEENT: Mild scleral icterus, no thrush Lungs: Clear bilaterally, no respiratory distress Cardiac: Regular rate and rhythm, occasional premature beat Abdomen: No hepatosplenomegaly, no mass, nontender Extremities: No leg edema   Lab Results: Recent Labs    11/17/21 0454 11/18/21 0529  WBC 20.6* 19.3*  HGB 7.3* 7.0*  HCT 22.6* 22.9*  PLT 260 247   Review of peripheral blood smear from 11/15/2021: The platelets appear normal in number, the majority of the white cells are mature neutrophils, few myelocytes and metamyelocytes, no blasts.  The polychromasia is markedly increased.  Numerous nucleated red cells, few earlier Red cell precursors.  Spherocytes. BMET Recent Labs    11/17/21 0454 11/18/21 0529  NA 141 137  K 3.7 3.8  CL 106 107  CO2 25 24  GLUCOSE 106* 87  BUN 34* 28*  CREATININE 0.97 0.82  CALCIUM 8.8* 8.6*   Bilirubin 2.5, LDH 732  Medications: I have reviewed the patient's current medications.  Assessment/Plan: Autoimmune hemolytic anemia, transfused packed red blood cells on 11/14/2019, prednisone 11/13/2021, Solu-Medrol started 11/14/2021 Rituximab 11/16/2021 Bilateral lower extremity deep vein thrombosis-Heparin CVA November 2022, treated with aspirin and Brilinta-placed on hold with initiation of heparin Hypertension Hyperlipidemia Right adnexal mass cystic/solid   Andrea Williamson appears stable.  The hemoglobin is stable and the LDH/bilirubin are lower today.  We will change to prednisone after today's dose of Solu-Medrol. She saw GYN oncology.  Outpatient follow-up is being  scheduled.  The CA125 is normal.  On She appears stable to convert to an oral anticoagulant.  She will be ready for discharge within the next 1-2 days if the hemoglobin remained stable.  Recommendations: Discontinue Solu-Medrol after today's dose and begin prednisone Convert to an oral anticoagulant Increase ambulation Begin discharge planning for 11/19/2021 or 11/20/2021   LOS: 5 days   Betsy Coder, MD   11/18/2021, 11:58 AM

## 2021-11-18 NOTE — Progress Notes (Signed)
Physical Therapy Treatment Patient Details Name: Andrea Williamson MRN: [id number] DOB: 05-20-1951 Today's Date: 11/18/2021   History of Present Illness 71yo female who presented to [facility] on 12/29 with dizziness, nausea, vomiting, found to be anemic with Hgb at 4.5, also found to have B DVT which was treated with Heparin infusion. Received 2 units PRBC, then was transferred to [facility] due to concern for autoimmune hemolytic anemia. PMH HTN, CVA, ankle sx    PT Comments    Patient eager to mobilize with therapy. Ambulating well with no device and instructed on management of IV pole to stabilize balance. Patient with increased weakness on Lt LE today and mildly unsteady with drifting in hallway but able to steady self with IV pole. Pt will continue to benefit from skilled PT interventions to progress mobility, recommend continue with OOPT when medically ready for discharge.   Recommendations for follow up therapy are one component of a multi-disciplinary discharge planning process, led by the attending physician.  Recommendations may be updated based on patient status, additional functional criteria and insurance authorization.  Follow Up Recommendations  Outpatient PT     Assistance Recommended at Discharge PRN  Patient can return home with the following     Equipment Recommendations  Rollator (4 wheels)    Recommendations for Other Services       Precautions / Restrictions Precautions Precautions: Fall;Other (comment) Precaution Comments: watch BP- mildly orthostatic Restrictions Weight Bearing Restrictions: No     Mobility  Bed Mobility Overal bed mobility: Modified Independent             General bed mobility comments: HOB elevated, increased time and effort    Transfers Overall transfer level: Needs assistance Equipment used: None Transfers: Sit to/from Stand Sit to Stand: Supervision           General transfer comment: supervision for safety/lines     Ambulation/Gait Ambulation/Gait assistance: Min guard;Supervision Gait Distance (Feet): 360 Feet Assistive device: IV Pole Gait Pattern/deviations: Drifts right/left;Step-through pattern;Decreased step length - left;Decreased stance time - left Gait velocity: decreased     General Gait Details: management of IV pole, pt with single UE support to guide pole. Drifts Rt mostly during gait and pt with reduce Lt LE step length and stance time.   Stairs             Wheelchair Mobility    Modified Rankin (Stroke Patients Only)       Balance Overall balance assessment: Needs assistance Sitting-balance support: Feet supported;No upper extremity supported Sitting balance-Leahy Scale: Good     Standing balance support: No upper extremity supported;During functional activity Standing balance-Leahy Scale: Fair                              Cognition Arousal/Alertness: Awake/alert Behavior During Therapy: WFL for tasks assessed/performed Overall Cognitive Status: Within Functional Limits for tasks assessed                                 General Comments: impaired STM, pt aware and asks that staff call daughter in law tiffany with any updates. Unclear if this is new or since recent CVA vs chronic, etc        Exercises Other Exercises Other Exercises: 15 reps sit<>stand from EOB, no UE use Other Exercises: 3 way hip Rt LE (4 reps completed) limited by pt c/o increased dizziness/lightheadedness.  General Comments        Pertinent Vitals/Pain Pain Assessment: No/denies pain    Home Living                          Prior Function            PT Goals (current goals can now be found in the care plan section) Acute Rehab PT Goals Patient Stated Goal: feel better, live another 20 years PT Goal Formulation: With patient Time For Goal Achievement: 11/30/21 Potential to Achieve Goals: Good Progress towards PT goals: Progressing  toward goals    Frequency    Min 3X/week      PT Plan Current plan remains appropriate    Co-evaluation              AM-PAC PT "6 Clicks" Mobility   Outcome Measure  Help needed turning from your back to your side while in a flat bed without using bedrails?: None Help needed moving from lying on your back to sitting on the side of a flat bed without using bedrails?: None Help needed moving to and from a bed to a chair (including a wheelchair)?: A Little Help needed standing up from a chair using your arms (e.g., wheelchair or bedside chair)?: A Little Help needed to walk in hospital room?: A Little Help needed climbing 3-5 steps with a railing? : A Little 6 Click Score: 20    End of Session   Activity Tolerance: Patient tolerated treatment well Patient left: in chair;with call bell/phone within reach Nurse Communication: Mobility status PT Visit Diagnosis: Unsteadiness on feet (R26.81);Muscle weakness (generalized) (M62.81)     Time: 1024-1050 PT Time Calculation (min) (ACUTE ONLY): 26 min  Charges:  $Gait Training: 8-22 mins $Therapeutic Exercise: 8-22 mins                     Verner Mould, DPT Acute Rehabilitation Services Office [phone] Pager [phone]    Jacques Navy 11/18/2021, 12:31 PM

## 2021-11-18 NOTE — Progress Notes (Addendum)
ANTICOAGULATION CONSULT NOTE   Pharmacy Consult for heparin  Indication: acute DVT  No Known Allergies  Patient Measurements: Height: 5\' 3"  (160 cm) Weight: 70.1 kg (154 lb 8.7 oz) IBW/kg (Calculated) : 52.4 Heparin Dosing Weight: 66 kg  Vital Signs: Temp: 98 F (36.7 C) (01/04 0534) Temp Source: Oral (01/04 0534) BP: 165/84 (01/04 0534) Pulse Rate: 57 (01/04 0534)  Labs: Recent Labs    11/16/21 0618 11/17/21 0454 11/18/21 0529  HGB 7.3* 7.3* 7.0*  HCT 22.1* 22.6* 22.9*  PLT 263 260 247  HEPARINUNFRC 0.42 0.39 0.35  CREATININE 1.02* 0.97 0.82    Estimated Creatinine Clearance: 60 mL/min (by C-G formula based on SCr of 0.82 mg/dL).  Assessment: Patient is a 71 y.o F with hx CVA in Nov 2022 (s/p thrombectomy and stent placement) and was placed on Brilinta and asa after the procedure. She presented to the ED on 11/12/21 with c/o n/v and dizziness and was found to have a hgb of 4.5.  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  LE doppler on 12/30 came back with bilateral DVT.  She was initially started on LMWH, but this was changed to heparin drip on 12/31 in case anticoagulation needs to be stopped quickly.  Today, 11/18/2021: -  heparin level therapeutic at 0.35 on heparin 900 units/hr - Hgb 7.0,  plts 247.  - No bleeding documented  Goal of Therapy:  Heparin level 0.3-0.7 units/ml Monitor platelets by anticoagulation protocol: Yes   Plan:  - Continue heparin drip at 900 units/hr - Daily heparin level and cbc - Monitor for s/sx bleeding - Await decision for oral or SQ anti-coagulation per Oncology, concern for possible biopsy of mass.   Minda Ditto PharmD [facility] [phone] 11/18/2021, 7:01 AM

## 2021-11-19 ENCOUNTER — Ambulatory Visit: Payer: Medicare Other

## 2021-11-19 ENCOUNTER — Inpatient Hospital Stay: Payer: Medicare Other | Admitting: Adult Health

## 2021-11-19 LAB — COMPREHENSIVE METABOLIC PANEL
ALT: 68 U/L — ABNORMAL HIGH (ref 0–44)
AST: 48 U/L — ABNORMAL HIGH (ref 15–41)
Albumin: 3.6 g/dL (ref 3.5–5.0)
Alkaline Phosphatase: 70 U/L (ref 38–126)
Anion gap: 6 (ref 5–15)
BUN: 26 mg/dL — ABNORMAL HIGH (ref 8–23)
CO2: 25 mmol/L (ref 22–32)
Calcium: 8.5 mg/dL — ABNORMAL LOW (ref 8.9–10.3)
Chloride: 105 mmol/L (ref 98–111)
Creatinine, Ser: 0.89 mg/dL (ref 0.44–1.00)
GFR, Estimated: >60 mL/min (ref >60)
Glucose, Bld: 78 mg/dL (ref 70–99)
Potassium: 4 mmol/L (ref 3.5–5.1)
Sodium: 136 mmol/L (ref 135–145)
Total Bilirubin: 2.5 mg/dL — ABNORMAL HIGH (ref 0.3–1.2)
Total Protein: 5.8 g/dL — ABNORMAL LOW (ref 6.5–8.1)

## 2021-11-19 LAB — CBC
HCT: 21.5 % — ABNORMAL LOW (ref 36.0–46.0)
Hemoglobin: 6.8 g/dL — CL (ref 12.0–15.0)
MCH: 39.5 pg — ABNORMAL HIGH (ref 26.0–34.0)
MCHC: 31.6 g/dL (ref 30.0–36.0)
MCV: 125 fL — ABNORMAL HIGH (ref 80.0–100.0)
Platelets: 226 10*3/uL (ref 150–400)
RBC: 1.72 MIL/uL — ABNORMAL LOW (ref 3.87–5.11)
WBC: 16.4 10*3/uL — ABNORMAL HIGH (ref 4.0–10.5)
nRBC: 14.9 % — ABNORMAL HIGH (ref 0.0–0.2)

## 2021-11-19 LAB — RETICULOCYTES
Immature Retic Fract: 45.3 % — ABNORMAL HIGH (ref 2.3–15.9)
RBC.: 1.74 MIL/uL — ABNORMAL LOW (ref 3.87–5.11)
Retic Count, Absolute: 411.9 10*3/uL — ABNORMAL HIGH (ref 19.0–186.0)
Retic Ct Pct: 23.7 % — ABNORMAL HIGH (ref 0.4–3.1)

## 2021-11-19 LAB — LACTATE DEHYDROGENASE: LDH: 693 U/L — ABNORMAL HIGH (ref 98–192)

## 2021-11-19 LAB — PNH PROFILE (-HIGH SENSITIVITY)

## 2021-11-19 NOTE — Assessment & Plan Note (Signed)
--  Continue apixaban.  Follow-up as an outpatient.

## 2021-11-19 NOTE — Discharge Instructions (Addendum)
°  Information on my medicine - ELIQUIS (apixaban)  This medication education was reviewed with me or my healthcare representative as part of my discharge preparation.  The pharmacist that spoke with me during my hospital stay was:  Minda Ditto, [facility]  Why was Eliquis prescribed for you? Eliquis was prescribed to treat blood clots that may have been found in the veins of your legs (deep vein thrombosis) or in your lungs (pulmonary embolism) and to reduce the risk of them occurring again.  What do You need to know about Eliquis ? Eliquis dose is ONE 5 mg tablet taken TWICE daily.  Eliquis may be taken with or without food.   Try to take the dose about the same time in the morning and in the evening. If you have difficulty swallowing the tablet whole please discuss with your pharmacist how to take the medication safely.  Take Eliquis exactly as prescribed and DO NOT stop taking Eliquis without talking to the doctor who prescribed the medication.  Stopping may increase your risk of developing a new blood clot.  Refill your prescription before you run out.  After discharge, you should have regular check-up appointments with your healthcare provider that is prescribing your Eliquis.    What do you do if you miss a dose? If a dose of ELIQUIS is not taken at the scheduled time, take it as soon as possible on the same day and twice-daily administration should be resumed. The dose should not be doubled to make up for a missed dose.  Important Safety Information A possible side effect of Eliquis is bleeding. You should call your healthcare provider right away if you experience any of the following: Bleeding from an injury or your nose that does not stop. Unusual colored urine (red or dark brown) or unusual colored stools (red or black). Unusual bruising for unknown reasons. A serious fall or if you hit your head (even if there is no bleeding).  Some medicines may interact with Eliquis and  might increase your risk of bleeding or clotting while on Eliquis. To help avoid this, consult your healthcare provider or pharmacist prior to using any new prescription or non-prescription medications, including herbals, vitamins, non-steroidal anti-inflammatory drugs (NSAIDs) and supplements.  This website has more information on Eliquis (apixaban): http://www.eliquis.com/eliquis/home    Outpatient follow-up will be scheduled at the [facility] cancer center to monitor lab work.  You will follow-up with Dr. Janese Banks.  The office will contact you with the date/time of your appointment.  The office address is [address], [place], [place] [address] and the telephone number to the office is [phone].  Continue prednisone 80 mg daily.  Dr. Janese Banks will adjust your dose at your follow-up appointment.  Continue to take folic acid 1 mg daily.  Continue Eliquis.

## 2021-11-19 NOTE — Progress Notes (Signed)
°   11/19/21 0815  Provider Notification  Provider Name/Title Dr. Sarajane Jews  Date Provider Notified 11/19/21  Time Provider Notified [phone]  Notification Type Page  Notification Reason Critical result (Hgb 6.8)  Provider response Other (Comment) (Dr.Sherrill notified)  Date of Provider Response 11/19/21  Time of Provider Response [phone]

## 2021-11-19 NOTE — Assessment & Plan Note (Signed)
Continue Norvasc

## 2021-11-19 NOTE — Progress Notes (Addendum)
IP PROGRESS NOTE  Subjective:   She has no new complaints this morning.  Denies bleeding.  States that she has been up a little bit and did not notice an significant amount of dizziness.  She reports that she has a poor memory and defers questions to her family which are not present.  Objective: Vital signs in last 24 hours: Blood pressure (!) 165/82, pulse 62, temperature 98.1 F (36.7 C), temperature source Oral, resp. rate 18, height 5\' 3"  (1.6 m), weight 70.1 kg, SpO2 98 %.  Intake/Output from previous day: 01/04 0701 - 01/05 0700 In: 2385.3 [P.O.:720; I.V.:1665.3] Out: -   Physical Exam:  HEENT: Mild scleral icterus, no thrush Lungs: Clear bilaterally, no respiratory distress Cardiac: Regular rate and rhythm, occasional premature beat Abdomen: No hepatosplenomegaly, no mass, nontender Extremities: No leg edema   Lab Results: Recent Labs    11/17/21 0454 11/18/21 0529  WBC 20.6* 19.3*  HGB 7.3* 7.0*  HCT 22.6* 22.9*  PLT 260 247   Review of peripheral blood smear from 11/15/2021: The platelets appear normal in number, the majority of the white cells are mature neutrophils, few myelocytes and metamyelocytes, no blasts.  The polychromasia is markedly increased.  Numerous nucleated red cells, few earlier Red cell precursors.  Spherocytes. BMET Recent Labs    11/18/21 0529 11/19/21 0711  NA 137 136  K 3.8 4.0  CL 107 105  CO2 24 25  GLUCOSE 87 78  BUN 28* 26*  CREATININE 0.82 0.89  CALCIUM 8.6* 8.5*   Bilirubin 2.5, LDH 732  Medications: I have reviewed the patient's current medications.  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  Her hemoglobin is lower today and T bili is stable.  We will add on LDH and reticulocytes.  Recommend holding off on PRBC transfusion at this time.  Recheck labs in a.m. including a CBC, CMET, LDH, Retic.  Continue prednisone.  We will plan to refer the patient back to the [facility] cancer center to follow with Dr. Janese Banks following hospital discharge.  She saw GYN oncology.  Outpatient follow-up is being scheduled.  The CA125 is normal.    Continue oral anticoagulant.  May be stable for discharge tomorrow if labs remain stable.  Recommendations: Continue prednisone Continue Eliquis Add on LDH and reticulocytes today's labs.  Recheck CBC, CMET, LDH, Retic in AM. Increase ambulation Possible discharge 1/6 if hemoglobin remains stable.  We will arrange for outpatient follow-up at [facility] with Dr. Janese Banks.   LOS: 6 days   Mikey Bussing, NP   11/19/2021, 7:57 AM  Andrea Williamson was interviewed and examined.  She appears unchanged.  The hemoglobin is mildly lower over the past 3 to 4 days.  The LDH has improved.  She is now maintained on daily prednisone.  I recommend continuing prednisone.  We will schedule a Red cell transfusion if the hemoglobin is lower tomorrow.  Close outpatient follow-up will be scheduled at the [facility] cancer center.  I was present for greater than 50% of today's visit.  I performed medical decision making.

## 2021-11-19 NOTE — Hospital Course (Addendum)
71 year old woman presented with dizziness nausea and vomiting found to have profound anemia with hemoglobin 4.5 with bilirubin of 6.1, diagnosed with hemolytic anemia and transferred to [facility] for further therapy.    Recommendations: Continue prednisone 80 mg daily - to be tapered as outpatient.  Continue folic acid 1 mg daily. Continue Eliquis 5 mg twice daily Okay for discharge from our standpoint with outpatient follow-up with Dr. Janese Banks on 11/23/2021.  Message has been sent to the [facility] cancer center to arrange appointments and contact the patient with the date/time.  Hx recent CVA s/p thrombectomy with stent placement Patient with recent CVA s/p left A2 thrombectomy with stent placement 10/01/2021 by neuro interventional radiology, Dr. Karenann Cai at [facility].  Discussed with Dr. Norma Fredrickson on 12/30 and given recent stent placement typically keep patients on DAPT for 6 months but when necessity arises can shorten to 3 months but only will discontinue one of the agents before 3 months in very critical cases; his recommendation is to continue at this time; given if these agents are discontinued she is high risk for recurrent stroke. --Continue Brilinta and aspirin 12/30

## 2021-11-19 NOTE — Progress Notes (Signed)
°  Progress Note Patient: Andrea Williamson OIB:[id number] DOB: January 30, 1951 DOA: 11/13/2021     6 DOS: the patient was seen and examined on 11/19/2021   Brief hospital course: 71 year old woman presented with dizziness nausea and vomiting found to have profound anemia with hemoglobin 4.5 with bilirubin of 6.1, diagnosed with hemolytic anemia and transferred to [facility] for further therapy.   Assessment and Plan1 * Hemolytic anemia ([place])- (present on admission) -- Treated with PRBC transfusion, rituximab 1/2, IV Solu-Medrol.  Now on oral prednisone. --Possibly home tomorrow if hemoglobin stable.  Discussed with Dr. Benay Spice.  Adnexal mass- (present on admission) --Seen by Dr. Berline Lopes in GYN oncology, at this point risk of surgical intervention was felt to be too high, we will follow-up with her in 3 months for repeat imaging.  DVT (deep venous thrombosis) ([place])- (present on admission) --Continue apixaban.  Follow-up as an outpatient.    Essential hypertension- (present on admission) --Continue Norvasc.    Stroke (cerebrum) ([place])- (present on admission) --Status post left ACA CVA d/t A2 occlusion s/p thrombectomy and stent on 10/01/21.  Patient was on aspirin and Brilinta . --Was seen by neurology who recommended to hold aspirin and brillinta if the patient will be on anticoagulation with heparin.     Subjective:  Feels fine, eating fine, ambulating fine  Objective Vital signs were reviewed and unremarkable. Physical Exam Vitals reviewed.  Constitutional:      General: She is not in acute distress.    Appearance: She is not ill-appearing or toxic-appearing.  Cardiovascular:     Rate and Rhythm: Normal rate and regular rhythm.     Heart sounds: No murmur heard. Pulmonary:     Effort: Pulmonary effort is normal. No respiratory distress.     Breath sounds: No wheezing, rhonchi or rales.  Neurological:     Mental Status: She is alert.  Psychiatric:        Mood and Affect: Mood normal.         Behavior: Behavior normal.     Data Reviewed:  AST up to 48, ALT up to 68, Tbili stable 2.5, Hgb w/o significant change, 6.8  Family Communication: husband at bedside  Disposition: Status is: Inpatient  Remains inpatient appropriate because: hemolytic anemia, close monitoring         Time spent: 35 minutes  Author: Murray Hodgkins, MD 11/19/2021 6:49 PM  For on call review [email or web address].

## 2021-11-19 NOTE — Progress Notes (Addendum)
GYN Oncology Progress Note  Patient reports feeling well this am. No nausea, emesis, pain. Voiding larger amounts without difficulty. Having bowel movements and passing flatus. Tolerating diet.   MRI results discussed by Dr. Berline Lopes with patient/husband/and husband's daughter with some concerning features on ultrasound and MRI. Continue with recommendation to follow up in the outpatient setting in around one month with plans for repeat imaging in three months to re-evaluate the ovarian mass. She is not a surgical candidate at this time given acute DVTs, anemia, recent stroke. Continue plan of care per Medical Oncology and Hospitalist Team. Reportable signs and symptoms reviewed. All questions answered.

## 2021-11-19 NOTE — Assessment & Plan Note (Signed)
--  Seen by Dr. Berline Lopes in GYN oncology, at this point risk of surgical intervention was felt to be too high, we will follow-up with her in 3 months for repeat imaging.

## 2021-11-19 NOTE — Assessment & Plan Note (Signed)
--   Treated with PRBC transfusion, rituximab 1/2, IV Solu-Medrol.  Now on oral prednisone. --Possibly home tomorrow if hemoglobin stable.  Discussed with Dr. Benay Spice.

## 2021-11-19 NOTE — Assessment & Plan Note (Signed)
--  Status post left ACA CVA d/t A2 occlusion s/p thrombectomy and stent on 10/01/21.  Patient was on aspirin and Brilinta . --Was seen by neurology who recommended to hold aspirin and brillinta if the patient will be on anticoagulation with heparin.

## 2021-11-20 ENCOUNTER — Other Ambulatory Visit: Payer: Self-pay | Admitting: Oncology

## 2021-11-20 ENCOUNTER — Other Ambulatory Visit: Payer: Self-pay | Admitting: *Deleted

## 2021-11-20 DIAGNOSIS — D5919 Other autoimmune hemolytic anemia: Secondary | ICD-10-CM

## 2021-11-20 LAB — CBC WITH DIFFERENTIAL/PLATELET
Abs Immature Granulocytes: 0.99 10*3/uL — ABNORMAL HIGH (ref 0.00–0.07)
Basophils Absolute: 0.1 10*3/uL (ref 0.0–0.1)
Basophils Relative: 0 %
Eosinophils Absolute: 0 10*3/uL (ref 0.0–0.5)
Eosinophils Relative: 0 %
HCT: 22.1 % — ABNORMAL LOW (ref 36.0–46.0)
Hemoglobin: 7.1 g/dL — ABNORMAL LOW (ref 12.0–15.0)
Immature Granulocytes: 6 %
Lymphocytes Relative: 25 %
Lymphs Abs: 4.1 10*3/uL — ABNORMAL HIGH (ref 0.7–4.0)
MCH: 42 pg — ABNORMAL HIGH (ref 26.0–34.0)
MCHC: 32.1 g/dL (ref 30.0–36.0)
MCV: 130.8 fL — ABNORMAL HIGH (ref 80.0–100.0)
Monocytes Absolute: 1.1 10*3/uL — ABNORMAL HIGH (ref 0.1–1.0)
Monocytes Relative: 7 %
Neutro Abs: 9.8 10*3/uL — ABNORMAL HIGH (ref 1.7–7.7)
Neutrophils Relative %: 62 %
Platelets: 237 10*3/uL (ref 150–400)
RBC: 1.69 MIL/uL — ABNORMAL LOW (ref 3.87–5.11)
WBC: 15.9 10*3/uL — ABNORMAL HIGH (ref 4.0–10.5)
nRBC: 15.7 % — ABNORMAL HIGH (ref 0.0–0.2)

## 2021-11-20 LAB — RETICULOCYTES
Immature Retic Fract: 50.2 % — ABNORMAL HIGH (ref 2.3–15.9)
RBC.: 1.74 MIL/uL — ABNORMAL LOW (ref 3.87–5.11)
Retic Count, Absolute: 347 10*3/uL — ABNORMAL HIGH (ref 19.0–186.0)
Retic Ct Pct: 19.9 % — ABNORMAL HIGH (ref 0.4–3.1)

## 2021-11-20 LAB — BPAM RBC
Blood Product Expiration Date: 202301022359
Blood Product Expiration Date: 202301022359
Blood Product Expiration Date: 202301102359
Blood Product Expiration Date: 202301252359
Blood Product Expiration Date: 202301252359
ISSUE DATE / TIME: 202212300326
ISSUE DATE / TIME: 202212301326
ISSUE DATE / TIME: 202301021050
Unit Type and Rh: 5100
Unit Type and Rh: 5100
Unit Type and Rh: 5100
Unit Type and Rh: 5100
Unit Type and Rh: 5100

## 2021-11-20 LAB — COMPREHENSIVE METABOLIC PANEL
ALT: 76 U/L — ABNORMAL HIGH (ref 0–44)
AST: 46 U/L — ABNORMAL HIGH (ref 15–41)
Albumin: 3.6 g/dL (ref 3.5–5.0)
Alkaline Phosphatase: 66 U/L (ref 38–126)
Anion gap: 10 (ref 5–15)
BUN: 30 mg/dL — ABNORMAL HIGH (ref 8–23)
CO2: 23 mmol/L (ref 22–32)
Calcium: 8.9 mg/dL (ref 8.9–10.3)
Chloride: 104 mmol/L (ref 98–111)
Creatinine, Ser: 0.94 mg/dL (ref 0.44–1.00)
GFR, Estimated: >60 mL/min (ref >60)
Glucose, Bld: 83 mg/dL (ref 70–99)
Potassium: 4.1 mmol/L (ref 3.5–5.1)
Sodium: 137 mmol/L (ref 135–145)
Total Bilirubin: 2.4 mg/dL — ABNORMAL HIGH (ref 0.3–1.2)
Total Protein: 6.2 g/dL — ABNORMAL LOW (ref 6.5–8.1)

## 2021-11-20 LAB — TYPE AND SCREEN
ABO/RH(D): O POS
Antibody Screen: POSITIVE
Donor AG Type: NEGATIVE
Donor AG Type: NEGATIVE
Unit division: 0
Unit division: 0
Unit division: 0
Unit division: 0
Unit division: 0

## 2021-11-20 LAB — LACTATE DEHYDROGENASE: LDH: 715 U/L — ABNORMAL HIGH (ref 98–192)

## 2021-11-20 MED ORDER — PANTOPRAZOLE SODIUM 40 MG PO TBEC: 40.0000 mg | DELAYED_RELEASE_TABLET | Freq: Every day | ORAL | Status: DC

## 2021-11-20 MED ORDER — FOLIC ACID 1 MG PO TABS: 1.0000 mg | ORAL_TABLET | Freq: Every day | ORAL | Status: DC

## 2021-11-20 MED ORDER — APIXABAN 5 MG PO TABS: 5.0000 mg | ORAL_TABLET | Freq: Two times a day (BID) | ORAL | 1 refills | Status: DC

## 2021-11-20 MED ORDER — PREDNISONE 20 MG PO TABS: 80.0000 mg | ORAL_TABLET | Freq: Every day | ORAL | 0 refills | Status: DC

## 2021-11-20 MED ORDER — PANTOPRAZOLE SODIUM 40 MG PO TBEC: 40.0000 mg | DELAYED_RELEASE_TABLET | Freq: Every day | ORAL | 1 refills | Status: DC

## 2021-11-20 NOTE — Progress Notes (Signed)
Pt discharged home with spouse in stable condition. Discharge instructions given. Pt and spouse verbalized understanding. No immediate questions or concerns. Discharged from unit via wheelchair.

## 2021-11-20 NOTE — Care Management Important Message (Signed)
Important Message  Patient Details IM Letter given to the Patient. Name: Andrea Williamson MRN: [id number] Date of Birth: 1951-06-26   Medicare Important Message Given:  Yes     Kerin Salen 11/20/2021, 10:21 AM

## 2021-11-20 NOTE — Discharge Summary (Signed)
Physician Discharge Summary   Patient: Andrea Williamson MRN: [id number] DOB: [birth year]  Admit date:     11/13/2021  Discharge date: 11/20/21  Discharge Physician: Murray Hodgkins   PCP: Pcp, No   Recommendations at discharge:   Hemolytic anemia, see discussion below Adnexal mass, will be followed up by Dr. Berline Lopes, see below New diagnosis DVT started on apixaban Status post stroke 2022 on dual antiplatelet therapy.  Now just on Brilinta, see discussion below, can transition to aspirin 3 months.  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  Was seen by hematology, started on steroids and eventually given rituximab.  Hemoglobin stabilized and hematology cleared the patient for discharge with recommendations as below.  Patient was previously on dual antiplatelet therapy for stroke last year after intervention and stent placement.  I spent considerable time today coordinating care with interventional radiology, stroke neurology, and hematology, including Drs. de Sindy Messing, Dr. Antony Contras and Dr. Julieanne Manson to discussed recommendations for discharge.  End result was neurology and interventional radiology recommending Brilinta with apixaban for 3 months, followed by aspirin with apixaban (neurology recommendation), or continued Brilinta (interventional radiology recommendation).  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  Now on oral prednisone.   Recommendations: Continue prednisone 80 mg daily  - to be tapered as outpatient.  Continue folic acid 1 mg daily. Continue Eliquis 5 mg twice daily Follow-up with Dr. Janese Banks on 11/23/2021.  Message has been sent to the [facility] cancer center to arrange appointments and contact the patient with the date/time.  Adnexal mass- (present on admission) --Seen by Dr. Berline Lopes in GYN oncology, at this point risk of surgical intervention was felt to be too high, we will follow-up with her in 3 months for repeat imaging.  DVT (deep venous thrombosis) ([place])- (present on admission) --Continue apixaban.  Follow-up as an outpatient.    Essential hypertension- (present on admission) --Continue Norvasc.    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  Patient was on aspirin and Brilinta .  --now on apixaban (DVT) and Brilinta as above.      Consultants: hematology  Procedures performed: none  Disposition: Home Diet recommendation: Regular diet  DISCHARGE MEDICATION: Allergies as of 11/20/2021   No Known Allergies      Medication List     STOP taking these medications    Aspirin Low Dose 81 MG chewable tablet Generic drug: aspirin   enoxaparin 80 MG/0.8ML injection Commonly known as: LOVENOX   methylPREDNISolone sodium succinate 125 mg/2 mL injection Commonly known as: SOLU-MEDROL       TAKE these medications    acetaminophen 325 MG tablet Commonly known as: TYLENOL Take 1-2 tablets (325-650 mg total) by mouth every 4 (four) hours as needed for mild pain.   amLODipine 5 MG tablet Commonly known as: NORVASC Take 1 tablet (5 mg total) by mouth daily.   apixaban 5 MG Tabs tablet Commonly known as: ELIQUIS Take 1 tablet (5 mg total) by mouth  2 (two) times daily.   atorvastatin 80 MG tablet Commonly known as: LIPITOR Take 1 tablet (80 mg total) by mouth daily.   Brilinta 90 MG Tabs tablet Generic drug: ticagrelor Take 1 tablet (90 mg total) by  mouth 2 (two) times daily.   folic acid 1 MG tablet Commonly known as: FOLVITE Take 1 tablet (1 mg total) by mouth daily. Start taking on: November 21, 2021   hydrALAZINE 25 MG tablet Commonly known as: APRESOLINE Take 1 tablet (25 mg total) by mouth every 8 (eight) hours as needed (SBP >170 or DBP >110).   magnesium gluconate 500 MG tablet Commonly known as: MAGONATE Take 0.5 tablets (250 mg total) by mouth at bedtime.   ondansetron 4 MG disintegrating tablet Commonly known as: ZOFRAN-ODT Take 1 tablet (4 mg total) by mouth once as needed for nausea.   pantoprazole 40 MG tablet Commonly known as: Protonix Take 1 tablet (40 mg total) by mouth daily.   predniSONE 20 MG tablet Commonly known as: DELTASONE Take 4 tablets (80 mg total) by mouth daily with breakfast. Start taking on: November 21, 2021   Senexon-S 8.6-50 MG tablet Generic drug: senna-docusate Take 1 tablet by mouth daily at 6 (six) AM. What changed:  when to take this reasons to take this        Follow-up Information     [name], MD Follow up.   Specialty: Oncology Why: Office will contact you for appointment Contact information: [address] [place] [address] [phone]                Patient feels well today, no complaints  Discharge Exam: Filed Weights   11/15/21 1445  Weight: 70.1 kg   Physical Exam Constitutional:      General: She is not in acute distress.    Appearance: She is not ill-appearing or toxic-appearing.  Cardiovascular:     Rate and Rhythm: Normal rate and regular rhythm.     Heart sounds: No murmur heard. Pulmonary:     Effort: Pulmonary effort is normal. No respiratory distress.     Breath sounds: No wheezing, rhonchi or rales.  Neurological:     Mental Status: She is alert.  Psychiatric:        Mood and Affect: Mood normal.        Behavior: Behavior normal.     Condition at discharge: good  The results of significant diagnostics from  this hospitalization (including imaging, microbiology, ancillary and laboratory) are listed below for reference.   Imaging Studies: CT Head Wo Contrast  Result Date: 11/12/2021 CLINICAL DATA:  Dizziness nausea vomiting EXAM: CT HEAD WITHOUT CONTRAST TECHNIQUE: Contiguous axial images were obtained from the base of the skull through the vertex without intravenous contrast. COMPARISON:  CT brain 10/01/2021 FINDINGS: Brain: No acute territorial infarction, hemorrhage, or intracranial mass. Scattered encephalomalacia within the left frontal lobe and white matter corresponding to infarcts on previous MRI. Mild atrophy. Chronic small vessel ischemic changes of the white matter. Vague hypodensity within the left cerebellum corresponding to small infarct on prior MRI. Vascular: Left A2 stent as before.  Carotid vascular calcifications. Skull: Normal. Negative for fracture or focal lesion. Sinuses/Orbits: No acute finding. Other: None IMPRESSION: 1. No CT evidence for acute intracranial abnormality. Atrophy and chronic small vessel ischemic changes of the white matter 2. Multiple small chronic appearing left ACA infarcts as noted on previous imaging. Electronically Signed   By: Donavan Foil M.D.   On: 11/12/2021  22:40   MR PELVIS W WO CONTRAST  Result Date: 11/18/2021 CLINICAL DATA:  Right adnexal mass on prior ultrasound. EXAM: MRI PELVIS WITHOUT AND WITH CONTRAST TECHNIQUE: Multiplanar multisequence MR imaging of the pelvis was performed both before and after administration of intravenous contrast. CONTRAST:  31mL GADAVIST GADOBUTROL 1 MMOL/ML IV SOLN COMPARISON:  Ultrasound and CTs of 11/13/2021. FINDINGS: Urinary Tract: Normal urinary bladder. No distal hydroureter. Normal imaged kidneys. Bowel: Normal pelvic bowel loops. Vascular/Lymphatic: Aortic atherosclerosis. No pelvic aneurysm or sidewall adenopathy. Reproductive: Uterus: Measures 5.6 x 4.3 by 6.7 cm (volume = 84 cm^3). Demonstrates a T2 hypointense  left-sided intramural lesion which is most consistent with a fibroid. This avidly enhances. No endometrial thickening. Right ovary: Corresponding to the ultrasound abnormality, a complex cystic lesion within the right ovary demonstrates T2 hyperintensity and T1 hyperintensity at 2.9 x 3.0 cm on 18/3. Anteriorly, there is a 1.0 cm enhancing soft tissue component on 42/26 which corresponds to the ultrasound area of vascularity. The remaining more cephalad and anterior soft tissue signal including at 1.7 x 2.6 cm on 38/26 is favored to represent normal ovarian parenchyma. Left ovary:  Normal, including on 12/03. Other: No significant free fluid. Musculoskeletal: Lumbosacral spondylosis, including discogenic edema at L4-5. A T2 hyperintense right iliac lesion of 2.0 cm on 08/03 is consistent with a hemangioma. IMPRESSION: 1. Complex solid and cystic 2.9 x 3.0 cm right ovarian mass which (especially given the ultrasound appearance) is highly suspicious for cystic neoplasm. Consider gynecological oncology consultation. 2. Left-sided uterine fibroid. Electronically Signed   By: Abigail Miyamoto M.D.   On: 11/18/2021 20:28   CT CHEST ABDOMEN PELVIS W CONTRAST  Result Date: 11/13/2021 CLINICAL DATA:  Occult malignancy.  Nausea. EXAM: CT CHEST, ABDOMEN, AND PELVIS WITH CONTRAST TECHNIQUE: Multidetector CT imaging of the chest, abdomen and pelvis was performed following the standard protocol during bolus administration of intravenous contrast. CONTRAST:  152mL OMNIPAQUE IOHEXOL 300 MG/ML  SOLN COMPARISON:  None. FINDINGS: CT CHEST FINDINGS Cardiovascular: Heart size is normal. Coronary artery calcifications are present. The appearance of the pulmonary arteries is unremarkable accounting for the contrast bolus timing. There is atherosclerotic calcification of the thoracic aorta, not associated with aneurysm. Mediastinum/Nodes: The visualized portion of the thyroid gland has a normal appearance. There is mild thickening of the  LOWER esophageal wall, not associated with discrete mass. No significant mediastinal, hilar, or axillary adenopathy. Lungs/Pleura: Minimal LEFT apical pleuroparenchymal changes. No pulmonary nodules. No consolidations or pleural effusions. Musculoskeletal: Mild midthoracic degenerative changes. CT ABDOMEN PELVIS FINDINGS Hepatobiliary: Mildly prominent RIGHT hepatic lobe, variation of normal. No focal hepatic lesions. Normal parenchymal density. Gallbladder is present. Pancreas: Unremarkable. No pancreatic ductal dilatation or surrounding inflammatory changes. Spleen: Normal in size without focal abnormality. Adrenals/Urinary Tract: Adrenal glands are normal in appearance. A 1 centimeter benign cyst is identified in the UPPER pole of the RIGHT kidney. LEFT kidney is unremarkable. Ureters are unremarkable. The bladder and visualized portion of the urethra are normal. Stomach/Bowel: Mild thickening of the LOWER esophageal wall. Stomach is otherwise unremarkable. Small bowel loops are normal in appearance. Moderate stool burden identified within the rectosigmoid colon. Large bowel is otherwise unremarkable. The appendix is well seen and normal in appearance. Vascular/Lymphatic: There is mild, nonspecific stranding within the mesentery in the RIGHT UPPER QUADRANT. Small lymph nodes are identified within this region, largest measuring 7 millimeters in short axis, image 76 of series 2. No retroperitoneal adenopathy. There is atherosclerotic calcification of the abdominal aorta, not associated with  aneurysm. There is normal vascular opacification of the celiac axis, superior mesenteric artery, and inferior mesenteric artery. Normal appearance of the portal venous system and inferior vena cava. Reproductive: The uterus is present. A central lobulated enhancing mass likely represents fibroid, measuring 3.0 centimeters on image 105 of series 2. The RIGHT adnexal region is notable for a solid and cystic mass measuring 4.9 x  3.4 centimeters on image 102 of series 2. LEFT adnexal region is unremarkable. No ascites. Other: Anterior abdominal wall is notable for fat containing paraumbilical hernia. Musculoskeletal: No acute or significant osseous findings. IMPRESSION: 1. No acute abnormality of the chest. 2. Solid and cystic mass within the RIGHT adnexa, likely representing ovarian lesion measuring 4.9 centimeters. Recommend further characterization with pelvic ultrasound. 3. Nonspecific mesenteric stranding in the RIGHT UPPER QUADRANT, associated subcentimeter lymph nodes. Consider follow-up in 3-6 months to assess for stability. 4.  Aortic atherosclerosis.  (ICD10-I70.0) 5. RIGHT renal cyst. 6. Uterine fibroid. 7. Mild thickening of the LOWER esophagus, raising question of esophagitis. Electronically Signed   By: Nolon Nations M.D.   On: 11/13/2021 08:23   US Venous Img Lower Bilateral (DVT)  Result Date: 11/13/2021 CLINICAL DATA:  Hemolytic anemia EXAM: BILATERAL LOWER EXTREMITY VENOUS DOPPLER ULTRASOUND TECHNIQUE: Gray-scale sonography with compression, as well as color and duplex ultrasound, were performed to evaluate the deep venous system(s) from the level of the common femoral vein through the popliteal and proximal calf veins. COMPARISON:  None. FINDINGS: VENOUS Normal compressibility, phasicity, and augmentation of the common femoral, superficial femoral, and popliteal veins bilaterally. However, there is occlusive thrombus in the right posterior tibial vein, and occlusive thrombus in the left peroneal vein. OTHER None. Limitations: none IMPRESSION: 1. Today's exam is positive for bilateral below knee deep vein thrombosis (BKDVT), with occlusive thrombus in the right posterior tibial vein and in the left peroneal vein. Electronically Signed   By: Van Clines M.D.   On: 11/13/2021 09:19   US PELVIC COMPLETE WITH TRANSVAGINAL  Result Date: 11/13/2021 CLINICAL DATA:  Right adnexal mass seen in the CT done earlier  today EXAM: TRANSABDOMINAL AND TRANSVAGINAL ULTRASOUND OF PELVIS DOPPLER ULTRASOUND OF OVARIES TECHNIQUE: Both transabdominal and transvaginal ultrasound examinations of the pelvis were performed. Transabdominal technique was performed for global imaging of the pelvis including uterus, ovaries, adnexal regions, and pelvic cul-de-sac. It was necessary to proceed with endovaginal exam following the transabdominal exam to visualize the ovaries. Color and duplex Doppler ultrasound was utilized to evaluate blood flow to the ovaries. COMPARISON:  CT done earlier today FINDINGS: Uterus Measurements: 5.9 x 2.4 x 5 cm = volume: 37.2 mL. There is 4 x 2.7 x 2.7 cm fibroid in the left side of body. Endometrium Thickness: 2.9 mm.  No focal abnormality visualized. Right ovary Measurements: 3.9 x 2.9 x 3.4 cm = volume: 20.15 mL. Is a complex right adnexal lesion. Lesion is mostly cystic with a lobulated solid component in the margin. There is demonstrable vascular flow in the solid component. Cystic portion of the lesion measures 3.9 x 2.6 x 3.3 cm. Solid component in the margin measures 1.4 x 0.8 x 1.2 cm. Left ovary Not sonographically visualized. Pulsed Doppler evaluation of right ovary demonstrates normal low-resistance arterial and venous waveforms. Other findings No abnormal free fluid. IMPRESSION: There is a complex lesion in the right adnexa with mostly cystic echogenicity along with a lobulated solid nodule measuring 1.4 cm in size with demonstrable internal vascularity. Possibility of malignant neoplastic process in the right ovary is  not excluded. 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 Left ovary is not sonographically visualized. Electronically Signed   By: Elmer Picker M.D.   On: 11/13/2021 12:26    Microbiology: Results for orders placed or performed during the hospital encounter of 11/12/21  Resp Panel by RT-PCR (Flu A&B, Covid) Nasopharyngeal Swab     Status: None    Collection Time: 11/12/21  9:42 PM   Specimen: Nasopharyngeal Swab; Nasopharyngeal(NP) swabs in vial transport medium  Result Value Ref Range Status   SARS Coronavirus 2 by RT PCR NEGATIVE NEGATIVE Final    Comment: (NOTE) SARS-CoV-2 target nucleic acids are NOT DETECTED.  The SARS-CoV-2 RNA is generally detectable in upper respiratory specimens during the acute phase of infection. The lowest concentration of SARS-CoV-2 viral copies this assay can detect is 138 copies/mL. A negative result does not preclude SARS-Cov-2 infection and should not be used as the sole basis for treatment or other patient management decisions. A negative result may occur with  improper specimen collection/handling, submission of specimen other than nasopharyngeal swab, presence of viral mutation(s) within the areas targeted by this assay, and inadequate number of viral copies(<138 copies/mL). A negative result must be combined with clinical observations, patient history, and epidemiological information. The expected result is Negative.  Fact Sheet for Patients:  EntrepreneurPulse.com.au  Fact Sheet for Healthcare Providers:  IncredibleEmployment.be  This test is no t yet approved or cleared by the [place] FDA and  has been authorized for detection and/or diagnosis of SARS-CoV-2 by FDA under an Emergency Use Authorization (EUA). This EUA will remain  in effect (meaning this test can be used) for the duration of the COVID-19 declaration under Section 564(b)(1) of the Act, 21 U.S.C.section 360bbb-3(b)(1), unless the authorization is terminated  or revoked sooner.       Influenza A by PCR NEGATIVE NEGATIVE Final   Influenza B by PCR NEGATIVE NEGATIVE Final    Comment: (NOTE) The Xpert Xpress SARS-CoV-2/FLU/RSV plus assay is intended as an aid in the diagnosis of influenza from Nasopharyngeal swab specimens and should not be used as a sole basis for treatment.  Nasal washings and aspirates are unacceptable for Xpert Xpress SARS-CoV-2/FLU/RSV testing.  Fact Sheet for Patients: EntrepreneurPulse.com.au  Fact Sheet for Healthcare Providers: IncredibleEmployment.be  This test is not yet approved or cleared by the [place] FDA and has been authorized for detection and/or diagnosis of SARS-CoV-2 by FDA under an Emergency Use Authorization (EUA). This EUA will remain in effect (meaning this test can be used) for the duration of the COVID-19 declaration under Section 564(b)(1) of the Act, 21 U.S.C. section 360bbb-3(b)(1), unless the authorization is terminated or revoked.  Performed at [facility], [address] [place], [place] [address]     Labs: CBC: Recent Labs  Lab 11/16/21 [phone] 11/17/21 0454 11/18/21 0529 11/19/21 0711 11/20/21 0540  WBC 27.6* 20.6* 19.3* 16.4* 15.9*  NEUTROABS  --   --   --   --  9.8*  HGB 7.3* 7.3* 7.0* 6.8* 7.1*  HCT 22.1* 22.6* 22.9* 21.5* 22.1*  MCV 111.6* 117.7* 126.5* 125.0* 130.8*  PLT 263 260 247 226 409   Basic Metabolic Panel: Recent Labs  Lab 11/14/21 0154 11/15/21 0552 11/16/21 0618 11/17/21 0454 11/18/21 0529 11/19/21 0711 11/20/21 0540  NA 135   < > 138 141 137 136 137  K 3.0*   < > 3.5 3.7 3.8 4.0 4.1  CL 101   < > 108 106 107  105 104  CO2 24   < > 23 25 24 25 23   GLUCOSE 104*   < > 90 106* 87 78 83  BUN 32*   < > 46* 34* 28* 26* 30*  CREATININE 1.10*   < > 1.02* 0.97 0.82 0.89 0.94  CALCIUM 9.3   < > 8.9 8.8* 8.6* 8.5* 8.9  MG 2.3  --   --   --   --   --   --    < > = values in this interval not displayed.   Liver Function Tests: Recent Labs  Lab 11/16/21 0618 11/17/21 0454 11/18/21 0529 11/19/21 0711 11/20/21 0540  AST 26 27 31  48* 46*  ALT 18 22 34 68* 76*  ALKPHOS 82 80 69 70 66  BILITOT 2.2* 2.8* 2.5* 2.5* 2.4*  PROT 6.4* 6.5 6.2* 5.8* 6.2*  ALBUMIN 3.6 3.6 3.6 3.6 3.6   CBG: No results for input(s): GLUCAP in  the last 168 hours.  Discharge time spent: greater than 30 minutes.  Signed: Murray Hodgkins, MD [facility] 11/20/2021

## 2021-11-20 NOTE — Progress Notes (Addendum)
IP PROGRESS NOTE  Subjective:   No new complaints this morning.  Denies bleeding.  Denies dizziness.  No family at the bedside.  Objective: Vital signs in last 24 hours: Blood pressure (!) 154/83, pulse 69, temperature 98 F (36.7 C), temperature source Oral, resp. rate 18, height 5\' 3"  (1.6 m), weight 70.1 kg, SpO2 98 %.  Intake/Output from previous day: 01/05 0701 - 01/06 0700 In: 600 [P.O.:600] Out: -   Physical Exam:  HEENT: Mild scleral icterus, no thrush Lungs: Clear bilaterally, no respiratory distress Cardiac: Regular rate and rhythm, occasional premature beat Abdomen: No hepatosplenomegaly, no mass, nontender Extremities: No leg edema   Lab Results: Recent Labs    11/19/21 0711 11/20/21 0540  WBC 16.4* 15.9*  HGB 6.8* 7.1*  HCT 21.5* 22.1*  PLT 226 237   Review of peripheral blood smear from 11/15/2021: The platelets appear normal in number, the majority of the white cells are mature neutrophils, few myelocytes and metamyelocytes, no blasts.  The polychromasia is markedly increased.  Numerous nucleated red cells, few earlier Red cell precursors.  Spherocytes. BMET Recent Labs    11/19/21 0711 11/20/21 0540  NA 136 137  K 4.0 4.1  CL 105 104  CO2 25 23  GLUCOSE 78 83  BUN 26* 30*  CREATININE 0.89 0.94  CALCIUM 8.5* 8.9   Bilirubin 2.5, LDH 732  Medications: I have reviewed the patient's current medications.  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  Her hemoglobin today is stable to slightly improved.  T bili remains stable.  Absolute reticulocyte count is  slowly decreasing and LDH is stable.  No PRBC transfusion is indicated at this time.  From our standpoint, the patient is stable for discharge.  We recommend outpatient follow-up with Dr. Janese Banks at the [facility] cancer center which is closer to her home.  I have sent a message to Dr. Janese Banks directly and also to her nursing staff to arrange for a follow-up on 11/23/2021 for repeat lab work, visit, and rituximab.  The patient should continue to take prednisone 80 mg daily which will be tapered as an outpatient and folic acid 1 mg daily.  She will also continue Eliquis 5 mg twice daily for the bilateral lower extremity DVTs.  She saw GYN oncology.  Outpatient follow-up is being scheduled.  The CA125 is normal.    Recommendations: Continue prednisone 80 mg daily - to be tapered as outpatient.  Continue folic acid 1 mg daily. Continue Eliquis 5 mg twice daily Okay for discharge from our standpoint with outpatient follow-up with Dr. Janese Banks on 11/23/2021.  Message has been sent to the [facility] cancer center to arrange appointments and contact the patient with the date/time.   LOS: 7 days   Mikey Bussing, NP   11/20/2021, 7:56 AM  [name] was interviewed and examined.  She appears stable.  She is ambulating.  The hemoglobin is stable.  She continues to have laboratory evidence of significant hemolysis.  Hopefully this will improve over the next week with continuation of prednisone and another treatment with rituximab.  She will be due for treatment with rituximab on 11/23/2021.  She will need close outpatient follow-up with Dr. Janese Banks.  I was present for  greater than 50% of today's visit.  I performed medical decision making.

## 2021-11-20 NOTE — Plan of Care (Signed)
Problem: Education: Goal: Knowledge of General Education information will improve Description: Including pain rating scale, medication(s)/side effects and non-pharmacologic comfort measures Outcome: Completed/Met   Problem: Health Behavior/Discharge Planning: Goal: Ability to manage health-related needs will improve Outcome: Completed/Met   Problem: Clinical Measurements: Goal: Ability to maintain clinical measurements within normal limits will improve Outcome: Completed/Met Goal: Will remain free from infection Outcome: Completed/Met Goal: Diagnostic test results will improve Outcome: Completed/Met Goal: Respiratory complications will improve Outcome: Completed/Met Goal: Cardiovascular complication will be avoided Outcome: Completed/Met   Problem: Activity: Goal: Risk for activity intolerance will decrease Outcome: Completed/Met   Problem: Nutrition: Goal: Adequate nutrition will be maintained Outcome: Completed/Met   Problem: Coping: Goal: Level of anxiety will decrease Outcome: Completed/Met   Problem: Elimination: Goal: Will not experience complications related to bowel motility Outcome: Completed/Met Goal: Will not experience complications related to urinary retention Outcome: Completed/Met   Problem: Pain Managment: Goal: General experience of comfort will improve Outcome: Completed/Met   Problem: Safety: Goal: Ability to remain free from injury will improve Outcome: Completed/Met   Problem: Skin Integrity: Goal: Risk for impaired skin integrity will decrease Outcome: Completed/Met   Problem: Acute Rehab PT Goals(only PT should resolve) Goal: Pt Will Ambulate Outcome: Completed/Met Goal: Pt Will Go Up/Down Stairs Outcome: Completed/Met Goal: Pt/caregiver will Perform Home Exercise Program Outcome: Completed/Met   

## 2021-11-23 ENCOUNTER — Inpatient Hospital Stay: Payer: Medicare Other | Admitting: Oncology

## 2021-11-23 ENCOUNTER — Encounter: Payer: Self-pay | Admitting: Internal Medicine

## 2021-11-23 ENCOUNTER — Ambulatory Visit (INDEPENDENT_AMBULATORY_CARE_PROVIDER_SITE_OTHER): Payer: Medicare Other | Admitting: Internal Medicine

## 2021-11-23 ENCOUNTER — Other Ambulatory Visit: Payer: Self-pay

## 2021-11-23 ENCOUNTER — Inpatient Hospital Stay: Payer: Medicare Other

## 2021-11-23 VITALS — BP 141/86 | HR 82 | Ht 62.0 in | Wt 155.1 lb

## 2021-11-23 DIAGNOSIS — I6310 Cerebral infarction due to embolism of unspecified precerebral artery: Secondary | ICD-10-CM

## 2021-11-23 DIAGNOSIS — D5919 Other autoimmune hemolytic anemia: Secondary | ICD-10-CM

## 2021-11-23 DIAGNOSIS — D5911 Warm autoimmune hemolytic anemia: Secondary | ICD-10-CM | POA: Diagnosis not present

## 2021-11-23 DIAGNOSIS — I10 Essential (primary) hypertension: Secondary | ICD-10-CM

## 2021-11-23 DIAGNOSIS — I82403 Acute embolism and thrombosis of unspecified deep veins of lower extremity, bilateral: Secondary | ICD-10-CM

## 2021-11-23 DIAGNOSIS — I63322 Cerebral infarction due to thrombosis of left anterior cerebral artery: Secondary | ICD-10-CM

## 2021-11-23 DIAGNOSIS — E7800 Pure hypercholesterolemia, unspecified: Secondary | ICD-10-CM | POA: Diagnosis not present

## 2021-11-23 DIAGNOSIS — Z8673 Personal history of transient ischemic attack (TIA), and cerebral infarction without residual deficits: Secondary | ICD-10-CM

## 2021-11-23 DIAGNOSIS — N9489 Other specified conditions associated with female genital organs and menstrual cycle: Secondary | ICD-10-CM

## 2021-11-23 MED ORDER — ATORVASTATIN CALCIUM 80 MG PO TABS: 80.0000 mg | ORAL_TABLET | Freq: Every day | ORAL | 1 refills | Status: DC

## 2021-11-23 MED ORDER — AMLODIPINE BESYLATE 5 MG PO TABS: 5.0000 mg | ORAL_TABLET | Freq: Every day | ORAL | 1 refills | Status: AC

## 2021-11-23 MED ORDER — TRAZODONE HCL 50 MG PO TABS
25.0000 mg | ORAL_TABLET | Freq: Every evening | ORAL | 1 refills | Status: DC | PRN
Start: 2021-11-23 — End: 2021-11-25

## 2021-11-23 NOTE — Assessment & Plan Note (Signed)

## 2021-11-23 NOTE — Assessment & Plan Note (Signed)
Suggest follow-up with oncology 

## 2021-11-23 NOTE — Assessment & Plan Note (Signed)
Resolved

## 2021-11-23 NOTE — Assessment & Plan Note (Signed)
Refer to hematologist °

## 2021-11-23 NOTE — Assessment & Plan Note (Signed)
Patient is alert cooperative, she has  unsteady gait

## 2021-11-23 NOTE — Progress Notes (Signed)
New Patient Office Visit  Subjective:  Patient ID: Andrea Williamson, female    DOB: 26-Aug-1951  Age: 71 y.o. MRN: [id number]  CC:  Chief Complaint  Patient presents with   New Patient (Initial Visit)    HPI Patient presents for establish as new pt  Past Medical History:  Diagnosis Date   Hypertension    Stroke [facility])      Current Outpatient Medications:    acetaminophen (TYLENOL) 325 MG tablet, Take 1-2 tablets (325-650 mg total) by mouth every 4 (four) hours as needed for mild pain., Disp: , Rfl:    apixaban (ELIQUIS) 5 MG TABS tablet, Take 1 tablet (5 mg total) by mouth 2 (two) times daily., Disp: 60 tablet, Rfl: 1   folic acid (FOLVITE) 1 MG tablet, Take 1 tablet (1 mg total) by mouth daily., Disp: , Rfl:    hydrALAZINE (APRESOLINE) 25 MG tablet, Take 1 tablet (25 mg total) by mouth every 8 (eight) hours as needed (SBP >170 or DBP >110)., Disp: , Rfl:    magnesium gluconate (MAGONATE) 500 MG tablet, Take 0.5 tablets (250 mg total) by mouth at bedtime., Disp: 30 tablet, Rfl: 0   ondansetron (ZOFRAN-ODT) 4 MG disintegrating tablet, Take 1 tablet (4 mg total) by mouth once as needed for nausea., Disp: 20 tablet, Rfl: 0   pantoprazole (PROTONIX) 40 MG tablet, Take 1 tablet (40 mg total) by mouth daily., Disp: 30 tablet, Rfl: 1   predniSONE (DELTASONE) 20 MG tablet, Take 4 tablets (80 mg total) by mouth daily with breakfast., Disp: 120 tablet, Rfl: 0   traZODone (DESYREL) 50 MG tablet, Take 0.5-1 tablets (25-50 mg total) by mouth at bedtime as needed for sleep., Disp: 90 tablet, Rfl: 1   amLODipine (NORVASC) 5 MG tablet, Take 1 tablet (5 mg total) by mouth daily., Disp: 90 tablet, Rfl: 1   atorvastatin (LIPITOR) 80 MG tablet, Take 1 tablet (80 mg total) by mouth daily., Disp: 90 tablet, Rfl: 1   Past Surgical History:  Procedure Laterality Date   ANKLE SURGERY Left 2008   IR CT HEAD LTD  10/05/2021   IR INTRA CRAN STENT  10/01/2021   IR PERCUTANEOUS ART THROMBECTOMY/INFUSION  INTRACRANIAL INC DIAG ANGIO  10/01/2021   IR US GUIDE VASC ACCESS RIGHT  10/01/2021   RADIOLOGY WITH ANESTHESIA N/A 10/01/2021   Procedure: IR WITH ANESTHESIA;  Surgeon: Radiologist, Medication, MD;  Location: [place];  Service: Radiology;  Laterality: N/A;    Family History  Problem Relation Age of Onset   COPD Mother    Cancer Mother     Social History   Socioeconomic History   Marital status: Married    Spouse name: Not on file   Number of children: 3   Years of education: Not on file   Highest education level: 12th grade  Occupational History   Occupation: Retired  Tobacco Use   Smoking status: Former    Packs/day: 1.50    Years: 50.00    Pack years: 75.00    Types: Cigarettes    Quit date: 09/2021    Years since quitting: 0.1   Smokeless tobacco: Never  Vaping Use   Vaping Use: Never used  Substance and Sexual Activity   Alcohol use: Not Currently   Drug use: Not Currently   Sexual activity: Not Currently  Other Topics Concern   Not on file  Social History Narrative   Not on file   Social Determinants of Radio broadcast assistant  Strain: Not on file  Food Insecurity: Not on file  Transportation Needs: Not on file  Physical Activity: Not on file  Stress: Not on file  Social Connections: Not on file  Intimate Partner Violence: Not on file    ROS Review of Systems  Constitutional: Negative.   HENT: Negative.    Respiratory: Negative.    Cardiovascular: Negative.   Gastrointestinal: Negative.   Genitourinary: Negative.    Objective:   Today's Vitals: BP (!) 141/86    Pulse 82    Ht 5\' 2"  (1.575 m)    Wt 155 lb 1.6 oz (70.4 kg)    BMI 28.37 kg/m   Physical Exam Constitutional:      Appearance: Normal appearance. She is not ill-appearing.  HENT:     Mouth/Throat:     Pharynx: No oropharyngeal exudate or posterior oropharyngeal erythema.  Cardiovascular:     Rate and Rhythm: Normal rate.     Heart sounds: Normal heart sounds.  Musculoskeletal:         General: Normal range of motion.  Skin:    General: Skin is warm.  Neurological:     General: No focal deficit present.     Mental Status: She is alert. Mental status is at baseline.    Assessment & Plan:   Problem List Items Addressed This Visit       Cardiovascular and Mediastinum   Stroke (cerebrum) ([place])    Patient is alert cooperative, she has  unsteady gait      Relevant Medications   amLODipine (NORVASC) 5 MG tablet   atorvastatin (LIPITOR) 80 MG tablet   Essential hypertension - Primary     Patient denies any chest pain or shortness of breath there is no history of palpitation or paroxysmal nocturnal dyspnea   patient was advised to follow low-salt low-cholesterol diet    ideally I want to keep systolic blood pressure below 130 mmHg, patient was asked to check blood pressure one times a week and give me a report on that.  Patient will be follow-up in 3 months  or earlier as needed, patient will call me back for any change in the cardiovascular symptoms Patient was advised to buy a book from local bookstore concerning blood pressure and read several chapters  every day.  This will be supplemented by some of the material we will give him from the office.  Patient should also utilize other resources like YouTube and Internet to learn more about the blood pressure and the diet.      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  apixaban (ELIQUIS) 5 MG TABS tablet Take 1 tablet (5 mg total) by mouth 2 (two) times daily.   folic acid (FOLVITE) 1 MG tablet Take 1 tablet (1 mg total) by mouth daily.   hydrALAZINE (APRESOLINE) 25 MG tablet Take 1 tablet (25 mg total) by mouth every 8 (eight) hours as needed (SBP >170 or DBP >110).   magnesium gluconate (MAGONATE) 500 MG tablet Take 0.5 tablets (250 mg total) by mouth at bedtime.   ondansetron (ZOFRAN-ODT) 4 MG disintegrating tablet Take 1 tablet (4 mg total) by mouth once as needed for nausea.   pantoprazole (PROTONIX) 40 MG tablet Take 1 tablet (40 mg total) by mouth daily.   predniSONE (DELTASONE) 20 MG tablet Take 4 tablets (80 mg total) by mouth daily with breakfast.   traZODone (DESYREL) 50 MG tablet Take 0.5-1 tablets (25-50 mg total) by mouth at bedtime as needed for sleep.   [DISCONTINUED] amLODipine (NORVASC) 5 MG tablet Take 1 tablet (5 mg total) by mouth daily.   [DISCONTINUED] atorvastatin (LIPITOR) 80 MG tablet Take 1 tablet (80 mg total) by mouth daily.   [DISCONTINUED] senna-docusate (SENOKOT-S) 8.6-50 MG tablet Take 1 tablet by mouth daily at 6 (six) AM. (Patient taking differently: Take 1 tablet by mouth at bedtime as needed for mild constipation.)   [DISCONTINUED] ticagrelor (BRILINTA) 90 MG TABS tablet Take 1 tablet (90 mg total) by mouth 2 (two) times daily.   amLODipine (NORVASC) 5 MG tablet Take 1 tablet (5 mg total) by mouth daily.   atorvastatin (LIPITOR) 80 MG tablet Take 1 tablet (80 mg total) by mouth daily.   No facility-administered encounter medications on file as of 11/23/2021.    Follow-up: No follow-ups on file.   Cletis Athens, MD

## 2021-11-23 NOTE — Assessment & Plan Note (Signed)
Advised to continue statin

## 2021-11-24 ENCOUNTER — Ambulatory Visit: Payer: Medicare Other | Attending: Physical Medicine and Rehabilitation

## 2021-11-24 ENCOUNTER — Other Ambulatory Visit: Payer: Self-pay

## 2021-11-24 DIAGNOSIS — M6281 Muscle weakness (generalized): Secondary | ICD-10-CM | POA: Diagnosis not present

## 2021-11-24 DIAGNOSIS — R2681 Unsteadiness on feet: Secondary | ICD-10-CM | POA: Diagnosis not present

## 2021-11-24 DIAGNOSIS — R2689 Other abnormalities of gait and mobility: Secondary | ICD-10-CM | POA: Diagnosis not present

## 2021-11-24 DIAGNOSIS — R278 Other lack of coordination: Secondary | ICD-10-CM | POA: Insufficient documentation

## 2021-11-24 MED ORDER — ALPRAZOLAM 0.25 MG PO TABS: 0.2500 mg | ORAL_TABLET | Freq: Every evening | ORAL | 0 refills | Status: DC | PRN

## 2021-11-24 NOTE — Therapy (Signed)
[place] MAIN [facility] SERVICES [address] [place], [place], [address] Phone: [phone]   Fax:  [phone]  Physical Therapy Treatment  Patient Details  Name: Andrea Williamson MRN: [id number] Date of Birth: 10/18/1951 Referring Provider (PT): Reesa Chew, [place]   Encounter Date: 11/24/2021   PT End of Session - 11/24/21 1039     Visit Number 8    Number of Visits 25    Date for PT Re-Evaluation 01/13/22    Authorization Time Period Eval performed: 10/21/21    Progress Note Due on Visit 10    PT Start Time 0932    PT Stop Time 1021    PT Time Calculation (min) 49 min    Equipment Utilized During Treatment Gait belt    Activity Tolerance Patient tolerated treatment well    Behavior During Therapy [facility] for tasks assessed/performed             Past Medical History:  Diagnosis Date   Hypertension    Stroke [facility])     Past Surgical History:  Procedure Laterality Date   ANKLE SURGERY Left 2008   IR CT HEAD LTD  10/05/2021   IR INTRA CRAN STENT  10/01/2021   IR PERCUTANEOUS ART THROMBECTOMY/INFUSION INTRACRANIAL INC DIAG ANGIO  10/01/2021   IR US GUIDE VASC ACCESS RIGHT  10/01/2021   RADIOLOGY WITH ANESTHESIA N/A 10/01/2021   Procedure: IR WITH ANESTHESIA;  Surgeon: Radiologist, Medication, MD;  Location: [place];  Service: Radiology;  Laterality: N/A;    There were no vitals filed for this visit.   Subjective Assessment - 11/24/21 1032     Subjective Pt presents to PT after absence due to illness where she went to ED. Pt using RW and with family member present. Pt reports she is not in any pain. Pt recently went to ED due to feeling generally unwell and per chart was diagnosed with warm autoimmune hemolytic anemia. Pt has recently had a follow up with Dr Lavera Guise since d/c from hospital. Pt reports her BP is being monitored and she recently started a new BP medication yesterday. The pt has a follow-up with a cardiologist on Monday. The pt  reports she has been dealing with dizziness/lightheadedness but that she feels a lot better since d/c from the hospital. The pt reports she still does not feel fully like herself and is frustrated: "I feel like I'm starting over." She reports she fell earlier today after her foot hit her walker. Pt fell to the floor and was assisted up by her family member. She reports she did not hit her head and was not injured.    Patient is accompained by: Family member    Pertinent History Pt is a 71 y/o female presenting with R LE strength and balance deficits. Pt is s/p L hemisphere CVA, admitted to the ED 10/01/21. Imaging included MRI scans on 10/02/21 yielding "Redemonstrated infarcts in the left ACA and ACA/PCA watershed  territory, with one new area of restricted diffusion in the medial right parietal lobe, likely an additional acute infarct, status post  A2 stenting. A small amount of associated petechial hemorrhage is  noted within the medial left frontal lobe, without significant  hemorrhagic transformation." per Dr. Merilyn Baba, MD. Pt has PMH that includes HLD.    Limitations Walking;Standing    How long can you sit comfortably? "a long time"    How long can you stand comfortably? 30 minutes to an hour    How  long can you walk comfortably? 7 minutes    Diagnostic tests CT (10/01/21) - 1. The right common femoral artery has normal caliber, adequate for   vascular access.   2. 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   3. Intracranial atherosclerotic disease with multifocal areas of   mild stenosis in the left ACA and MCA vascular trees. MRI HEAD WITHOUT CONTRAST (10/12/21) - "Redemonstrated infarcts in the left ACA and ACA/PCA watershed  territory, with one new area of restricted diffusion in the medial right parietal lobe, likely an additional acute infarct, status post  A2 stenting. A small amount of associated petechial hemorrhage is  noted within the medial left frontal lobe, without significant   hemorrhagic transformation."    Patient Stated Goals wants to be able to go back to bowling in january, taking care of her dog and cat    Currently in Pain? No/denies              INTERVENTIONS  Vitals assessed at beginning of session- SPO2% 100, HR 84 bpm Seated BP: initial reading 134/114 mmHg. Retaken two minutes later following adjustment of cuff LUE: 121/73 mmHg. Pt reports no lightheadedness or HA.  PT continues to monitor pt vitals and response to interventions throughout session for symptoms.   In // bars with gait belt donned and CGA provided throughout unless otherwise specified:   Pt standing, firm surface, EO, WBOS - 40 sec. No increased sway/LOB. Pt rates as easy  Standing, firm surface, EC, WBOS- 30 sec. Minimal increase in sway, no LOB.  Pt standing on airex pad: WBOS EO 45 sec. Minimal increase in sway. WBOS EC - 2x30 sec. Increased sway, initial intermittent UE support and up to min assist from PT. However, pt does improve steadiness with time. NBOS, EC - 3x30-45 sec. Pt requires up to min assist due to LOB. Pt utilizes intermittent UE support to assist in regaining balance. Pt periodically opens her eyes to steady herself.  Pt rates intervention as hard.   Bowling (use of cones, ball) for dynamic balance: pt performs multiple rounds over several minutes Standing firm surface from WBOS to NBOS; pt rates as easy. Initially difficulty with hitting cones, but pt improves with reps.  --Pt progressed to performing semi-tandem with each LE. The pt exhibits no significant decrease in postural stability and no LOB. --Pt progressed to performing with NBOS, firm surface, and then on airex pad with WBOS. Pt with slight unsteadiness on airex pad but still able to maintain balance. Pt continues to show improvement with hand-eye coordination as well.   Stepping forward/backward over hurdle 10x. Pt able to clear hurdle with forward step, but knocks it over 3x stepping back with  RLE. Pt also uses intermittent UE support with backward step.   --Pt looks down at her feet then requires rest break due to reported increase in dizziness. When asked to further describe dizziness pt reports difficulty explaining sensation. She reports it improves when sitting.  BP at end of session, seated LUE:  SPO2 100%, HR 90 bpm 124/75 mmHg  Pt reports no HA and only slight dizziness (improved with rest).  Pt educated throughout session about proper posture and technique with exercises. Improved exercise technique, movement at target joints, use of target muscles after min to mod verbal, visual, tactile cues.      PT Education - 11/24/21 1039     Education Details body mechanics, techniqe with interventions, educated pt on monitoring her BP  Person(s) Educated Patient    Methods Explanation;Demonstration;Verbal cues    Comprehension Verbalized understanding;Returned demonstration;Verbal cues required;Need further instruction              PT Short Term Goals - 10/22/21 0759       PT SHORT TERM GOAL #1   Title Pt will be independent with HEP in order to improve strength and balance in order to decrease fall risk and improve function at home and work.    Baseline 10/21/21: HEP deferred to next visit    Time 6    Period Weeks    Status New    Target Date 12/02/21               PT Long Term Goals - 10/27/21 1440       PT LONG TERM GOAL #1   Title Pt will improve FOTO score to >71% to show improved functional mobility with ADLs    Baseline 10/21/21: 58%    Time 12    Period Weeks    Status New    Target Date 01/13/22      PT LONG TERM GOAL #2   Title Pt will improve BERG by at least 3 points in order to demonstrate clinically significant improvement in balance.    Baseline 10/21/21: 44/56    Time 12    Period Weeks    Status New    Target Date 01/13/22      PT LONG TERM GOAL #3   Title Pt will decrease 5TSTS by at least 3 seconds in order to demonstrate  clinically significant improvement in LE strength.    Baseline 10/21/21: 16.3 sec without UE support    Time 12    Period Weeks    Status New    Target Date 01/13/22      PT LONG TERM GOAL #4   Title Pt will improve 10MWT gait speed to >1.0 m/s with least restrictive AD to show clinically significant improvement and decrease risk of falls.    Baseline 10/21/21: 0.82 m/s with 2WW and CGA    Time 12    Period Weeks    Status New    Target Date 01/13/22      PT LONG TERM GOAL #5   Title Pt will increase 6MWT by at least 31m (13ft) in order to demonstrate clinically significant improvement in cardiopulmonary endurance and community ambulation    Baseline 10/21/21: deferred to next visit; 10/27/2021= 790 feet with front wheeled walker    Time 12    Period Weeks    Status New    Target Date 01/13/22                   Plan - 11/24/21 1056     Clinical Impression Statement Pt returns to PT following absence due to illness where pt sought care in ED and was diagnosed with warm autoimmune hemolytic anemia. Pt monitored for response to interventions throughout, where interventions focused on gentle balance exercises with frequent rest breaks. Pt's BP at start of sesion read 134/114 mmHg (likely error of set-up). PT readjusted cuff and retook BP two minutes later where pt BP read 121/73 mmHg. Pt reported no symptoms with both readings. Pt did experience one bout of dizziness when looking down at her feet during hurdle/obstacle clearance intervention. Pt had difficulty describing the nature of her dizziness, denied lightheadedness, but did report it improved with sitting down. The pt will benefit from further skilled PT to improve  balance, strength, and general mobility to increase QOL and decrease fall risk.    Personal Factors and Comorbidities Age;Comorbidity 1;Sex;Time since onset of injury/illness/exacerbation;Fitness;Social Background    Comorbidities HLD    Examination-Activity  Limitations Bathing;Bed Mobility;Caring for Others;Reach Overhead;Hygiene/Grooming;Self Feeding;Squat;Stairs;Transfers;Toileting;Sleep;Locomotion Level;Lift;Stand    Examination-Participation Restrictions Cleaning;Community Activity;Shop;Laundry;Meal Prep;Volunteer;Driving;Interpersonal Relationship    Stability/Clinical Decision Making Stable/Uncomplicated    Rehab Potential Good    PT Frequency 2x / week    PT Duration 12 weeks    PT Treatment/Interventions ADLs/Self Care Home Management;Cryotherapy;Electrical Stimulation;Moist Heat;Ultrasound;Gait training;Stair training;DME Instruction;Functional mobility training;Therapeutic activities;Therapeutic exercise;Balance training;Neuromuscular re-education;Patient/family education;Manual techniques;Energy conservation;Passive range of motion;Joint Manipulations;Splinting;Taping;Vestibular;Visual/perceptual remediation/compensation;Wheelchair mobility training;Canalith Repostioning    PT Next Visit Plan Progress dynamic balance, strengthening as pt is able, continue to monitor BP    PT Home Exercise Plan 10/27/2021=Access Code: [email or web address]    Consulted and Agree with Plan of Care Patient             Patient will benefit from skilled therapeutic intervention in order to improve the following deficits and impairments:  Abnormal gait, Decreased activity tolerance, Decreased endurance, Decreased knowledge of use of DME, Decreased strength, Impaired sensation, Hypomobility, Decreased range of motion, Impaired UE functional use, Improper body mechanics, Pain, Decreased balance, Decreased coordination, Decreased mobility, Decreased safety awareness, Difficulty walking, Impaired flexibility, Postural dysfunction  Visit Diagnosis: Unsteadiness on feet  Other abnormalities of gait and mobility  Muscle weakness (generalized)     Problem List Patient Active Problem List   Diagnosis Date Noted   History of stroke 11/13/2021   Essential hypertension  11/13/2021   DVT (deep venous thrombosis) ([place]) 11/13/2021   Adnexal mass 11/13/2021   Warm autoimmune hemolytic anemia (H[facility])    Hemolytic anemia ([place]) 11/12/2021   HTN (hypertension) 10/19/2021   Constipation 10/19/2021   Elevated cholesterol 10/15/2021   Acute renal injury ([place]) 10/15/2021   Acute ischemic left ACA stroke ([place]) 10/07/2021   Stroke (cerebrum) ([place]) 10/01/2021    Zollie Pee, PT 11/24/2021, 6:56 PM  [place] MAIN [facility] SERVICES [address] [place], [place], [address] Phone: [phone]   Fax:  [phone]  Name: Andrea Williamson MRN: [id number] Date of Birth: 1951-06-21

## 2021-11-25 ENCOUNTER — Inpatient Hospital Stay (HOSPITAL_BASED_OUTPATIENT_CLINIC_OR_DEPARTMENT_OTHER): Payer: Medicare Other | Admitting: Oncology

## 2021-11-25 ENCOUNTER — Inpatient Hospital Stay: Payer: Medicare Other | Attending: Oncology

## 2021-11-25 ENCOUNTER — Encounter: Payer: Self-pay | Admitting: Oncology

## 2021-11-25 ENCOUNTER — Other Ambulatory Visit: Payer: Self-pay

## 2021-11-25 ENCOUNTER — Telehealth: Payer: Self-pay | Admitting: *Deleted

## 2021-11-25 ENCOUNTER — Inpatient Hospital Stay: Payer: Medicare Other

## 2021-11-25 VITALS — BP 134/59 | HR 68

## 2021-11-25 VITALS — BP 128/89 | HR 67 | Temp 96.7°F | Resp 16 | Ht 62.0 in | Wt 152.5 lb

## 2021-11-25 DIAGNOSIS — Z8673 Personal history of transient ischemic attack (TIA), and cerebral infarction without residual deficits: Secondary | ICD-10-CM | POA: Diagnosis not present

## 2021-11-25 DIAGNOSIS — D5919 Other autoimmune hemolytic anemia: Secondary | ICD-10-CM | POA: Diagnosis not present

## 2021-11-25 DIAGNOSIS — Z79899 Other long term (current) drug therapy: Secondary | ICD-10-CM | POA: Insufficient documentation

## 2021-11-25 DIAGNOSIS — R531 Weakness: Secondary | ICD-10-CM | POA: Insufficient documentation

## 2021-11-25 DIAGNOSIS — Z5112 Encounter for antineoplastic immunotherapy: Secondary | ICD-10-CM | POA: Diagnosis not present

## 2021-11-25 DIAGNOSIS — M47817 Spondylosis without myelopathy or radiculopathy, lumbosacral region: Secondary | ICD-10-CM | POA: Insufficient documentation

## 2021-11-25 DIAGNOSIS — Z836 Family history of other diseases of the respiratory system: Secondary | ICD-10-CM | POA: Diagnosis not present

## 2021-11-25 DIAGNOSIS — N83291 Other ovarian cyst, right side: Secondary | ICD-10-CM | POA: Diagnosis not present

## 2021-11-25 DIAGNOSIS — R5383 Other fatigue: Secondary | ICD-10-CM | POA: Insufficient documentation

## 2021-11-25 DIAGNOSIS — Z87891 Personal history of nicotine dependence: Secondary | ICD-10-CM | POA: Diagnosis not present

## 2021-11-25 DIAGNOSIS — G9389 Other specified disorders of brain: Secondary | ICD-10-CM | POA: Diagnosis not present

## 2021-11-25 DIAGNOSIS — D251 Intramural leiomyoma of uterus: Secondary | ICD-10-CM | POA: Diagnosis not present

## 2021-11-25 DIAGNOSIS — Z5181 Encounter for therapeutic drug level monitoring: Secondary | ICD-10-CM

## 2021-11-25 DIAGNOSIS — I700 Atherosclerosis of aorta: Secondary | ICD-10-CM | POA: Diagnosis not present

## 2021-11-25 DIAGNOSIS — Z86718 Personal history of other venous thrombosis and embolism: Secondary | ICD-10-CM | POA: Insufficient documentation

## 2021-11-25 DIAGNOSIS — Z809 Family history of malignant neoplasm, unspecified: Secondary | ICD-10-CM | POA: Insufficient documentation

## 2021-11-25 DIAGNOSIS — I10 Essential (primary) hypertension: Secondary | ICD-10-CM | POA: Insufficient documentation

## 2021-11-25 DIAGNOSIS — Z7964 Long term (current) use of myelosuppressive agent: Secondary | ICD-10-CM | POA: Diagnosis not present

## 2021-11-25 DIAGNOSIS — I82401 Acute embolism and thrombosis of unspecified deep veins of right lower extremity: Secondary | ICD-10-CM | POA: Diagnosis not present

## 2021-11-25 DIAGNOSIS — I6782 Cerebral ischemia: Secondary | ICD-10-CM | POA: Diagnosis not present

## 2021-11-25 DIAGNOSIS — K429 Umbilical hernia without obstruction or gangrene: Secondary | ICD-10-CM | POA: Insufficient documentation

## 2021-11-25 DIAGNOSIS — E785 Hyperlipidemia, unspecified: Secondary | ICD-10-CM | POA: Insufficient documentation

## 2021-11-25 DIAGNOSIS — Z7901 Long term (current) use of anticoagulants: Secondary | ICD-10-CM | POA: Diagnosis not present

## 2021-11-25 DIAGNOSIS — Z79620 Long term (current) use of immunosuppressive biologic: Secondary | ICD-10-CM

## 2021-11-25 DIAGNOSIS — I82452 Acute embolism and thrombosis of left peroneal vein: Secondary | ICD-10-CM | POA: Diagnosis not present

## 2021-11-25 DIAGNOSIS — D5911 Warm autoimmune hemolytic anemia: Secondary | ICD-10-CM | POA: Diagnosis not present

## 2021-11-25 DIAGNOSIS — I82441 Acute embolism and thrombosis of right tibial vein: Secondary | ICD-10-CM | POA: Diagnosis not present

## 2021-11-25 LAB — CBC WITH DIFFERENTIAL/PLATELET
Abs Immature Granulocytes: 0.23 10*3/uL — ABNORMAL HIGH (ref 0.00–0.07)
Basophils Absolute: 0 10*3/uL (ref 0.0–0.1)
Basophils Relative: 0 %
Eosinophils Absolute: 0 10*3/uL (ref 0.0–0.5)
Eosinophils Relative: 0 %
HCT: 25.9 % — ABNORMAL LOW (ref 36.0–46.0)
Hemoglobin: 8.4 g/dL — ABNORMAL LOW (ref 12.0–15.0)
Immature Granulocytes: 2 %
Lymphocytes Relative: 17 %
Lymphs Abs: 2.1 10*3/uL (ref 0.7–4.0)
MCH: 42.2 pg — ABNORMAL HIGH (ref 26.0–34.0)
MCHC: 32.4 g/dL (ref 30.0–36.0)
MCV: 130.2 fL — ABNORMAL HIGH (ref 80.0–100.0)
Monocytes Absolute: 0.9 10*3/uL (ref 0.1–1.0)
Monocytes Relative: 7 %
Neutro Abs: 9.4 10*3/uL — ABNORMAL HIGH (ref 1.7–7.7)
Neutrophils Relative %: 74 %
Platelets: 301 10*3/uL (ref 150–400)
RBC: 1.99 MIL/uL — ABNORMAL LOW (ref 3.87–5.11)
RDW: 19.6 % — ABNORMAL HIGH (ref 11.5–15.5)
WBC: 12.7 10*3/uL — ABNORMAL HIGH (ref 4.0–10.5)
nRBC: 1.3 % — ABNORMAL HIGH (ref 0.0–0.2)

## 2021-11-25 LAB — RETIC PANEL
Immature Retic Fract: 33.7 % — ABNORMAL HIGH (ref 2.3–15.9)
RBC.: 1.97 MIL/uL — ABNORMAL LOW (ref 3.87–5.11)
Retic Count, Absolute: 338.4 10*3/uL — ABNORMAL HIGH (ref 19.0–186.0)
Retic Ct Pct: 17.2 % — ABNORMAL HIGH (ref 0.4–3.1)
Reticulocyte Hemoglobin: 40.8 pg (ref >27.9)

## 2021-11-25 LAB — COMPREHENSIVE METABOLIC PANEL
ALT: 55 U/L — ABNORMAL HIGH (ref 0–44)
AST: 21 U/L (ref 15–41)
Albumin: 4.1 g/dL (ref 3.5–5.0)
Alkaline Phosphatase: 66 U/L (ref 38–126)
Anion gap: 7 (ref 5–15)
BUN: 44 mg/dL — ABNORMAL HIGH (ref 8–23)
CO2: 24 mmol/L (ref 22–32)
Calcium: 9.2 mg/dL (ref 8.9–10.3)
Chloride: 103 mmol/L (ref 98–111)
Creatinine, Ser: 1.13 mg/dL — ABNORMAL HIGH (ref 0.44–1.00)
GFR, Estimated: 52 mL/min — ABNORMAL LOW (ref >60)
Glucose, Bld: 104 mg/dL — ABNORMAL HIGH (ref 70–99)
Potassium: 4.3 mmol/L (ref 3.5–5.1)
Sodium: 134 mmol/L — ABNORMAL LOW (ref 135–145)
Total Bilirubin: 2 mg/dL — ABNORMAL HIGH (ref 0.3–1.2)
Total Protein: 6.5 g/dL (ref 6.5–8.1)

## 2021-11-25 MED ORDER — SODIUM CHLORIDE 0.9 % IV SOLN
375.0000 mg/m2 | Freq: Once | INTRAVENOUS | Status: AC
  Administered 2021-11-25: 700 mg via INTRAVENOUS
  Filled 2021-11-25: qty 50

## 2021-11-25 MED ORDER — DIPHENHYDRAMINE HCL 25 MG PO CAPS
50.0000 mg | ORAL_CAPSULE | Freq: Once | ORAL | Status: AC
  Administered 2021-11-25: 50 mg via ORAL
  Filled 2021-11-25: qty 2

## 2021-11-25 MED ORDER — SODIUM CHLORIDE 0.9 % IV SOLN
Freq: Once | INTRAVENOUS | Status: AC
  Filled 2021-11-25: qty 250

## 2021-11-25 MED ORDER — SODIUM CHLORIDE 0.9 % IV SOLN: 375.0000 mg/m2 | Freq: Once | INTRAVENOUS | Status: DC

## 2021-11-25 MED ORDER — ACETAMINOPHEN 325 MG PO TABS
650.0000 mg | ORAL_TABLET | Freq: Once | ORAL | Status: AC
  Administered 2021-11-25: 650 mg via ORAL
  Filled 2021-11-25: qty 2

## 2021-11-25 NOTE — Telephone Encounter (Signed)
Call returned to [facility] and she agrees to call neurologist also

## 2021-11-25 NOTE — Telephone Encounter (Signed)
She needs to call neurology to find that out. Judeen Hammans will also try getting in touch with them. Its neurology decision to do or not to do

## 2021-11-25 NOTE — Progress Notes (Signed)
Patient tolerated first dose ruxience on 1/2 - ok to do rapid infusion per MD

## 2021-11-25 NOTE — Progress Notes (Signed)
Hematology/Oncology Consult note [facility]  Telephone:([phone] [phone]  Patient Care Team: Pcp, No as PCP - General   Name of the patient: Andrea Williamson  [id number]  1951-04-29   Date of visit: 11/25/21  Diagnosis- warm autoimmune hemolytic anemia  Chief complaint/ Reason for visit- routine f/u of hemolytic anemia to receive 2nd dose of rituxan  Heme/Onc history: patient is a 71 year old female with a past medical history significant for hypertension and hyperlipidemia who was admitted to [facility] for symptoms of acute stroke when she presented with right lower extremity weakness and difficulty speaking on 10/01/2021.  CT head showed acute infarct in the left frontal lobe ACA territory.  She underwent cerebral angiogram and underwent thrombectomy with revascularization and left P2 stenting by Dr. Norma Fredrickson and subsequently discharged to rehab.  She was started on aspirin and Brilinta.  During that admission patient had a normal CBC with a white count of 9.3, H&H of 13/37.8 and a platelet count of 245.  Patient was noted to have increasing icterus as well as nausea and dizziness which prompted her visit to the ER.  On admission patient was noted to have a white cell count of 22, H&H of 4.5/13.4 and MCV of 111 with a platelet count of 352.  Total bilirubin was elevated at 6 thereby raising the concern for possible hemolytic anemia.  Patient was started on high-dose steroids and transferred to [facility].  She received 1 dose of rituximab there.  CT chest abdomen and pelvis with contrast did not show any evidence of malignancy but did show a possible Right adnexal mass which will be followed up by GYN as an outpatient.  Noted to have bilateral lower extremity DVT as well for which she is on Eliquis.  Interval history- doing well post discharge. Currently on prednisone 80 mg. Also on eliquis but is not taking brilinta  ECOG PS- 2 Pain scale- 2  Review of  systems- Review of Systems  Constitutional:  Positive for malaise/fatigue. Negative for chills, fever and weight loss.  HENT:  Negative for congestion, ear discharge and nosebleeds.   Eyes:  Negative for blurred vision.  Respiratory:  Negative for cough, hemoptysis, sputum production, shortness of breath and wheezing.   Cardiovascular:  Negative for chest pain, palpitations, orthopnea and claudication.  Gastrointestinal:  Negative for abdominal pain, blood in stool, constipation, diarrhea, heartburn, melena, nausea and vomiting.  Genitourinary:  Negative for dysuria, flank pain, frequency, hematuria and urgency.  Musculoskeletal:  Negative for back pain, joint pain and myalgias.  Skin:  Negative for rash.  Neurological:  Negative for dizziness, tingling, focal weakness, seizures, weakness and headaches.  Endo/Heme/Allergies:  Does not bruise/bleed easily.  Psychiatric/Behavioral:  Negative for depression and suicidal ideas. The patient does not have insomnia.       No Known Allergies   Past Medical History:  Diagnosis Date   Deep vein thrombosis (DVT) ([place])    Hypertension    Stroke [facility])      Past Surgical History:  Procedure Laterality Date   ANKLE SURGERY Left 2008   IR CT HEAD LTD  10/05/2021   IR INTRA CRAN STENT  10/01/2021   IR PERCUTANEOUS ART THROMBECTOMY/INFUSION INTRACRANIAL INC DIAG ANGIO  10/01/2021   IR US GUIDE VASC ACCESS RIGHT  10/01/2021   RADIOLOGY WITH ANESTHESIA N/A 10/01/2021   Procedure: IR WITH ANESTHESIA;  Surgeon: Radiologist, Medication, MD;  Location: [place];  Service: Radiology;  Laterality: N/A;  Social History   Socioeconomic History   Marital status: Married    Spouse name: Not on file   Number of children: 3   Years of education: Not on file   Highest education level: 12th grade  Occupational History   Occupation: Retired  Tobacco Use   Smoking status: Former    Packs/day: 1.50    Years: 50.00    Pack years: 75.00    Types:  Cigarettes    Quit date: 09/2021    Years since quitting: 0.1   Smokeless tobacco: Never  Vaping Use   Vaping Use: Never used  Substance and Sexual Activity   Alcohol use: Not Currently   Drug use: Not Currently   Sexual activity: Not Currently  Other Topics Concern   Not on file  Social History Narrative   October 2022 stopped   Social Determinants of Health   Financial Resource Strain: Not on file  Food Insecurity: Not on file  Transportation Needs: Not on file  Physical Activity: Not on file  Stress: Not on file  Social Connections: Not on file  Intimate Partner Violence: Not on file    Family History  Problem Relation Age of Onset   COPD Mother    Cancer Mother      Current Outpatient Medications:    acetaminophen (TYLENOL) 325 MG tablet, Take 1-2 tablets (325-650 mg total) by mouth every 4 (four) hours as needed for mild pain., Disp: , Rfl:    ALPRAZolam (XANAX) 0.25 MG tablet, Take 1 tablet (0.25 mg total) by mouth at bedtime as needed for anxiety. Take 1/2 tablet at bedtime for difficulty sleeping., Disp: 15 tablet, Rfl: 0   amLODipine (NORVASC) 5 MG tablet, Take 1 tablet (5 mg total) by mouth daily., Disp: 90 tablet, Rfl: 1   apixaban (ELIQUIS) 5 MG TABS tablet, Take 1 tablet (5 mg total) by mouth 2 (two) times daily., Disp: 60 tablet, Rfl: 1   atorvastatin (LIPITOR) 80 MG tablet, Take 1 tablet (80 mg total) by mouth daily., Disp: 90 tablet, Rfl: 1   ferrous sulfate 325 (65 FE) MG tablet, Take 325 mg by mouth daily with breakfast., Disp: , Rfl:    folic acid (FOLVITE) 1 MG tablet, Take 1 tablet (1 mg total) by mouth daily., Disp: , Rfl:    magnesium gluconate (MAGONATE) 500 MG tablet, Take 0.5 tablets (250 mg total) by mouth at bedtime., Disp: 30 tablet, Rfl: 0   ondansetron (ZOFRAN-ODT) 4 MG disintegrating tablet, Take 1 tablet (4 mg total) by mouth once as needed for nausea., Disp: 20 tablet, Rfl: 0   pantoprazole (PROTONIX) 40 MG tablet, Take 1 tablet (40 mg  total) by mouth daily., Disp: 30 tablet, Rfl: 1   predniSONE (DELTASONE) 20 MG tablet, Take 4 tablets (80 mg total) by mouth daily with breakfast., Disp: 120 tablet, Rfl: 0   hydrALAZINE (APRESOLINE) 25 MG tablet, Take 1 tablet (25 mg total) by mouth every 8 (eight) hours as needed (SBP >170 or DBP >110). (Patient not taking: Reported on 11/25/2021), Disp: , Rfl:   Physical exam:  Vitals:   11/25/21 0906  BP: 128/89  Pulse: 67  Resp: 16  Temp: (!) 96.7 F (35.9 C)  TempSrc: Tympanic  Weight: 152 lb 8 oz (69.2 kg)  Height: 5\' 2"  (1.575 m)   Physical Exam Constitutional:      General: She is not in acute distress. Eyes:     Comments: Mild scleral icterus  Cardiovascular:     Rate  and Rhythm: Normal rate and regular rhythm.     Heart sounds: Normal heart sounds.  Pulmonary:     Effort: Pulmonary effort is normal.     Breath sounds: Normal breath sounds.  Abdominal:     General: Bowel sounds are normal.     Palpations: Abdomen is soft.  Skin:    General: Skin is warm and dry.  Neurological:     Mental Status: She is alert and oriented to person, place, and time.     CMP Latest Ref Rng & Units 11/25/2021  Glucose 70 - 99 mg/dL 104(H)  BUN 8 - 23 mg/dL 44(H)  Creatinine 0.44 - 1.00 mg/dL 1.13(H)  Sodium 135 - 145 mmol/L 134(L)  Potassium 3.5 - 5.1 mmol/L 4.3  Chloride 98 - 111 mmol/L 103  CO2 22 - 32 mmol/L 24  Calcium 8.9 - 10.3 mg/dL 9.2  Total Protein 6.5 - 8.1 g/dL 6.5  Total Bilirubin 0.3 - 1.2 mg/dL 2.0(H)  Alkaline Phos 38 - 126 U/L 66  AST 15 - 41 U/L 21  ALT 0 - 44 U/L 55(H)   CBC Latest Ref Rng & Units 11/25/2021  WBC 4.0 - 10.5 K/uL 12.7(H)  Hemoglobin 12.0 - 15.0 g/dL 8.4(L)  Hematocrit 36.0 - 46.0 % 25.9(L)  Platelets 150 - 400 K/uL 301    No images are attached to the encounter.  CT Head Wo Contrast  Result Date: 11/12/2021 CLINICAL DATA:  Dizziness nausea vomiting EXAM: CT HEAD WITHOUT CONTRAST TECHNIQUE: Contiguous axial images were obtained from  the base of the skull through the vertex without intravenous contrast. COMPARISON:  CT brain 10/01/2021 FINDINGS: Brain: No acute territorial infarction, hemorrhage, or intracranial mass. Scattered encephalomalacia within the left frontal lobe and white matter corresponding to infarcts on previous MRI. Mild atrophy. Chronic small vessel ischemic changes of the white matter. Vague hypodensity within the left cerebellum corresponding to small infarct on prior MRI. Vascular: Left A2 stent as before.  Carotid vascular calcifications. Skull: Normal. Negative for fracture or focal lesion. Sinuses/Orbits: No acute finding. Other: None IMPRESSION: 1. No CT evidence for acute intracranial abnormality. Atrophy and chronic small vessel ischemic changes of the white matter 2. Multiple small chronic appearing left ACA infarcts as noted on previous imaging. Electronically Signed   By: Donavan Foil M.D.   On: 11/12/2021 22:40   MR PELVIS W WO CONTRAST  Result Date: 11/18/2021 CLINICAL DATA:  Right adnexal mass on prior ultrasound. EXAM: MRI PELVIS WITHOUT AND WITH CONTRAST TECHNIQUE: Multiplanar multisequence MR imaging of the pelvis was performed both before and after administration of intravenous contrast. CONTRAST:  20mL GADAVIST GADOBUTROL 1 MMOL/ML IV SOLN COMPARISON:  Ultrasound and CTs of 11/13/2021. FINDINGS: Urinary Tract: Normal urinary bladder. No distal hydroureter. Normal imaged kidneys. Bowel: Normal pelvic bowel loops. Vascular/Lymphatic: Aortic atherosclerosis. No pelvic aneurysm or sidewall adenopathy. Reproductive: Uterus: Measures 5.6 x 4.3 by 6.7 cm (volume = 84 cm^3). Demonstrates a T2 hypointense left-sided intramural lesion which is most consistent with a fibroid. This avidly enhances. No endometrial thickening. Right ovary: Corresponding to the ultrasound abnormality, a complex cystic lesion within the right ovary demonstrates T2 hyperintensity and T1 hyperintensity at 2.9 x 3.0 cm on 18/3. Anteriorly,  there is a 1.0 cm enhancing soft tissue component on 42/26 which corresponds to the ultrasound area of vascularity. The remaining more cephalad and anterior soft tissue signal including at 1.7 x 2.6 cm on 38/26 is favored to represent normal ovarian parenchyma. Left ovary:  Normal, including on  12/03. Other: No significant free fluid. Musculoskeletal: Lumbosacral spondylosis, including discogenic edema at L4-5. A T2 hyperintense right iliac lesion of 2.0 cm on 08/03 is consistent with a hemangioma. IMPRESSION: 1. Complex solid and cystic 2.9 x 3.0 cm right ovarian mass which (especially given the ultrasound appearance) is highly suspicious for cystic neoplasm. Consider gynecological oncology consultation. 2. Left-sided uterine fibroid. Electronically Signed   By: Abigail Miyamoto M.D.   On: 11/18/2021 20:28   CT CHEST ABDOMEN PELVIS W CONTRAST  Result Date: 11/13/2021 CLINICAL DATA:  Occult malignancy.  Nausea. EXAM: CT CHEST, ABDOMEN, AND PELVIS WITH CONTRAST TECHNIQUE: Multidetector CT imaging of the chest, abdomen and pelvis was performed following the standard protocol during bolus administration of intravenous contrast. CONTRAST:  172mL OMNIPAQUE IOHEXOL 300 MG/ML  SOLN COMPARISON:  None. FINDINGS: CT CHEST FINDINGS Cardiovascular: Heart size is normal. Coronary artery calcifications are present. The appearance of the pulmonary arteries is unremarkable accounting for the contrast bolus timing. There is atherosclerotic calcification of the thoracic aorta, not associated with aneurysm. Mediastinum/Nodes: The visualized portion of the thyroid gland has a normal appearance. There is mild thickening of the LOWER esophageal wall, not associated with discrete mass. No significant mediastinal, hilar, or axillary adenopathy. Lungs/Pleura: Minimal LEFT apical pleuroparenchymal changes. No pulmonary nodules. No consolidations or pleural effusions. Musculoskeletal: Mild midthoracic degenerative changes. CT ABDOMEN PELVIS  FINDINGS Hepatobiliary: Mildly prominent RIGHT hepatic lobe, variation of normal. No focal hepatic lesions. Normal parenchymal density. Gallbladder is present. Pancreas: Unremarkable. No pancreatic ductal dilatation or surrounding inflammatory changes. Spleen: Normal in size without focal abnormality. Adrenals/Urinary Tract: Adrenal glands are normal in appearance. A 1 centimeter benign cyst is identified in the UPPER pole of the RIGHT kidney. LEFT kidney is unremarkable. Ureters are unremarkable. The bladder and visualized portion of the urethra are normal. Stomach/Bowel: Mild thickening of the LOWER esophageal wall. Stomach is otherwise unremarkable. Small bowel loops are normal in appearance. Moderate stool burden identified within the rectosigmoid colon. Large bowel is otherwise unremarkable. The appendix is well seen and normal in appearance. Vascular/Lymphatic: There is mild, nonspecific stranding within the mesentery in the RIGHT UPPER QUADRANT. Small lymph nodes are identified within this region, largest measuring 7 millimeters in short axis, image 76 of series 2. No retroperitoneal adenopathy. There is atherosclerotic calcification of the abdominal aorta, not associated with aneurysm. There is normal vascular opacification of the celiac axis, superior mesenteric artery, and inferior mesenteric artery. Normal appearance of the portal venous system and inferior vena cava. Reproductive: The uterus is present. A central lobulated enhancing mass likely represents fibroid, measuring 3.0 centimeters on image 105 of series 2. The RIGHT adnexal region is notable for a solid and cystic mass measuring 4.9 x 3.4 centimeters on image 102 of series 2. LEFT adnexal region is unremarkable. No ascites. Other: Anterior abdominal wall is notable for fat containing paraumbilical hernia. Musculoskeletal: No acute or significant osseous findings. IMPRESSION: 1. No acute abnormality of the chest. 2. Solid and cystic mass within  the RIGHT adnexa, likely representing ovarian lesion measuring 4.9 centimeters. Recommend further characterization with pelvic ultrasound. 3. Nonspecific mesenteric stranding in the RIGHT UPPER QUADRANT, associated subcentimeter lymph nodes. Consider follow-up in 3-6 months to assess for stability. 4.  Aortic atherosclerosis.  (ICD10-I70.0) 5. RIGHT renal cyst. 6. Uterine fibroid. 7. Mild thickening of the LOWER esophagus, raising question of esophagitis. Electronically Signed   By: Nolon Nations M.D.   On: 11/13/2021 08:23   US Venous Img Lower Bilateral (DVT)  Result Date:  11/13/2021 CLINICAL DATA:  Hemolytic anemia EXAM: BILATERAL LOWER EXTREMITY VENOUS DOPPLER ULTRASOUND TECHNIQUE: Gray-scale sonography with compression, as well as color and duplex ultrasound, were performed to evaluate the deep venous system(s) from the level of the common femoral vein through the popliteal and proximal calf veins. COMPARISON:  None. FINDINGS: VENOUS Normal compressibility, phasicity, and augmentation of the common femoral, superficial femoral, and popliteal veins bilaterally. However, there is occlusive thrombus in the right posterior tibial vein, and occlusive thrombus in the left peroneal vein. OTHER None. Limitations: none IMPRESSION: 1. Today's exam is positive for bilateral below knee deep vein thrombosis (BKDVT), with occlusive thrombus in the right posterior tibial vein and in the left peroneal vein. Electronically Signed   By: Van Clines M.D.   On: 11/13/2021 09:19   US PELVIC COMPLETE WITH TRANSVAGINAL  Result Date: 11/13/2021 CLINICAL DATA:  Right adnexal mass seen in the CT done earlier today EXAM: TRANSABDOMINAL AND TRANSVAGINAL ULTRASOUND OF PELVIS DOPPLER ULTRASOUND OF OVARIES TECHNIQUE: Both transabdominal and transvaginal ultrasound examinations of the pelvis were performed. Transabdominal technique was performed for global imaging of the pelvis including uterus, ovaries, adnexal regions,  and pelvic cul-de-sac. It was necessary to proceed with endovaginal exam following the transabdominal exam to visualize the ovaries. Color and duplex Doppler ultrasound was utilized to evaluate blood flow to the ovaries. COMPARISON:  CT done earlier today FINDINGS: Uterus Measurements: 5.9 x 2.4 x 5 cm = volume: 37.2 mL. There is 4 x 2.7 x 2.7 cm fibroid in the left side of body. Endometrium Thickness: 2.9 mm.  No focal abnormality visualized. Right ovary Measurements: 3.9 x 2.9 x 3.4 cm = volume: 20.15 mL. Is a complex right adnexal lesion. Lesion is mostly cystic with a lobulated solid component in the margin. There is demonstrable vascular flow in the solid component. Cystic portion of the lesion measures 3.9 x 2.6 x 3.3 cm. Solid component in the margin measures 1.4 x 0.8 x 1.2 cm. Left ovary Not sonographically visualized. Pulsed Doppler evaluation of right ovary demonstrates normal low-resistance arterial and venous waveforms. Other findings No abnormal free fluid. IMPRESSION: There is a complex lesion in the right adnexa with mostly cystic echogenicity along with a lobulated solid nodule measuring 1.4 cm in size with demonstrable internal vascularity. Possibility of malignant neoplastic process in the right ovary is not excluded. 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 Left ovary is not sonographically visualized. Electronically Signed   By: Elmer Picker M.D.   On: 11/13/2021 12:26     Assessment and plan- Patient is a 71 y.o. female with warm autoimmune hemolytic anemia here for dose 2 of rituxan  Hb slightly better at 8.4. bilirubin decreased to 2 from baseline of 6. She will receive dose of rituxan today. I would like her to stay on prednisone 80 mg this week. Will check hemolysis panel again next week when she comes for dose 3 of rituxan. If hemoglobin continues to improve, will initiate slow taper of prednsione I will see her in 2 weeks for dose 4 of  rituxan  H/o stroke- she stopped her brilinta. Will reach out to neurology and see what they would like her to do about brilinta  B/l LE DVT: continue eliquis     Visit Diagnosis 1. Other autoimmune hemolytic anemia (HCC)   2. Encounter for monitoring rituximab therapy   3. Deep vein thrombosis (DVT) of right lower extremity, unspecified chronicity, unspecified vein (HCC)      Dr. Astrid Divine  Janese Banks, MD, MPH [place] [facility] [phone] 11/25/2021 12:29 PM

## 2021-11-25 NOTE — Telephone Encounter (Signed)
[name] called asking if patient is to take Brilnta or not Please advise

## 2021-11-25 NOTE — Progress Notes (Signed)
Pt doing ok she eats good and had good BM. She started ferrous sulfate once a day this week. She has numbness of right hand the pointer finger and next one beside of pointer finger- it is chronic issue

## 2021-11-26 ENCOUNTER — Ambulatory Visit (INDEPENDENT_AMBULATORY_CARE_PROVIDER_SITE_OTHER): Payer: Medicare Other

## 2021-11-26 ENCOUNTER — Ambulatory Visit: Payer: Medicare Other

## 2021-11-26 DIAGNOSIS — R2681 Unsteadiness on feet: Secondary | ICD-10-CM | POA: Diagnosis not present

## 2021-11-26 DIAGNOSIS — Z0000 Encounter for general adult medical examination without abnormal findings: Secondary | ICD-10-CM

## 2021-11-26 DIAGNOSIS — R2689 Other abnormalities of gait and mobility: Secondary | ICD-10-CM

## 2021-11-26 DIAGNOSIS — M6281 Muscle weakness (generalized): Secondary | ICD-10-CM

## 2021-11-26 DIAGNOSIS — R278 Other lack of coordination: Secondary | ICD-10-CM | POA: Diagnosis not present

## 2021-11-26 LAB — HAPTOGLOBIN: Haptoglobin: <10 mg/dL — ABNORMAL LOW (ref 37–355)

## 2021-11-26 NOTE — Therapy (Signed)
[place] [facility] SERVICES [address] [place], [place], [address] Phone: [phone]   Fax:  [phone]  Physical Therapy Treatment  Patient Details  Name: Andrea Williamson MRN: [id number] Date of Birth: 1951/04/02 Referring Provider (PT): Reesa Chew, [place]   Encounter Date: 11/26/2021   PT End of Session - 11/26/21 1916     Visit Number 9    Number of Visits 25    Date for PT Re-Evaluation 01/13/22    Authorization Time Period Eval performed: 10/21/21    Progress Note Due on Visit 10    PT Start Time 0930    PT Stop Time 1012    PT Time Calculation (min) 42 min    Equipment Utilized During Treatment Gait belt    Activity Tolerance Patient tolerated treatment well    Behavior During Therapy [facility] for tasks assessed/performed             Past Medical History:  Diagnosis Date   Deep vein thrombosis (DVT) ([place])    Hypertension    Stroke [facility])     Past Surgical History:  Procedure Laterality Date   ANKLE SURGERY Left 2008   IR CT HEAD LTD  10/05/2021   IR INTRA CRAN STENT  10/01/2021   IR PERCUTANEOUS ART THROMBECTOMY/INFUSION INTRACRANIAL INC DIAG ANGIO  10/01/2021   IR US GUIDE VASC ACCESS RIGHT  10/01/2021   RADIOLOGY WITH ANESTHESIA N/A 10/01/2021   Procedure: IR WITH ANESTHESIA;  Surgeon: Radiologist, Medication, MD;  Location: [place];  Service: Radiology;  Laterality: N/A;    There were no vitals filed for this visit.   Subjective Assessment - 11/26/21 0928     Subjective Pt reports no pain currently. Pt reports no falls/LOB since previous session. Pt reports some dizziness since last session. Pt reports her dizziness goes away quickly and occurs with rolling "out of bed" and with head movement. Pt reports she is on an iron pill and that seems to be helping her dizziness. Pt reports she does not like coming to PT because it makes her feel frustated. She reports she is more easily agitated lately. She reports she is  frustrated because she was healthy prior to her stroke.    Patient is accompained by: Family member    Pertinent History Pt is a 71 y/o female presenting with R LE strength and balance deficits. Pt is s/p L hemisphere CVA, admitted to the ED 10/01/21. Imaging included MRI scans on 10/02/21 yielding "Redemonstrated infarcts in the left ACA and ACA/PCA watershed  territory, with one new area of restricted diffusion in the medial right parietal lobe, likely an additional acute infarct, status post  A2 stenting. A small amount of associated petechial hemorrhage is  noted within the medial left frontal lobe, without significant  hemorrhagic transformation." per Dr. Merilyn Baba, MD. Pt has PMH that includes HLD.    Limitations Walking;Standing    How long can you sit comfortably? "a long time"    How long can you stand comfortably? 30 minutes to an hour    How long can you walk comfortably? 7 minutes    Diagnostic tests CT (10/01/21) - 1. The right common femoral artery has normal caliber, adequate for   vascular access.   2. 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   3. Intracranial atherosclerotic disease with multifocal areas of   mild stenosis in the left ACA and MCA vascular trees. MRI HEAD WITHOUT CONTRAST (10/12/21) - "Redemonstrated  infarcts in the left ACA and ACA/PCA watershed  territory, with one new area of restricted diffusion in the medial right parietal lobe, likely an additional acute infarct, status post  A2 stenting. 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 No/denies              INTERVENTIONS-  Neuro Re-ED: Gait belt donned throughout and CGA provided unless otherwise specified  Pt performs several minutes of the following balance  interventions:  NBOS, FIRM SURFACE- EO - no LOB, no increase in sway EO with vertical head turns - increased sway EO with horizontal head turns -increased sway EC - challenging, ankle/hip strategy used. Pt grabs onto bar 1x to take a break as pt reports intervention is frustrating.   Pt standing on airex pad - NBOS EO - no significant increase in sway EO with vertical head turns EO with horizontal head turns  EC - pt rates as medium Comments: compliant surface with addition of head turns or EC significantly more challenging to pt. While pt does improve postural stability with reps, she required up to min assist to maintain balance. Pt bent over at end of intervention to rest head on balance bar. Upon standing upright pt reported brief dizziness that resolved quickly with seated rest.   One cone placed in front of patient, patient performs cone taps with decreasing levels of UE support until pt performs with no UE support. Pt reports intervention as fatiguing. Pt only knocks over cone 1x.   TherEx:  Ambulation for LE muscular and cardiorespiratory endurance. PT provides CGA while pt ambulates for 4 laps (1 lap = approx 148 ft). Additional cuing was provided for heel-toe sequencing/heel strike as pt exhibited B foot drag. While pt reports intervention as fairly easy, pt also reports it as fatiguing and that abdominal muscles feel as if they start to "grip" with fatigue. Pt reports this sensation resolves with rest.      Pt educated throughout session about proper posture and technique with exercises. Improved exercise technique, movement at target joints, use of target muscles after min to mod verbal, visual, tactile cues.      PT Education - 11/26/21 1916     Education Details purpose of interventions/benefit of PT, expected progression with interventions, exercise technique, body mechanics    Person(s) Educated Patient;Caregiver(s)   caregiver: SIL   Methods  Explanation;Demonstration;Verbal cues    Comprehension Verbalized understanding;Returned demonstration;Verbal cues required;Tactile cues required;Need further instruction              PT Short Term Goals - 10/22/21 0759       PT SHORT TERM GOAL #1   Title Pt will be independent with HEP in order to improve strength and balance in order to decrease fall risk and improve function at home and work.    Baseline 10/21/21: HEP deferred to next visit    Time 6    Period Weeks    Status New    Target Date 12/02/21               PT Long Term Goals - 10/27/21 1440       PT LONG TERM GOAL #1   Title Pt will improve FOTO score to >71% to show  improved functional mobility with ADLs    Baseline 10/21/21: 58%    Time 12    Period Weeks    Status New    Target Date 01/13/22      PT LONG TERM GOAL #2   Title Pt will improve BERG by at least 3 points in order to demonstrate clinically significant improvement in balance.    Baseline 10/21/21: 44/56    Time 12    Period Weeks    Status New    Target Date 01/13/22      PT LONG TERM GOAL #3   Title Pt will decrease 5TSTS by at least 3 seconds in order to demonstrate clinically significant improvement in LE strength.    Baseline 10/21/21: 16.3 sec without UE support    Time 12    Period Weeks    Status New    Target Date 01/13/22      PT LONG TERM GOAL #4   Title Pt will improve 10MWT gait speed to >1.0 m/s with least restrictive AD to show clinically significant improvement and decrease risk of falls.    Baseline 10/21/21: 0.82 m/s with 2WW and CGA    Time 12    Period Weeks    Status New    Target Date 01/13/22      PT LONG TERM GOAL #5   Title Pt will increase 6MWT by at least 3m (141ft) in order to demonstrate clinically significant improvement in cardiopulmonary endurance and community ambulation    Baseline 10/21/21: deferred to next visit; 10/27/2021= 790 feet with front wheeled walker    Time 12    Period Weeks     Status New    Target Date 01/13/22                   Plan - 11/26/21 1926     Clinical Impression Statement Pt reported frustration and agitation throughout today's session. However, pt still participated in all interventions. The pt was able to perform firm surface balance exercises with minimal difficulty and was most challenged with EC on compliant surface. The pt is still limited by fatigue. The pt will benefit from further skilled PT to improve balance, strength and mobility to increase QOL and return to PLOF.    Personal Factors and Comorbidities Age;Comorbidity 1;Sex;Time since onset of injury/illness/exacerbation;Fitness;Social Background    Comorbidities HLD    Examination-Activity Limitations Bathing;Bed Mobility;Caring for Others;Reach Overhead;Hygiene/Grooming;Self Feeding;Squat;Stairs;Transfers;Toileting;Sleep;Locomotion Level;Lift;Stand    Examination-Participation Restrictions Cleaning;Community Activity;Shop;Laundry;Meal Prep;Volunteer;Driving;Interpersonal Relationship    Stability/Clinical Decision Making Stable/Uncomplicated    Rehab Potential Good    PT Frequency 2x / week    PT Duration 12 weeks    PT Treatment/Interventions ADLs/Self Care Home Management;Cryotherapy;Electrical Stimulation;Moist Heat;Ultrasound;Gait training;Stair training;DME Instruction;Functional mobility training;Therapeutic activities;Therapeutic exercise;Balance training;Neuromuscular re-education;Patient/family education;Manual techniques;Energy conservation;Passive range of motion;Joint Manipulations;Splinting;Taping;Vestibular;Visual/perceptual remediation/compensation;Wheelchair mobility training;Canalith Repostioning    PT Next Visit Plan Progress dynamic balance, strengthening as pt is able, continue to monitor BP    PT Home Exercise Plan 10/27/2021=Access Code: [email or web address]    Consulted and Agree with Plan of Care Patient             Patient will benefit from skilled therapeutic  intervention in order to improve the following deficits and impairments:  Abnormal gait, Decreased activity tolerance, Decreased endurance, Decreased knowledge of use of DME, Decreased strength, Impaired sensation, Hypomobility, Decreased range of motion, Impaired UE functional use, Improper body mechanics, Pain, Decreased balance, Decreased coordination, Decreased mobility, Decreased safety awareness, Difficulty walking, Impaired flexibility,  Postural dysfunction  Visit Diagnosis: Muscle weakness (generalized)  Unsteadiness on feet  Other abnormalities of gait and mobility     Problem List Patient Active Problem List   Diagnosis Date Noted   History of stroke 11/13/2021   Essential hypertension 11/13/2021   DVT (deep venous thrombosis) ([place]) 11/13/2021   Adnexal mass 11/13/2021   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

## 2021-11-26 NOTE — Progress Notes (Signed)
Subjective:   Andrea Williamson is a 71 y.o. female who presents for Medicare Annual (Subsequent) preventive examination. I discussed the limitations of evaluation and management by telemedicine and the availability of in person appointments. The patient expressed understanding and agreed to proceed.   Visit performed by audio   Patient location: Home  Provider location: Home  Review of Systems    N/A       Objective:    Today's Vitals   11/26/21 1131  PainSc: 0-No pain   There is no height or weight on file to calculate BMI.  Advanced Directives 11/26/2021 11/25/2021 11/13/2021 11/12/2021 10/21/2021 10/07/2021 10/01/2021  Does Patient Have a Medical Advance Directive? Yes Yes Yes Yes No No No  Type of Paramedic of [place];Living will [place];Living will Living will;Healthcare Power of Attorney - - - -  Does patient want to make changes to medical advance directive? No - Patient declined - No - Patient declined - - - -  Copy of [place] in Chart? No - copy requested No - copy requested No - copy requested - - - -  Would patient like information on creating a medical advance directive? - No - Patient declined - - No - Patient declined No - Patient declined No - Patient declined  Some encounter information is confidential and restricted. Go to Review Flowsheets activity to see all data.    Current Medications (verified) Outpatient Encounter Medications as of 11/26/2021  Medication Sig   acetaminophen (TYLENOL) 325 MG tablet Take 1-2 tablets (325-650 mg total) by mouth every 4 (four) hours as needed for mild pain.   ALPRAZolam (XANAX) 0.25 MG tablet Take 1 tablet (0.25 mg total) by mouth at bedtime as needed for anxiety. Take 1/2 tablet at bedtime for difficulty sleeping.   amLODipine (NORVASC) 5 MG tablet Take 1 tablet (5 mg total) by mouth daily.   apixaban (ELIQUIS) 5 MG TABS tablet Take 1 tablet (5 mg total) by mouth  2 (two) times daily.   atorvastatin (LIPITOR) 80 MG tablet Take 1 tablet (80 mg total) by mouth daily.   ferrous sulfate 325 (65 FE) MG tablet Take 325 mg by mouth daily with breakfast.   folic acid (FOLVITE) 1 MG tablet Take 1 tablet (1 mg total) by mouth daily.   hydrALAZINE (APRESOLINE) 25 MG tablet Take 1 tablet (25 mg total) by mouth every 8 (eight) hours as needed (SBP >170 or DBP >110).   magnesium gluconate (MAGONATE) 500 MG tablet Take 0.5 tablets (250 mg total) by mouth at bedtime.   ondansetron (ZOFRAN-ODT) 4 MG disintegrating tablet Take 1 tablet (4 mg total) by mouth once as needed for nausea.   pantoprazole (PROTONIX) 40 MG tablet Take 1 tablet (40 mg total) by mouth daily.   predniSONE (DELTASONE) 20 MG tablet Take 4 tablets (80 mg total) by mouth daily with breakfast.   No facility-administered encounter medications on file as of 11/26/2021.    Allergies (verified) Patient has no known allergies.   History: Past Medical History:  Diagnosis Date   Deep vein thrombosis (DVT) ([place])    Hypertension    Stroke [facility])    Past Surgical History:  Procedure Laterality Date   ANKLE SURGERY Left 2008   IR CT HEAD LTD  10/05/2021   IR INTRA CRAN STENT  10/01/2021   IR PERCUTANEOUS ART THROMBECTOMY/INFUSION INTRACRANIAL INC DIAG ANGIO  10/01/2021   IR US GUIDE VASC ACCESS RIGHT  10/01/2021  RADIOLOGY WITH ANESTHESIA N/A 10/01/2021   Procedure: IR WITH ANESTHESIA;  Surgeon: Radiologist, Medication, MD;  Location: [place];  Service: Radiology;  Laterality: N/A;   Family History  Problem Relation Age of Onset   COPD Mother    Cancer Mother    Social History   Socioeconomic History   Marital status: Married    Spouse name: Not on file   Number of children: 3   Years of education: 12   Highest education level: 12th grade  Occupational History   Occupation: Retired  Tobacco Use   Smoking status: Former    Packs/day: 1.50    Years: 50.00    Pack years: 75.00    Types:  Cigarettes    Quit date: 09/2021    Years since quitting: 0.1   Smokeless tobacco: Never  Vaping Use   Vaping Use: Never used  Substance and Sexual Activity   Alcohol use: Not Currently   Drug use: Not Currently   Sexual activity: Not Currently  Other Topics Concern   Not on file  Social History Narrative   October 2022 stopped   Social Determinants of Health   Financial Resource Strain: Low Risk    Difficulty of Paying Living Expenses: Not hard at all  Food Insecurity: No Food Insecurity   Worried About Charity fundraiser in the Last Year: Never true   Arboriculturist in the Last Year: Never true  Transportation Needs: No Transportation Needs   Lack of Transportation (Medical): No   Lack of Transportation (Non-Medical): No  Physical Activity: Insufficiently Active   Days of Exercise per Week: 2 days   Minutes of Exercise per Session: 60 min  Stress: Stress Concern Present   Feeling of Stress : To some extent  Social Connections: Moderately Isolated   Frequency of Communication with Friends and Family: More than three times a week   Frequency of Social Gatherings with Friends and Family: More than three times a week   Attends Religious Services: Never   Marine scientist or Organizations: No   Attends Music therapist: Never   Marital Status: Married    Tobacco Counseling Counseling given: Not Answered   Clinical Intake:  Pre-visit preparation completed: Yes  Pain : No/denies pain Pain Score: 0-No pain     Diabetes: No  How often do you need to have someone help you when you read instructions, pamphlets, or other written materials from your doctor or pharmacy?: 4 - Often What is the last grade level you completed in school?: 12th grade  Diabetic? No  Interpreter Needed?: No  Information entered by :: Anson [place] CMA   Activities of Daily Living In your present state of health, do you have any difficulty performing the following  activities: 11/26/2021 11/13/2021  Hearing? N N  Vision? N N  Difficulty concentrating or making decisions? N N  Walking or climbing stairs? N Y  Dressing or bathing? N Y  Doing errands, shopping? N N  Preparing Food and eating ? N -  Using the Toilet? N -  In the past six months, have you accidently leaked urine? N -  Do you have problems with loss of bowel control? N -  Managing your Medications? Y -  Managing your Finances? N -  Housekeeping or managing your Housekeeping? N -  Some encounter information is confidential and restricted. Go to Review Flowsheets activity to see all data.  Some recent data might be hidden  Patient Care Team: Pcp, No as PCP - General  Indicate any recent Medical Services you may have received from other than [facility] providers in the past year (date may be approximate).     Assessment:   This is a routine wellness examination for Fairy.  Hearing/Vision screen No results found.  Dietary issues and exercise activities discussed:     Goals Addressed   None    Depression Screen PHQ 2/9 Scores 11/26/2021  PHQ - 2 Score 1    Fall Risk Fall Risk  11/26/2021  Falls in the past year? 1  Number falls in past yr: 0  Injury with Fall? 1  Risk for fall due to : History of fall(s)  Follow up Falls evaluation completed    [place]:  Any stairs in or around the home? Yes  If so, are there any without handrails? No  Home free of loose throw rugs in walkways, pet beds, electrical cords, etc? Yes  Adequate lighting in your home to reduce risk of falls? Yes   ASSISTIVE DEVICES UTILIZED TO PREVENT FALLS:  Life alert? No  Use of a cane, walker or w/c? No  Grab bars in the bathroom? Yes  Shower chair or bench in shower? Yes  Elevated toilet seat or a handicapped toilet? No   TIMED UP AND GO:  Was the test performed? No .  Length of time to ambulate 10 feet: 0 sec.     Cognitive Function:         Immunizations  There is no immunization history on file for this patient.  TDAP status: Due, Education has been provided regarding the importance of this vaccine. Advised may receive this vaccine at local pharmacy or Health Dept. Aware to provide a copy of the vaccination record if obtained from local pharmacy or Health Dept. Verbalized acceptance and understanding.  Flu Vaccine status: Declined, Education has been provided regarding the importance of this vaccine but patient still declined. Advised may receive this vaccine at local pharmacy or Health Dept. Aware to provide a copy of the vaccination record if obtained from local pharmacy or Health Dept. Verbalized acceptance and understanding.  Pneumococcal vaccine status: Due, Education has been provided regarding the importance of this vaccine. Advised may receive this vaccine at local pharmacy or Health Dept. Aware to provide a copy of the vaccination record if obtained from local pharmacy or Health Dept. Verbalized acceptance and understanding.  Covid-19 vaccine status: Information provided on how to obtain vaccines.   Qualifies for Shingles Vaccine? Yes   Zostavax completed No   Shingrix Completed?: No.    Education has been provided regarding the importance of this vaccine. Patient has been advised to call insurance company to determine out of pocket expense if they have not yet received this vaccine. Advised may also receive vaccine at local pharmacy or Health Dept. Verbalized acceptance and understanding.  Screening Tests Health Maintenance  Topic Date Due   COVID-19 Vaccine (1) Never done   TETANUS/TDAP  Never done   COLONOSCOPY (Pts 45-87yrs Insurance coverage will need to be confirmed)  Never done   MAMMOGRAM  Never done   Zoster Vaccines- Shingrix (1 of 2) Never done   Pneumonia Vaccine 4+ Years old (1 - PCV) Never done   DEXA SCAN  Never done   INFLUENZA VACCINE  Never done   Hepatitis C Screening  Completed   HPV  [place]  Maintenance Due  Topic Date Due   COVID-19 Vaccine (1) Never done   TETANUS/TDAP  Never done   COLONOSCOPY (Pts 45-77yrs Insurance coverage will need to be confirmed)  Never done   MAMMOGRAM  Never done   Zoster Vaccines- Shingrix (1 of 2) Never done   Pneumonia Vaccine 87+ Years old (1 - PCV) Never done   DEXA SCAN  Never done   INFLUENZA VACCINE  Never done    Colorectal cancer screening: Type of screening: Colonoscopy. Completed No Pt declined. Repeat every 10 years  Mammogram status: Completed No pt declined. Repeat every year   Lung Cancer Screening: (Low Dose CT Chest recommended if Age 23-80 years, 30 pack-year currently smoking OR have quit w/in 15years.) does qualify.   Lung Cancer Screening Referral: NO  Additional Screening:  Hepatitis C Screening: does qualify; Completed Yes  Vision Screening: Recommended annual ophthalmology exams for early detection of glaucoma and other disorders of the eye. Is the patient up to date with their annual eye exam?  No  Who is the provider or what is the name of the office in which the patient attends annual eye exams? N/A If pt is not established with a provider, would they like to be referred to a provider to establish care? Yes .   Dental Screening: Recommended annual dental exams for proper oral hygiene  Community Resource Referral / Chronic Care Management: CRR required this visit?  No   CCM required this visit?  No      Plan:     I have personally reviewed and noted the following in the patients chart:   Medical and social history Use of alcohol, tobacco or illicit drugs  Current medications and supplements including opioid prescriptions.  Functional ability and status Nutritional status Physical activity Advanced directives List of other physicians Hospitalizations, surgeries, and ER visits in previous 12 months Vitals Screenings to include cognitive,  depression, and falls Referrals and appointments  In addition, I have reviewed and discussed with patient certain preventive protocols, quality metrics, and best practice recommendations. A written personalized care plan for preventive services as well as general preventive health recommendations were provided to patient.    Ms. Shappell , Thank you for taking time to come for your Medicare Wellness Visit. I appreciate your ongoing commitment to your health goals. Please review the following plan we discussed and let me know if I can assist you in the future.   These are the goals we discussed:  Goals   None     This is a list of the screening recommended for you and due dates:  Health Maintenance  Topic Date Due   COVID-19 Vaccine (1) Never done   Tetanus Vaccine  Never done   Colon Cancer Screening  Never done   Mammogram  Never done   Zoster (Shingles) Vaccine (1 of 2) Never done   Pneumonia Vaccine (1 - PCV) Never done   DEXA scan (bone density measurement)  Never done   Flu Shot  Never done   Hepatitis C Screening: USPSTF Recommendation to screen - Ages 63-79 yo.  Completed   HPV Vaccine  Aged Out    Ms. Clydene Laming , Thank you for taking time to come for your Medicare Wellness Visit. I appreciate your ongoing commitment to your health goals. Please review the following plan we discussed and let me know if I can assist you in the future.   These are the goals we discussed:  Goals  None     This is a list of the screening recommended for you and due dates:  Health Maintenance  Topic Date Due   COVID-19 Vaccine (1) Never done   Tetanus Vaccine  Never done   Colon Cancer Screening  Never done   Mammogram  Never done   Zoster (Shingles) Vaccine (1 of 2) Never done   Pneumonia Vaccine (1 - PCV) Never done   DEXA scan (bone density measurement)  Never done   Flu Shot  Never done   Hepatitis C Screening: USPSTF Recommendation to screen - Ages 57-79 yo.  Completed   HPV Vaccine   Aged [address] [place], [place]   11/26/2021

## 2021-11-26 NOTE — Progress Notes (Signed)
I have reviewed this visit and agree with the documentation.   

## 2021-11-30 ENCOUNTER — Ambulatory Visit (INDEPENDENT_AMBULATORY_CARE_PROVIDER_SITE_OTHER): Payer: Medicare Other

## 2021-11-30 ENCOUNTER — Ambulatory Visit: Payer: Medicare Other | Admitting: Cardiology

## 2021-11-30 ENCOUNTER — Other Ambulatory Visit: Payer: Self-pay

## 2021-11-30 ENCOUNTER — Encounter: Payer: Self-pay | Admitting: Cardiology

## 2021-11-30 VITALS — BP 130/68 | HR 67 | Ht 62.0 in | Wt 155.0 lb

## 2021-11-30 DIAGNOSIS — I639 Cerebral infarction, unspecified: Secondary | ICD-10-CM | POA: Diagnosis not present

## 2021-11-30 DIAGNOSIS — I10 Essential (primary) hypertension: Secondary | ICD-10-CM

## 2021-11-30 DIAGNOSIS — E7800 Pure hypercholesterolemia, unspecified: Secondary | ICD-10-CM

## 2021-11-30 NOTE — Progress Notes (Signed)
Cardiology Office Note:    Date:  11/30/2021   ID:  Andrea Williamson, DOB 1951/09/09, MRN [id number]  PCP:  Pcp, No   [facility] Providers Cardiologist:  None     Referring MD: Bary Leriche, PA-C   Chief Complaint  Patient presents with   New Patient (Initial Visit)    Hospital follow up -- Meds reviewed verbally with patient.     History of Present Illness:    Andrea Williamson is a 71 y.o. 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  Patient admitted 2 to 3 weeks ago due to dizziness, nausea and vomiting.  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  Evaluated at [facility] for further therapy by hematology.  Rituximab plan steroids were administered. She had a stroke in November 2022, started on aspirin and Brilinta.  Subsequently diagnosed with bilateral BK DVT, Eliquis was started.  Being followed by hematology and neurology.  Echocardiogram 09/2021 EF 65-70, impaired relaxation.  Moderate LA dilatation.  She stopped smoking after her CVA incident.  Has no other complaints at this time.  Denies chest pain or shortness of breath.   Past Medical History:  Diagnosis Date   Deep vein thrombosis (DVT) ([place])    Hypertension    Stroke [facility])     Past Surgical History:  Procedure Laterality Date   ANKLE SURGERY Left 2008   IR CT HEAD LTD  10/05/2021   IR INTRA CRAN STENT  10/01/2021   IR PERCUTANEOUS ART THROMBECTOMY/INFUSION INTRACRANIAL INC DIAG ANGIO  10/01/2021   IR US GUIDE VASC ACCESS RIGHT  10/01/2021   RADIOLOGY WITH ANESTHESIA N/A 10/01/2021   Procedure: IR WITH ANESTHESIA;  Surgeon: Radiologist, Medication, MD;  Location: [place];  Service: Radiology;  Laterality: N/A;    Current Medications: Current Meds  Medication Sig   acetaminophen (TYLENOL) 325 MG tablet Take 1-2 tablets (325-650 mg total) by mouth every 4 (four) hours as needed for mild  pain.   ALPRAZolam (XANAX) 0.25 MG tablet Take 1 tablet (0.25 mg total) by mouth at bedtime as needed for anxiety. Take 1/2 tablet at bedtime for difficulty sleeping.   amLODipine (NORVASC) 5 MG tablet Take 1 tablet (5 mg total) by mouth daily.   apixaban (ELIQUIS) 5 MG TABS tablet Take 1 tablet (5 mg total) by mouth 2 (two) times daily.   atorvastatin (LIPITOR) 80 MG tablet Take 1 tablet (80 mg total) by mouth daily.   ferrous sulfate 325 (65 FE) MG tablet Take 325 mg by mouth daily with breakfast.   folic acid (FOLVITE) 1 MG tablet Take 1 tablet (1 mg total) by mouth daily.   hydrALAZINE (APRESOLINE) 25 MG tablet Take 1 tablet (25 mg total) by mouth every 8 (eight) hours as needed (SBP >170 or DBP >110).   magnesium gluconate (MAGONATE) 500 MG tablet Take 0.5 tablets (250 mg total) by mouth at bedtime.   ondansetron (ZOFRAN-ODT) 4 MG disintegrating tablet Take 1 tablet (4 mg total) by mouth once as needed for nausea.   pantoprazole (PROTONIX) 40 MG tablet Take 1 tablet (40 mg total) by mouth daily.   predniSONE (DELTASONE) 20 MG tablet Take 4 tablets (80 mg total) by mouth daily with breakfast.     Allergies:   Patient has no known allergies.   Social History   Socioeconomic History   Marital status: Married    Spouse name: Not on  file   Number of children: 3   Years of education: 12   Highest education level: 12th grade  Occupational History   Occupation: Retired  Tobacco Use   Smoking status: Former    Packs/day: 1.50    Years: 50.00    Pack years: 75.00    Types: Cigarettes    Quit date: 09/2021    Years since quitting: 0.2   Smokeless tobacco: Never  Vaping Use   Vaping Use: Never used  Substance and Sexual Activity   Alcohol use: Not Currently   Drug use: Not Currently   Sexual activity: Not Currently  Other Topics Concern   Not on file  Social History Narrative   October 2022 stopped   Social Determinants of Health   Financial Resource Strain: Low Risk     Difficulty of Paying Living Expenses: Not hard at all  Food Insecurity: No Food Insecurity   Worried About Charity fundraiser in the Last Year: Never true   Arboriculturist in the Last Year: Never true  Transportation Needs: No Transportation Needs   Lack of Transportation (Medical): No   Lack of Transportation (Non-Medical): No  Physical Activity: Insufficiently Active   Days of Exercise per Week: 2 days   Minutes of Exercise per Session: 60 min  Stress: Stress Concern Present   Feeling of Stress : To some extent  Social Connections: Moderately Isolated   Frequency of Communication with Friends and Family: More than three times a week   Frequency of Social Gatherings with Friends and Family: More than three times a week   Attends Religious Services: Never   Marine scientist or Organizations: No   Attends Archivist Meetings: Never   Marital Status: Married     Family History: The patient's family history includes COPD in her mother; Cancer in her mother.  ROS:   Please see the history of present illness.     All other systems reviewed and are negative.  EKGs/Labs/Other Studies Reviewed:    The following studies were reviewed today:   EKG:  EKG is  ordered today.  The ekg ordered today demonstrates   Recent Labs: 11/14/2021: Magnesium 2.3 11/25/2021: ALT 55; BUN 44; Creatinine, Ser 1.13; Hemoglobin 8.4; Platelets 301; Potassium 4.3; Sodium 134  Recent Lipid Panel    Component Value Date/Time   CHOL 317 (H) 10/02/2021 0401   TRIG 182 (H) 10/02/2021 0401   HDL 49 10/02/2021 0401   CHOLHDL 6.5 10/02/2021 0401   VLDL 36 10/02/2021 0401   LDLCALC 232 (H) 10/02/2021 0401     Risk Assessment/Calculations:          Physical Exam:    VS:  BP 130/68 (BP Location: Left Arm, Patient Position: Sitting, Cuff Size: Normal)    Pulse 67    Ht 5\' 2"  (1.575 m)    Wt 155 lb (70.3 kg)    SpO2 99%    BMI 28.35 kg/m     Wt Readings from Last 3 Encounters:   11/30/21 155 lb (70.3 kg)  11/25/21 152 lb 8 oz (69.2 kg)  11/23/21 155 lb 1.6 oz (70.4 kg)     GEN:  Well nourished, well developed in no acute distress HEENT: Normal NECK: No JVD; No carotid bruits LYMPHATICS: No lymphadenopathy CARDIAC: RRR, no murmurs, rubs, gallops RESPIRATORY:  Clear to auscultation without rales, wheezing or rhonchi  ABDOMEN: Soft, non-tender, non-distended MUSCULOSKELETAL:  No edema; No deformity  SKIN: Warm and dry  NEUROLOGIC:  Alert and oriented x 3 PSYCHIATRIC:  Normal affect   ASSESSMENT:    1. Cerebrovascular accident (CVA), unspecified mechanism ([place])   2. Primary hypertension   3. Pure hypercholesterolemia    PLAN:    In order of problems listed above:  CVA, place cardiac monitor to evaluate presence of A. fib or flutter.  Continue Eliquis and Lipitor.  Also on Eliquis due to bilateral BK DVT. Hypertension, BP controlled.  Continue amlodipine. Hyperlipidemia, agree with high intensity statin Lipitor 80 mg daily.  Follow-up after cardiac monitor.       Medication Adjustments/Labs and Tests Ordered: Current medicines are reviewed at length with the patient today.  Concerns regarding medicines are outlined above.  Orders Placed This Encounter  Procedures   LONG TERM MONITOR (3-14 DAYS)   EKG 12-Lead   No orders of the defined types were placed in this encounter.   Patient Instructions  Medication Instructions:  Your physician recommends that you continue on your current medications as directed. Please refer to the Current Medication list given to you today.  *If you need a refill on your cardiac medications before your next appointment, please call your pharmacy*   Lab Work: None ordered If you have labs (blood work) drawn today and your tests are completely normal, you will receive your results only by: [place] (if you have MyChart) OR A paper copy in the mail If you have any lab test that is abnormal or we need to  change your treatment, we will call you to review the results.   Testing/Procedures:   Your physician has recommended that you wear a Zio XT monitor for 2 weeks.  This will be mailed to your home in 4-5 business days.   This monitor is a medical device that records the hearts electrical activity. Doctors most often use these monitors to diagnose arrhythmias. Arrhythmias are problems with the speed or rhythm of the heartbeat. The monitor is a small device applied to your chest. You can wear one while you do your normal daily activities. While wearing this monitor if you have any symptoms to push the button and record what you felt. Once you have worn this monitor for the period of time provider prescribed (Usually 14 days), you will return the monitor device in the postage paid box. Once it is returned they will download the data collected and provide [place] with a report which the provider will then review and we will call you with those results. Important tips:  Avoid showering during the first 24 hours of wearing the monitor. Avoid excessive sweating to help maximize wear time. Do not submerge the device, no hot tubs, and no swimming pools. Keep any lotions or oils away from the patch. After 24 hours you may shower with the patch on. Take brief showers with your back facing the shower head.  Do not remove patch once it has been placed because that will interrupt data and decrease adhesive wear time. Push the button when you have any symptoms and write down what you were feeling. Once you have completed wearing your monitor, remove and place into box which has postage paid and place in your outgoing mailbox.  If for some reason you have misplaced your box then call our office and we can provide another box and/or mail it off for you.      Follow-Up: At [facility], you and your health needs are our priority.  As part of our continuing mission to  provide you with exceptional heart care, we  have created designated Provider Care Teams.  These Care Teams include your primary Cardiologist (physician) and Advanced Practice Providers (APPs -  Physician Assistants and Nurse Practitioners) who all work together to provide you with the care you need, when you need it.  We recommend signing up for the patient portal called "MyChart".  Sign up information is provided on this After Visit Summary.  MyChart is used to connect with patients for Virtual Visits (Telemedicine).  Patients are able to view lab/test results, encounter notes, upcoming appointments, etc.  Non-urgent messages can be sent to your provider as well.   To learn more about what you can do with MyChart, go to [email or web address].    Your next appointment:   6 week(s)  The format for your next appointment:   In Person  Provider:   You may see Kate Sable, MD or one of the following Advanced Practice Providers on your designated Care Team:   Murray Hodgkins, NP Christell Faith, PA-C Cadence Kathlen Mody, [place]    Other Instructions     Signed, Kate Sable, MD  11/30/2021 12:39 PM    [place]

## 2021-11-30 NOTE — Patient Instructions (Signed)
Medication Instructions:  Your physician recommends that you continue on your current medications as directed. Please refer to the Current Medication list given to you today.  *If you need a refill on your cardiac medications before your next appointment, please call your pharmacy*   Lab Work: None ordered If you have labs (blood work) drawn today and your tests are completely normal, you will receive your results only by: [place] (if you have MyChart) OR A paper copy in the mail If you have any lab test that is abnormal or we need to change your treatment, we will call you to review the results.   Testing/Procedures:   Your physician has recommended that you wear a Zio XT monitor for 2 weeks.  This will be mailed to your home in 4-5 business days.   This monitor is a medical device that records the hearts electrical activity. Doctors most often use these monitors to diagnose arrhythmias. Arrhythmias are problems with the speed or rhythm of the heartbeat. The monitor is a small device applied to your chest. You can wear one while you do your normal daily activities. While wearing this monitor if you have any symptoms to push the button and record what you felt. Once you have worn this monitor for the period of time provider prescribed (Usually 14 days), you will return the monitor device in the postage paid box. Once it is returned they will download the data collected and provide [place] with a report which the provider will then review and we will call you with those results. Important tips:  Avoid showering during the first 24 hours of wearing the monitor. Avoid excessive sweating to help maximize wear time. Do not submerge the device, no hot tubs, and no swimming pools. Keep any lotions or oils away from the patch. After 24 hours you may shower with the patch on. Take brief showers with your back facing the shower head.  Do not remove patch once it has been placed because that will  interrupt data and decrease adhesive wear time. Push the button when you have any symptoms and write down what you were feeling. Once you have completed wearing your monitor, remove and place into box which has postage paid and place in your outgoing mailbox.  If for some reason you have misplaced your box then call our office and we can provide another box and/or mail it off for you.      Follow-Up: At [facility], you and your health needs are our priority.  As part of our continuing mission to provide you with exceptional heart care, we have created designated Provider Care Teams.  These Care Teams include your primary Cardiologist (physician) and Advanced Practice Providers (APPs -  Physician Assistants and Nurse Practitioners) who all work together to provide you with the care you need, when you need it.  We recommend signing up for the patient portal called "MyChart".  Sign up information is provided on this After Visit Summary.  MyChart is used to connect with patients for Virtual Visits (Telemedicine).  Patients are able to view lab/test results, encounter notes, upcoming appointments, etc.  Non-urgent messages can be sent to your provider as well.   To learn more about what you can do with MyChart, go to [email or web address].    Your next appointment:   6 week(s)  The format for your next appointment:   In Person  Provider:   You may see Kate Sable, MD or one of the following  Advanced Practice Providers on your designated Care Team:   Murray Hodgkins, NP Christell Faith, PA-C Cadence Kathlen Mody, [place]    Other Instructions

## 2021-12-01 ENCOUNTER — Ambulatory Visit: Payer: Medicare Other

## 2021-12-01 DIAGNOSIS — M6281 Muscle weakness (generalized): Secondary | ICD-10-CM

## 2021-12-01 DIAGNOSIS — R278 Other lack of coordination: Secondary | ICD-10-CM | POA: Diagnosis not present

## 2021-12-01 DIAGNOSIS — R2681 Unsteadiness on feet: Secondary | ICD-10-CM

## 2021-12-01 DIAGNOSIS — R2689 Other abnormalities of gait and mobility: Secondary | ICD-10-CM

## 2021-12-01 NOTE — Therapy (Signed)
[place] MAIN [facility] SERVICES [address] [place], [place], [address] Phone: [phone]   Fax:  [phone]  Physical Therapy Treatment/Physical Therapy Progress Note VISIT 10  Dates of reporting period  10/21/2021   to   12/01/2021   Patient Details  Name: Andrea Williamson MRN: [id number] Date of Birth: [birth year] Referring Provider (PT): Reesa Chew, [place]   Encounter Date: 12/01/2021   PT End of Session - 12/01/21 0938     Visit Number 10    Number of Visits 25    Date for PT Re-Evaluation 01/13/22    Authorization Time Period Eval performed: 10/21/21    Progress Note Due on Visit 10    PT Start Time 0848    PT Stop Time 0929    PT Time Calculation (min) 41 min    Equipment Utilized During Treatment Gait belt    Activity Tolerance Patient tolerated treatment well    Behavior During Therapy WFL for tasks assessed/performed             Past Medical History:  Diagnosis Date   Deep vein thrombosis (DVT) ([place])    Hypertension    Stroke [facility])     Past Surgical History:  Procedure Laterality Date   ANKLE SURGERY Left 2008   IR CT HEAD LTD  10/05/2021   IR INTRA CRAN STENT  10/01/2021   IR PERCUTANEOUS ART THROMBECTOMY/INFUSION INTRACRANIAL INC DIAG ANGIO  10/01/2021   IR US GUIDE VASC ACCESS RIGHT  10/01/2021   RADIOLOGY WITH ANESTHESIA N/A 10/01/2021   Procedure: IR WITH ANESTHESIA;  Surgeon: Radiologist, Medication, MD;  Location: [facility];  Service: Radiology;  Laterality: N/A;    There were no vitals filed for this visit.     Subjective Assessment - 12/01/21 0935     Subjective Patient reports she feels fine today. The patient reports she has not had any stumbles or loss of balance since previous session and no dizziness.  She reports she is not using her rolling walker except for when she ambulates outside.  The patient has been walking 2000-3000 steps per day.    Patient is accompained by: Family member    Pertinent  History Pt is a 71 y/o female presenting with R LE strength and balance deficits. Pt is s/p L hemisphere CVA, admitted to the ED 10/01/21. Imaging included MRI scans on 10/02/21 yielding "Redemonstrated infarcts in the left ACA and ACA/PCA watershed  territory, with one new area of restricted diffusion in the medial right parietal lobe, likely an additional acute infarct, status post  A2 stenting. A small amount of associated petechial hemorrhage is  noted within the medial left frontal lobe, without significant  hemorrhagic transformation." per Dr. Merilyn Baba, MD. Pt has PMH that includes HLD.    Limitations Walking;Standing    How long can you sit comfortably? "a long time"    How long can you stand comfortably? 30 minutes to an hour    How long can you walk comfortably? 7 minutes    Diagnostic tests CT (10/01/21) - 1. The right common femoral artery has normal caliber, adequate for   vascular access.   2. 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   3. Intracranial atherosclerotic disease with multifocal areas of   mild stenosis in the left ACA and MCA vascular trees. MRI HEAD WITHOUT CONTRAST (10/12/21) - "Redemonstrated infarcts in the left ACA and ACA/PCA watershed  territory, with one new area of restricted diffusion  in the medial right parietal lobe, likely an additional acute infarct, status post  A2 stenting. 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 No/denies             INTERVENTIONS-goals reassessed for progress note  5xSTS 13 sec (goal achieved)  10MWT - 1.0 m/s no AD (partially met)  6MWT - 1065 ft no AD (goal achieved)  FOTO: 68% (partially met)  Merrilee Jansky: 54/56 (goal achieved)  One foot on foam and one on 12 inch step, EC - 2x30 sec each LE;  patient exhibits some sway. CGA provided  Standing R hip march 2x10 with 3 sec holds at max range of motion achieved. Cuing for eccentric control  Education on progressing home walking program. PT recommends pt increase walking duration by 5 minutes this week.      Pt educated throughout session about proper posture and technique with exercises. Improved exercise technique, movement at target joints, use of target muscles after min to mod verbal, visual, tactile cues.   Note: Portions of this document were prepared using Dragon voice recognition software and although reviewed may contain unintentional dictation errors in syntax, grammar, or spelling.    PT Short Term Goals - 12/01/21 0939       PT SHORT TERM GOAL #1   Title Pt will be independent with HEP in order to improve strength and balance in order to decrease fall risk and improve function at home and work.    Baseline 10/21/21: HEP deferred to next visit; 1/17: Pt performing progressive walking program, pt to increase time ambulated by 5 minutes    Time 6    Period Weeks    Status On-going    Target Date 01/12/22               PT Long Term Goals - 12/01/21 0939       PT LONG TERM GOAL #1   Title Pt will improve FOTO score to >71% to show improved functional mobility with ADLs    Baseline 10/21/21: 58%; 12/01/2021: 68%    Time 12    Period Weeks    Status Partially Met    Target Date 01/13/22      PT LONG TERM GOAL #2   Title Pt will improve BERG by at least 3 points in order to demonstrate clinically significant improvement in balance.    Baseline 10/21/21: 44/56; 1/17: 54/56    Time 12    Period Weeks    Status Achieved    Target Date 01/13/22      PT LONG TERM GOAL #3   Title Pt will decrease 5TSTS by at least 3 seconds in order to demonstrate clinically significant improvement in LE strength.    Baseline 10/21/21: 16.3 sec without UE support; 1/17: 13 sec hands-free    Time 12    Period Weeks    Status  Achieved    Target Date 01/13/22      PT LONG TERM GOAL #4   Title Pt will improve 10MWT gait speed to >1.0 m/s with least restrictive AD to show clinically significant improvement and decrease risk of falls.    Baseline 10/21/21: 0.82 m/s with 2WW and CGA; 1/17 1 m/s without AD  Time 12    Period Weeks    Status Partially Met    Target Date 01/13/22      PT LONG TERM GOAL #5   Title Pt will increase 6MWT by at least 30m(1647f in order to demonstrate clinically significant improvement in cardiopulmonary endurance and community ambulation    Baseline 10/21/21: deferred to next visit; 10/27/2021= 790 feet with front wheeled walker; 1/17: 1065 ft no AD    Time 12    Period Weeks    Status Achieved    Target Date 01/13/22      Additional Long Term Goals   Additional Long Term Goals Yes      PT LONG TERM GOAL #6   Title Pt will report ability to ambulate without her RW outside for at least 30 minutes and without steadiness in order to return patient to PLOF    Baseline 1/17: new    Time 12    Period Weeks    Status New    Target Date 02/23/22      PT LONG TERM GOAL #7   Title Pt will increase 6MWT by at least 200 ft in order to demonstrate clinically significant improvement in cardiopulmonary endurance and community ambulation    Baseline 1/17: 1065 ft (achieved previous 6MWT goal, can now be progressed further toward PLOF)    Time 12    Period Weeks    Status New    Target Date 02/23/22                   Plan - 12/01/21 0947     Clinical Impression Statement Goals reassessed for progress note.  Patient is making gains in therapy evidenced by patient achieving majority of long-term therapy goals and partially achieving remaining LTGs.  This indicates improvements in lower extremity strength and power, gait ability and functional capacity, and balance.  These improvements also indicate patient has decreased her fall risk.  PT has added new goals to progress patient  toward her prior level of function. Patient's condition has the potential to improve in response to therapy. Maximum improvement is yet to be obtained. The anticipated improvement is attainable and reasonable in a generally predictable time.  The patient will benefit from further skilled physical therapy to continue to improve gait, endurance, strength and balance to return patient to PLOF.    Personal Factors and Comorbidities Age;Comorbidity 1;Sex;Time since onset of injury/illness/exacerbation;Fitness;Social Background    Comorbidities HLD    Examination-Activity Limitations Bathing;Bed Mobility;Caring for Others;Reach Overhead;Hygiene/Grooming;Self Feeding;Squat;Stairs;Transfers;Toileting;Sleep;Locomotion Level;Lift;Stand    Examination-Participation Restrictions Cleaning;Community Activity;Shop;Laundry;Meal Prep;Volunteer;Driving;Interpersonal Relationship    Stability/Clinical Decision Making Stable/Uncomplicated    Rehab Potential Good    PT Frequency 2x / week    PT Duration 12 weeks    PT Treatment/Interventions ADLs/Self Care Home Management;Cryotherapy;Electrical Stimulation;Moist Heat;Ultrasound;Gait training;Stair training;DME Instruction;Functional mobility training;Therapeutic activities;Therapeutic exercise;Balance training;Neuromuscular re-education;Patient/family education;Manual techniques;Energy conservation;Passive range of motion;Joint Manipulations;Splinting;Taping;Vestibular;Visual/perceptual remediation/compensation;Wheelchair mobility training;Canalith Repostioning    PT Next Visit Plan Progress dynamic balance, strengthening as pt is able, continue to monitor BP    PT Home Exercise Plan 10/27/2021=Access Code: [email or web address]: Education on progressing home walking program. PT recommends pt increase walking duration by 5 minutes this week.    Consulted and Agree with Plan of Care Patient             Patient will benefit from skilled therapeutic intervention in order to  improve the following deficits and impairments:  Abnormal gait, Decreased activity tolerance, Decreased endurance, Decreased knowledge  of use of DME, Decreased strength, Impaired sensation, Hypomobility, Decreased range of motion, Impaired UE functional use, Improper body mechanics, Pain, Decreased balance, Decreased coordination, Decreased mobility, Decreased safety awareness, Difficulty walking, Impaired flexibility, Postural dysfunction  Visit Diagnosis: Muscle weakness (generalized)  Unsteadiness on feet  Other abnormalities of gait and mobility     Problem List Patient Active Problem List   Diagnosis Date Noted   History of stroke 11/13/2021   Essential hypertension 11/13/2021   DVT (deep venous thrombosis) ([place]) 11/13/2021   Adnexal mass 11/13/2021   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 [place], [place], [address] Phone: [phone]   Fax:  [phone]  Name: AMRA SHUKLA MRN: [id number] Date of Birth: 02/14/1951

## 2021-12-02 ENCOUNTER — Inpatient Hospital Stay: Payer: Medicare Other

## 2021-12-02 ENCOUNTER — Other Ambulatory Visit: Payer: Self-pay | Admitting: *Deleted

## 2021-12-02 ENCOUNTER — Telehealth: Payer: Self-pay | Admitting: *Deleted

## 2021-12-02 ENCOUNTER — Other Ambulatory Visit: Payer: Self-pay

## 2021-12-02 VITALS — BP 152/76 | HR 62 | Temp 96.3°F

## 2021-12-02 DIAGNOSIS — I6782 Cerebral ischemia: Secondary | ICD-10-CM | POA: Diagnosis not present

## 2021-12-02 DIAGNOSIS — Z836 Family history of other diseases of the respiratory system: Secondary | ICD-10-CM | POA: Diagnosis not present

## 2021-12-02 DIAGNOSIS — Z87891 Personal history of nicotine dependence: Secondary | ICD-10-CM | POA: Diagnosis not present

## 2021-12-02 DIAGNOSIS — Z7964 Long term (current) use of myelosuppressive agent: Secondary | ICD-10-CM | POA: Diagnosis not present

## 2021-12-02 DIAGNOSIS — Z79899 Other long term (current) drug therapy: Secondary | ICD-10-CM | POA: Diagnosis not present

## 2021-12-02 DIAGNOSIS — I700 Atherosclerosis of aorta: Secondary | ICD-10-CM | POA: Diagnosis not present

## 2021-12-02 DIAGNOSIS — M47817 Spondylosis without myelopathy or radiculopathy, lumbosacral region: Secondary | ICD-10-CM | POA: Diagnosis not present

## 2021-12-02 DIAGNOSIS — I10 Essential (primary) hypertension: Secondary | ICD-10-CM | POA: Diagnosis not present

## 2021-12-02 DIAGNOSIS — D5919 Other autoimmune hemolytic anemia: Secondary | ICD-10-CM

## 2021-12-02 DIAGNOSIS — I82441 Acute embolism and thrombosis of right tibial vein: Secondary | ICD-10-CM | POA: Diagnosis not present

## 2021-12-02 DIAGNOSIS — I82452 Acute embolism and thrombosis of left peroneal vein: Secondary | ICD-10-CM | POA: Diagnosis not present

## 2021-12-02 DIAGNOSIS — Z86718 Personal history of other venous thrombosis and embolism: Secondary | ICD-10-CM | POA: Diagnosis not present

## 2021-12-02 DIAGNOSIS — Z7901 Long term (current) use of anticoagulants: Secondary | ICD-10-CM | POA: Diagnosis not present

## 2021-12-02 DIAGNOSIS — R531 Weakness: Secondary | ICD-10-CM | POA: Diagnosis not present

## 2021-12-02 DIAGNOSIS — R5383 Other fatigue: Secondary | ICD-10-CM | POA: Diagnosis not present

## 2021-12-02 DIAGNOSIS — Z5112 Encounter for antineoplastic immunotherapy: Secondary | ICD-10-CM | POA: Diagnosis not present

## 2021-12-02 DIAGNOSIS — D5911 Warm autoimmune hemolytic anemia: Secondary | ICD-10-CM | POA: Diagnosis not present

## 2021-12-02 DIAGNOSIS — K429 Umbilical hernia without obstruction or gangrene: Secondary | ICD-10-CM | POA: Diagnosis not present

## 2021-12-02 DIAGNOSIS — G9389 Other specified disorders of brain: Secondary | ICD-10-CM | POA: Diagnosis not present

## 2021-12-02 DIAGNOSIS — E785 Hyperlipidemia, unspecified: Secondary | ICD-10-CM | POA: Diagnosis not present

## 2021-12-02 DIAGNOSIS — Z8673 Personal history of transient ischemic attack (TIA), and cerebral infarction without residual deficits: Secondary | ICD-10-CM | POA: Diagnosis not present

## 2021-12-02 DIAGNOSIS — Z809 Family history of malignant neoplasm, unspecified: Secondary | ICD-10-CM | POA: Diagnosis not present

## 2021-12-02 LAB — LACTATE DEHYDROGENASE: LDH: 272 U/L — ABNORMAL HIGH (ref 98–192)

## 2021-12-02 LAB — COMPREHENSIVE METABOLIC PANEL
ALT: 41 U/L (ref 0–44)
AST: 20 U/L (ref 15–41)
Albumin: 3.9 g/dL (ref 3.5–5.0)
Alkaline Phosphatase: 58 U/L (ref 38–126)
Anion gap: 10 (ref 5–15)
BUN: 38 mg/dL — ABNORMAL HIGH (ref 8–23)
CO2: 24 mmol/L (ref 22–32)
Calcium: 9.6 mg/dL (ref 8.9–10.3)
Chloride: 103 mmol/L (ref 98–111)
Creatinine, Ser: 1.24 mg/dL — ABNORMAL HIGH (ref 0.44–1.00)
GFR, Estimated: 47 mL/min — ABNORMAL LOW (ref >60)
Glucose, Bld: 83 mg/dL (ref 70–99)
Potassium: 3.8 mmol/L (ref 3.5–5.1)
Sodium: 137 mmol/L (ref 135–145)
Total Bilirubin: 1.2 mg/dL (ref 0.3–1.2)
Total Protein: 6.4 g/dL — ABNORMAL LOW (ref 6.5–8.1)

## 2021-12-02 LAB — CBC WITH DIFFERENTIAL/PLATELET
Abs Immature Granulocytes: 0.14 10*3/uL — ABNORMAL HIGH (ref 0.00–0.07)
Basophils Absolute: 0 10*3/uL (ref 0.0–0.1)
Basophils Relative: 0 %
Eosinophils Absolute: 0.1 10*3/uL (ref 0.0–0.5)
Eosinophils Relative: 1 %
HCT: 28.6 % — ABNORMAL LOW (ref 36.0–46.0)
Hemoglobin: 9.5 g/dL — ABNORMAL LOW (ref 12.0–15.0)
Immature Granulocytes: 1 %
Lymphocytes Relative: 29 %
Lymphs Abs: 2.8 10*3/uL (ref 0.7–4.0)
MCH: 41.5 pg — ABNORMAL HIGH (ref 26.0–34.0)
MCHC: 33.2 g/dL (ref 30.0–36.0)
MCV: 124.9 fL — ABNORMAL HIGH (ref 80.0–100.0)
Monocytes Absolute: 0.7 10*3/uL (ref 0.1–1.0)
Monocytes Relative: 7 %
Neutro Abs: 6 10*3/uL (ref 1.7–7.7)
Neutrophils Relative %: 62 %
Platelets: 282 10*3/uL (ref 150–400)
RBC: 2.29 MIL/uL — ABNORMAL LOW (ref 3.87–5.11)
RDW: 14.7 % (ref 11.5–15.5)
WBC: 9.7 10*3/uL (ref 4.0–10.5)
nRBC: 0 % (ref 0.0–0.2)

## 2021-12-02 LAB — RETIC PANEL
Immature Retic Fract: 25 % — ABNORMAL HIGH (ref 2.3–15.9)
RBC.: 2.31 MIL/uL — ABNORMAL LOW (ref 3.87–5.11)
Retic Count, Absolute: 226.8 10*3/uL — ABNORMAL HIGH (ref 19.0–186.0)
Retic Ct Pct: 9.8 % — ABNORMAL HIGH (ref 0.4–3.1)
Reticulocyte Hemoglobin: 39.1 pg (ref >27.9)

## 2021-12-02 MED ORDER — PREDNISONE 10 MG PO TABS: 10.0000 mg | ORAL_TABLET | Freq: Every day | ORAL | 0 refills | Status: DC

## 2021-12-02 MED ORDER — SODIUM CHLORIDE 0.9 % IV SOLN
375.0000 mg/m2 | Freq: Once | INTRAVENOUS | Status: AC
  Administered 2021-12-02: 700 mg via INTRAVENOUS
  Filled 2021-12-02: qty 50

## 2021-12-02 MED ORDER — ACETAMINOPHEN 325 MG PO TABS
650.0000 mg | ORAL_TABLET | Freq: Once | ORAL | Status: AC
  Administered 2021-12-02: 650 mg via ORAL
  Filled 2021-12-02: qty 2

## 2021-12-02 MED ORDER — DIPHENHYDRAMINE HCL 25 MG PO CAPS
50.0000 mg | ORAL_CAPSULE | Freq: Once | ORAL | Status: AC
  Administered 2021-12-02: 50 mg via ORAL
  Filled 2021-12-02: qty 2

## 2021-12-02 MED ORDER — SODIUM CHLORIDE 0.9 % IV SOLN
Freq: Once | INTRAVENOUS | Status: AC
  Filled 2021-12-02: qty 250

## 2021-12-02 MED ORDER — SODIUM CHLORIDE 0.9 % IV SOLN
INTRAVENOUS | Status: AC
  Filled 2021-12-02 (×2): qty 250

## 2021-12-02 NOTE — Patient Instructions (Signed)
[facility]  Discharge Instructions: ?Thank you for choosing [facility] to provide your oncology and hematology care.  ?If you have a lab appointment with the Cancer Center, please go directly to the Cancer Center and check in at the registration area. ? ?Wear comfortable clothing and clothing appropriate for easy access to any Portacath or PICC line.  ? ?We strive to give you quality time with your provider. You may need to reschedule your appointment if you arrive late (15 or more minutes).  Arriving late affects you and other patients whose appointments are after yours.  Also, if you miss three or more appointments without notifying the office, you may be dismissed from the clinic at the provider?s discretion.    ?  ?For prescription refill requests, have your pharmacy contact our office and allow 72 hours for refills to be completed.   ? ?Today you received the following chemotherapy and/or immunotherapy agents Rituxan  ?  ?To help prevent nausea and vomiting after your treatment, we encourage you to take your nausea medication as directed. ? ?BELOW ARE SYMPTOMS THAT SHOULD BE REPORTED IMMEDIATELY: ?*FEVER GREATER THAN 100.4 F (38 ?C) OR HIGHER ?*CHILLS OR SWEATING ?*NAUSEA AND VOMITING THAT IS NOT CONTROLLED WITH YOUR NAUSEA MEDICATION ?*UNUSUAL SHORTNESS OF BREATH ?*UNUSUAL BRUISING OR BLEEDING ?*URINARY PROBLEMS (pain or burning when urinating, or frequent urination) ?*BOWEL PROBLEMS (unusual diarrhea, constipation, pain near the anus) ?TENDERNESS IN MOUTH AND THROAT WITH OR WITHOUT PRESENCE OF ULCERS (sore throat, sores in mouth, or a toothache) ?UNUSUAL RASH, SWELLING OR PAIN  ?UNUSUAL VAGINAL DISCHARGE OR ITCHING  ? ?Items with * indicate a potential emergency and should be followed up as soon as possible or go to the Emergency Department if any problems should occur. ? ?Please show the CHEMOTHERAPY ALERT CARD or IMMUNOTHERAPY ALERT CARD at check-in to the  Emergency Department and triage nurse. ? ?Should you have questions after your visit or need to cancel or reschedule your appointment, please contact [facility]  [phone] and follow the prompts.  Office hours are 8:00 a.m. to 4:30 p.m. Monday - Friday. Please note that voicemails left after 4:00 p.m. may not be returned until the following business day.  We are closed weekends and major holidays. You have access to a nurse at all times for urgent questions. Please call the main number to the clinic [phone] and follow the prompts. ? ?For any non-urgent questions, you may also contact your provider using MyChart. We now offer e-Visits for anyone 18 and older to request care online for non-urgent symptoms. For details visit [email or web address]. ?  ?Also download the MyChart app! Go to the app store, search "MyChart", open the app, select [facility], and log in with your MyChart username and password. ? ?Due to Covid, a mask is required upon entering the hospital/clinic. If you do not have a mask, one will be given to you upon arrival. For doctor visits, patients may have 1 support person aged 18 or older with them. For treatment visits, patients cannot have anyone with them due to current Covid guidelines and our immunocompromised population.  ?

## 2021-12-02 NOTE — Telephone Encounter (Signed)
Dr Janese Banks wanted me to reach out to pt's daughter and let her know that rao wants her on prednisone and it is taper. All directions on rx. Starts at 70 mg which is 7 pills and take it for 4 days and then will decrease by 10 mg until she is done. She was thankful for the call

## 2021-12-03 ENCOUNTER — Ambulatory Visit: Payer: Medicare Other

## 2021-12-03 DIAGNOSIS — R278 Other lack of coordination: Secondary | ICD-10-CM

## 2021-12-03 DIAGNOSIS — M6281 Muscle weakness (generalized): Secondary | ICD-10-CM

## 2021-12-03 DIAGNOSIS — R2681 Unsteadiness on feet: Secondary | ICD-10-CM | POA: Diagnosis not present

## 2021-12-03 DIAGNOSIS — R2689 Other abnormalities of gait and mobility: Secondary | ICD-10-CM

## 2021-12-03 LAB — HAPTOGLOBIN: Haptoglobin: 17 mg/dL — ABNORMAL LOW (ref 37–355)

## 2021-12-03 NOTE — Therapy (Signed)
[place] MAIN [facility] SERVICES [address] [place], [place], [address] Phone: [phone]   Fax:  [phone]  Physical Therapy Treatment  Patient Details  Name: Andrea Williamson MRN: [id number] Date of Birth: [birth year] Referring Provider (PT): Reesa Chew, [place]   Encounter Date: 12/03/2021   PT End of Session - 12/03/21 0934     Visit Number 11    Number of Visits 25    Date for PT Re-Evaluation 01/13/22    Authorization Time Period Eval performed: 10/21/21    Progress Note Due on Visit 10    PT Start Time 0847    PT Stop Time 0923    PT Time Calculation (min) 36 min    Equipment Utilized During Treatment Gait belt    Activity Tolerance Patient tolerated treatment well    Behavior During Therapy [facility] for tasks assessed/performed             Past Medical History:  Diagnosis Date   Deep vein thrombosis (DVT) ([place])    Hypertension    Stroke [facility])     Past Surgical History:  Procedure Laterality Date   ANKLE SURGERY Left 2008   IR CT HEAD LTD  10/05/2021   IR INTRA CRAN STENT  10/01/2021   IR PERCUTANEOUS ART THROMBECTOMY/INFUSION INTRACRANIAL INC DIAG ANGIO  10/01/2021   IR US GUIDE VASC ACCESS RIGHT  10/01/2021   RADIOLOGY WITH ANESTHESIA N/A 10/01/2021   Procedure: IR WITH ANESTHESIA;  Surgeon: Radiologist, Medication, MD;  Location: [place];  Service: Radiology;  Laterality: N/A;    There were no vitals filed for this visit.   Subjective Assessment - 12/03/21 0848     Subjective Pt reports she walked 4500 steps yesterday. Pt states "I didn't sleep at all last night." Pt reports some lightheaded sensation.    Patient is accompained by: Family member    Pertinent History Pt is a 71 y/o female presenting with R LE strength and balance deficits. Pt is s/p L hemisphere CVA, admitted to the ED 10/01/21. Imaging included MRI scans on 10/02/21 yielding "Redemonstrated infarcts in the left ACA and ACA/PCA watershed  territory, with one  new area of restricted diffusion in the medial right parietal lobe, likely an additional acute infarct, status post  A2 stenting. A small amount of associated petechial hemorrhage is  noted within the medial left frontal lobe, without significant  hemorrhagic transformation." per Dr. Merilyn Baba, MD. Pt has PMH that includes HLD.    Limitations Walking;Standing    How long can you sit comfortably? "a long time"    How long can you stand comfortably? 30 minutes to an hour    How long can you walk comfortably? 7 minutes    Diagnostic tests CT (10/01/21) - 1. The right common femoral artery has normal caliber, adequate for   vascular access.   2. 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   3. Intracranial atherosclerotic disease with multifocal areas of   mild stenosis in the left ACA and MCA vascular trees. MRI HEAD WITHOUT CONTRAST (10/12/21) - "Redemonstrated infarcts in the left ACA and ACA/PCA watershed  territory, with one new area of restricted diffusion in the medial right parietal lobe, likely an additional acute infarct, status post  A2 stenting. A small amount of associated petechial hemorrhage is  noted within the medial left frontal lobe, without significant  hemorrhagic transformation."    Patient Stated Goals wants to be able to go back to  bowling in january, taking care of her dog and cat    Currently in Pain? No/denies              INTERVENTIONS  STS 1x15 hands-free (initial bracing on BLEs, but pt was able to correct with cuing). Pt progressed to performing STS with holding 5000 gr ball for 15x   Leg press: 25# 2x10. Pt rates as easy, however, demonstrates difficulty with eccentric control. PT assists to decrease speed of movement.   Standing heel raises 20x B  Single leg heel raise RLE 2x10; pt performs through decreased AROM.   Seated LAQ RLE focus 4# donned - pt performs 15x. Pt reports brief lightheaded sensation.  Rest interval taken and BP assessed:   Seated BP  LUE:  155/73 mmHg HR 76 bpm  Pt reports sensation resolved and continues performing the following: LLE LAQ 10# donned for 2x10; pt rates as easy, no symptoms  Seated march RLE, cuing to not hold onto chair in order to engage postural muscles - Pt performs 30x, rates easy. 5# donned on RLE, pt performs 15 additional reps.    Pt educated throughout session about proper posture and technique with exercises. Improved exercise technique, movement at target joints, use of target muscles after min to mod verbal, visual, tactile cues.     PT Education - 12/03/21 0934     Education Details exercise technique, body mechanics    Person(s) Educated Patient;Caregiver(s)    Methods Explanation;Demonstration;Verbal cues    Comprehension Verbalized understanding;Returned demonstration;Verbal cues required;Need further instruction              PT Short Term Goals - 12/01/21 0939       PT SHORT TERM GOAL #1   Title Pt will be independent with HEP in order to improve strength and balance in order to decrease fall risk and improve function at home and work.    Baseline 10/21/21: HEP deferred to next visit; 1/17: Pt performing progressive walking program, pt to increase time ambulated by 5 minutes    Time 6    Period Weeks    Status On-going    Target Date 01/12/22               PT Long Term Goals - 12/01/21 0939       PT LONG TERM GOAL #1   Title Pt will improve FOTO score to >71% to show improved functional mobility with ADLs    Baseline 10/21/21: 58%; 12/01/2021: 68%    Time 12    Period Weeks    Status Partially Met    Target Date 01/13/22      PT LONG TERM GOAL #2   Title Pt will improve BERG by at least 3 points in order to demonstrate clinically significant improvement in balance.    Baseline 10/21/21: 44/56; 1/17: 54/56    Time 12    Period Weeks    Status Achieved    Target Date 01/13/22      PT LONG TERM GOAL #3   Title Pt will decrease 5TSTS by at least 3 seconds in  order to demonstrate clinically significant improvement in LE strength.    Baseline 10/21/21: 16.3 sec without UE support; 1/17: 13 sec hands-free    Time 12    Period Weeks    Status Achieved    Target Date 01/13/22      PT LONG TERM GOAL #4   Title Pt will improve 10MWT gait speed to >1.0 m/s with  least restrictive AD to show clinically significant improvement and decrease risk of falls.    Baseline 10/21/21: 0.82 m/s with 2WW and CGA; 1/17 1 m/s without AD    Time 12    Period Weeks    Status Partially Met    Target Date 01/13/22      PT LONG TERM GOAL #5   Title Pt will increase 6MWT by at least 23m (1109ft) in order to demonstrate clinically significant improvement in cardiopulmonary endurance and community ambulation    Baseline 10/21/21: deferred to next visit; 10/27/2021= 790 feet with front wheeled walker; 1/17: 1065 ft no AD    Time 12    Period Weeks    Status Achieved    Target Date 01/13/22      Additional Long Term Goals   Additional Long Term Goals Yes      PT LONG TERM GOAL #6   Title Pt will report ability to ambulate without her RW outside for at least 30 minutes and without steadiness in order to return patient to PLOF    Baseline 1/17: new    Time 12    Period Weeks    Status New    Target Date 02/23/22      PT LONG TERM GOAL #7   Title Pt will increase 6MWT by at least 200 ft in order to demonstrate clinically significant improvement in cardiopulmonary endurance and community ambulation    Baseline 1/17: 1065 ft (achieved previous 6MWT goal, can now be progressed further toward PLOF)    Time 12    Period Weeks    Status New    Target Date 02/23/22                   Plan - 12/03/21 0945     Clinical Impression Statement PT session focused primarily on RLE strengthening. Pt was able to progress volume and resistance of exercises within session with taking recovery intervals as needed. Pt reported only one instance of brief ligthheaded sensation  following seated LAQ. BP was assessed and was 155/73 mmHg. Session ended early as pt agitated and pt reports she did not sleep following previous day's infusion appointment. The pt will benefit from further skilled PT to continue to improve strength, balance, endurance and gait to increase ease and safety with ADLs.    Personal Factors and Comorbidities Age;Comorbidity 1;Sex;Time since onset of injury/illness/exacerbation;Fitness;Social Background    Comorbidities HLD    Examination-Activity Limitations Bathing;Bed Mobility;Caring for Others;Reach Overhead;Hygiene/Grooming;Self Feeding;Squat;Stairs;Transfers;Toileting;Sleep;Locomotion Level;Lift;Stand    Examination-Participation Restrictions Cleaning;Community Activity;Shop;Laundry;Meal Prep;Volunteer;Driving;Interpersonal Relationship    Stability/Clinical Decision Making Stable/Uncomplicated    Rehab Potential Good    PT Frequency 2x / week    PT Duration 12 weeks    PT Treatment/Interventions ADLs/Self Care Home Management;Cryotherapy;Electrical Stimulation;Moist Heat;Ultrasound;Gait training;Stair training;DME Instruction;Functional mobility training;Therapeutic activities;Therapeutic exercise;Balance training;Neuromuscular re-education;Patient/family education;Manual techniques;Energy conservation;Passive range of motion;Joint Manipulations;Splinting;Taping;Vestibular;Visual/perceptual remediation/compensation;Wheelchair mobility training;Canalith Repostioning    PT Next Visit Plan Progress dynamic balance, strengthening as pt is able, continue to monitor BP, progress endurance    PT Home Exercise Plan 10/27/2021=Access Code: [email or web address]; 1/17: Education on progressing home walking program. PT recommends pt increase walking duration by 5 minutes this week.    Consulted and Agree with Plan of Care Patient             Patient will benefit from skilled therapeutic intervention in order to improve the following deficits and impairments:  Abnormal  gait, Decreased activity tolerance, Decreased endurance, Decreased knowledge of use of  DME, Decreased strength, Impaired sensation, Hypomobility, Decreased range of motion, Impaired UE functional use, Improper body mechanics, Pain, Decreased balance, Decreased coordination, Decreased mobility, Decreased safety awareness, Difficulty walking, Impaired flexibility, Postural dysfunction  Visit Diagnosis: Muscle weakness (generalized)  Other abnormalities of gait and mobility  Other lack of coordination     Problem List Patient Active Problem List   Diagnosis Date Noted   History of stroke 11/13/2021   Essential hypertension 11/13/2021   DVT (deep venous thrombosis) ([place]) 11/13/2021   Adnexal mass 11/13/2021   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

## 2021-12-04 DIAGNOSIS — I471 Supraventricular tachycardia: Secondary | ICD-10-CM | POA: Diagnosis not present

## 2021-12-04 DIAGNOSIS — I639 Cerebral infarction, unspecified: Secondary | ICD-10-CM | POA: Diagnosis not present

## 2021-12-09 ENCOUNTER — Other Ambulatory Visit: Payer: Self-pay | Admitting: *Deleted

## 2021-12-09 ENCOUNTER — Encounter: Payer: Self-pay | Admitting: Oncology

## 2021-12-09 ENCOUNTER — Inpatient Hospital Stay (HOSPITAL_BASED_OUTPATIENT_CLINIC_OR_DEPARTMENT_OTHER): Payer: Medicare Other | Admitting: Oncology

## 2021-12-09 ENCOUNTER — Other Ambulatory Visit: Payer: Self-pay

## 2021-12-09 ENCOUNTER — Inpatient Hospital Stay: Payer: Medicare Other

## 2021-12-09 ENCOUNTER — Encounter: Payer: Self-pay | Admitting: Internal Medicine

## 2021-12-09 VITALS — BP 145/70 | HR 68 | Temp 97.2°F | Resp 16 | Ht 62.0 in | Wt 156.0 lb

## 2021-12-09 VITALS — BP 138/68 | HR 68 | Temp 97.6°F

## 2021-12-09 DIAGNOSIS — G9389 Other specified disorders of brain: Secondary | ICD-10-CM | POA: Diagnosis not present

## 2021-12-09 DIAGNOSIS — I82403 Acute embolism and thrombosis of unspecified deep veins of lower extremity, bilateral: Secondary | ICD-10-CM | POA: Diagnosis not present

## 2021-12-09 DIAGNOSIS — I10 Essential (primary) hypertension: Secondary | ICD-10-CM | POA: Diagnosis not present

## 2021-12-09 DIAGNOSIS — Z836 Family history of other diseases of the respiratory system: Secondary | ICD-10-CM | POA: Diagnosis not present

## 2021-12-09 DIAGNOSIS — D5919 Other autoimmune hemolytic anemia: Secondary | ICD-10-CM

## 2021-12-09 DIAGNOSIS — M47817 Spondylosis without myelopathy or radiculopathy, lumbosacral region: Secondary | ICD-10-CM | POA: Diagnosis not present

## 2021-12-09 DIAGNOSIS — R5383 Other fatigue: Secondary | ICD-10-CM | POA: Diagnosis not present

## 2021-12-09 DIAGNOSIS — Z7901 Long term (current) use of anticoagulants: Secondary | ICD-10-CM | POA: Diagnosis not present

## 2021-12-09 DIAGNOSIS — Z86718 Personal history of other venous thrombosis and embolism: Secondary | ICD-10-CM | POA: Diagnosis not present

## 2021-12-09 DIAGNOSIS — Z7964 Long term (current) use of myelosuppressive agent: Secondary | ICD-10-CM | POA: Diagnosis not present

## 2021-12-09 DIAGNOSIS — Z79899 Other long term (current) drug therapy: Secondary | ICD-10-CM | POA: Diagnosis not present

## 2021-12-09 DIAGNOSIS — Z5112 Encounter for antineoplastic immunotherapy: Secondary | ICD-10-CM | POA: Diagnosis not present

## 2021-12-09 DIAGNOSIS — R531 Weakness: Secondary | ICD-10-CM | POA: Diagnosis not present

## 2021-12-09 DIAGNOSIS — I700 Atherosclerosis of aorta: Secondary | ICD-10-CM | POA: Diagnosis not present

## 2021-12-09 DIAGNOSIS — I6782 Cerebral ischemia: Secondary | ICD-10-CM | POA: Diagnosis not present

## 2021-12-09 DIAGNOSIS — I82452 Acute embolism and thrombosis of left peroneal vein: Secondary | ICD-10-CM | POA: Diagnosis not present

## 2021-12-09 DIAGNOSIS — I82441 Acute embolism and thrombosis of right tibial vein: Secondary | ICD-10-CM | POA: Diagnosis not present

## 2021-12-09 DIAGNOSIS — Z809 Family history of malignant neoplasm, unspecified: Secondary | ICD-10-CM | POA: Diagnosis not present

## 2021-12-09 DIAGNOSIS — Z8673 Personal history of transient ischemic attack (TIA), and cerebral infarction without residual deficits: Secondary | ICD-10-CM | POA: Diagnosis not present

## 2021-12-09 DIAGNOSIS — D5911 Warm autoimmune hemolytic anemia: Secondary | ICD-10-CM | POA: Diagnosis not present

## 2021-12-09 DIAGNOSIS — Z87891 Personal history of nicotine dependence: Secondary | ICD-10-CM | POA: Diagnosis not present

## 2021-12-09 DIAGNOSIS — E785 Hyperlipidemia, unspecified: Secondary | ICD-10-CM | POA: Diagnosis not present

## 2021-12-09 DIAGNOSIS — K429 Umbilical hernia without obstruction or gangrene: Secondary | ICD-10-CM | POA: Diagnosis not present

## 2021-12-09 LAB — CBC WITH DIFFERENTIAL/PLATELET
Abs Immature Granulocytes: 0.24 10*3/uL — ABNORMAL HIGH (ref 0.00–0.07)
Basophils Absolute: 0 10*3/uL (ref 0.0–0.1)
Basophils Relative: 0 %
Eosinophils Absolute: 0 10*3/uL (ref 0.0–0.5)
Eosinophils Relative: 0 %
HCT: 33.1 % — ABNORMAL LOW (ref 36.0–46.0)
Hemoglobin: 10.7 g/dL — ABNORMAL LOW (ref 12.0–15.0)
Immature Granulocytes: 3 %
Lymphocytes Relative: 15 %
Lymphs Abs: 1.4 10*3/uL (ref 0.7–4.0)
MCH: 39.5 pg — ABNORMAL HIGH (ref 26.0–34.0)
MCHC: 32.3 g/dL (ref 30.0–36.0)
MCV: 122.1 fL — ABNORMAL HIGH (ref 80.0–100.0)
Monocytes Absolute: 0.4 10*3/uL (ref 0.1–1.0)
Monocytes Relative: 4 %
Neutro Abs: 7 10*3/uL (ref 1.7–7.7)
Neutrophils Relative %: 78 %
Platelets: 278 10*3/uL (ref 150–400)
RBC: 2.71 MIL/uL — ABNORMAL LOW (ref 3.87–5.11)
RDW: 13.5 % (ref 11.5–15.5)
WBC: 9.1 10*3/uL (ref 4.0–10.5)
nRBC: 0 % (ref 0.0–0.2)

## 2021-12-09 LAB — RETIC PANEL
Immature Retic Fract: 21.7 % — ABNORMAL HIGH (ref 2.3–15.9)
RBC.: 2.69 MIL/uL — ABNORMAL LOW (ref 3.87–5.11)
Retic Count, Absolute: 190.2 10*3/uL — ABNORMAL HIGH (ref 19.0–186.0)
Retic Ct Pct: 7.1 % — ABNORMAL HIGH (ref 0.4–3.1)
Reticulocyte Hemoglobin: 37.5 pg (ref >27.9)

## 2021-12-09 LAB — COMPREHENSIVE METABOLIC PANEL
ALT: 39 U/L (ref 0–44)
AST: 22 U/L (ref 15–41)
Albumin: 4.1 g/dL (ref 3.5–5.0)
Alkaline Phosphatase: 58 U/L (ref 38–126)
Anion gap: 8 (ref 5–15)
BUN: 41 mg/dL — ABNORMAL HIGH (ref 8–23)
CO2: 27 mmol/L (ref 22–32)
Calcium: 9.5 mg/dL (ref 8.9–10.3)
Chloride: 103 mmol/L (ref 98–111)
Creatinine, Ser: 1.13 mg/dL — ABNORMAL HIGH (ref 0.44–1.00)
GFR, Estimated: 52 mL/min — ABNORMAL LOW (ref >60)
Glucose, Bld: 123 mg/dL — ABNORMAL HIGH (ref 70–99)
Potassium: 3.8 mmol/L (ref 3.5–5.1)
Sodium: 138 mmol/L (ref 135–145)
Total Bilirubin: 1.1 mg/dL (ref 0.3–1.2)
Total Protein: 6.4 g/dL — ABNORMAL LOW (ref 6.5–8.1)

## 2021-12-09 LAB — LACTATE DEHYDROGENASE: LDH: 242 U/L — ABNORMAL HIGH (ref 98–192)

## 2021-12-09 MED ORDER — ACETAMINOPHEN 325 MG PO TABS
650.0000 mg | ORAL_TABLET | Freq: Once | ORAL | Status: AC
  Administered 2021-12-09: 10:00:00 650 mg via ORAL
  Filled 2021-12-09: qty 2

## 2021-12-09 MED ORDER — SODIUM CHLORIDE 0.9 % IV SOLN
375.0000 mg/m2 | Freq: Once | INTRAVENOUS | Status: AC
  Administered 2021-12-09: 11:00:00 700 mg via INTRAVENOUS
  Filled 2021-12-09: qty 50

## 2021-12-09 MED ORDER — SODIUM CHLORIDE 0.9 % IV SOLN
Freq: Once | INTRAVENOUS | Status: AC
  Filled 2021-12-09: qty 250

## 2021-12-09 MED ORDER — DIPHENHYDRAMINE HCL 25 MG PO CAPS
50.0000 mg | ORAL_CAPSULE | Freq: Once | ORAL | Status: AC
  Administered 2021-12-09: 10:00:00 50 mg via ORAL
  Filled 2021-12-09: qty 2

## 2021-12-09 NOTE — Progress Notes (Signed)
Hematology/Oncology Consult note [facility]  Telephone:([phone] [phone]  Patient Care Team: Pcp, No as PCP - General [name], MD as Consulting Physician (Hematology and Oncology)   Name of the patient: Andrea Williamson  [id number]  [birth year]   Date of visit: 12/09/21  Diagnosis-warm autoimmune hemolytic anemia  Chief complaint/ Reason for visit-routine follow-up of hemolytic anemia to receive fourth dose of Rituxan  Heme/Onc history:  patient is a 71 year old female with a past medical history significant for hypertension and hyperlipidemia who was admitted to [facility] for symptoms of acute stroke when she presented with right lower extremity weakness and difficulty speaking on 10/01/2021.  CT head showed acute infarct in the left frontal lobe ACA territory.  She underwent cerebral angiogram and underwent thrombectomy with revascularization and left P2 stenting by Dr. Norma Fredrickson and subsequently discharged to rehab.  She was started on aspirin and Brilinta.  During that admission patient had a normal CBC with a white count of 9.3, H&H of 13/37.8 and a platelet count of 245.  Patient was noted to have increasing icterus as well as nausea and dizziness which prompted her visit to the ER.  On admission patient was noted to have a white cell count of 22, H&H of 4.5/13.4 and MCV of 111 with a platelet count of 352.  Total bilirubin was elevated at 6 thereby raising the concern for possible hemolytic anemia.  Patient was started on high-dose steroids and transferred to [facility].  She received 1 dose of rituximab there.  CT chest abdomen and pelvis with contrast did not show any evidence of malignancy but did show a possible Right adnexal mass which will be followed up by GYN as an outpatient.  Noted to have bilateral lower extremity DVT as well for which she is on Eliquis.  Interval history-patient reports feeling well overall.  She is currently on a  prednisone taper which she is tolerating it well.  Denies any specific complaints at this time.  She remains off Brilinta  ECOG PS- 1 Pain scale- 0   Review of systems- Review of Systems  Constitutional:  Negative for chills, fever, malaise/fatigue and weight loss.  HENT:  Negative for congestion, ear discharge and nosebleeds.   Eyes:  Negative for blurred vision.  Respiratory:  Negative for cough, hemoptysis, sputum production, shortness of breath and wheezing.   Cardiovascular:  Negative for chest pain, palpitations, orthopnea and claudication.  Gastrointestinal:  Negative for abdominal pain, blood in stool, constipation, diarrhea, heartburn, melena, nausea and vomiting.  Genitourinary:  Negative for dysuria, flank pain, frequency, hematuria and urgency.  Musculoskeletal:  Negative for back pain, joint pain and myalgias.  Skin:  Negative for rash.  Neurological:  Negative for dizziness, tingling, focal weakness, seizures, weakness and headaches.  Endo/Heme/Allergies:  Does not bruise/bleed easily.  Psychiatric/Behavioral:  Negative for depression and suicidal ideas. The patient does not have insomnia.      No Known Allergies   Past Medical History:  Diagnosis Date   Deep vein thrombosis (DVT) ([place])    Hypertension    Stroke [facility])      Past Surgical History:  Procedure Laterality Date   ANKLE SURGERY Left 2008   IR CT HEAD LTD  10/05/2021   IR INTRA CRAN STENT  10/01/2021   IR PERCUTANEOUS ART THROMBECTOMY/INFUSION INTRACRANIAL INC DIAG ANGIO  10/01/2021   IR US GUIDE VASC ACCESS RIGHT  10/01/2021   RADIOLOGY WITH ANESTHESIA N/A 10/01/2021   Procedure:  IR WITH ANESTHESIA;  Surgeon: Radiologist, Medication, MD;  Location: [place];  Service: Radiology;  Laterality: N/A;    Social History   Socioeconomic History   Marital status: Married    Spouse name: Not on file   Number of children: 3   Years of education: 12   Highest education level: 12th grade  Occupational  History   Occupation: Retired  Tobacco Use   Smoking status: Former    Packs/day: 1.50    Years: 50.00    Pack years: 75.00    Types: Cigarettes    Quit date: 09/2021    Years since quitting: 0.2   Smokeless tobacco: Never  Vaping Use   Vaping Use: Never used  Substance and Sexual Activity   Alcohol use: Not Currently   Drug use: Not Currently   Sexual activity: Not Currently  Other Topics Concern   Not on file  Social History Narrative   October 2022 stopped   Social Determinants of Health   Financial Resource Strain: Low Risk    Difficulty of Paying Living Expenses: Not hard at all  Food Insecurity: No Food Insecurity   Worried About Charity fundraiser in the Last Year: Never true   Arboriculturist in the Last Year: Never true  Transportation Needs: No Transportation Needs   Lack of Transportation (Medical): No   Lack of Transportation (Non-Medical): No  Physical Activity: Insufficiently Active   Days of Exercise per Week: 2 days   Minutes of Exercise per Session: 60 min  Stress: Stress Concern Present   Feeling of Stress : To some extent  Social Connections: Moderately Isolated   Frequency of Communication with Friends and Family: More than three times a week   Frequency of Social Gatherings with Friends and Family: More than three times a week   Attends Religious Services: Never   Marine scientist or Organizations: No   Attends Music therapist: Never   Marital Status: Married  Human resources officer Violence: Not At Risk   Fear of Current or Ex-Partner: No   Emotionally Abused: No   Physically Abused: No   Sexually Abused: No    Family History  Problem Relation Age of Onset   COPD Mother    Cancer Mother      Current Outpatient Medications:    acetaminophen (TYLENOL) 325 MG tablet, Take 1-2 tablets (325-650 mg total) by mouth every 4 (four) hours as needed for mild pain., Disp: , Rfl:    ALPRAZolam (XANAX) 0.25 MG tablet, Take 1 tablet  (0.25 mg total) by mouth at bedtime as needed for anxiety. Take 1/2 tablet at bedtime for difficulty sleeping., Disp: 15 tablet, Rfl: 0   amLODipine (NORVASC) 5 MG tablet, Take 1 tablet (5 mg total) by mouth daily., Disp: 90 tablet, Rfl: 1   apixaban (ELIQUIS) 5 MG TABS tablet, Take 1 tablet (5 mg total) by mouth 2 (two) times daily., Disp: 60 tablet, Rfl: 1   atorvastatin (LIPITOR) 80 MG tablet, Take 1 tablet (80 mg total) by mouth daily., Disp: 90 tablet, Rfl: 1   ferrous sulfate 325 (65 FE) MG tablet, Take 325 mg by mouth daily with breakfast., Disp: , Rfl:    folic acid (FOLVITE) 1 MG tablet, Take 1 tablet (1 mg total) by mouth daily., Disp: , Rfl:    hydrALAZINE (APRESOLINE) 25 MG tablet, Take 1 tablet (25 mg total) by mouth every 8 (eight) hours as needed (SBP >170 or DBP >110)., Disp: ,  Rfl:    magnesium gluconate (MAGONATE) 500 MG tablet, Take 0.5 tablets (250 mg total) by mouth at bedtime., Disp: 30 tablet, Rfl: 0   ondansetron (ZOFRAN-ODT) 4 MG disintegrating tablet, Take 1 tablet (4 mg total) by mouth once as needed for nausea., Disp: 20 tablet, Rfl: 0   pantoprazole (PROTONIX) 40 MG tablet, Take 1 tablet (40 mg total) by mouth daily., Disp: 30 tablet, Rfl: 1   predniSONE (DELTASONE) 10 MG tablet, Take 1 tablet (10 mg total) by mouth daily with breakfast. 7 pills daily x 4 days, then 6 pills daily x 4 days, 5 pills daily x4 days, 4 pills x 4 days, 3 pills x 4 days, 2 pills x 4 days, then 1 pill x 4 days and then stop, Disp: 112 tablet, Rfl: 0  Physical exam:  Vitals:   12/09/21 0833  BP: (!) 145/70  Pulse: 68  Resp: 16  Temp: (!) 97.2 F (36.2 C)  TempSrc: Tympanic  SpO2: 99%  Weight: 156 lb (70.8 kg)  Height: 5\' 2"  (1.575 m)   Physical Exam Constitutional:      General: She is not in acute distress. Cardiovascular:     Rate and Rhythm: Normal rate and regular rhythm.     Heart sounds: Normal heart sounds.  Pulmonary:     Effort: Pulmonary effort is normal.     Breath  sounds: Normal breath sounds.  Skin:    General: Skin is warm and dry.  Neurological:     Mental Status: She is alert and oriented to person, place, and time.     CMP Latest Ref Rng & Units 12/09/2021  Glucose 70 - 99 mg/dL 123(H)  BUN 8 - 23 mg/dL 41(H)  Creatinine 0.44 - 1.00 mg/dL 1.13(H)  Sodium 135 - 145 mmol/L 138  Potassium 3.5 - 5.1 mmol/L 3.8  Chloride 98 - 111 mmol/L 103  CO2 22 - 32 mmol/L 27  Calcium 8.9 - 10.3 mg/dL 9.5  Total Protein 6.5 - 8.1 g/dL 6.4(L)  Total Bilirubin 0.3 - 1.2 mg/dL 1.1  Alkaline Phos 38 - 126 U/L 58  AST 15 - 41 U/L 22  ALT 0 - 44 U/L 39   CBC Latest Ref Rng & Units 12/09/2021  WBC 4.0 - 10.5 K/uL 9.1  Hemoglobin 12.0 - 15.0 g/dL 10.7(L)  Hematocrit 36.0 - 46.0 % 33.1(L)  Platelets 150 - 400 K/uL 278    No images are attached to the encounter.  CT Head Wo Contrast  Result Date: 11/12/2021 CLINICAL DATA:  Dizziness nausea vomiting EXAM: CT HEAD WITHOUT CONTRAST TECHNIQUE: Contiguous axial images were obtained from the base of the skull through the vertex without intravenous contrast. COMPARISON:  CT brain 10/01/2021 FINDINGS: Brain: No acute territorial infarction, hemorrhage, or intracranial mass. Scattered encephalomalacia within the left frontal lobe and white matter corresponding to infarcts on previous MRI. Mild atrophy. Chronic small vessel ischemic changes of the white matter. Vague hypodensity within the left cerebellum corresponding to small infarct on prior MRI. Vascular: Left A2 stent as before.  Carotid vascular calcifications. Skull: Normal. Negative for fracture or focal lesion. Sinuses/Orbits: No acute finding. Other: None IMPRESSION: 1. No CT evidence for acute intracranial abnormality. Atrophy and chronic small vessel ischemic changes of the white matter 2. Multiple small chronic appearing left ACA infarcts as noted on previous imaging. Electronically Signed   By: Donavan Foil M.D.   On: 11/12/2021 22:40   MR PELVIS W WO  CONTRAST  Result Date: 11/18/2021 CLINICAL DATA:  Right adnexal mass on prior ultrasound. EXAM: MRI PELVIS WITHOUT AND WITH CONTRAST TECHNIQUE: Multiplanar multisequence MR imaging of the pelvis was performed both before and after administration of intravenous contrast. CONTRAST:  78mL GADAVIST GADOBUTROL 1 MMOL/ML IV SOLN COMPARISON:  Ultrasound and CTs of 11/13/2021. FINDINGS: Urinary Tract: Normal urinary bladder. No distal hydroureter. Normal imaged kidneys. Bowel: Normal pelvic bowel loops. Vascular/Lymphatic: Aortic atherosclerosis. No pelvic aneurysm or sidewall adenopathy. Reproductive: Uterus: Measures 5.6 x 4.3 by 6.7 cm (volume = 84 cm^3). Demonstrates a T2 hypointense left-sided intramural lesion which is most consistent with a fibroid. This avidly enhances. No endometrial thickening. Right ovary: Corresponding to the ultrasound abnormality, a complex cystic lesion within the right ovary demonstrates T2 hyperintensity and T1 hyperintensity at 2.9 x 3.0 cm on 18/3. Anteriorly, there is a 1.0 cm enhancing soft tissue component on 42/26 which corresponds to the ultrasound area of vascularity. The remaining more cephalad and anterior soft tissue signal including at 1.7 x 2.6 cm on 38/26 is favored to represent normal ovarian parenchyma. Left ovary:  Normal, including on 12/03. Other: No significant free fluid. Musculoskeletal: Lumbosacral spondylosis, including discogenic edema at L4-5. A T2 hyperintense right iliac lesion of 2.0 cm on 08/03 is consistent with a hemangioma. IMPRESSION: 1. Complex solid and cystic 2.9 x 3.0 cm right ovarian mass which (especially given the ultrasound appearance) is highly suspicious for cystic neoplasm. Consider gynecological oncology consultation. 2. Left-sided uterine fibroid. Electronically Signed   By: Abigail Miyamoto M.D.   On: 11/18/2021 20:28   CT CHEST ABDOMEN PELVIS W CONTRAST  Result Date: 11/13/2021 CLINICAL DATA:  Occult malignancy.  Nausea. EXAM: CT CHEST,  ABDOMEN, AND PELVIS WITH CONTRAST TECHNIQUE: Multidetector CT imaging of the chest, abdomen and pelvis was performed following the standard protocol during bolus administration of intravenous contrast. CONTRAST:  135mL OMNIPAQUE IOHEXOL 300 MG/ML  SOLN COMPARISON:  None. FINDINGS: CT CHEST FINDINGS Cardiovascular: Heart size is normal. Coronary artery calcifications are present. The appearance of the pulmonary arteries is unremarkable accounting for the contrast bolus timing. There is atherosclerotic calcification of the thoracic aorta, not associated with aneurysm. Mediastinum/Nodes: The visualized portion of the thyroid gland has a normal appearance. There is mild thickening of the LOWER esophageal wall, not associated with discrete mass. No significant mediastinal, hilar, or axillary adenopathy. Lungs/Pleura: Minimal LEFT apical pleuroparenchymal changes. No pulmonary nodules. No consolidations or pleural effusions. Musculoskeletal: Mild midthoracic degenerative changes. CT ABDOMEN PELVIS FINDINGS Hepatobiliary: Mildly prominent RIGHT hepatic lobe, variation of normal. No focal hepatic lesions. Normal parenchymal density. Gallbladder is present. Pancreas: Unremarkable. No pancreatic ductal dilatation or surrounding inflammatory changes. Spleen: Normal in size without focal abnormality. Adrenals/Urinary Tract: Adrenal glands are normal in appearance. A 1 centimeter benign cyst is identified in the UPPER pole of the RIGHT kidney. LEFT kidney is unremarkable. Ureters are unremarkable. The bladder and visualized portion of the urethra are normal. Stomach/Bowel: Mild thickening of the LOWER esophageal wall. Stomach is otherwise unremarkable. Small bowel loops are normal in appearance. Moderate stool burden identified within the rectosigmoid colon. Large bowel is otherwise unremarkable. The appendix is well seen and normal in appearance. Vascular/Lymphatic: There is mild, nonspecific stranding within the mesentery in  the RIGHT UPPER QUADRANT. Small lymph nodes are identified within this region, largest measuring 7 millimeters in short axis, image 76 of series 2. No retroperitoneal adenopathy. There is atherosclerotic calcification of the abdominal aorta, not associated with aneurysm. There is normal vascular opacification of the celiac axis, superior mesenteric artery, and inferior  mesenteric artery. Normal appearance of the portal venous system and inferior vena cava. Reproductive: The uterus is present. A central lobulated enhancing mass likely represents fibroid, measuring 3.0 centimeters on image 105 of series 2. The RIGHT adnexal region is notable for a solid and cystic mass measuring 4.9 x 3.4 centimeters on image 102 of series 2. LEFT adnexal region is unremarkable. No ascites. Other: Anterior abdominal wall is notable for fat containing paraumbilical hernia. Musculoskeletal: No acute or significant osseous findings. IMPRESSION: 1. No acute abnormality of the chest. 2. Solid and cystic mass within the RIGHT adnexa, likely representing ovarian lesion measuring 4.9 centimeters. Recommend further characterization with pelvic ultrasound. 3. Nonspecific mesenteric stranding in the RIGHT UPPER QUADRANT, associated subcentimeter lymph nodes. Consider follow-up in 3-6 months to assess for stability. 4.  Aortic atherosclerosis.  (ICD10-I70.0) 5. RIGHT renal cyst. 6. Uterine fibroid. 7. Mild thickening of the LOWER esophagus, raising question of esophagitis. Electronically Signed   By: Nolon Nations M.D.   On: 11/13/2021 08:23   US Venous Img Lower Bilateral (DVT)  Result Date: 11/13/2021 CLINICAL DATA:  Hemolytic anemia EXAM: BILATERAL LOWER EXTREMITY VENOUS DOPPLER ULTRASOUND TECHNIQUE: Gray-scale sonography with compression, as well as color and duplex ultrasound, were performed to evaluate the deep venous system(s) from the level of the common femoral vein through the popliteal and proximal calf veins. COMPARISON:   None. FINDINGS: VENOUS Normal compressibility, phasicity, and augmentation of the common femoral, superficial femoral, and popliteal veins bilaterally. However, there is occlusive thrombus in the right posterior tibial vein, and occlusive thrombus in the left peroneal vein. OTHER None. Limitations: none IMPRESSION: 1. Today's exam is positive for bilateral below knee deep vein thrombosis (BKDVT), with occlusive thrombus in the right posterior tibial vein and in the left peroneal vein. Electronically Signed   By: Van Clines M.D.   On: 11/13/2021 09:19   US PELVIC COMPLETE WITH TRANSVAGINAL  Result Date: 11/13/2021 CLINICAL DATA:  Right adnexal mass seen in the CT done earlier today EXAM: TRANSABDOMINAL AND TRANSVAGINAL ULTRASOUND OF PELVIS DOPPLER ULTRASOUND OF OVARIES TECHNIQUE: Both transabdominal and transvaginal ultrasound examinations of the pelvis were performed. Transabdominal technique was performed for global imaging of the pelvis including uterus, ovaries, adnexal regions, and pelvic cul-de-sac. It was necessary to proceed with endovaginal exam following the transabdominal exam to visualize the ovaries. Color and duplex Doppler ultrasound was utilized to evaluate blood flow to the ovaries. COMPARISON:  CT done earlier today FINDINGS: Uterus Measurements: 5.9 x 2.4 x 5 cm = volume: 37.2 mL. There is 4 x 2.7 x 2.7 cm fibroid in the left side of body. Endometrium Thickness: 2.9 mm.  No focal abnormality visualized. Right ovary Measurements: 3.9 x 2.9 x 3.4 cm = volume: 20.15 mL. Is a complex right adnexal lesion. Lesion is mostly cystic with a lobulated solid component in the margin. There is demonstrable vascular flow in the solid component. Cystic portion of the lesion measures 3.9 x 2.6 x 3.3 cm. Solid component in the margin measures 1.4 x 0.8 x 1.2 cm. Left ovary Not sonographically visualized. Pulsed Doppler evaluation of right ovary demonstrates normal low-resistance arterial and venous  waveforms. Other findings No abnormal free fluid. IMPRESSION: There is a complex lesion in the right adnexa with mostly cystic echogenicity along with a lobulated solid nodule measuring 1.4 cm in size with demonstrable internal vascularity. Possibility of malignant neoplastic process in the right ovary is not excluded. 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 Left ovary is  not sonographically visualized. Electronically Signed   By: Elmer Picker M.D.   On: 11/13/2021 12:26     Assessment and plan- Patient is a 71 y.o. female with warm autoimmune hemolytic anemia here for on treatment assessment prior to fourth dose of Rituxan  Counts okay to proceed with dose 4 of Rituxan today.  Overall her hemolytic anemia has improved as evidenced by improvement in her hemoglobin from 4.52 months ago to 10.7 presently.  Reticulocyte count and LDH are going down and haptoglobin is improving.  This will be her last dose of Rituxan.  She is currently on a slow prednisone taper and will likely come off prednisone over the next 4 weeks.  Repeat CBC with differential, CMP, reticulocyte haptoglobin and LDH in 2 weeks in 4 weeks and I will see her back in 4 weeks.  History of bilateral lower extremity DVT: Continue Eliquis  History of recent stroke: She has still not heard back about her Brilinta from neurology.  She is not currently on Brilinta but her discharge summary did mention that she needs to stay on it   Visit Diagnosis 1. Other autoimmune hemolytic anemia (HCC)   2. Acute deep vein thrombosis (DVT) of both lower extremities, unspecified vein (HCC)      Dr. Randa Evens, MD, MPH [facility] [phone] 12/09/2021 11:06 AM

## 2021-12-10 ENCOUNTER — Telehealth: Payer: Self-pay | Admitting: Neurology

## 2021-12-10 LAB — HAPTOGLOBIN: Haptoglobin: 35 mg/dL — ABNORMAL LOW (ref 37–355)

## 2021-12-10 NOTE — Telephone Encounter (Signed)
Please tell patient and pcp she needs to be on brilinta per neurology recs. She also needs to eb on eiquis. Pcp to handle brilinta until neurology sees her outpatient

## 2021-12-10 NOTE — Telephone Encounter (Signed)
Received correspondence from Dr. Elroy Channel office from [facility] regarding if patient should still be on [place]. I have spoke with both Dr. Leonie Man and Frann Rider regarding this. Per Dr. Bea Graff has intracranial stent and typically we treat with dual antiplatelet therapy for that.  If patient has an indication for Eliquis then perhaps aspirin may be discontinued and patient will need to stay on Eliquis and Brilinta.  Patient can also follow-up with interventional neuroradiologist Dr. Norma Fredrickson for her opinion for how to manage the stent". Patient should follow up with Dr. Norma Fredrickson interventional neuroradiologist for final determination. Patient should also be r/s and put on Dr. Clydene Fake schedule instead of with Janett Billow.

## 2021-12-11 ENCOUNTER — Ambulatory Visit: Payer: Medicare Other

## 2021-12-11 ENCOUNTER — Telehealth: Payer: Self-pay | Admitting: *Deleted

## 2021-12-11 ENCOUNTER — Other Ambulatory Visit: Payer: Self-pay

## 2021-12-11 DIAGNOSIS — R2689 Other abnormalities of gait and mobility: Secondary | ICD-10-CM | POA: Diagnosis not present

## 2021-12-11 DIAGNOSIS — R2681 Unsteadiness on feet: Secondary | ICD-10-CM | POA: Diagnosis not present

## 2021-12-11 DIAGNOSIS — R278 Other lack of coordination: Secondary | ICD-10-CM | POA: Diagnosis not present

## 2021-12-11 DIAGNOSIS — M6281 Muscle weakness (generalized): Secondary | ICD-10-CM

## 2021-12-11 NOTE — Therapy (Signed)
[place] MAIN [facility] SERVICES [address] [place], [place], [address] Phone: [phone]   Fax:  [phone]  Physical Therapy Treatment  Patient Details  Name: Andrea Williamson MRN: [id number] Date of Birth: 06-08-1951 Referring Provider (PT): Reesa Chew, [place]   Encounter Date: 12/11/2021   PT End of Session - 12/11/21 1027     Visit Number 12    Number of Visits 25    Date for PT Re-Evaluation 01/13/22    Authorization Time Period Eval performed: 10/21/21    Progress Note Due on Visit 10    PT Start Time 0932    PT Stop Time 1016    PT Time Calculation (min) 44 min    Equipment Utilized During Treatment Gait belt    Activity Tolerance Patient tolerated treatment well    Behavior During Therapy [facility] for tasks assessed/performed;Agitated             Past Medical History:  Diagnosis Date   Deep vein thrombosis (DVT) ([place])    Hypertension    Stroke [facility])     Past Surgical History:  Procedure Laterality Date   ANKLE SURGERY Left 2008   IR CT HEAD LTD  10/05/2021   IR INTRA CRAN STENT  10/01/2021   IR PERCUTANEOUS ART THROMBECTOMY/INFUSION INTRACRANIAL INC DIAG ANGIO  10/01/2021   IR US GUIDE VASC ACCESS RIGHT  10/01/2021   RADIOLOGY WITH ANESTHESIA N/A 10/01/2021   Procedure: IR WITH ANESTHESIA;  Surgeon: Radiologist, Medication, MD;  Location: [place];  Service: Radiology;  Laterality: N/A;    There were no vitals filed for this visit.   Subjective Assessment - 12/11/21 0931     Subjective Pt reports she had her last infusion the other day. Pt reports she has reached up to [place] steps/day, but hasn't been able to walk much recently due to weather. Pt reports she has not been doing much exercise other than walking. Pt reports she is on a prednisone taper.    Patient is accompained by: Family member    Pertinent History Pt is a 71 y/o female presenting with R LE strength and balance deficits. Pt is s/p L hemisphere CVA, admitted to the  ED 10/01/21. Imaging included MRI scans on 10/02/21 yielding "Redemonstrated infarcts in the left ACA and ACA/PCA watershed  territory, with one new area of restricted diffusion in the medial right parietal lobe, likely an additional acute infarct, status post  A2 stenting. A small amount of associated petechial hemorrhage is  noted within the medial left frontal lobe, without significant  hemorrhagic transformation." per Dr. Merilyn Baba, MD. Pt has PMH that includes HLD.    Limitations Walking;Standing    How long can you sit comfortably? "a long time"    How long can you stand comfortably? 30 minutes to an hour    How long can you walk comfortably? 7 minutes    Diagnostic tests CT (10/01/21) - 1. The right common femoral artery has normal caliber, adequate for   vascular access.   2. 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   3. Intracranial atherosclerotic disease with multifocal areas of   mild stenosis in the left ACA and MCA vascular trees. MRI HEAD WITHOUT CONTRAST (10/12/21) - "Redemonstrated infarcts in the left ACA and ACA/PCA watershed  territory, with one new area of restricted diffusion in the medial right parietal lobe, likely an additional acute infarct, status post  A2 stenting. A small amount of associated petechial  hemorrhage is  noted within the medial left frontal lobe, without significant  hemorrhagic transformation."    Patient Stated Goals wants to be able to go back to bowling in january, taking care of her dog and cat    Currently in Pain? No/denies            INTERVENTIONS   STS 1x20 hands-free Pt progressed to performing STS with 7# dumbbell held in BUEs - 10x   Walking lunges in // bars 2x10 each LE with BUE support. Pt reports intervention as easy. Pt exhibits fatigue AEB decreased ROM with reps. Pt reports feeling exercise in RLE and not in LLE.  Single leg heel raise - 2x15 B, 1x10 RLE. Pt exhibits compensation with anterior lean/use of momentum. Pt currently  able to perform through limited ROM B.  Seated DF 20x with 2-3 sec holds at end range; RLE DF 22x with 1-3 sec holds. Pt rates intervention as easy.   Seated RLE march 10x with 5 sec holds at end range. Pt rates as easy. --progressed to performing with 2# weights for 10x each LE  Standing march alternating 16x with 3 sec holds at end range. Pt wearing 2# ankle weights. Pt rates interventions as easy. Pt exhibits decreased eccentric control with RLE.   Standing hip abduction 15x B  --progressed to performing with 2# ankle weights 15 B   LAQ with 2# weights donned 20x alternating and 3 sec hold at end range.  Pt takes seated rest breaks throughout.   PT instructs pt to take rest day tomorrow from HEP with the exception of ambulating throughout the day to promote recovery.   Pt educated throughout session about proper posture and technique with exercises. Improved exercise technique, movement at target joints, use of target muscles after min to mod verbal, visual, tactile cues.     PT Short Term Goals - 12/01/21 0939       PT SHORT TERM GOAL #1   Title Pt will be independent with HEP in order to improve strength and balance in order to decrease fall risk and improve function at home and work.    Baseline 10/21/21: HEP deferred to next visit; 1/17: Pt performing progressive walking program, pt to increase time ambulated by 5 minutes    Time 6    Period Weeks    Status On-going    Target Date 01/12/22               PT Long Term Goals - 12/01/21 0939       PT LONG TERM GOAL #1   Title Pt will improve FOTO score to >71% to show improved functional mobility with ADLs    Baseline 10/21/21: 58%; 12/01/2021: 68%    Time 12    Period Weeks    Status Partially Met    Target Date 01/13/22      PT LONG TERM GOAL #2   Title Pt will improve BERG by at least 3 points in order to demonstrate clinically significant improvement in balance.    Baseline 10/21/21: 44/56; 1/17: 54/56    Time  12    Period Weeks    Status Achieved    Target Date 01/13/22      PT LONG TERM GOAL #3   Title Pt will decrease 5TSTS by at least 3 seconds in order to demonstrate clinically significant improvement in LE strength.    Baseline 10/21/21: 16.3 sec without UE support; 1/17: 13 sec hands-free    Time  12    Period Weeks    Status Achieved    Target Date 01/13/22      PT LONG TERM GOAL #4   Title Pt will improve 10MWT gait speed to >1.0 m/s with least restrictive AD to show clinically significant improvement and decrease risk of falls.    Baseline 10/21/21: 0.82 m/s with 2WW and CGA; 1/17 1 m/s without AD    Time 12    Period Weeks    Status Partially Met    Target Date 01/13/22      PT LONG TERM GOAL #5   Title Pt will increase 6MWT by at least 58m(1657f in order to demonstrate clinically significant improvement in cardiopulmonary endurance and community ambulation    Baseline 10/21/21: deferred to next visit; 10/27/2021= 790 feet with front wheeled walker; 1/17: 1065 ft no AD    Time 12    Period Weeks    Status Achieved    Target Date 01/13/22      Additional Long Term Goals   Additional Long Term Goals Yes      PT LONG TERM GOAL #6   Title Pt will report ability to ambulate without her RW outside for at least 30 minutes and without steadiness in order to return patient to PLOF    Baseline 1/17: new    Time 12    Period Weeks    Status New    Target Date 02/23/22      PT LONG TERM GOAL #7   Title Pt will increase 6MWT by at least 200 ft in order to demonstrate clinically significant improvement in cardiopulmonary endurance and community ambulation    Baseline 1/17: 1065 ft (achieved previous 6MWT goal, can now be progressed further toward PLOF)    Time 12    Period Weeks    Status New    Target Date 02/23/22                   Plan - 12/11/21 1032     Clinical Impression Statement Pt tolerates interventions well without reports of lightheadedness. Pt rates  majority of interventions as easy. However, pt does exhibit continued decreased eccentric control of RLE and performs majority of interventions through decreased ROM on R. The pt is able to intermittently correct this with cuing. Pt continues to exhibit agitation throughout appointment. The pt will benefit from further skilled PT to continue to improve strength, balance, endurance and gait to return to PLOF.    Personal Factors and Comorbidities Age;Comorbidity 1;Sex;Time since onset of injury/illness/exacerbation;Fitness;Social Background    Comorbidities HLD    Examination-Activity Limitations Bathing;Bed Mobility;Caring for Others;Reach Overhead;Hygiene/Grooming;Self Feeding;Squat;Stairs;Transfers;Toileting;Sleep;Locomotion Level;Lift;Stand    Examination-Participation Restrictions Cleaning;Community Activity;Shop;Laundry;Meal Prep;Volunteer;Driving;Interpersonal Relationship    Stability/Clinical Decision Making Stable/Uncomplicated    Rehab Potential Good    PT Frequency 2x / week    PT Duration 12 weeks    PT Treatment/Interventions ADLs/Self Care Home Management;Cryotherapy;Electrical Stimulation;Moist Heat;Ultrasound;Gait training;Stair training;DME Instruction;Functional mobility training;Therapeutic activities;Therapeutic exercise;Balance training;Neuromuscular re-education;Patient/family education;Manual techniques;Energy conservation;Passive range of motion;Joint Manipulations;Splinting;Taping;Vestibular;Visual/perceptual remediation/compensation;Wheelchair mobility training;Canalith Repostioning    PT Next Visit Plan Progress dynamic balance, strengthening as pt is able, continue to monitor BP, progress endurance, continue POC as previously indicated    PT Home Exercise Plan 10/27/2021=Access Code: [email or web address]: Education on progressing home walking program. PT recommends pt increase walking duration by 5 minutes this week.    Consulted and Agree with Plan of Care Patient  Patient will benefit from skilled therapeutic intervention in order to improve the following deficits and impairments:  Abnormal gait, Decreased activity tolerance, Decreased endurance, Decreased knowledge of use of DME, Decreased strength, Impaired sensation, Hypomobility, Decreased range of motion, Impaired UE functional use, Improper body mechanics, Pain, Decreased balance, Decreased coordination, Decreased mobility, Decreased safety awareness, Difficulty walking, Impaired flexibility, Postural dysfunction  Visit Diagnosis: Muscle weakness (generalized)  Other abnormalities of gait and mobility     Problem List Patient Active Problem List   Diagnosis Date Noted   History of stroke 11/13/2021   Essential hypertension 11/13/2021   DVT (deep venous thrombosis) ([place]) 11/13/2021   Adnexal mass 11/13/2021   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 [place], [place], [address] Phone: [phone]   Fax:  [phone]  Name: Andrea Williamson MRN: [id number] Date of Birth: [birth year]

## 2021-12-11 NOTE — Telephone Encounter (Signed)
Called the daughter tiffany and let her know that we have checked with neurologist in hospital, then sent message to neurologist that pt will see on 2/22. Both AYO:KHTX pt should be on both eliquis and brilinta.  This is the chat:Per Dr. Bea Graff has intracranial stent and typically we treat with dual antiplatelet therapy for that.  If patient has an indication for Eliquis then perhaps aspirin may be discontinued and patient will need to stay on Eliquis and Brilinta.  Patient can also follow-up with interventional neuroradiologist Dr. Norma Fredrickson for her opinion for how to manage the stent   Then Dr. Janese Banks told me to tell daughter and PCP to be on the [facility] as well as eliquis. Daughter will call PCP office on Monday about this. And I called and left message to Dr. Lavera Guise

## 2021-12-11 NOTE — Telephone Encounter (Signed)
Called daughter and spoke to her and called Dr. Lavera Guise and got voicemail and left message about this and our phone number if they need things

## 2021-12-14 ENCOUNTER — Other Ambulatory Visit: Payer: Self-pay

## 2021-12-16 ENCOUNTER — Other Ambulatory Visit: Payer: Self-pay

## 2021-12-16 ENCOUNTER — Ambulatory Visit: Payer: Medicare Other | Attending: Physical Medicine and Rehabilitation

## 2021-12-16 DIAGNOSIS — R2689 Other abnormalities of gait and mobility: Secondary | ICD-10-CM | POA: Insufficient documentation

## 2021-12-16 DIAGNOSIS — R2681 Unsteadiness on feet: Secondary | ICD-10-CM | POA: Insufficient documentation

## 2021-12-16 DIAGNOSIS — R269 Unspecified abnormalities of gait and mobility: Secondary | ICD-10-CM | POA: Insufficient documentation

## 2021-12-16 DIAGNOSIS — R262 Difficulty in walking, not elsewhere classified: Secondary | ICD-10-CM | POA: Diagnosis not present

## 2021-12-16 DIAGNOSIS — M6281 Muscle weakness (generalized): Secondary | ICD-10-CM | POA: Insufficient documentation

## 2021-12-16 DIAGNOSIS — R278 Other lack of coordination: Secondary | ICD-10-CM | POA: Insufficient documentation

## 2021-12-16 NOTE — Therapy (Signed)
[place] MAIN [facility] SERVICES [address] [place], [place], [address] Phone: [phone]   Fax:  [phone]  Physical Therapy Treatment  Patient Details  Name: Andrea Williamson MRN: [id number] Date of Birth: 11-14-1951 Referring Provider (PT): Reesa Chew, [place]   Encounter Date: 12/16/2021   PT End of Session - 12/16/21 1725     Visit Number 13    Number of Visits 25    Date for PT Re-Evaluation 01/13/22    Authorization Time Period Eval performed: 10/21/21    Progress Note Due on Visit 10    PT Start Time 0846    PT Stop Time 0928    PT Time Calculation (min) 42 min    Equipment Utilized During Treatment Gait belt    Activity Tolerance Patient tolerated treatment well;Treatment limited secondary to agitation    Behavior During Therapy [facility] for tasks assessed/performed;Agitated             Past Medical History:  Diagnosis Date   Deep vein thrombosis (DVT) ([place])    Hypertension    Stroke [facility])     Past Surgical History:  Procedure Laterality Date   ANKLE SURGERY Left 2008   IR CT HEAD LTD  10/05/2021   IR INTRA CRAN STENT  10/01/2021   IR PERCUTANEOUS ART THROMBECTOMY/INFUSION INTRACRANIAL INC DIAG ANGIO  10/01/2021   IR US GUIDE VASC ACCESS RIGHT  10/01/2021   RADIOLOGY WITH ANESTHESIA N/A 10/01/2021   Procedure: IR WITH ANESTHESIA;  Surgeon: Radiologist, Medication, MD;  Location: [place];  Service: Radiology;  Laterality: N/A;    There were no vitals filed for this visit.    Subjective Assessment - 12/16/21 0849     Subjective Pt reports she has been increasingly tired over the past week. She reports no changes in medications, no other changes. Pt SIL present and reports pt is wearing a heart monitor until Friday. Pt has upcoming follow-up with her neurologist. Per pt reports it is unclear if she currently has RLE pain/soreness or if she had RLE pain/soreness following previous appointment.    Patient is accompained by: Family  member    Pertinent History Pt is a 71 y/o female presenting with R LE strength and balance deficits. Pt is s/p L hemisphere CVA, admitted to the ED 10/01/21. Imaging included MRI scans on 10/02/21 yielding "Redemonstrated infarcts in the left ACA and ACA/PCA watershed  territory, with one new area of restricted diffusion in the medial right parietal lobe, likely an additional acute infarct, status post  A2 stenting. A small amount of associated petechial hemorrhage is  noted within the medial left frontal lobe, without significant  hemorrhagic transformation." per Dr. Merilyn Baba, MD. Pt has PMH that includes HLD.    Limitations Walking;Standing    How long can you sit comfortably? "a long time"    How long can you stand comfortably? 30 minutes to an hour    How long can you walk comfortably? 7 minutes    Diagnostic tests CT (10/01/21) - 1. The right common femoral artery has normal caliber, adequate for   vascular access.   2. 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   3. Intracranial atherosclerotic disease with multifocal areas of   mild stenosis in the left ACA and MCA vascular trees. MRI HEAD WITHOUT CONTRAST (10/12/21) - "Redemonstrated infarcts in the left ACA and ACA/PCA watershed  territory, with one new area of restricted diffusion in the medial right parietal lobe,  likely an additional acute infarct, status post  A2 stenting. A small amount of associated petechial hemorrhage is  noted within the medial left frontal lobe, without significant  hemorrhagic transformation."    Patient Stated Goals wants to be able to go back to bowling in january, taking care of her dog and cat             INTERVENTIONS  Vitals assessed as pt reports increased fatigue over past week- Seated BP LUE: 130/77 mmHg, HR 68 bpm SPO2% 99% Pt denies other symptoms.    STS hands-free with 2000 gr ball 2x10   Walking lunges in // bars 2x4 reps performed over length of // bars   Standing hip abduction  2x15 BLEs  Single leg heel raise 1x10 B. Pt initially attempts 15 but states, "I just can't do it."   Pt demonstrates frustration/agitation throughout while performing interventions.   Neuro: gait belt donned and CGA provided unless otherwise specified  Firm surface: NBOS EO 30 sec NBOS EC 2x30 sec NBOS EC with vertical and horizontal head turns 10-15 reps of each performed in each direction. Pt exhibits increased sway, decreased postural stability but no LOB.  On airex: NBOS EO 30 sec NBOS EC with one finger on bar for increased TC/stability 5x30 sec  Tandem stance 2x30 sec each LE; pt requires intermittent UE support  SLB 1x30 sec each LE. Pt requires toe-touch and intermittent UE support.   Education provided throughout regarding importance of interventions for progress and prognosis, importance of balance interventions and HEP.  Access Code: [email or web address] URL: [email or web address] Date: 12/16/2021 Prepared by: Ricard Dillon  Exercises Standing Tandem Balance with Counter Support - 1 x daily - 7 x weekly - 2 sets - 2 reps - 30 hold    Pt educated throughout session about proper posture and technique with exercises. Improved exercise technique, movement at target joints, use of target muscles after min to mod verbal, visual, tactile cues.      PT Education - 12/16/21 1724     Education Details exercise technique    Person(s) Educated Patient    Methods Explanation;Demonstration;Verbal cues    Comprehension Verbalized understanding;Returned demonstration;Verbal cues required;Need further instruction              PT Short Term Goals - 12/01/21 0939       PT SHORT TERM GOAL #1   Title Pt will be independent with HEP in order to improve strength and balance in order to decrease fall risk and improve function at home and work.    Baseline 10/21/21: HEP deferred to next visit; 1/17: Pt performing progressive walking program, pt to increase time ambulated by 5  minutes    Time 6    Period Weeks    Status On-going    Target Date 01/12/22               PT Long Term Goals - 12/01/21 0939       PT LONG TERM GOAL #1   Title Pt will improve FOTO score to >71% to show improved functional mobility with ADLs    Baseline 10/21/21: 58%; 12/01/2021: 68%    Time 12    Period Weeks    Status Partially Met    Target Date 01/13/22      PT LONG TERM GOAL #2   Title Pt will improve BERG by at least 3 points in order to demonstrate clinically significant improvement in balance.    Baseline 10/21/21: 44/56; 1/17: 41/32  Time 12    Period Weeks    Status Achieved    Target Date 01/13/22      PT LONG TERM GOAL #3   Title Pt will decrease 5TSTS by at least 3 seconds in order to demonstrate clinically significant improvement in LE strength.    Baseline 10/21/21: 16.3 sec without UE support; 1/17: 13 sec hands-free    Time 12    Period Weeks    Status Achieved    Target Date 01/13/22      PT LONG TERM GOAL #4   Title Pt will improve 10MWT gait speed to >1.0 m/s with least restrictive AD to show clinically significant improvement and decrease risk of falls.    Baseline 10/21/21: 0.82 m/s with 2WW and CGA; 1/17 1 m/s without AD    Time 12    Period Weeks    Status Partially Met    Target Date 01/13/22      PT LONG TERM GOAL #5   Title Pt will increase 6MWT by at least 27m(164f in order to demonstrate clinically significant improvement in cardiopulmonary endurance and community ambulation    Baseline 10/21/21: deferred to next visit; 10/27/2021= 790 feet with front wheeled walker; 1/17: 1065 ft no AD    Time 12    Period Weeks    Status Achieved    Target Date 01/13/22      Additional Long Term Goals   Additional Long Term Goals Yes      PT LONG TERM GOAL #6   Title Pt will report ability to ambulate without her RW outside for at least 30 minutes and without steadiness in order to return patient to PLOF    Baseline 1/17: new    Time 12     Period Weeks    Status New    Target Date 02/23/22      PT LONG TERM GOAL #7   Title Pt will increase 6MWT by at least 200 ft in order to demonstrate clinically significant improvement in cardiopulmonary endurance and community ambulation    Baseline 1/17: 1065 ft (achieved previous 6MWT goal, can now be progressed further toward PLOF)    Time 12    Period Weeks    Status New    Target Date 02/23/22                   Plan - 12/16/21 1726     Clinical Impression Statement Pt demonstrates continued agitation throughout session, which at times limits pt's participation in completing certain exercises/reps. PT has recommended to pt and her caregiver (SIL) that pt discuss her feelings of agitation at her upcoming neurologist appointment. Pt's SIL verbalizes understanding. Pt otherwise tolerated interventions well without reports of pain. The pt will continue to benefit from further skilled PT to continue to improve strength and balance to return pt to PLOF.    Personal Factors and Comorbidities Age;Comorbidity 1;Sex;Time since onset of injury/illness/exacerbation;Fitness;Social Background    Comorbidities HLD    Examination-Activity Limitations Bathing;Bed Mobility;Caring for Others;Reach Overhead;Hygiene/Grooming;Self Feeding;Squat;Stairs;Transfers;Toileting;Sleep;Locomotion Level;Lift;Stand    Examination-Participation Restrictions Cleaning;Community Activity;Shop;Laundry;Meal Prep;Volunteer;Driving;Interpersonal Relationship    Stability/Clinical Decision Making Stable/Uncomplicated    Rehab Potential Good    PT Frequency 2x / week    PT Duration 12 weeks    PT Treatment/Interventions ADLs/Self Care Home Management;Cryotherapy;Electrical Stimulation;Moist Heat;Ultrasound;Gait training;Stair training;DME Instruction;Functional mobility training;Therapeutic activities;Therapeutic exercise;Balance training;Neuromuscular re-education;Patient/family education;Manual techniques;Energy  conservation;Passive range of motion;Joint Manipulations;Splinting;Taping;Vestibular;Visual/perceptual remediation/compensation;Wheelchair mobility training;Canalith Repostioning    PT Next Visit Plan  Progress dynamic balance, strengthening as pt is able, continue to monitor BP, progress endurance, continue POC as previously indicated    PT Home Exercise Plan 10/27/2021=Access Code: [email or web address]; 1/17: Education on progressing home walking program. PT recommends pt increase walking duration by 5 minutes this week.    Consulted and Agree with Plan of Care Patient             Patient will benefit from skilled therapeutic intervention in order to improve the following deficits and impairments:  Abnormal gait, Decreased activity tolerance, Decreased endurance, Decreased knowledge of use of DME, Decreased strength, Impaired sensation, Hypomobility, Decreased range of motion, Impaired UE functional use, Improper body mechanics, Pain, Decreased balance, Decreased coordination, Decreased mobility, Decreased safety awareness, Difficulty walking, Impaired flexibility, Postural dysfunction  Visit Diagnosis: Muscle weakness (generalized)  Unsteadiness on feet     Problem List Patient Active Problem List   Diagnosis Date Noted   History of stroke 11/13/2021   Essential hypertension 11/13/2021   DVT (deep venous thrombosis) ([place]) 11/13/2021   Adnexal mass 11/13/2021   Warm autoimmune hemolytic anemia (HCC)    Hemolytic anemia ([place]) 11/12/2021   HTN (hypertension) 10/19/2021   Constipation 10/19/2021   Elevated cholesterol 10/15/2021   Acute renal injury ([place]) 10/15/2021   Acute ischemic left ACA stroke ([place]) 10/07/2021   Stroke (cerebrum) ([place]) 10/01/2021    Zollie Pee, PT 12/16/2021, 5:32 PM  [place] MAIN [facility] SERVICES [address] [place], [place], [address] Phone: [phone]   Fax:  [phone]  Name: GENEVA BARRERO MRN: [id number] Date of  Birth: 25-Mar-1951

## 2021-12-17 NOTE — Therapy (Signed)
[place] MAIN [facility] SERVICES [address] [place], [place], [address] Phone: [phone]   Fax:  [phone]  Patient Details  Name: Andrea Williamson MRN: [id number] Date of Birth: [birth year] Referring Provider:  No ref. provider found  Encounter Date: 12/17/2021  PT reached out to pt's neurologist over Epic secure chat following her appointment on 12/16/2021 regarding pt behavioral change/onset of agitation that was not present during sessions prior to recent hospitalization on 11/12/22-11/13/22.   Zollie Pee, PT 12/17/2021, 4:25 PM  [place] MAIN [facility] SERVICES [address] [place], [place], [address] Phone: [phone]   Fax:  [phone]

## 2021-12-22 ENCOUNTER — Other Ambulatory Visit: Payer: Self-pay

## 2021-12-22 ENCOUNTER — Encounter: Payer: Self-pay | Admitting: Internal Medicine

## 2021-12-22 ENCOUNTER — Ambulatory Visit (INDEPENDENT_AMBULATORY_CARE_PROVIDER_SITE_OTHER): Payer: Medicare Other | Admitting: Internal Medicine

## 2021-12-22 VITALS — BP 155/97 | HR 71 | Ht 62.0 in | Wt 158.3 lb

## 2021-12-22 DIAGNOSIS — I63322 Cerebral infarction due to thrombosis of left anterior cerebral artery: Secondary | ICD-10-CM | POA: Diagnosis not present

## 2021-12-22 DIAGNOSIS — D5911 Warm autoimmune hemolytic anemia: Secondary | ICD-10-CM | POA: Diagnosis not present

## 2021-12-22 DIAGNOSIS — E7800 Pure hypercholesterolemia, unspecified: Secondary | ICD-10-CM | POA: Diagnosis not present

## 2021-12-22 DIAGNOSIS — N9489 Other specified conditions associated with female genital organs and menstrual cycle: Secondary | ICD-10-CM

## 2021-12-22 DIAGNOSIS — I10 Essential (primary) hypertension: Secondary | ICD-10-CM | POA: Diagnosis not present

## 2021-12-22 MED ORDER — TICAGRELOR 60 MG PO TABS: 60.0000 mg | ORAL_TABLET | Freq: Two times a day (BID) | ORAL | 0 refills | Status: DC

## 2021-12-22 MED ORDER — ALPRAZOLAM 0.25 MG PO TABS: 0.2500 mg | ORAL_TABLET | Freq: Every evening | ORAL | 0 refills | Status: DC | PRN

## 2021-12-22 NOTE — Progress Notes (Signed)
Established Patient Office Visit  Subjective:  Patient ID: Andrea Williamson, female    DOB: 01/25/1951  Age: 71 y.o. MRN: [id number]  CC:  Chief Complaint  Patient presents with   Follow-up    Patient is here for 1 month follow up, post stroke.    HPI  Andrea Williamson presents for follow up on , cva , she is walking, memory is intact  Past Medical History:  Diagnosis Date   Deep vein thrombosis (DVT) ([place])    Hypertension    Stroke [facility])     Past Surgical History:  Procedure Laterality Date   ANKLE SURGERY Left 2008   IR CT HEAD LTD  10/05/2021   IR INTRA CRAN STENT  10/01/2021   IR PERCUTANEOUS ART THROMBECTOMY/INFUSION INTRACRANIAL INC DIAG ANGIO  10/01/2021   IR US GUIDE VASC ACCESS RIGHT  10/01/2021   RADIOLOGY WITH ANESTHESIA N/A 10/01/2021   Procedure: IR WITH ANESTHESIA;  Surgeon: Radiologist, Medication, MD;  Location: [place];  Service: Radiology;  Laterality: N/A;    Family History  Problem Relation Age of Onset   COPD Mother    Cancer Mother     Social History   Socioeconomic History   Marital status: Married    Spouse name: Not on file   Number of children: 3   Years of education: 12   Highest education level: 12th grade  Occupational History   Occupation: Retired  Tobacco Use   Smoking status: Former    Packs/day: 1.50    Years: 50.00    Pack years: 75.00    Types: Cigarettes    Quit date: 09/2021    Years since quitting: 0.2   Smokeless tobacco: Never  Vaping Use   Vaping Use: Never used  Substance and Sexual Activity   Alcohol use: Not Currently   Drug use: Not Currently   Sexual activity: Not Currently  Other Topics Concern   Not on file  Social History Narrative   October 2022 stopped   Social Determinants of Health   Financial Resource Strain: Low Risk    Difficulty of Paying Living Expenses: Not hard at all  Food Insecurity: No Food Insecurity   Worried About Charity fundraiser in the Last Year: Never true   Arboriculturist in the  Last Year: Never true  Transportation Needs: No Transportation Needs   Lack of Transportation (Medical): No   Lack of Transportation (Non-Medical): No  Physical Activity: Insufficiently Active   Days of Exercise per Week: 2 days   Minutes of Exercise per Session: 60 min  Stress: Stress Concern Present   Feeling of Stress : To some extent  Social Connections: Moderately Isolated   Frequency of Communication with Friends and Family: More than three times a week   Frequency of Social Gatherings with Friends and Family: More than three times a week   Attends Religious Services: Never   Marine scientist or Organizations: No   Attends Music therapist: Never   Marital Status: Married  Human resources officer Violence: Not At Risk   Fear of Current or Ex-Partner: No   Emotionally Abused: No   Physically Abused: No   Sexually Abused: No     Current Outpatient Medications:    acetaminophen (TYLENOL) 325 MG tablet, Take 1-2 tablets (325-650 mg total) by mouth every 4 (four) hours as needed for mild pain., Disp: , Rfl:    ALPRAZolam (XANAX) 0.25 MG tablet, Take 1 tablet (0.25  mg total) by mouth at bedtime as needed for anxiety. Take 1/2 tablet at bedtime for difficulty sleeping., Disp: 15 tablet, Rfl: 0   amLODipine (NORVASC) 5 MG tablet, Take 1 tablet (5 mg total) by mouth daily., Disp: 90 tablet, Rfl: 1   apixaban (ELIQUIS) 5 MG TABS tablet, Take 1 tablet (5 mg total) by mouth 2 (two) times daily., Disp: 60 tablet, Rfl: 1   atorvastatin (LIPITOR) 80 MG tablet, Take 1 tablet (80 mg total) by mouth daily., Disp: 90 tablet, Rfl: 1   ferrous sulfate 325 (65 FE) MG tablet, Take 325 mg by mouth daily with breakfast., Disp: , Rfl:    folic acid (FOLVITE) 1 MG tablet, Take 1 tablet (1 mg total) by mouth daily., Disp: , Rfl:    hydrALAZINE (APRESOLINE) 25 MG tablet, Take 1 tablet (25 mg total) by mouth every 8 (eight) hours as needed (SBP >170 or DBP >110)., Disp: , Rfl:    magnesium  gluconate (MAGONATE) 500 MG tablet, Take 0.5 tablets (250 mg total) by mouth at bedtime., Disp: 30 tablet, Rfl: 0   ondansetron (ZOFRAN-ODT) 4 MG disintegrating tablet, Take 1 tablet (4 mg total) by mouth once as needed for nausea., Disp: 20 tablet, Rfl: 0   pantoprazole (PROTONIX) 40 MG tablet, Take 1 tablet (40 mg total) by mouth daily., Disp: 30 tablet, Rfl: 1   predniSONE (DELTASONE) 10 MG tablet, Take 1 tablet (10 mg total) by mouth daily with breakfast. 7 pills daily x 4 days, then 6 pills daily x 4 days, 5 pills daily x4 days, 4 pills x 4 days, 3 pills x 4 days, 2 pills x 4 days, then 1 pill x 4 days and then stop, Disp: 112 tablet, Rfl: 0   ticagrelor (BRILINTA) 60 MG TABS tablet, Take 1 tablet (60 mg total) by mouth 2 (two) times daily., Disp: 180 tablet, Rfl: 0   No Known Allergies  ROS Review of Systems  Constitutional: Negative.   HENT: Negative.    Eyes: Negative.   Respiratory: Negative.    Cardiovascular: Negative.   Gastrointestinal: Negative.   Endocrine: Negative.   Genitourinary: Negative.   Musculoskeletal: Negative.   Skin: Negative.   Allergic/Immunologic: Negative.   Neurological: Negative.   Hematological: Negative.   Psychiatric/Behavioral: Negative.    All other systems reviewed and are negative.    Objective:    Physical Exam Vitals reviewed.  Constitutional:      Appearance: Normal appearance.  HENT:     Mouth/Throat:     Mouth: Mucous membranes are moist.  Eyes:     Pupils: Pupils are equal, round, and reactive to light.  Neck:     Vascular: No carotid bruit.  Cardiovascular:     Rate and Rhythm: Normal rate and regular rhythm.     Pulses: Normal pulses.     Heart sounds: Normal heart sounds.  Pulmonary:     Effort: Pulmonary effort is normal.     Breath sounds: Normal breath sounds.  Abdominal:     General: Bowel sounds are normal.     Palpations: Abdomen is soft. There is no hepatomegaly, splenomegaly or mass.     Tenderness: There is  no abdominal tenderness.     Hernia: No hernia is present.  Musculoskeletal:        General: No tenderness.     Cervical back: Neck supple.     Right lower leg: No edema.     Left lower leg: No edema.  Skin:  Findings: No rash.  Neurological:     Mental Status: She is alert and oriented to person, place, and time.     Motor: No weakness.  Psychiatric:        Mood and Affect: Mood and affect normal.        Behavior: Behavior normal.    BP (!) 155/97    Pulse 71    Ht 5\' 2"  (1.575 m)    Wt 158 lb 4.8 oz (71.8 kg)    BMI 28.95 kg/m  Wt Readings from Last 3 Encounters:  12/22/21 158 lb 4.8 oz (71.8 kg)  12/09/21 156 lb (70.8 kg)  11/30/21 155 lb (70.3 kg)     Health Maintenance Due  Topic Date Due   COVID-19 Vaccine (1) Never done   TETANUS/TDAP  Never done   COLONOSCOPY (Pts 45-44yrs Insurance coverage will need to be confirmed)  Never done   MAMMOGRAM  Never done   Zoster Vaccines- Shingrix (1 of 2) Never done   Pneumonia Vaccine 65+ Years old (1 - PCV) Never done   DEXA SCAN  Never done   INFLUENZA VACCINE  Never done    There are no preventive care reminders to display for this patient.  No results found for: TSH Lab Results  Component Value Date   WBC 9.1 12/09/2021   HGB 10.7 (L) 12/09/2021   HCT 33.1 (L) 12/09/2021   MCV 122.1 (H) 12/09/2021   PLT 278 12/09/2021   Lab Results  Component Value Date   NA 138 12/09/2021   K 3.8 12/09/2021   CO2 27 12/09/2021   GLUCOSE 123 (H) 12/09/2021   BUN 41 (H) 12/09/2021   CREATININE 1.13 (H) 12/09/2021   BILITOT 1.1 12/09/2021   ALKPHOS 58 12/09/2021   AST 22 12/09/2021   ALT 39 12/09/2021   PROT 6.4 (L) 12/09/2021   ALBUMIN 4.1 12/09/2021   CALCIUM 9.5 12/09/2021   ANIONGAP 8 12/09/2021   Lab Results  Component Value Date   CHOL 317 (H) 10/02/2021   Lab Results  Component Value Date   HDL 49 10/02/2021   Lab Results  Component Value Date   LDLCALC 232 (H) 10/02/2021   Lab Results  Component  Value Date   TRIG 182 (H) 10/02/2021   Lab Results  Component Value Date   CHOLHDL 6.5 10/02/2021   Lab Results  Component Value Date   HGBA1C 4.5 (L) 11/13/2021      Assessment & Plan:   Problem List Items Addressed This Visit       Cardiovascular and Mediastinum   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  Patient will be follow-up in 3 months  or earlier as needed, patient will call me back for any change in the cardiovascular symptoms Patient was advised to buy a book from local bookstore concerning blood pressure and read several chapters  every day.  This will be supplemented by some of the material we will give him from the office.  Patient should also utilize other resources like YouTube and  Internet to learn more about the blood pressure and the diet.        Other   Elevated cholesterol    Hypercholesterolemia  I advised the patient to follow Mediterranean diet This diet is rich in fruits vegetables and whole grain, and This diet is also rich in fish and lean meat Patient should also eat a handful of almonds or walnuts daily Recent heart study indicated that average follow-up on this kind of diet reduces the cardiovascular mortality by 50 to 70%==      Warm autoimmune hemolytic anemia (HCC)   Adnexal mass    Meds ordered this encounter  Medications   ALPRAZolam (XANAX) 0.25 MG tablet    Sig: Take 1 tablet (0.25 mg total) by mouth at bedtime as needed for anxiety. Take 1/2 tablet at bedtime for difficulty sleeping.    Dispense:  15 tablet    Refill:  0   ticagrelor (BRILINTA)  60 MG TABS tablet    Sig: Take 1 tablet (60 mg total) by mouth 2 (two) times daily.    Dispense:  180 tablet    Refill:  0    Future refills will come from neurology    Follow-up: No follow-ups on file.    Cletis Athens, MD

## 2021-12-22 NOTE — Assessment & Plan Note (Signed)
Stable at the present time. 

## 2021-12-22 NOTE — Assessment & Plan Note (Signed)
Hypercholesterolemia  I advised the patient to follow Mediterranean diet This diet is rich in fruits vegetables and whole grain, and This diet is also rich in fish and lean meat Patient should also eat a handful of almonds or walnuts daily Recent heart study indicated that average follow-up on this kind of diet reduces the cardiovascular mortality by 50 to 70%== 

## 2021-12-22 NOTE — Assessment & Plan Note (Signed)

## 2021-12-23 ENCOUNTER — Inpatient Hospital Stay: Payer: Medicare Other | Attending: Oncology

## 2021-12-23 ENCOUNTER — Encounter
Payer: Medicare Other | Attending: Physical Medicine and Rehabilitation | Admitting: Physical Medicine and Rehabilitation

## 2021-12-23 ENCOUNTER — Encounter: Payer: Self-pay | Admitting: Physical Medicine and Rehabilitation

## 2021-12-23 VITALS — BP 142/82 | HR 76 | Ht 62.0 in | Wt 159.0 lb

## 2021-12-23 DIAGNOSIS — Z7901 Long term (current) use of anticoagulants: Secondary | ICD-10-CM | POA: Diagnosis not present

## 2021-12-23 DIAGNOSIS — I10 Essential (primary) hypertension: Secondary | ICD-10-CM | POA: Insufficient documentation

## 2021-12-23 DIAGNOSIS — D5919 Other autoimmune hemolytic anemia: Secondary | ICD-10-CM

## 2021-12-23 DIAGNOSIS — D251 Intramural leiomyoma of uterus: Secondary | ICD-10-CM | POA: Diagnosis not present

## 2021-12-23 DIAGNOSIS — Z79899 Other long term (current) drug therapy: Secondary | ICD-10-CM | POA: Insufficient documentation

## 2021-12-23 DIAGNOSIS — R531 Weakness: Secondary | ICD-10-CM | POA: Diagnosis not present

## 2021-12-23 DIAGNOSIS — Z87891 Personal history of nicotine dependence: Secondary | ICD-10-CM | POA: Diagnosis not present

## 2021-12-23 DIAGNOSIS — R42 Dizziness and giddiness: Secondary | ICD-10-CM | POA: Insufficient documentation

## 2021-12-23 DIAGNOSIS — R110 Nausea: Secondary | ICD-10-CM | POA: Diagnosis not present

## 2021-12-23 DIAGNOSIS — I63322 Cerebral infarction due to thrombosis of left anterior cerebral artery: Secondary | ICD-10-CM | POA: Diagnosis not present

## 2021-12-23 DIAGNOSIS — Z8673 Personal history of transient ischemic attack (TIA), and cerebral infarction without residual deficits: Secondary | ICD-10-CM | POA: Insufficient documentation

## 2021-12-23 DIAGNOSIS — R251 Tremor, unspecified: Secondary | ICD-10-CM | POA: Insufficient documentation

## 2021-12-23 DIAGNOSIS — R1900 Intra-abdominal and pelvic swelling, mass and lump, unspecified site: Secondary | ICD-10-CM | POA: Insufficient documentation

## 2021-12-23 DIAGNOSIS — Z86718 Personal history of other venous thrombosis and embolism: Secondary | ICD-10-CM | POA: Insufficient documentation

## 2021-12-23 LAB — COMPREHENSIVE METABOLIC PANEL
ALT: 42 U/L (ref 0–44)
AST: 20 U/L (ref 15–41)
Albumin: 4.1 g/dL (ref 3.5–5.0)
Alkaline Phosphatase: 47 U/L (ref 38–126)
Anion gap: 8 (ref 5–15)
BUN: 38 mg/dL — ABNORMAL HIGH (ref 8–23)
CO2: 26 mmol/L (ref 22–32)
Calcium: 9.6 mg/dL (ref 8.9–10.3)
Chloride: 105 mmol/L (ref 98–111)
Creatinine, Ser: 1.19 mg/dL — ABNORMAL HIGH (ref 0.44–1.00)
GFR, Estimated: 49 mL/min — ABNORMAL LOW (ref >60)
Glucose, Bld: 101 mg/dL — ABNORMAL HIGH (ref 70–99)
Potassium: 4.5 mmol/L (ref 3.5–5.1)
Sodium: 139 mmol/L (ref 135–145)
Total Bilirubin: 0.9 mg/dL (ref 0.3–1.2)
Total Protein: 6.6 g/dL (ref 6.5–8.1)

## 2021-12-23 LAB — RETIC PANEL
Immature Retic Fract: 20.6 % — ABNORMAL HIGH (ref 2.3–15.9)
RBC.: 3.09 MIL/uL — ABNORMAL LOW (ref 3.87–5.11)
Retic Count, Absolute: 120.2 10*3/uL (ref 19.0–186.0)
Retic Ct Pct: 3.9 % — ABNORMAL HIGH (ref 0.4–3.1)
Reticulocyte Hemoglobin: 37.1 pg (ref >27.9)

## 2021-12-23 LAB — LACTATE DEHYDROGENASE: LDH: 251 U/L — ABNORMAL HIGH (ref 98–192)

## 2021-12-23 LAB — CBC WITH DIFFERENTIAL/PLATELET
Abs Immature Granulocytes: 0.87 10*3/uL — ABNORMAL HIGH (ref 0.00–0.07)
Basophils Absolute: 0.1 10*3/uL (ref 0.0–0.1)
Basophils Relative: 0 %
Eosinophils Absolute: 0 10*3/uL (ref 0.0–0.5)
Eosinophils Relative: 0 %
HCT: 35.7 % — ABNORMAL LOW (ref 36.0–46.0)
Hemoglobin: 11.7 g/dL — ABNORMAL LOW (ref 12.0–15.0)
Immature Granulocytes: 4 %
Lymphocytes Relative: 4 %
Lymphs Abs: 0.7 10*3/uL (ref 0.7–4.0)
MCH: 38.1 pg — ABNORMAL HIGH (ref 26.0–34.0)
MCHC: 32.8 g/dL (ref 30.0–36.0)
MCV: 116.3 fL — ABNORMAL HIGH (ref 80.0–100.0)
Monocytes Absolute: 1 10*3/uL (ref 0.1–1.0)
Monocytes Relative: 5 %
Neutro Abs: 17.2 10*3/uL — ABNORMAL HIGH (ref 1.7–7.7)
Neutrophils Relative %: 87 %
Platelets: 259 10*3/uL (ref 150–400)
RBC: 3.07 MIL/uL — ABNORMAL LOW (ref 3.87–5.11)
RDW: 13.2 % (ref 11.5–15.5)
WBC: 19.9 10*3/uL — ABNORMAL HIGH (ref 4.0–10.5)
nRBC: 0 % (ref 0.0–0.2)

## 2021-12-23 LAB — RETICULOCYTES
Immature Retic Fract: 21.2 % — ABNORMAL HIGH (ref 2.3–15.9)
RBC.: 3.01 MIL/uL — ABNORMAL LOW (ref 3.87–5.11)
Retic Count, Absolute: 116.5 K/uL (ref 19.0–186.0)
Retic Ct Pct: 3.9 % — ABNORMAL HIGH (ref 0.4–3.1)

## 2021-12-23 NOTE — Progress Notes (Signed)
Subjective:    Patient ID: Andrea Williamson, female    DOB: 1951-08-21, 71 y.o.   MRN: [id number]  HPI Pt is a 71 yr old female with L ASA A2 infarct-  on Brillinta and Eliquis and ASA- also had azotemia in hospital and BMI of 29 as well as HTN-  Also had B/L LE DVT- so on Eliquis and Hemolytic anemia- on Rituxin- Here for hospital f/u on stroke.   No issues Doing fine.  Sees Gyn on Friday.   No pain.  Thought process/cognition never affected.        Pain Inventory Average Pain 0 Pain Right Now 0 My pain is  No Pain  LOCATION OF PAIN  No Pain  BOWEL Number of stools per week: 7  BLADDER Normal    Mobility walk without assistance ability to climb steps?  yes do you drive?  no  Function retired  Neuro/Psych No problems in this area  Prior Studies Any changes since last visit?  no  Physicians involved in your care Any changes since last visit?  no   Family History  Problem Relation Age of Onset   COPD Mother    Cancer Mother    Social History   Socioeconomic History   Marital status: Married    Spouse name: Not on file   Number of children: 3   Years of education: 12   Highest education level: 12th grade  Occupational History   Occupation: Retired  Tobacco Use   Smoking status: Former    Packs/day: 1.50    Years: 50.00    Pack years: 75.00    Types: Cigarettes    Quit date: 09/2021    Years since quitting: 0.2   Smokeless tobacco: Never  Vaping Use   Vaping Use: Never used  Substance and Sexual Activity   Alcohol use: Not Currently   Drug use: Not Currently   Sexual activity: Not Currently  Other Topics Concern   Not on file  Social History Narrative   October 2022 stopped   Social Determinants of Health   Financial Resource Strain: Low Risk    Difficulty of Paying Living Expenses: Not hard at all  Food Insecurity: No Food Insecurity   Worried About Charity fundraiser in the Last Year: Never true   Arboriculturist in the Last  Year: Never true  Transportation Needs: No Transportation Needs   Lack of Transportation (Medical): No   Lack of Transportation (Non-Medical): No  Physical Activity: Insufficiently Active   Days of Exercise per Week: 2 days   Minutes of Exercise per Session: 60 min  Stress: Stress Concern Present   Feeling of Stress : To some extent  Social Connections: Moderately Isolated   Frequency of Communication with Friends and Family: More than three times a week   Frequency of Social Gatherings with Friends and Family: More than three times a week   Attends Religious Services: Never   Marine scientist or Organizations: No   Attends Archivist Meetings: Never   Marital Status: Married   Past Surgical History:  Procedure Laterality Date   ANKLE SURGERY Left 2008   IR CT HEAD LTD  10/05/2021   IR INTRA CRAN STENT  10/01/2021   IR PERCUTANEOUS ART THROMBECTOMY/INFUSION INTRACRANIAL INC DIAG ANGIO  10/01/2021   IR US GUIDE VASC ACCESS RIGHT  10/01/2021   RADIOLOGY WITH ANESTHESIA N/A 10/01/2021   Procedure: IR WITH ANESTHESIA;  Surgeon: Radiologist, Medication,  MD;  Location: [place];  Service: Radiology;  Laterality: N/A;   Past Medical History:  Diagnosis Date   Deep vein thrombosis (DVT) ([place])    Hypertension    Stroke ([place])    There were no vitals taken for this visit.  Opioid Risk Score:   Fall Risk Score:  `1  Depression screen PHQ 2/9  Depression screen PHQ 2/9 11/26/2021  Decreased Interest 1  Down, Depressed, Hopeless 0  PHQ - 2 Score 1    Review of Systems  All other systems reviewed and are negative.     Objective:   Physical Exam  Awake, alert, appropriate, HOH?; Daughter with her, no Assistive device, NAD Neuro: Tremor.myoclonus? with intention-little shakier- since before stroke, per daughter. Goes way at rest.  Sensation intact to light touch in all 4 extremities 2 beats conus in RLE and Hoffman's on R- no increased tone MS: 5/5 in Ue's  deltoids, biceps, triceps, WE, grip and FA B/L  LE's  5/5 in HF/KE/KF/DF and PF B/L         Assessment & Plan:  Pt is a 71 yr old female with L ASA A2 infarct-  on Brillinta and ASA- also had azotemia in hospital and BMI of 29 as well as HTN-  Here for hospital f/u on stroke.  Also has tremor/myoclonus  Strength has completely resolved-  2.  Speak to Neurology due to tremor- they might be able to treat? Tremor vs myoclonus? Also see at rest intermittently in appt, but worse with intention.   3. Tiny bit of spasticity- most of spasticity will be determined by 6 months after stroke- if hard to move- like stiff/decreased range of motion, or "moving through molasses" or foot goes "boing" then cna treat- but wouldn't start on meds at this time.  Educated on spasticity. Symptoms so mild, it bodes well right now.   4. Call me if this happens. And can work you in to try and treat.   5. F/U as needed  I spent a total of  20  minutes on total care today- >50% coordination of care- due to education on spasticity

## 2021-12-23 NOTE — Patient Instructions (Signed)
Pt is a 71 yr old female with L ASA A2 infarct-  on Brillinta and ASA- also had azotemia in hospital and BMI of 29 as well as HTN-  Here for hospital f/u on stroke.  Also has tremor/myoclonus  Strength has completely resolved-  2.  Speak to Neurology due to tremor- they might be able to treat? Tremor vs myoclonus? Also see at rest intermittently in appt, but worse with intention.   3. Tiny bit of spasticity- most of spasticity will be determined by 6 months after stroke- if hard to move- like stiff/decreased range of motion, or "moving through molasses" or foot goes "boing" then cna treat- but wouldn't start on meds at this time.  Educated on spasticity. Symptoms so mild, it bodes well right now.   4. Call me if this happens. And can work you in to try and treat.   5. F/U as needed

## 2021-12-24 ENCOUNTER — Ambulatory Visit: Payer: Medicare Other

## 2021-12-24 ENCOUNTER — Other Ambulatory Visit: Payer: Self-pay

## 2021-12-24 DIAGNOSIS — R2689 Other abnormalities of gait and mobility: Secondary | ICD-10-CM | POA: Diagnosis not present

## 2021-12-24 DIAGNOSIS — M6281 Muscle weakness (generalized): Secondary | ICD-10-CM

## 2021-12-24 DIAGNOSIS — I639 Cerebral infarction, unspecified: Secondary | ICD-10-CM | POA: Diagnosis not present

## 2021-12-24 DIAGNOSIS — R2681 Unsteadiness on feet: Secondary | ICD-10-CM

## 2021-12-24 DIAGNOSIS — R278 Other lack of coordination: Secondary | ICD-10-CM | POA: Diagnosis not present

## 2021-12-24 DIAGNOSIS — R262 Difficulty in walking, not elsewhere classified: Secondary | ICD-10-CM | POA: Diagnosis not present

## 2021-12-24 DIAGNOSIS — R269 Unspecified abnormalities of gait and mobility: Secondary | ICD-10-CM | POA: Diagnosis not present

## 2021-12-24 LAB — HAPTOGLOBIN: Haptoglobin: 83 mg/dL (ref 37–355)

## 2021-12-24 NOTE — Therapy (Signed)
[place] MAIN [facility] SERVICES [address] [place], [place], [address] Phone: [phone]   Fax:  [phone]  Physical Therapy Treatment  Patient Details  Name: Andrea Williamson MRN: [id number] Date of Birth: [birth year] Referring Provider (PT): Reesa Chew, [place]   Encounter Date: 12/24/2021   PT End of Session - 12/24/21 1836     Visit Number 14    Number of Visits 25    Date for PT Re-Evaluation 01/13/22    Authorization Time Period Eval performed: 10/21/21    Progress Note Due on Visit 10    PT Start Time 0811    PT Stop Time 0852    PT Time Calculation (min) 41 min    Equipment Utilized During Treatment Gait belt    Activity Tolerance Patient tolerated treatment well    Behavior During Therapy [facility] for tasks assessed/performed             Past Medical History:  Diagnosis Date   Deep vein thrombosis (DVT) ([place])    Hypertension    Stroke [facility])     Past Surgical History:  Procedure Laterality Date   ANKLE SURGERY Left 2008   IR CT HEAD LTD  10/05/2021   IR INTRA CRAN STENT  10/01/2021   IR PERCUTANEOUS ART THROMBECTOMY/INFUSION INTRACRANIAL INC DIAG ANGIO  10/01/2021   IR US GUIDE VASC ACCESS RIGHT  10/01/2021   RADIOLOGY WITH ANESTHESIA N/A 10/01/2021   Procedure: IR WITH ANESTHESIA;  Surgeon: Radiologist, Medication, MD;  Location: [place];  Service: Radiology;  Laterality: N/A;    There were no vitals filed for this visit.   Subjective Assessment - 12/24/21 1840     Subjective Pt reports she had a good appointment with her neurologist. She reports strength improvement but some remaining spasticity. Pt reports she has started to jog some while on her walks. She reports no LOB with walking or jogging/no falls.    Patient is accompained by: Family member    Pertinent History Pt is a 71 y/o female presenting with R LE strength and balance deficits. Pt is s/p L hemisphere CVA, admitted to the ED 10/01/21. Imaging included MRI scans  on 10/02/21 yielding "Redemonstrated infarcts in the left ACA and ACA/PCA watershed  territory, with one new area of restricted diffusion in the medial right parietal lobe, likely an additional acute infarct, status post  A2 stenting. A small amount of associated petechial hemorrhage is  noted within the medial left frontal lobe, without significant  hemorrhagic transformation." per Dr. Merilyn Baba, MD. Pt has PMH that includes HLD.    Limitations Walking;Standing    How long can you sit comfortably? "a long time"    How long can you stand comfortably? 30 minutes to an hour    How long can you walk comfortably? 7 minutes    Diagnostic tests CT (10/01/21) - 1. The right common femoral artery has normal caliber, adequate for   vascular access.   2. 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   3. Intracranial atherosclerotic disease with multifocal areas of   mild stenosis in the left ACA and MCA vascular trees. MRI HEAD WITHOUT CONTRAST (10/12/21) - "Redemonstrated infarcts in the left ACA and ACA/PCA watershed  territory, with one new area of restricted diffusion in the medial right parietal lobe, likely an additional acute infarct, status post  A2 stenting. A small amount of associated petechial hemorrhage is  noted within the medial left frontal lobe, without  significant  hemorrhagic transformation."    Patient Stated Goals wants to be able to go back to bowling in january, taking care of her dog and cat    Currently in Pain? No/denies               INTERVENTIONS    Firm surface: in // bars with gait belt donned and CGA provided throughout unless otherwise specified. Tandem - 2x30 sec each LE NBOS EO - x 30 sec, pt steady NBOS EC - 2x30 sec, minimal increase in sway NBOS EC on airex - 4x30 sec, more challenging, intermittent UE support and min assist NBOS EO on airex - x30 sec, steady Semi tandem on airex - 2x30 sec on each LE, more challenging, increased sway, intermittent UE  support Tandem stance on airex - 2x30 sec each LE, up to min a to maintain balance, continued use of UE support  Dynamic balance interventions performed over 10 meters walkway, with CGA provided unless otherwise specified- Tandem walking 2x Gait with vertical head turns 2x Gait with horizontal head turns 2x Pt most challenged with vertical head turns. No LOB, however.  Obstacle course: navigating around cones, stepping over obstacles and onto and off of a compliant surface 4x through. Pt exhibits a few errors, errors increase when pt attempts to perform without stopping/decreasing speed.   In // bars, CGA unless otherwise specified: Basketball for dynamic balance task, pt static standing on airex pad in NBOS, semi-tandem each LE, and tandem each LE. Pt requires intermittent UE support while in semi-tandem, and intermittent UE support and min a while in tandem stance to maintain balance. Pt performs for several minutes.   Bowling in // bars - cones set up as pins x 3 min. CGA provided throughout  Bowling over 8 meter track - x 5 min. Pt uses multiple attempts and eventual knocks over all cones. Pt exhibits 2 instance of step strategy where pt was able to steady herself independently, no LOB, no gross unteadiness noted overall. CGA provided throughout.   Instructed pt in return to bowling plan- start slowly, use light weight ball and work way up as tolerated, have someone next to you/guard you while you start. Pt and her SIL (caregiver) verbalize understanding.     Pt educated throughout session about proper posture and technique with exercises. Improved exercise technique, movement at target joints, use of target muscles after min to mod verbal, visual, tactile cues.      PT Education - 12/24/21 1835     Education Details exercise technique, body mechanics    Person(s) Educated Patient    Methods Explanation;Demonstration;Tactile cues;Verbal cues    Comprehension Verbalized  understanding;Returned demonstration;Verbal cues required;Need further instruction              PT Short Term Goals - 12/01/21 0939       PT SHORT TERM GOAL #1   Title Pt will be independent with HEP in order to improve strength and balance in order to decrease fall risk and improve function at home and work.    Baseline 10/21/21: HEP deferred to next visit; 1/17: Pt performing progressive walking program, pt to increase time ambulated by 5 minutes    Time 6    Period Weeks    Status On-going    Target Date 01/12/22               PT Long Term Goals - 12/01/21 0939       PT LONG TERM GOAL #1  Title Pt will improve FOTO score to >71% to show improved functional mobility with ADLs    Baseline 10/21/21: 58%; 12/01/2021: 68%    Time 12    Period Weeks    Status Partially Met    Target Date 01/13/22      PT LONG TERM GOAL #2   Title Pt will improve BERG by at least 3 points in order to demonstrate clinically significant improvement in balance.    Baseline 10/21/21: 44/56; 1/17: 54/56    Time 12    Period Weeks    Status Achieved    Target Date 01/13/22      PT LONG TERM GOAL #3   Title Pt will decrease 5TSTS by at least 3 seconds in order to demonstrate clinically significant improvement in LE strength.    Baseline 10/21/21: 16.3 sec without UE support; 1/17: 13 sec hands-free    Time 12    Period Weeks    Status Achieved    Target Date 01/13/22      PT LONG TERM GOAL #4   Title Pt will improve 10MWT gait speed to >1.0 m/s with least restrictive AD to show clinically significant improvement and decrease risk of falls.    Baseline 10/21/21: 0.82 m/s with 2WW and CGA; 1/17 1 m/s without AD    Time 12    Period Weeks    Status Partially Met    Target Date 01/13/22      PT LONG TERM GOAL #5   Title Pt will increase 6MWT by at least 13m(1615f in order to demonstrate clinically significant improvement in cardiopulmonary endurance and community ambulation    Baseline  10/21/21: deferred to next visit; 10/27/2021= 790 feet with front wheeled walker; 1/17: 1065 ft no AD    Time 12    Period Weeks    Status Achieved    Target Date 01/13/22      Additional Long Term Goals   Additional Long Term Goals Yes      PT LONG TERM GOAL #6   Title Pt will report ability to ambulate without her RW outside for at least 30 minutes and without steadiness in order to return patient to PLOF    Baseline 1/17: new    Time 12    Period Weeks    Status New    Target Date 02/23/22      PT LONG TERM GOAL #7   Title Pt will increase 6MWT by at least 200 ft in order to demonstrate clinically significant improvement in cardiopulmonary endurance and community ambulation    Baseline 1/17: 1065 ft (achieved previous 6MWT goal, can now be progressed further toward PLOF)    Time 12    Period Weeks    Status New    Target Date 02/23/22                   Plan - 12/24/21 1837     Clinical Impression Statement Pt able to progress to more challenging balance interventions and exhibits improved dynamic balance and coordination. Pt also responds to interventions well with minimal to no agitation on this date. The pt will continue to benefit from further skilled PT to improve balance, strength and gait in order for pt to return to PLOF.    Personal Factors and Comorbidities Age;Comorbidity 1;Sex;Time since onset of injury/illness/exacerbation;Fitness;Social Background    Comorbidities HLD    Examination-Activity Limitations Bathing;Bed Mobility;Caring for Others;Reach Overhead;Hygiene/Grooming;Self Feeding;Squat;Stairs;Transfers;Toileting;Sleep;Locomotion Level;Lift;Stand    Examination-Participation Restrictions Cleaning;Community Activity;Shop;Laundry;Meal  Prep;Volunteer;Driving;Interpersonal Relationship    Stability/Clinical Decision Making Stable/Uncomplicated    Rehab Potential Good    PT Frequency 2x / week    PT Duration 12 weeks    PT Treatment/Interventions  ADLs/Self Care Home Management;Cryotherapy;Electrical Stimulation;Moist Heat;Ultrasound;Gait training;Stair training;DME Instruction;Functional mobility training;Therapeutic activities;Therapeutic exercise;Balance training;Neuromuscular re-education;Patient/family education;Manual techniques;Energy conservation;Passive range of motion;Joint Manipulations;Splinting;Taping;Vestibular;Visual/perceptual remediation/compensation;Wheelchair mobility training;Canalith Repostioning    PT Next Visit Plan Progress dynamic balance, strengthening as pt is able, continue to monitor BP, progress endurance, continue POC as previously indicated    PT Home Exercise Plan 10/27/2021=Access Code: [email or web address]; 1/17: Education on progressing home walking program. PT recommends pt increase walking duration by 5 minutes this week.; no updates    Consulted and Agree with Plan of Care Patient             Patient will benefit from skilled therapeutic intervention in order to improve the following deficits and impairments:  Abnormal gait, Decreased activity tolerance, Decreased endurance, Decreased knowledge of use of DME, Decreased strength, Impaired sensation, Hypomobility, Decreased range of motion, Impaired UE functional use, Improper body mechanics, Pain, Decreased balance, Decreased coordination, Decreased mobility, Decreased safety awareness, Difficulty walking, Impaired flexibility, Postural dysfunction  Visit Diagnosis: Unsteadiness on feet  Muscle weakness (generalized)  Other abnormalities of gait and mobility  Other lack of coordination     Problem List Patient Active Problem List   Diagnosis Date Noted   History of stroke 11/13/2021   Essential hypertension 11/13/2021   DVT (deep venous thrombosis) ([place]) 11/13/2021   Adnexal mass 11/13/2021   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 [place], [place], [address] Phone: [phone]   Fax:  [phone]  Name: Andrea Williamson MRN: [id number] Date of Birth: [birth year]

## 2021-12-25 ENCOUNTER — Inpatient Hospital Stay (HOSPITAL_BASED_OUTPATIENT_CLINIC_OR_DEPARTMENT_OTHER): Payer: Medicare Other | Admitting: Gynecologic Oncology

## 2021-12-25 VITALS — BP 147/65 | HR 81 | Temp 98.2°F | Resp 16 | Ht 62.01 in | Wt 161.6 lb

## 2021-12-25 DIAGNOSIS — R251 Tremor, unspecified: Secondary | ICD-10-CM | POA: Diagnosis not present

## 2021-12-25 DIAGNOSIS — R1900 Intra-abdominal and pelvic swelling, mass and lump, unspecified site: Secondary | ICD-10-CM

## 2021-12-25 DIAGNOSIS — Z87891 Personal history of nicotine dependence: Secondary | ICD-10-CM | POA: Diagnosis not present

## 2021-12-25 DIAGNOSIS — Z8673 Personal history of transient ischemic attack (TIA), and cerebral infarction without residual deficits: Secondary | ICD-10-CM

## 2021-12-25 DIAGNOSIS — I82403 Acute embolism and thrombosis of unspecified deep veins of lower extremity, bilateral: Secondary | ICD-10-CM

## 2021-12-25 DIAGNOSIS — Z86718 Personal history of other venous thrombosis and embolism: Secondary | ICD-10-CM

## 2021-12-25 DIAGNOSIS — I10 Essential (primary) hypertension: Secondary | ICD-10-CM | POA: Diagnosis not present

## 2021-12-25 DIAGNOSIS — N9489 Other specified conditions associated with female genital organs and menstrual cycle: Secondary | ICD-10-CM

## 2021-12-25 DIAGNOSIS — R42 Dizziness and giddiness: Secondary | ICD-10-CM | POA: Diagnosis not present

## 2021-12-25 DIAGNOSIS — Z79899 Other long term (current) drug therapy: Secondary | ICD-10-CM | POA: Diagnosis not present

## 2021-12-25 DIAGNOSIS — R110 Nausea: Secondary | ICD-10-CM | POA: Diagnosis not present

## 2021-12-25 DIAGNOSIS — Z7901 Long term (current) use of anticoagulants: Secondary | ICD-10-CM | POA: Diagnosis not present

## 2021-12-25 DIAGNOSIS — R531 Weakness: Secondary | ICD-10-CM | POA: Diagnosis not present

## 2021-12-25 NOTE — Patient Instructions (Signed)
It was good to see you today.  I am glad to see that you are recovering well.  I do not see or feel anything abnormal on your exam.  Given the known mass on her near your ovary, I do recommend close follow-up imaging.  We will plan to get a pelvic MRI in April and I will call you when your daughter-in-law for a phone visit once I have these results.  I will have my team reach out to your neurologist and medical oncologist in terms of when would be the soonest possible time where it would be safe to think about surgery if it looks like we need to proceed with surgery.

## 2021-12-25 NOTE — Progress Notes (Signed)
Gynecologic Oncology Return Clinic Visit  12/25/2021  Reason for Visit: Follow-up recent hospitalization and known complex adnexal mass  Treatment History: Andrea Williamson is a 71 year old female with a medical history that includes HTN, CVA s/p left A2 thrombectomy with stent placement 10/01/2021 who presented to the [facility] ER with symptoms of nausea, weakness, dizziness for the past week. Ct imaging of the head was negative for acute intracranial abnormality with small chronic appearing left ACA infarcts. 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  CT AP with contrast on 11/13/2021 revealed a solid and cystic right adnexal mass measuring 4.9 cms, non specific stranding in the RUQ of the abd with associated sub-centimeter lymph nodes. 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   To further evaluate the ovarian mass, she underwent a pelvic/transvaginal ultrasound with the impression showing a complex lesion in the right adnexa with mostly cystic echogenicity along with a lobulated solid nodule measuring 1.4 cm in size with demonstrable internal vascularity. Possibility of malignant neoplastic process in the right ovary is not excluded. 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 Left ovary is not sonographically visualized.  Ca1 25 was normal at 21.5.  Pelvic MRI was performed on 1/4 showing a complex solid and cystic 2.9 x 3 cm right ovarian mass with T2 hyperintensity and T1 hyperintensity.  Anteriorly there is a 1 cm enhancing soft tissue component which is corresponds to the ultrasound area of vascularity.  Interval History: Patient reports doing well since discharge from the hospital.  She denies any abdominal or pelvic pain.  She denies any vaginal bleeding or discharge.  She endorses a good appetite without nausea or emesis.  She notes that  weight continues to fluctuate a little bit up and down without significant change.  She is doing physical therapy.  She denies any speech or mobility deficits.  She is walking well on her own.  She continues to have bilateral hand tremors, similar to prior to her stroke.  Past Medical/Surgical History: Past Medical History:  Diagnosis Date   Deep vein thrombosis (DVT) ([place])    Hypertension    Stroke [facility])     Past Surgical History:  Procedure Laterality Date   ANKLE SURGERY Left 2008   IR CT HEAD LTD  10/05/2021   IR INTRA CRAN STENT  10/01/2021   IR PERCUTANEOUS ART THROMBECTOMY/INFUSION INTRACRANIAL INC DIAG ANGIO  10/01/2021   IR US GUIDE VASC ACCESS RIGHT  10/01/2021   RADIOLOGY WITH ANESTHESIA N/A 10/01/2021   Procedure: IR WITH ANESTHESIA;  Surgeon: Radiologist, Medication, MD;  Location: [place];  Service: Radiology;  Laterality: N/A;    Family History  Problem Relation Age of Onset   COPD Mother    Cancer Mother     Social History   Socioeconomic History   Marital status: Married    Spouse name: Not on file   Number of children: 3   Years of education: 12   Highest education level: 12th grade  Occupational History   Occupation: Retired  Tobacco Use   Smoking status: Former    Packs/day: 1.50    Years: 50.00    Pack years: 75.00    Types: Cigarettes    Quit date: 09/2021    Years since quitting: 0.2   Smokeless tobacco: Never  Vaping Use   Vaping Use: Never used  Substance and Sexual Activity   Alcohol use: Not Currently   Drug use: Not Currently   Sexual activity: Not Currently  Other Topics Concern   Not on file  Social History Narrative   October 2022 stopped   Social Determinants of Health   Financial Resource Strain: Low Risk    Difficulty of Paying Living Expenses: Not hard at all  Food Insecurity: No Food Insecurity   Worried About Charity fundraiser in the Last Year: Never true   Arboriculturist in the Last Year: Never true   Transportation Needs: No Transportation Needs   Lack of Transportation (Medical): No   Lack of Transportation (Non-Medical): No  Physical Activity: Insufficiently Active   Days of Exercise per Week: 2 days   Minutes of Exercise per Session: 60 min  Stress: Stress Concern Present   Feeling of Stress : To some extent  Social Connections: Moderately Isolated   Frequency of Communication with Friends and Family: More than three times a week   Frequency of Social Gatherings with Friends and Family: More than three times a week   Attends Religious Services: Never   Marine scientist or Organizations: No   Attends Archivist Meetings: Never   Marital Status: Married    Current Medications:  Current Outpatient Medications:    ALPRAZolam (XANAX) 0.25 MG tablet, Take 1 tablet (0.25 mg total) by mouth at bedtime as needed for anxiety. Take 1/2 tablet at bedtime for difficulty sleeping., Disp: 15 tablet, Rfl: 0   amLODipine (NORVASC) 5 MG tablet, Take 1 tablet (5 mg total) by mouth daily., Disp: 90 tablet, Rfl: 1   apixaban (ELIQUIS) 5 MG TABS tablet, Take 1 tablet (5 mg total) by mouth 2 (two) times daily., Disp: 60 tablet, Rfl: 1   atorvastatin (LIPITOR) 80 MG tablet, Take 1 tablet (80 mg total) by mouth daily., Disp: 90 tablet, Rfl: 1   ferrous sulfate 325 (65 FE) MG tablet, Take 325 mg by mouth daily with breakfast., Disp: , Rfl:    folic acid (FOLVITE) 1 MG tablet, Take 1 tablet (1 mg total) by mouth daily., Disp: , Rfl:    hydrALAZINE (APRESOLINE) 25 MG tablet, Take 1 tablet (25 mg total) by mouth every 8 (eight) hours as needed (SBP >170 or DBP >110)., Disp: , Rfl:    magnesium gluconate (MAGONATE) 500 MG tablet, Take 0.5 tablets (250 mg total) by mouth at bedtime., Disp: 30 tablet, Rfl: 0   pantoprazole (PROTONIX) 40 MG tablet, Take 1 tablet (40 mg total) by mouth daily., Disp: 30 tablet, Rfl: 1   predniSONE (DELTASONE) 10 MG tablet, Take 1 tablet (10 mg total) by mouth  daily with breakfast. 7 pills daily x 4 days, then 6 pills daily x 4 days, 5 pills daily x4 days, 4 pills x 4 days, 3 pills x 4 days, 2 pills x 4 days, then 1 pill x 4 days and then stop, Disp: 112 tablet, Rfl: 0   ticagrelor (BRILINTA) 60 MG TABS tablet, Take 1 tablet (60 mg total) by mouth 2 (two) times daily., Disp: 180 tablet, Rfl: 0   acetaminophen (TYLENOL) 325 MG tablet, Take 1-2 tablets (325-650 mg total) by mouth every 4 (four) hours as needed for mild pain., Disp: , Rfl:    ondansetron (ZOFRAN-ODT) 4 MG disintegrating tablet, Take 1 tablet (4 mg total) by mouth once as needed for nausea., Disp: 20 tablet, Rfl: 0  Review of Systems: Denies appetite changes, fevers, chills,  fatigue, unexplained weight changes. Denies hearing loss, neck lumps or masses, mouth sores, ringing in ears or voice changes. Denies cough or wheezing.  Denies shortness of breath. Denies chest pain or palpitations. Denies leg swelling. Denies abdominal distention, pain, blood in stools, constipation, diarrhea, nausea, vomiting, or early satiety. Denies pain with intercourse, dysuria, frequency, hematuria or incontinence. Denies hot flashes, pelvic pain, vaginal bleeding or vaginal discharge.   Denies joint pain, back pain or muscle pain/cramps. Denies itching, rash, or wounds. Denies dizziness, headaches, numbness or seizures. Denies swollen lymph nodes or glands, denies easy bruising or bleeding. Denies anxiety, depression, confusion, or decreased concentration.  Physical Exam: BP (!) 147/65 (BP Location: Left Arm, Patient Position: Sitting)    Pulse 81    Temp 98.2 F (36.8 C) (Oral)    Resp 16    Ht 5' 2.01" (1.575 m)    Wt 161 lb 9.6 oz (73.3 kg)    SpO2 99%    BMI 29.55 kg/m  General: Alert, oriented, no acute distress. HEENT: Normocephalic, atraumatic, sclera anicteric. Chest: Labored breathing on room air. Abdomen: soft, nontender.  Normoactive bowel sounds.  No masses or hepatosplenomegaly appreciated.    Extremities: Grossly normal range of motion.   Lymphatics: No cervical, supraclavicular, or inguinal adenopathy. GU: Normal appearing external genitalia without erythema, excoriation, or lesions.  Speculum exam reveals mildly atrophic vaginal mucosa, cervix grossly normal in appearance without lesions or masses.  No blood or discharge within the vaginal vault.  Bimanual exam reveals small nodule that feels intravaginal at the right apex, not appreciated on rectal exam.  Known nodularity otherwise.  Unable to palpate adnexal lesions.  Cervix is normal without lesions.  Uterus small and mobile.  Laboratory & Radiologic Studies: None new  Assessment & Plan: Andrea Williamson is a 71 y.o. woman with a complex small adnexal mass, normal CA-125, and recent history of both a stroke and bilateral DVTs.  The patient remains asymptomatic and continues to wish to avoid surgery.  We discussed MRI findings again that raise the concern for possible malignancy.   Due to proximity to both her stroke and bilateral DVT diagnosis, she is still high risk to come off anti-coagulation and anti-platelet therapy for surgery. I will reach out to heme/med onc as well as neurology regarding ideal timing of surgery with regard to risk related to her comorbidities.  I recommend close interval imaging to assess change in size or character of her small adnexal lesion. Will plan for MRI 3 months after last imaging, in early April.  She has asked that I call her daughter-in-law with results.   32 minutes of total time was spent for this patient encounter, including preparation, face-to-face counseling with the patient and coordination of care, and documentation of the encounter.  Jeral Pinch, MD  Division of Gynecologic Oncology  Department of Obstetrics and Gynecology  [facility] of [facility]

## 2021-12-26 ENCOUNTER — Encounter: Payer: Self-pay | Admitting: Hematology and Oncology

## 2021-12-29 ENCOUNTER — Ambulatory Visit: Payer: Medicare Other | Admitting: Physical Therapy

## 2021-12-29 ENCOUNTER — Other Ambulatory Visit: Payer: Self-pay

## 2021-12-29 ENCOUNTER — Encounter: Payer: Self-pay | Admitting: Physical Therapy

## 2021-12-29 DIAGNOSIS — M6281 Muscle weakness (generalized): Secondary | ICD-10-CM

## 2021-12-29 DIAGNOSIS — R278 Other lack of coordination: Secondary | ICD-10-CM

## 2021-12-29 DIAGNOSIS — R2689 Other abnormalities of gait and mobility: Secondary | ICD-10-CM

## 2021-12-29 DIAGNOSIS — R262 Difficulty in walking, not elsewhere classified: Secondary | ICD-10-CM

## 2021-12-29 DIAGNOSIS — R269 Unspecified abnormalities of gait and mobility: Secondary | ICD-10-CM

## 2021-12-29 DIAGNOSIS — R2681 Unsteadiness on feet: Secondary | ICD-10-CM

## 2021-12-29 NOTE — Therapy (Signed)
[place] MAIN [facility] SERVICES [address] [place], [place], [address] Phone: [phone]   Fax:  [phone]  Physical Therapy Treatment  Patient Details  Name: Andrea Williamson MRN: [id number] Date of Birth: Feb 28, 1951 Referring Provider (PT): Reesa Chew, [place]   Encounter Date: 12/29/2021   PT End of Session - 12/29/21 1016     Visit Number 15    Number of Visits 25    Date for PT Re-Evaluation 01/13/22    Authorization Time Period Eval performed: 10/21/21    Progress Note Due on Visit 10    PT Start Time 0932    PT Stop Time 1015    PT Time Calculation (min) 43 min    Equipment Utilized During Treatment Gait belt    Activity Tolerance Patient tolerated treatment well    Behavior During Therapy [facility] for tasks assessed/performed             Past Medical History:  Diagnosis Date   Deep vein thrombosis (DVT) ([place])    Hypertension    Stroke [facility])     Past Surgical History:  Procedure Laterality Date   ANKLE SURGERY Left 2008   IR CT HEAD LTD  10/05/2021   IR INTRA CRAN STENT  10/01/2021   IR PERCUTANEOUS ART THROMBECTOMY/INFUSION INTRACRANIAL INC DIAG ANGIO  10/01/2021   IR US GUIDE VASC ACCESS RIGHT  10/01/2021   RADIOLOGY WITH ANESTHESIA N/A 10/01/2021   Procedure: IR WITH ANESTHESIA;  Surgeon: Radiologist, Medication, MD;  Location: [facility];  Service: Radiology;  Laterality: N/A;    There were no vitals filed for this visit.   Subjective Assessment - 12/29/21 1016     Subjective Pt reports "I am fine. I am always fine." She denies any pain;    Patient is accompained by: Family member    Pertinent History Pt is a 71 y/o female presenting with R LE strength and balance deficits. Pt is s/p L hemisphere CVA, admitted to the ED 10/01/21. Imaging included MRI scans on 10/02/21 yielding "Redemonstrated infarcts in the left ACA and ACA/PCA watershed  territory, with one new area of restricted diffusion in the medial right parietal lobe,  likely an additional acute infarct, status post  A2 stenting. A small amount of associated petechial hemorrhage is  noted within the medial left frontal lobe, without significant  hemorrhagic transformation." per Dr. Merilyn Baba, MD. Pt has PMH that includes HLD.    Limitations Walking;Standing    How long can you sit comfortably? "a long time"    How long can you stand comfortably? 30 minutes to an hour    How long can you walk comfortably? 7 minutes    Diagnostic tests CT (10/01/21) - 1. The right common femoral artery has normal caliber, adequate for   vascular access.   2. 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   3. Intracranial atherosclerotic disease with multifocal areas of   mild stenosis in the left ACA and MCA vascular trees. MRI HEAD WITHOUT CONTRAST (10/12/21) - "Redemonstrated infarcts in the left ACA and ACA/PCA watershed  territory, with one new area of restricted diffusion in the medial right parietal lobe, likely an additional acute infarct, status post  A2 stenting. A small amount of associated petechial hemorrhage is  noted within the medial left frontal lobe, without significant  hemorrhagic transformation."    Patient Stated Goals wants to be able to go back to bowling in january, taking care of her dog  and cat    Currently in Pain? No/denies    Pain Score 0-No pain             Subjective: -"I feel fine. Nothing else bothers me but my balance."  No pain;   INTERVENTIONS     Standing on airex: -feet together:  Eyes open 15 sec hold, no sway, eyes closed 15 sec hold with sway to left side, unable to identify that; repeated x2 sets with better stance during 2nd set; -alternate toe taps airex to 6 inch step, unsupported x5 reps, patient expressed, "This is so stupid. Why am I doing this?"  Transitioned to instructing patient in activity she seemed interested in: Bowling over 8 meter track - x 5 min. Pt uses multiple attempts and eventual knocks over all cones.  Pt able to take 3-4 sequencial steps with good balance. She does exhibit increased tremors in RUE; During activity she expressed how stupid the activity was because she didn't have a ball with finger holes and the gym floor was not the same as a bowling room floor. PT utilized small SAEBO balls to improve patient's ability to palm ball but despite this it didn't help spark interest;  Patient does have a dog and reports she doesn't feel comfortable walking because she said her dog would pull her over if he started running; Patient standing feet apart: holding green tband in RUE, therapist pulling against band in various direction and various intensity/speed to simulate dog pulling on leash x2 min Progressed to feet together x2 min with minimal difficulty;  Progressed to tandem stance x2 min each with moderate difficulty noticed with patient being unsteady but patient states, "This is stupid. Why are we doing this?" PT re-iterated that this activity is working on stance control to improve balance with walking her dog PT progressed having patient walk around gym while therapist pulled on green tband in various direction to challenge dynamic control x300 feet. Patient seemed dis-interested despite some challenge with 1-2 episodes of instability. She was able to recover balance with supervision Upon sitting, patient states, "Man, this is so silly. I wish I were working with Hildred Alamin."   Therapist approached patient and asked what she would rather do. Patient states, "I would like to work on my balance."   Instructed patient in standing on airex pad: -tandem stance:  Eyes open 30 sec hold, min sway, eyes closed 10 sec hold, moderate sway  Eyes open, head turns side/side x5 reps, up/down x5 reps; X1 set each foot in front;  Patient has difficulty with eyes closed, with loss of balance, requiring min A for recovery; During tandem stance, she did have instability often stepping off airex pad to recover balance.  She exclaimed, "I don't like doing this." PT emphasized how working on activities that are challenging will help balance and improve stance control. Despite encouragement, patient dis-interested;     Dynamic balance interventions performed over 10 meters walkway, with CGA provided unless otherwise specified- Tandem walking 2x, patient reaching out with BUE, multiple mis-steps with poor accuracy with tandem stance;  Gait with vertical head turns 2x Gait with horizontal head turns 2x Pt most challenged with vertical head turns. No LOB but does veer mildly and occasionally mis-steps;      Pt educated throughout session about proper posture and technique with exercises. Improved exercise technique, movement at target joints, use of target muscles after min to mod verbal, visual, tactile cues.     Patient appears frustrated and dis-interested  throughout session. She expressed multiple times, "This is stupid and why do I have to do this." Caregiver reports that patient is often like this at home and its her norm. Patient was instructed in multiple tasks to challenge balance. Therapist tried to incorporate activities patient was interested but patient was not interested and thought the session was "stupid"  She does exhibit increased instability with narrow base of support, especially while on airex pad;                      PT Education - 12/29/21 0934     Education Details exercise technique/positioning;    Person(s) Educated Patient    Methods Explanation;Verbal cues    Comprehension Verbalized understanding;Returned demonstration;Verbal cues required;Need further instruction              PT Short Term Goals - 12/01/21 0939       PT SHORT TERM GOAL #1   Title Pt will be independent with HEP in order to improve strength and balance in order to decrease fall risk and improve function at home and work.    Baseline 10/21/21: HEP deferred to next visit; 1/17: Pt performing  progressive walking program, pt to increase time ambulated by 5 minutes    Time 6    Period Weeks    Status On-going    Target Date 01/12/22               PT Long Term Goals - 12/01/21 0939       PT LONG TERM GOAL #1   Title Pt will improve FOTO score to >71% to show improved functional mobility with ADLs    Baseline 10/21/21: 58%; 12/01/2021: 68%    Time 12    Period Weeks    Status Partially Met    Target Date 01/13/22      PT LONG TERM GOAL #2   Title Pt will improve BERG by at least 3 points in order to demonstrate clinically significant improvement in balance.    Baseline 10/21/21: 44/56; 1/17: 54/56    Time 12    Period Weeks    Status Achieved    Target Date 01/13/22      PT LONG TERM GOAL #3   Title Pt will decrease 5TSTS by at least 3 seconds in order to demonstrate clinically significant improvement in LE strength.    Baseline 10/21/21: 16.3 sec without UE support; 1/17: 13 sec hands-free    Time 12    Period Weeks    Status Achieved    Target Date 01/13/22      PT LONG TERM GOAL #4   Title Pt will improve 10MWT gait speed to >1.0 m/s with least restrictive AD to show clinically significant improvement and decrease risk of falls.    Baseline 10/21/21: 0.82 m/s with 2WW and CGA; 1/17 1 m/s without AD    Time 12    Period Weeks    Status Partially Met    Target Date 01/13/22      PT LONG TERM GOAL #5   Title Pt will increase 6MWT by at least 71m(1668f in order to demonstrate clinically significant improvement in cardiopulmonary endurance and community ambulation    Baseline 10/21/21: deferred to next visit; 10/27/2021= 790 feet with front wheeled walker; 1/17: 1065 ft no AD    Time 12    Period Weeks    Status Achieved    Target Date 01/13/22  Additional Long Term Goals   Additional Long Term Goals Yes      PT LONG TERM GOAL #6   Title Pt will report ability to ambulate without her RW outside for at least 30 minutes and without steadiness in order  to return patient to PLOF    Baseline 1/17: new    Time 12    Period Weeks    Status New    Target Date 02/23/22      PT LONG TERM GOAL #7   Title Pt will increase 6MWT by at least 200 ft in order to demonstrate clinically significant improvement in cardiopulmonary endurance and community ambulation    Baseline 1/17: 1065 ft (achieved previous 6MWT goal, can now be progressed further toward PLOF)    Time 12    Period Weeks    Status New    Target Date 02/23/22                   Plan - 12/29/21 1017     Clinical Impression Statement Patient very confrontational and not pleased with any part of the rehab session. She requires frequent cues for motivation to task and often reports feeling like the exercise is dumb and not worthwhile. Multiple variations to exercise were attempted to help re-direct to different tasks to spark interest. However despite changing activities, patient continues to be non-interested and expressed frustration. Patient does exhibit increased instability with tandem stance especially while on compliant surface. She would benefit from additional skilled PT Intervention to improve balance/gait safety; Consider keeping patient with same therapist to prevent change and improve compliance with therapy services.    Personal Factors and Comorbidities Age;Comorbidity 1;Sex;Time since onset of injury/illness/exacerbation;Fitness;Social Background    Comorbidities HLD    Examination-Activity Limitations Bathing;Bed Mobility;Caring for Others;Reach Overhead;Hygiene/Grooming;Self Feeding;Squat;Stairs;Transfers;Toileting;Sleep;Locomotion Level;Lift;Stand    Examination-Participation Restrictions Cleaning;Community Activity;Shop;Laundry;Meal Prep;Volunteer;Driving;Interpersonal Relationship    Stability/Clinical Decision Making Stable/Uncomplicated    Rehab Potential Good    PT Frequency 2x / week    PT Duration 12 weeks    PT Treatment/Interventions ADLs/Self Care Home  Management;Cryotherapy;Electrical Stimulation;Moist Heat;Ultrasound;Gait training;Stair training;DME Instruction;Functional mobility training;Therapeutic activities;Therapeutic exercise;Balance training;Neuromuscular re-education;Patient/family education;Manual techniques;Energy conservation;Passive range of motion;Joint Manipulations;Splinting;Taping;Vestibular;Visual/perceptual remediation/compensation;Wheelchair mobility training;Canalith Repostioning    PT Next Visit Plan Progress dynamic balance, strengthening as pt is able, continue to monitor BP, progress endurance, continue POC as previously indicated    PT Home Exercise Plan 10/27/2021=Access Code: [email or web address]; 1/17: Education on progressing home walking program. PT recommends pt increase walking duration by 5 minutes this week.; no updates    Consulted and Agree with Plan of Care Patient             Patient will benefit from skilled therapeutic intervention in order to improve the following deficits and impairments:  Abnormal gait, Decreased activity tolerance, Decreased endurance, Decreased knowledge of use of DME, Decreased strength, Impaired sensation, Hypomobility, Decreased range of motion, Impaired UE functional use, Improper body mechanics, Pain, Decreased balance, Decreased coordination, Decreased mobility, Decreased safety awareness, Difficulty walking, Impaired flexibility, Postural dysfunction  Visit Diagnosis: Unsteadiness on feet  Muscle weakness (generalized)  Other abnormalities of gait and mobility  Other lack of coordination  Abnormality of gait and mobility  Difficulty in walking, not elsewhere classified     Problem List Patient Active Problem List   Diagnosis Date Noted   History of stroke 11/13/2021   Essential hypertension 11/13/2021   DVT (deep venous thrombosis) ([place]) 11/13/2021   Adnexal mass 11/13/2021   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 [place], [place], [address] Phone: [phone]   Fax:  [phone]  Name: CASSIDEY BARRALES MRN: [id number] Date of Birth: 1950/11/27

## 2021-12-30 ENCOUNTER — Telehealth: Payer: Self-pay | Admitting: *Deleted

## 2021-12-30 NOTE — Telephone Encounter (Signed)
Per Dr Berline Lopes fax surgical optimization form and records to the patient's neurology and medical oncology offices    Dr Sethi--neurology  Dr Rao--medical oncology

## 2021-12-31 ENCOUNTER — Ambulatory Visit: Payer: Medicare Other

## 2021-12-31 ENCOUNTER — Other Ambulatory Visit: Payer: Self-pay

## 2021-12-31 DIAGNOSIS — R2681 Unsteadiness on feet: Secondary | ICD-10-CM

## 2021-12-31 DIAGNOSIS — R2689 Other abnormalities of gait and mobility: Secondary | ICD-10-CM | POA: Diagnosis not present

## 2021-12-31 DIAGNOSIS — R278 Other lack of coordination: Secondary | ICD-10-CM | POA: Diagnosis not present

## 2021-12-31 DIAGNOSIS — M6281 Muscle weakness (generalized): Secondary | ICD-10-CM | POA: Diagnosis not present

## 2021-12-31 DIAGNOSIS — R262 Difficulty in walking, not elsewhere classified: Secondary | ICD-10-CM

## 2021-12-31 DIAGNOSIS — R269 Unspecified abnormalities of gait and mobility: Secondary | ICD-10-CM | POA: Diagnosis not present

## 2021-12-31 NOTE — Therapy (Signed)
[place] MAIN [facility] SERVICES [address] [place], [place], [address] Phone: [phone]   Fax:  [phone]  Physical Therapy Treatment  Patient Details  Name: Andrea Williamson MRN: [id number] Date of Birth: January 14, 1951 Referring Provider (PT): Reesa Chew, [place]   Encounter Date: 12/31/2021   PT End of Session - 12/31/21 0810     Visit Number 16    Number of Visits 25    Date for PT Re-Evaluation 01/13/22    Authorization Time Period Eval performed: 10/21/21    Progress Note Due on Visit 10    PT Start Time 0931    PT Stop Time 1013    PT Time Calculation (min) 42 min    Equipment Utilized During Treatment Gait belt    Activity Tolerance Patient tolerated treatment well    Behavior During Therapy [facility] for tasks assessed/performed             Past Medical History:  Diagnosis Date   Deep vein thrombosis (DVT) ([place])    Hypertension    Stroke [facility])     Past Surgical History:  Procedure Laterality Date   ANKLE SURGERY Left 2008   IR CT HEAD LTD  10/05/2021   IR INTRA CRAN STENT  10/01/2021   IR PERCUTANEOUS ART THROMBECTOMY/INFUSION INTRACRANIAL INC DIAG ANGIO  10/01/2021   IR US GUIDE VASC ACCESS RIGHT  10/01/2021   RADIOLOGY WITH ANESTHESIA N/A 10/01/2021   Procedure: IR WITH ANESTHESIA;  Surgeon: Radiologist, Medication, MD;  Location: [place];  Service: Radiology;  Laterality: N/A;    There were no vitals filed for this visit.   Subjective Assessment - 12/31/21 0934     Subjective Patient reports she is really tired today - States she is not sleeping well.    Patient is accompained by: Family member    Pertinent History Pt is a 71 y/o female presenting with R LE strength and balance deficits. Pt is s/p L hemisphere CVA, admitted to the ED 10/01/21. Imaging included MRI scans on 10/02/21 yielding "Redemonstrated infarcts in the left ACA and ACA/PCA watershed  territory, with one new area of restricted diffusion in the medial right  parietal lobe, likely an additional acute infarct, status post  A2 stenting. A small amount of associated petechial hemorrhage is  noted within the medial left frontal lobe, without significant  hemorrhagic transformation." per Dr. Merilyn Baba, MD. Pt has PMH that includes HLD.    Limitations Walking;Standing    How long can you sit comfortably? "a long time"    How long can you stand comfortably? 30 minutes to an hour    How long can you walk comfortably? 7 minutes    Diagnostic tests CT (10/01/21) - 1. The right common femoral artery has normal caliber, adequate for   vascular access.   2. 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   3. Intracranial atherosclerotic disease with multifocal areas of   mild stenosis in the left ACA and MCA vascular trees. MRI HEAD WITHOUT CONTRAST (10/12/21) - "Redemonstrated infarcts in the left ACA and ACA/PCA watershed  territory, with one new area of restricted diffusion in the medial right parietal lobe, likely an additional acute infarct, status post  A2 stenting. A small amount of associated petechial hemorrhage is  noted within the medial left frontal lobe, without significant  hemorrhagic transformation."    Patient Stated Goals wants to be able to go back to bowling in january, taking care of her  dog and cat    Currently in Pain? No/denies            INTERVENTIONS:  Neuromuscular re-ed:  (in // bars with CGA- gait belt)   -Step ups (12 reps) - "This is too easy- I need something harder" - Patient performed without UE support yet several loss of balance stepping back down- catching her toe on step requiring UE support.   -Side step up/over hurdle x 12 reps- No UE support- Several episodes of unsteadiness - using 1 UE to reach for support.   -Single leg stance- attempting to static hold 5-10 sec x mult attempts- Patient able to hold 3-6 sec each leg. She was easily frustrated stating "I don't know why I can't do this."  Tandem walk- forward x 4  trials- VC to slow down for more work on balance.   Sit to stand with 1 LE on blue pad x 10 reps each LE (20 total)   Walk- weave around hedgehogs x 6 in // bars x 2  Side steps over hedgehogs x 6 down and back x 2  Walk in // bars with squat to pick up hedgehogs x 6 - no dizziness and performed with close supervision.     BP= 156/81 mm Hg and HR= 90  Education provided throughout session via VC/TC and demonstration to facilitate movement at target joints and correct muscle activation for all testing and exercises performed.     10 MWT= 1.1 m/s          PT Education - 01/01/22 0810     Education Details Exercise technique    Person(s) Educated Patient    Methods Explanation;Demonstration;Tactile cues;Verbal cues    Comprehension Verbalized understanding;Returned demonstration;Verbal cues required;Tactile cues required;Need further instruction              PT Short Term Goals - 12/01/21 0939       PT SHORT TERM GOAL #1   Title Pt will be independent with HEP in order to improve strength and balance in order to decrease fall risk and improve function at home and work.    Baseline 10/21/21: HEP deferred to next visit; 1/17: Pt performing progressive walking program, pt to increase time ambulated by 5 minutes    Time 6    Period Weeks    Status On-going    Target Date 01/12/22               PT Long Term Goals - 12/01/21 0939       PT LONG TERM GOAL #1   Title Pt will improve FOTO score to >71% to show improved functional mobility with ADLs    Baseline 10/21/21: 58%; 12/01/2021: 68%    Time 12    Period Weeks    Status Partially Met    Target Date 01/13/22      PT LONG TERM GOAL #2   Title Pt will improve BERG by at least 3 points in order to demonstrate clinically significant improvement in balance.    Baseline 10/21/21: 44/56; 1/17: 54/56    Time 12    Period Weeks    Status Achieved    Target Date 01/13/22      PT LONG TERM GOAL #3   Title Pt  will decrease 5TSTS by at least 3 seconds in order to demonstrate clinically significant improvement in LE strength.    Baseline 10/21/21: 16.3 sec without UE support; 1/17: 13 sec hands-free    Time 12  Period Weeks    Status Achieved    Target Date 01/13/22      PT LONG TERM GOAL #4   Title Pt will improve 10MWT gait speed to >1.0 m/s with least restrictive AD to show clinically significant improvement and decrease risk of falls.    Baseline 10/21/21: 0.82 m/s with 2WW and CGA; 1/17 1 m/s without AD    Time 12    Period Weeks    Status Partially Met    Target Date 01/13/22      PT LONG TERM GOAL #5   Title Pt will increase 6MWT by at least 71m(1635f in order to demonstrate clinically significant improvement in cardiopulmonary endurance and community ambulation    Baseline 10/21/21: deferred to next visit; 10/27/2021= 790 feet with front wheeled walker; 1/17: 1065 ft no AD    Time 12    Period Weeks    Status Achieved    Target Date 01/13/22      Additional Long Term Goals   Additional Long Term Goals Yes      PT LONG TERM GOAL #6   Title Pt will report ability to ambulate without her RW outside for at least 30 minutes and without steadiness in order to return patient to PLOF    Baseline 1/17: new    Time 12    Period Weeks    Status New    Target Date 02/23/22      PT LONG TERM GOAL #7   Title Pt will increase 6MWT by at least 200 ft in order to demonstrate clinically significant improvement in cardiopulmonary endurance and community ambulation    Baseline 1/17: 1065 ft (achieved previous 6MWT goal, can now be progressed further toward PLOF)    Time 12    Period Weeks    Status New    Target Date 02/23/22                   Plan - 12/31/21 0815     Clinical Impression Statement Patient continues to demonstrate poor motivation for session but was able to participate with some encouragement. Patient does not appear to have full insight into her impairments. She  stated that she doesn't do any of the exercises other than walking at home. Discussed importance of adherence to HEP with patient and her sister-in-law. Decided to allow patient to practice on her own for next 2 weeks then return for a reassessment/potential Discharge as approriate. Patient did agree to try her exercises for next 2 weeks then return for reassessment.    Personal Factors and Comorbidities Age;Comorbidity 1;Sex;Time since onset of injury/illness/exacerbation;Fitness;Social Background    Comorbidities HLD    Examination-Activity Limitations Bathing;Bed Mobility;Caring for Others;Reach Overhead;Hygiene/Grooming;Self Feeding;Squat;Stairs;Transfers;Toileting;Sleep;Locomotion Level;Lift;Stand    Examination-Participation Restrictions Cleaning;Community Activity;Shop;Laundry;Meal Prep;Volunteer;Driving;Interpersonal Relationship    Stability/Clinical Decision Making Stable/Uncomplicated    Rehab Potential Good    PT Frequency 2x / week    PT Duration 12 weeks    PT Treatment/Interventions ADLs/Self Care Home Management;Cryotherapy;Electrical Stimulation;Moist Heat;Ultrasound;Gait training;Stair training;DME Instruction;Functional mobility training;Therapeutic activities;Therapeutic exercise;Balance training;Neuromuscular re-education;Patient/family education;Manual techniques;Energy conservation;Passive range of motion;Joint Manipulations;Splinting;Taping;Vestibular;Visual/perceptual remediation/compensation;Wheelchair mobility training;Canalith Repostioning    PT Next Visit Plan Progress dynamic balance, strengthening as pt is able, continue to monitor BP, progress endurance, continue POC as previously indicated    PT Home Exercise Plan 10/27/2021=Access Code: [email or web address]: Education on progressing home walking program. PT recommends pt increase walking duration by 5 minutes this week.; no updates    Consulted and Agree with  Plan of Care Patient             Patient will benefit from  skilled therapeutic intervention in order to improve the following deficits and impairments:  Abnormal gait, Decreased activity tolerance, Decreased endurance, Decreased knowledge of use of DME, Decreased strength, Impaired sensation, Hypomobility, Decreased range of motion, Impaired UE functional use, Improper body mechanics, Pain, Decreased balance, Decreased coordination, Decreased mobility, Decreased safety awareness, Difficulty walking, Impaired flexibility, Postural dysfunction  Visit Diagnosis: Abnormality of gait and mobility  Difficulty in walking, not elsewhere classified  Muscle weakness (generalized)  Unsteadiness on feet     Problem List Patient Active Problem List   Diagnosis Date Noted   History of stroke 11/13/2021   Essential hypertension 11/13/2021   DVT (deep venous thrombosis) ([place]) 11/13/2021   Adnexal mass 11/13/2021   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 [place], [place], [address] Phone: [phone]   Fax:  [phone]  Name: Andrea Williamson MRN: [id number] Date of Birth: [birth year]

## 2022-01-04 ENCOUNTER — Encounter: Payer: Self-pay | Admitting: Emergency Medicine

## 2022-01-04 ENCOUNTER — Other Ambulatory Visit: Payer: Self-pay

## 2022-01-04 ENCOUNTER — Telehealth: Payer: Self-pay | Admitting: *Deleted

## 2022-01-04 DIAGNOSIS — E785 Hyperlipidemia, unspecified: Secondary | ICD-10-CM | POA: Diagnosis present

## 2022-01-04 DIAGNOSIS — I2699 Other pulmonary embolism without acute cor pulmonale: Secondary | ICD-10-CM | POA: Diagnosis not present

## 2022-01-04 DIAGNOSIS — I471 Supraventricular tachycardia: Secondary | ICD-10-CM | POA: Diagnosis not present

## 2022-01-04 DIAGNOSIS — Z86718 Personal history of other venous thrombosis and embolism: Secondary | ICD-10-CM

## 2022-01-04 DIAGNOSIS — I10 Essential (primary) hypertension: Secondary | ICD-10-CM | POA: Diagnosis not present

## 2022-01-04 DIAGNOSIS — Z79899 Other long term (current) drug therapy: Secondary | ICD-10-CM | POA: Diagnosis not present

## 2022-01-04 DIAGNOSIS — Z7901 Long term (current) use of anticoagulants: Secondary | ICD-10-CM | POA: Diagnosis not present

## 2022-01-04 DIAGNOSIS — F32A Depression, unspecified: Secondary | ICD-10-CM | POA: Diagnosis not present

## 2022-01-04 DIAGNOSIS — J9601 Acute respiratory failure with hypoxia: Secondary | ICD-10-CM | POA: Diagnosis not present

## 2022-01-04 DIAGNOSIS — I82403 Acute embolism and thrombosis of unspecified deep veins of lower extremity, bilateral: Secondary | ICD-10-CM | POA: Diagnosis not present

## 2022-01-04 DIAGNOSIS — Z20822 Contact with and (suspected) exposure to covid-19: Secondary | ICD-10-CM | POA: Diagnosis not present

## 2022-01-04 DIAGNOSIS — Z87891 Personal history of nicotine dependence: Secondary | ICD-10-CM | POA: Diagnosis not present

## 2022-01-04 DIAGNOSIS — N179 Acute kidney failure, unspecified: Secondary | ICD-10-CM | POA: Diagnosis not present

## 2022-01-04 DIAGNOSIS — E871 Hypo-osmolality and hyponatremia: Secondary | ICD-10-CM | POA: Diagnosis not present

## 2022-01-04 DIAGNOSIS — D5911 Warm autoimmune hemolytic anemia: Secondary | ICD-10-CM | POA: Diagnosis present

## 2022-01-04 DIAGNOSIS — D75839 Thrombocytosis, unspecified: Secondary | ICD-10-CM | POA: Diagnosis not present

## 2022-01-04 DIAGNOSIS — E876 Hypokalemia: Secondary | ICD-10-CM | POA: Diagnosis not present

## 2022-01-04 DIAGNOSIS — R040 Epistaxis: Secondary | ICD-10-CM | POA: Diagnosis present

## 2022-01-04 DIAGNOSIS — D62 Acute posthemorrhagic anemia: Secondary | ICD-10-CM | POA: Diagnosis not present

## 2022-01-04 DIAGNOSIS — R42 Dizziness and giddiness: Secondary | ICD-10-CM | POA: Diagnosis not present

## 2022-01-04 DIAGNOSIS — R531 Weakness: Secondary | ICD-10-CM | POA: Diagnosis not present

## 2022-01-04 DIAGNOSIS — F419 Anxiety disorder, unspecified: Secondary | ICD-10-CM | POA: Diagnosis present

## 2022-01-04 DIAGNOSIS — Z79891 Long term (current) use of opiate analgesic: Secondary | ICD-10-CM

## 2022-01-04 DIAGNOSIS — Z8673 Personal history of transient ischemic attack (TIA), and cerebral infarction without residual deficits: Secondary | ICD-10-CM

## 2022-01-04 LAB — CBC
HCT: 27.5 % — ABNORMAL LOW (ref 36.0–46.0)
Hemoglobin: 9.2 g/dL — ABNORMAL LOW (ref 12.0–15.0)
MCH: 35.4 pg — ABNORMAL HIGH (ref 26.0–34.0)
MCHC: 33.5 g/dL (ref 30.0–36.0)
MCV: 105.8 fL — ABNORMAL HIGH (ref 80.0–100.0)
Platelets: 310 10*3/uL (ref 150–400)
RBC: 2.6 MIL/uL — ABNORMAL LOW (ref 3.87–5.11)
RDW: 14.1 % (ref 11.5–15.5)
WBC: 9.6 10*3/uL (ref 4.0–10.5)
nRBC: 0.2 % (ref 0.0–0.2)

## 2022-01-04 LAB — BASIC METABOLIC PANEL
Anion gap: 10 (ref 5–15)
BUN: 33 mg/dL — ABNORMAL HIGH (ref 8–23)
CO2: 21 mmol/L — ABNORMAL LOW (ref 22–32)
Calcium: 8.9 mg/dL (ref 8.9–10.3)
Chloride: 100 mmol/L (ref 98–111)
Creatinine, Ser: 1.65 mg/dL — ABNORMAL HIGH (ref 0.44–1.00)
GFR, Estimated: 33 mL/min — ABNORMAL LOW (ref >60)
Glucose, Bld: 109 mg/dL — ABNORMAL HIGH (ref 70–99)
Potassium: 3.8 mmol/L (ref 3.5–5.1)
Sodium: 131 mmol/L — ABNORMAL LOW (ref 135–145)

## 2022-01-04 LAB — PROTIME-INR
INR: 1.4 — ABNORMAL HIGH (ref 0.8–1.2)
Prothrombin Time: 17.4 seconds — ABNORMAL HIGH (ref 11.4–15.2)

## 2022-01-04 NOTE — ED Triage Notes (Signed)
Pt to ED via POV with c/o dizziness and weakness that began on Friday. She states that she has also had some blood clots come out her nose but has since stopped.  She is on Eliquist Pts husband states that Dr. Janese Banks her oncologist sent them here to be evaluated.

## 2022-01-04 NOTE — Telephone Encounter (Signed)
Go to ER

## 2022-01-04 NOTE — Telephone Encounter (Signed)
Call returned to daughter Jonelle Sidle and I got her voice mail. I left message for her to take patient to ER and to call back if she had any questions

## 2022-01-04 NOTE — Telephone Encounter (Signed)
Daughter called answering service reporting that patient is blowing blood clots out of her nose and mouth. 8 Clots out of right nostril. And spitting out clots too. Not eating or sleeping well and her tremors are worse also. She is on 2 blood thinners. She was taken to Urgent Care who told them to call us or take her to ER if we did not call her back. Please advise

## 2022-01-05 ENCOUNTER — Ambulatory Visit: Payer: Medicare Other | Admitting: Physical Therapy

## 2022-01-05 ENCOUNTER — Inpatient Hospital Stay: Payer: Medicare Other

## 2022-01-05 ENCOUNTER — Encounter: Payer: Self-pay | Admitting: Internal Medicine

## 2022-01-05 ENCOUNTER — Inpatient Hospital Stay
Admission: EM | Admit: 2022-01-05 | Discharge: 2022-01-15 | DRG: 811 | Disposition: A | Discharge status: Home / Self Care | Payer: Medicare Other | Attending: Obstetrics and Gynecology | Admitting: Obstetrics and Gynecology

## 2022-01-05 ENCOUNTER — Other Ambulatory Visit: Payer: Self-pay

## 2022-01-05 DIAGNOSIS — R509 Fever, unspecified: Secondary | ICD-10-CM | POA: Diagnosis present

## 2022-01-05 DIAGNOSIS — F419 Anxiety disorder, unspecified: Secondary | ICD-10-CM | POA: Diagnosis present

## 2022-01-05 DIAGNOSIS — I10 Essential (primary) hypertension: Secondary | ICD-10-CM | POA: Diagnosis present

## 2022-01-05 DIAGNOSIS — D75839 Thrombocytosis, unspecified: Secondary | ICD-10-CM | POA: Diagnosis not present

## 2022-01-05 DIAGNOSIS — Z79899 Other long term (current) drug therapy: Secondary | ICD-10-CM | POA: Diagnosis not present

## 2022-01-05 DIAGNOSIS — N9489 Other specified conditions associated with female genital organs and menstrual cycle: Secondary | ICD-10-CM

## 2022-01-05 DIAGNOSIS — E871 Hypo-osmolality and hyponatremia: Secondary | ICD-10-CM | POA: Diagnosis present

## 2022-01-05 DIAGNOSIS — F32A Depression, unspecified: Secondary | ICD-10-CM | POA: Diagnosis present

## 2022-01-05 DIAGNOSIS — I82403 Acute embolism and thrombosis of unspecified deep veins of lower extremity, bilateral: Secondary | ICD-10-CM | POA: Diagnosis not present

## 2022-01-05 DIAGNOSIS — Z8673 Personal history of transient ischemic attack (TIA), and cerebral infarction without residual deficits: Secondary | ICD-10-CM

## 2022-01-05 DIAGNOSIS — D5911 Warm autoimmune hemolytic anemia: Secondary | ICD-10-CM | POA: Diagnosis present

## 2022-01-05 DIAGNOSIS — Z79891 Long term (current) use of opiate analgesic: Secondary | ICD-10-CM | POA: Diagnosis not present

## 2022-01-05 DIAGNOSIS — R040 Epistaxis: Secondary | ICD-10-CM | POA: Diagnosis present

## 2022-01-05 DIAGNOSIS — A419 Sepsis, unspecified organism: Secondary | ICD-10-CM | POA: Diagnosis not present

## 2022-01-05 DIAGNOSIS — R531 Weakness: Secondary | ICD-10-CM | POA: Diagnosis not present

## 2022-01-05 DIAGNOSIS — D649 Anemia, unspecified: Secondary | ICD-10-CM | POA: Diagnosis not present

## 2022-01-05 DIAGNOSIS — K820 Obstruction of gallbladder: Secondary | ICD-10-CM | POA: Diagnosis not present

## 2022-01-05 DIAGNOSIS — Z86718 Personal history of other venous thrombosis and embolism: Secondary | ICD-10-CM | POA: Diagnosis not present

## 2022-01-05 DIAGNOSIS — I471 Supraventricular tachycardia: Secondary | ICD-10-CM | POA: Diagnosis not present

## 2022-01-05 DIAGNOSIS — I2699 Other pulmonary embolism without acute cor pulmonale: Secondary | ICD-10-CM | POA: Diagnosis not present

## 2022-01-05 DIAGNOSIS — J9601 Acute respiratory failure with hypoxia: Secondary | ICD-10-CM | POA: Diagnosis not present

## 2022-01-05 DIAGNOSIS — N179 Acute kidney failure, unspecified: Secondary | ICD-10-CM | POA: Diagnosis present

## 2022-01-05 DIAGNOSIS — Z7901 Long term (current) use of anticoagulants: Secondary | ICD-10-CM

## 2022-01-05 DIAGNOSIS — E785 Hyperlipidemia, unspecified: Secondary | ICD-10-CM | POA: Diagnosis present

## 2022-01-05 DIAGNOSIS — J432 Centrilobular emphysema: Secondary | ICD-10-CM | POA: Diagnosis not present

## 2022-01-05 DIAGNOSIS — Z87891 Personal history of nicotine dependence: Secondary | ICD-10-CM | POA: Diagnosis not present

## 2022-01-05 DIAGNOSIS — K429 Umbilical hernia without obstruction or gangrene: Secondary | ICD-10-CM | POA: Diagnosis not present

## 2022-01-05 DIAGNOSIS — R42 Dizziness and giddiness: Secondary | ICD-10-CM | POA: Diagnosis present

## 2022-01-05 DIAGNOSIS — Z20822 Contact with and (suspected) exposure to covid-19: Secondary | ICD-10-CM | POA: Diagnosis present

## 2022-01-05 DIAGNOSIS — J9811 Atelectasis: Secondary | ICD-10-CM | POA: Diagnosis not present

## 2022-01-05 DIAGNOSIS — I82409 Acute embolism and thrombosis of unspecified deep veins of unspecified lower extremity: Secondary | ICD-10-CM | POA: Diagnosis present

## 2022-01-05 DIAGNOSIS — D62 Acute posthemorrhagic anemia: Secondary | ICD-10-CM | POA: Diagnosis present

## 2022-01-05 DIAGNOSIS — E876 Hypokalemia: Secondary | ICD-10-CM | POA: Diagnosis not present

## 2022-01-05 DIAGNOSIS — I2510 Atherosclerotic heart disease of native coronary artery without angina pectoris: Secondary | ICD-10-CM | POA: Diagnosis not present

## 2022-01-05 LAB — HEMOGLOBIN AND HEMATOCRIT, BLOOD
HCT: 23 % — ABNORMAL LOW (ref 36.0–46.0)
Hemoglobin: 7.8 g/dL — ABNORMAL LOW (ref 12.0–15.0)

## 2022-01-05 LAB — URINALYSIS, ROUTINE W REFLEX MICROSCOPIC
Bilirubin Urine: NEGATIVE
Glucose, UA: NEGATIVE mg/dL
Ketones, ur: NEGATIVE mg/dL
Leukocytes,Ua: NEGATIVE
Nitrite: NEGATIVE
Protein, ur: NEGATIVE mg/dL
Specific Gravity, Urine: 1.012 (ref 1.005–1.030)
pH: 5 (ref 5.0–8.0)

## 2022-01-05 LAB — TROPONIN I (HIGH SENSITIVITY)
Troponin I (High Sensitivity): 25 ng/L — ABNORMAL HIGH (ref <18)
Troponin I (High Sensitivity): 22 ng/L — ABNORMAL HIGH (ref <18)

## 2022-01-05 LAB — HEPATIC FUNCTION PANEL
ALT: 32 U/L (ref 0–44)
AST: 26 U/L (ref 15–41)
Albumin: 3.3 g/dL — ABNORMAL LOW (ref 3.5–5.0)
Alkaline Phosphatase: 49 U/L (ref 38–126)
Bilirubin, Direct: 0.1 mg/dL (ref 0.0–0.2)
Indirect Bilirubin: 0.9 mg/dL (ref 0.3–0.9)
Total Bilirubin: 1 mg/dL (ref 0.3–1.2)
Total Protein: 6.4 g/dL — ABNORMAL LOW (ref 6.5–8.1)

## 2022-01-05 LAB — RESP PANEL BY RT-PCR (FLU A&B, COVID) ARPGX2
Influenza A by PCR: NEGATIVE
Influenza B by PCR: NEGATIVE
SARS Coronavirus 2 by RT PCR: NEGATIVE

## 2022-01-05 LAB — CBC
HCT: 24.2 % — ABNORMAL LOW (ref 36.0–46.0)
Hemoglobin: 8.1 g/dL — ABNORMAL LOW (ref 12.0–15.0)
MCH: 35.5 pg — ABNORMAL HIGH (ref 26.0–34.0)
MCHC: 33.5 g/dL (ref 30.0–36.0)
MCV: 106.1 fL — ABNORMAL HIGH (ref 80.0–100.0)
Platelets: 291 10*3/uL (ref 150–400)
RBC: 2.28 MIL/uL — ABNORMAL LOW (ref 3.87–5.11)
RDW: 14 % (ref 11.5–15.5)
WBC: 9.3 10*3/uL (ref 4.0–10.5)
nRBC: 0.2 % (ref 0.0–0.2)

## 2022-01-05 LAB — RETICULOCYTES
Immature Retic Fract: 34.7 % — ABNORMAL HIGH (ref 2.3–15.9)
RBC.: 2.59 MIL/uL — ABNORMAL LOW (ref 3.87–5.11)
Retic Count, Absolute: 80.3 10*3/uL (ref 19.0–186.0)
Retic Ct Pct: 3.1 % (ref 0.4–3.1)

## 2022-01-05 LAB — PATHOLOGIST SMEAR REVIEW

## 2022-01-05 LAB — CBG MONITORING, ED: Glucose-Capillary: 126 mg/dL — ABNORMAL HIGH (ref 70–99)

## 2022-01-05 LAB — LACTIC ACID, PLASMA
Lactic Acid, Venous: 2 mmol/L (ref 0.5–1.9)
Lactic Acid, Venous: 1.5 mmol/L (ref 0.5–1.9)

## 2022-01-05 LAB — LACTATE DEHYDROGENASE: LDH: 248 U/L — ABNORMAL HIGH (ref 98–192)

## 2022-01-05 IMAGING — DX DG CHEST 1V PORT
1 series · 1 of 1 positions shown · non-contrast
Comparison: CT of the chest on [DATE]

CLINICAL DATA: Weakness and lightheadedness.

EXAM:
PORTABLE CHEST 1 VIEW

[chest ap]
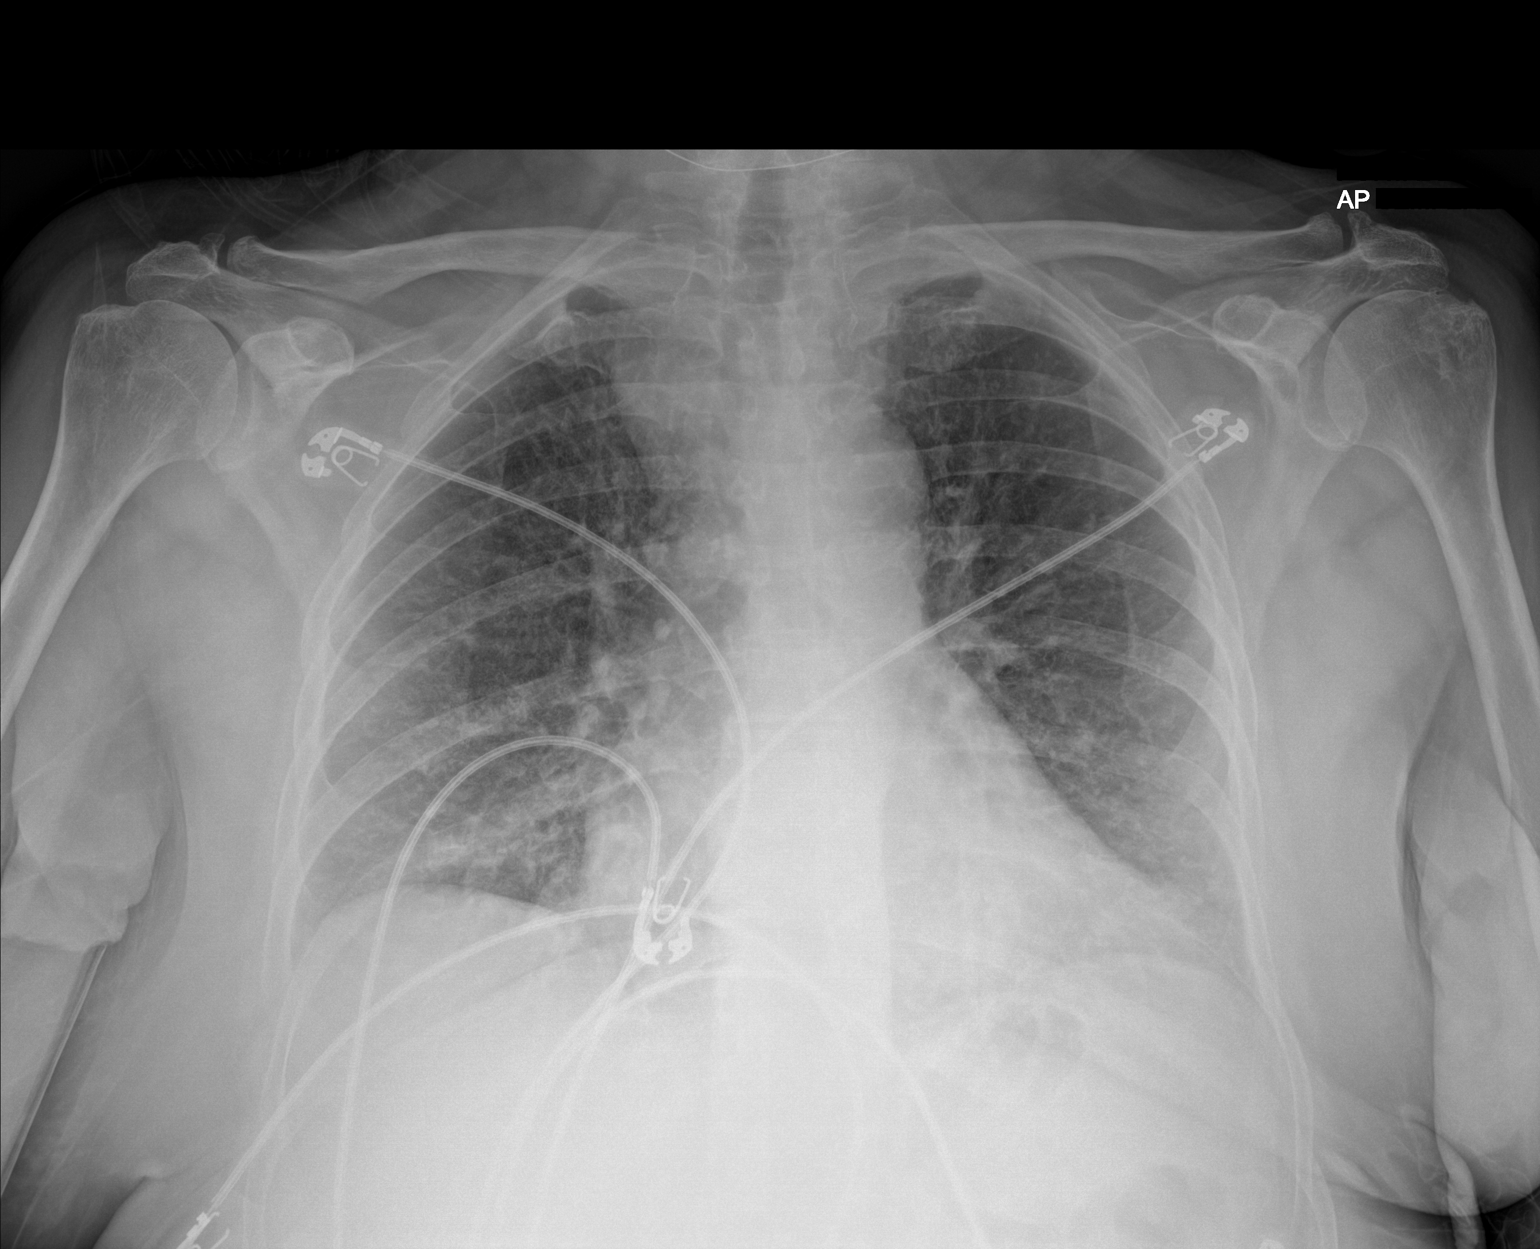

[1 of 1 positions shown; findings below may reference images not displayed]

FINDINGS: The heart size and mediastinal contours are within normal limits.
Mild pulmonary interstitial prominence without overt edema, pleural
fluid or pneumothorax. Mild underlying pulmonary interstitial edema
cannot be excluded. There is atelectasis at both lung bases, right
greater than left. The visualized skeletal structures are
unremarkable.
IMPRESSION: Mild pulmonary interstitial prominence may represent mild underlying
pulmonary interstitial edema. Bibasilar atelectasis, right greater
than left.

## 2022-01-05 MED ORDER — APIXABAN 5 MG PO TABS: 5.0000 mg | ORAL_TABLET | Freq: Two times a day (BID) | ORAL | Status: DC

## 2022-01-05 MED ORDER — FERROUS SULFATE 325 (65 FE) MG PO TABS
325.0000 mg | ORAL_TABLET | Freq: Every day | ORAL | Status: DC
  Administered 2022-01-06 – 2022-01-15 (×10): 325 mg via ORAL
  Filled 2022-01-05 (×11): qty 1

## 2022-01-05 MED ORDER — ACETAMINOPHEN 325 MG PO TABS
650.0000 mg | ORAL_TABLET | Freq: Four times a day (QID) | ORAL | Status: DC | PRN
  Administered 2022-01-05 – 2022-01-11 (×14): 650 mg via ORAL
  Filled 2022-01-05 (×14): qty 2

## 2022-01-05 MED ORDER — LACTATED RINGERS IV BOLUS
1000.0000 mL | Freq: Once | INTRAVENOUS | Status: AC
  Administered 2022-01-05: 1000 mL via INTRAVENOUS

## 2022-01-05 MED ORDER — METOPROLOL TARTRATE 5 MG/5ML IV SOLN: 5.0000 mg | Freq: Once | INTRAVENOUS | Status: DC

## 2022-01-05 MED ORDER — LACTATED RINGERS IV SOLN: INTRAVENOUS | Status: AC

## 2022-01-05 MED ORDER — ONDANSETRON HCL 4 MG/2ML IJ SOLN: 4.0000 mg | Freq: Four times a day (QID) | INTRAMUSCULAR | Status: DC | PRN

## 2022-01-05 MED ORDER — ALPRAZOLAM 0.25 MG PO TABS
0.2500 mg | ORAL_TABLET | Freq: Every evening | ORAL | Status: DC | PRN
  Administered 2022-01-08 – 2022-01-13 (×6): 0.25 mg via ORAL
  Filled 2022-01-05 (×5): qty 1

## 2022-01-05 MED ORDER — PANTOPRAZOLE SODIUM 40 MG IV SOLR
40.0000 mg | INTRAVENOUS | Status: DC
Start: 2022-01-05 — End: 2022-01-11
  Administered 2022-01-05 – 2022-01-10 (×6): 40 mg via INTRAVENOUS
  Filled 2022-01-05 (×6): qty 10

## 2022-01-05 MED ORDER — ATORVASTATIN CALCIUM 80 MG PO TABS
80.0000 mg | ORAL_TABLET | Freq: Every day | ORAL | Status: DC
  Administered 2022-01-05 – 2022-01-15 (×11): 80 mg via ORAL
  Filled 2022-01-05 (×3): qty 1
  Filled 2022-01-05: qty 4
  Filled 2022-01-05: qty 1
  Filled 2022-01-05 (×2): qty 4
  Filled 2022-01-05: qty 1
  Filled 2022-01-05 (×3): qty 4
  Filled 2022-01-05: qty 1

## 2022-01-05 MED ORDER — ACETAMINOPHEN 325 MG PO TABS
650.0000 mg | ORAL_TABLET | Freq: Once | ORAL | Status: AC
  Administered 2022-01-05: 650 mg via ORAL
  Filled 2022-01-05: qty 2

## 2022-01-05 MED ORDER — MAGNESIUM GLUCONATE 500 MG PO TABS
250.0000 mg | ORAL_TABLET | Freq: Every day | ORAL | Status: DC
  Administered 2022-01-05 – 2022-01-14 (×9): 250 mg via ORAL
  Filled 2022-01-05 (×12): qty 1

## 2022-01-05 MED ORDER — PANTOPRAZOLE SODIUM 40 MG PO TBEC
40.0000 mg | DELAYED_RELEASE_TABLET | Freq: Every day | ORAL | Status: DC
Start: 2022-01-05 — End: 2022-01-05

## 2022-01-05 MED ORDER — PREDNISONE 10 MG PO TABS: 10.0000 mg | ORAL_TABLET | Freq: Every day | ORAL | Status: DC

## 2022-01-05 MED ORDER — TICAGRELOR 60 MG PO TABS: 60.0000 mg | ORAL_TABLET | Freq: Two times a day (BID) | ORAL | Status: DC

## 2022-01-05 MED ORDER — AMLODIPINE BESYLATE 5 MG PO TABS
5.0000 mg | ORAL_TABLET | Freq: Every day | ORAL | Status: DC
  Administered 2022-01-05 – 2022-01-15 (×11): 5 mg via ORAL
  Filled 2022-01-05 (×11): qty 1

## 2022-01-05 MED ORDER — ACETAMINOPHEN 650 MG RE SUPP: 650.0000 mg | Freq: Four times a day (QID) | RECTAL | Status: DC | PRN

## 2022-01-05 MED ORDER — FOLIC ACID 1 MG PO TABS
1.0000 mg | ORAL_TABLET | Freq: Every day | ORAL | Status: DC
  Administered 2022-01-05 – 2022-01-15 (×11): 1 mg via ORAL
  Filled 2022-01-05 (×11): qty 1

## 2022-01-05 MED ORDER — TICAGRELOR 90 MG PO TABS
90.0000 mg | ORAL_TABLET | Freq: Two times a day (BID) | ORAL | Status: DC
  Administered 2022-01-06 – 2022-01-15 (×19): 90 mg via ORAL
  Filled 2022-01-05 (×20): qty 1

## 2022-01-05 MED ORDER — ONDANSETRON HCL 4 MG PO TABS: 4.0000 mg | ORAL_TABLET | Freq: Four times a day (QID) | ORAL | Status: DC | PRN

## 2022-01-05 NOTE — Assessment & Plan Note (Signed)
Patient with a history of DVT on apixaban We will hold apixaban for now due to symptomatic anemia Vascular surgery consult requested for recommendation for further anticoagulation versus IVC filter insertion

## 2022-01-05 NOTE — Assessment & Plan Note (Signed)
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

## 2022-01-05 NOTE — H&P (Signed)
, History and Physical    Patient: Andrea Williamson XBL:[id number] DOB: 14-Apr-1951 DOA: 01/05/2022 DOS: the patient was seen and examined on 01/05/2022 PCP: [name], MD  Patient coming from: Home  Chief Complaint:  Chief Complaint  Patient presents with   Dizziness   Weakness    HPI: Andrea Williamson is a 71 y.o. 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  Patient states that she had some epistaxis at home and it only happened when she blew her nose but that has resolved.  She is unable to tell me if she has had any bloody stools or melena but per the ER provider  She had dark stools. She denies having any hematemesis, no cough, no abdominal pain, no chest pain, no shortness of breath, no nausea, no vomiting, no headache.  She has no urinary symptoms She did have a fever in the ER with a Tmax of 102.5. Review of Systems: As mentioned in the history of present illness. All other systems reviewed and are negative. Past Medical History:  Diagnosis Date   Deep vein thrombosis (DVT) ([place])    Hypertension    Stroke [facility])    Past Surgical History:  Procedure Laterality Date   ANKLE SURGERY Left 2008   IR CT HEAD LTD  10/05/2021   IR INTRA CRAN STENT  10/01/2021   IR PERCUTANEOUS ART THROMBECTOMY/INFUSION INTRACRANIAL INC DIAG ANGIO  10/01/2021   IR US GUIDE VASC ACCESS RIGHT  10/01/2021   RADIOLOGY WITH ANESTHESIA N/A 10/01/2021   Procedure: IR WITH ANESTHESIA;  Surgeon: Radiologist, Medication, MD;  Location: [place];  Service: Radiology;  Laterality: N/A;   Social History:  reports that she quit smoking about 3  months ago. Her smoking use included cigarettes. She has a 75.00 pack-year smoking history. She has never used smokeless tobacco. She reports that she does not currently use alcohol. She reports that she does not currently use drugs.  No Known Allergies  Family History  Problem Relation Age of Onset   COPD Mother    Cancer Mother     Prior to Admission medications   Medication Sig Start Date End Date Taking? Authorizing Provider  ALPRAZolam (XANAX) 0.25 MG tablet Take 1 tablet (0.25 mg total) by mouth at bedtime as needed for anxiety. Take 1/2 tablet at bedtime for difficulty sleeping. 12/22/21   Cletis Athens, MD  amLODipine (NORVASC) 5 MG tablet Take 1 tablet (5 mg total) by mouth daily. 11/23/21   Cletis Athens, MD  apixaban (ELIQUIS) 5 MG TABS tablet Take 1 tablet (5 mg total) by mouth 2 (two) times daily. 11/20/21   Samuella Cota, MD  atorvastatin (LIPITOR) 80 MG tablet Take 1 tablet (80 mg total) by mouth daily. 11/23/21   Cletis Athens, MD  ferrous sulfate 325 (65 FE) MG tablet Take 325 mg by mouth daily with breakfast.    [provider]  folic acid (FOLVITE) 1 MG tablet Take 1 tablet (1 mg total) by mouth daily. 11/21/21   Samuella Cota, MD  hydrALAZINE (APRESOLINE) 25 MG tablet Take 1 tablet (25 mg total) by mouth every 8 (eight) hours as needed (SBP >170 or DBP >  110). 11/13/21   [place] Donnamarie Poag, DO  magnesium gluconate (MAGONATE) 500 MG tablet Take 0.5 tablets (250 mg total) by mouth at bedtime. 10/16/21   Love, Ivan Anchors, PA-C  pantoprazole (PROTONIX) 40 MG tablet Take 1 tablet (40 mg total) by mouth daily. 11/20/21 11/20/22  Samuella Cota, MD  predniSONE (DELTASONE) 10 MG tablet Take 1 tablet (10 mg total) by mouth daily with breakfast. 7 pills daily x 4 days, then 6 pills daily x 4 days, 5 pills daily x4 days, 4 pills x 4 days, 3 pills x 4 days, 2 pills x 4 days, then 1 pill x 4 days and then stop 12/02/21   [name], MD  ticagrelor (BRILINTA) 60 MG TABS tablet Take 1  tablet (60 mg total) by mouth 2 (two) times daily. 12/22/21   Cletis Athens, MD    Physical Exam: Vitals:   01/05/22 0901 01/05/22 0902 01/05/22 1104 01/05/22 1200  BP:   129/67 (!) 115/57  Pulse: 89 89 78 73  Resp: (!) 25 18 (!) 25 (!) 21  Temp:   99.1 F (37.3 C)   TempSrc:   Oral   SpO2: 97% 97% 97% 96%  Weight:      Height:       Physical Exam Vitals and nursing note reviewed.  Constitutional:      Appearance: Normal appearance. She is normal weight.     Comments: Felt warm to touch  HENT:     Head: Normocephalic and atraumatic.     Comments: Pale conjunctiva    Nose: Nose normal.     Mouth/Throat:     Mouth: Mucous membranes are moist.  Eyes:     Comments: Conjunctival pallor  Cardiovascular:     Rate and Rhythm: Normal rate and regular rhythm.  Pulmonary:     Effort: Pulmonary effort is normal.     Breath sounds: Normal breath sounds.  Abdominal:     General: Abdomen is flat.     Palpations: Abdomen is soft.  Musculoskeletal:        General: Normal range of motion.     Cervical back: Normal range of motion and neck supple.  Skin:    General: Skin is warm and dry.  Neurological:     General: No focal deficit present.     Mental Status: She is alert and oriented to person, place, and time.  Psychiatric:        Mood and Affect: Mood normal.        Behavior: Behavior normal.     Data Reviewed: Relevant notes from primary care and specialist visits, past discharge summaries as available in EHR, including Care Everywhere. Prior diagnostic testing as pertinent to current admission diagnoses Updated medications and problem lists for reconciliation ED course, including vitals, labs, imaging, treatment and response to treatment Triage notes, nursing and pharmacy notes and ED provider's notes Notable results as noted in HPI Labs reviewed patient's hemoglobin dropped from 11.7 to 8.1 2 weeks, lactic acid 2.0, LDH 248, BUN 33, creatinine 1.65 Peripheral smear is  unremarkable Chest x-ray reviewed by me shows no evidence of acute cardiopulmonary disease Twelve-lead EKG reviewed by me shows sinus tachycardia with nonspecific ST and T wave abnormalities There are no new results to review at this time.  Assessment and Plan: * Symptomatic anemia- (present on admission) Patient presents to the ER for evaluation of fatigue, dizziness and lightheadedness. She has had a 3 g drop in her H&H over the last  3 weeks 11.7 >. 8.1 Unclear etiology but patient did have epistaxis prior to her admission Per ER provider her stools have been dark We will start patient on Protonix We will request GI consult Patient has a history of known autoimmune hemolytic anemia but lab panel does not show any evidence of hemolysis at this time. I have requested hematology consultation  Fever- (present on admission) Patient has a Tmax of 102 No obvious etiology for her fever at this time Respiratory viral panel is negative Follow-up results of blood cultures  DVT (deep venous thrombosis) ([place])- (present on admission) Patient with a history of DVT on apixaban We will hold apixaban for now due to symptomatic anemia Vascular surgery consult requested for recommendation for further anticoagulation versus IVC filter insertion  Warm autoimmune hemolytic anemia ([place])- (present on admission) Patient has a history of warm autoimmune hemolytic anemia She is currently on rituximab No evidence of hemolysis at this time  Essential hypertension- (present on admission) Patient is normotensive We will hold amlodipine for now  History of stroke History of prior CVA due to left M2 occlusion status post thrombectomy with T1c13 revascularization and left A2 stenting Patient was on Brilinta and aspirin but is currently on Brilinta and Eliquis Anticoagulant therapy is currently on hold due to symptomatic anemia We will request neurology consult       Advance Care Planning:   Code  Status: Full Code   Consults: GI/vascular surgery/neurology/hematology  Family Communication: Greater than 50% of the time was spent discussing patient's condition and plan of care with her at the bedside.  All questions and concerns have been addressed.  She verbalizes understanding and agrees with the plan  Severity of Illness: The appropriate patient status for this patient is INPATIENT. Inpatient status is judged to be reasonable and necessary in order to provide the required intensity of service to ensure the patient's safety. The patient's presenting symptoms, physical exam findings, and initial radiographic and laboratory data in the context of their chronic comorbidities is felt to place them at high risk for further clinical deterioration. Furthermore, it is not anticipated that the patient will be medically stable for discharge from the hospital within 2 midnights of admission.   * I certify that at the point of admission it is my clinical judgment that the patient will require inpatient hospital care spanning beyond 2 midnights from the point of admission due to high intensity of service, high risk for further deterioration and high frequency of surveillance required.*  Author: Collier Bullock, MD 01/05/2022 12:16 PM  For on call review [email or web address].

## 2022-01-05 NOTE — Assessment & Plan Note (Signed)
Patient is normotensive We will hold amlodipine for now

## 2022-01-05 NOTE — ED Provider Notes (Signed)
[facility] Provider Note    Event Date/Time   First MD Initiated Contact with Patient 01/05/22 [phone]     (approximate)   History   Dizziness and Weakness   HPI  Andrea Williamson is a 71 y.o. 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  Patient states she had a right-sided bloody nose today earlier.  She denies any fevers, cough, chest pain, abdominal pain, vomiting, diarrhea, urinary symptoms rash or focal extremity pain.  No new weakness numbness tingling headache vertigo or other clear associated sick symptoms.  She has had decreased appetite.      Physical Exam  Triage Vital Signs: ED Triage Vitals  Enc Vitals Group     BP 01/04/22 1852 (!) 145/67     Pulse Rate 01/04/22 1852 82     Resp 01/04/22 1852 18     Temp 01/04/22 1852 98.4 F (36.9 C)     Temp Source 01/04/22 1852 Oral     SpO2 01/04/22 1852 100 %     Weight 01/04/22 1830 161 lb 9.6 oz (73.3 kg)     Height 01/04/22 1830 5\' 2"  (1.575 m)     Head Circumference --      Peak Flow --      Pain Score 01/04/22 1830 0     Pain Loc --      Pain Edu? --      Excl. in [place]? --     Most recent vital signs: Vitals:   01/05/22 0600 01/05/22 0730  BP: (!) 146/74 (!) 157/85  Pulse: 65 77  Resp: (!) 21 (!) 23  Temp:    SpO2: 97% 92%    General: Awake, no distress.  CV:  Slightly prolonged capillary refill.  No significant murmur at this time. Resp:  Normal effort.  Clear bilaterally Abd:  No distention.  Soft throughout. Other:  PERRLA.  EOMI.  Symmetric strength in the bilateral upper and lower extremities.  Sensation is  intact to light touch in all extremities.   ED Results / Procedures / Treatments  Labs (all labs ordered are listed, but only abnormal results are displayed) Labs Reviewed  BASIC METABOLIC PANEL - Abnormal; Notable for the following components:      Result Value   Sodium 131 (*)    CO2 21 (*)    Glucose, Bld 109 (*)    BUN 33 (*)    Creatinine, Ser 1.65 (*)    GFR, Estimated 33 (*)    All other components within normal limits  CBC - Abnormal; Notable for the following components:   RBC 2.60 (*)    Hemoglobin 9.2 (*)    HCT 27.5 (*)    MCV 105.8 (*)    MCH 35.4 (*)    All other components within normal limits  URINALYSIS, ROUTINE W REFLEX MICROSCOPIC - Abnormal; Notable for the following components:   Color, Urine YELLOW (*)    APPearance CLEAR (*)    Hgb urine dipstick SMALL (*)    Bacteria, UA RARE (*)    All other components within normal limits  PROTIME-INR - Abnormal; Notable for the following  components:   Prothrombin Time 17.4 (*)    INR 1.4 (*)    All other components within normal limits  RETICULOCYTES - Abnormal; Notable for the following components:   RBC. 2.59 (*)    Immature Retic Fract 34.7 (*)    All other components within normal limits  LACTATE DEHYDROGENASE - Abnormal; Notable for the following components:   LDH 248 (*)    All other components within normal limits  HEPATIC FUNCTION PANEL - Abnormal; Notable for the following components:   Total Protein 6.4 (*)    Albumin 3.3 (*)    All other components within normal limits  TROPONIN I (HIGH SENSITIVITY) - Abnormal; Notable for the following components:   Troponin I (High Sensitivity) 25 (*)    All other components within normal limits  TROPONIN I (HIGH SENSITIVITY) - Abnormal; Notable for the following components:   Troponin I (High Sensitivity) 22 (*)    All other components within normal limits  RESP PANEL BY RT-PCR (FLU A&B, COVID) ARPGX2  PATHOLOGIST SMEAR REVIEW  HAPTOGLOBIN  CBG MONITORING,  ED  TYPE AND SCREEN     EKG  ECG was remarkable for sinus tachycardia with a ventricular rate of 103 and some nonspecific ST changes versus artifact in the inferior leads and lateral lead aVL without other clear evidence of acute ischemia or significant arrhythmia.   RADIOLOGY    PROCEDURES:  Critical Care performed: No  .1-3 Lead EKG Interpretation Performed by: Lucrezia Starch, MD Authorized by: Lucrezia Starch, MD     Interpretation: normal     ECG rate assessment: normal     Rhythm: sinus rhythm     Ectopy: none     Conduction: normal    The patient is on the cardiac monitor to evaluate for evidence of arrhythmia and/or significant heart rate changes.   MEDICATIONS ORDERED IN ED: Medications  lactated ringers bolus 1,000 mL (0 mLs Intravenous Stopped 01/05/22 0410)     IMPRESSION / MDM / ASSESSMENT AND PLAN / ED COURSE  I reviewed the triage vital signs and the nursing notes.                              Differential diagnosis includes, but is not limited to acute infectious process such as a cystitis or COVID or influenza, anemia, metabolic derangements, arrhythmia with lower suspicion at this time based on history and exam for CVA, trauma, polypharmacy or drug withdrawal.  Patient does appear dehydrated.  ECG was remarkable for sinus tachycardia with a ventricular rate of 103 and some nonspecific ST changes versus artifact in the inferior leads and lateral lead aVL without other clear evidence of acute ischemia or significant arrhythmia.  Troponin minimally elevated and downtrending at 25 and 22 without any associated chest pain or nonsuggestive ACS and more suggestive of some very mild demand ischemia.  BMP shows a evidence of a AKI with a creatinine of 1.65 compared to 1.1 913 days ago.  No other significant electrolyte metabolic derangements although patient is slightly hyponatremic with a sodium of 131.  CBC shows no leukocytosis but some acute anemia with  hemoglobin 9.2 compared to 11.713 days ago.  Platelets are normal.  INR is 1.4.  LDH is 248 corrected to 5113 days ago.  [place] function panel is unremarkable for any elevation of bilirubin and overall this is less suggestive of an acute hemolytic process.  COVID influenza PCR negative.  UA is not suggestive of cystitis.  Given patient is on Eliquis and Brilinta he reports dark stools which could be from her iron I am also concerned about possible occult GI bleed.  Given her age and the fact that she is anticoagulated with acute drop in hemoglobin I think it is reasonable to admit for further evaluation and management of this.  In addition she was started IV fluids for some dehydration which I think is likely the cause of her AKI.      FINAL CLINICAL IMPRESSION(S) / ED DIAGNOSES   Final diagnoses:  AKI (acute kidney injury) ([place])  Weak  Anemia, unspecified type  Anticoagulated     Rx / DC Orders   ED Discharge Orders     None        Note:  This document was prepared using Dragon voice recognition software and may include unintentional dictation errors.   Lucrezia Starch, MD 01/05/22 [phone]

## 2022-01-05 NOTE — Progress Notes (Signed)
Charge RN to draw STAT Blood cultures.

## 2022-01-05 NOTE — Assessment & Plan Note (Signed)
Patient presents to the ER for evaluation of fatigue, dizziness and lightheadedness. She has had a 3 g drop in her H&H over the last 3 weeks 11.7 >. 8.1 Unclear etiology but patient did have epistaxis prior to her admission Per ER provider her stools have been dark We will start patient on Protonix We will request GI consult Patient has a history of known autoimmune hemolytic anemia but lab panel does not show any evidence of hemolysis at this time. I have requested hematology consultation

## 2022-01-05 NOTE — Assessment & Plan Note (Signed)
Patient has a Tmax of 102 No obvious etiology for her fever at this time Respiratory viral panel is negative Follow-up results of blood cultures

## 2022-01-05 NOTE — Consult Note (Signed)
Lucilla Lame, MD [facility]  [address] [place], [place] [address] Phone: [phone] Fax : [phone]  Consultation  Referring Provider:     Dr. Francine Graven Primary Care Physician:  [name], MD Primary Gastroenterologist:  Althia Forts        Reason for Consultation:     Anemia  Date of Admission:  01/05/2022 Date of Consultation:  01/05/2022         HPI:   Andrea Williamson is a 71 y.o. female who I came to see today and appears very agitated. Upon entering the room and introduced myself she started to say that she didn't want answer questions and that she had told her story to so many people already.  I explained to her that I was the gastroenterologist and was concerned that she may be losing blood.  The patient reports that she had nosebleeding that went on for a full day with what she describes as a large amount of blood.  The patient has been on antibiotics and for bilateral lower extremity DVTs and was also treated in the past for autoimmune hemolytic anemia.  The patient had presented to the emergency room with 3-4 days of generalized weakness and lightheadedness.  The patient denies any nausea or vomiting, black stools or bloody stools.  She does report that she has lost 8 pounds in the last few days. It is reported that the patient also has had a stroke in November with endovascular intervention that was complicated by focal dissection requiring stenting. The patient's most recent lab work showed:  Component     Latest Ref Rng & Units 12/23/2021 01/04/2022 01/05/2022 01/05/2022           9:26 AM  6:06 PM  Hemoglobin     12.0 - 15.0 g/dL 11.7 (L) 9.2 (L) 8.1 (L) 7.8 (L)  HCT     36.0 - 46.0 % 35.7 (L) 27.5 (L) 24.2 (L) 23.0 (L)   Her liver enzymes were normal.She also has had an elevated MCV with her MCV this morning of 106. The patient has had B12 iron and folate sent off.  Past Medical History:  Diagnosis Date   Deep vein thrombosis (DVT) ([place])    Hypertension    Stroke  [facility])     Past Surgical History:  Procedure Laterality Date   ANKLE SURGERY Left 2008   IR CT HEAD LTD  10/05/2021   IR INTRA CRAN STENT  10/01/2021   IR PERCUTANEOUS ART THROMBECTOMY/INFUSION INTRACRANIAL INC DIAG ANGIO  10/01/2021   IR US GUIDE VASC ACCESS RIGHT  10/01/2021   RADIOLOGY WITH ANESTHESIA N/A 10/01/2021   Procedure: IR WITH ANESTHESIA;  Surgeon: Radiologist, Medication, MD;  Location: [facility];  Service: Radiology;  Laterality: N/A;    Prior to Admission medications   Medication Sig Start Date End Date Taking? Authorizing Provider  ALPRAZolam (XANAX) 0.25 MG tablet Take 1 tablet (0.25 mg total) by mouth at bedtime as needed for anxiety. Take 1/2 tablet at bedtime for difficulty sleeping. Patient taking differently: Take 0.125 mg by mouth at bedtime. 12/22/21  Yes Masoud, Viann Shove, MD  amLODipine (NORVASC) 5 MG tablet Take 1 tablet (5 mg total) by mouth daily. 11/23/21  Yes Masoud, Viann Shove, MD  apixaban (ELIQUIS) 5 MG TABS tablet Take 1 tablet (5 mg total) by mouth 2 (two) times daily. 11/20/21  Yes Samuella Cota, MD  atorvastatin (LIPITOR) 80 MG tablet Take 1 tablet (80 mg total) by mouth daily. 11/23/21  Yes Cletis Athens, MD  ferrous sulfate 325 (65 FE) MG tablet Take 325 mg by mouth daily with breakfast.   Yes [provider]  folic acid (FOLVITE) 1 MG tablet Take 1 tablet (1 mg total) by mouth daily. 11/21/21  Yes Samuella Cota, MD  hydrALAZINE (APRESOLINE) 25 MG tablet Take 1 tablet (25 mg total) by mouth every 8 (eight) hours as needed (SBP >170 or DBP >110). 11/13/21  Yes [place] Eric J, DO  magnesium gluconate (MAGONATE) 500 MG tablet Take 0.5 tablets (250 mg total) by mouth at bedtime. 10/16/21  Yes Love, Ivan Anchors, PA-C  pantoprazole (PROTONIX) 40 MG tablet Take 1 tablet (40 mg total) by mouth daily. 11/20/21 11/20/22 Yes Samuella Cota, MD  ticagrelor (BRILINTA) 60 MG TABS tablet Take 1 tablet (60 mg total) by mouth 2 (two) times daily. 12/22/21  Yes Masoud, Viann Shove,  MD  predniSONE (DELTASONE) 10 MG tablet Take 1 tablet (10 mg total) by mouth daily with breakfast. 7 pills daily x 4 days, then 6 pills daily x 4 days, 5 pills daily x4 days, 4 pills x 4 days, 3 pills x 4 days, 2 pills x 4 days, then 1 pill x 4 days and then stop Patient not taking: Reported on 01/05/2022 12/02/21   [name], MD    Family History  Problem Relation Age of Onset   COPD Mother    Cancer Mother      Social History   Tobacco Use   Smoking status: Former    Packs/day: 1.50    Years: 50.00    Pack years: 75.00    Types: Cigarettes    Quit date: 09/2021    Years since quitting: 0.3   Smokeless tobacco: Never  Vaping Use   Vaping Use: Never used  Substance Use Topics   Alcohol use: Not Currently   Drug use: Not Currently    Allergies as of 01/04/2022   (No Known Allergies)    Review of Systems:    All systems reviewed and negative except where noted in HPI.   Physical Exam:  Vital signs in last 24 hours: Temp:  [98.5 F (36.9 C)-102.1 F (38.9 C)] 100.2 F (37.9 C) (02/21 1618) Pulse Rate:  [65-98] 83 (02/21 1618) Resp:  [16-31] 19 (02/21 1618) BP: (113-157)/(57-100) 157/77 (02/21 1618) SpO2:  [92 %-98 %] 98 % (02/21 1618)   General:   Pleasant, cooperative in NAD Head:  Normocephalic and atraumatic. Eyes:   No icterus.   Conjunctiva pink. PERRLA. Ears:  Normal auditory acuity. Neck:  Supple; no masses or thyroidomegaly Lungs: Respirations even and unlabored. Lungs clear to auscultation bilaterally.   No wheezes, crackles, or rhonchi.  Heart:  Regular rate and rhythm;  Without murmur, clicks, rubs or gallops Abdomen:  Soft, nondistended, nontender. Normal bowel sounds. No appreciable masses or hepatomegaly.  No rebound or guarding.  Rectal:  Not performed. Msk:  Symmetrical without gross deformities.    Extremities:  Without edema, cyanosis or clubbing. Neurologic:  Alert and oriented x3;  grossly normal neurologically Except for tremor Skin:   Intact without significant lesions or rashes. Cervical Nodes:  No significant cervical adenopathy. Psych:  Alert and cooperative. Normal affect.  LAB RESULTS: Recent Labs    01/04/22 1833 01/05/22 0926 01/05/22 1806  WBC 9.6 9.3  --   HGB 9.2* 8.1* 7.8*  HCT 27.5* 24.2* 23.0*  PLT 310 291  --    BMET Recent Labs    01/04/22 1833  NA 131*  K 3.8  CL 100  CO2 21*  GLUCOSE 109*  BUN 33*  CREATININE 1.65*  CALCIUM 8.9   LFT Recent Labs    01/05/22 0253  PROT 6.4*  ALBUMIN 3.3*  AST 26  ALT 32  ALKPHOS 49  BILITOT 1.0  BILIDIR 0.1  IBILI 0.9   PT/INR Recent Labs    01/04/22 1833  LABPROT 17.4*  INR 1.4*    STUDIES: DG Chest Port 1 View  Result Date: 01/05/2022 CLINICAL DATA:  Weakness and lightheadedness. EXAM: PORTABLE CHEST 1 VIEW COMPARISON:  CT of the chest on 11/13/2021 FINDINGS: The heart size and mediastinal contours are within normal limits. Mild pulmonary interstitial prominence without overt edema, pleural fluid or pneumothorax. Mild underlying pulmonary interstitial edema cannot be excluded. There is atelectasis at both lung bases, right greater than left. The visualized skeletal structures are unremarkable. IMPRESSION: Mild pulmonary interstitial prominence may represent mild underlying pulmonary interstitial edema. Bibasilar atelectasis, right greater than left. Electronically Signed   By: Aletta Edouard M.D.   On: 01/05/2022 09:07      Impression / Plan:   Assessment: Principal Problem:   Symptomatic anemia Active Problems:   History of stroke   Essential hypertension   Warm autoimmune hemolytic anemia (HCC)   DVT (deep venous thrombosis) (HCC)   Fever   Andrea Williamson is a 71 y.o. y/o female with anemia and nose bleeding without any report of any sign of GI bleeding.  The patient has had a steady drop in her hemoglobin from 13 days ago which was 11.7 but her previous labs were hovering between 7 and 10.  The patient has no abdominal pain or  change in bowel habits and denies any black stools or bloody stools.  She is being evaluated by hematology and has labs pending including iron studies B12 and folate.  The patient reports that she believes her low blood count is caused by her nosebleeds.  Plan:  The patient has been on anticoagulation and is no sign of any GI bleeding at the present time.  The iron studies and folate and B12 are pending and I will await those results.  The patient denies any history of having an EGD or colonoscopy in the past. The patient would be high risk for any endoscopic procedure while on multiple anticoagulants and it would be very unlikely to stop any active bleeding.  I'm not sure that this patient's anemia is attributable a significant GI bleed without any sign of any visible blood loss from her GI tract. If the patient should show any sign of GI bleeding then we can reconsider a GI workup.  Thank you for involving me in the care of this patient.      LOS: 0 days   Lucilla Lame, MD, [facility] 01/05/2022, 7:26 PM,  Pager [phone] 7am-5pm  Check AMION for 5pm -7am coverage and on weekends   Note: This dictation was prepared with Dragon dictation along with smaller phrase technology. Any transcriptional errors that result from this process are unintentional.

## 2022-01-05 NOTE — Progress Notes (Signed)
Date and time results received: 01/05/22 1003 (use smartphrase ".now" to insert current time)  Test: lactic acid Critical Value: 2.0  Name of Provider Notified: Agbata  Orders Received? Or Actions Taken?: Orders Received - See Orders for details and Actions Taken:    MD to order IV fluids.

## 2022-01-05 NOTE — Consult Note (Signed)
Neurology Consultation Reason for Consult: Intracranial Stent Referring Physician: Agbata, T  CC: Nosebleed  History is obtained from: patient   HPI: Andrea Williamson is a 71 y.o. female with a complicated recent course who had an ACA stroke in November which was treated with endovascular intervention which was complicated by focal dissection requiring stenting.  Following this, she also developed DVT, and was started on anticoagulation with Eliquis.  She has been on aspirin and Brilinta initially, but this was held for a time and then she was restarted on Brilinta.  She developed an autoimmune hemolytic anemia requiring steroids and rituximab.  Over the past week she has had frequent clots from her nose.  She denies any frank red blood, and only some bloody mucus, not frank blood going postnasal either.  She denies any change in her neurological symptoms.   ROS: A 14 point ROS was performed and is negative except as noted in the HPI.  Past Medical History:  Diagnosis Date   Deep vein thrombosis (DVT) (HCC)    Hypertension    Stroke [facility])      Family History  Problem Relation Age of Onset   COPD Mother    Cancer Mother      Social History:  reports that she quit smoking about 3 months ago. Her smoking use included cigarettes. She has a 75.00 pack-year smoking history. She has never used smokeless tobacco. She reports that she does not currently use alcohol. She reports that she does not currently use drugs.   Exam: Current vital signs: BP (!) 115/57    Pulse 73    Temp 99.1 F (37.3 C) (Oral)    Resp (!) 21    Ht 5\' 2"  (1.575 m)    Wt 73.3 kg    SpO2 96%    BMI 29.56 kg/m  Vital signs in last 24 hours: Temp:  [98.4 F (36.9 C)-102.1 F (38.9 C)] 99.1 F (37.3 C) (02/21 1104) Pulse Rate:  [65-98] 73 (02/21 1200) Resp:  [16-31] 21 (02/21 1200) BP: (113-157)/(57-100) 115/57 (02/21 1200) SpO2:  [92 %-100 %] 96 % (02/21 1200) Weight:  [73.3 kg] 73.3 kg (02/20  1830)   Physical Exam  Constitutional: Appears well-developed and well-nourished.  Psych: Affect appropriate to situation Eyes: No scleral injection HENT: No OP obstruction MSK: no joint deformities.  Cardiovascular: Normal rate and regular rhythm.  Respiratory: Effort normal, non-labored breathing GI: Soft.  No distension. There is no tenderness.  Skin: WDI  Neuro: Mental Status: Patient is awake, alert, oriented to person, place, month, year, and situation. Patient is able to give a clear and coherent history. She has mild increased latency of response, but is able to communicate well Cranial Nerves: II: Visual Fields are full. Pupils are equal, round, and reactive to light.   III,IV, VI: EOMI without ptosis or diploplia.  V: Facial sensation is symmetric to temperature VII: Facial movement with mild flattening of right nasolabial fold VIII: hearing is intact to voice X: Uvula elevates symmetrically XI: Shoulder shrug is symmetric. XII: tongue is midline without atrophy or fasciculations.  Motor: Tone is normal. Bulk is normal. 5/5 strength was present on the left, she has no drift in either upper extremity but to confrontation?  Mild right arm weakness, 4/5 in the right lower extremity, 5/5 in the left lower extremity Sensory: Sensation is symmetric to light touch and temperature in the arms and legs. Cerebellar: She has significant intentional tremor bilaterally.     I have  reviewed labs in epic and the results pertinent to this consultation are: Hemoglobin is dropped from 11.7-8 0.1 over 13 days  I have reviewed the images obtained: CT head-negative from 12/29  Impression: 71 year old female with 30-month old intracranial stent presenting with epistaxis with significant hemoglobin drop.  It is unclear to me if the epistaxis is the etiology of her hemoglobin drop.  I agree with holding anticoagulation, I would be hesitant to completely stop her antiplatelet therapy given  the risk of in-stent thrombosis and occlusion. If her hemoglobin does continue to drop, then this may be unavoidable, but I Woud favor trying to not hold it unless absolutely necessary   Recommendations: 1) continue Brilinta 90 mg twice daily 2) work-up of anemia per IM   Roland Rack, MD [facility] [phone]  If 7pm- 7am, please page neurology on call as listed in [place].

## 2022-01-05 NOTE — Assessment & Plan Note (Signed)
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

## 2022-01-05 NOTE — Consult Note (Addendum)
Hematology/Oncology Consult note [facility] [phone] [phone]  Patient Care Team: [name], MD as PCP - General (Oncology) [name], MD as Consulting Physician (Hematology and Oncology)   Name of the patient: Andrea Williamson  [id number]  15-Mar-1951    Reason for consult: Anemia   Requesting physician: Dr. Damita Dunnings  Date of visit: 01/05/2022   History of presenting illness- Patient is a 71 year old female diagnosed with stroke in November and was on aspirin and Brilinta.  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  Dose 4 of Rituxan was given on 12/09/2021.  Steroid taper was started following that.  Patient also noted to have bilateral lower extremity DVT during her diagnosis of autoimmune hemolytic anemia for which she was started on Eliquis.  Patient therefore has been on 3 different anticoagulants including aspirin and Brilinta for stroke as well as Eliquis for DVT.  Patient presented to the ER with worsening nosebleed and was found to have a drop in her hemoglobinFrom 11.72 weeks ago to 8.1.  Also noted to have AKI with a serum creatinine that had worsened to 1.6 as compared to 1.13 weeks ago.  Hematology consulted for anemia.  Patient denies any blood in her stool or urine.  Denies any other sites of bleeding other than nosebleed.  Patient had not started taking Brilinta until about a week or 10 days ago when she started feeling more dizzy and had a fall at home followed by nosebleeds  ECOG PS- 1   Review of systems- Review of Systems  Constitutional:  Negative for chills, fever, malaise/fatigue and weight loss.  HENT:  Positive for nosebleeds. Negative for congestion and ear discharge.   Eyes:  Negative for blurred vision.  Respiratory:  Negative for cough, hemoptysis, sputum production, shortness of breath and wheezing.    Cardiovascular:  Negative for chest pain, palpitations, orthopnea and claudication.  Gastrointestinal:  Negative for abdominal pain, blood in stool, constipation, diarrhea, heartburn, melena, nausea and vomiting.  Genitourinary:  Negative for dysuria, flank pain, frequency, hematuria and urgency.  Musculoskeletal:  Negative for back pain, joint pain and myalgias.  Skin:  Negative for rash.  Neurological:  Negative for dizziness, tingling, focal weakness, seizures, weakness and headaches.  Endo/Heme/Allergies:  Does not bruise/bleed easily.  Psychiatric/Behavioral:  Negative for depression and suicidal ideas. The patient does not have insomnia.    No Known Allergies  Patient Active Problem List   Diagnosis Date Noted   Fever 01/05/2022   History of stroke 11/13/2021   Essential hypertension 11/13/2021   DVT (deep venous thrombosis) ([place]) 11/13/2021   Adnexal mass 11/13/2021   Warm autoimmune hemolytic anemia (HCC)    Symptomatic anemia 11/12/2021   HTN (hypertension) 10/19/2021   Constipation 10/19/2021   Elevated cholesterol 10/15/2021   Acute renal injury ([place]) 10/15/2021   Acute ischemic left ACA stroke ([place]) 10/07/2021   Stroke (cerebrum) ([place]) 10/01/2021     Past Medical History:  Diagnosis Date   Deep vein thrombosis (DVT) ([place])    Hypertension    [facility]      Past Surgical History:  Procedure Laterality Date   ANKLE SURGERY Left 2008   IR CT HEAD LTD  10/05/2021   IR INTRA CRAN STENT  10/01/2021   IR PERCUTANEOUS ART THROMBECTOMY/INFUSION INTRACRANIAL INC DIAG ANGIO  10/01/2021   IR US GUIDE VASC ACCESS RIGHT  10/01/2021   RADIOLOGY  WITH ANESTHESIA N/A 10/01/2021   Procedure: IR WITH ANESTHESIA;  Surgeon: Radiologist, Medication, MD;  Location: [place];  Service: Radiology;  Laterality: N/A;    Social History   Socioeconomic History   Marital status: Married    Spouse name: Not on file   Number of children: 3   Years of education: 12   Highest  education level: 12th grade  Occupational History   Occupation: Retired  Tobacco Use   Smoking status: Former    Packs/day: 1.50    Years: 50.00    Pack years: 75.00    Types: Cigarettes    Quit date: 09/2021    Years since quitting: 0.3   Smokeless tobacco: Never  Vaping Use   Vaping Use: Never used  Substance and Sexual Activity   Alcohol use: Not Currently   Drug use: Not Currently   Sexual activity: Not Currently  Other Topics Concern   Not on file  Social History Narrative   October 2022 stopped   Social Determinants of Health   Financial Resource Strain: Low Risk    Difficulty of Paying Living Expenses: Not hard at all  Food Insecurity: No Food Insecurity   Worried About Charity fundraiser in the Last Year: Never true   Arboriculturist in the Last Year: Never true  Transportation Needs: No Transportation Needs   Lack of Transportation (Medical): No   Lack of Transportation (Non-Medical): No  Physical Activity: Insufficiently Active   Days of Exercise per Week: 2 days   Minutes of Exercise per Session: 60 min  Stress: Stress Concern Present   Feeling of Stress : To some extent  Social Connections: Moderately Isolated   Frequency of Communication with Friends and Family: More than three times a week   Frequency of Social Gatherings with Friends and Family: More than three times a week   Attends Religious Services: Never   Marine scientist or Organizations: No   Attends Music therapist: Never   Marital Status: Married  Human resources officer Violence: Not At Risk   Fear of Current or Ex-Partner: No   Emotionally Abused: No   Physically Abused: No   Sexually Abused: No     Family History  Problem Relation Age of Onset   COPD Mother    Cancer Mother      Current Facility-Administered Medications:    acetaminophen (TYLENOL) tablet 650 mg, 650 mg, Oral, Q6H PRN, 650 mg at 01/05/22 1655 **OR** acetaminophen (TYLENOL) suppository 650 mg, 650 mg,  Rectal, Q6H PRN, Agbata, Tochukwu, MD   ALPRAZolam (XANAX) tablet 0.25 mg, 0.25 mg, Oral, QHS PRN, Agbata, Tochukwu, MD   amLODipine (NORVASC) tablet 5 mg, 5 mg, Oral, Daily, Agbata, Tochukwu, MD, 5 mg at 01/05/22 1257   atorvastatin (LIPITOR) tablet 80 mg, 80 mg, Oral, Daily, Agbata, Tochukwu, MD, 80 mg at 01/05/22 1257   [START ON 01/06/2022] ferrous sulfate tablet 325 mg, 325 mg, Oral, Q breakfast, Agbata, Tochukwu, MD   folic acid (FOLVITE) tablet 1 mg, 1 mg, Oral, Daily, Agbata, Tochukwu, MD, 1 mg at 01/05/22 1257   lactated ringers infusion, , Intravenous, Continuous, Agbata, Tochukwu, MD, Last Rate: 125 mL/hr at 01/05/22 1024, New Bag at 01/05/22 1024   magnesium gluconate (MAGONATE) tablet 250 mg, 250 mg, Oral, QHS, Agbata, Tochukwu, MD   metoprolol tartrate (LOPRESSOR) injection 5 mg, 5 mg, Intravenous, Once, Agbata, Tochukwu, MD   ondansetron (ZOFRAN) tablet 4 mg, 4 mg, Oral, Q6H PRN **OR** ondansetron (ZOFRAN)  injection 4 mg, 4 mg, Intravenous, Q6H PRN, Agbata, Tochukwu, MD   pantoprazole (PROTONIX) injection 40 mg, 40 mg, Intravenous, Q24H, Agbata, Tochukwu, MD, 40 mg at 01/05/22 1258   ticagrelor (BRILINTA) tablet 90 mg, 90 mg, Oral, BID, Greta Doom, MD   Physical exam:  Vitals:   01/05/22 0902 01/05/22 1104 01/05/22 1200 01/05/22 1618  BP:  129/67 (!) 115/57 (!) 157/77  Pulse: 89 78 73 83  Resp: 18 (!) 25 (!) 21 19  Temp:  99.1 F (37.3 C)  100.2 F (37.9 C)  TempSrc:  Oral  Oral  SpO2: 97% 97% 96% 98%  Weight:      Height:       Physical Exam Cardiovascular:     Rate and Rhythm: Normal rate and regular rhythm.     Heart sounds: Normal heart sounds.  Pulmonary:     Effort: Pulmonary effort is normal.     Breath sounds: Normal breath sounds.  Abdominal:     General: Bowel sounds are normal.     Palpations: Abdomen is soft.  Skin:    General: Skin is warm and dry.  Neurological:     Mental Status: She is alert and oriented to person, place, and time.        CMP Latest Ref Rng & Units 01/05/2022  Glucose 70 - 99 mg/dL -  BUN 8 - 23 mg/dL -  Creatinine 0.44 - 1.00 mg/dL -  Sodium 135 - 145 mmol/L -  Potassium 3.5 - 5.1 mmol/L -  Chloride 98 - 111 mmol/L -  CO2 22 - 32 mmol/L -  Calcium 8.9 - 10.3 mg/dL -  Total Protein 6.5 - 8.1 g/dL 6.4(L)  Total Bilirubin 0.3 - 1.2 mg/dL 1.0  Alkaline Phos 38 - 126 U/L 49  AST 15 - 41 U/L 26  ALT 0 - 44 U/L 32   CBC Latest Ref Rng & Units 01/05/2022  WBC 4.0 - 10.5 K/uL 9.3  Hemoglobin 12.0 - 15.0 g/dL 8.1(L)  Hematocrit 36.0 - 46.0 % 24.2(L)  Platelets 150 - 400 K/uL 291    @IMAGES @  DG Chest Port 1 View  Result Date: 01/05/2022 CLINICAL DATA:  Weakness and lightheadedness. EXAM: PORTABLE CHEST 1 VIEW COMPARISON:  CT of the chest on 11/13/2021 FINDINGS: The heart size and mediastinal contours are within normal limits. Mild pulmonary interstitial prominence without overt edema, pleural fluid or pneumothorax. Mild underlying pulmonary interstitial edema cannot be excluded. There is atelectasis at both lung bases, right greater than left. The visualized skeletal structures are unremarkable. IMPRESSION: Mild pulmonary interstitial prominence may represent mild underlying pulmonary interstitial edema. Bibasilar atelectasis, right greater than left. Electronically Signed   By: Aletta Edouard M.D.   On: 01/05/2022 09:07   LONG TERM MONITOR (3-14 DAYS)  Result Date: 12/28/2021 Patch Wear Time:  13 days and 13 hours (2023-01-20T15:50:58-0500 to 2023-02-03T05:28:36-0500) Patient had a min HR of 42 bpm, max HR of 164 bpm, and avg HR of 61 bpm. Predominant underlying rhythm was Sinus Rhythm. 1 run of Ventricular Tachycardia occurred lasting 12 beats with a max rate of 164 bpm (avg 120 bpm). 26 Supraventricular Tachycardia runs occurred, the run with the fastest interval lasting 6 beats with a max rate of 154 bpm, the longest lasting 28.3 secs with an avg rate of 135 bpm. Some episodes of Supraventricular  Tachycardia may be possible Atrial Tachycardia with variable block. Isolated SVEs were rare (<1.0%), SVE Couplets were rare (<1.0%), and SVE Triplets were rare (<1.0%).  Isolated VEs were rare (<1.0%), VE Couplets were rare (<1.0%), and no VE Triplets were present. Ventricular Bigeminy and Trigeminy were present. Paroxysmal SVT and VT noted.  No evidence for atrial fibrillation.   Assessment and plan- Patient is a 71 y.o. female with history of recent DVT on Eliquis, recent stroke on Brilinta as well as autoimmune hemolytic anemia presenting with nosebleeds and worsening anemia  Macrocytic anemia: Her labs presently are not suggestive of worsening hemolysis as evidenced by normal bilirubin, normal reticulocyte count.  Macrocytosis also continues to reduce.  Haptoglobin is still currently pending.  It is unclear if the drop in her hemoglobin cannot be entirely attributed to nosebleeds but if her hemoglobin continues to drop would recommend getting GI evaluation given that she has been on 3 different blood thinners.  I will also add additional anemia work-up including ferritin and iron studies B12 folate and TSH to tomorrow's labs.  Okay to discontinue steroids at this point given her risk of bleeding History of DVT: This was recent inDecember 2022 and therefore she really needs to be on anticoagulation for at least 3 months.  Okay to hold Eliquis for the next couple of days but if hemoglobin is overall stable we need to restart and rechallenge her with Eliquis.  If there is a drop in her hemoglobin upon starting anticoagulation I will hold her Eliquis and refer her to vascular surgery as an outpatient for consideration of IVC filter  With regards to Brilinta I will defer to neurology if she would need to continue that along with Eliquis.    Visit Diagnosis 1. AKI (acute kidney injury) (HCC)   2. Weak   3. Anemia, unspecified type   4. Anticoagulated   5. Fever     Dr. Randa Evens, MD, MPH [facility] at  [facility] [phone] 01/05/2022

## 2022-01-06 ENCOUNTER — Encounter
Admission: EM | Disposition: A | Discharge status: Home / Self Care | Payer: Self-pay | Source: Home / Self Care | Attending: Internal Medicine

## 2022-01-06 ENCOUNTER — Inpatient Hospital Stay: Payer: Medicare Other

## 2022-01-06 ENCOUNTER — Inpatient Hospital Stay: Payer: Medicare Other | Admitting: Oncology

## 2022-01-06 ENCOUNTER — Other Ambulatory Visit (INDEPENDENT_AMBULATORY_CARE_PROVIDER_SITE_OTHER): Payer: Self-pay | Admitting: Nurse Practitioner

## 2022-01-06 ENCOUNTER — Inpatient Hospital Stay: Payer: Medicare Other | Admitting: Adult Health

## 2022-01-06 DIAGNOSIS — D5911 Warm autoimmune hemolytic anemia: Secondary | ICD-10-CM

## 2022-01-06 DIAGNOSIS — I82403 Acute embolism and thrombosis of unspecified deep veins of lower extremity, bilateral: Secondary | ICD-10-CM

## 2022-01-06 DIAGNOSIS — D649 Anemia, unspecified: Secondary | ICD-10-CM | POA: Diagnosis not present

## 2022-01-06 HISTORY — PX: IVC FILTER INSERTION: CATH118245

## 2022-01-06 LAB — TSH: TSH: 1.613 u[IU]/mL (ref 0.350–4.500)

## 2022-01-06 LAB — BASIC METABOLIC PANEL
Anion gap: 11 (ref 5–15)
BUN: 23 mg/dL (ref 8–23)
CO2: 23 mmol/L (ref 22–32)
Calcium: 8.9 mg/dL (ref 8.9–10.3)
Chloride: 106 mmol/L (ref 98–111)
Creatinine, Ser: 1.2 mg/dL — ABNORMAL HIGH (ref 0.44–1.00)
GFR, Estimated: 49 mL/min — ABNORMAL LOW (ref >60)
Glucose, Bld: 116 mg/dL — ABNORMAL HIGH (ref 70–99)
Potassium: 3.5 mmol/L (ref 3.5–5.1)
Sodium: 140 mmol/L (ref 135–145)

## 2022-01-06 LAB — CBC
HCT: 24.6 % — ABNORMAL LOW (ref 36.0–46.0)
Hemoglobin: 8 g/dL — ABNORMAL LOW (ref 12.0–15.0)
MCH: 35.2 pg — ABNORMAL HIGH (ref 26.0–34.0)
MCHC: 32.5 g/dL (ref 30.0–36.0)
MCV: 108.4 fL — ABNORMAL HIGH (ref 80.0–100.0)
Platelets: 312 10*3/uL (ref 150–400)
RBC: 2.27 MIL/uL — ABNORMAL LOW (ref 3.87–5.11)
RDW: 14.2 % (ref 11.5–15.5)
WBC: 8.2 10*3/uL (ref 4.0–10.5)
nRBC: 0 % (ref 0.0–0.2)

## 2022-01-06 LAB — HAPTOGLOBIN: Haptoglobin: 330 mg/dL (ref 37–355)

## 2022-01-06 LAB — FERRITIN: Ferritin: 838 ng/mL — ABNORMAL HIGH (ref 11–307)

## 2022-01-06 LAB — IRON AND TIBC
Iron: 20 ug/dL — ABNORMAL LOW (ref 28–170)
Saturation Ratios: 10 % — ABNORMAL LOW (ref 10.4–31.8)
TIBC: 204 ug/dL — ABNORMAL LOW (ref 250–450)
UIBC: 184 ug/dL

## 2022-01-06 LAB — VITAMIN B12: Vitamin B-12: 339 pg/mL (ref 180–914)

## 2022-01-06 LAB — FOLATE: Folate: 30 ng/mL (ref >5.9)

## 2022-01-06 SURGERY — IVC FILTER INSERTION
Anesthesia: Moderate Sedation

## 2022-01-06 MED ORDER — CEFAZOLIN SODIUM-DEXTROSE 2-4 GM/100ML-% IV SOLN: 2.0000 g | INTRAVENOUS | Status: DC

## 2022-01-06 MED ORDER — ONDANSETRON HCL 4 MG/2ML IJ SOLN: 4.0000 mg | Freq: Four times a day (QID) | INTRAMUSCULAR | Status: DC | PRN

## 2022-01-06 MED ORDER — IODIXANOL 320 MG/ML IV SOLN
INTRAVENOUS | Status: DC | PRN
  Administered 2022-01-06: 15 mL

## 2022-01-06 MED ORDER — HYDROMORPHONE HCL 1 MG/ML IJ SOLN: 1.0000 mg | Freq: Once | INTRAMUSCULAR | Status: DC | PRN

## 2022-01-06 MED ORDER — FAMOTIDINE 20 MG PO TABS: 40.0000 mg | ORAL_TABLET | Freq: Once | ORAL | Status: DC | PRN

## 2022-01-06 MED ORDER — CYANOCOBALAMIN 1000 MCG/ML IJ SOLN
1000.0000 ug | Freq: Once | INTRAMUSCULAR | Status: DC
  Filled 2022-01-06: qty 1

## 2022-01-06 MED ORDER — VITAMIN B-12 100 MCG PO TABS
100.0000 ug | ORAL_TABLET | Freq: Every day | ORAL | Status: DC
  Administered 2022-01-07 – 2022-01-15 (×9): 100 ug via ORAL
  Filled 2022-01-06 (×9): qty 1

## 2022-01-06 MED ORDER — METHYLPREDNISOLONE SODIUM SUCC 125 MG IJ SOLR: 125.0000 mg | Freq: Once | INTRAMUSCULAR | Status: DC | PRN

## 2022-01-06 MED ORDER — MIDAZOLAM HCL 2 MG/2ML IJ SOLN
INTRAMUSCULAR | Status: DC | PRN
  Administered 2022-01-06: 2 mg via INTRAVENOUS

## 2022-01-06 MED ORDER — MIDAZOLAM HCL 2 MG/2ML IJ SOLN
INTRAMUSCULAR | Status: AC
  Filled 2022-01-06: qty 2

## 2022-01-06 MED ORDER — SODIUM CHLORIDE 0.9 % IV SOLN: INTRAVENOUS | Status: DC

## 2022-01-06 MED ORDER — MIDAZOLAM HCL 2 MG/ML PO SYRP: 8.0000 mg | ORAL_SOLUTION | Freq: Once | ORAL | Status: DC | PRN

## 2022-01-06 MED ORDER — DIPHENHYDRAMINE HCL 50 MG/ML IJ SOLN: 50.0000 mg | Freq: Once | INTRAMUSCULAR | Status: DC | PRN

## 2022-01-06 SURGICAL SUPPLY — 5 items
COVER PROBE U/S 5X48 (MISCELLANEOUS) ×1 IMPLANT
GUIDEWIRE VERSACORE 260 (WIRE) ×1 IMPLANT
KIT FEM OPTION ELITE FILTER (Filter) ×1 IMPLANT
PACK ANGIOGRAPHY (CUSTOM PROCEDURE TRAY) ×2 IMPLANT
WIRE GUIDERIGHT .035X150 (WIRE) ×1 IMPLANT

## 2022-01-06 NOTE — Op Note (Signed)
[facility]   OPERATIVE NOTE    PRE-OPERATIVE DIAGNOSIS: DVT, anemia  POST-OPERATIVE DIAGNOSIS: same as above  PROCEDURE: 1.   Ultrasound guidance for vascular access to the right vein 2.   Catheter placement into the inferior vena cava 3.   Inferior venacavogram 4.   Placement of a Option Elite IVC filter  SURGEON: Leotis Pain, MD  ASSISTANT(S): None  ANESTHESIA: local with Moderate Conscious Sedation for approximately 13 minutes using 2 mg of Versed   ESTIMATED BLOOD LOSS: minimal  CONTRAST: 15 cc  FLUORO TIME: less than one minute  FINDING(S): 1.  Patent IVC  SPECIMEN(S):  none  INDICATIONS:   Andrea Williamson is a 71 y.o. female who presents with BLE DVT and anemia.  Inferior vena cava filter is indicated for this reason.  Risks and benefits including filter thrombosis, migration, fracture, bleeding, and infection were all discussed.  We discussed that all IVC filters that we place can be removed if desired from the patient once the need for the filter has passed.    DESCRIPTION: After obtaining full informed written consent, the patient was brought back to the vascular suite. The skin was sterilely prepped and draped in a sterile surgical field was created. Moderate conscious sedation was administered during a face to face encounter with the patient throughout the procedure with my supervision of the RN administering medicines and monitoring the patient's vital signs, pulse oximetry, telemetry and mental status throughout from the start of the procedure until the patient was taken to the recovery room. The right femoral vein was accessed under direct ultrasound guidance without difficulty with a Seldinger needle and a J-wire was then placed. After skin nick and dilatation, the delivery sheath was placed into the inferior vena cava and an inferior venacavogram was performed. This demonstrated a patent IVC with the level of the renal veins at L1.  The filter  was then deployed into the inferior vena cava at the level of the L1-L2 interspace just below the renal veins. The delivery sheath was then removed. Pressure was held. Sterile dressings were placed. The patient tolerated the procedure well and was taken to the recovery room in stable condition.  COMPLICATIONS: None  CONDITION: Stable  Leotis Pain  01/06/2022, 4:17 PM   This note was created with Dragon Medical transcription system. Any errors in dictation are purely unintentional.

## 2022-01-06 NOTE — Progress Notes (Signed)
OT Cancellation Note  Patient Details Name: Andrea Williamson MRN: [id number] DOB: September 19, 1951   Cancelled Treatment:    Reason Eval/Treat Not Completed: Patient at procedure or test/ unavailable Pt OTF for IVC filter placement at this time. Will f/u at later date/time as able for OT evaluation. Thank you.  Gerrianne Scale, [place], OTR/L ascom [phone] 01/06/22, 3:15 PM

## 2022-01-06 NOTE — Progress Notes (Signed)
Hematology/Oncology Consult note [facility]  [phone] [phone]  Patient Care Team: [name], MD as PCP - General (Oncology) [name], MD as Consulting Physician (Hematology and Oncology)   Name of the patient: Andrea Williamson  [id number]  December 12, 1950   Date of visit:01/06/2022  Interval history-denies any further nosebleeds.  Denies any blood loss in her stool or urine.  Reports ongoing fatigue. ECOG PS- 1 Pain scale- 0   Review of systems- Review of Systems  Constitutional:  Positive for malaise/fatigue. Negative for chills, fever and weight loss.  HENT:  Negative for congestion, ear discharge and nosebleeds.   Eyes:  Negative for blurred vision.  Respiratory:  Negative for cough, hemoptysis, sputum production, shortness of breath and wheezing.   Cardiovascular:  Negative for chest pain, palpitations, orthopnea and claudication.  Gastrointestinal:  Negative for abdominal pain, blood in stool, constipation, diarrhea, heartburn, melena, nausea and vomiting.  Genitourinary:  Negative for dysuria, flank pain, frequency, hematuria and urgency.  Musculoskeletal:  Negative for back pain, joint pain and myalgias.  Skin:  Negative for rash.  Neurological:  Negative for dizziness, tingling, focal weakness, seizures, weakness and headaches.  Endo/Heme/Allergies:  Does not bruise/bleed easily.  Psychiatric/Behavioral:  Negative for depression and suicidal ideas. The patient does not have insomnia.      No Known Allergies   Past Medical History:  Diagnosis Date   Deep vein thrombosis (DVT) ([place])    Hypertension    Stroke [facility])      Past Surgical History:  Procedure Laterality Date   ANKLE SURGERY Left 2008   IR CT HEAD LTD  10/05/2021   IR INTRA CRAN STENT  10/01/2021   IR PERCUTANEOUS ART THROMBECTOMY/INFUSION INTRACRANIAL INC DIAG ANGIO  10/01/2021   IR US GUIDE VASC ACCESS RIGHT  10/01/2021   RADIOLOGY WITH ANESTHESIA  N/A 10/01/2021   Procedure: IR WITH ANESTHESIA;  Surgeon: Radiologist, Medication, MD;  Location: [facility];  Service: Radiology;  Laterality: N/A;    Social History   Socioeconomic History   Marital status: Married    Spouse name: Not on file   Number of children: 3   Years of education: 12   Highest education level: 12th grade  Occupational History   Occupation: Retired  Tobacco Use   Smoking status: Former    Packs/day: 1.50    Years: 50.00    Pack years: 75.00    Types: Cigarettes    Quit date: 09/2021    Years since quitting: 0.3   Smokeless tobacco: Never  Vaping Use   Vaping Use: Never used  Substance and Sexual Activity   Alcohol use: Not Currently   Drug use: Not Currently   Sexual activity: Not Currently  Other Topics Concern   Not on file  Social History Narrative   October 2022 stopped   Social Determinants of Health   Financial Resource Strain: Low Risk    Difficulty of Paying Living Expenses: Not hard at all  Food Insecurity: No Food Insecurity   Worried About Charity fundraiser in the Last Year: Never true   Arboriculturist in the Last Year: Never true  Transportation Needs: No Transportation Needs   Lack of Transportation (Medical): No   Lack of Transportation (Non-Medical): No  Physical Activity: Insufficiently Active   Days of Exercise per Week: 2 days   Minutes of Exercise per Session: 60 min  Stress: Stress Concern Present   Feeling of Stress :  To some extent  Social Connections: Moderately Isolated   Frequency of Communication with Friends and Family: More than three times a week   Frequency of Social Gatherings with Friends and Family: More than three times a week   Attends Religious Services: Never   Marine scientist or Organizations: No   Attends Music therapist: Never   Marital Status: Married  Human resources officer Violence: Not At Risk   Fear of Current or Ex-Partner: No   Emotionally Abused: No   Physically Abused: No    Sexually Abused: No    Family History  Problem Relation Age of Onset   COPD Mother    Cancer Mother      Current Facility-Administered Medications:    midazolam (VERSED) 2 MG/2ML injection, , , ,    0.9 %  sodium chloride infusion, , Intravenous, Continuous, Eulogio Ditch E, NP, Last Rate: 75 mL/hr at 01/06/22 1500, New Bag at 01/06/22 1500   [MAR Hold] acetaminophen (TYLENOL) tablet 650 mg, 650 mg, Oral, Q6H PRN, 650 mg at 01/06/22 0049 **OR** [MAR Hold] acetaminophen (TYLENOL) suppository 650 mg, 650 mg, Rectal, Q6H PRN, Agbata, Tochukwu, MD   [MAR Hold] ALPRAZolam (XANAX) tablet 0.25 mg, 0.25 mg, Oral, QHS PRN, Agbata, Tochukwu, MD   [MAR Hold] amLODipine (NORVASC) tablet 5 mg, 5 mg, Oral, Daily, Agbata, Tochukwu, MD, 5 mg at 01/06/22 0832   [MAR Hold] atorvastatin (LIPITOR) tablet 80 mg, 80 mg, Oral, Daily, Agbata, Tochukwu, MD, 80 mg at 01/06/22 0829   [START ON 01/07/2022] ceFAZolin (ANCEF) IVPB 2g/100 mL premix, 2 g, Intravenous, On Call to [place], Kris Hartmann, NP   [MAR Hold] cyanocobalamin ((VITAMIN B-12)) injection 1,000 mcg, 1,000 mcg, Intramuscular, Once, Wyvonnia Dusky, MD   diphenhydrAMINE (BENADRYL) injection 50 mg, 50 mg, Intravenous, Once PRN, Kris Hartmann, NP   famotidine (PEPCID) tablet 40 mg, 40 mg, Oral, Once PRN, Kris Hartmann, NP   [MAR Hold] ferrous sulfate tablet 325 mg, 325 mg, Oral, Q breakfast, Agbata, Tochukwu, MD, 325 mg at 01/06/22 1610   [MAR Hold] folic acid (FOLVITE) tablet 1 mg, 1 mg, Oral, Daily, Agbata, Tochukwu, MD, 1 mg at 01/06/22 0830   [MAR Hold] HYDROmorphone (DILAUDID) injection 1 mg, 1 mg, Intravenous, Once PRN, Kris Hartmann, NP   [MAR Hold] magnesium gluconate (MAGONATE) tablet 250 mg, 250 mg, Oral, QHS, Agbata, Tochukwu, MD, 250 mg at 01/05/22 2039   methylPREDNISolone sodium succinate (SOLU-MEDROL) 125 mg/2 mL injection 125 mg, 125 mg, Intravenous, Once PRN, Kris Hartmann, NP   [MAR Hold] metoprolol tartrate (LOPRESSOR)  injection 5 mg, 5 mg, Intravenous, Once, Agbata, Tochukwu, MD   midazolam (VERSED) 2 MG/ML syrup 8 mg, 8 mg, Oral, Once PRN, Kris Hartmann, NP   [MAR Hold] ondansetron (ZOFRAN) tablet 4 mg, 4 mg, Oral, Q6H PRN **OR** [MAR Hold] ondansetron (ZOFRAN) injection 4 mg, 4 mg, Intravenous, Q6H PRN, Agbata, Tochukwu, MD   [MAR Hold] pantoprazole (PROTONIX) injection 40 mg, 40 mg, Intravenous, Q24H, Agbata, Tochukwu, MD, 40 mg at 01/06/22 0832   [MAR Hold] ticagrelor (BRILINTA) tablet 90 mg, 90 mg, Oral, BID, Greta Doom, MD, 90 mg at 01/06/22 0836   [MAR Hold] vitamin B-12 (CYANOCOBALAMIN) tablet 100 mcg, 100 mcg, Oral, Daily, Wyvonnia Dusky, MD  Physical exam:  Vitals:   01/06/22 0511 01/06/22 0758 01/06/22 1134 01/06/22 1453  BP: 123/61 116/74 134/73 130/68  Pulse: 69 76 96 93  Resp:  17 18 18   Temp: 98.6 F (  37 C) 99.7 F (37.6 C) 98.7 F (37.1 C) 99.6 F (37.6 C)  TempSrc: Oral   Oral  SpO2: 91% 95% 91% 90%  Weight:      Height:       Physical Exam Constitutional:      General: She is not in acute distress. Cardiovascular:     Rate and Rhythm: Normal rate and regular rhythm.     Heart sounds: Normal heart sounds.  Pulmonary:     Effort: Pulmonary effort is normal.     Breath sounds: Normal breath sounds.  Abdominal:     General: Bowel sounds are normal.     Palpations: Abdomen is soft.  Skin:    General: Skin is warm and dry.  Neurological:     Mental Status: She is alert and oriented to person, place, and time.     CMP Latest Ref Rng & Units 01/06/2022  Glucose 70 - 99 mg/dL 116(H)  BUN 8 - 23 mg/dL 23  Creatinine 0.44 - 1.00 mg/dL 1.20(H)  Sodium 135 - 145 mmol/L 140  Potassium 3.5 - 5.1 mmol/L 3.5  Chloride 98 - 111 mmol/L 106  CO2 22 - 32 mmol/L 23  Calcium 8.9 - 10.3 mg/dL 8.9  Total Protein 6.5 - 8.1 g/dL -  Total Bilirubin 0.3 - 1.2 mg/dL -  Alkaline Phos 38 - 126 U/L -  AST 15 - 41 U/L -  ALT 0 - 44 U/L -   CBC Latest Ref Rng & Units  01/06/2022  WBC 4.0 - 10.5 K/uL 8.2  Hemoglobin 12.0 - 15.0 g/dL 8.0(L)  Hematocrit 36.0 - 46.0 % 24.6(L)  Platelets 150 - 400 K/uL 312    @IMAGES @  DG Chest Port 1 View  Result Date: 01/05/2022 CLINICAL DATA:  Weakness and lightheadedness. EXAM: PORTABLE CHEST 1 VIEW COMPARISON:  CT of the chest on 11/13/2021 FINDINGS: The heart size and mediastinal contours are within normal limits. Mild pulmonary interstitial prominence without overt edema, pleural fluid or pneumothorax. Mild underlying pulmonary interstitial edema cannot be excluded. There is atelectasis at both lung bases, right greater than left. The visualized skeletal structures are unremarkable. IMPRESSION: Mild pulmonary interstitial prominence may represent mild underlying pulmonary interstitial edema. Bibasilar atelectasis, right greater than left. Electronically Signed   By: Aletta Edouard M.D.   On: 01/05/2022 09:07   LONG TERM MONITOR (3-14 DAYS)  Result Date: 12/28/2021 Patch Wear Time:  13 days and 13 hours (2023-01-20T15:50:58-0500 to 2023-02-03T05:28:36-0500) Patient had a min HR of 42 bpm, max HR of 164 bpm, and avg HR of 61 bpm. Predominant underlying rhythm was Sinus Rhythm. 1 run of Ventricular Tachycardia occurred lasting 12 beats with a max rate of 164 bpm (avg 120 bpm). 26 Supraventricular Tachycardia runs occurred, the run with the fastest interval lasting 6 beats with a max rate of 154 bpm, the longest lasting 28.3 secs with an avg rate of 135 bpm. Some episodes of Supraventricular Tachycardia may be possible Atrial Tachycardia with variable block. Isolated SVEs were rare (<1.0%), SVE Couplets were rare (<1.0%), and SVE Triplets were rare (<1.0%). Isolated VEs were rare (<1.0%), VE Couplets were rare (<1.0%), and no VE Triplets were present. Ventricular Bigeminy and Trigeminy were present. Paroxysmal SVT and VT noted.  No evidence for atrial fibrillation.    Assessment and plan- Patient is a 71 y.o. 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  Hemolysis has resolved with steroids and Rituxan.  Her hemoglobin has now been stable over the last 2 days around 8 although Her hemoglobin 2 weeks ago had gone up to 11.  B12 folate normal.  Iron studies not indicated of iron deficiency.  Brilinta has been restarted by neurology given concern for stent thrombosis.  Eliquis is still on hold.  Given her acute DVT in December 2022, I would recommend proceeding with an IVC filter at this time until we can decide if we can rechallenge her with Eliquis.  If her hemoglobin improves over the next 2 weeks I do plan to reinitiate that.  Although IVC filter can potentially prevent her from getting a PE from her pre-existing DVT does not prevent propagation of her existing DVT upon stopping Eliquis.  I will follow her up as an outpatient upon discharge   Visit Diagnosis 1. AKI (acute kidney injury) (HCC)   2. Weak   3. Anemia, unspecified type   4. Anticoagulated   5. Fever      Dr. Randa Evens, MD, MPH [facility] [phone] 01/06/2022 4:02 PM

## 2022-01-06 NOTE — Progress Notes (Signed)
°   01/06/22 0511  Assess: MEWS Score  Temp 98.6 F (37 C)  BP 123/61  Pulse Rate 69  SpO2 91 %  O2 Device Room Air  Assess: MEWS Score  MEWS Temp 0  MEWS Systolic 0  MEWS Pulse 0  MEWS RR 0  MEWS LOC 0  MEWS Score 0  MEWS Score Color Green  Assess: if the MEWS score is Yellow or Red  Were vital signs taken at a resting state? Yes  Focused Assessment No change from prior assessment  Does the patient meet 2 or more of the SIRS criteria? No  MEWS guidelines implemented *See Row Information* No, previously yellow, continue vital signs every 4 hours  Take Vital Signs  Increase Vital Sign Frequency  Yellow: Q 2hr X 2 then Q 4hr X 2, if remains yellow, continue Q 4hrs  Assess: SIRS CRITERIA  SIRS Temperature  0  SIRS Pulse 0  SIRS Respirations  0  SIRS WBC 0  SIRS Score Sum  0

## 2022-01-06 NOTE — Progress Notes (Signed)
Patient's temp at 2053 is 101.5. Pt denies chills and pain. Gave PRN acetaminophen 650mg  for elevated temperature. Notified Charge Nurse and on call NP. MEWS level 2 yellow. VS checks Q2 hours initiated per protocol. Pt is currently resting in bed watching television.

## 2022-01-06 NOTE — Progress Notes (Signed)
PROGRESS NOTE    Andrea Williamson  SWH:[id number] DOB: 12/17/1950 DOA: 01/05/2022 PCP: [name], MD   Assessment & Plan:   Principal Problem:   Symptomatic anemia Active Problems:   History of stroke   Essential hypertension   Warm autoimmune hemolytic anemia (HCC)   DVT (deep venous thrombosis) (HCC)   Fever   Symptomatic anemia: etiology unclear. W/ drop in H&H over last 3 weeks from 11.7 to 8.1. Continue on PPI. Not likely GI bleed as per GI. Will give B12 as B12 level on low end of normal   Fever: etiology unclear. Blood cxs NGTD. CXR shows pulmonary interstitial edema no pneumonia. Influenza, COVID19 are both neg. Hold off on abxs for now   DVT: continue to hold eilquis. Will have IVC filtered placed via vascular surg as per recs of onco   Hx warm autoimmune hemolytic anemia: was on rituxan w/ last dose given 12/09/21. No hemolysis currently as per onco. Onco recs apprec    HTN: BP is WNL. 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 Continue on brilinta. Hold eliquis     DVT prophylaxis:  SCDs Code Status: full  Family Communication: discussed pt's care w/ pt's family at bedside and answered their questions  Disposition Plan: depends on PT/OT recs   Level of care: Med-Surg  Status is: Inpatient Remains inpatient appropriate because: still needs to get IVC filtered placed and monitor H&H     Consultants:  Neuro Onco  GI   Procedures:   Antimicrobials:   Subjective: Pt c/o fatigue   Objective: Vitals:   01/06/22 0044 01/06/22 0244 01/06/22 0511 01/06/22 0758  BP: (!) 159/86 (!) 115/57 123/61 116/74  Pulse: 83 73 69 76  Resp: 16 17  17   Temp: (!) 103.1 F (39.5 C) 99.1 F (37.3 C) 98.6 F (37 C) 99.7 F (37.6 C)  TempSrc:   Oral   SpO2: 98% 92% 91% 95%  Weight:      Height:        Intake/Output Summary (Last 24 hours) at 01/06/2022 0849 Last data  filed at 01/05/2022 1900 Gross per 24 hour  Intake 240 ml  Output --  Net 240 ml   Filed Weights   01/04/22 1830  Weight: 73.3 kg    Examination:  General exam: Appears calm and comfortable  Respiratory system: Clear to auscultation. Respiratory effort normal. Cardiovascular system: S1 & S2 +. No rubs, gallops or clicks.  Gastrointestinal system: Abdomen is nondistended, soft and nontender. Normal bowel sounds heard. Central nervous system: Alert and awake. Moves all extremities  Psychiatry: Judgement and insight appear poor. Agitated mood and affect    Data Reviewed: I have personally reviewed following labs and imaging studies  CBC: Recent Labs  Lab 01/04/22 1833 01/05/22 0926 01/05/22 1806 01/06/22 0558  WBC 9.6 9.3  --  8.2  HGB 9.2* 8.1* 7.8* 8.0*  HCT 27.5* 24.2* 23.0* 24.6*  MCV 105.8* 106.1*  --  108.4*  PLT 310 291  --  665   Basic Metabolic Panel: Recent Labs  Lab 01/04/22 1833 01/06/22 0558  NA 131* 140  K 3.8 3.5  CL 100 106  CO2 21* 23  GLUCOSE 109* 116*  BUN 33* 23  CREATININE 1.65* 1.20*  CALCIUM 8.9 8.9   GFR: Estimated Creatinine Clearance: 40.9 mL/min (A) (by C-G formula based on SCr of 1.2 mg/dL (H)). Liver Function  Tests: Recent Labs  Lab 01/05/22 0253  AST 26  ALT 32  ALKPHOS 49  BILITOT 1.0  PROT 6.4*  ALBUMIN 3.3*   No results for input(s): LIPASE, AMYLASE in the last 168 hours. No results for input(s): AMMONIA in the last 168 hours. Coagulation Profile: Recent Labs  Lab 01/04/22 1833  INR 1.4*   Cardiac Enzymes: No results for input(s): CKTOTAL, CKMB, CKMBINDEX, TROPONINI in the last 168 hours. BNP (last 3 results) No results for input(s): PROBNP in the last 8760 hours. HbA1C: No results for input(s): HGBA1C in the last 72 hours. CBG: Recent Labs  Lab 01/05/22 0844  GLUCAP 126*   Lipid Profile: No results for input(s): CHOL, HDL, LDLCALC, TRIG, CHOLHDL, LDLDIRECT in the last 72 hours. Thyroid Function  Tests: Recent Labs    01/06/22 0558  TSH 1.613   Anemia Panel: Recent Labs    01/04/22 1832 01/06/22 0558  FOLATE  --  30.0  FERRITIN  --  838*  TIBC  --  204*  IRON  --  20*  RETICCTPCT 3.1  --    Sepsis Labs: Recent Labs  Lab 01/05/22 0926 01/05/22 1307  LATICACIDVEN 2.0* 1.5    Recent Results (from the past 240 hour(s))  Resp Panel by RT-PCR (Flu A&B, Covid) Nasopharyngeal Swab     Status: None   Collection Time: 01/05/22  3:53 AM   Specimen: Nasopharyngeal Swab; Nasopharyngeal(NP) swabs in vial transport medium  Result Value Ref Range Status   SARS Coronavirus 2 by RT PCR NEGATIVE NEGATIVE Final    Comment: (NOTE) SARS-CoV-2 target nucleic acids are NOT DETECTED.  The SARS-CoV-2 RNA is generally detectable in upper respiratory specimens during the acute phase of infection. The lowest concentration of SARS-CoV-2 viral copies this assay can detect is 138 copies/mL. A negative result does not preclude SARS-Cov-2 infection and should not be used as the sole basis for treatment or other patient management decisions. A negative result may occur with  improper specimen collection/handling, submission of specimen other than nasopharyngeal swab, presence of viral mutation(s) within the areas targeted by this assay, and inadequate number of viral copies(<138 copies/mL). A negative result must be combined with clinical observations, patient history, and epidemiological information. The expected result is Negative.  Fact Sheet for Patients:  EntrepreneurPulse.com.au  Fact Sheet for Healthcare Providers:  IncredibleEmployment.be  This test is no t yet approved or cleared by the [place] FDA and  has been authorized for detection and/or diagnosis of SARS-CoV-2 by FDA under an Emergency Use Authorization (EUA). This EUA will remain  in effect (meaning this test can be used) for the duration of the COVID-19 declaration under Section  564(b)(1) of the Act, 21 U.S.C.section 360bbb-3(b)(1), unless the authorization is terminated  or revoked sooner.       Influenza A by PCR NEGATIVE NEGATIVE Final   Influenza B by PCR NEGATIVE NEGATIVE Final    Comment: (NOTE) The Xpert Xpress SARS-CoV-2/FLU/RSV plus assay is intended as an aid in the diagnosis of influenza from Nasopharyngeal swab specimens and should not be used as a sole basis for treatment. Nasal washings and aspirates are unacceptable for Xpert Xpress SARS-CoV-2/FLU/RSV testing.  Fact Sheet for Patients: EntrepreneurPulse.com.au  Fact Sheet for Healthcare Providers: IncredibleEmployment.be  This test is not yet approved or cleared by the [place] FDA and has been authorized for detection and/or diagnosis of SARS-CoV-2 by FDA under an Emergency Use Authorization (EUA). This EUA will remain in effect (meaning this test can be used)  for the duration of the COVID-19 declaration under Section 564(b)(1) of the Act, 21 U.S.C. section 360bbb-3(b)(1), unless the authorization is terminated or revoked.  Performed at [facility], [address] [place], [place] [address]   CULTURE, BLOOD (ROUTINE X 2) w Reflex to ID Panel     Status: None (Preliminary result)   Collection Time: 01/05/22  9:26 AM   Specimen: BLOOD  Result Value Ref Range Status   Specimen Description BLOOD RIGHT ANTECUBITAL  Final   Special Requests   Final    Blood Culture results may not be optimal due to an excessive volume of blood received in culture bottles   Culture   Final    NO GROWTH < 24 HOURS Performed at [facility], [address], [place], [place] [address]    Report Status PENDING  Incomplete  CULTURE, BLOOD (ROUTINE X 2) w Reflex to ID Panel     Status: None (Preliminary result)   Collection Time: 01/05/22  9:26 AM   Specimen: BLOOD  Result Value Ref Range Status   Specimen Description BLOOD BLOOD RIGHT HAND   Final   Special Requests   Final    BOTTLES DRAWN AEROBIC AND ANAEROBIC Blood Culture results may not be optimal due to an excessive volume of blood received in culture bottles   Culture   Final    NO GROWTH < 24 HOURS Performed at [facility], [address] [place], [place] [address]    Report Status PENDING  Incomplete         Radiology Studies: DG Chest Port 1 View  Result Date: 01/05/2022 CLINICAL DATA:  Weakness and lightheadedness. EXAM: PORTABLE CHEST 1 VIEW COMPARISON:  CT of the chest on 11/13/2021 FINDINGS: The heart size and mediastinal contours are within normal limits. Mild pulmonary interstitial prominence without overt edema, pleural fluid or pneumothorax. Mild underlying pulmonary interstitial edema cannot be excluded. There is atelectasis at both lung bases, right greater than left. The visualized skeletal structures are unremarkable. IMPRESSION: Mild pulmonary interstitial prominence may represent mild underlying pulmonary interstitial edema. Bibasilar atelectasis, right greater than left. Electronically Signed   By: Aletta Edouard M.D.   On: 01/05/2022 09:07        Scheduled Meds:  amLODipine  5 mg Oral Daily   atorvastatin  80 mg Oral Daily   ferrous sulfate  325 mg Oral Q breakfast   folic acid  1 mg Oral Daily   magnesium gluconate  250 mg Oral QHS   metoprolol tartrate  5 mg Intravenous Once   pantoprazole (PROTONIX) IV  40 mg Intravenous Q24H   ticagrelor  90 mg Oral BID   Continuous Infusions:   LOS: 1 day    Time spent: 32 mins     Wyvonnia Dusky, MD [facility] Pager [phone]  If 7PM-7AM, please contact night-coverage 01/06/2022, 8:49 AM

## 2022-01-06 NOTE — H&P (View-Only) (Signed)
PROGRESS NOTE    Andrea Williamson  WGN:[id number] DOB: 02/08/1951 DOA: 01/05/2022 PCP: [name], MD   Assessment & Plan:   Principal Problem:   Symptomatic anemia Active Problems:   History of stroke   Essential hypertension   Warm autoimmune hemolytic anemia (HCC)   DVT (deep venous thrombosis) (HCC)   Fever   Symptomatic anemia: etiology unclear. W/ drop in H&H over last 3 weeks from 11.7 to 8.1. Continue on PPI. Not likely GI bleed as per GI. Will give B12 as B12 level on low end of normal   Fever: etiology unclear. Blood cxs NGTD. CXR shows pulmonary interstitial edema no pneumonia. Influenza, COVID19 are both neg. Hold off on abxs for now   DVT: continue to hold eilquis. Will have IVC filtered placed via vascular surg as per recs of onco   Hx warm autoimmune hemolytic anemia: was on rituxan w/ last dose given 12/09/21. No hemolysis currently as per onco. Onco recs apprec    HTN: BP is WNL. 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 Continue on brilinta. Hold eliquis     DVT prophylaxis:  SCDs Code Status: full  Family Communication: discussed pt's care w/ pt's family at bedside and answered their questions  Disposition Plan: depends on PT/OT recs   Level of care: Med-Surg  Status is: Inpatient Remains inpatient appropriate because: still needs to get IVC filtered placed and monitor H&H     Consultants:  Neuro Onco  GI   Procedures:   Antimicrobials:   Subjective: Pt c/o fatigue   Objective: Vitals:   01/06/22 0044 01/06/22 0244 01/06/22 0511 01/06/22 0758  BP: (!) 159/86 (!) 115/57 123/61 116/74  Pulse: 83 73 69 76  Resp: 16 17  17   Temp: (!) 103.1 F (39.5 C) 99.1 F (37.3 C) 98.6 F (37 C) 99.7 F (37.6 C)  TempSrc:   Oral   SpO2: 98% 92% 91% 95%  Weight:      Height:        Intake/Output Summary (Last 24 hours) at 01/06/2022 0849 Last data  filed at 01/05/2022 1900 Gross per 24 hour  Intake 240 ml  Output --  Net 240 ml   Filed Weights   01/04/22 1830  Weight: 73.3 kg    Examination:  General exam: Appears calm and comfortable  Respiratory system: Clear to auscultation. Respiratory effort normal. Cardiovascular system: S1 & S2 +. No rubs, gallops or clicks.  Gastrointestinal system: Abdomen is nondistended, soft and nontender. Normal bowel sounds heard. Central nervous system: Alert and awake. Moves all extremities  Psychiatry: Judgement and insight appear poor. Agitated mood and affect    Data Reviewed: I have personally reviewed following labs and imaging studies  CBC: Recent Labs  Lab 01/04/22 1833 01/05/22 0926 01/05/22 1806 01/06/22 0558  WBC 9.6 9.3  --  8.2  HGB 9.2* 8.1* 7.8* 8.0*  HCT 27.5* 24.2* 23.0* 24.6*  MCV 105.8* 106.1*  --  108.4*  PLT 310 291  --  784   Basic Metabolic Panel: Recent Labs  Lab 01/04/22 1833 01/06/22 0558  NA 131* 140  K 3.8 3.5  CL 100 106  CO2 21* 23  GLUCOSE 109* 116*  BUN 33* 23  CREATININE 1.65* 1.20*  CALCIUM 8.9 8.9   GFR: Estimated Creatinine Clearance: 40.9 mL/min (A) (by C-G formula based on SCr of 1.2 mg/dL (H)). Liver Function  Tests: Recent Labs  Lab 01/05/22 0253  AST 26  ALT 32  ALKPHOS 49  BILITOT 1.0  PROT 6.4*  ALBUMIN 3.3*   No results for input(s): LIPASE, AMYLASE in the last 168 hours. No results for input(s): AMMONIA in the last 168 hours. Coagulation Profile: Recent Labs  Lab 01/04/22 1833  INR 1.4*   Cardiac Enzymes: No results for input(s): CKTOTAL, CKMB, CKMBINDEX, TROPONINI in the last 168 hours. BNP (last 3 results) No results for input(s): PROBNP in the last 8760 hours. HbA1C: No results for input(s): HGBA1C in the last 72 hours. CBG: Recent Labs  Lab 01/05/22 0844  GLUCAP 126*   Lipid Profile: No results for input(s): CHOL, HDL, LDLCALC, TRIG, CHOLHDL, LDLDIRECT in the last 72 hours. Thyroid Function  Tests: Recent Labs    01/06/22 0558  TSH 1.613   Anemia Panel: Recent Labs    01/04/22 1832 01/06/22 0558  FOLATE  --  30.0  FERRITIN  --  838*  TIBC  --  204*  IRON  --  20*  RETICCTPCT 3.1  --    Sepsis Labs: Recent Labs  Lab 01/05/22 0926 01/05/22 1307  LATICACIDVEN 2.0* 1.5    Recent Results (from the past 240 hour(s))  Resp Panel by RT-PCR (Flu A&B, Covid) Nasopharyngeal Swab     Status: None   Collection Time: 01/05/22  3:53 AM   Specimen: Nasopharyngeal Swab; Nasopharyngeal(NP) swabs in vial transport medium  Result Value Ref Range Status   SARS Coronavirus 2 by RT PCR NEGATIVE NEGATIVE Final    Comment: (NOTE) SARS-CoV-2 target nucleic acids are NOT DETECTED.  The SARS-CoV-2 RNA is generally detectable in upper respiratory specimens during the acute phase of infection. The lowest concentration of SARS-CoV-2 viral copies this assay can detect is 138 copies/mL. A negative result does not preclude SARS-Cov-2 infection and should not be used as the sole basis for treatment or other patient management decisions. A negative result may occur with  improper specimen collection/handling, submission of specimen other than nasopharyngeal swab, presence of viral mutation(s) within the areas targeted by this assay, and inadequate number of viral copies(<138 copies/mL). A negative result must be combined with clinical observations, patient history, and epidemiological information. The expected result is Negative.  Fact Sheet for Patients:  EntrepreneurPulse.com.au  Fact Sheet for Healthcare Providers:  IncredibleEmployment.be  This test is no t yet approved or cleared by the [place] FDA and  has been authorized for detection and/or diagnosis of SARS-CoV-2 by FDA under an Emergency Use Authorization (EUA). This EUA will remain  in effect (meaning this test can be used) for the duration of the COVID-19 declaration under Section  564(b)(1) of the Act, 21 U.S.C.section 360bbb-3(b)(1), unless the authorization is terminated  or revoked sooner.       Influenza A by PCR NEGATIVE NEGATIVE Final   Influenza B by PCR NEGATIVE NEGATIVE Final    Comment: (NOTE) The Xpert Xpress SARS-CoV-2/FLU/RSV plus assay is intended as an aid in the diagnosis of influenza from Nasopharyngeal swab specimens and should not be used as a sole basis for treatment. Nasal washings and aspirates are unacceptable for Xpert Xpress SARS-CoV-2/FLU/RSV testing.  Fact Sheet for Patients: EntrepreneurPulse.com.au  Fact Sheet for Healthcare Providers: IncredibleEmployment.be  This test is not yet approved or cleared by the [place] FDA and has been authorized for detection and/or diagnosis of SARS-CoV-2 by FDA under an Emergency Use Authorization (EUA). This EUA will remain in effect (meaning this test can be used)  for the duration of the COVID-19 declaration under Section 564(b)(1) of the Act, 21 U.S.C. section 360bbb-3(b)(1), unless the authorization is terminated or revoked.  Performed at [facility], [address] [place], [place] [address]   CULTURE, BLOOD (ROUTINE X 2) w Reflex to ID Panel     Status: None (Preliminary result)   Collection Time: 01/05/22  9:26 AM   Specimen: BLOOD  Result Value Ref Range Status   Specimen Description BLOOD RIGHT ANTECUBITAL  Final   Special Requests   Final    Blood Culture results may not be optimal due to an excessive volume of blood received in culture bottles   Culture   Final    NO GROWTH < 24 HOURS Performed at [facility], [address] [place], [place] [address]    Report Status PENDING  Incomplete  CULTURE, BLOOD (ROUTINE X 2) w Reflex to ID Panel     Status: None (Preliminary result)   Collection Time: 01/05/22  9:26 AM   Specimen: BLOOD  Result Value Ref Range Status   Specimen Description BLOOD BLOOD RIGHT HAND   Final   Special Requests   Final    BOTTLES DRAWN AEROBIC AND ANAEROBIC Blood Culture results may not be optimal due to an excessive volume of blood received in culture bottles   Culture   Final    NO GROWTH < 24 HOURS Performed at [facility], [address], [place], [place] [address]    Report Status PENDING  Incomplete         Radiology Studies: DG Chest Port 1 View  Result Date: 01/05/2022 CLINICAL DATA:  Weakness and lightheadedness. EXAM: PORTABLE CHEST 1 VIEW COMPARISON:  CT of the chest on 11/13/2021 FINDINGS: The heart size and mediastinal contours are within normal limits. Mild pulmonary interstitial prominence without overt edema, pleural fluid or pneumothorax. Mild underlying pulmonary interstitial edema cannot be excluded. There is atelectasis at both lung bases, right greater than left. The visualized skeletal structures are unremarkable. IMPRESSION: Mild pulmonary interstitial prominence may represent mild underlying pulmonary interstitial edema. Bibasilar atelectasis, right greater than left. Electronically Signed   By: Aletta Edouard M.D.   On: 01/05/2022 09:07        Scheduled Meds:  amLODipine  5 mg Oral Daily   atorvastatin  80 mg Oral Daily   ferrous sulfate  325 mg Oral Q breakfast   folic acid  1 mg Oral Daily   magnesium gluconate  250 mg Oral QHS   metoprolol tartrate  5 mg Intravenous Once   pantoprazole (PROTONIX) IV  40 mg Intravenous Q24H   ticagrelor  90 mg Oral BID   Continuous Infusions:   LOS: 1 day    Time spent: 32 mins     Wyvonnia Dusky, MD [facility] Pager [phone]  If 7PM-7AM, please contact night-coverage 01/06/2022, 8:49 AM

## 2022-01-06 NOTE — Progress Notes (Signed)
°   01/06/22 0244  Assess: MEWS Score  Temp 99.1 F (37.3 C)  BP (!) 115/57  Pulse Rate 73  Resp 17  SpO2 92 %  O2 Device Room Air  Assess: MEWS Score  MEWS Temp 0  MEWS Systolic 0  MEWS Pulse 0  MEWS RR 0  MEWS LOC 0  MEWS Score 0  MEWS Score Color Green  Assess: if the MEWS score is Yellow or Red  Were vital signs taken at a resting state? Yes  Focused Assessment No change from prior assessment  Does the patient meet 2 or more of the SIRS criteria? No  MEWS guidelines implemented *See Row Information* No, previously yellow, continue vital signs every 4 hours  Assess: SIRS CRITERIA  SIRS Temperature  0  SIRS Pulse 0  SIRS Respirations  0  SIRS WBC 0  SIRS Score Sum  0

## 2022-01-06 NOTE — Progress Notes (Signed)
PT Cancellation Note  Patient Details Name: Andrea Williamson MRN: [id number] DOB: Apr 13, 1951   Cancelled Treatment:    Reason Eval/Treat Not Completed: Patient at procedure or test/unavailable Pt out of room for IVC filter placement.  Will maintain on caseload and attempt to see when when appropriate.  Kreg Shropshire, DPT 01/06/2022, 3:08 PM

## 2022-01-06 NOTE — Interval H&P Note (Signed)
History and Physical Interval Note:  01/06/2022 2:56 PM  Andrea Williamson  has presented today for surgery, with the diagnosis of Deep Vein Thrombosis.  The various methods of treatment have been discussed with the patient and family. After consideration of risks, benefits and other options for treatment, the patient has consented to  Procedure(s): IVC FILTER INSERTION (N/A) as a surgical intervention.  The patient's history has been reviewed, patient examined, no change in status, stable for surgery.  I have reviewed the patient's chart and labs.  Questions were answered to the patient's satisfaction.     Leotis Pain

## 2022-01-06 NOTE — Progress Notes (Signed)
°   01/06/22 0044  Assess: MEWS Score  Temp (!) 103.1 F (39.5 C)  BP (!) 159/86  Pulse Rate 83  Resp 16  SpO2 98 %  O2 Device Room Air  Assess: MEWS Score  MEWS Temp 2  MEWS Systolic 0  MEWS Pulse 0  MEWS RR 0  MEWS LOC 0  MEWS Score 2  MEWS Score Color Yellow  Assess: if the MEWS score is Yellow or Red  Were vital signs taken at a resting state? Yes  Focused Assessment Change from prior assessment (see assessment flowsheet)  Does the patient meet 2 or more of the SIRS criteria? No  MEWS guidelines implemented *See Row Information* Yes  Treat  MEWS Interventions Administered scheduled meds/treatments  Pain Scale 0-10  Pain Score 0  Complains of Fever  Interventions Medication (see MAR)  Take Vital Signs  Increase Vital Sign Frequency  Yellow: Q 2hr X 2 then Q 4hr X 2, if remains yellow, continue Q 4hrs  Escalate  MEWS: Escalate Yellow: discuss with charge nurse/RN and consider discussing with provider and RRT  Notify: Charge Nurse/RN  Name of Charge Nurse/RN Notified Siri Cole, RN  Date Charge Nurse/RN Notified 01/06/22  Notify: Provider  Provider Name/Title Neomia Glass, RN  Date Provider Notified 01/06/22  Time Provider Notified [phone]  Notification Type Page  Notification Reason Change in status (temp)  Assess: SIRS CRITERIA  SIRS Temperature  1  SIRS Pulse 0  SIRS Respirations  0  SIRS WBC 0  SIRS Score Sum  1   Continue to monitor

## 2022-01-07 ENCOUNTER — Encounter: Payer: Self-pay | Admitting: Vascular Surgery

## 2022-01-07 ENCOUNTER — Other Ambulatory Visit: Payer: Self-pay | Admitting: *Deleted

## 2022-01-07 ENCOUNTER — Inpatient Hospital Stay: Payer: Medicare Other | Admitting: Neurology

## 2022-01-07 ENCOUNTER — Ambulatory Visit: Payer: Medicare Other

## 2022-01-07 DIAGNOSIS — D5911 Warm autoimmune hemolytic anemia: Secondary | ICD-10-CM | POA: Diagnosis not present

## 2022-01-07 DIAGNOSIS — D649 Anemia, unspecified: Secondary | ICD-10-CM | POA: Diagnosis not present

## 2022-01-07 DIAGNOSIS — I82403 Acute embolism and thrombosis of unspecified deep veins of lower extremity, bilateral: Secondary | ICD-10-CM | POA: Diagnosis not present

## 2022-01-07 DIAGNOSIS — D5919 Other autoimmune hemolytic anemia: Secondary | ICD-10-CM

## 2022-01-07 DIAGNOSIS — R509 Fever, unspecified: Secondary | ICD-10-CM

## 2022-01-07 LAB — BASIC METABOLIC PANEL
Anion gap: 9 (ref 5–15)
BUN: 20 mg/dL (ref 8–23)
CO2: 22 mmol/L (ref 22–32)
Calcium: 8.7 mg/dL — ABNORMAL LOW (ref 8.9–10.3)
Chloride: 105 mmol/L (ref 98–111)
Creatinine, Ser: 1.35 mg/dL — ABNORMAL HIGH (ref 0.44–1.00)
GFR, Estimated: 42 mL/min — ABNORMAL LOW (ref >60)
Glucose, Bld: 110 mg/dL — ABNORMAL HIGH (ref 70–99)
Potassium: 3.2 mmol/L — ABNORMAL LOW (ref 3.5–5.1)
Sodium: 136 mmol/L (ref 135–145)

## 2022-01-07 LAB — PROCALCITONIN: Procalcitonin: 0.31 ng/mL

## 2022-01-07 LAB — CBC
HCT: 23.2 % — ABNORMAL LOW (ref 36.0–46.0)
Hemoglobin: 7.7 g/dL — ABNORMAL LOW (ref 12.0–15.0)
MCH: 34.8 pg — ABNORMAL HIGH (ref 26.0–34.0)
MCHC: 33.2 g/dL (ref 30.0–36.0)
MCV: 105 fL — ABNORMAL HIGH (ref 80.0–100.0)
Platelets: 334 10*3/uL (ref 150–400)
RBC: 2.21 MIL/uL — ABNORMAL LOW (ref 3.87–5.11)
RDW: 14.3 % (ref 11.5–15.5)
WBC: 9.3 10*3/uL (ref 4.0–10.5)
nRBC: 0.4 % — ABNORMAL HIGH (ref 0.0–0.2)

## 2022-01-07 MED ORDER — DOCUSATE SODIUM 100 MG PO CAPS
200.0000 mg | ORAL_CAPSULE | Freq: Two times a day (BID) | ORAL | Status: DC
  Administered 2022-01-07 – 2022-01-15 (×14): 200 mg via ORAL
  Filled 2022-01-07 (×16): qty 2

## 2022-01-07 MED ORDER — POTASSIUM CHLORIDE CRYS ER 20 MEQ PO TBCR
20.0000 meq | EXTENDED_RELEASE_TABLET | Freq: Once | ORAL | Status: AC
  Administered 2022-01-07: 20 meq via ORAL
  Filled 2022-01-07: qty 1

## 2022-01-07 NOTE — Evaluation (Signed)
Physical Therapy Evaluation Patient Details Name: Andrea Williamson MRN: [id number] DOB: 1951-09-02 Today's Date: 01/07/2022  History of Present Illness  a 71 y.o. female with medical history significant for autoimmune hemolytic anemia currently on rituximab, history of prior CVA (11/22) secondary to left M2 occlusion status post thrombectomy with T1C13 revascularization and left A2 stenting who was initially placed on dual antiplatelet therapy. IVC filter placed 01/06/22.  Clinical Impression  Pt did well with all aspects of mobiltiy, ambulation, balance, etc and was easily able to circumambulate the nurses' station X 2 with community appropriate speed and minimal reliance on the walker. 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  She does not require further PT intervention.        Recommendations for follow up therapy are one component of a multi-disciplinary discharge planning process, led by the attending physician.  Recommendations may be updated based on patient status, additional functional criteria and insurance authorization.  Follow Up Recommendations No PT follow up    Assistance Recommended at Discharge PRN  Patient can return home with the following  Assist for transportation    Equipment Recommendations None recommended by PT  Recommendations for Other Services       Functional Status Assessment Patient has not had a recent decline in their functional status     Precautions / Restrictions Precautions Precautions: Fall Restrictions Weight Bearing Restrictions: No      Mobility  Bed Mobility Overal bed mobility: Independent             General bed mobility comments: easily gets to sitting EOB w/o assist    Transfers Overall transfer level: Modified independent Equipment used: Rolling walker (2 wheels)               General transfer comment: Pt able to rise  w/o assist, minimal UE reliance, did stabilize on walker but was not overly reliant on it/    Ambulation/Gait Ambulation/Gait assistance: Modified independent (Device/Increase time) Gait Distance (Feet): 350 Feet Assistive device: Rolling walker (2 wheels)         General Gait Details: Pt able to ambulate with confident and appropriate cadence, did wish to use the walker t/o the effort (states she does not always use one at home) but had no balance, safety, fatigue or other issues with community appropriate speed.  Stairs            Wheelchair Mobility    Modified Rankin (Stroke Patients Only)       Balance Overall balance assessment: Modified Independent                                           Pertinent Vitals/Pain Pain Assessment Pain Assessment: No/denies pain    Home Living Family/patient expects to be discharged to:: Private residence Living Arrangements: Spouse/significant other Available Help at Discharge: Family;Available 24 hours/day Type of Home: House Home Access: Stairs to enter   [employer] of Steps: 2   Home Layout: One level Home Equipment: Conservation officer, nature (2 wheels)      Prior Function Prior Level of Function : Independent/Modified Independent;Driving             Mobility Comments: Reports she does not drive but will walk around the block, run light errands, etc  Hand Dominance        Extremity/Trunk Assessment   Upper Extremity Assessment Upper Extremity Assessment: Generalized weakness    Lower Extremity Assessment Lower Extremity Assessment: Generalized weakness       Communication   Communication: Expressive difficulties  Cognition Arousal/Alertness: Awake/alert Behavior During Therapy: WFL for tasks assessed/performed Overall Cognitive Status: Within Functional Limits for tasks assessed                                          General Comments      Exercises      Assessment/Plan    PT Assessment Patient does not need any further PT services  PT Problem List Decreased activity tolerance;Decreased balance;Decreased safety awareness;Decreased cognition       PT Treatment Interventions Gait training;DME instruction;Stair training;Functional mobility training;Therapeutic activities;Therapeutic exercise;Balance training;Neuromuscular re-education;Cognitive remediation;Patient/family education    PT Goals (Current goals can be found in the Care Plan section)  Acute Rehab PT Goals Patient Stated Goal: go home PT Goal Formulation: All assessment and education complete, DC therapy    Frequency       Co-evaluation               AM-PAC PT "6 Clicks" Mobility  Outcome Measure Help needed turning from your back to your side while in a flat bed without using bedrails?: None Help needed moving from lying on your back to sitting on the side of a flat bed without using bedrails?: None Help needed moving to and from a bed to a chair (including a wheelchair)?: None Help needed standing up from a chair using your arms (e.g., wheelchair or bedside chair)?: None Help needed to walk in hospital room?: None Help needed climbing 3-5 steps with a railing? : None 6 Click Score: 24    End of Session Equipment Utilized During Treatment: Gait belt Activity Tolerance: Patient tolerated treatment well Patient left: with call bell/phone within reach;with chair alarm set Nurse Communication: Mobility status PT Visit Diagnosis: Muscle weakness (generalized) (M62.81);Difficulty in walking, not elsewhere classified (R26.2)    Time: 0093-8182 PT Time Calculation (min) (ACUTE ONLY): 19 min   Charges:   PT Evaluation $PT Eval Low Complexity: 1 Low PT Treatments $Gait Training: 8-22 mins        Kreg Shropshire, DPT 01/07/2022, 10:41 AM

## 2022-01-07 NOTE — Evaluation (Signed)
Occupational Therapy Evaluation Patient Details Name: Andrea Williamson MRN: [id number] DOB: 11-Aug-1951 Today's Date: 01/07/2022   History of Present Illness 71 y.o. female with medical history significant for autoimmune hemolytic anemia currently on rituximab, history of prior CVA (11/22) secondary to left M2 occlusion status post thrombectomy with T1C13 revascularization and left A2 stenting who was initially placed on dual antiplatelet therapy. IVC filter placed 01/06/22.   Clinical Impression   Pt seen for OT evaluation this date. She reports being INDEP at baseline. She presents this date with deficits with [facility]. Her B UE are tremulous, but in addition, her grip strength is only ~4-/5 and she has difficulty pinching and grasping small ADL items such as coins or grasping a pen and legibly writing her name. She requires MIN A for [facility] tasks, but is otherwise able to mobilize at her baseline of INDEP. She completes ADL transfers with no assist, no AD, and no LOB. Pt returned to bed end of session with her family member present. Does not require any further acute OT services, but recommend that she f/u w/ OPOT for [facility] skills.      Recommendations for follow up therapy are one component of a multi-disciplinary discharge planning process, led by the attending physician.  Recommendations may be updated based on patient status, additional functional criteria and insurance authorization.   Follow Up Recommendations  Outpatient OT    Assistance Recommended at Discharge PRN  Patient can return home with the following      Functional Status Assessment  Patient has had a recent decline in their functional status and demonstrates the ability to make significant improvements in function in a reasonable and predictable amount of time.  Equipment Recommendations  None recommended by OT    Recommendations for Other Services       Precautions / Restrictions Precautions Precautions: Fall Restrictions Weight  Bearing Restrictions: No      Mobility Bed Mobility Overal bed mobility: Independent                  Transfers Overall transfer level: Modified independent                        Balance Overall balance assessment: Modified Independent                                         ADL either performed or assessed with clinical judgement   ADL                                         General ADL Comments: requires MIN A for [facility] tasks, otherwise MOD I.     Vision Patient Visual Report: No change from baseline       Perception     Praxis      Pertinent Vitals/Pain Pain Assessment Pain Assessment: No/denies pain     Hand Dominance Right   Extremity/Trunk Assessment Upper Extremity Assessment Upper Extremity Assessment: Generalized weakness;RUE deficits/detail;LUE deficits/detail RUE Deficits / Details: tremulous with R worse than L, difficulty wtih grasping, writing, pinching, etc RUE Coordination: decreased fine motor LUE Deficits / Details: less difficulty wtih [facility] tasks on L side, but still tremulous   Lower Extremity Assessment Lower Extremity Assessment: Generalized weakness  Communication Communication Communication: Expressive difficulties   Cognition Arousal/Alertness: Awake/alert Behavior During Therapy: WFL for tasks assessed/performed Overall Cognitive Status: Within Functional Limits for tasks assessed                                       General Comments       Exercises Other Exercises Other Exercises: OT ed re: role and potential f/u needs if her grasp does not improve.   Shoulder Instructions      Home Living Family/patient expects to be discharged to:: Private residence Living Arrangements: Spouse/significant other Available Help at Discharge: Family;Available 24 hours/day Type of Home: House Home Access: Stairs to enter [employer] of Steps: 2 Entrance  Stairs-Rails: Can reach both;Right;Left Home Layout: One level     Bathroom Shower/Tub: Teacher, early years/pre: Standard Bathroom Accessibility: Yes   Home Equipment: Conservation officer, nature (2 wheels)   Additional Comments: at first tells me she has no equipment, then later tells me "I have all the equipment under the sun after my stroke"      Prior Functioning/Environment Prior Level of Function : Independent/Modified Independent;Driving             Mobility Comments: Reports she does not drive but will walk around the block, run light errands, etc          OT Problem List: Decreased coordination      OT Treatment/Interventions:      OT Goals(Current goals can be found in the care plan section) Acute Rehab OT Goals Patient Stated Goal: go home OT Goal Formulation: All assessment and education complete, DC therapy  OT Frequency:      Co-evaluation              AM-PAC OT "6 Clicks" Daily Activity     Outcome Measure Help from another person eating meals?: A Little Help from another person taking care of personal grooming?: A Little Help from another person toileting, which includes using toliet, bedpan, or urinal?: None Help from another person bathing (including washing, rinsing, drying)?: None Help from another person to put on and taking off regular upper body clothing?: A Little Help from another person to put on and taking off regular lower body clothing?: A Little 6 Click Score: 20   End of Session Nurse Communication: Mobility status;Other (comment) [facility])  Activity Tolerance: Patient tolerated treatment well Patient left: in bed;with call bell/phone within reach;with family/visitor present  OT Visit Diagnosis: Other (comment) (R27.8 other lack of coordination)                Time: 4709-6283 OT Time Calculation (min): 8 min Charges:  OT General Charges $OT Visit: 1 Visit OT Evaluation $OT Eval Low Complexity: [place], Mallory,  OTR/L ascom [phone] 01/07/22, 4:02 PM

## 2022-01-07 NOTE — Progress Notes (Signed)
°   01/06/22 2053  Assess: MEWS Score  Temp (!) 101.5 F (38.6 C)  BP (!) 157/75  Pulse Rate 93  Resp 20  SpO2 94 %  O2 Device Room Air  Assess: MEWS Score  MEWS Temp 2  MEWS Systolic 0  MEWS Pulse 0  MEWS RR 0  MEWS LOC 0  MEWS Score 2  MEWS Score Color Yellow  Assess: if the MEWS score is Yellow or Red  Were vital signs taken at a resting state? Yes  Focused Assessment No change from prior assessment  Does the patient meet 2 or more of the SIRS criteria? Yes  Treat  MEWS Interventions Administered prn meds/treatments  Assess: SIRS CRITERIA  SIRS Temperature  1  SIRS Pulse 1  SIRS Respirations  0  SIRS WBC 0  SIRS Score Sum  2

## 2022-01-07 NOTE — Progress Notes (Addendum)
PROGRESS NOTE    Andrea Williamson  LDJ:[id number] DOB: 08/01/1951 DOA: 01/05/2022 PCP: [name], MD   Assessment & Plan:   Principal Problem:   Symptomatic anemia Active Problems:   History of stroke   Essential hypertension   Warm autoimmune hemolytic anemia (HCC)   DVT (deep venous thrombosis) (HCC)   Fever   Symptomatic & acute blood loss anemia: etiology unclear. W/ drop in H&H over last 3 weeks from 11.7 to 8.1. Continue on PPI. Not likely GI bleed as per GI. Continue on B12 as level was low normal   Fever: etiology unclear. Blood cxs NGTD. CXR shows pulmonary interstitial edema no pneumonia. COVID19, influenza are both neg. Hold off abxs for now. ID consulted   DVT: s/p IVC filtered placed 01/06/22 by vascular surg. Continue to hold eliquis   Hx warm autoimmune hemolytic anemia: was on rituxan w/ last dose given 12/09/21. No hemolysis as per onco. 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 Continue on brilinta and continue to hold eliquis   Hypokalemia: potassium given     DVT prophylaxis:  SCDs Code Status: full  Family Communication: discussed pt's care w/ pt's family at bedside and answered their questions  Disposition Plan: likely d/c home    Level of care: Med-Surg  Status is: Inpatient Remains inpatient appropriate because: still spiking fevers, ID consulted      Consultants:  Neuro Onco  GI  ID  Procedures:   Antimicrobials:   Subjective: Pt c/o feeling cold   Objective: Vitals:   01/06/22 2053 01/06/22 2305 01/07/22 0331 01/07/22 0506  BP: (!) 157/75 129/68  137/63  Pulse: 93 72  80  Resp: 20 18  16   Temp: (!) 101.5 F (38.6 C) 99 F (37.2 C) (!) 100.9 F (38.3 C) 99.1 F (37.3 C)  TempSrc:  Oral Oral   SpO2: 94% 94%  90%  Weight:      Height:        Intake/Output Summary (Last 24 hours) at  01/07/2022 0802 Last data filed at 01/06/2022 1800 Gross per 24 hour  Intake 225 ml  Output --  Net 225 ml   Filed Weights   01/04/22 1830  Weight: 73.3 kg    Examination:  General exam: Appears comfortable   Respiratory system: clear breath sounds b/l  Cardiovascular system: S1/S2+. No rubs or gallops  Gastrointestinal system: Abd is soft, NT, ND & hypoactive bowel sounds  Central nervous system: alert and awake. Moves all extremities   Psychiatry: Judgement and insight appears at baseline. Agitated mood and affect     Data Reviewed: I have personally reviewed following labs and imaging studies  CBC: Recent Labs  Lab 01/04/22 1833 01/05/22 0926 01/05/22 1806 01/06/22 0558 01/07/22 0408  WBC 9.6 9.3  --  8.2 9.3  HGB 9.2* 8.1* 7.8* 8.0* 7.7*  HCT 27.5* 24.2* 23.0* 24.6* 23.2*  MCV 105.8* 106.1*  --  108.4* 105.0*  PLT 310 291  --  312 030   Basic Metabolic Panel: Recent Labs  Lab 01/04/22 1833 01/06/22 0558 01/07/22 0408  NA 131* 140 136  K 3.8 3.5 3.2*  CL 100 106 105  CO2 21* 23 22  GLUCOSE 109* 116* 110*  BUN 33* 23 20  CREATININE 1.65* 1.20* 1.35*  CALCIUM 8.9 8.9 8.7*   GFR: Estimated Creatinine  Clearance: 36.4 mL/min (A) (by C-G formula based on SCr of 1.35 mg/dL (H)). Liver Function Tests: Recent Labs  Lab 01/05/22 0253  AST 26  ALT 32  ALKPHOS 49  BILITOT 1.0  PROT 6.4*  ALBUMIN 3.3*   No results for input(s): LIPASE, AMYLASE in the last 168 hours. No results for input(s): AMMONIA in the last 168 hours. Coagulation Profile: Recent Labs  Lab 01/04/22 1833  INR 1.4*   Cardiac Enzymes: No results for input(s): CKTOTAL, CKMB, CKMBINDEX, TROPONINI in the last 168 hours. BNP (last 3 results) No results for input(s): PROBNP in the last 8760 hours. HbA1C: No results for input(s): HGBA1C in the last 72 hours. CBG: Recent Labs  Lab 01/05/22 0844  GLUCAP 126*   Lipid Profile: No results for input(s): CHOL, HDL, LDLCALC, TRIG, CHOLHDL,  LDLDIRECT in the last 72 hours. Thyroid Function Tests: Recent Labs    01/06/22 0558  TSH 1.613   Anemia Panel: Recent Labs    01/04/22 1832 01/06/22 0558  VITAMINB12  --  339  FOLATE  --  30.0  FERRITIN  --  838*  TIBC  --  204*  IRON  --  20*  RETICCTPCT 3.1  --    Sepsis Labs: Recent Labs  Lab 01/05/22 0926 01/05/22 1307  LATICACIDVEN 2.0* 1.5    Recent Results (from the past 240 hour(s))  Resp Panel by RT-PCR (Flu A&B, Covid) Nasopharyngeal Swab     Status: None   Collection Time: 01/05/22  3:53 AM   Specimen: Nasopharyngeal Swab; Nasopharyngeal(NP) swabs in vial transport medium  Result Value Ref Range Status   SARS Coronavirus 2 by RT PCR NEGATIVE NEGATIVE Final    Comment: (NOTE) SARS-CoV-2 target nucleic acids are NOT DETECTED.  The SARS-CoV-2 RNA is generally detectable in upper respiratory specimens during the acute phase of infection. The lowest concentration of SARS-CoV-2 viral copies this assay can detect is 138 copies/mL. A negative result does not preclude SARS-Cov-2 infection and should not be used as the sole basis for treatment or other patient management decisions. A negative result may occur with  improper specimen collection/handling, submission of specimen other than nasopharyngeal swab, presence of viral mutation(s) within the areas targeted by this assay, and inadequate number of viral copies(<138 copies/mL). A negative result must be combined with clinical observations, patient history, and epidemiological information. The expected result is Negative.  Fact Sheet for Patients:  EntrepreneurPulse.com.au  Fact Sheet for Healthcare Providers:  IncredibleEmployment.be  This test is no t yet approved or cleared by the [place] FDA and  has been authorized for detection and/or diagnosis of SARS-CoV-2 by FDA under an Emergency Use Authorization (EUA). This EUA will remain  in effect (meaning this test  can be used) for the duration of the COVID-19 declaration under Section 564(b)(1) of the Act, 21 U.S.C.section 360bbb-3(b)(1), unless the authorization is terminated  or revoked sooner.       Influenza A by PCR NEGATIVE NEGATIVE Final   Influenza B by PCR NEGATIVE NEGATIVE Final    Comment: (NOTE) The Xpert Xpress SARS-CoV-2/FLU/RSV plus assay is intended as an aid in the diagnosis of influenza from Nasopharyngeal swab specimens and should not be used as a sole basis for treatment. Nasal washings and aspirates are unacceptable for Xpert Xpress SARS-CoV-2/FLU/RSV testing.  Fact Sheet for Patients: EntrepreneurPulse.com.au  Fact Sheet for Healthcare Providers: IncredibleEmployment.be  This test is not yet approved or cleared by the [place] FDA and has been authorized for detection and/or diagnosis  of SARS-CoV-2 by FDA under an Emergency Use Authorization (EUA). This EUA will remain in effect (meaning this test can be used) for the duration of the COVID-19 declaration under Section 564(b)(1) of the Act, 21 U.S.C. section 360bbb-3(b)(1), unless the authorization is terminated or revoked.  Performed at [facility], [address] [place], [place] [address]   CULTURE, BLOOD (ROUTINE X 2) w Reflex to ID Panel     Status: None (Preliminary result)   Collection Time: 01/05/22  9:26 AM   Specimen: BLOOD  Result Value Ref Range Status   Specimen Description BLOOD RIGHT ANTECUBITAL  Final   Special Requests   Final    Blood Culture results may not be optimal due to an excessive volume of blood received in culture bottles   Culture   Final    NO GROWTH 2 DAYS Performed at [facility], [address], [place], [place] [address]    Report Status PENDING  Incomplete  CULTURE, BLOOD (ROUTINE X 2) w Reflex to ID Panel     Status: None (Preliminary result)   Collection Time: 01/05/22  9:26 AM   Specimen: BLOOD  Result Value  Ref Range Status   Specimen Description BLOOD BLOOD RIGHT HAND  Final   Special Requests   Final    BOTTLES DRAWN AEROBIC AND ANAEROBIC Blood Culture results may not be optimal due to an excessive volume of blood received in culture bottles   Culture   Final    NO GROWTH 2 DAYS Performed at [facility], [address], [place], [place] [address]    Report Status PENDING  Incomplete         Radiology Studies: PERIPHERAL VASCULAR CATHETERIZATION  Result Date: 01/06/2022 See surgical note for result.  DG Chest Port 1 View  Result Date: 01/05/2022 CLINICAL DATA:  Weakness and lightheadedness. EXAM: PORTABLE CHEST 1 VIEW COMPARISON:  CT of the chest on 11/13/2021 FINDINGS: The heart size and mediastinal contours are within normal limits. Mild pulmonary interstitial prominence without overt edema, pleural fluid or pneumothorax. Mild underlying pulmonary interstitial edema cannot be excluded. There is atelectasis at both lung bases, right greater than left. The visualized skeletal structures are unremarkable. IMPRESSION: Mild pulmonary interstitial prominence may represent mild underlying pulmonary interstitial edema. Bibasilar atelectasis, right greater than left. Electronically Signed   By: Aletta Edouard M.D.   On: 01/05/2022 09:07        Scheduled Meds:  amLODipine  5 mg Oral Daily   atorvastatin  80 mg Oral Daily   cyanocobalamin  1,000 mcg Intramuscular Once   ferrous sulfate  325 mg Oral Q breakfast   folic acid  1 mg Oral Daily   magnesium gluconate  250 mg Oral QHS   metoprolol tartrate  5 mg Intravenous Once   pantoprazole (PROTONIX) IV  40 mg Intravenous Q24H   ticagrelor  90 mg Oral BID   vitamin B-12  100 mcg Oral Daily   Continuous Infusions:   LOS: 2 days    Time spent: 25 mins     Wyvonnia Dusky, MD [facility] Pager [phone]  If 7PM-7AM, please contact night-coverage 01/07/2022, 8:02 AM

## 2022-01-07 NOTE — Consult Note (Signed)
NAME: Andrea Williamson  DOB: 09/01/1951  MRN: [id number]  Date/Time: 01/07/2022 6:04 PM  REQUESTING PROVIDER: Dr.Williams Subjective:  REASON FOR CONSULT: FUO ? Andrea Williamson is a 71 y.o. 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 for rituxan infusion after blood transfusion. She also had bl lower extremity DVT CT abdomen and pelvis showed complex rt adnexal mass concerning for malignancy. Gyn saw her and because of  antiplatelet and eliquis chose to postpone any intervention for a few months  Pt gets very agitated when she is asked questions Says she was fine until Nov  and has gone down hill and very frustrated about the situation     Past Medical History:  Diagnosis Date   Deep vein thrombosis (DVT) ([place])    Hypertension    Stroke [facility])    HLD Past Surgical History:  Procedure Laterality Date   ANKLE SURGERY Left 2008   IR CT HEAD LTD  10/05/2021   IR INTRA CRAN STENT  10/01/2021   IR PERCUTANEOUS ART THROMBECTOMY/INFUSION INTRACRANIAL INC DIAG ANGIO  10/01/2021   IR  US GUIDE VASC ACCESS RIGHT  10/01/2021   IVC FILTER INSERTION N/A 01/06/2022   Procedure: IVC FILTER INSERTION;  Surgeon: Algernon Huxley, MD;  Location: [place] [facility];  Service: Cardiovascular;  Laterality: N/A;   RADIOLOGY WITH ANESTHESIA N/A 10/01/2021   Procedure: IR WITH ANESTHESIA;  Surgeon: Radiologist, Medication, MD;  Location: [place];  Service: Radiology;  Laterality: N/A;    Social History   Socioeconomic History   Marital status: Married    Spouse name: Not on file   Number of children: 3   Years of education: 12   Highest education level: 12th grade  Occupational History   Occupation: Retired  Tobacco Use   Smoking status: Former    Packs/day: 1.50    Years: 50.00    Pack years: 75.00    Types: Cigarettes    Quit date: 09/2021    Years since quitting: 0.3   Smokeless tobacco: Never  Vaping Use   Vaping Use: Never used  Substance and Sexual Activity   Alcohol use: Not Currently   Drug use: Not Currently   Sexual activity: Not Currently  Other Topics Concern   Not on file  Social History Narrative   October 2022 stopped   Social Determinants of Health   Financial Resource Strain: Low Risk    Difficulty of Paying Living Expenses: Not hard at all  Food Insecurity: No  Food Insecurity   Worried About Charity fundraiser in the Last Year: Never true   Ran Out of Food in the Last Year: Never true  Transportation Needs: No Transportation Needs   Lack of Transportation (Medical): No   Lack of Transportation (Non-Medical): No  Physical Activity: Insufficiently Active   Days of Exercise per Week: 2 days   Minutes of Exercise per Session: 60 min  Stress: Stress Concern Present   Feeling of Stress : To some extent  Social Connections: Moderately Isolated   Frequency of Communication with Friends and Family: More than three times a week   Frequency of Social Gatherings with Friends and Family: More than three times a week   Attends Religious Services: Never    Marine scientist or Organizations: No   Attends Music therapist: Never   Marital Status: Married  Human resources officer Violence: Not At Risk   Fear of Current or Ex-Partner: No   Emotionally Abused: No   Physically Abused: No   Sexually Abused: No    Family History  Problem Relation Age of Onset   COPD Mother    Cancer Mother    No Known Allergies I? Current Facility-Administered Medications  Medication Dose Route Frequency Provider Last Rate Last Admin   acetaminophen (TYLENOL) tablet 650 mg  650 mg Oral Q6H PRN Algernon Huxley, MD   650 mg at 01/07/22 1217   Or   acetaminophen (TYLENOL) suppository 650 mg  650 mg Rectal Q6H PRN Algernon Huxley, MD       ALPRAZolam Duanne Moron) tablet 0.25 mg  0.25 mg Oral QHS PRN Algernon Huxley, MD       amLODipine (NORVASC) tablet 5 mg  5 mg Oral Daily Algernon Huxley, MD   5 mg at 01/07/22 0950   atorvastatin (LIPITOR) tablet 80 mg  80 mg Oral Daily Algernon Huxley, MD   80 mg at 01/07/22 4097   cyanocobalamin ((VITAMIN B-12)) injection 1,000 mcg  1,000 mcg Intramuscular Once Algernon Huxley, MD       docusate sodium (COLACE) capsule 200 mg  200 mg Oral BID Wyvonnia Dusky, MD   200 mg at 01/07/22 1217   ferrous sulfate tablet 325 mg  325 mg Oral Q breakfast Algernon Huxley, MD   325 mg at 35/32/99 2426   folic acid (FOLVITE) tablet 1 mg  1 mg Oral Daily Algernon Huxley, MD   1 mg at 01/07/22 0950   HYDROmorphone (DILAUDID) injection 1 mg  1 mg Intravenous Once PRN Algernon Huxley, MD       magnesium gluconate (MAGONATE) tablet 250 mg  250 mg Oral QHS Algernon Huxley, MD   250 mg at 01/05/22 2039   metoprolol tartrate (LOPRESSOR) injection 5 mg  5 mg Intravenous Once Algernon Huxley, MD       ondansetron [facility]) tablet 4 mg  4 mg Oral Q6H PRN Algernon Huxley, MD       Or   ondansetron (ZOFRAN) injection 4 mg  4 mg Intravenous Q6H PRN Algernon Huxley, MD       pantoprazole (PROTONIX) injection 40 mg  40 mg Intravenous Q24H Algernon Huxley, MD   40 mg at 01/07/22 0955    ticagrelor (BRILINTA) tablet 90 mg  90 mg Oral BID Algernon Huxley, MD   90 mg at 01/07/22 8341   vitamin B-12 (CYANOCOBALAMIN) tablet 100 mcg  100 mcg Oral Daily Dew,  Erskine Squibb, MD   100 mcg at 01/07/22 9326     Abtx:  Anti-infectives (From admission, onward)    Start     Dose/Rate Route Frequency Ordered Stop   01/07/22 0600  ceFAZolin (ANCEF) IVPB 2g/100 mL premix  Status:  Discontinued        2 g 200 mL/hr over 30 Minutes Intravenous On call to O.R. 01/06/22 1411 01/06/22 1634       REVIEW OF SYSTEMS:  Const: she does not feel the  fever, negative chills,  Eyes: negative diplopia or visual changes, negative eye pain ENT: negative coryza, negative sore throat Resp: some cough, no hemoptysis, dyspnea Cards: negative for chest pain, palpitations, lower extremity edema GU: negative for frequency, dysuria and hematuria GI: Negative for abdominal pain, diarrhea, bleeding, constipation Skin: negative for rash and pruritus Heme: negative for easy bruising and gum/nose bleeding MS: weakness Neurolo:tremors both hands Psych:  anxiety, depression  Endocrine: negative for thyroid, diabetes Allergy/Immunology- negative for any medication or food allergies ? Objective:  VITALS:  BP 132/68 (BP Location: Right Arm)    Pulse 89    Temp 98.7 F (37.1 C) (Oral)    Resp 17    Ht 5\' 2"  (1.575 m)    Wt 73.3 kg    SpO2 91%    BMI 29.56 kg/m  PHYSICAL EXAM:  General: Awake but gets agitated and anxious and has increasing tremors hands when I talk to her about her medical condition Head: Normocephalic, without obvious abnormality, atraumatic. Eyes:left eye some stickiness of lids ENT Nares normal. No drainage or sinus tenderness. Lips, mucosa, and tongue normal. No Thrush Neck:  symmetrical, no adenopathy, thyroid: non tender no carotid bruit and no JVD. Back: No CVA tenderness. Lungs: b/la ir entry Heart: Regular rate and rhythm, no murmur, rub or gallop. Abdomen: Soft, non-tender,not  distended. Bowel sounds normal. No masses Extremities: atraumatic, no cyanosis. No edema. No clubbing Skin: No rashes or lesions. Or bruising Lymph: Cervical, supraclavicular normal. Neurologic: did nto assess in detail Pertinent Labs Lab Results CBC    Component Value Date/Time   WBC 9.3 01/07/2022 0408   RBC 2.21 (L) 01/07/2022 0408   HGB 7.7 (L) 01/07/2022 0408   HGB 8.7 (L) 11/13/2021 1508   HCT 23.2 (L) 01/07/2022 0408   PLT 334 01/07/2022 0408   MCV 105.0 (H) 01/07/2022 0408   MCH 34.8 (H) 01/07/2022 0408   MCHC 33.2 01/07/2022 0408   RDW 14.3 01/07/2022 0408   LYMPHSABS 0.7 12/23/2021 1005   MONOABS 1.0 12/23/2021 1005   EOSABS 0.0 12/23/2021 1005   BASOSABS 0.1 12/23/2021 1005    CMP Latest Ref Rng & Units 01/07/2022 01/06/2022 01/05/2022  Glucose 70 - 99 mg/dL 110(H) 116(H) -  BUN 8 - 23 mg/dL 20 23 -  Creatinine 0.44 - 1.00 mg/dL 1.35(H) 1.20(H) -  Sodium 135 - 145 mmol/L 136 140 -  Potassium 3.5 - 5.1 mmol/L 3.2(L) 3.5 -  Chloride 98 - 111 mmol/L 105 106 -  CO2 22 - 32 mmol/L 22 23 -  Calcium 8.9 - 10.3 mg/dL 8.7(L) 8.9 -  Total Protein 6.5 - 8.1 g/dL - - 6.4(L)  Total Bilirubin 0.3 - 1.2 mg/dL - - 1.0  Alkaline Phos 38 - 126 U/L - - 49  AST 15 - 41 U/L - - 26  ALT 0 - 44 U/L - - 32      Microbiology: Recent Results (from the past 240 hour(s))  Resp Panel by RT-PCR (Flu  A&B, Covid) Nasopharyngeal Swab     Status: None   Collection Time: 01/05/22  3:53 AM   Specimen: Nasopharyngeal Swab; Nasopharyngeal(NP) swabs in vial transport medium  Result Value Ref Range Status   SARS Coronavirus 2 by RT PCR NEGATIVE NEGATIVE Final    Comment: (NOTE) SARS-CoV-2 target nucleic acids are NOT DETECTED.  The SARS-CoV-2 RNA is generally detectable in upper respiratory specimens during the acute phase of infection. The lowest concentration of SARS-CoV-2 viral copies this assay can detect is 138 copies/mL. A negative result does not preclude SARS-Cov-2 infection and  should not be used as the sole basis for treatment or other patient management decisions. A negative result may occur with  improper specimen collection/handling, submission of specimen other than nasopharyngeal swab, presence of viral mutation(s) within the areas targeted by this assay, and inadequate number of viral copies(<138 copies/mL). A negative result must be combined with clinical observations, patient history, and epidemiological information. The expected result is Negative.  Fact Sheet for Patients:  EntrepreneurPulse.com.au  Fact Sheet for Healthcare Providers:  IncredibleEmployment.be  This test is no t yet approved or cleared by the [place] FDA and  has been authorized for detection and/or diagnosis of SARS-CoV-2 by FDA under an Emergency Use Authorization (EUA). This EUA will remain  in effect (meaning this test can be used) for the duration of the COVID-19 declaration under Section 564(b)(1) of the Act, 21 U.S.C.section 360bbb-3(b)(1), unless the authorization is terminated  or revoked sooner.       Influenza A by PCR NEGATIVE NEGATIVE Final   Influenza B by PCR NEGATIVE NEGATIVE Final    Comment: (NOTE) The Xpert Xpress SARS-CoV-2/FLU/RSV plus assay is intended as an aid in the diagnosis of influenza from Nasopharyngeal swab specimens and should not be used as a sole basis for treatment. Nasal washings and aspirates are unacceptable for Xpert Xpress SARS-CoV-2/FLU/RSV testing.  Fact Sheet for Patients: EntrepreneurPulse.com.au  Fact Sheet for Healthcare Providers: IncredibleEmployment.be  This test is not yet approved or cleared by the [place] FDA and has been authorized for detection and/or diagnosis of SARS-CoV-2 by FDA under an Emergency Use Authorization (EUA). This EUA will remain in effect (meaning this test can be used) for the duration of the COVID-19 declaration  under Section 564(b)(1) of the Act, 21 U.S.C. section 360bbb-3(b)(1), unless the authorization is terminated or revoked.  Performed at [facility], [address] [place], [place] [address]   CULTURE, BLOOD (ROUTINE X 2) w Reflex to ID Panel     Status: None (Preliminary result)   Collection Time: 01/05/22  9:26 AM   Specimen: BLOOD  Result Value Ref Range Status   Specimen Description BLOOD RIGHT ANTECUBITAL  Final   Special Requests   Final    Blood Culture results may not be optimal due to an excessive volume of blood received in culture bottles   Culture   Final    NO GROWTH 2 DAYS Performed at [facility], [address], [place], [place] [address]    Report Status PENDING  Incomplete  CULTURE, BLOOD (ROUTINE X 2) w Reflex to ID Panel     Status: None (Preliminary result)   Collection Time: 01/05/22  9:26 AM   Specimen: BLOOD  Result Value Ref Range Status   Specimen Description BLOOD BLOOD RIGHT HAND  Final   Special Requests   Final    BOTTLES DRAWN AEROBIC AND ANAEROBIC Blood Culture results may not be optimal due to an excessive  volume of blood received in culture bottles   Culture   Final    NO GROWTH 2 DAYS Performed at [facility], [address] [place], [place] [address]    Report Status PENDING  Incomplete    IMAGING RESULTS:  I have personally reviewed the films ? Impression/Recommendation ? ?Weakness and dizziness with anemia, fever, epistaxis before presentation with underlying recently diagnosed left ACA infacrt due to A2 thrombosis s/p thrombectomy and stent of left A 2 on ticagrelor and aspirin, followed by hemolytic anemia a month later and b/l DVT lower extremities- Dat positive and she was diagnosed as Warm AIHA but no secondary workup for AIHA  was done- she also was diagnosed with a complex cystic lesion Rt ovary concerning for malignancy. She was given high dose steroids and rituximab and 4th dose of the latter was  on 12/09/21 . Steroids have bene tapered and stopped around 12/30/21  New onset fever with no leucocytosis or neutropenia , neg blood culture, no pneumonia or urinary symptoms or skin lesions She had epistaxis before D>D Could this be a fever due to adrenal insufficiency as she was on steroids since Dec 29-Feb 15- though tapered and stopped- she will need am cortisol and cosyntropin test She was on rituxan which puts her at risk for infections secondary to hypogammaglobulinemia and reactivation of viruses including CMV, parvovirus, herpes virus or hepb virus- she has no corroborating signs -like abnormal LFTS or pancytopenia  will check PCR  Could it be from translocation of bacteria from the epistaxis? Could it be her underlying cause   Autoimmune hemolytic anemia with DAT + - was this warm, was this secondary to autoimmune condition or drugs or lymphoproliferative contition or infection? No autoimmune work up done DVT was present during the diagnosis of AIHA- this could be secondary to this or is there a connection with hr prior cranial arterial thrombosis  Mixed arterial and venous thrombosis are seen in paroxysmal nocturnal hemoglobinuria which causes hemolysis but it is complement mediated. Antiphospholipid antibodies can cause both arterial and venous thrombosis and can cause secondary AIHA Or is her thrombosis l related to hypercoagulable state from ovarian malignancy?   Tremors hands- ?  AKI  HLD ? _Acute A2 left ACA occulsion in NOV 2022- s/p thrombectomy and stent and on DAPT  Anxiety- and depression secondary to her illness- may benefit from psych counseling __________________________________________________ Discussed with patient, requesting provider Note:  This document was prepared using Dragon voice recognition software and may include unintentional dictation errors.

## 2022-01-08 DIAGNOSIS — I471 Supraventricular tachycardia: Secondary | ICD-10-CM

## 2022-01-08 DIAGNOSIS — D5911 Warm autoimmune hemolytic anemia: Secondary | ICD-10-CM | POA: Diagnosis not present

## 2022-01-08 DIAGNOSIS — R509 Fever, unspecified: Secondary | ICD-10-CM | POA: Diagnosis not present

## 2022-01-08 LAB — CBC
HCT: 23 % — ABNORMAL LOW (ref 36.0–46.0)
Hemoglobin: 7.7 g/dL — ABNORMAL LOW (ref 12.0–15.0)
MCH: 35 pg — ABNORMAL HIGH (ref 26.0–34.0)
MCHC: 33.5 g/dL (ref 30.0–36.0)
MCV: 104.5 fL — ABNORMAL HIGH (ref 80.0–100.0)
Platelets: 379 10*3/uL (ref 150–400)
RBC: 2.2 MIL/uL — ABNORMAL LOW (ref 3.87–5.11)
RDW: 14.4 % (ref 11.5–15.5)
WBC: 9.9 10*3/uL (ref 4.0–10.5)
nRBC: 0.7 % — ABNORMAL HIGH (ref 0.0–0.2)

## 2022-01-08 LAB — HEPATIC FUNCTION PANEL
ALT: 42 U/L (ref 0–44)
AST: 48 U/L — ABNORMAL HIGH (ref 15–41)
Albumin: 2.8 g/dL — ABNORMAL LOW (ref 3.5–5.0)
Alkaline Phosphatase: 62 U/L (ref 38–126)
Bilirubin, Direct: 0.1 mg/dL (ref 0.0–0.2)
Indirect Bilirubin: 0.7 mg/dL (ref 0.3–0.9)
Total Bilirubin: 0.8 mg/dL (ref 0.3–1.2)
Total Protein: 6.9 g/dL (ref 6.5–8.1)

## 2022-01-08 LAB — BASIC METABOLIC PANEL
Anion gap: 10 (ref 5–15)
BUN: 18 mg/dL (ref 8–23)
CO2: 24 mmol/L (ref 22–32)
Calcium: 8.9 mg/dL (ref 8.9–10.3)
Chloride: 104 mmol/L (ref 98–111)
Creatinine, Ser: 1.09 mg/dL — ABNORMAL HIGH (ref 0.44–1.00)
GFR, Estimated: 55 mL/min — ABNORMAL LOW (ref >60)
Glucose, Bld: 95 mg/dL (ref 70–99)
Potassium: 3.6 mmol/L (ref 3.5–5.1)
Sodium: 138 mmol/L (ref 135–145)

## 2022-01-08 LAB — PROCALCITONIN: Procalcitonin: 0.2 ng/mL

## 2022-01-08 LAB — CORTISOL-AM, BLOOD: Cortisol - AM: 22.6 ug/dL (ref 6.7–22.6)

## 2022-01-08 MED ORDER — METOPROLOL TARTRATE 5 MG/5ML IV SOLN
10.0000 mg | INTRAVENOUS | Status: DC
  Filled 2022-01-08: qty 10

## 2022-01-08 MED ORDER — ALPRAZOLAM 0.25 MG PO TABS
0.2500 mg | ORAL_TABLET | Freq: Once | ORAL | Status: DC
  Filled 2022-01-08: qty 1

## 2022-01-08 MED ORDER — CEFTRIAXONE SODIUM 2 G IJ SOLR
2.0000 g | Freq: Every day | INTRAMUSCULAR | Status: DC
  Administered 2022-01-08 – 2022-01-13 (×6): 2 g via INTRAVENOUS
  Filled 2022-01-08 (×6): qty 2

## 2022-01-08 MED ORDER — METRONIDAZOLE 500 MG/100ML IV SOLN
500.0000 mg | Freq: Two times a day (BID) | INTRAVENOUS | Status: DC
Start: 2022-01-08 — End: 2022-01-11
  Administered 2022-01-08 – 2022-01-11 (×6): 500 mg via INTRAVENOUS
  Filled 2022-01-08 (×7): qty 100

## 2022-01-08 MED ORDER — METOPROLOL TARTRATE 5 MG/5ML IV SOLN
5.0000 mg | Freq: Once | INTRAVENOUS | Status: AC
  Administered 2022-01-08: 13:00:00 5 mg via INTRAVENOUS

## 2022-01-08 NOTE — Progress Notes (Signed)
Date of Admission:  01/05/2022     ID: Andrea Williamson is a 71 y.o. female  Principal Problem:   Symptomatic anemia Active Problems:   History of stroke   Essential hypertension   Warm autoimmune hemolytic anemia (HCC)   DVT (deep venous thrombosis) (HCC)   Fever  Patient's husband at bedside Patient is very frustrated Had SVT this morning with heart rate of 177.  Broke with metoprolol. Fever curve trending down but is getting Tylenol frequently    Medications:   ALPRAZolam  0.25 mg Oral Once   amLODipine  5 mg Oral Daily   atorvastatin  80 mg Oral Daily   cyanocobalamin  1,000 mcg Intramuscular Once   docusate sodium  200 mg Oral BID   ferrous sulfate  325 mg Oral Q breakfast   folic acid  1 mg Oral Daily   magnesium gluconate  250 mg Oral QHS   pantoprazole (PROTONIX) IV  40 mg Intravenous Q24H   ticagrelor  90 mg Oral BID   vitamin B-12  100 mcg Oral Daily    Objective: Vital signs in last 24 hours: Patient Vitals for the past 24 hrs:  BP Temp Temp src Pulse Resp SpO2  01/08/22 1351 -- -- -- 96 -- 92 %  01/08/22 1300 (!) 154/86 98.4 F (36.9 C) -- (!) 176 20 (!) 89 %  01/08/22 1205 (!) 141/70 98.4 F (36.9 C) -- 83 16 94 %  01/08/22 0826 (!) 121/96 98.8 F (37.1 C) -- 84 18 97 %  01/08/22 0515 -- 98.4 F (36.9 C) -- -- -- --  01/08/22 0438 (!) 153/65 100.2 F (37.9 C) Oral 82 18 93 %  01/08/22 0015 (!) 143/72 98.1 F (36.7 C) Oral 82 18 94 %  01/07/22 2254 -- 98.4 F (36.9 C) Oral -- -- --  01/07/22 2117 106/74 (!) 101.4 F (38.6 C) -- 100 17 92 %  01/07/22 1630 132/68 98.7 F (37.1 C) Oral 89 17 --  01/07/22 1608 -- 98.6 F (37 C) -- -- -- --    PHYSICAL EXAM:  General: Awake, alert, frustrated.   Head: Normocephalic, without obvious abnormality, atraumatic. Eyes: Conjunctivae clear, anicteric sclerae. Pupils are equal ENT Nares normal. No drainage or sinus tenderness. Lips, mucosa, and tongue normal. No Thrush Neck:  symmetrical, no adenopathy,  thyroid: non tender no carotid bruit and no JVD. Back: No CVA tenderness. Lungs: Bilateral air entry Heart: Regular rate and rhythm Abdomen: Soft, non-tender,not distended. Bowel sounds normal. No masses Extremities: atraumatic, no cyanosis. No edema. No clubbing Skin: No rashes or lesions. Or bruising Lymph: Cervical, supraclavicular normal. Neurologic: Moves all extremities Involuntary tremors hands  Lab Results Recent Labs    01/07/22 0408 01/08/22 0408  WBC 9.3 9.9  HGB 7.7* 7.7*  HCT 23.2* 23.0*  NA 136 138  K 3.2* 3.6  CL 105 104  CO2 22 24  BUN 20 18  CREATININE 1.35* 1.09*   Microbiology: 2/21- BC- NG    Assessment/Plan: Weakness and dizziness with anemia, fever, epistaxis before presentation with underlying recently diagnosed left ACA infacrt due to A2 thrombosis s/p thrombectomy and stent of left A 2 on ticagrelor and aspirin, followed by hemolytic anemia a month later and b/l DVT lower extremities- Dat positive and she was diagnosed as Warm AIHA but no secondary workup for AIHA  was done- she also was diagnosed with a complex cystic lesion Rt ovary concerning for malignancy. She was given high dose steroids and rituximab  and 4th dose of the latter was on 12/09/21 . Steroids have bene tapered and stopped around 12/30/21   New onset fever with no leucocytosis or neutropenia , neg blood culture, no pneumonia or urinary symptoms or skin lesions Feer curve trending down- on no antibiotics but because of SVT this morning we will repeat blood culture.  We will start her on ceftriaxone and Flagyl as She had epistaxis before admission and possibility of translocation of bacteria from the epistaxis?  She was on rituxan which puts her at risk for infections secondary to hypogammaglobulinemia and reactivation of viruses including CMV, parvovirus, herpes virus or hepb virus- she has no corroborating signs -like abnormal LFTS or pancytopenia  - CMV/Parvo PCR sent  D.D Could this  be a fever due to adrenal insufficiency as she was on steroids since Dec 29-Feb 15- though tapered and stopped- Morning cortisol 22- so ruled out     Autoimmune hemolytic anemia with DAT + -diagnosis warm AIHA , was this secondary to autoimmune condition or drugs or lymphoproliferative contition or infection? DVT was present during the diagnosis of AIHA- this could be secondary to this or is there a connection with her prior cranial arterial thrombosis   Mixed arterial and venous thrombosis are seen in paroxysmal nocturnal hemoglobinuria which causes hemolysis but it is complement mediated and the diagnostics was negative as per neurologist heme. Antiphospholipid antibodies can cause both arterial and venous thrombosis and can cause secondary AIHA Await result Or is her thrombosis l related to hypercoagulable state from ovarian malignancy?    Tremors hands- ?   AKI   HLD ? _Acute A2 left ACA occulsion in NOV 2022- s/p thrombectomy and stent and on DAPT   Anxiety- and depression secondary to her illness- may benefit from psych counseling  Discussed the management with the patient, her husband and the care team. On-call ID is available remotely this weekend.  Call if needed From 01/11/2022 [facility] while I am away.  Contact information available on AMION

## 2022-01-08 NOTE — Progress Notes (Signed)
°   01/08/22 1300  Assess: MEWS Score  Temp 98.4 F (36.9 C)  BP (!) 154/86  Pulse Rate (!) 176  Resp 20  Level of Consciousness Alert  SpO2 (!) 89 %  O2 Device Nasal Cannula  O2 Flow Rate (L/min) 2 L/min  Assess: MEWS Score  MEWS Temp 0  MEWS Systolic 0  MEWS Pulse 3  MEWS RR 0  MEWS LOC 0  MEWS Score 3  MEWS Score Color Yellow  Assess: if the MEWS score is Yellow or Red  Were vital signs taken at a resting state? Yes  Focused Assessment Change from prior assessment (see assessment flowsheet)  Does the patient meet 2 or more of the SIRS criteria? No  Does the patient have a confirmed or suspected source of infection? No  MEWS guidelines implemented *See Row Information* No, vital signs rechecked  Take Vital Signs  Increase Vital Sign Frequency  Yellow: Q 2hr X 2 then Q 4hr X 2, if remains yellow, continue Q 4hrs  Escalate  MEWS: Escalate Yellow: discuss with charge nurse/RN and consider discussing with provider and RRT  Notify: Charge Nurse/RN  Name of Charge Nurse/RN Notified Gerald Stabs RN  Date Charge Nurse/RN Notified 01/08/22  Time Charge Nurse/RN Notified 1301  Notify: Provider  Provider Name/Title Dr Jimmye Norman  Date Provider Notified 01/08/22  Time Provider Notified 1300  Notification Type Page  Notification Reason Change in status  Provider response At bedside;See new orders  Date of Provider Response 01/08/22  Notify: Rapid Response  Name of Rapid Response RN Notified Belenda Cruise RN  Date Rapid Response Notified 01/08/22  Time Rapid Response Notified 1300  Document  Patient Outcome Stabilized after interventions  Assess: SIRS CRITERIA  SIRS Temperature  0  SIRS Pulse 1  SIRS Respirations  0  SIRS WBC 0  SIRS Score Sum  1

## 2022-01-08 NOTE — Progress Notes (Signed)
PROGRESS NOTE    Andrea Williamson  RCV:[id number] DOB: 05-Jan-1951 DOA: 01/05/2022 PCP: [name], MD   Assessment & Plan:   Principal Problem:   Symptomatic anemia Active Problems:   History of stroke   Essential hypertension   Warm autoimmune hemolytic anemia (HCC)   DVT (deep venous thrombosis) (HCC)   Fever   Symptomatic & acute blood loss anemia: etiology unclear. W/ drop in H&H over last 3 weeks from 11.7 to 8.1. Continue on PPI. Not likely GI bleed as per GI. Continue on B12 as level was low normal. H&H are stable currently   Fever: etiology unclear. Blood cxs NGTD. CXR shows pulmonary interstitial edema no pneumonia. COVID19, influenza are both neg. Hold abxs for now. Autoimmune work-up in progress as per ID. ID following and recs apprec  SVT: s/p IV lopressor x 1. Back in sinus rhythm now. Continue on tele. Echo ordered. Cardio consulted   DVT: s/p IVC filtered placed 01/06/22 by vascular surg. Continue to hold eliquis   Hx warm autoimmune hemolytic anemia: was on rituxan w/ last dose given 12/09/21. NO hemolysis as per onco. Onco recs apprec   HTN: continue on amlodipine    Hx of CVA: due to left M2 occlusion status post thrombectomy with revascularization and left A2 stenting. Continue to hold eliquis and continue on brilinta  Hypokalemia: WNL today     DVT prophylaxis:  SCDs Code Status: full  Family Communication: called pt's family no answer Disposition Plan: likely d/c home    Level of care: Med-Surg  Status is: Inpatient Remains inpatient appropriate because: still spiking fevers and had an episode of SVT, cardio consulted      Consultants:  Neuro Onco  GI  ID Cardio   Procedures:   Antimicrobials:   Subjective: Pt c/o malaise   Objective: Vitals:   01/07/22 2117 01/07/22 2254 01/08/22 0015 01/08/22 0438  BP: 106/74  (!) 143/72 (!) 153/65  Pulse: 100  82 82  Resp: 17  18 18   Temp: (!) 101.4 F (38.6 C) 98.4 F (36.9 C) 98.1 F (36.7  C) 100.2 F (37.9 C)  TempSrc:  Oral Oral Oral  SpO2: 92%  94% 93%  Weight:      Height:        Intake/Output Summary (Last 24 hours) at 01/08/2022 0543 Last data filed at 01/07/2022 1700 Gross per 24 hour  Intake 480 ml  Output --  Net 480 ml   Filed Weights   01/04/22 1830  Weight: 73.3 kg    Examination:  General exam: Appears calm but uncomfortable  Respiratory system: clear breath sounds b/l  Cardiovascular system: S1 & S2+. No rubs or gallops  Gastrointestinal system: Abd is soft, NT, ND & hypoactive bowel sounds  Central nervous system: awake and alert. Moves all extremities  Psychiatry: judgement and insight appear at baseline. Flat mood and affect     Data Reviewed: I have personally reviewed following labs and imaging studies  CBC: Recent Labs  Lab 01/04/22 1833 01/05/22 0926 01/05/22 1806 01/06/22 0558 01/07/22 0408 01/08/22 0408  WBC 9.6 9.3  --  8.2 9.3 9.9  HGB 9.2* 8.1* 7.8* 8.0* 7.7* 7.7*  HCT 27.5* 24.2* 23.0* 24.6* 23.2* 23.0*  MCV 105.8* 106.1*  --  108.4* 105.0* 104.5*  PLT 310 291  --  312 334 102   Basic Metabolic Panel: Recent Labs  Lab 01/04/22 1833 01/06/22 0558 01/07/22 0408 01/08/22 0408  NA 131* 140 136 138  K 3.8  3.5 3.2* 3.6  CL 100 106 105 104  CO2 21* 23 22 24   GLUCOSE 109* 116* 110* 95  BUN 33* 23 20 18   CREATININE 1.65* 1.20* 1.35* 1.09*  CALCIUM 8.9 8.9 8.7* 8.9   GFR: Estimated Creatinine Clearance: 45 mL/min (A) (by C-G formula based on SCr of 1.09 mg/dL (H)). Liver Function Tests: Recent Labs  Lab 01/05/22 0253  AST 26  ALT 32  ALKPHOS 49  BILITOT 1.0  PROT 6.4*  ALBUMIN 3.3*   No results for input(s): LIPASE, AMYLASE in the last 168 hours. No results for input(s): AMMONIA in the last 168 hours. Coagulation Profile: Recent Labs  Lab 01/04/22 1833  INR 1.4*   Cardiac Enzymes: No results for input(s): CKTOTAL, CKMB, CKMBINDEX, TROPONINI in the last 168 hours. BNP (last 3 results) No results for  input(s): PROBNP in the last 8760 hours. HbA1C: No results for input(s): HGBA1C in the last 72 hours. CBG: Recent Labs  Lab 01/05/22 0844  GLUCAP 126*   Lipid Profile: No results for input(s): CHOL, HDL, LDLCALC, TRIG, CHOLHDL, LDLDIRECT in the last 72 hours. Thyroid Function Tests: Recent Labs    01/06/22 0558  TSH 1.613   Anemia Panel: Recent Labs    01/06/22 0558  VITAMINB12 339  FOLATE 30.0  FERRITIN 838*  TIBC 204*  IRON 20*   Sepsis Labs: Recent Labs  Lab 01/05/22 0926 01/05/22 1307 01/07/22 0408  PROCALCITON  --   --  0.31  LATICACIDVEN 2.0* 1.5  --     Recent Results (from the past 240 hour(s))  Resp Panel by RT-PCR (Flu A&B, Covid) Nasopharyngeal Swab     Status: None   Collection Time: 01/05/22  3:53 AM   Specimen: Nasopharyngeal Swab; Nasopharyngeal(NP) swabs in vial transport medium  Result Value Ref Range Status   SARS Coronavirus 2 by RT PCR NEGATIVE NEGATIVE Final    Comment: (NOTE) SARS-CoV-2 target nucleic acids are NOT DETECTED.  The SARS-CoV-2 RNA is generally detectable in upper respiratory specimens during the acute phase of infection. The lowest concentration of SARS-CoV-2 viral copies this assay can detect is 138 copies/mL. A negative result does not preclude SARS-Cov-2 infection and should not be used as the sole basis for treatment or other patient management decisions. A negative result may occur with  improper specimen collection/handling, submission of specimen other than nasopharyngeal swab, presence of viral mutation(s) within the areas targeted by this assay, and inadequate number of viral copies(<138 copies/mL). A negative result must be combined with clinical observations, patient history, and epidemiological information. The expected result is Negative.  Fact Sheet for Patients:  EntrepreneurPulse.com.au  Fact Sheet for Healthcare Providers:  IncredibleEmployment.be  This test is no t  yet approved or cleared by the [place] FDA and  has been authorized for detection and/or diagnosis of SARS-CoV-2 by FDA under an Emergency Use Authorization (EUA). This EUA will remain  in effect (meaning this test can be used) for the duration of the COVID-19 declaration under Section 564(b)(1) of the Act, 21 U.S.C.section 360bbb-3(b)(1), unless the authorization is terminated  or revoked sooner.       Influenza A by PCR NEGATIVE NEGATIVE Final   Influenza B by PCR NEGATIVE NEGATIVE Final    Comment: (NOTE) The Xpert Xpress SARS-CoV-2/FLU/RSV plus assay is intended as an aid in the diagnosis of influenza from Nasopharyngeal swab specimens and should not be used as a sole basis for treatment. Nasal washings and aspirates are unacceptable for Xpert Xpress SARS-CoV-2/FLU/RSV testing.  Fact Sheet for Patients: EntrepreneurPulse.com.au  Fact Sheet for Healthcare Providers: IncredibleEmployment.be  This test is not yet approved or cleared by the [place] FDA and has been authorized for detection and/or diagnosis of SARS-CoV-2 by FDA under an Emergency Use Authorization (EUA). This EUA will remain in effect (meaning this test can be used) for the duration of the COVID-19 declaration under Section 564(b)(1) of the Act, 21 U.S.C. section 360bbb-3(b)(1), unless the authorization is terminated or revoked.  Performed at [facility], [address] [place], [place] [address]   CULTURE, BLOOD (ROUTINE X 2) w Reflex to ID Panel     Status: None (Preliminary result)   Collection Time: 01/05/22  9:26 AM   Specimen: BLOOD  Result Value Ref Range Status   Specimen Description BLOOD RIGHT ANTECUBITAL  Final   Special Requests   Final    Blood Culture results may not be optimal due to an excessive volume of blood received in culture bottles   Culture   Final    NO GROWTH 2 DAYS Performed at [facility], [address] [place], [place] [address]    Report Status PENDING  Incomplete  CULTURE, BLOOD (ROUTINE X 2) w Reflex to ID Panel     Status: None (Preliminary result)   Collection Time: 01/05/22  9:26 AM   Specimen: BLOOD  Result Value Ref Range Status   Specimen Description BLOOD BLOOD RIGHT HAND  Final   Special Requests   Final    BOTTLES DRAWN AEROBIC AND ANAEROBIC Blood Culture results may not be optimal due to an excessive volume of blood received in culture bottles   Culture   Final    NO GROWTH 2 DAYS Performed at [facility], [address] [place], [place] [address]    Report Status PENDING  Incomplete         Radiology Studies: PERIPHERAL VASCULAR CATHETERIZATION  Result Date: 01/06/2022 See surgical note for result.       Scheduled Meds:  amLODipine  5 mg Oral Daily   atorvastatin  80 mg Oral Daily   cyanocobalamin  1,000 mcg Intramuscular Once   docusate sodium  200 mg Oral BID   ferrous sulfate  325 mg Oral Q breakfast   folic acid  1 mg Oral Daily   magnesium gluconate  250 mg Oral QHS   metoprolol tartrate  5 mg Intravenous Once   pantoprazole (PROTONIX) IV  40 mg Intravenous Q24H   ticagrelor  90 mg Oral BID   vitamin B-12  100 mcg Oral Daily   Continuous Infusions:   LOS: 3 days    Time spent: 26 mins     Wyvonnia Dusky, MD [facility] Pager [phone]  If 7PM-7AM, please contact night-coverage 01/08/2022, 5:43 AM

## 2022-01-08 NOTE — Progress Notes (Signed)
°   01/08/22 1315  Clinical Encounter Type  Visited With Family  Visit Type Code  Referral From Nurse   Chaplain responded to a rapid response code and provided care to family who were waiting outside the room.

## 2022-01-08 NOTE — Significant Event (Signed)
Rapid Response Event Note   Reason for Call : called for pt in SVT   Initial Focused Assessment: Sitting up in bed, alert and oriented x 4, SVT on EKG...      Interventions: Dr Priscella Mann to bedside. New IV started, and 5mg  metoprolol given. Pt returned to SR... new EKG obtained. PT anxious... one time dose xanax also given.   Plan of Care: as above, RN Anemdeep to call for further assitance    Event Summary: as above  MD Notified: Dr Priscella Mann 1308 Call Time:1305 Arrival Time:1307 End Time:1325  Suzannah Bettes A, RN

## 2022-01-08 NOTE — Progress Notes (Signed)
Lucilla Lame, MD [facility]   [address], [place],  [address] Phone: [phone] Fax : [phone]   Subjective: The patient was seen after having a IVC filter placed due to her risk of bleeding.  The patient again was visibly agitated when answering questions.  She denies any further sign of any bleeding.  She is accompanied by her sister lower again who helps focus the patient's decision-making and answers. Her hemoglobin was 7.8 two days ago 8.0 yesterday and 7.7 today. She denies any abdominal pain nausea vomiting fevers or chills. Her iron studies did show a low iron with a low saturation and a low TIBC. Her ferritin was elevated at 838 with her B12 and folate being normal.   Objective: Vital signs in last 24 hours: Vitals:   01/07/22 2254 01/08/22 0015 01/08/22 0438 01/08/22 0515  BP:  (!) 143/72 (!) 153/65   Pulse:  82 82   Resp:  18 18   Temp: 98.4 F (36.9 C) 98.1 F (36.7 C) 100.2 F (37.9 C) 98.4 F (36.9 C)  TempSrc: Oral Oral Oral   SpO2:  94% 93%   Weight:      Height:       Weight change:   Intake/Output Summary (Last 24 hours) at 01/08/2022 4081 Last data filed at 01/07/2022 1700 Gross per 24 hour  Intake 480 ml  Output --  Net 480 ml     Exam: General: patient laying in bed in no apparent distress.  She is alert  Lab Results: @LABTEST2 @ Micro Results: Recent Results (from the past 240 hour(s))  Resp Panel by RT-PCR (Flu A&B, Covid) Nasopharyngeal Swab     Status: None   Collection Time: 01/05/22  3:53 AM   Specimen: Nasopharyngeal Swab; Nasopharyngeal(NP) swabs in vial transport medium  Result Value Ref Range Status   SARS Coronavirus 2 by RT PCR NEGATIVE NEGATIVE Final    Comment: (NOTE) SARS-CoV-2 target nucleic acids are NOT DETECTED.  The SARS-CoV-2 RNA is generally detectable in upper respiratory specimens during the acute phase of infection. The lowest concentration of SARS-CoV-2 viral copies this assay can detect is 138  copies/mL. A negative result does not preclude SARS-Cov-2 infection and should not be used as the sole basis for treatment or other patient management decisions. A negative result may occur with  improper specimen collection/handling, submission of specimen other than nasopharyngeal swab, presence of viral mutation(s) within the areas targeted by this assay, and inadequate number of viral copies(<138 copies/mL). A negative result must be combined with clinical observations, patient history, and epidemiological information. The expected result is Negative.  Fact Sheet for Patients:  EntrepreneurPulse.com.au  Fact Sheet for Healthcare Providers:  IncredibleEmployment.be  This test is no t yet approved or cleared by the [place] FDA and  has been authorized for detection and/or diagnosis of SARS-CoV-2 by FDA under an Emergency Use Authorization (EUA). This EUA will remain  in effect (meaning this test can be used) for the duration of the COVID-19 declaration under Section 564(b)(1) of the Act, 21 U.S.C.section 360bbb-3(b)(1), unless the authorization is terminated  or revoked sooner.       Influenza A by PCR NEGATIVE NEGATIVE Final   Influenza B by PCR NEGATIVE NEGATIVE Final    Comment: (NOTE) The Xpert Xpress SARS-CoV-2/FLU/RSV plus assay is intended as an aid in the diagnosis of influenza from Nasopharyngeal swab specimens and should not be used as a sole basis for treatment. Nasal washings and aspirates are unacceptable for Xpert  Xpress SARS-CoV-2/FLU/RSV testing.  Fact Sheet for Patients: EntrepreneurPulse.com.au  Fact Sheet for Healthcare Providers: IncredibleEmployment.be  This test is not yet approved or cleared by the [place] FDA and has been authorized for detection and/or diagnosis of SARS-CoV-2 by FDA under an Emergency Use Authorization (EUA). This EUA will remain in effect (meaning  this test can be used) for the duration of the COVID-19 declaration under Section 564(b)(1) of the Act, 21 U.S.C. section 360bbb-3(b)(1), unless the authorization is terminated or revoked.  Performed at [facility], [address] [place], [place] [address]   CULTURE, BLOOD (ROUTINE X 2) w Reflex to ID Panel     Status: None (Preliminary result)   Collection Time: 01/05/22  9:26 AM   Specimen: BLOOD  Result Value Ref Range Status   Specimen Description BLOOD RIGHT ANTECUBITAL  Final   Special Requests   Final    Blood Culture results may not be optimal due to an excessive volume of blood received in culture bottles   Culture   Final    NO GROWTH 3 DAYS Performed at [facility], [address] [place], [place] [address]    Report Status PENDING  Incomplete  CULTURE, BLOOD (ROUTINE X 2) w Reflex to ID Panel     Status: None (Preliminary result)   Collection Time: 01/05/22  9:26 AM   Specimen: BLOOD  Result Value Ref Range Status   Specimen Description BLOOD BLOOD RIGHT HAND  Final   Special Requests   Final    BOTTLES DRAWN AEROBIC AND ANAEROBIC Blood Culture results may not be optimal due to an excessive volume of blood received in culture bottles   Culture   Final    NO GROWTH 3 DAYS Performed at [facility], [address], [place], [place] [address]    Report Status PENDING  Incomplete   Studies/Results: PERIPHERAL VASCULAR CATHETERIZATION  Result Date: 01/06/2022 See surgical note for result.  Medications: I have reviewed the patient's current medications. Scheduled Meds:  amLODipine  5 mg Oral Daily   atorvastatin  80 mg Oral Daily   cyanocobalamin  1,000 mcg Intramuscular Once   docusate sodium  200 mg Oral BID   ferrous sulfate  325 mg Oral Q breakfast   folic acid  1 mg Oral Daily   magnesium gluconate  250 mg Oral QHS   metoprolol tartrate  5 mg Intravenous Once   pantoprazole (PROTONIX) IV  40 mg Intravenous Q24H   ticagrelor   90 mg Oral BID   vitamin B-12  100 mcg Oral Daily   Continuous Infusions: PRN Meds:.acetaminophen **OR** acetaminophen, ALPRAZolam, HYDROmorphone (DILAUDID) injection, ondansetron **OR** ondansetron (ZOFRAN) IV   Assessment: Principal Problem:   Symptomatic anemia Active Problems:   History of stroke   Essential hypertension   Warm autoimmune hemolytic anemia (HCC)   DVT (deep venous thrombosis) (HCC)   Fever    Plan: This patient has a stable hemoglobin without any signs of further GI bleeding.  The patient was spoken to her again about the possible need for GI workup.She is also being followed by hematology and infectious disease.  ID is following her for a fever of unknown origin.  The patient, with some encouragement by her sister-in-law, Has agreed to entertain undergoing a GI workup for iron deficiency anemia.  With the fevers of unknown origin, stable hemoglobin and recent IVC filter, timing of the procedures whether inpatient or outpatient will still need to be determined.  The patient has  been explained the plan and agrees with it.   LOS: 3 days   Lewayne Bunting 01/08/2022, 6:32 AM Pager [phone] 7am-5pm  Check AMION for 5pm -7am coverage and on weekends

## 2022-01-08 NOTE — Care Management Important Message (Signed)
Important Message  Patient Details  Name: Andrea Williamson MRN: [id number] Date of Birth: 01/15/1951   Medicare Important Message Given:  Yes     [name] 01/08/2022, 11:20 AM

## 2022-01-09 DIAGNOSIS — D5911 Warm autoimmune hemolytic anemia: Secondary | ICD-10-CM | POA: Diagnosis not present

## 2022-01-09 DIAGNOSIS — R509 Fever, unspecified: Secondary | ICD-10-CM | POA: Diagnosis not present

## 2022-01-09 DIAGNOSIS — D649 Anemia, unspecified: Secondary | ICD-10-CM | POA: Diagnosis not present

## 2022-01-09 LAB — BASIC METABOLIC PANEL
Anion gap: 13 (ref 5–15)
BUN: 22 mg/dL (ref 8–23)
CO2: 20 mmol/L — ABNORMAL LOW (ref 22–32)
Calcium: 8.8 mg/dL — ABNORMAL LOW (ref 8.9–10.3)
Chloride: 102 mmol/L (ref 98–111)
Creatinine, Ser: 1.01 mg/dL — ABNORMAL HIGH (ref 0.44–1.00)
GFR, Estimated: 60 mL/min — ABNORMAL LOW (ref >60)
Glucose, Bld: 101 mg/dL — ABNORMAL HIGH (ref 70–99)
Potassium: 3.3 mmol/L — ABNORMAL LOW (ref 3.5–5.1)
Sodium: 135 mmol/L (ref 135–145)

## 2022-01-09 LAB — CBC
HCT: 22.2 % — ABNORMAL LOW (ref 36.0–46.0)
Hemoglobin: 7.5 g/dL — ABNORMAL LOW (ref 12.0–15.0)
MCH: 34.9 pg — ABNORMAL HIGH (ref 26.0–34.0)
MCHC: 33.8 g/dL (ref 30.0–36.0)
MCV: 103.3 fL — ABNORMAL HIGH (ref 80.0–100.0)
Platelets: 392 10*3/uL (ref 150–400)
RBC: 2.15 MIL/uL — ABNORMAL LOW (ref 3.87–5.11)
RDW: 14.6 % (ref 11.5–15.5)
WBC: 9 10*3/uL (ref 4.0–10.5)
nRBC: 0.8 % — ABNORMAL HIGH (ref 0.0–0.2)

## 2022-01-09 LAB — TYPE AND SCREEN
ABO/RH(D): O POS
Antibody Screen: POSITIVE
Unit division: 0
Unit division: 0

## 2022-01-09 LAB — BPAM RBC
Blood Product Expiration Date: 202303182359
Blood Product Expiration Date: 202303192359
Unit Type and Rh: 5100
Unit Type and Rh: 5100

## 2022-01-09 LAB — ANA COMPREHENSIVE PANEL
Anti JO-1: <0.2 AI (ref 0.0–0.9)
Centromere Ab Screen: <0.2 AI (ref 0.0–0.9)
Chromatin Ab SerPl-aCnc: <0.2 AI (ref 0.0–0.9)
ENA SM Ab Ser-aCnc: <0.2 AI (ref 0.0–0.9)
Ribonucleic Protein: 0.4 AI (ref 0.0–0.9)
SSA (Ro) (ENA) Antibody, IgG: <0.2 AI (ref 0.0–0.9)
SSB (La) (ENA) Antibody, IgG: <0.2 AI (ref 0.0–0.9)
Scleroderma (Scl-70) (ENA) Antibody, IgG: <0.2 AI (ref 0.0–0.9)
ds DNA Ab: <1 IU/mL (ref 0–9)

## 2022-01-09 LAB — IGG, IGA, IGM
IgA: 129 mg/dL (ref 87–352)
IgG (Immunoglobin G), Serum: 513 mg/dL — ABNORMAL LOW (ref 586–1602)
IgM (Immunoglobulin M), Srm: 30 mg/dL (ref 26–217)

## 2022-01-09 MED ORDER — POTASSIUM CHLORIDE CRYS ER 20 MEQ PO TBCR
20.0000 meq | EXTENDED_RELEASE_TABLET | Freq: Once | ORAL | Status: AC
  Administered 2022-01-09: 20 meq via ORAL
  Filled 2022-01-09: qty 1

## 2022-01-09 MED ORDER — METOPROLOL TARTRATE 25 MG PO TABS
25.0000 mg | ORAL_TABLET | Freq: Two times a day (BID) | ORAL | Status: DC
  Administered 2022-01-09 – 2022-01-15 (×13): 25 mg via ORAL
  Filled 2022-01-09 (×13): qty 1

## 2022-01-09 MED ORDER — METOPROLOL TARTRATE 5 MG/5ML IV SOLN: 5.0000 mg | Freq: Three times a day (TID) | INTRAVENOUS | Status: DC | PRN

## 2022-01-09 NOTE — Consult Note (Signed)
[facility] Cardiology    SUBJECTIVE: Patient just wants to get better still has low-grade temp still does not feel normal does not notice palpitations or tachycardia   Vitals:   01/09/22 0058 01/09/22 0412 01/09/22 0601 01/09/22 0845  BP: (!) 144/80  138/71 (!) 157/86  Pulse: 72  76 71  Resp: 19  16 18   Temp: 97.9 F (36.6 C) (!) 100.7 F (38.2 C) 98.5 F (36.9 C) 97.9 F (36.6 C)  TempSrc: Oral  Oral Oral  SpO2: 92%  95% 96%  Weight:      Height:        No intake or output data in the 24 hours ending 01/09/22 1009    PHYSICAL EXAM  General: Well developed, well nourished, in no acute distress HEENT:  Normocephalic and atramatic Neck:  No JVD.  Lungs: Clear bilaterally to auscultation and percussion. Heart: HRRR . Normal S1 and S2 without gallops or murmurs.  Abdomen: Bowel sounds are positive, abdomen soft and non-tender  Msk:  Back normal, normal gait. Normal strength and tone for age. Extremities: No clubbing, cyanosis or edema.   Neuro: Alert and oriented X 3. Psych:  Good affect, responds appropriately   LABS: Basic Metabolic Panel: Recent Labs    01/08/22 0408 01/09/22 0633  NA 138 135  K 3.6 3.3*  CL 104 102  CO2 24 20*  GLUCOSE 95 101*  BUN 18 22  CREATININE 1.09* 1.01*  CALCIUM 8.9 8.8*   Liver Function Tests: Recent Labs    01/08/22 1412  AST 48*  ALT 42  ALKPHOS 62  BILITOT 0.8  PROT 6.9  ALBUMIN 2.8*   No results for input(s): LIPASE, AMYLASE in the last 72 hours. CBC: Recent Labs    01/08/22 0408 01/09/22 0633  WBC 9.9 9.0  HGB 7.7* 7.5*  HCT 23.0* 22.2*  MCV 104.5* 103.3*  PLT 379 392   Cardiac Enzymes: No results for input(s): CKTOTAL, CKMB, CKMBINDEX, TROPONINI in the last 72 hours. BNP: Invalid input(s): POCBNP D-Dimer: No results for input(s): DDIMER in the last 72 hours. Hemoglobin A1C: No results for input(s): HGBA1C in the last 72 hours. Fasting Lipid Panel: No results for input(s): CHOL, HDL, LDLCALC, TRIG, CHOLHDL,  LDLDIRECT in the last 72 hours. Thyroid Function Tests: No results for input(s): TSH, T4TOTAL, T3FREE, THYROIDAB in the last 72 hours.  Invalid input(s): FREET3 Anemia Panel: No results for input(s): VITAMINB12, FOLATE, FERRITIN, TIBC, IRON, RETICCTPCT in the last 72 hours.  No results found.   Echo pending  TELEMETRY: Episode of SVT rate of around 150 spontaneously converted normal sinus rhythm:  ASSESSMENT AND PLAN:  Principal Problem:   Symptomatic anemia Active Problems:   History of stroke   Essential hypertension   Warm autoimmune hemolytic anemia (HCC)   DVT (deep venous thrombosis) (HCC)   Fever History of CVA   Plan Reported episode of SVT asymptomatic nonsustained recommend conservative therapy probably related to underlying condition consider Cardizem or metoprolol therapy Agree with broad-spectrum antibiotic therapy for possible sepsis Agree with ID input for further evaluation and management 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

## 2022-01-09 NOTE — Progress Notes (Addendum)
PROGRESS NOTE    Andrea Williamson  FWY:[id number] DOB: 1950/12/21 DOA: 01/05/2022 PCP: [name], MD   Assessment & Plan:   Principal Problem:   Symptomatic anemia Active Problems:   History of stroke   Essential hypertension   Warm autoimmune hemolytic anemia (HCC)   DVT (deep venous thrombosis) (HCC)   Fever   Symptomatic & acute blood loss anemia: etiology unclear. W/ drop in H&H over last 3 weeks from 11.7 to 8.1. Continue on PPI. Not likely GI bleed as per GI. Continue on B12 as level was low normal. H&H is dropping from day prior   Fever: etiology unclear. Blood cxs NGTD. CXR shows pulmonary interstitial edema no pneumonia. COVID19, influenza are both neg. Started on flagyl, rocephin as per ID. Autoimmune work-up is in progress as per ID. ANA comprehensive panel was neg.  ID following and recs apprec  SVT: s/p IV lopressor x 1 on 01/08/22. Back in sinus rhythm. Continue on tele. IV lopressor prn. Echo is pending   DVT: s/p IVC filtered placed 01/06/22 by vascular surg. Continue to hold eliquis   Hx warm autoimmune hemolytic anemia: was on rituxan w/ last dose given 12/09/21. No hemolysis as per onco.    HTN: continue on CCB   Hx of CVA: due to left M2 occlusion status post thrombectomy with revascularization and left A2 stenting. Continue on brilinta and continue to hold eliquis   Hypokalemia: KCl given      DVT prophylaxis:  SCDs Code Status: full  Family Communication:  Disposition Plan: likely d/c home    Level of care: Med-Surg  Status is: Inpatient Remains inpatient appropriate because: still spiking fevers, placed on IV abxs      Consultants:  Neuro Onco  GI  ID Cardio   Procedures:   Antimicrobials:   Subjective: Pt c/o not feeling well   Objective: Vitals:   01/08/22 2040 01/09/22 0058 01/09/22 0412 01/09/22 0601  BP: 111/70 (!) 144/80  138/71  Pulse: 68 72  76  Resp: 18 19  16   Temp: 98 F (36.7 C) 97.9 F (36.6 C) (!) 100.7 F (38.2  C) 98.5 F (36.9 C)  TempSrc: Oral Oral  Oral  SpO2: 95% 92%  95%  Weight:      Height:       No intake or output data in the 24 hours ending 01/09/22 0759  Filed Weights   01/04/22 1830  Weight: 73.3 kg    Examination:  General exam: Appears frustrated Respiratory system: clear breath sounds b/l. No wheezes, rales  Cardiovascular system: S1/S2+. No clicks or rubs  Gastrointestinal system: Abd is soft, NT, ND & normal bowel sounds Central nervous system: Awake and oriented. Moves all extremities  Psychiatry: judgement and insight appears at baseline. Frustrated mood and affect    Data Reviewed: I have personally reviewed following labs and imaging studies  CBC: Recent Labs  Lab 01/05/22 0926 01/05/22 1806 01/06/22 0558 01/07/22 0408 01/08/22 0408 01/09/22 0633  WBC 9.3  --  8.2 9.3 9.9 9.0  HGB 8.1* 7.8* 8.0* 7.7* 7.7* 7.5*  HCT 24.2* 23.0* 24.6* 23.2* 23.0* 22.2*  MCV 106.1*  --  108.4* 105.0* 104.5* 103.3*  PLT 291  --  312 334 379 277   Basic Metabolic Panel: Recent Labs  Lab 01/04/22 1833 01/06/22 0558 01/07/22 0408 01/08/22 0408 01/09/22 0633  NA 131* 140 136 138 135  K 3.8 3.5 3.2* 3.6 3.3*  CL 100 106 105 104 102  CO2  21* 23 22 24  20*  GLUCOSE 109* 116* 110* 95 101*  BUN 33* 23 20 18 22   CREATININE 1.65* 1.20* 1.35* 1.09* 1.01*  CALCIUM 8.9 8.9 8.7* 8.9 8.8*   GFR: Estimated Creatinine Clearance: 48.6 mL/min (A) (by C-G formula based on SCr of 1.01 mg/dL (H)). Liver Function Tests: Recent Labs  Lab 01/05/22 0253 01/08/22 1412  AST 26 48*  ALT 32 42  ALKPHOS 49 62  BILITOT 1.0 0.8  PROT 6.4* 6.9  ALBUMIN 3.3* 2.8*   No results for input(s): LIPASE, AMYLASE in the last 168 hours. No results for input(s): AMMONIA in the last 168 hours. Coagulation Profile: Recent Labs  Lab 01/04/22 1833  INR 1.4*   Cardiac Enzymes: No results for input(s): CKTOTAL, CKMB, CKMBINDEX, TROPONINI in the last 168 hours. BNP (last 3 results) No results  for input(s): PROBNP in the last 8760 hours. HbA1C: No results for input(s): HGBA1C in the last 72 hours. CBG: Recent Labs  Lab 01/05/22 0844  GLUCAP 126*   Lipid Profile: No results for input(s): CHOL, HDL, LDLCALC, TRIG, CHOLHDL, LDLDIRECT in the last 72 hours. Thyroid Function Tests: No results for input(s): TSH, T4TOTAL, FREET4, T3FREE, THYROIDAB in the last 72 hours.  Anemia Panel: No results for input(s): VITAMINB12, FOLATE, FERRITIN, TIBC, IRON, RETICCTPCT in the last 72 hours.  Sepsis Labs: Recent Labs  Lab 01/05/22 0926 01/05/22 1307 01/07/22 0408 01/08/22 1412  PROCALCITON  --   --  0.31 0.20  LATICACIDVEN 2.0* 1.5  --   --     Recent Results (from the past 240 hour(s))  Resp Panel by RT-PCR (Flu A&B, Covid) Nasopharyngeal Swab     Status: None   Collection Time: 01/05/22  3:53 AM   Specimen: Nasopharyngeal Swab; Nasopharyngeal(NP) swabs in vial transport medium  Result Value Ref Range Status   SARS Coronavirus 2 by RT PCR NEGATIVE NEGATIVE Final    Comment: (NOTE) SARS-CoV-2 target nucleic acids are NOT DETECTED.  The SARS-CoV-2 RNA is generally detectable in upper respiratory specimens during the acute phase of infection. The lowest concentration of SARS-CoV-2 viral copies this assay can detect is 138 copies/mL. A negative result does not preclude SARS-Cov-2 infection and should not be used as the sole basis for treatment or other patient management decisions. A negative result may occur with  improper specimen collection/handling, submission of specimen other than nasopharyngeal swab, presence of viral mutation(s) within the areas targeted by this assay, and inadequate number of viral copies(<138 copies/mL). A negative result must be combined with clinical observations, patient history, and epidemiological information. The expected result is Negative.  Fact Sheet for Patients:  EntrepreneurPulse.com.au  Fact Sheet for Healthcare  Providers:  IncredibleEmployment.be  This test is no t yet approved or cleared by the [place] FDA and  has been authorized for detection and/or diagnosis of SARS-CoV-2 by FDA under an Emergency Use Authorization (EUA). This EUA will remain  in effect (meaning this test can be used) for the duration of the COVID-19 declaration under Section 564(b)(1) of the Act, 21 U.S.C.section 360bbb-3(b)(1), unless the authorization is terminated  or revoked sooner.       Influenza A by PCR NEGATIVE NEGATIVE Final   Influenza B by PCR NEGATIVE NEGATIVE Final    Comment: (NOTE) The Xpert Xpress SARS-CoV-2/FLU/RSV plus assay is intended as an aid in the diagnosis of influenza from Nasopharyngeal swab specimens and should not be used as a sole basis for treatment. Nasal washings and aspirates are unacceptable for Xpert  Xpress SARS-CoV-2/FLU/RSV testing.  Fact Sheet for Patients: EntrepreneurPulse.com.au  Fact Sheet for Healthcare Providers: IncredibleEmployment.be  This test is not yet approved or cleared by the [place] FDA and has been authorized for detection and/or diagnosis of SARS-CoV-2 by FDA under an Emergency Use Authorization (EUA). This EUA will remain in effect (meaning this test can be used) for the duration of the COVID-19 declaration under Section 564(b)(1) of the Act, 21 U.S.C. section 360bbb-3(b)(1), unless the authorization is terminated or revoked.  Performed at [facility], [address] [place], [place] [address]   CULTURE, BLOOD (ROUTINE X 2) w Reflex to ID Panel     Status: None (Preliminary result)   Collection Time: 01/05/22  9:26 AM   Specimen: BLOOD  Result Value Ref Range Status   Specimen Description BLOOD RIGHT ANTECUBITAL  Final   Special Requests   Final    Blood Culture results may not be optimal due to an excessive volume of blood received in culture bottles   Culture   Final     NO GROWTH 3 DAYS Performed at [facility], [address] [place], [place] [address]    Report Status PENDING  Incomplete  CULTURE, BLOOD (ROUTINE X 2) w Reflex to ID Panel     Status: None (Preliminary result)   Collection Time: 01/05/22  9:26 AM   Specimen: BLOOD  Result Value Ref Range Status   Specimen Description BLOOD BLOOD RIGHT HAND  Final   Special Requests   Final    BOTTLES DRAWN AEROBIC AND ANAEROBIC Blood Culture results may not be optimal due to an excessive volume of blood received in culture bottles   Culture   Final    NO GROWTH 3 DAYS Performed at [facility], [address] [place], [place] [address]    Report Status PENDING  Incomplete         Radiology Studies: No results found.      Scheduled Meds:  ALPRAZolam  0.25 mg Oral Once   amLODipine  5 mg Oral Daily   atorvastatin  80 mg Oral Daily   cyanocobalamin  1,000 mcg Intramuscular Once   docusate sodium  200 mg Oral BID   ferrous sulfate  325 mg Oral Q breakfast   folic acid  1 mg Oral Daily   magnesium gluconate  250 mg Oral QHS   pantoprazole (PROTONIX) IV  40 mg Intravenous Q24H   ticagrelor  90 mg Oral BID   vitamin B-12  100 mcg Oral Daily   Continuous Infusions:  cefTRIAXone (ROCEPHIN)  IV 2 g (01/08/22 1822)   metronidazole 500 mg (01/09/22 0549)     LOS: 4 days    Time spent: 30 mins     Wyvonnia Dusky, MD [facility] Pager [phone]  If 7PM-7AM, please contact night-coverage 01/09/2022, 7:59 AM

## 2022-01-09 NOTE — TOC Initial Note (Signed)
Transition of Care [facility] - Initial/Assessment Note    Patient Details  Name: Andrea Williamson MRN: [id number] Date of Birth: [birth year]  Transition [facility]) CM/SW Contact:    Eileen Stanford, LCSW Phone Number: 01/09/2022, 2:30 PM  Clinical Narrative:  CSW spoke with pt's daughter to complete High risk readmission screening. Pt lives with her spouse. Pt's family alternates on taking her to her doctor appointments. Pt still sees Dr Janese Banks. Pt gets her meds from [place] on [address]. Per Pt's daughter pt was not getting any services at home prior to admission. CSW will follow up with pt and pt's family if services are recommended after PT assessment.                 Expected Discharge Plan: Home/Self Care Barriers to Discharge: Continued Medical Work up   Patient Goals and CMS Choice Patient states their goals for this hospitalization and ongoing recovery are:: for pt to return home   Choice offered to / list presented to : Adult Children  Expected Discharge Plan and Services Expected Discharge Plan: Home/Self Care       Living arrangements for the past 2 months: Single Family Home                                      Prior Living Arrangements/Services Living arrangements for the past 2 months: Single Family Home Lives with:: Spouse Patient language and need for interpreter reviewed:: Yes Do you feel safe going back to the place where you live?: Yes      Need for Family Participation in Patient Care: Yes (Comment) Care giver support system in place?: Yes (comment)   Criminal Activity/Legal Involvement Pertinent to Current Situation/Hospitalization: No - Comment as needed  Activities of Daily Living Home Assistive Devices/Equipment: None ADL Screening (condition at time of admission) Patient's cognitive ability adequate to safely complete daily activities?: Yes Is the patient deaf or have difficulty hearing?: No Does the patient have difficulty seeing, even when  wearing glasses/contacts?: No Does the patient have difficulty concentrating, remembering, or making decisions?: Yes Patient able to express need for assistance with ADLs?: Yes Does the patient have difficulty dressing or bathing?: Yes Independently performs ADLs?: Yes (appropriate for developmental age) Does the patient have difficulty walking or climbing stairs?: Yes Weakness of Legs: Right Weakness of Arms/Hands: Both  Permission Sought/Granted Permission sought to share information with : Family Supports Permission granted to share information with : Yes, Release of Information Signed  Share Information with NAME: [name]     Permission granted to share info w Relationship: daughter     Emotional Assessment Appearance:: Appears stated age     Orientation: : Oriented to Situation, Oriented to  Time, Oriented to Self, Oriented to Place Alcohol / Substance Use: Not Applicable Psych Involvement: No (comment)  Admission diagnosis:  Dizziness [R42] Weak [R53.1] Fever [R50.9] Anticoagulated [Z79.01] AKI (acute kidney injury) ([place]) [N17.9] Anemia, unspecified type [D64.9] Patient Active Problem List   Diagnosis Date Noted   Fever 01/05/2022   History of stroke 11/13/2021   Essential hypertension 11/13/2021   DVT (deep venous thrombosis) ([place]) 11/13/2021   Adnexal mass 11/13/2021   Warm autoimmune hemolytic anemia ([place])    Symptomatic anemia 11/12/2021   HTN (hypertension) 10/19/2021   Constipation 10/19/2021   Elevated cholesterol 10/15/2021   Acute renal injury ([place]) 10/15/2021   Acute ischemic left ACA stroke (  ) 10/07/2021   Stroke (cerebrum) ([place]) 10/01/2021   PCP:  [name], MD Pharmacy:   [facility] [phone] [address], [place] [address] [place] [address] Phone: [phone] Fax: [phone]     Social Determinants of Health (SDOH) Interventions    Readmission Risk  Interventions Readmission Risk Prevention Plan 01/09/2022  Transportation Screening Complete  PCP or Specialist Appt within 3-5 Days Complete  HRI or [place] Complete  Social Work Consult for [place] Planning/Counseling Complete  Palliative Care Screening Not Applicable  Medication Review Press photographer) Complete  Some encounter information is confidential and restricted. Go to Review Flowsheets activity to see all data.  Some recent data might be hidden

## 2022-01-10 ENCOUNTER — Inpatient Hospital Stay
Admit: 2022-01-10 | Discharge: 2022-01-10 | Disposition: A | Discharge status: Home / Self Care | Payer: Medicare Other | Attending: Internal Medicine | Admitting: Internal Medicine

## 2022-01-10 DIAGNOSIS — D5911 Warm autoimmune hemolytic anemia: Secondary | ICD-10-CM | POA: Diagnosis not present

## 2022-01-10 DIAGNOSIS — R509 Fever, unspecified: Secondary | ICD-10-CM | POA: Diagnosis not present

## 2022-01-10 DIAGNOSIS — D649 Anemia, unspecified: Secondary | ICD-10-CM | POA: Diagnosis not present

## 2022-01-10 LAB — CULTURE, BLOOD (ROUTINE X 2)
Culture: NO GROWTH
Culture: NO GROWTH

## 2022-01-10 LAB — CBC
HCT: 22.8 % — ABNORMAL LOW (ref 36.0–46.0)
Hemoglobin: 7.4 g/dL — ABNORMAL LOW (ref 12.0–15.0)
MCH: 33.9 pg (ref 26.0–34.0)
MCHC: 32.5 g/dL (ref 30.0–36.0)
MCV: 104.6 fL — ABNORMAL HIGH (ref 80.0–100.0)
Platelets: 416 10*3/uL — ABNORMAL HIGH (ref 150–400)
RBC: 2.18 MIL/uL — ABNORMAL LOW (ref 3.87–5.11)
RDW: 14.6 % (ref 11.5–15.5)
WBC: 9.4 10*3/uL (ref 4.0–10.5)
nRBC: 1.3 % — ABNORMAL HIGH (ref 0.0–0.2)

## 2022-01-10 LAB — BASIC METABOLIC PANEL
Anion gap: 10 (ref 5–15)
BUN: 19 mg/dL (ref 8–23)
CO2: 22 mmol/L (ref 22–32)
Calcium: 8.8 mg/dL — ABNORMAL LOW (ref 8.9–10.3)
Chloride: 102 mmol/L (ref 98–111)
Creatinine, Ser: 1.04 mg/dL — ABNORMAL HIGH (ref 0.44–1.00)
GFR, Estimated: 58 mL/min — ABNORMAL LOW (ref >60)
Glucose, Bld: 113 mg/dL — ABNORMAL HIGH (ref 70–99)
Potassium: 3.2 mmol/L — ABNORMAL LOW (ref 3.5–5.1)
Sodium: 134 mmol/L — ABNORMAL LOW (ref 135–145)

## 2022-01-10 LAB — ECHOCARDIOGRAM COMPLETE
AR max vel: 1.78 cm2
AV Peak grad: 10.1 mmHg
Ao pk vel: 1.59 m/s
Area-P 1/2: 3.46 cm2
S' Lateral: 2.9 cm
Single Plane A4C EF: 59.4 %

## 2022-01-10 LAB — ANTIPHOSPHOLIPID SYNDROME PROF
Anticardiolipin IgG: <9 GPL U/mL (ref 0–14)
Anticardiolipin IgM: <9 MPL U/mL (ref 0–12)
DRVVT: 30.3 s (ref 0.0–47.0)
PTT Lupus Anticoagulant: 32.6 s (ref 0.0–43.5)

## 2022-01-10 MED ORDER — POTASSIUM CHLORIDE CRYS ER 20 MEQ PO TBCR
40.0000 meq | EXTENDED_RELEASE_TABLET | Freq: Once | ORAL | Status: AC
  Administered 2022-01-10: 10:00:00 40 meq via ORAL
  Filled 2022-01-10: qty 2

## 2022-01-10 NOTE — Progress Notes (Signed)
*  PRELIMINARY RESULTS* Echocardiogram 2D Echocardiogram has been performed.  Claretta Fraise 01/10/2022, 9:37 AM

## 2022-01-10 NOTE — Progress Notes (Addendum)
PROGRESS NOTE    Andrea Williamson  NIO:[id number] DOB: 1951-10-21 DOA: 01/05/2022 PCP: [name], MD   Assessment & Plan:   Principal Problem:   Symptomatic anemia Active Problems:   History of stroke   Essential hypertension   Warm autoimmune hemolytic anemia (HCC)   DVT (deep venous thrombosis) (HCC)   Fever   Symptomatic & acute blood loss anemia: etiology unclear. W/ drop in H&H over last 3 weeks from 11.7 to 8.1. Continue on PPI. Not likely GI bleed as per GI. Continue on B12 as level was low. Will transfuse if Hb < 7.0   Fever: etiology unclear. Blood cxs NGTD & repeat blood cxs NGTD. CXR shows pulmonary interstitial edema no pneumonia. COVID19, influenza are both neg. Continue on rocephin, flagyl as per ID. Autoimmune work-up is in progress as per ID. IgG is low, possibly secondary to recent rituxan treatment. ANA comprehensive panel was neg.  ID following and recs apprec  SVT: s/p IV lopressor x 1 on 01/08/22. Back in sinus rhythm. Continue on tele. IV lopressor prn. Echo is pending.   DVT: s/p IVC filter placed on 01/06/22 by vasc surg. Continue to hold eliquis   Hx of warm autoimmune hemolytic anemia: was on rituxan w/ last dose given 12/09/21. No hemolysis as per onco   HTN: continue on amlodipine    Hx of CVA: due to left M2 occlusion status post thrombectomy with revascularization and left A2 stenting. Continue on brilinta & hold eliquis   Hypokalemia: potassium given. Mg level ordered tomorrow AM      DVT prophylaxis:  SCDs Code Status: full  Family Communication:  Disposition Plan: likely d/c home    Level of care: Med-Surg  Status is: Inpatient Remains inpatient appropriate because: still spiking fevers, placed on IV abxs      Consultants:  Neuro Onco  GI  ID Cardio   Procedures:   Antimicrobials:   Subjective: Pt c/o malaise   Objective: Vitals:   01/09/22 1820 01/09/22 2012 01/09/22 2347 01/10/22 0416  BP:  120/85 135/68 (!) 145/75   Pulse:  76 73 84  Resp: 18   20  Temp: 98.6 F (37 C) 98.1 F (36.7 C) 98.6 F (37 C) 99.3 F (37.4 C)  TempSrc: Oral Oral Oral Oral  SpO2:  99% 96% 97%  Weight:      Height:        Intake/Output Summary (Last 24 hours) at 01/10/2022 0748 Last data filed at 01/10/2022 8182 Gross per 24 hour  Intake 1048.2 ml  Output --  Net 1048.2 ml    Filed Weights   01/04/22 1830  Weight: 73.3 kg    Examination:  General exam: Appears calm & comfortable  Respiratory system: clear breath sounds b/l  Cardiovascular system: S1 & S2+. No rubs or clicks  Gastrointestinal system: Abd is soft, NT, ND & normal bowel sounds  Central nervous system: Awake and oriented. Moves all extremities   Psychiatry: judgement and insight appears at baseline. Flat mood and affect     Data Reviewed: I have personally reviewed following labs and imaging studies  CBC: Recent Labs  Lab 01/06/22 0558 01/07/22 0408 01/08/22 0408 01/09/22 0633 01/10/22 0358  WBC 8.2 9.3 9.9 9.0 9.4  HGB 8.0* 7.7* 7.7* 7.5* 7.4*  HCT 24.6* 23.2* 23.0* 22.2* 22.8*  MCV 108.4* 105.0* 104.5* 103.3* 104.6*  PLT 312 334 379 392 993*   Basic Metabolic Panel: Recent Labs  Lab 01/06/22 0558 01/07/22 0408 01/08/22 0408  01/09/22 0633 01/10/22 0358  NA 140 136 138 135 134*  K 3.5 3.2* 3.6 3.3* 3.2*  CL 106 105 104 102 102  CO2 23 22 24  20* 22  GLUCOSE 116* 110* 95 101* 113*  BUN 23 20 18 22 19   CREATININE 1.20* 1.35* 1.09* 1.01* 1.04*  CALCIUM 8.9 8.7* 8.9 8.8* 8.8*   GFR: Estimated Creatinine Clearance: 47.2 mL/min (A) (by C-G formula based on SCr of 1.04 mg/dL (H)). Liver Function Tests: Recent Labs  Lab 01/05/22 0253 01/08/22 1412  AST 26 48*  ALT 32 42  ALKPHOS 49 62  BILITOT 1.0 0.8  PROT 6.4* 6.9  ALBUMIN 3.3* 2.8*   No results for input(s): LIPASE, AMYLASE in the last 168 hours. No results for input(s): AMMONIA in the last 168 hours. Coagulation Profile: Recent Labs  Lab 01/04/22 1833  INR  1.4*   Cardiac Enzymes: No results for input(s): CKTOTAL, CKMB, CKMBINDEX, TROPONINI in the last 168 hours. BNP (last 3 results) No results for input(s): PROBNP in the last 8760 hours. HbA1C: No results for input(s): HGBA1C in the last 72 hours. CBG: Recent Labs  Lab 01/05/22 0844  GLUCAP 126*   Lipid Profile: No results for input(s): CHOL, HDL, LDLCALC, TRIG, CHOLHDL, LDLDIRECT in the last 72 hours. Thyroid Function Tests: No results for input(s): TSH, T4TOTAL, FREET4, T3FREE, THYROIDAB in the last 72 hours.  Anemia Panel: No results for input(s): VITAMINB12, FOLATE, FERRITIN, TIBC, IRON, RETICCTPCT in the last 72 hours.  Sepsis Labs: Recent Labs  Lab 01/05/22 0926 01/05/22 1307 01/07/22 0408 01/08/22 1412  PROCALCITON  --   --  0.31 0.20  LATICACIDVEN 2.0* 1.5  --   --     Recent Results (from the past 240 hour(s))  Resp Panel by RT-PCR (Flu A&B, Covid) Nasopharyngeal Swab     Status: None   Collection Time: 01/05/22  3:53 AM   Specimen: Nasopharyngeal Swab; Nasopharyngeal(NP) swabs in vial transport medium  Result Value Ref Range Status   SARS Coronavirus 2 by RT PCR NEGATIVE NEGATIVE Final    Comment: (NOTE) SARS-CoV-2 target nucleic acids are NOT DETECTED.  The SARS-CoV-2 RNA is generally detectable in upper respiratory specimens during the acute phase of infection. The lowest concentration of SARS-CoV-2 viral copies this assay can detect is 138 copies/mL. A negative result does not preclude SARS-Cov-2 infection and should not be used as the sole basis for treatment or other patient management decisions. A negative result may occur with  improper specimen collection/handling, submission of specimen other than nasopharyngeal swab, presence of viral mutation(s) within the areas targeted by this assay, and inadequate number of viral copies(<138 copies/mL). A negative result must be combined with clinical observations, patient history, and  epidemiological information. The expected result is Negative.  Fact Sheet for Patients:  EntrepreneurPulse.com.au  Fact Sheet for Healthcare Providers:  IncredibleEmployment.be  This test is no t yet approved or cleared by the [place] FDA and  has been authorized for detection and/or diagnosis of SARS-CoV-2 by FDA under an Emergency Use Authorization (EUA). This EUA will remain  in effect (meaning this test can be used) for the duration of the COVID-19 declaration under Section 564(b)(1) of the Act, 21 U.S.C.section 360bbb-3(b)(1), unless the authorization is terminated  or revoked sooner.       Influenza A by PCR NEGATIVE NEGATIVE Final   Influenza B by PCR NEGATIVE NEGATIVE Final    Comment: (NOTE) The Xpert Xpress SARS-CoV-2/FLU/RSV plus assay is intended as an aid in  the diagnosis of influenza from Nasopharyngeal swab specimens and should not be used as a sole basis for treatment. Nasal washings and aspirates are unacceptable for Xpert Xpress SARS-CoV-2/FLU/RSV testing.  Fact Sheet for Patients: EntrepreneurPulse.com.au  Fact Sheet for Healthcare Providers: IncredibleEmployment.be  This test is not yet approved or cleared by the [place] FDA and has been authorized for detection and/or diagnosis of SARS-CoV-2 by FDA under an Emergency Use Authorization (EUA). This EUA will remain in effect (meaning this test can be used) for the duration of the COVID-19 declaration under Section 564(b)(1) of the Act, 21 U.S.C. section 360bbb-3(b)(1), unless the authorization is terminated or revoked.  Performed at [facility], [address] [place], [place] [address]   CULTURE, BLOOD (ROUTINE X 2) w Reflex to ID Panel     Status: None   Collection Time: 01/05/22  9:26 AM   Specimen: BLOOD  Result Value Ref Range Status   Specimen Description BLOOD RIGHT ANTECUBITAL  Final   Special  Requests   Final    Blood Culture results may not be optimal due to an excessive volume of blood received in culture bottles   Culture   Final    NO GROWTH 5 DAYS Performed at [facility], [address] [place], [place] [address]    Report Status 01/10/2022 FINAL  Final  CULTURE, BLOOD (ROUTINE X 2) w Reflex to ID Panel     Status: None   Collection Time: 01/05/22  9:26 AM   Specimen: BLOOD  Result Value Ref Range Status   Specimen Description BLOOD BLOOD RIGHT HAND  Final   Special Requests   Final    BOTTLES DRAWN AEROBIC AND ANAEROBIC Blood Culture results may not be optimal due to an excessive volume of blood received in culture bottles   Culture   Final    NO GROWTH 5 DAYS Performed at [facility], [address] [place], [place] [address]    Report Status 01/10/2022 FINAL  Final  CULTURE, BLOOD (ROUTINE X 2) w Reflex to ID Panel     Status: None (Preliminary result)   Collection Time: 01/08/22  2:12 PM   Specimen: BLOOD  Result Value Ref Range Status   Specimen Description BLOOD RIGHT ANTECUBITAL  Final   Special Requests   Final    BOTTLES DRAWN AEROBIC AND ANAEROBIC Blood Culture adequate volume   Culture   Final    NO GROWTH 2 DAYS Performed at [facility], [address] [place], [place] [address]    Report Status PENDING  Incomplete  CULTURE, BLOOD (ROUTINE X 2) w Reflex to ID Panel     Status: None (Preliminary result)   Collection Time: 01/08/22  2:14 PM   Specimen: BLOOD  Result Value Ref Range Status   Specimen Description BLOOD BLOOD RIGHT HAND  Final   Special Requests   Final    BOTTLES DRAWN AEROBIC AND ANAEROBIC Blood Culture results may not be optimal due to an inadequate volume of blood received in culture bottles   Culture   Final    NO GROWTH 2 DAYS Performed at [facility], [address] [place], [place] [address]    Report Status PENDING  Incomplete         Radiology Studies: No results  found.      Scheduled Meds:  ALPRAZolam  0.25 mg Oral Once   amLODipine  5 mg Oral Daily   atorvastatin  80 mg Oral Daily  cyanocobalamin  1,000 mcg Intramuscular Once   docusate sodium  200 mg Oral BID   ferrous sulfate  325 mg Oral Q breakfast   folic acid  1 mg Oral Daily   magnesium gluconate  250 mg Oral QHS   metoprolol tartrate  25 mg Oral BID   pantoprazole (PROTONIX) IV  40 mg Intravenous Q24H   potassium chloride  40 mEq Oral Once   ticagrelor  90 mg Oral BID   vitamin B-12  100 mcg Oral Daily   Continuous Infusions:  cefTRIAXone (ROCEPHIN)  IV 2 g (01/09/22 1034)   metronidazole 500 mg (01/10/22 0531)     LOS: 5 days    Time spent: 15 mins     Wyvonnia Dusky, MD [facility] Pager [phone]  If 7PM-7AM, please contact night-coverage 01/10/2022, 7:48 AM

## 2022-01-11 ENCOUNTER — Ambulatory Visit: Payer: Medicare Other | Admitting: Medical

## 2022-01-11 LAB — CBC
HCT: 24.6 % — ABNORMAL LOW (ref 36.0–46.0)
Hemoglobin: 7.9 g/dL — ABNORMAL LOW (ref 12.0–15.0)
MCH: 33.6 pg (ref 26.0–34.0)
MCHC: 32.1 g/dL (ref 30.0–36.0)
MCV: 104.7 fL — ABNORMAL HIGH (ref 80.0–100.0)
Platelets: 453 10*3/uL — ABNORMAL HIGH (ref 150–400)
RBC: 2.35 MIL/uL — ABNORMAL LOW (ref 3.87–5.11)
RDW: 14.8 % (ref 11.5–15.5)
WBC: 9.9 10*3/uL (ref 4.0–10.5)
nRBC: 1.2 % — ABNORMAL HIGH (ref 0.0–0.2)

## 2022-01-11 LAB — ANCA TITERS
Atypical P-ANCA titer: <1:20 {titer}
C-ANCA: <1:20 {titer}
P-ANCA: <1:20 {titer}

## 2022-01-11 LAB — CMV DNA, QUANTITATIVE, PCR
CMV DNA Quant: NEGATIVE IU/mL
Log10 CMV Qn DNA Pl: UNDETERMINED log10 IU/mL

## 2022-01-11 LAB — BASIC METABOLIC PANEL
Anion gap: 12 (ref 5–15)
BUN: 20 mg/dL (ref 8–23)
CO2: 23 mmol/L (ref 22–32)
Calcium: 9.1 mg/dL (ref 8.9–10.3)
Chloride: 102 mmol/L (ref 98–111)
Creatinine, Ser: 1.01 mg/dL — ABNORMAL HIGH (ref 0.44–1.00)
GFR, Estimated: 60 mL/min — ABNORMAL LOW (ref >60)
Glucose, Bld: 66 mg/dL — ABNORMAL LOW (ref 70–99)
Potassium: 3.5 mmol/L (ref 3.5–5.1)
Sodium: 137 mmol/L (ref 135–145)

## 2022-01-11 LAB — MAGNESIUM: Magnesium: 1.8 mg/dL (ref 1.7–2.4)

## 2022-01-11 MED ORDER — PANTOPRAZOLE SODIUM 40 MG PO TBEC
40.0000 mg | DELAYED_RELEASE_TABLET | Freq: Every day | ORAL | Status: DC
Start: 2022-01-11 — End: 2022-01-15
  Administered 2022-01-11 – 2022-01-15 (×5): 40 mg via ORAL
  Filled 2022-01-11 (×5): qty 1

## 2022-01-11 MED ORDER — METRONIDAZOLE 500 MG PO TABS
500.0000 mg | ORAL_TABLET | Freq: Two times a day (BID) | ORAL | Status: DC
  Administered 2022-01-11 – 2022-01-13 (×4): 500 mg via ORAL
  Filled 2022-01-11 (×4): qty 1

## 2022-01-11 MED ORDER — CYANOCOBALAMIN 1000 MCG/ML IJ SOLN
1000.0000 ug | Freq: Once | INTRAMUSCULAR | Status: AC
  Administered 2022-01-11: 1000 ug via INTRAMUSCULAR
  Filled 2022-01-11: qty 1

## 2022-01-11 NOTE — Progress Notes (Incomplete)
[facility]    SUBJECTIVE: ***   Vitals:   01/10/22 1116 01/10/22 1606 01/10/22 2119 01/10/22 2343  BP: (!) 154/79 (!) 143/74 (!) 149/75   Pulse: 66 83 81   Resp: 19 19 18    Temp: 98.3 F (36.8 C) 98.2 F (36.8 C) (!) 100.4 F (38 C) 98 F (36.7 C)  TempSrc:   Oral Oral  SpO2: 98% 95% 94%   Weight:      Height:         Intake/Output Summary (Last 24 hours) at 01/11/2022 0002 Last data filed at 01/10/2022 0900 Gross per 24 hour  Intake 608.2 ml  Output --  Net 608.2 ml      PHYSICAL EXAM  General: Well developed, well nourished, in no acute distress HEENT:  Normocephalic and atramatic Neck:  No JVD.  Lungs: Clear bilaterally to auscultation and percussion. Heart: HRRR . Normal S1 and S2 without gallops or murmurs.  Abdomen: Bowel sounds are positive, abdomen soft and non-tender  Msk:  Back normal, normal gait. Normal strength and tone for age. Extremities: No clubbing, cyanosis or edema.   Neuro: Alert and oriented X 3. Psych:  Good affect, responds appropriately   LABS: Basic Metabolic Panel: Recent Labs    01/09/22 0633 01/10/22 0358  NA 135 134*  K 3.3* 3.2*  CL 102 102  CO2 20* 22  GLUCOSE 101* 113*  BUN 22 19  CREATININE 1.01* 1.04*  CALCIUM 8.8* 8.8*   Liver Function Tests: Recent Labs    01/08/22 1412  AST 48*  ALT 42  ALKPHOS 62  BILITOT 0.8  PROT 6.9  ALBUMIN 2.8*   No results for input(s): LIPASE, AMYLASE in the last 72 hours. CBC: Recent Labs    01/09/22 0633 01/10/22 0358  WBC 9.0 9.4  HGB 7.5* 7.4*  HCT 22.2* 22.8*  MCV 103.3* 104.6*  PLT 392 416*   Cardiac Enzymes: No results for input(s): CKTOTAL, CKMB, CKMBINDEX, TROPONINI in the last 72 hours. BNP: Invalid input(s): POCBNP D-Dimer: No results for input(s): DDIMER in the last 72 hours. Hemoglobin A1C: No results for input(s): HGBA1C in the last 72 hours. Fasting Lipid Panel: No results for input(s): CHOL, HDL, LDLCALC, TRIG, CHOLHDL, LDLDIRECT in the last 72  hours. Thyroid Function Tests: No results for input(s): TSH, T4TOTAL, T3FREE, THYROIDAB in the last 72 hours.  Invalid input(s): FREET3 Anemia Panel: No results for input(s): VITAMINB12, FOLATE, FERRITIN, TIBC, IRON, RETICCTPCT in the last 72 hours.  ECHOCARDIOGRAM COMPLETE  Result Date: 01/10/2022    ECHOCARDIOGRAM REPORT   Patient Name:   Andrea Williamson Date of Exam: 01/10/2022 Medical Rec #:  [id number]  Height:       62.0 in Accession #:    [id number] Weight:       161.6 lb Date of Birth:  1951-10-26  BSA:          1.746 m Patient Age:    71 years   BP:           145/75 mmHg Patient Gender: F          HR:           66 bpm. Exam Location:  [facility] Procedure: 2D Echo Indications:     Fever R50.9  History:         Patient has prior history of Echocardiogram examinations, most                  recent 10/02/2021.  Sonographer:  Kathlen Brunswick RDCS Referring Phys:  [id number] Wyvonnia Dusky Diagnosing Phys: Yolonda Kida MD IMPRESSIONS  1. Left ventricular ejection fraction, by estimation, is 65 to 70%. The left ventricle has normal function. The left ventricle has no regional wall motion abnormalities. Left ventricular diastolic parameters are consistent with Grade I diastolic dysfunction (impaired relaxation).  2. Right ventricular systolic function is normal. The right ventricular size is normal.  3. The mitral valve is grossly normal. No evidence of mitral valve regurgitation.  4. The aortic valve is normal in structure. Aortic valve regurgitation is not visualized. FINDINGS  Left Ventricle: Left ventricular ejection fraction, by estimation, is 65 to 70%. The left ventricle has normal function. The left ventricle has no regional wall motion abnormalities. The left ventricular internal cavity size was normal in size. There is  no left ventricular hypertrophy. Left ventricular diastolic parameters are consistent with Grade I diastolic dysfunction (impaired relaxation). Right Ventricle: The right  ventricular size is normal. No increase in right ventricular wall thickness. Right ventricular systolic function is normal. Left Atrium: Left atrial size was normal in size. Right Atrium: Right atrial size was normal in size. Pericardium: There is no evidence of pericardial effusion. Mitral Valve: The mitral valve is grossly normal. No evidence of mitral valve regurgitation. Tricuspid Valve: The tricuspid valve is normal in structure. Tricuspid valve regurgitation is not demonstrated. Aortic Valve: The aortic valve is normal in structure. Aortic valve regurgitation is not visualized. Aortic valve peak gradient measures 10.1 mmHg. Pulmonic Valve: The pulmonic valve was normal in structure. Pulmonic valve regurgitation is not visualized. Aorta: The ascending aorta was not well visualized. IAS/Shunts: No atrial level shunt detected by color flow Doppler.  LEFT VENTRICLE PLAX 2D LVIDd:         4.60 cm     Diastology LVIDs:         2.90 cm     LV e' medial:    5.22 cm/s LV PW:         1.40 cm     LV E/e' medial:  12.5 LV IVS:        1.30 cm     LV e' lateral:   3.81 cm/s LVOT diam:     1.80 cm     LV E/e' lateral: 17.1 LV SV:         58 LV SV Index:   33 LVOT Area:     2.54 cm  LV Volumes (MOD) LV vol d, MOD A4C: 64.6 ml LV vol s, MOD A4C: 26.2 ml LV SV MOD A4C:     64.6 ml RIGHT VENTRICLE RV Basal diam:  3.30 cm RV S prime:     19.90 cm/s TAPSE (M-mode): 2.2 cm LEFT ATRIUM             Index        RIGHT ATRIUM           Index LA diam:        2.40 cm 1.37 cm/m   RA Area:     17.40 cm LA Vol (A2C):   33.9 ml 19.41 ml/m  RA Volume:   52.30 ml  29.95 ml/m LA Vol (A4C):   42.1 ml 24.11 ml/m LA Biplane Vol: 40.5 ml 23.19 ml/m  AORTIC VALVE                 PULMONIC VALVE AV Area (Vmax): 1.78 cm     PV Vmax:       1.14  m/s AV Vmax:        159.00 cm/s  PV Peak grad:  5.2 mmHg AV Peak Grad:   10.1 mmHg LVOT Vmax:      111.00 cm/s LVOT Vmean:     70.000 cm/s LVOT VTI:       0.229 m  AORTA Ao Root diam: 2.90 cm Ao Asc diam:   3.20 cm MITRAL VALVE               TRICUSPID VALVE MV Area (PHT): 3.46 cm    TV Peak grad:   22.8 mmHg MV Decel Time: 219 msec    TV Vmax:        2.39 m/s MV E velocity: 65.10 cm/s MV A velocity: 81.80 cm/s  SHUNTS MV E/A ratio:  0.80        Systemic VTI:  0.23 m                            Systemic Diam: 1.80 cm Roger Kettles Prince Rome MD Electronically signed by Yolonda Kida MD Signature Date/Time: 01/10/2022/2:25:19 PM    Final      Echo ***  TELEMETRY: ***:  ASSESSMENT AND PLAN:  Principal Problem:   Symptomatic anemia Active Problems:   History of stroke   Essential hypertension   Warm autoimmune hemolytic anemia (HCC)   DVT (deep venous thrombosis) (HCC)   Fever    1. ***   Yolonda Kida, MD 01/11/2022 12:02 AM

## 2022-01-11 NOTE — Progress Notes (Signed)
Brief ID note:  Patient was started on ceftriaxone and metronidazole 2/24 in the setting of fever and SVT.  Repeat blood cultures were obtained.  These are no growth to date.  Procalcitonin was also obtained and was negative.  Further labs are pending including CMV PCR, parvovirus PCR.  Autoimmune labs that are back so far have been negative. She otherwise has no new imaging at this time.  Labs today show no evidence of leukocytosis.  Continues to have fevers although the trend may be improving.  Will continue ceftriaxone and flagyl for now.    Raynelle [facility] for Infectious Disease [facility] 01/11/2022, 10:55 AM

## 2022-01-11 NOTE — Plan of Care (Signed)
  Problem: Clinical Measurements: Goal: Ability to maintain clinical measurements within normal limits will improve Outcome: Progressing   Problem: Clinical Measurements: Goal: Will remain free from infection Outcome: Progressing   Problem: Clinical Measurements: Goal: Diagnostic test results will improve Outcome: Progressing   

## 2022-01-11 NOTE — Progress Notes (Signed)
PHARMACIST - PHYSICIAN COMMUNICATION  CONCERNING: Antibiotic IV to Oral Route Change Policy  RECOMMENDATION: This patient is receiving metronidazole by the intravenous route.  Based on criteria approved by the Pharmacy and Therapeutics Committee, the antibiotic(s) is/are being converted to the equivalent oral dose form(s).   DESCRIPTION: These criteria include: Patient being treated for a respiratory tract infection, urinary tract infection, cellulitis or clostridium difficile associated diarrhea if on metronidazole The patient is not neutropenic and does not exhibit a GI malabsorption state The patient is eating (either orally or via tube) and/or has been taking other orally administered medications for a least 24 hours The patient is improving clinically and has a Tmax < 100.5  If you have questions about this conversion, please contact the [facility]  01/11/22

## 2022-01-11 NOTE — Progress Notes (Signed)
PHARMACIST - PHYSICIAN COMMUNICATION  CONCERNING: IV to Oral Route Change Policy  RECOMMENDATION: This patient is receiving pantoprazole by the intravenous route.  Based on criteria approved by the Pharmacy and Therapeutics Committee, the intravenous medication(s) is/are being converted to the equivalent oral dose form(s).   DESCRIPTION: These criteria include: The patient is eating (either orally or via tube) and/or has been taking other orally administered medications for a least 24 hours The patient has no evidence of active gastrointestinal bleeding or impaired GI absorption (gastrectomy, short bowel, patient on TNA or NPO).  If you have questions about this conversion, please contact the [facility] 01/11/2022 7:48 AM

## 2022-01-11 NOTE — Progress Notes (Addendum)
PROGRESS NOTE    Andrea Williamson  VFI:[id number] DOB: [birth year] DOA: 01/05/2022 PCP: [name], MD   Assessment & Plan:   Principal Problem:   Symptomatic anemia Active Problems:   History of stroke   Essential hypertension   Warm autoimmune hemolytic anemia (HCC)   DVT (deep venous thrombosis) (HCC)   Fever   Symptomatic & acute blood loss anemia: etiology unclear. W/ drop in H&H over last 3 weeks from 11.7 to 8.1. Continue on PPI. Not likely GI bleed as per GI. Continue on B12 as level was low. Will transfuse if Hb < 7.0   Fever:  etiology unclear, still spiking fevers daily. Blood cxs NGTD & repeat blood cxs NGTD. CXR shows pulmonary interstitial edema no pneumonia. COVID19, influenza are both neg. Continue on rocephin, flagyl as per ID. Autoimmune work-up is in progress as per ID. IgG is low, possibly secondary to recent rituxan treatment. ANA comprehensive panel & antiphospholipid syn panel was neg.  ID following and recs apprec  SVT: s/p IV lopressor x 1 on 01/08/22. Back in sinus rhythm. Continue on tele. IV lopressor prn. Echo shows EF 41-66%, grade I diastolic dysfunction & no atrial level shunt detected   DVT: s/p IVC filter placed on 01/06/22 by vasc surg. Continue to hold eliquis  Hx of warm autoimmune hemolytic anemia: was on rituxan w/ last dose give 12/09/21. 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 Hold eliquis & continue on brilinta   Hypokalemia: WNL today. Mg is WNL      DVT prophylaxis:  SCDs Code Status: full  Family Communication: discussed pt's care w/ pt's family at bedside and answered their questions  Disposition Plan: likely d/c home    Level of care: Med-Surg  Status is: Inpatient Remains inpatient appropriate because: still spiking fevers, placed on IV abxs      Consultants:  Neuro Onco  GI  ID Cardio   Procedures:    Antimicrobials:   Subjective: Pt c/o fatigue   Objective: Vitals:   01/11/22 0528 01/11/22 0749 01/11/22 0810 01/11/22 0917  BP: (!) 151/84 (!) 152/74  134/83  Pulse: 82 78    Resp: 18 (!) 28 19 18   Temp: 100 F (37.8 C) 99.7 F (37.6 C)    TempSrc: Oral Oral    SpO2: 93% 92%    Weight:      Height:        Intake/Output Summary (Last 24 hours) at 01/11/2022 1446 Last data filed at 01/11/2022 1412 Gross per 24 hour  Intake 400 ml  Output --  Net 400 ml    Filed Weights   01/04/22 1830  Weight: 73.3 kg    Examination:  General exam: Appears agitated Respiratory system: clear breath sounds b/l  Cardiovascular system: S1/S2+. No rubs or clicks  Gastrointestinal system: abd is soft, NT, ND & normal bowel sounds Central nervous system: awake and oriented. Moves all extremities  Psychiatry: judgement and insight appears at baseline. Appears agitated and frustrated     Data Reviewed: I have personally reviewed following labs and imaging studies  CBC: Recent Labs  Lab 01/07/22 0408 01/08/22 0408 01/09/22 0633 01/10/22 0358 01/11/22 0411  WBC 9.3 9.9 9.0 9.4 9.9  HGB 7.7* 7.7* 7.5* 7.4* 7.9*  HCT 23.2* 23.0* 22.2* 22.8* 24.6*  MCV 105.0* 104.5* 103.3* 104.6* 104.7*  PLT 334 379  392 416* 756*   Basic Metabolic Panel: Recent Labs  Lab 01/07/22 0408 01/08/22 0408 01/09/22 0633 01/10/22 0358 01/11/22 0411  NA 136 138 135 134* 137  K 3.2* 3.6 3.3* 3.2* 3.5  CL 105 104 102 102 102  CO2 22 24 20* 22 23  GLUCOSE 110* 95 101* 113* 66*  BUN 20 18 22 19 20   CREATININE 1.35* 1.09* 1.01* 1.04* 1.01*  CALCIUM 8.7* 8.9 8.8* 8.8* 9.1  MG  --   --   --   --  1.8   GFR: Estimated Creatinine Clearance: 48.6 mL/min (A) (by C-G formula based on SCr of 1.01 mg/dL (H)). Liver Function Tests: Recent Labs  Lab 01/05/22 0253 01/08/22 1412  AST 26 48*  ALT 32 42  ALKPHOS 49 62  BILITOT 1.0 0.8  PROT 6.4* 6.9  ALBUMIN 3.3* 2.8*   No results for input(s):  LIPASE, AMYLASE in the last 168 hours. No results for input(s): AMMONIA in the last 168 hours. Coagulation Profile: Recent Labs  Lab 01/04/22 1833  INR 1.4*   Cardiac Enzymes: No results for input(s): CKTOTAL, CKMB, CKMBINDEX, TROPONINI in the last 168 hours. BNP (last 3 results) No results for input(s): PROBNP in the last 8760 hours. HbA1C: No results for input(s): HGBA1C in the last 72 hours. CBG: Recent Labs  Lab 01/05/22 0844  GLUCAP 126*   Lipid Profile: No results for input(s): CHOL, HDL, LDLCALC, TRIG, CHOLHDL, LDLDIRECT in the last 72 hours. Thyroid Function Tests: No results for input(s): TSH, T4TOTAL, FREET4, T3FREE, THYROIDAB in the last 72 hours.  Anemia Panel: No results for input(s): VITAMINB12, FOLATE, FERRITIN, TIBC, IRON, RETICCTPCT in the last 72 hours.  Sepsis Labs: Recent Labs  Lab 01/05/22 0926 01/05/22 1307 01/07/22 0408 01/08/22 1412  PROCALCITON  --   --  0.31 0.20  LATICACIDVEN 2.0* 1.5  --   --     Recent Results (from the past 240 hour(s))  Resp Panel by RT-PCR (Flu A&B, Covid) Nasopharyngeal Swab     Status: None   Collection Time: 01/05/22  3:53 AM   Specimen: Nasopharyngeal Swab; Nasopharyngeal(NP) swabs in vial transport medium  Result Value Ref Range Status   SARS Coronavirus 2 by RT PCR NEGATIVE NEGATIVE Final    Comment: (NOTE) SARS-CoV-2 target nucleic acids are NOT DETECTED.  The SARS-CoV-2 RNA is generally detectable in upper respiratory specimens during the acute phase of infection. The lowest concentration of SARS-CoV-2 viral copies this assay can detect is 138 copies/mL. A negative result does not preclude SARS-Cov-2 infection and should not be used as the sole basis for treatment or other patient management decisions. A negative result may occur with  improper specimen collection/handling, submission of specimen other than nasopharyngeal swab, presence of viral mutation(s) within the areas targeted by this assay, and  inadequate number of viral copies(<138 copies/mL). A negative result must be combined with clinical observations, patient history, and epidemiological information. The expected result is Negative.  Fact Sheet for Patients:  EntrepreneurPulse.com.au  Fact Sheet for Healthcare Providers:  IncredibleEmployment.be  This test is no t yet approved or cleared by the [place] FDA and  has been authorized for detection and/or diagnosis of SARS-CoV-2 by FDA under an Emergency Use Authorization (EUA). This EUA will remain  in effect (meaning this test can be used) for the duration of the COVID-19 declaration under Section 564(b)(1) of the Act, 21 U.S.C.section 360bbb-3(b)(1), unless the authorization is terminated  or revoked sooner.  Influenza A by PCR NEGATIVE NEGATIVE Final   Influenza B by PCR NEGATIVE NEGATIVE Final    Comment: (NOTE) The Xpert Xpress SARS-CoV-2/FLU/RSV plus assay is intended as an aid in the diagnosis of influenza from Nasopharyngeal swab specimens and should not be used as a sole basis for treatment. Nasal washings and aspirates are unacceptable for Xpert Xpress SARS-CoV-2/FLU/RSV testing.  Fact Sheet for Patients: EntrepreneurPulse.com.au  Fact Sheet for Healthcare Providers: IncredibleEmployment.be  This test is not yet approved or cleared by the [place] FDA and has been authorized for detection and/or diagnosis of SARS-CoV-2 by FDA under an Emergency Use Authorization (EUA). This EUA will remain in effect (meaning this test can be used) for the duration of the COVID-19 declaration under Section 564(b)(1) of the Act, 21 U.S.C. section 360bbb-3(b)(1), unless the authorization is terminated or revoked.  Performed at [facility], [address] [place], [place] [address]   CULTURE, BLOOD (ROUTINE X 2) w Reflex to ID Panel     Status: None   Collection Time:  01/05/22  9:26 AM   Specimen: BLOOD  Result Value Ref Range Status   Specimen Description BLOOD RIGHT ANTECUBITAL  Final   Special Requests   Final    Blood Culture results may not be optimal due to an excessive volume of blood received in culture bottles   Culture   Final    NO GROWTH 5 DAYS Performed at [facility], [address] [place], [place] [address]    Report Status 01/10/2022 FINAL  Final  CULTURE, BLOOD (ROUTINE X 2) w Reflex to ID Panel     Status: None   Collection Time: 01/05/22  9:26 AM   Specimen: BLOOD  Result Value Ref Range Status   Specimen Description BLOOD BLOOD RIGHT HAND  Final   Special Requests   Final    BOTTLES DRAWN AEROBIC AND ANAEROBIC Blood Culture results may not be optimal due to an excessive volume of blood received in culture bottles   Culture   Final    NO GROWTH 5 DAYS Performed at [facility], [address] [place], [place] [address]    Report Status 01/10/2022 FINAL  Final  CULTURE, BLOOD (ROUTINE X 2) w Reflex to ID Panel     Status: None (Preliminary result)   Collection Time: 01/08/22  2:12 PM   Specimen: BLOOD  Result Value Ref Range Status   Specimen Description BLOOD RIGHT ANTECUBITAL  Final   Special Requests   Final    BOTTLES DRAWN AEROBIC AND ANAEROBIC Blood Culture adequate volume   Culture   Final    NO GROWTH 3 DAYS Performed at [facility], [address], [place], [place] [address]    Report Status PENDING  Incomplete  CULTURE, BLOOD (ROUTINE X 2) w Reflex to ID Panel     Status: None (Preliminary result)   Collection Time: 01/08/22  2:14 PM   Specimen: BLOOD  Result Value Ref Range Status   Specimen Description BLOOD BLOOD RIGHT HAND  Final   Special Requests   Final    BOTTLES DRAWN AEROBIC AND ANAEROBIC Blood Culture results may not be optimal due to an inadequate volume of blood received in culture bottles   Culture   Final    NO GROWTH 3 DAYS Performed at [facility],  [address] [place], [place] [address]    Report Status PENDING  Incomplete         Radiology Studies: ECHOCARDIOGRAM  COMPLETE  Result Date: 01/10/2022    ECHOCARDIOGRAM REPORT   Patient Name:   Andrea Williamson Date of Exam: 01/10/2022 Medical Rec #:  [id number]  Height:       62.0 in Accession #:    [id number] Weight:       161.6 lb Date of Birth:  Oct 29, 1951  BSA:          1.746 m Patient Age:    71 years   BP:           145/75 mmHg Patient Gender: F          HR:           66 bpm. Exam Location:  [facility] Procedure: 2D Echo Indications:     Fever R50.9  History:         Patient has prior history of Echocardiogram examinations, most                  recent 10/02/2021.  Sonographer:     Kathlen Brunswick RDCS Referring Phys:  [id number] Wyvonnia Dusky Diagnosing Phys: Yolonda Kida MD IMPRESSIONS  1. Left ventricular ejection fraction, by estimation, is 65 to 70%. The left ventricle has normal function. The left ventricle has no regional wall motion abnormalities. Left ventricular diastolic parameters are consistent with Grade I diastolic dysfunction (impaired relaxation).  2. Right ventricular systolic function is normal. The right ventricular size is normal.  3. The mitral valve is grossly normal. No evidence of mitral valve regurgitation.  4. The aortic valve is normal in structure. Aortic valve regurgitation is not visualized. FINDINGS  Left Ventricle: Left ventricular ejection fraction, by estimation, is 65 to 70%. The left ventricle has normal function. The left ventricle has no regional wall motion abnormalities. The left ventricular internal cavity size was normal in size. There is  no left ventricular hypertrophy. Left ventricular diastolic parameters are consistent with Grade I diastolic dysfunction (impaired relaxation). Right Ventricle: The right ventricular size is normal. No increase in right ventricular wall thickness. Right ventricular systolic function is normal. Left Atrium: Left atrial  size was normal in size. Right Atrium: Right atrial size was normal in size. Pericardium: There is no evidence of pericardial effusion. Mitral Valve: The mitral valve is grossly normal. No evidence of mitral valve regurgitation. Tricuspid Valve: The tricuspid valve is normal in structure. Tricuspid valve regurgitation is not demonstrated. Aortic Valve: The aortic valve is normal in structure. Aortic valve regurgitation is not visualized. Aortic valve peak gradient measures 10.1 mmHg. Pulmonic Valve: The pulmonic valve was normal in structure. Pulmonic valve regurgitation is not visualized. Aorta: The ascending aorta was not well visualized. IAS/Shunts: No atrial level shunt detected by color flow Doppler.  LEFT VENTRICLE PLAX 2D LVIDd:         4.60 cm     Diastology LVIDs:         2.90 cm     LV e' medial:    5.22 cm/s LV PW:         1.40 cm     LV E/e' medial:  12.5 LV IVS:        1.30 cm     LV e' lateral:   3.81 cm/s LVOT diam:     1.80 cm     LV E/e' lateral: 17.1 LV SV:         58 LV SV Index:   33 LVOT Area:     2.54 cm  LV Volumes (MOD) LV vol  d, MOD A4C: 64.6 ml LV vol s, MOD A4C: 26.2 ml LV SV MOD A4C:     64.6 ml RIGHT VENTRICLE RV Basal diam:  3.30 cm RV S prime:     19.90 cm/s TAPSE (M-mode): 2.2 cm LEFT ATRIUM             Index        RIGHT ATRIUM           Index LA diam:        2.40 cm 1.37 cm/m   RA Area:     17.40 cm LA Vol (A2C):   33.9 ml 19.41 ml/m  RA Volume:   52.30 ml  29.95 ml/m LA Vol (A4C):   42.1 ml 24.11 ml/m LA Biplane Vol: 40.5 ml 23.19 ml/m  AORTIC VALVE                 PULMONIC VALVE AV Area (Vmax): 1.78 cm     PV Vmax:       1.14 m/s AV Vmax:        159.00 cm/s  PV Peak grad:  5.2 mmHg AV Peak Grad:   10.1 mmHg LVOT Vmax:      111.00 cm/s LVOT Vmean:     70.000 cm/s LVOT VTI:       0.229 m  AORTA Ao Root diam: 2.90 cm Ao Asc diam:  3.20 cm MITRAL VALVE               TRICUSPID VALVE MV Area (PHT): 3.46 cm    TV Peak grad:   22.8 mmHg MV Decel Time: 219 msec    TV Vmax:         2.39 m/s MV E velocity: 65.10 cm/s MV A velocity: 81.80 cm/s  SHUNTS MV E/A ratio:  0.80        Systemic VTI:  0.23 m                            Systemic Diam: 1.80 cm Dwayne D Callwood MD Electronically signed by Yolonda Kida MD Signature Date/Time: 01/10/2022/2:25:19 PM    Final         Scheduled Meds:  amLODipine  5 mg Oral Daily   atorvastatin  80 mg Oral Daily   docusate sodium  200 mg Oral BID   ferrous sulfate  325 mg Oral Q breakfast   folic acid  1 mg Oral Daily   magnesium gluconate  250 mg Oral QHS   metoprolol tartrate  25 mg Oral BID   metroNIDAZOLE  500 mg Oral BID   pantoprazole  40 mg Oral Daily   ticagrelor  90 mg Oral BID   vitamin B-12  100 mcg Oral Daily   Continuous Infusions:  cefTRIAXone (ROCEPHIN)  IV 2 g (01/11/22 0955)     LOS: 6 days    Time spent: 16 mins     Wyvonnia Dusky, MD [facility] Pager [phone]  If 7PM-7AM, please contact night-coverage 01/11/2022, 2:46 PM

## 2022-01-12 ENCOUNTER — Encounter: Payer: Self-pay | Admitting: Internal Medicine

## 2022-01-12 ENCOUNTER — Ambulatory Visit: Payer: Medicare Other | Admitting: Physical Therapy

## 2022-01-12 ENCOUNTER — Inpatient Hospital Stay: Payer: Medicare Other

## 2022-01-12 LAB — COMPREHENSIVE METABOLIC PANEL
ALT: 32 U/L (ref 0–44)
AST: 32 U/L (ref 15–41)
Albumin: 2.6 g/dL — ABNORMAL LOW (ref 3.5–5.0)
Alkaline Phosphatase: 59 U/L (ref 38–126)
Anion gap: 8 (ref 5–15)
BUN: 17 mg/dL (ref 8–23)
CO2: 24 mmol/L (ref 22–32)
Calcium: 8.8 mg/dL — ABNORMAL LOW (ref 8.9–10.3)
Chloride: 102 mmol/L (ref 98–111)
Creatinine, Ser: 0.95 mg/dL (ref 0.44–1.00)
GFR, Estimated: >60 mL/min (ref >60)
Glucose, Bld: 128 mg/dL — ABNORMAL HIGH (ref 70–99)
Potassium: 3.4 mmol/L — ABNORMAL LOW (ref 3.5–5.1)
Sodium: 134 mmol/L — ABNORMAL LOW (ref 135–145)
Total Bilirubin: 0.5 mg/dL (ref 0.3–1.2)
Total Protein: 6.1 g/dL — ABNORMAL LOW (ref 6.5–8.1)

## 2022-01-12 LAB — CBC WITH DIFFERENTIAL/PLATELET
Abs Immature Granulocytes: 0.55 10*3/uL — ABNORMAL HIGH (ref 0.00–0.07)
Basophils Absolute: 0.1 10*3/uL (ref 0.0–0.1)
Basophils Relative: 1 %
Eosinophils Absolute: 0.4 10*3/uL (ref 0.0–0.5)
Eosinophils Relative: 4 %
HCT: 23.3 % — ABNORMAL LOW (ref 36.0–46.0)
Hemoglobin: 7.7 g/dL — ABNORMAL LOW (ref 12.0–15.0)
Immature Granulocytes: 6 %
Lymphocytes Relative: 11 %
Lymphs Abs: 1 10*3/uL (ref 0.7–4.0)
MCH: 33.8 pg (ref 26.0–34.0)
MCHC: 33 g/dL (ref 30.0–36.0)
MCV: 102.2 fL — ABNORMAL HIGH (ref 80.0–100.0)
Monocytes Absolute: 0.5 10*3/uL (ref 0.1–1.0)
Monocytes Relative: 5 %
Neutro Abs: 7.2 10*3/uL (ref 1.7–7.7)
Neutrophils Relative %: 73 %
Platelets: 474 10*3/uL — ABNORMAL HIGH (ref 150–400)
RBC: 2.28 MIL/uL — ABNORMAL LOW (ref 3.87–5.11)
RDW: 14.8 % (ref 11.5–15.5)
Smear Review: NORMAL
WBC: 9.7 10*3/uL (ref 4.0–10.5)
nRBC: 1.2 % — ABNORMAL HIGH (ref 0.0–0.2)

## 2022-01-12 LAB — MAGNESIUM: Magnesium: 1.7 mg/dL (ref 1.7–2.4)

## 2022-01-12 IMAGING — CT CT CHEST-ABD-PELV W/ CM
3 of 5 series · 14 of 36 positions shown, 16 images · IV contrast (agent unspecified)
Comparison: Chest radiograph dated [DATE]. CT dated [DATE].

CLINICAL DATA: Sepsis.

EXAM:
CT CHEST, ABDOMEN, AND PELVIS WITH CONTRAST
TECHNIQUE: Multidetector CT imaging of the chest, abdomen and pelvis was
performed following the standard protocol during bolus
administration of intravenous contrast.

[Series 2: cap with · axial · 0.82mm/px · z∈[-1331,-816]mm · 9 of 129 slices shown, 11 images]
[im 13/129  mediastinal]
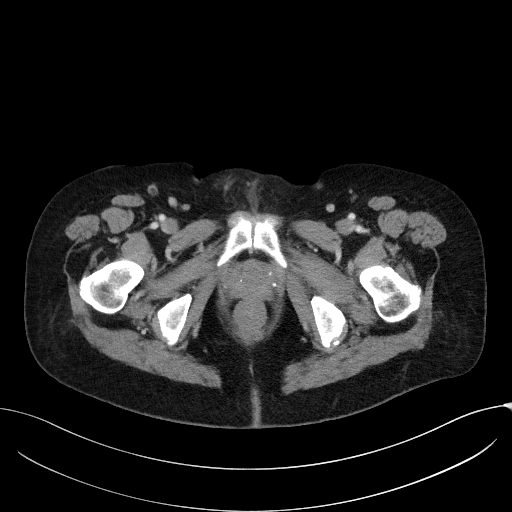
[im 13/129  bone]
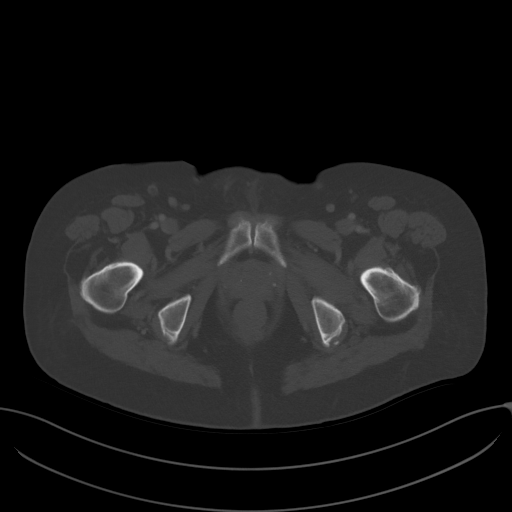
[im 26/129  mediastinal]
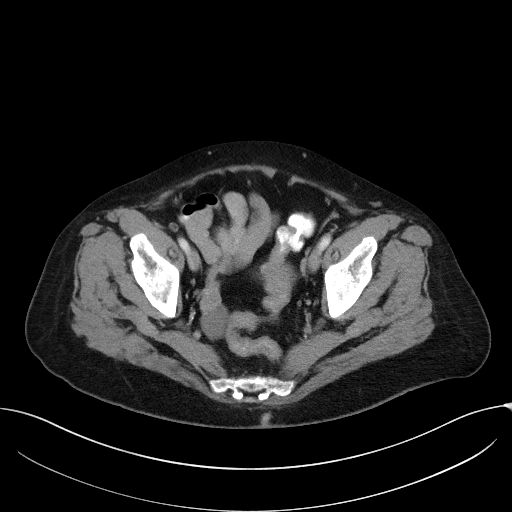
[im 39/129  mediastinal]
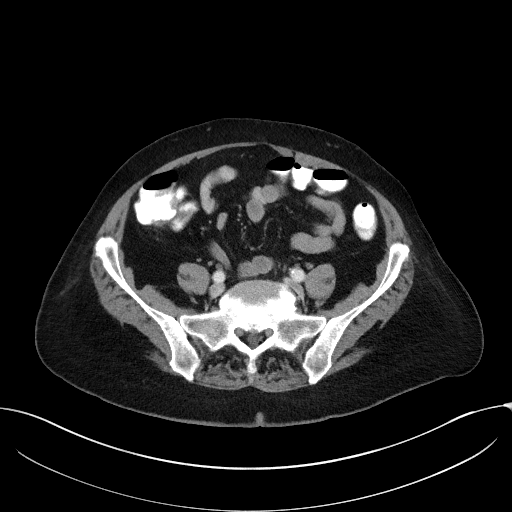
[im 52/129  mediastinal]
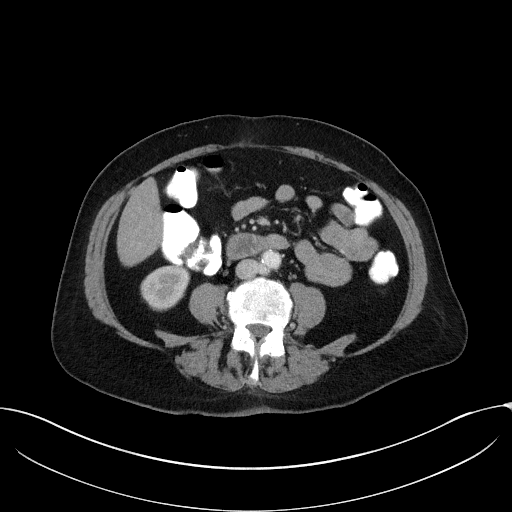
[im 65/129  mediastinal]
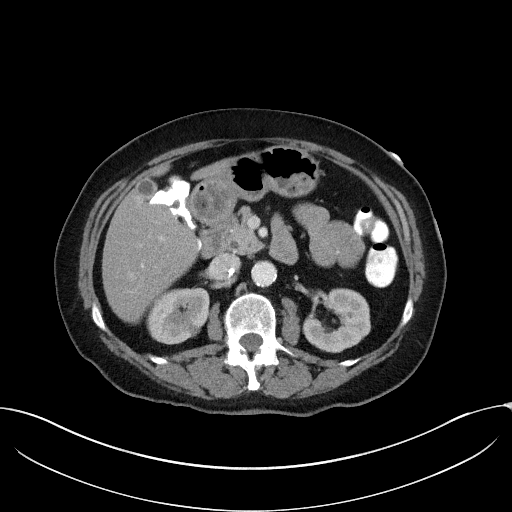
[im 77/129  mediastinal]
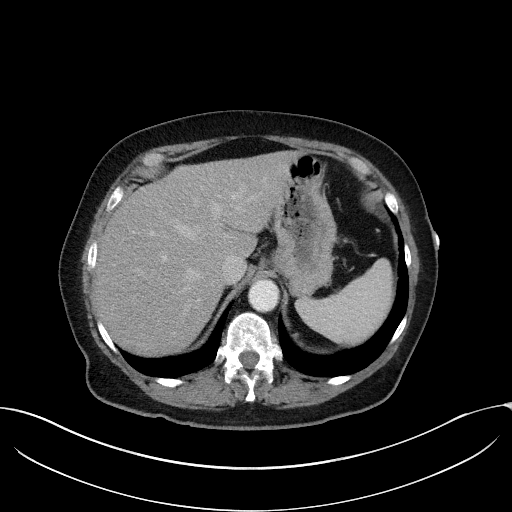
[im 90/129  mediastinal]
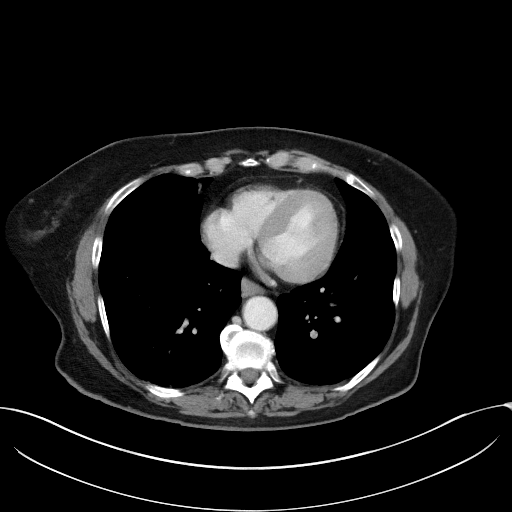
[im 103/129  mediastinal]
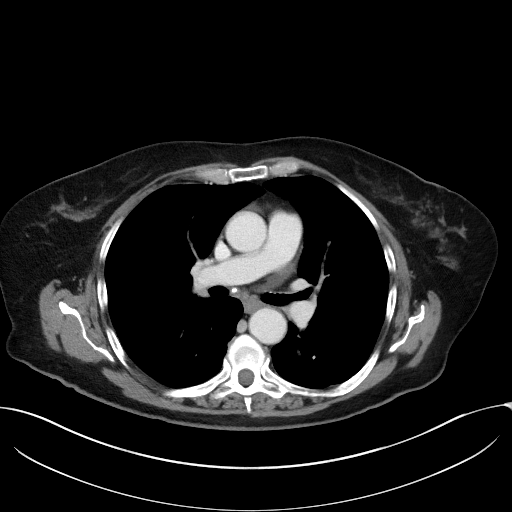
[im 116/129  mediastinal]
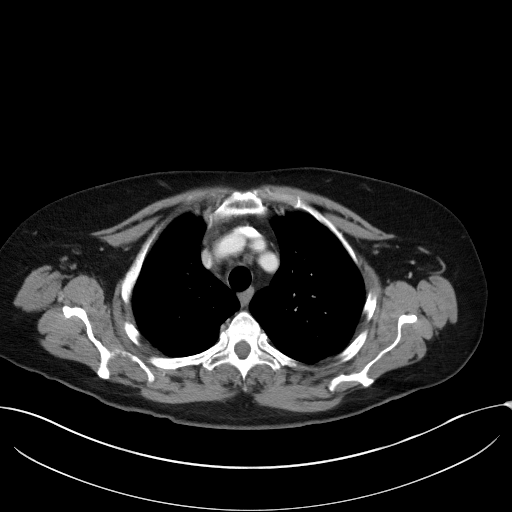
[im 116/129  bone]
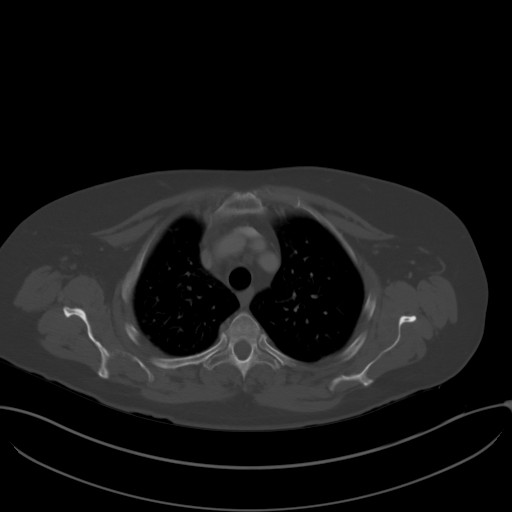

[Series 3: lung · axial · 0.82mm/px · z∈[-1075,-1021]mm · 2 of 176 slices shown]
[im 14/176  bone]
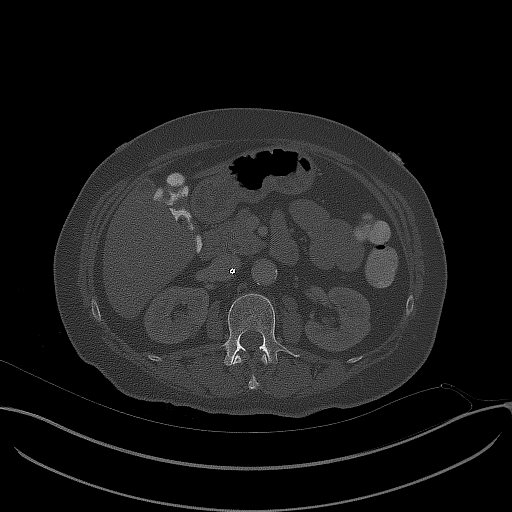
[im 41/176  bone]
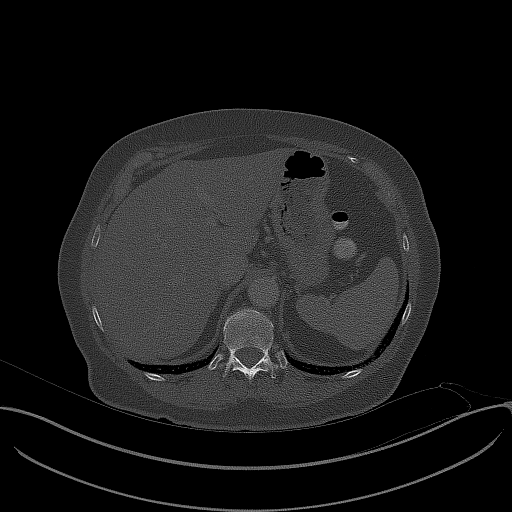

[Series 5: coronals · coronal · 0.80mm/px · 3 of 130 slices shown]
[im 26/130  mediastinal]
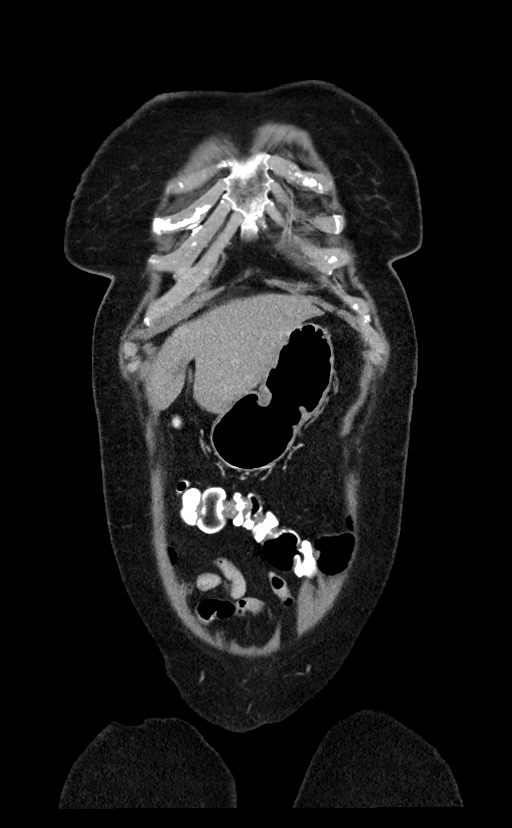
[im 52/130  mediastinal]
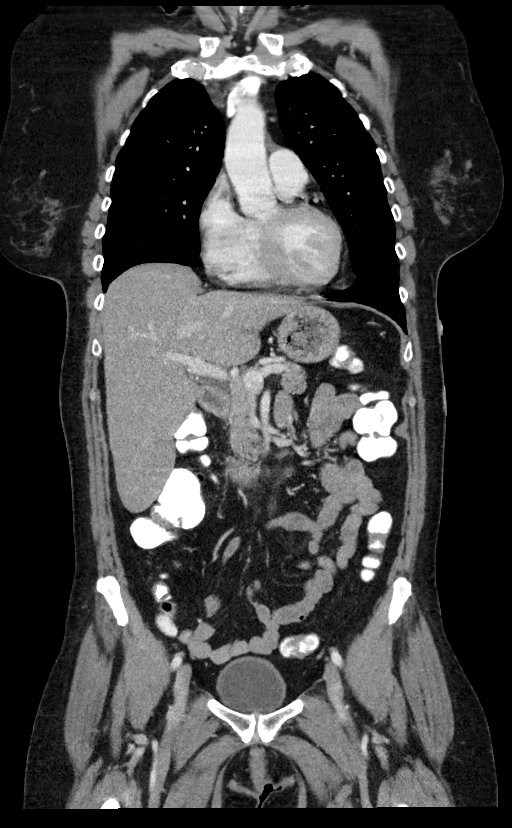
[im 78/130  mediastinal]
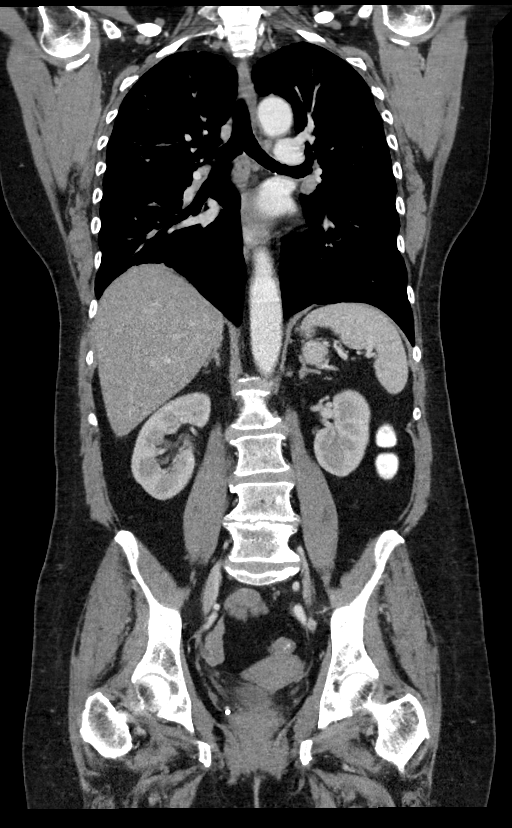

[14 of 36 positions shown; findings below may reference images not displayed]

RADIATION DOSE REDUCTION: This exam was performed according to the
departmental dose-optimization program which includes automated
exposure control, adjustment of the mA and/or kV according to
patient size and/or use of iterative reconstruction technique.

CONTRAST:  100mL OMNIPAQUE IOHEXOL 300 MG/ML  SOLN
FINDINGS: CT CHEST FINDINGS

Cardiovascular: There is no cardiomegaly or pericardial effusion.
There is coronary vascular calcification. Mild atherosclerotic
calcification of the thoracic aorta. No aneurysmal dilatation or
dissection. The origins of the great vessels of the aortic arch
appear patent. There is partially occlusive thrombus in the
posterior basal segment of the left lower lobe pulmonary artery
branch. No evidence of right heart straining.

Mediastinum/Nodes: No hilar or mediastinal adenopathy. Small hilar
and mediastinal calcified granuloma. The esophagus and the thyroid
gland are grossly unremarkable. No mediastinal fluid collection.

Lungs/Pleura: Background of centrilobular emphysema. There are
biapical subpleural scarring. No focal consolidation, pleural
effusion, or pneumothorax. The central airways are patent.

Musculoskeletal: Degenerative changes of the spine. No acute osseous
pathology.

CT ABDOMEN PELVIS FINDINGS

No intra-abdominal free air or free fluid.

Hepatobiliary: Fatty liver. No intrahepatic biliary dilatation. The
gallbladder is predominantly contracted. No calcified gallstone.
Slightly high attenuating content in the gallbladder fundus may
represent sludge, noncalcified stone, or focus of adenomyomatosis.
No pericholecystic fluid or evidence of acute cholecystitis by CT.

Pancreas: Unremarkable. No pancreatic ductal dilatation or
surrounding inflammatory changes.

Spleen: Normal in size without focal abnormality.

Adrenals/Urinary Tract: The adrenal glands unremarkable.
Subcentimeter right renal hypodense focus is too small to
characterize. There is no hydronephrosis on either side. There is
symmetric enhancement and excretion of contrast by both kidneys. The
visualized ureters and urinary bladder appear unremarkable.

Stomach/Bowel: No bowel obstruction or active inflammation. The
appendix is normal.

Vascular/Lymphatic: Moderate aortoiliac atherosclerotic disease. An
infrarenal IVC filter is noted. The IVC is unremarkable. No portal
venous gas. There is no adenopathy.

Reproductive: The uterus is anteverted. A 2.5 cm left uterine
fibroid noted. There is a 2.7 cm cyst in the right ovary similar to
prior CT. This was better evaluated on ultrasound of [DATE]. The
left ovary is unremarkable.

Other: Small fat containing umbilical hernia.

Musculoskeletal: Degenerative changes of the spine. No acute osseous
pathology.
IMPRESSION: 1. Partially occlusive left lower lobe posterior basal segmental
pulmonary artery embolism. No evidence of right heart straining.
2. No bowel obstruction. Normal appendix.
3. Fatty liver.
4. Aortic Atherosclerosis ([WY]-[WY]) and Emphysema ([WY]-[WY]).

These results were called by telephone at the time of interpretation
on [DATE] at [DATE] to nurse practitioner, KEE, who verbally
acknowledged these results.

## 2022-01-12 MED ORDER — POTASSIUM CHLORIDE CRYS ER 20 MEQ PO TBCR
20.0000 meq | EXTENDED_RELEASE_TABLET | Freq: Once | ORAL | Status: AC
  Administered 2022-01-12: 16:00:00 20 meq via ORAL
  Filled 2022-01-12: qty 1

## 2022-01-12 MED ORDER — IOHEXOL 9 MG/ML PO SOLN
500.0000 mL | ORAL | Status: AC
  Administered 2022-01-12 (×2): 500 mL via ORAL

## 2022-01-12 MED ORDER — IOHEXOL 300 MG/ML  SOLN
100.0000 mL | Freq: Once | INTRAMUSCULAR | Status: AC | PRN
  Administered 2022-01-12: 100 mL via INTRAVENOUS

## 2022-01-12 NOTE — Progress Notes (Signed)
PROGRESS NOTE   HPI was taken from Dr. Francine Graven: Andrea Williamson is a 71 y.o. 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  Patient states that she had some epistaxis at home and it only happened when she blew her nose but that has resolved.  She is unable to tell me if she has had any bloody stools or melena but per the ER provider  She had dark stools. She denies having any hematemesis, no cough, no abdominal pain, no chest pain, no shortness of breath, no nausea, no vomiting, no headache.  She has no urinary symptoms She did have a fever in the ER with a Tmax of 102.5. Review of Systems: As mentioned in the history of present illness. All other systems reviewed and are negative.   As per Dr. Jimmye Norman 2/22-2/28/23: Pt presented w/ symptomatic and acute blood loss anemia of unknown etiology. 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 No hemolysis as per heme/onco. Pt has not required a blood transfusion so far this admission. Of note, pt has continued to spike fevers of unknown etiology daily. ID was consulted and at first recommended to no abxs but then placed the pt on abxs. Pt still continues to spike fevers despite abxs. ID also ordered an autoimmune work-up which has all been neg except for low IgG. Low IgG was secondary to pt receiving rituxan in Jan 2023 for hemolytic anemia given by heme/onco. Parovirus is pending still. Also, pt has hx of DVT and was on eliquis. Eliquis has been held this admission secondary to acute blood loss anemia. Pt had IVC filter placed by vasc surg this admission as per heme/onco. Pt will  continue on home dose of brilinta however.     Andrea Williamson  SFK:[id number] DOB: 17-Jul-1951 DOA: 01/05/2022 PCP: [name], MD   Assessment & Plan:   Principal Problem:   Symptomatic anemia Active Problems:   History of stroke   Essential hypertension   Warm autoimmune hemolytic anemia (HCC)   DVT (deep venous thrombosis) (HCC)   Fever   Symptomatic & acute blood loss anemia: etiology unclear. W/ drop in H&H over last 3 weeks from 11.7 to 8.1. Continue on PPI. Not likely GI bleed as per GI. Continue on B12 as level was low. H&H are labile.   Fever:  etiology unclear, continues to spike fevers despite abxs. Blood cxs NGTD & repeat blood cxs NGTD. CXR shows pulmonary interstitial edema no pneumonia. COVID19, influenza are both neg. Continue on IV flagyl, rocephin as per ID. Autoimmune work-up is in progress as per ID. IgG is low, secondary to recent rituxan treatment. ANA comprehensive panel, ANCA, CMV, antiphospholipid syn panel were all neg.  ID following and recs apprec. Curbsided rheum, Dr. Jefm Bryant, who thinks it is unlikely pt has autoimmune/rheum issue   SVT: s/p IV lopressor x 1 on 01/08/22. Back in sinus rhythm. Continue on tele. IV lopressor prn. Echo shows EF 17-49%, grade I diastolic dysfunction & no atrial level shunt detected   DVT: s/p IVC filter placed on 01/06/22 by vasc surg. Continue to hold eliquis   Hx of warm autoimmune hemolytic anemia: was on rituxan w/ last  dose give 12/09/21. No hemolysis as per heme/onco   HTN: continue on amlodipine    Hx of CVA: due to left M2 occlusion status post thrombectomy with revascularization and left A2 stenting. Continue on brilinta and hold eliquis as per onco/heme   Hypokalemia: KCl repleated     DVT prophylaxis:  SCDs Code Status: full  Family Communication: discussed pt's care w/ pt's family at bedside and answered their questions  Disposition Plan: likely d/c home    Level of care: Med-Surg  Status is:  Inpatient Remains inpatient appropriate because: still spiking fevers of unknown etiology       Consultants:  Neuro Onco  GI  ID Cardio   Procedures:   Antimicrobials:   Subjective: Pt c/o being frustrated   Objective: Vitals:   01/11/22 1858 01/11/22 2121 01/11/22 2330 01/12/22 0502  BP:  125/78  128/67  Pulse:  84  70  Resp:  18  20  Temp: 99.1 F (37.3 C) 99 F (37.2 C) (!) 100.5 F (38.1 C) 99.3 F (37.4 C)  TempSrc: Oral   Oral  SpO2:  92%  99%  Weight:      Height:        Intake/Output Summary (Last 24 hours) at 01/12/2022 0824 Last data filed at 01/11/2022 1412 Gross per 24 hour  Intake 400 ml  Output --  Net 400 ml    Filed Weights   01/04/22 1830  Weight: 73.3 kg    Examination:  General exam: Appears frustrated Respiratory system: clear breath sounds b/l  Cardiovascular system: S1 & S2+. No rubs or clicks  Gastrointestinal system: Abd is soft, NT, ND & normal bowel sounds  Central nervous system: awake and alert. Moves all extremities  Psychiatry: judgement and insight appear normal. Agitated/frustrated mood and affect    Data Reviewed: I have personally reviewed following labs and imaging studies  CBC: Recent Labs  Lab 01/07/22 0408 01/08/22 0408 01/09/22 0633 01/10/22 0358 01/11/22 0411  WBC 9.3 9.9 9.0 9.4 9.9  HGB 7.7* 7.7* 7.5* 7.4* 7.9*  HCT 23.2* 23.0* 22.2* 22.8* 24.6*  MCV 105.0* 104.5* 103.3* 104.6* 104.7*  PLT 334 379 392 416* 751*   Basic Metabolic Panel: Recent Labs  Lab 01/07/22 0408 01/08/22 0408 01/09/22 0633 01/10/22 0358 01/11/22 0411 01/12/22 0350  NA 136 138 135 134* 137  --   K 3.2* 3.6 3.3* 3.2* 3.5  --   CL 105 104 102 102 102  --   CO2 22 24 20* 22 23  --   GLUCOSE 110* 95 101* 113* 66*  --   BUN 20 18 22 19 20   --   CREATININE 1.35* 1.09* 1.01* 1.04* 1.01*  --   CALCIUM 8.7* 8.9 8.8* 8.8* 9.1  --   MG  --   --   --   --  1.8 1.7   GFR: Estimated Creatinine Clearance: 48.6 mL/min (A) (by  C-G formula based on SCr of 1.01 mg/dL (H)). Liver Function Tests: Recent Labs  Lab 01/08/22 1412  AST 48*  ALT 42  ALKPHOS 62  BILITOT 0.8  PROT 6.9  ALBUMIN 2.8*   No results for input(s): LIPASE, AMYLASE in the last 168 hours. No results for input(s): AMMONIA in the last 168 hours. Coagulation Profile: No results for input(s): INR, PROTIME in the last 168 hours.  Cardiac Enzymes: No results for input(s): CKTOTAL, CKMB, CKMBINDEX, TROPONINI in the last 168 hours. BNP (last 3 results) No results for input(s): PROBNP in the  last 8760 hours. HbA1C: No results for input(s): HGBA1C in the last 72 hours. CBG: Recent Labs  Lab 01/05/22 0844  GLUCAP 126*   Lipid Profile: No results for input(s): CHOL, HDL, LDLCALC, TRIG, CHOLHDL, LDLDIRECT in the last 72 hours. Thyroid Function Tests: No results for input(s): TSH, T4TOTAL, FREET4, T3FREE, THYROIDAB in the last 72 hours.  Anemia Panel: No results for input(s): VITAMINB12, FOLATE, FERRITIN, TIBC, IRON, RETICCTPCT in the last 72 hours.  Sepsis Labs: Recent Labs  Lab 01/05/22 0926 01/05/22 1307 01/07/22 0408 01/08/22 1412  PROCALCITON  --   --  0.31 0.20  LATICACIDVEN 2.0* 1.5  --   --     Recent Results (from the past 240 hour(s))  Resp Panel by RT-PCR (Flu A&B, Covid) Nasopharyngeal Swab     Status: None   Collection Time: 01/05/22  3:53 AM   Specimen: Nasopharyngeal Swab; Nasopharyngeal(NP) swabs in vial transport medium  Result Value Ref Range Status   SARS Coronavirus 2 by RT PCR NEGATIVE NEGATIVE Final    Comment: (NOTE) SARS-CoV-2 target nucleic acids are NOT DETECTED.  The SARS-CoV-2 RNA is generally detectable in upper respiratory specimens during the acute phase of infection. The lowest concentration of SARS-CoV-2 viral copies this assay can detect is 138 copies/mL. A negative result does not preclude SARS-Cov-2 infection and should not be used as the sole basis for treatment or other patient management  decisions. A negative result may occur with  improper specimen collection/handling, submission of specimen other than nasopharyngeal swab, presence of viral mutation(s) within the areas targeted by this assay, and inadequate number of viral copies(<138 copies/mL). A negative result must be combined with clinical observations, patient history, and epidemiological information. The expected result is Negative.  Fact Sheet for Patients:  EntrepreneurPulse.com.au  Fact Sheet for Healthcare Providers:  IncredibleEmployment.be  This test is no t yet approved or cleared by the [place] FDA and  has been authorized for detection and/or diagnosis of SARS-CoV-2 by FDA under an Emergency Use Authorization (EUA). This EUA will remain  in effect (meaning this test can be used) for the duration of the COVID-19 declaration under Section 564(b)(1) of the Act, 21 U.S.C.section 360bbb-3(b)(1), unless the authorization is terminated  or revoked sooner.       Influenza A by PCR NEGATIVE NEGATIVE Final   Influenza B by PCR NEGATIVE NEGATIVE Final    Comment: (NOTE) The Xpert Xpress SARS-CoV-2/FLU/RSV plus assay is intended as an aid in the diagnosis of influenza from Nasopharyngeal swab specimens and should not be used as a sole basis for treatment. Nasal washings and aspirates are unacceptable for Xpert Xpress SARS-CoV-2/FLU/RSV testing.  Fact Sheet for Patients: EntrepreneurPulse.com.au  Fact Sheet for Healthcare Providers: IncredibleEmployment.be  This test is not yet approved or cleared by the [place] FDA and has been authorized for detection and/or diagnosis of SARS-CoV-2 by FDA under an Emergency Use Authorization (EUA). This EUA will remain in effect (meaning this test can be used) for the duration of the COVID-19 declaration under Section 564(b)(1) of the Act, 21 U.S.C. section 360bbb-3(b)(1), unless the  authorization is terminated or revoked.  Performed at [facility], [address] [place], [place] [address]   CULTURE, BLOOD (ROUTINE X 2) w Reflex to ID Panel     Status: None   Collection Time: 01/05/22  9:26 AM   Specimen: BLOOD  Result Value Ref Range Status   Specimen Description BLOOD RIGHT ANTECUBITAL  Final   Special Requests   Final  Blood Culture results may not be optimal due to an excessive volume of blood received in culture bottles   Culture   Final    NO GROWTH 5 DAYS Performed at [facility], [address] [place], [place] [address]    Report Status 01/10/2022 FINAL  Final  CULTURE, BLOOD (ROUTINE X 2) w Reflex to ID Panel     Status: None   Collection Time: 01/05/22  9:26 AM   Specimen: BLOOD  Result Value Ref Range Status   Specimen Description BLOOD BLOOD RIGHT HAND  Final   Special Requests   Final    BOTTLES DRAWN AEROBIC AND ANAEROBIC Blood Culture results may not be optimal due to an excessive volume of blood received in culture bottles   Culture   Final    NO GROWTH 5 DAYS Performed at [facility], [address] [place], [place] [address]    Report Status 01/10/2022 FINAL  Final  CULTURE, BLOOD (ROUTINE X 2) w Reflex to ID Panel     Status: None (Preliminary result)   Collection Time: 01/08/22  2:12 PM   Specimen: BLOOD  Result Value Ref Range Status   Specimen Description BLOOD RIGHT ANTECUBITAL  Final   Special Requests   Final    BOTTLES DRAWN AEROBIC AND ANAEROBIC Blood Culture adequate volume   Culture   Final    NO GROWTH 3 DAYS Performed at [facility], [address], [place], [place] [address]    Report Status PENDING  Incomplete  CULTURE, BLOOD (ROUTINE X 2) w Reflex to ID Panel     Status: None (Preliminary result)   Collection Time: 01/08/22  2:14 PM   Specimen: BLOOD  Result Value Ref Range Status   Specimen Description BLOOD BLOOD RIGHT HAND  Final   Special Requests   Final     BOTTLES DRAWN AEROBIC AND ANAEROBIC Blood Culture results may not be optimal due to an inadequate volume of blood received in culture bottles   Culture   Final    NO GROWTH 3 DAYS Performed at [facility], [address] [place], [place] [address]    Report Status PENDING  Incomplete         Radiology Studies: ECHOCARDIOGRAM COMPLETE  Result Date: 01/10/2022    ECHOCARDIOGRAM REPORT   Patient Name:   Andrea Williamson Date of Exam: 01/10/2022 Medical Rec #:  [id number]  Height:       62.0 in Accession #:    [id number] Weight:       161.6 lb Date of Birth:  July 22, 1951  BSA:          1.746 m Patient Age:    89 years   BP:           145/75 mmHg Patient Gender: F          HR:           66 bpm. Exam Location:  [facility] Procedure: 2D Echo Indications:     Fever R50.9  History:         Patient has prior history of Echocardiogram examinations, most                  recent 10/02/2021.  Sonographer:     Kathlen Brunswick RDCS Referring Phys:  [id number] Wyvonnia Dusky Diagnosing Phys: Yolonda Kida MD IMPRESSIONS  1. Left ventricular ejection fraction, by estimation, is 65 to 70%. The left ventricle has normal function. The left ventricle  has no regional wall motion abnormalities. Left ventricular diastolic parameters are consistent with Grade I diastolic dysfunction (impaired relaxation).  2. Right ventricular systolic function is normal. The right ventricular size is normal.  3. The mitral valve is grossly normal. No evidence of mitral valve regurgitation.  4. The aortic valve is normal in structure. Aortic valve regurgitation is not visualized. FINDINGS  Left Ventricle: Left ventricular ejection fraction, by estimation, is 65 to 70%. The left ventricle has normal function. The left ventricle has no regional wall motion abnormalities. The left ventricular internal cavity size was normal in size. There is  no left ventricular hypertrophy. Left ventricular diastolic parameters are consistent with Grade I  diastolic dysfunction (impaired relaxation). Right Ventricle: The right ventricular size is normal. No increase in right ventricular wall thickness. Right ventricular systolic function is normal. Left Atrium: Left atrial size was normal in size. Right Atrium: Right atrial size was normal in size. Pericardium: There is no evidence of pericardial effusion. Mitral Valve: The mitral valve is grossly normal. No evidence of mitral valve regurgitation. Tricuspid Valve: The tricuspid valve is normal in structure. Tricuspid valve regurgitation is not demonstrated. Aortic Valve: The aortic valve is normal in structure. Aortic valve regurgitation is not visualized. Aortic valve peak gradient measures 10.1 mmHg. Pulmonic Valve: The pulmonic valve was normal in structure. Pulmonic valve regurgitation is not visualized. Aorta: The ascending aorta was not well visualized. IAS/Shunts: No atrial level shunt detected by color flow Doppler.  LEFT VENTRICLE PLAX 2D LVIDd:         4.60 cm     Diastology LVIDs:         2.90 cm     LV e' medial:    5.22 cm/s LV PW:         1.40 cm     LV E/e' medial:  12.5 LV IVS:        1.30 cm     LV e' lateral:   3.81 cm/s LVOT diam:     1.80 cm     LV E/e' lateral: 17.1 LV SV:         58 LV SV Index:   33 LVOT Area:     2.54 cm  LV Volumes (MOD) LV vol d, MOD A4C: 64.6 ml LV vol s, MOD A4C: 26.2 ml LV SV MOD A4C:     64.6 ml RIGHT VENTRICLE RV Basal diam:  3.30 cm RV S prime:     19.90 cm/s TAPSE (M-mode): 2.2 cm LEFT ATRIUM             Index        RIGHT ATRIUM           Index LA diam:        2.40 cm 1.37 cm/m   RA Area:     17.40 cm LA Vol (A2C):   33.9 ml 19.41 ml/m  RA Volume:   52.30 ml  29.95 ml/m LA Vol (A4C):   42.1 ml 24.11 ml/m LA Biplane Vol: 40.5 ml 23.19 ml/m  AORTIC VALVE                 PULMONIC VALVE AV Area (Vmax): 1.78 cm     PV Vmax:       1.14 m/s AV Vmax:        159.00 cm/s  PV Peak grad:  5.2 mmHg AV Peak Grad:   10.1 mmHg LVOT Vmax:      111.00 cm/s LVOT Vmean:  70.000 cm/s LVOT VTI:       0.229 m  AORTA Ao Root diam: 2.90 cm Ao Asc diam:  3.20 cm MITRAL VALVE               TRICUSPID VALVE MV Area (PHT): 3.46 cm    TV Peak grad:   22.8 mmHg MV Decel Time: 219 msec    TV Vmax:        2.39 m/s MV E velocity: 65.10 cm/s MV A velocity: 81.80 cm/s  SHUNTS MV E/A ratio:  0.80        Systemic VTI:  0.23 m                            Systemic Diam: 1.80 cm Dwayne D Callwood MD Electronically signed by Yolonda Kida MD Signature Date/Time: 01/10/2022/2:25:19 PM    Final         Scheduled Meds:  amLODipine  5 mg Oral Daily   atorvastatin  80 mg Oral Daily   docusate sodium  200 mg Oral BID   ferrous sulfate  325 mg Oral Q breakfast   folic acid  1 mg Oral Daily   magnesium gluconate  250 mg Oral QHS   metoprolol tartrate  25 mg Oral BID   metroNIDAZOLE  500 mg Oral BID   pantoprazole  40 mg Oral Daily   ticagrelor  90 mg Oral BID   vitamin B-12  100 mcg Oral Daily   Continuous Infusions:  cefTRIAXone (ROCEPHIN)  IV Stopped (01/11/22 1849)     LOS: 7 days    Time spent: 18 mins     Wyvonnia Dusky, MD [facility] Pager [phone]  If 7PM-7AM, please contact night-coverage 01/12/2022, 8:24 AM

## 2022-01-12 NOTE — Progress Notes (Signed)
At 1915 this evening I went into pt room to introduce myself and found pt to be very upset and agitated, cursing and yelling. I noticed pt had blood on pillowcase and nasal canula was in floor with blood on both canula and floor. Pt stated her nose had been bleeding "forever" and she had called out at least 6 times for assistance. I calmly told pt that I was here to help her. I changed pt pillowcase and changed out nasal canula and added hydration to oxygen. Her nose was no longer bleeding at this time. Assured her I would be in and out of room all night and would check on her. Pt less agitated after assistance

## 2022-01-12 NOTE — Progress Notes (Signed)
Andrea Williamson for Infectious Disease  Date of Admission:  01/05/2022           Reason for visit: Follow up on FUO  Current antibiotics: Ceftriaxone 2/24-present MTZ 2/24-present   ASSESSMENT:    71 y.o. female admitted with:  Continued fevers: Unclear etiology thus far.  Infectious work-up was been unremarkable with negative blood cultures x4 sets.  She continues to be febrile despite empiric antibiotics.  Her imaging thus far has included a chest x-ray notable for mild interstitial edema and a TTE that did not show any evidence of vegetations.  Differential also includes reactivation of latent infections in the setting of recent rituximab infusions.  She was hepatitis B and C, HIV nonreactive in December.  LFTs and Tbili this admission were normal. CMV DNA PCR is also negative and parvovirus PCR is pending. 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  This was followed by hemolytic anemia 1 month later and bilateral DVTs of the lower extremities.  She was diagnosed with warm autoimmune hemolytic anemia and was given steroids and rituximab by oncology.  Her fourth and final dose of rituximab was on 12/09/2021.  Steroids were tapered and stopped around 12/30/2021. 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  Status post 4 doses of rituximab as noted above.  She received blood transfusion during her January admission. DVT: Status post IVC filter placed 01/06/2022 by vascular surgery.  Eliquis is on hold. History of CVA: As noted in #2.  She continues on Brilinta. Adnexal mass: Seen by gynecology/oncology during her January admission.  Plan for follow-up imaging in 3 months to reassess.  This was concerning for cystic neoplasm at the time.  RECOMMENDATIONS:    Unclear etiology of ongoing fevers and whether infectious vs  non-infectious Will continue antibiotics today pending imaging but low threshold to stop soon given lack of response to fevers, negative cultures, and normal PCT Will repeat CBC and CMP today Check blood smear to rule out Babesia given history of blood transfusion and Rituximab infusions.  She does not have any signs of hemolytic anemia, elevated LFTs, or thrombocytopenia but it is on the differential CT chest, abdomen, pelvis Consider repeat dopplers now that she is off anticoagulation.  Follow up parvovirus PCR Continue to consider non-infectious etiologies Will follow   Principal Problem:   Symptomatic anemia Active Problems:   History of stroke   Essential hypertension   Warm autoimmune hemolytic anemia (HCC)   DVT (deep venous thrombosis) (HCC)   Fever    MEDICATIONS:    Scheduled Meds:  amLODipine  5 mg Oral Daily   atorvastatin  80 mg Oral Daily   docusate sodium  200 mg Oral BID   ferrous sulfate  325 mg Oral Q breakfast   folic acid  1 mg Oral Daily   magnesium gluconate  250 mg Oral QHS   metoprolol tartrate  25 mg Oral BID   metroNIDAZOLE  500 mg Oral BID   pantoprazole  40 mg Oral Daily   ticagrelor  90 mg Oral BID   vitamin B-12  100 mcg Oral Daily   Continuous Infusions:  cefTRIAXone (ROCEPHIN)  IV Stopped (01/11/22 1849)   PRN Meds:.acetaminophen **OR** acetaminophen, ALPRAZolam, metoprolol tartrate, ondansetron **OR** ondansetron (ZOFRAN) IV  SUBJECTIVE:   24 hour events:  No acute events were noted overnight Echocardiogram done  2/26 was unremarkable with normal appearance of the mitral and aortic valves Stable lab abnormalities yesterday CMV DNA PCR negative Parvovirus PCR remains pending Autoimmune labs were negative Procalcitonin negative IgG immunoglobulin low likely due to Rituxan   Patient continues to spike fevers.  Tmax 101.2 F Complains of fatigue. She reports some dry cough.  On oxygen. No sinus pain or congestion.  Review of  Systems  All other systems reviewed and are negative.    OBJECTIVE:   Blood pressure 128/67, pulse 70, temperature 99.3 F (37.4 C), temperature source Oral, resp. rate 20, height 5\' 2"  (1.575 m), weight 73.3 kg, SpO2 99 %. Body mass index is 29.56 kg/m.  Physical Exam Constitutional:      General: She is not in acute distress.    Appearance: Normal appearance.  HENT:     Head: Normocephalic and atraumatic.  Eyes:     Extraocular Movements: Extraocular movements intact.     Conjunctiva/sclera: Conjunctivae normal.  Pulmonary:     Effort: Pulmonary effort is normal. No respiratory distress.     Comments: On nasal cannula. Abdominal:     General: There is no distension.     Palpations: Abdomen is soft.     Tenderness: There is no abdominal tenderness.  Musculoskeletal:        General: Normal range of motion.     Cervical back: Normal range of motion and neck supple.  Skin:    General: Skin is warm and dry.     Findings: No rash.  Neurological:     General: No focal deficit present.     Mental Status: She is alert and oriented to person, place, and time.  Psychiatric:        Mood and Affect: Mood normal.        Behavior: Behavior normal.     Lab Results: Lab Results  Component Value Date   WBC 9.9 01/11/2022   HGB 7.9 (L) 01/11/2022   HCT 24.6 (L) 01/11/2022   MCV 104.7 (H) 01/11/2022   PLT 453 (H) 01/11/2022    Lab Results  Component Value Date   NA 137 01/11/2022   K 3.5 01/11/2022   CO2 23 01/11/2022   GLUCOSE 66 (L) 01/11/2022   BUN 20 01/11/2022   CREATININE 1.01 (H) 01/11/2022   CALCIUM 9.1 01/11/2022   GFRNONAA 60 (L) 01/11/2022    Lab Results  Component Value Date   ALT 42 01/08/2022   AST 48 (H) 01/08/2022   ALKPHOS 62 01/08/2022   BILITOT 0.8 01/08/2022    No results found for: CRP  No results found for: ESRSEDRATE   I have reviewed the micro and lab results in Epic.  Imaging: ECHOCARDIOGRAM COMPLETE  Result Date: 01/10/2022     ECHOCARDIOGRAM REPORT   Patient Name:   Andrea Williamson Date of Exam: 01/10/2022 Medical Rec #:  [id number]  Height:       62.0 in Accession #:    [id number] Weight:       161.6 lb Date of Birth:  02-18-1951  BSA:          1.746 m Patient Age:    32 years   BP:           145/75 mmHg Patient Gender: F          HR:           66 bpm. Exam Location:  [facility] Procedure: 2D Echo Indications:     Fever R50.9  History:  Patient has prior history of Echocardiogram examinations, most                  recent 10/02/2021.  Sonographer:     Kathlen Brunswick RDCS Referring Phys:  [id number] Wyvonnia Dusky Diagnosing Phys: Yolonda Kida MD IMPRESSIONS  1. Left ventricular ejection fraction, by estimation, is 65 to 70%. The left ventricle has normal function. The left ventricle has no regional wall motion abnormalities. Left ventricular diastolic parameters are consistent with Grade I diastolic dysfunction (impaired relaxation).  2. Right ventricular systolic function is normal. The right ventricular size is normal.  3. The mitral valve is grossly normal. No evidence of mitral valve regurgitation.  4. The aortic valve is normal in structure. Aortic valve regurgitation is not visualized. FINDINGS  Left Ventricle: Left ventricular ejection fraction, by estimation, is 65 to 70%. The left ventricle has normal function. The left ventricle has no regional wall motion abnormalities. The left ventricular internal cavity size was normal in size. There is  no left ventricular hypertrophy. Left ventricular diastolic parameters are consistent with Grade I diastolic dysfunction (impaired relaxation). Right Ventricle: The right ventricular size is normal. No increase in right ventricular wall thickness. Right ventricular systolic function is normal. Left Atrium: Left atrial size was normal in size. Right Atrium: Right atrial size was normal in size. Pericardium: There is no evidence of pericardial effusion. Mitral Valve: The mitral valve is  grossly normal. No evidence of mitral valve regurgitation. Tricuspid Valve: The tricuspid valve is normal in structure. Tricuspid valve regurgitation is not demonstrated. Aortic Valve: The aortic valve is normal in structure. Aortic valve regurgitation is not visualized. Aortic valve peak gradient measures 10.1 mmHg. Pulmonic Valve: The pulmonic valve was normal in structure. Pulmonic valve regurgitation is not visualized. Aorta: The ascending aorta was not well visualized. IAS/Shunts: No atrial level shunt detected by color flow Doppler.  LEFT VENTRICLE PLAX 2D LVIDd:         4.60 cm     Diastology LVIDs:         2.90 cm     LV e' medial:    5.22 cm/s LV PW:         1.40 cm     LV E/e' medial:  12.5 LV IVS:        1.30 cm     LV e' lateral:   3.81 cm/s LVOT diam:     1.80 cm     LV E/e' lateral: 17.1 LV SV:         58 LV SV Index:   33 LVOT Area:     2.54 cm  LV Volumes (MOD) LV vol d, MOD A4C: 64.6 ml LV vol s, MOD A4C: 26.2 ml LV SV MOD A4C:     64.6 ml RIGHT VENTRICLE RV Basal diam:  3.30 cm RV S prime:     19.90 cm/s TAPSE (M-mode): 2.2 cm LEFT ATRIUM             Index        RIGHT ATRIUM           Index LA diam:        2.40 cm 1.37 cm/m   RA Area:     17.40 cm LA Vol (A2C):   33.9 ml 19.41 ml/m  RA Volume:   52.30 ml  29.95 ml/m LA Vol (A4C):   42.1 ml 24.11 ml/m LA Biplane Vol: 40.5 ml 23.19 ml/m  AORTIC VALVE  PULMONIC VALVE AV Area (Vmax): 1.78 cm     PV Vmax:       1.14 m/s AV Vmax:        159.00 cm/s  PV Peak grad:  5.2 mmHg AV Peak Grad:   10.1 mmHg LVOT Vmax:      111.00 cm/s LVOT Vmean:     70.000 cm/s LVOT VTI:       0.229 m  AORTA Ao Root diam: 2.90 cm Ao Asc diam:  3.20 cm MITRAL VALVE               TRICUSPID VALVE MV Area (PHT): 3.46 cm    TV Peak grad:   22.8 mmHg MV Decel Time: 219 msec    TV Vmax:        2.39 m/s MV E velocity: 65.10 cm/s MV A velocity: 81.80 cm/s  SHUNTS MV E/A ratio:  0.80        Systemic VTI:  0.23 m                            Systemic Diam: 1.80 cm  Yolonda Kida MD Electronically signed by Yolonda Kida MD Signature Date/Time: 01/10/2022/2:25:19 PM    Final      Imaging independently reviewed in Epic.    Raynelle Highland for Infectious Disease [facility] [phone] pager 01/12/2022, 7:41 AM  I spent greater than 35 minutes with the patient including greater than 50% of time in face to face counsel of the patient and in coordination of their care.

## 2022-01-12 NOTE — Progress Notes (Addendum)
° °      CROSS COVER NOTE  NAME: Andrea Williamson MRN: [id number] DOB : 1951/08/08   Received a call from Dr. Quintella Reichert with  radiology reporting a partially occlusive left lower lobe posterior basal segmental pulmonary artery embolism without evidence of right heart strain.  [name] is a 71 year old female with history significant for DVT and warm autoimmune hemolytic anemia who presented on 01/05/2022 with acute blood loss anemia.  At that time her home anticoagulation was held and she later had an IVC filter placed by vascular surgery at the recommendation of hematology.  Thus far this admission her hemoglobin has been fairly stable ranging from 7.4-8.1 and has not had any evidence of bleeding.  Spoke with on-call hematologist Dr. Tasia Catchings tonight for recommendations regarding restarting anticoagulation in the setting of new PE.  Tonight hematology equivocal on restarting anticoagulation and states there is no urgency to start anticoagulation overnight without right heart strain. Recommended risk vs benefit conversation with patient/family and if they decide to restart anticoagulation therapy recommended heparin drip without bolus.  Discussed radiology findings with patient and husband at bedside, explained what pulmonary embolism is and how we normally treat with anticoagulation. Explained that the primary risk is bleeding and potential decrease in platelets. Patient and husband's questions elicited and answered. At that time they were open to trialing heparin. After discussing with patient and family, nursing reports that patient had epistaxis today with significant amount of blood found on pillowcase at change of shift that had not been documented. Considering this, patient is asymptomatic on 3L [place], and hematology stating there is no urgency to start anticoagulation tonight without right heart strain, will defer anticoagulation retrial to day team in consult with Dr Janese Banks (patient's hematologist). Patient and  husband updated.  Neomia Glass MHA, MSN, FNP-BC Nurse Practitioner [facility] Pager [phone]

## 2022-01-13 LAB — BASIC METABOLIC PANEL
Anion gap: 6 (ref 5–15)
BUN: 13 mg/dL (ref 8–23)
CO2: 26 mmol/L (ref 22–32)
Calcium: 9 mg/dL (ref 8.9–10.3)
Chloride: 104 mmol/L (ref 98–111)
Creatinine, Ser: 0.89 mg/dL (ref 0.44–1.00)
GFR, Estimated: >60 mL/min (ref >60)
Glucose, Bld: 93 mg/dL (ref 70–99)
Potassium: 3.8 mmol/L (ref 3.5–5.1)
Sodium: 136 mmol/L (ref 135–145)

## 2022-01-13 LAB — CBC
HCT: 24.2 % — ABNORMAL LOW (ref 36.0–46.0)
Hemoglobin: 7.9 g/dL — ABNORMAL LOW (ref 12.0–15.0)
MCH: 33.5 pg (ref 26.0–34.0)
MCHC: 32.6 g/dL (ref 30.0–36.0)
MCV: 102.5 fL — ABNORMAL HIGH (ref 80.0–100.0)
Platelets: 480 10*3/uL — ABNORMAL HIGH (ref 150–400)
RBC: 2.36 MIL/uL — ABNORMAL LOW (ref 3.87–5.11)
RDW: 15.2 % (ref 11.5–15.5)
WBC: 10.2 10*3/uL (ref 4.0–10.5)
nRBC: 1.3 % — ABNORMAL HIGH (ref 0.0–0.2)

## 2022-01-13 LAB — CULTURE, BLOOD (ROUTINE X 2)
Culture: NO GROWTH
Culture: NO GROWTH
Special Requests: ADEQUATE

## 2022-01-13 LAB — BETA-2-GLYCOPROTEIN I ABS, IGG/M/A
Beta-2 Glyco I IgG: <9 GPI IgG units (ref 0–20)
Beta-2-Glycoprotein I IgA: <9 GPI IgA units (ref 0–25)
Beta-2-Glycoprotein I IgM: <9 GPI IgM units (ref 0–32)

## 2022-01-13 LAB — PROTIME-INR
INR: 1.1 (ref 0.8–1.2)
Prothrombin Time: 14.2 seconds (ref 11.4–15.2)

## 2022-01-13 LAB — HUMAN PARVOVIRUS DNA DETECTION BY PCR: Parvovirus B19, PCR: NEGATIVE

## 2022-01-13 LAB — APTT: aPTT: 39 seconds — ABNORMAL HIGH (ref 24–36)

## 2022-01-13 LAB — MAGNESIUM: Magnesium: 1.9 mg/dL (ref 1.7–2.4)

## 2022-01-13 LAB — HEPARIN LEVEL (UNFRACTIONATED): Heparin Unfractionated: <0.1 IU/mL — ABNORMAL LOW (ref 0.30–0.70)

## 2022-01-13 MED ORDER — HEPARIN BOLUS VIA INFUSION
4000.0000 [IU] | Freq: Once | INTRAVENOUS | Status: AC
  Administered 2022-01-13: 4000 [IU] via INTRAVENOUS
  Filled 2022-01-13: qty 4000

## 2022-01-13 MED ORDER — HEPARIN (PORCINE) 25000 UT/250ML-% IV SOLN
1150.0000 [IU]/h | INTRAVENOUS | Status: DC
  Administered 2022-01-13: 20:00:00 1000 [IU]/h via INTRAVENOUS
  Filled 2022-01-13: qty 250

## 2022-01-13 NOTE — Progress Notes (Signed)
PROGRESS NOTE   HPI was taken from Dr. Francine Graven: Andrea Williamson is a 71 y.o. 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  Patient states that she had some epistaxis at home and it only happened when she blew her nose but that has resolved.  She is unable to tell me if she has had any bloody stools or melena but per the ER provider  She had dark stools. She denies having any hematemesis, no cough, no abdominal pain, no chest pain, no shortness of breath, no nausea, no vomiting, no headache.  She has no urinary symptoms She did have a fever in the ER with a Tmax of 102.5. Review of Systems: As mentioned in the history of present illness. All other systems reviewed and are negative.   As per Dr. Jimmye Norman 2/22-2/28/23: Pt presented w/ symptomatic and acute blood loss anemia of unknown etiology. 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 No hemolysis as per heme/onco. Pt has not required a blood transfusion so far this admission. Of note, pt has continued to spike fevers of unknown etiology daily. ID was consulted and at first recommended to no abxs but then placed the pt on abxs. Pt still continues to spike fevers despite abxs. ID also ordered an autoimmune work-up which has all been neg except for low IgG. Low IgG was secondary to pt receiving rituxan in Jan 2023 for hemolytic anemia given by heme/onco. Parovirus is pending still. Also, pt has hx of DVT and was on eliquis. Eliquis has been held this admission secondary to acute blood loss anemia. Pt had IVC filter placed by vasc surg this admission as per heme/onco. Pt will  continue on home dose of brilinta however.     Andrea Williamson  IWL:[id number] DOB: 1951/02/26 DOA: 01/05/2022 PCP: [name], MD   Assessment & Plan:   Principal Problem:   Symptomatic anemia Active Problems:   History of stroke   Essential hypertension   Warm autoimmune hemolytic anemia (HCC)   DVT (deep venous thrombosis) (HCC)   Fever   Anemia: recent hx warm hemolytic anemia but no signs of hemolysis. Hgb on admission around 8 and has stayed steady there, from 11.7 few weeks ago. GI does not think GI bleed. Heme following.   Fever:  etiology unclear, continues to spike fevers despite abxs, though last fever was now > 24 hours ago. Blood cxs NGTD & repeat blood cxs NGTD. CXR shows pulmonary interstitial edema no pneumonia. COVID19, influenza are both neg. Continue on IV flagyl, rocephin as per ID. ID following and recs apprec. Previous provider curbsided rheum, Dr. Jefm Bryant, who thinks it is unlikely pt has autoimmune/rheum issue. PE could be source of fever  SVT: s/p IV lopressor x 1 on 01/08/22. Back in sinus rhythm. Continue on tele. IV lopressor prn. Echo shows EF 17-40%, grade I diastolic dysfunction & no atrial level shunt detected   DVT, PE: s/p IVC filter placed on 01/06/22 by vasc surg. Now with new segmental PE seen on 2/28. Discussed w/ onc, we will start heparin, plan to transition to eluis in 24-48 hours assuming tolerates. TTE from a few days ago no right heart  strain and no signs of decompensation today so holding on repeat.   Hypoxic respiratory failure: likely 2/2 atelectasis, PE. Continuing Gravity O2, wean as able. IS ordered.  Hx of warm autoimmune hemolytic anemia: was on rituxan w/ last dose give 12/09/21. No hemolysis as per heme/onco   HTN: continue on amlodipine    Hx of CVA: due to left M2 occlusion status post thrombectomy with revascularization and left A2 stenting. Continuing brillinta  Adnexal mass: will need outpt gyn/onc f/u    DVT prophylaxis:   heparin Code Status: full  Family Communication: discussed pt's care w/ pt's husband at bedside and answered their questions  Disposition Plan: likely d/c home, will re-consult PT today   Level of care: Med-Surg  Status is: Inpatient Remains inpatient appropriate because: severity of illness     Consultants:  Heme/onc  GI  ID Cardio   Procedures:   Antimicrobials:   Subjective: Pt c/o being frustrated. No pain. No bleeding last 24 hours from nose or elsewhere.   Objective: Vitals:   01/12/22 2109 01/12/22 2338 01/13/22 0557 01/13/22 0752  BP: (!) 109/92 (!) 116/57 (!) 144/74 (!) 145/84  Pulse: 85 70 78 73  Resp:   18 14  Temp: 98.2 F (36.8 C) 98.2 F (36.8 C) 99.3 F (37.4 C) 99.2 F (37.3 C)  TempSrc: Oral Oral    SpO2: 91% 92% 92% 94%  Weight:      Height:        Intake/Output Summary (Last 24 hours) at 01/13/2022 1319 Last data filed at 01/13/2022 1018 Gross per 24 hour  Intake 0 ml  Output --  Net 0 ml    Filed Weights   01/04/22 1830  Weight: 73.3 kg    Examination:  General exam: Appears frustrated Respiratory system: clear breath sounds b/l  Cardiovascular system: S1 & S2+. No rubs or clicks  Gastrointestinal system: Abd is soft, NT, ND & normal bowel sounds  Central nervous system: awake and alert. Moves all extremities  Psychiatry: judgement and insight appear normal. Agitated/frustrated mood and affect    Data Reviewed: I have personally reviewed following labs and imaging studies  CBC: Recent Labs  Lab 01/09/22 0633 01/10/22 0358 01/11/22 0411 01/12/22 0914 01/13/22 0501  WBC 9.0 9.4 9.9 9.7 10.2  NEUTROABS  --   --   --  7.2  --   HGB 7.5* 7.4* 7.9* 7.7* 7.9*  HCT 22.2* 22.8* 24.6* 23.3* 24.2*  MCV 103.3* 104.6* 104.7* 102.2* 102.5*  PLT 392 416* 453* 474* 591*   Basic Metabolic Panel: Recent Labs  Lab 01/09/22 0633 01/10/22 0358 01/11/22 0411 01/12/22 0350 01/12/22 0914 01/13/22 0501  NA 135 134* 137  --  134* 136   K 3.3* 3.2* 3.5  --  3.4* 3.8  CL 102 102 102  --  102 104  CO2 20* 22 23  --  24 26  GLUCOSE 101* 113* 66*  --  128* 93  BUN 22 19 20   --  17 13  CREATININE 1.01* 1.04* 1.01*  --  0.95 0.89  CALCIUM 8.8* 8.8* 9.1  --  8.8* 9.0  MG  --   --  1.8 1.7  --  1.9   GFR: Estimated Creatinine Clearance: 55.2 mL/min (by C-G formula based on SCr of 0.89 mg/dL). Liver Function Tests: Recent Labs  Lab 01/08/22 1412 01/12/22 0914  AST 48* 32  ALT 42 32  ALKPHOS 62 59  BILITOT 0.8 0.5  PROT 6.9 6.1*  ALBUMIN  2.8* 2.6*   No results for input(s): LIPASE, AMYLASE in the last 168 hours. No results for input(s): AMMONIA in the last 168 hours. Coagulation Profile: No results for input(s): INR, PROTIME in the last 168 hours.  Cardiac Enzymes: No results for input(s): CKTOTAL, CKMB, CKMBINDEX, TROPONINI in the last 168 hours. BNP (last 3 results) No results for input(s): PROBNP in the last 8760 hours. HbA1C: No results for input(s): HGBA1C in the last 72 hours. CBG: No results for input(s): GLUCAP in the last 168 hours.  Lipid Profile: No results for input(s): CHOL, HDL, LDLCALC, TRIG, CHOLHDL, LDLDIRECT in the last 72 hours. Thyroid Function Tests: No results for input(s): TSH, T4TOTAL, FREET4, T3FREE, THYROIDAB in the last 72 hours.  Anemia Panel: No results for input(s): VITAMINB12, FOLATE, FERRITIN, TIBC, IRON, RETICCTPCT in the last 72 hours.  Sepsis Labs: Recent Labs  Lab 01/07/22 0408 01/08/22 1412  PROCALCITON 0.31 0.20    Recent Results (from the past 240 hour(s))  Resp Panel by RT-PCR (Flu A&B, Covid) Nasopharyngeal Swab     Status: None   Collection Time: 01/05/22  3:53 AM   Specimen: Nasopharyngeal Swab; Nasopharyngeal(NP) swabs in vial transport medium  Result Value Ref Range Status   SARS Coronavirus 2 by RT PCR NEGATIVE NEGATIVE Final    Comment: (NOTE) SARS-CoV-2 target nucleic acids are NOT DETECTED.  The SARS-CoV-2 RNA is generally detectable in upper  respiratory specimens during the acute phase of infection. The lowest concentration of SARS-CoV-2 viral copies this assay can detect is 138 copies/mL. A negative result does not preclude SARS-Cov-2 infection and should not be used as the sole basis for treatment or other patient management decisions. A negative result may occur with  improper specimen collection/handling, submission of specimen other than nasopharyngeal swab, presence of viral mutation(s) within the areas targeted by this assay, and inadequate number of viral copies(<138 copies/mL). A negative result must be combined with clinical observations, patient history, and epidemiological information. The expected result is Negative.  Fact Sheet for Patients:  EntrepreneurPulse.com.au  Fact Sheet for Healthcare Providers:  IncredibleEmployment.be  This test is no t yet approved or cleared by the [place] FDA and  has been authorized for detection and/or diagnosis of SARS-CoV-2 by FDA under an Emergency Use Authorization (EUA). This EUA will remain  in effect (meaning this test can be used) for the duration of the COVID-19 declaration under Section 564(b)(1) of the Act, 21 U.S.C.section 360bbb-3(b)(1), unless the authorization is terminated  or revoked sooner.       Influenza A by PCR NEGATIVE NEGATIVE Final   Influenza B by PCR NEGATIVE NEGATIVE Final    Comment: (NOTE) The Xpert Xpress SARS-CoV-2/FLU/RSV plus assay is intended as an aid in the diagnosis of influenza from Nasopharyngeal swab specimens and should not be used as a sole basis for treatment. Nasal washings and aspirates are unacceptable for Xpert Xpress SARS-CoV-2/FLU/RSV testing.  Fact Sheet for Patients: EntrepreneurPulse.com.au  Fact Sheet for Healthcare Providers: IncredibleEmployment.be  This test is not yet approved or cleared by the [place] FDA and has been  authorized for detection and/or diagnosis of SARS-CoV-2 by FDA under an Emergency Use Authorization (EUA). This EUA will remain in effect (meaning this test can be used) for the duration of the COVID-19 declaration under Section 564(b)(1) of the Act, 21 U.S.C. section 360bbb-3(b)(1), unless the authorization is terminated or revoked.  Performed at [facility], [address] [place], [place] [address]   CULTURE, BLOOD (ROUTINE X 2)  w Reflex to ID Panel     Status: None   Collection Time: 01/05/22  9:26 AM   Specimen: BLOOD  Result Value Ref Range Status   Specimen Description BLOOD RIGHT ANTECUBITAL  Final   Special Requests   Final    Blood Culture results may not be optimal due to an excessive volume of blood received in culture bottles   Culture   Final    NO GROWTH 5 DAYS Performed at [facility], [address] [place], [place] [address]    Report Status 01/10/2022 FINAL  Final  CULTURE, BLOOD (ROUTINE X 2) w Reflex to ID Panel     Status: None   Collection Time: 01/05/22  9:26 AM   Specimen: BLOOD  Result Value Ref Range Status   Specimen Description BLOOD BLOOD RIGHT HAND  Final   Special Requests   Final    BOTTLES DRAWN AEROBIC AND ANAEROBIC Blood Culture results may not be optimal due to an excessive volume of blood received in culture bottles   Culture   Final    NO GROWTH 5 DAYS Performed at [facility], [address] [place], [place] [address]    Report Status 01/10/2022 FINAL  Final  CULTURE, BLOOD (ROUTINE X 2) w Reflex to ID Panel     Status: None   Collection Time: 01/08/22  2:12 PM   Specimen: BLOOD  Result Value Ref Range Status   Specimen Description BLOOD RIGHT ANTECUBITAL  Final   Special Requests   Final    BOTTLES DRAWN AEROBIC AND ANAEROBIC Blood Culture adequate volume   Culture   Final    NO GROWTH 5 DAYS Performed at [facility], [address] [place], [place] [address]    Report Status  01/13/2022 FINAL  Final  CULTURE, BLOOD (ROUTINE X 2) w Reflex to ID Panel     Status: None   Collection Time: 01/08/22  2:14 PM   Specimen: BLOOD  Result Value Ref Range Status   Specimen Description BLOOD BLOOD RIGHT HAND  Final   Special Requests   Final    BOTTLES DRAWN AEROBIC AND ANAEROBIC Blood Culture results may not be optimal due to an inadequate volume of blood received in culture bottles   Culture   Final    NO GROWTH 5 DAYS Performed at [facility], [address], [place], [place] [address]    Report Status 01/13/2022 FINAL  Final         Radiology Studies: CT CHEST ABDOMEN PELVIS W CONTRAST  Result Date: 01/12/2022 CLINICAL DATA:  Sepsis. EXAM: CT CHEST, ABDOMEN, AND PELVIS WITH CONTRAST TECHNIQUE: Multidetector CT imaging of the chest, abdomen and pelvis was performed following the standard protocol during bolus administration of intravenous contrast. RADIATION DOSE REDUCTION: This exam was performed according to the departmental dose-optimization program which includes automated exposure control, adjustment of the mA and/or kV according to patient size and/or use of iterative reconstruction technique. CONTRAST:  132mL OMNIPAQUE IOHEXOL 300 MG/ML  SOLN COMPARISON:  Chest radiograph dated 01/05/2022. CT dated 11/13/2021. FINDINGS: CT CHEST FINDINGS Cardiovascular: There is no cardiomegaly or pericardial effusion. There is coronary vascular calcification. Mild atherosclerotic calcification of the thoracic aorta. No aneurysmal dilatation or dissection. The origins of the great vessels of the aortic arch appear patent. There is partially occlusive thrombus in the posterior basal segment of the left lower lobe pulmonary artery branch. No evidence of right heart straining. Mediastinum/Nodes: No hilar or mediastinal adenopathy. Small  hilar and mediastinal calcified granuloma. The esophagus and the thyroid gland are grossly unremarkable. No mediastinal fluid collection.  Lungs/Pleura: Background of centrilobular emphysema. There are biapical subpleural scarring. No focal consolidation, pleural effusion, or pneumothorax. The central airways are patent. Musculoskeletal: Degenerative changes of the spine. No acute osseous pathology. CT ABDOMEN PELVIS FINDINGS No intra-abdominal free air or free fluid. Hepatobiliary: Fatty liver. No intrahepatic biliary dilatation. The gallbladder is predominantly contracted. No calcified gallstone. Slightly high attenuating content in the gallbladder fundus may represent sludge, noncalcified stone, or focus of adenomyomatosis. No pericholecystic fluid or evidence of acute cholecystitis by CT. Pancreas: Unremarkable. No pancreatic ductal dilatation or surrounding inflammatory changes. Spleen: Normal in size without focal abnormality. Adrenals/Urinary Tract: The adrenal glands unremarkable. Subcentimeter right renal hypodense focus is too small to characterize. There is no hydronephrosis on either side. There is symmetric enhancement and excretion of contrast by both kidneys. The visualized ureters and urinary bladder appear unremarkable. Stomach/Bowel: No bowel obstruction or active inflammation. The appendix is normal. Vascular/Lymphatic: Moderate aortoiliac atherosclerotic disease. An infrarenal IVC filter is noted. The IVC is unremarkable. No portal venous gas. There is no adenopathy. Reproductive: The uterus is anteverted. A 2.5 cm left uterine fibroid noted. There is a 2.7 cm cyst in the right ovary similar to prior CT. This was better evaluated on ultrasound of 11/13/2021. The left ovary is unremarkable. Other: Small fat containing umbilical hernia. Musculoskeletal: Degenerative changes of the spine. No acute osseous pathology. IMPRESSION: 1. Partially occlusive left lower lobe posterior basal segmental pulmonary artery embolism. No evidence of right heart straining. 2. No bowel obstruction. Normal appendix. 3. Fatty liver. 4. Aortic  Atherosclerosis (ICD10-I70.0) and Emphysema (ICD10-J43.9). These results were called by telephone at the time of interpretation on 01/12/2022 at 8:32 pm to nurse practitioner, Otilio Miu, who verbally acknowledged these results. Electronically Signed   By: Anner Crete M.D.   On: 01/12/2022 20:37        Scheduled Meds:  amLODipine  5 mg Oral Daily   atorvastatin  80 mg Oral Daily   docusate sodium  200 mg Oral BID   ferrous sulfate  325 mg Oral Q breakfast   folic acid  1 mg Oral Daily   magnesium gluconate  250 mg Oral QHS   metoprolol tartrate  25 mg Oral BID   metroNIDAZOLE  500 mg Oral BID   pantoprazole  40 mg Oral Daily   ticagrelor  90 mg Oral BID   vitamin B-12  100 mcg Oral Daily   Continuous Infusions:  cefTRIAXone (ROCEPHIN)  IV 2 g (01/13/22 1300)     LOS: 8 days    Time spent: 30 mins     Desma Maxim, MD [facility]  If 7PM-7AM, please contact night-coverage 01/13/2022, 1:19 PM

## 2022-01-13 NOTE — Progress Notes (Addendum)
Mobility Specialist - Progress Note ? ? 01/13/22 1400  ?Mobility  ?Activity Stood at bedside;Dangled on edge of bed  ?Level of Assistance Standby assist, set-up cues, supervision of patient - no hands on  ?Assistive Device Front wheel walker  ?Distance Ambulated (ft) 0 ft  ?Activity Response Tolerated fair  ?$Mobility charge 1 Mobility  ? ? ?Pre-mobility: 87 HR, 92% SpO2 ? ? ?Pt sleeping upon arrival, utilizing 3L. Pt awakened by voice. Agitated about mobility's request for OOB activity. Encouragement needed for participation of session, agreeable. Pt voiced need to void and bedpan was provided d/t immediate need. Loose, dark stool noted. Extensive time needed to achieve EOB with minA. VC for foot placement as pt tries supine<>sit with legs crossed. Pt voiced dizziness throughout session, further ambulation deferred. RUE trembling. Pt took 2 lateral steps towards HOB, pushing RW aside; with foot cross-over. Pt returned supine with family at bedside. Does voice some weakness. RN entered at end of session and was notified of performance.  ? ? ?Andrea Williamson ?Mobility Specialist ?01/13/22, 2:29 PM ? ? ?

## 2022-01-13 NOTE — Progress Notes (Signed)
Patient IV access infiltrated while starting the Heparin infusion with bolus, less then 10cc infused from bolus. MD notified through secure chat. Pharmacy notified. ?

## 2022-01-13 NOTE — Progress Notes (Signed)
Hematology/Oncology Consult note [facility]  Telephone:([phone] [phone]  Patient Care Team: [name], MD as PCP - General (Oncology) [name], MD as Consulting Physician (Hematology and Oncology)   Name of the patient: Andrea Williamson  [id number]  12-Mar-1951   Date of visit: 01/13/22   Interval history-she was sleepy at the time of my visit today.  Reported some mild self-limited nosebleeds yesterday after heparin was started.   Review of systems- Review of Systems  Constitutional:  Positive for malaise/fatigue. Negative for chills, fever and weight loss.  HENT:  Positive for nosebleeds. Negative for congestion and ear discharge.   Eyes:  Negative for blurred vision.  Respiratory:  Negative for cough, hemoptysis, sputum production, shortness of breath and wheezing.   Cardiovascular:  Negative for chest pain, palpitations, orthopnea and claudication.  Gastrointestinal:  Negative for abdominal pain, blood in stool, constipation, diarrhea, heartburn, melena, nausea and vomiting.  Genitourinary:  Negative for dysuria, flank pain, frequency, hematuria and urgency.  Musculoskeletal:  Negative for back pain, joint pain and myalgias.  Skin:  Negative for rash.  Neurological:  Negative for dizziness, tingling, focal weakness, seizures, weakness and headaches.  Endo/Heme/Allergies:  Does not bruise/bleed easily.  Psychiatric/Behavioral:  Negative for depression and suicidal ideas. The patient does not have insomnia.      No Known Allergies   Past Medical History:  Diagnosis Date   Deep vein thrombosis (DVT) ([place])    Hypertension    Stroke [facility])      Past Surgical History:  Procedure Laterality Date   ANKLE SURGERY Left 2008   IR CT HEAD LTD  10/05/2021   IR INTRA CRAN STENT  10/01/2021   IR PERCUTANEOUS ART THROMBECTOMY/INFUSION INTRACRANIAL INC DIAG ANGIO  10/01/2021   IR US GUIDE VASC ACCESS RIGHT  10/01/2021   IVC FILTER  INSERTION N/A 01/06/2022   Procedure: IVC FILTER INSERTION;  Surgeon: Algernon Huxley, MD;  Location: [place] [facility];  Service: Cardiovascular;  Laterality: N/A;   RADIOLOGY WITH ANESTHESIA N/A 10/01/2021   Procedure: IR WITH ANESTHESIA;  Surgeon: Radiologist, Medication, MD;  Location: [place];  Service: Radiology;  Laterality: N/A;    Social History   Socioeconomic History   Marital status: Married    Spouse name: Not on file   Number of children: 3   Years of education: 12   Highest education level: 12th grade  Occupational History   Occupation: Retired  Tobacco Use   Smoking status: Former    Packs/day: 1.50    Years: 50.00    Pack years: 75.00    Types: Cigarettes    Quit date: 09/2021    Years since quitting: 0.3   Smokeless tobacco: Never  Vaping Use   Vaping Use: Never used  Substance and Sexual Activity   Alcohol use: Not Currently   Drug use: Not Currently   Sexual activity: Not Currently  Other Topics Concern   Not on file  Social History Narrative   October 2022 stopped   Social Determinants of Health   Financial Resource Strain: Low Risk    Difficulty of Paying Living Expenses: Not hard at all  Food Insecurity: No Food Insecurity   Worried About Charity fundraiser in the Last Year: Never true   Arboriculturist in the Last Year: Never true  Transportation Needs: No Transportation Needs   Lack of Transportation (Medical): No   Lack of Transportation (Non-Medical): No  Physical  Activity: Insufficiently Active   Days of Exercise per Week: 2 days   Minutes of Exercise per Session: 60 min  Stress: Stress Concern Present   Feeling of Stress : To some extent  Social Connections: Moderately Isolated   Frequency of Communication with Friends and Family: More than three times a week   Frequency of Social Gatherings with Friends and Family: More than three times a week   Attends Religious Services: Never   Marine scientist or Organizations: No   Attends  Music therapist: Never   Marital Status: Married  Human resources officer Violence: Not At Risk   Fear of Current or Ex-Partner: No   Emotionally Abused: No   Physically Abused: No   Sexually Abused: No    Family History  Problem Relation Age of Onset   COPD Mother    Cancer Mother      Current Facility-Administered Medications:    acetaminophen (TYLENOL) tablet 650 mg, 650 mg, Oral, Q6H PRN, 650 mg at 01/11/22 2351 **OR** acetaminophen (TYLENOL) suppository 650 mg, 650 mg, Rectal, Q6H PRN, Dew, Erskine Squibb, MD   ALPRAZolam Duanne Moron) tablet 0.25 mg, 0.25 mg, Oral, QHS PRN, Algernon Huxley, MD, 0.25 mg at 01/12/22 2117   amLODipine (NORVASC) tablet 5 mg, 5 mg, Oral, Daily, Dew, Erskine Squibb, MD, 5 mg at 01/13/22 0859   atorvastatin (LIPITOR) tablet 80 mg, 80 mg, Oral, Daily, Wyvonnia Dusky, MD, 80 mg at 01/13/22 0859   docusate sodium (COLACE) capsule 200 mg, 200 mg, Oral, BID, Wyvonnia Dusky, MD, 200 mg at 01/13/22 9470   ferrous sulfate tablet 325 mg, 325 mg, Oral, Q breakfast, Dew, Erskine Squibb, MD, 325 mg at 96/28/36 6294   folic acid (FOLVITE) tablet 1 mg, 1 mg, Oral, Daily, Dew, Erskine Squibb, MD, 1 mg at 01/13/22 0859   magnesium gluconate (MAGONATE) tablet 250 mg, 250 mg, Oral, QHS, Dew, Erskine Squibb, MD, 250 mg at 01/12/22 2116   metoprolol tartrate (LOPRESSOR) injection 5 mg, 5 mg, Intravenous, Q8H PRN, Wyvonnia Dusky, MD   metoprolol tartrate (LOPRESSOR) tablet 25 mg, 25 mg, Oral, BID, Callwood, Dwayne D, MD, 25 mg at 01/13/22 0859   ondansetron (ZOFRAN) tablet 4 mg, 4 mg, Oral, Q6H PRN **OR** ondansetron (ZOFRAN) injection 4 mg, 4 mg, Intravenous, Q6H PRN, Dew, Erskine Squibb, MD   pantoprazole (PROTONIX) EC tablet 40 mg, 40 mg, Oral, Daily, Benita Gutter, RPH, 40 mg at 01/13/22 0900   ticagrelor (BRILINTA) tablet 90 mg, 90 mg, Oral, BID, Dew, Erskine Squibb, MD, 90 mg at 01/13/22 7654   vitamin B-12 (CYANOCOBALAMIN) tablet 100 mcg, 100 mcg, Oral, Daily, Algernon Huxley, MD, 100 mcg at 01/13/22  0900  Physical exam:  Vitals:   01/12/22 2338 01/13/22 0557 01/13/22 0752 01/13/22 1605  BP: (!) 116/57 (!) 144/74 (!) 145/84 120/66  Pulse: 70 78 73 91  Resp:  18 14 18   Temp: 98.2 F (36.8 C) 99.3 F (37.4 C) 99.2 F (37.3 C) 99.3 F (37.4 C)  TempSrc: Oral   Oral  SpO2: 92% 92% 94% 95%  Weight:      Height:       Physical Exam Constitutional:      General: She is not in acute distress. Cardiovascular:     Rate and Rhythm: Normal rate and regular rhythm.     Heart sounds: Normal heart sounds.  Pulmonary:     Effort: Pulmonary effort is normal.     Breath sounds: Normal breath  sounds.  Abdominal:     General: Bowel sounds are normal.     Palpations: Abdomen is soft.  Skin:    General: Skin is warm and dry.  Neurological:     Mental Status: She is alert and oriented to person, place, and time.     CMP Latest Ref Rng & Units 01/13/2022  Glucose 70 - 99 mg/dL 93  BUN 8 - 23 mg/dL 13  Creatinine 0.44 - 1.00 mg/dL 0.89  Sodium 135 - 145 mmol/L 136  Potassium 3.5 - 5.1 mmol/L 3.8  Chloride 98 - 111 mmol/L 104  CO2 22 - 32 mmol/L 26  Calcium 8.9 - 10.3 mg/dL 9.0  Total Protein 6.5 - 8.1 g/dL -  Total Bilirubin 0.3 - 1.2 mg/dL -  Alkaline Phos 38 - 126 U/L -  AST 15 - 41 U/L -  ALT 0 - 44 U/L -   CBC Latest Ref Rng & Units 01/13/2022  WBC 4.0 - 10.5 K/uL 10.2  Hemoglobin 12.0 - 15.0 g/dL 7.9(L)  Hematocrit 36.0 - 46.0 % 24.2(L)  Platelets 150 - 400 K/uL 480(H)    @IMAGES @  CT CHEST ABDOMEN PELVIS W CONTRAST  Result Date: 01/12/2022 CLINICAL DATA:  Sepsis. EXAM: CT CHEST, ABDOMEN, AND PELVIS WITH CONTRAST TECHNIQUE: Multidetector CT imaging of the chest, abdomen and pelvis was performed following the standard protocol during bolus administration of intravenous contrast. RADIATION DOSE REDUCTION: This exam was performed according to the departmental dose-optimization program which includes automated exposure control, adjustment of the mA and/or kV according to patient  size and/or use of iterative reconstruction technique. CONTRAST:  136mL OMNIPAQUE IOHEXOL 300 MG/ML  SOLN COMPARISON:  Chest radiograph dated 01/05/2022. CT dated 11/13/2021. FINDINGS: CT CHEST FINDINGS Cardiovascular: There is no cardiomegaly or pericardial effusion. There is coronary vascular calcification. Mild atherosclerotic calcification of the thoracic aorta. No aneurysmal dilatation or dissection. The origins of the great vessels of the aortic arch appear patent. There is partially occlusive thrombus in the posterior basal segment of the left lower lobe pulmonary artery branch. No evidence of right heart straining. Mediastinum/Nodes: No hilar or mediastinal adenopathy. Small hilar and mediastinal calcified granuloma. The esophagus and the thyroid gland are grossly unremarkable. No mediastinal fluid collection. Lungs/Pleura: Background of centrilobular emphysema. There are biapical subpleural scarring. No focal consolidation, pleural effusion, or pneumothorax. The central airways are patent. Musculoskeletal: Degenerative changes of the spine. No acute osseous pathology. CT ABDOMEN PELVIS FINDINGS No intra-abdominal free air or free fluid. Hepatobiliary: Fatty liver. No intrahepatic biliary dilatation. The gallbladder is predominantly contracted. No calcified gallstone. Slightly high attenuating content in the gallbladder fundus may represent sludge, noncalcified stone, or focus of adenomyomatosis. No pericholecystic fluid or evidence of acute cholecystitis by CT. Pancreas: Unremarkable. No pancreatic ductal dilatation or surrounding inflammatory changes. Spleen: Normal in size without focal abnormality. Adrenals/Urinary Tract: The adrenal glands unremarkable. Subcentimeter right renal hypodense focus is too small to characterize. There is no hydronephrosis on either side. There is symmetric enhancement and excretion of contrast by both kidneys. The visualized ureters and urinary bladder appear unremarkable.  Stomach/Bowel: No bowel obstruction or active inflammation. The appendix is normal. Vascular/Lymphatic: Moderate aortoiliac atherosclerotic disease. An infrarenal IVC filter is noted. The IVC is unremarkable. No portal venous gas. There is no adenopathy. Reproductive: The uterus is anteverted. A 2.5 cm left uterine fibroid noted. There is a 2.7 cm cyst in the right ovary similar to prior CT. This was better evaluated on ultrasound of 11/13/2021. The left ovary is  unremarkable. Other: Small fat containing umbilical hernia. Musculoskeletal: Degenerative changes of the spine. No acute osseous pathology. IMPRESSION: 1. Partially occlusive left lower lobe posterior basal segmental pulmonary artery embolism. No evidence of right heart straining. 2. No bowel obstruction. Normal appendix. 3. Fatty liver. 4. Aortic Atherosclerosis (ICD10-I70.0) and Emphysema (ICD10-J43.9). These results were called by telephone at the time of interpretation on 01/12/2022 at 8:32 pm to nurse practitioner, Otilio Miu, who verbally acknowledged these results. Electronically Signed   By: Anner Crete M.D.   On: 01/12/2022 20:37   PERIPHERAL VASCULAR CATHETERIZATION  Result Date: 01/06/2022 See surgical note for result.  DG Chest Port 1 View  Result Date: 01/05/2022 CLINICAL DATA:  Weakness and lightheadedness. EXAM: PORTABLE CHEST 1 VIEW COMPARISON:  CT of the chest on 11/13/2021 FINDINGS: The heart size and mediastinal contours are within normal limits. Mild pulmonary interstitial prominence without overt edema, pleural fluid or pneumothorax. Mild underlying pulmonary interstitial edema cannot be excluded. There is atelectasis at both lung bases, right greater than left. The visualized skeletal structures are unremarkable. IMPRESSION: Mild pulmonary interstitial prominence may represent mild underlying pulmonary interstitial edema. Bibasilar atelectasis, right greater than left. Electronically Signed   By: Aletta Edouard M.D.   On:  01/05/2022 09:07   ECHOCARDIOGRAM COMPLETE  Result Date: 01/10/2022    ECHOCARDIOGRAM REPORT   Patient Name:   Kaisyn JIMMIE DATTILIO Date of Exam: 01/10/2022 Medical Rec #:  [id number]  Height:       62.0 in Accession #:    [id number] Weight:       161.6 lb Date of Birth:  1951/05/21  BSA:          1.746 m Patient Age:    72 years   BP:           145/75 mmHg Patient Gender: F          HR:           66 bpm. Exam Location:  [facility] Procedure: 2D Echo Indications:     Fever R50.9  History:         Patient has prior history of Echocardiogram examinations, most                  recent 10/02/2021.  Sonographer:     Kathlen Brunswick RDCS Referring Phys:  [id number] Wyvonnia Dusky Diagnosing Phys: Yolonda Kida MD IMPRESSIONS  1. Left ventricular ejection fraction, by estimation, is 65 to 70%. The left ventricle has normal function. The left ventricle has no regional wall motion abnormalities. Left ventricular diastolic parameters are consistent with Grade I diastolic dysfunction (impaired relaxation).  2. Right ventricular systolic function is normal. The right ventricular size is normal.  3. The mitral valve is grossly normal. No evidence of mitral valve regurgitation.  4. The aortic valve is normal in structure. Aortic valve regurgitation is not visualized. FINDINGS  Left Ventricle: Left ventricular ejection fraction, by estimation, is 65 to 70%. The left ventricle has normal function. The left ventricle has no regional wall motion abnormalities. The left ventricular internal cavity size was normal in size. There is  no left ventricular hypertrophy. Left ventricular diastolic parameters are consistent with Grade I diastolic dysfunction (impaired relaxation). Right Ventricle: The right ventricular size is normal. No increase in right ventricular wall thickness. Right ventricular systolic function is normal. Left Atrium: Left atrial size was normal in size. Right Atrium: Right atrial size was normal in size. Pericardium: There is  no evidence of pericardial effusion. Mitral  Valve: The mitral valve is grossly normal. No evidence of mitral valve regurgitation. Tricuspid Valve: The tricuspid valve is normal in structure. Tricuspid valve regurgitation is not demonstrated. Aortic Valve: The aortic valve is normal in structure. Aortic valve regurgitation is not visualized. Aortic valve peak gradient measures 10.1 mmHg. Pulmonic Valve: The pulmonic valve was normal in structure. Pulmonic valve regurgitation is not visualized. Aorta: The ascending aorta was not well visualized. IAS/Shunts: No atrial level shunt detected by color flow Doppler.  LEFT VENTRICLE PLAX 2D LVIDd:         4.60 cm     Diastology LVIDs:         2.90 cm     LV e' medial:    5.22 cm/s LV PW:         1.40 cm     LV E/e' medial:  12.5 LV IVS:        1.30 cm     LV e' lateral:   3.81 cm/s LVOT diam:     1.80 cm     LV E/e' lateral: 17.1 LV SV:         58 LV SV Index:   33 LVOT Area:     2.54 cm  LV Volumes (MOD) LV vol d, MOD A4C: 64.6 ml LV vol s, MOD A4C: 26.2 ml LV SV MOD A4C:     64.6 ml RIGHT VENTRICLE RV Basal diam:  3.30 cm RV S prime:     19.90 cm/s TAPSE (M-mode): 2.2 cm LEFT ATRIUM             Index        RIGHT ATRIUM           Index LA diam:        2.40 cm 1.37 cm/m   RA Area:     17.40 cm LA Vol (A2C):   33.9 ml 19.41 ml/m  RA Volume:   52.30 ml  29.95 ml/m LA Vol (A4C):   42.1 ml 24.11 ml/m LA Biplane Vol: 40.5 ml 23.19 ml/m  AORTIC VALVE                 PULMONIC VALVE AV Area (Vmax): 1.78 cm     PV Vmax:       1.14 m/s AV Vmax:        159.00 cm/s  PV Peak grad:  5.2 mmHg AV Peak Grad:   10.1 mmHg LVOT Vmax:      111.00 cm/s LVOT Vmean:     70.000 cm/s LVOT VTI:       0.229 m  AORTA Ao Root diam: 2.90 cm Ao Asc diam:  3.20 cm MITRAL VALVE               TRICUSPID VALVE MV Area (PHT): 3.46 cm    TV Peak grad:   22.8 mmHg MV Decel Time: 219 msec    TV Vmax:        2.39 m/s MV E velocity: 65.10 cm/s MV A velocity: 81.80 cm/s  SHUNTS MV E/A ratio:  0.80         Systemic VTI:  0.23 m                            Systemic Diam: 1.80 cm Dwayne Prince Rome MD Electronically signed by Yolonda Kida MD Signature Date/Time: 01/10/2022/2:25:19 PM    Final    LONG TERM MONITOR (3-14 DAYS)  Result  Date: 12/28/2021 Patch Wear Time:  13 days and 13 hours (2023-01-20T15:50:58-0500 to 2023-02-03T05:28:36-0500) Patient had a min HR of 42 bpm, max HR of 164 bpm, and avg HR of 61 bpm. Predominant underlying rhythm was Sinus Rhythm. 1 run of Ventricular Tachycardia occurred lasting 12 beats with a max rate of 164 bpm (avg 120 bpm). 26 Supraventricular Tachycardia runs occurred, the run with the fastest interval lasting 6 beats with a max rate of 154 bpm, the longest lasting 28.3 secs with an avg rate of 135 bpm. Some episodes of Supraventricular Tachycardia may be possible Atrial Tachycardia with variable block. Isolated SVEs were rare (<1.0%), SVE Couplets were rare (<1.0%), and SVE Triplets were rare (<1.0%). Isolated VEs were rare (<1.0%), VE Couplets were rare (<1.0%), and no VE Triplets were present. Ventricular Bigeminy and Trigeminy were present. Paroxysmal SVT and VT noted.  No evidence for atrial fibrillation.    Assessment and plan- Patient is a 71 y.o. 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  Reticulocyte count was low, haptoglobin normal and bilirubin normal and therefore patient does not have any active hemolysis.  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  PNH work-up was negative.   Antiphospholipid antibody work-up negative.  Given the drop in her hemoglobin and nosebleeds we are held the Eliquis and IVC filter was placed.  However CT chest done yesterday shows a new segmental pulmonary embolism and therefore recommend restarting anticoagulation with heparin to begin with and see if she has recurrence of nosebleeds.  Unfortunately patient is at a high risk of propagation of PE with the absence of anticoagulation and therefore it will need to be continued at this time.  Patient is also on Brilinta for history of stroke in November 2022.  I have communicated all these findings with Dr. Si Raider  Fevers: Etiology unclear ID was on board and patient was on IV antibiotics.  Fever curve is overall coming down.  Defer to primary team  I will follow the patient upon discharge as an outpatient   Visit Diagnosis 1. AKI (acute kidney injury) (HCC)   2. Weak   3. Anemia, unspecified type   4. Anticoagulated   5. Fever      Dr. Randa Evens, MD, MPH [facility] at [facility] [phone] 01/13/2022 4:09 PM

## 2022-01-13 NOTE — Progress Notes (Signed)
Brief ID note: ? ?Patient underwent CT chest, abdomen, pelvis yesterday afternoon.  This was most notable for a partially occlusive left lower lobe posterior basal segmental pulmonary artery embolism.  Patient was evaluated overnight by the hospitalist service and starting anticoagulation was deferred at this time given she was asymptomatic on 3 L via nasal cannula.  However, per notes today it appears that she will be restarted on IV heparin with possible plan to transition to Eliquis in the next 24 to 48 hours if she tolerates this well.  Fever curve appears to be improving with stable WBC of 10.2 when checked this morning.  Her last documented fever was on 2/27 with a Tmax of 100.5 ?F.  She has now received 6 days of ceftriaxone and metronidazole for empiric antimicrobial coverage.  Will stop antibiotics today as it is possible that her fevers were secondary to pulmonary embolism, cultures have all been negative, and she has received over 5 days of empiric therapy.  Will sign off for now, please call as needed. ? ? ?Mignon Pine ?[place] [facility] ?[facility] ?01/13/2022, 3:25 PM ? ?

## 2022-01-13 NOTE — Consult Note (Signed)
ANTICOAGULATION CONSULT NOTE ? ?Pharmacy Consult for IV Heparin ?Indication: pulmonary embolus ? ?Patient Measurements: ?Height: 5\' 2"  (157.5 cm) ?Weight: 73.3 kg (161 lb 9.6 oz) ?IBW/kg (Calculated) : 50.1 ?Heparin Dosing Weight: 65.8 ? ?Labs: ?Recent Labs  ?  01/11/22 ?0411 01/12/22 ?6283 01/13/22 ?0501  ?HGB 7.9* 7.7* 7.9*  ?HCT 24.6* 23.3* 24.2*  ?PLT 453* 474* 480*  ?CREATININE 1.01* 0.95 0.89  ? ? ?Estimated Creatinine Clearance: 55.2 mL/min (by C-G formula based on SCr of 0.89 mg/dL). ? ? ?Medications:  ?Apixaban 5 mg BID prior to admission ?Ticagrelor 90 mg BID continued from home medications while admitted ? 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 Patient further remained admitted for fevers of unknown origin and both ID and hematology / oncology following. Apixaban has remained on hold for the admission and an IVC filter was placed. Patient was ultimately found to have a PE on CT scan 2/28 which may represent source of fevers. Pharmacy now consulted to initiate heparin infusion for PE treatment. ? ?CBC has been overall stable with Hgb between 7 - 8 g/dL. Mild thrombocytosis. Baseline aPTT, PT-INR, and heparin level are pending.  ? ?Goal of Therapy:  ?Heparin level 0.3-0.7 units/ml ?Monitor platelets by anticoagulation protocol: Yes ?  ?Plan:  ?--Heparin 4000 unit IV bolus followed by continuous infusion at 1000 units/hr ?--Will check HL 8 hours after initiation of infusion; anticipate will be able to follow HL as apixaban has been held since admission ?--Daily CBC per protocol while on IV heparin ?--Tentative plan is to transition back to  apixaban in 24-48 hours if patient tolerates anticoagulation ? ?Andrea Williamson ?01/13/2022,5:09 PM ? ? ?

## 2022-01-13 NOTE — Progress Notes (Addendum)
Paged Sharion Settler about patient's IV infiltrating at change of shift when Heparin bolus was being given. Asked if I should start heparin original order of bolus 4000 units with continuous heparin infusing at 43ml/hr. Awaiting advisement. Patient now has IV started by IV team.  ? ?Pharmacy called prior to starting heparin. Phamacist says its ok to start the bolus as ordered as well as the continunous Heparin at 57ml/hr.  NP referred advisement from pharmacy.  ?Heparin given as ordered. Dual sign off in room.  ?

## 2022-01-14 ENCOUNTER — Encounter: Payer: Self-pay | Admitting: Hematology and Oncology

## 2022-01-14 ENCOUNTER — Inpatient Hospital Stay: Payer: Medicare Other | Attending: Oncology

## 2022-01-14 ENCOUNTER — Ambulatory Visit: Payer: Medicare Other

## 2022-01-14 ENCOUNTER — Inpatient Hospital Stay: Payer: Medicare Other | Admitting: Oncology

## 2022-01-14 DIAGNOSIS — K760 Fatty (change of) liver, not elsewhere classified: Secondary | ICD-10-CM | POA: Insufficient documentation

## 2022-01-14 DIAGNOSIS — Z809 Family history of malignant neoplasm, unspecified: Secondary | ICD-10-CM | POA: Insufficient documentation

## 2022-01-14 DIAGNOSIS — Z86711 Personal history of pulmonary embolism: Secondary | ICD-10-CM | POA: Insufficient documentation

## 2022-01-14 DIAGNOSIS — R531 Weakness: Secondary | ICD-10-CM | POA: Insufficient documentation

## 2022-01-14 DIAGNOSIS — R42 Dizziness and giddiness: Secondary | ICD-10-CM | POA: Insufficient documentation

## 2022-01-14 DIAGNOSIS — I2699 Other pulmonary embolism without acute cor pulmonale: Secondary | ICD-10-CM | POA: Insufficient documentation

## 2022-01-14 DIAGNOSIS — N83201 Unspecified ovarian cyst, right side: Secondary | ICD-10-CM | POA: Insufficient documentation

## 2022-01-14 DIAGNOSIS — R110 Nausea: Secondary | ICD-10-CM | POA: Insufficient documentation

## 2022-01-14 DIAGNOSIS — J432 Centrilobular emphysema: Secondary | ICD-10-CM | POA: Insufficient documentation

## 2022-01-14 DIAGNOSIS — E785 Hyperlipidemia, unspecified: Secondary | ICD-10-CM | POA: Insufficient documentation

## 2022-01-14 DIAGNOSIS — K429 Umbilical hernia without obstruction or gangrene: Secondary | ICD-10-CM | POA: Insufficient documentation

## 2022-01-14 DIAGNOSIS — D5911 Warm autoimmune hemolytic anemia: Secondary | ICD-10-CM | POA: Insufficient documentation

## 2022-01-14 DIAGNOSIS — I82403 Acute embolism and thrombosis of unspecified deep veins of lower extremity, bilateral: Secondary | ICD-10-CM | POA: Insufficient documentation

## 2022-01-14 DIAGNOSIS — Z8673 Personal history of transient ischemic attack (TIA), and cerebral infarction without residual deficits: Secondary | ICD-10-CM | POA: Insufficient documentation

## 2022-01-14 DIAGNOSIS — Z7901 Long term (current) use of anticoagulants: Secondary | ICD-10-CM | POA: Insufficient documentation

## 2022-01-14 DIAGNOSIS — I10 Essential (primary) hypertension: Secondary | ICD-10-CM | POA: Insufficient documentation

## 2022-01-14 DIAGNOSIS — Z86718 Personal history of other venous thrombosis and embolism: Secondary | ICD-10-CM | POA: Insufficient documentation

## 2022-01-14 DIAGNOSIS — D259 Leiomyoma of uterus, unspecified: Secondary | ICD-10-CM | POA: Insufficient documentation

## 2022-01-14 DIAGNOSIS — I472 Ventricular tachycardia, unspecified: Secondary | ICD-10-CM | POA: Insufficient documentation

## 2022-01-14 DIAGNOSIS — Z836 Family history of other diseases of the respiratory system: Secondary | ICD-10-CM | POA: Insufficient documentation

## 2022-01-14 DIAGNOSIS — I471 Supraventricular tachycardia: Secondary | ICD-10-CM | POA: Insufficient documentation

## 2022-01-14 DIAGNOSIS — Z79899 Other long term (current) drug therapy: Secondary | ICD-10-CM | POA: Insufficient documentation

## 2022-01-14 LAB — CBC
HCT: 24.5 % — ABNORMAL LOW (ref 36.0–46.0)
Hemoglobin: 7.9 g/dL — ABNORMAL LOW (ref 12.0–15.0)
MCH: 33.3 pg (ref 26.0–34.0)
MCHC: 32.2 g/dL (ref 30.0–36.0)
MCV: 103.4 fL — ABNORMAL HIGH (ref 80.0–100.0)
Platelets: 494 10*3/uL — ABNORMAL HIGH (ref 150–400)
RBC: 2.37 MIL/uL — ABNORMAL LOW (ref 3.87–5.11)
RDW: 15.5 % (ref 11.5–15.5)
WBC: 9.9 10*3/uL (ref 4.0–10.5)
nRBC: 1 % — ABNORMAL HIGH (ref 0.0–0.2)

## 2022-01-14 LAB — HEPARIN LEVEL (UNFRACTIONATED)
Heparin Unfractionated: 0.46 IU/mL (ref 0.30–0.70)
Heparin Unfractionated: 0.22 IU/mL — ABNORMAL LOW (ref 0.30–0.70)

## 2022-01-14 MED ORDER — HEPARIN BOLUS VIA INFUSION
1000.0000 [IU] | Freq: Once | INTRAVENOUS | Status: AC
  Administered 2022-01-14: 1000 [IU] via INTRAVENOUS
  Filled 2022-01-14: qty 1000

## 2022-01-14 MED ORDER — APIXABAN 5 MG PO TABS
5.0000 mg | ORAL_TABLET | Freq: Two times a day (BID) | ORAL | Status: DC
  Administered 2022-01-14 – 2022-01-15 (×3): 5 mg via ORAL
  Filled 2022-01-14 (×3): qty 1

## 2022-01-14 NOTE — Progress Notes (Signed)
PROGRESS NOTE   HPI was taken from Dr. Francine Graven: Andrea Williamson is a 71 y.o. 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  Patient states that she had some epistaxis at home and it only happened when she blew her nose but that has resolved.  She is unable to tell me if she has had any bloody stools or melena but per the ER provider  She had dark stools. She denies having any hematemesis, no cough, no abdominal pain, no chest pain, no shortness of breath, no nausea, no vomiting, no headache.  She has no urinary symptoms She did have a fever in the ER with a Tmax of 102.5. Review of Systems: As mentioned in the history of present illness. All other systems reviewed and are negative.   As per Dr. Jimmye Norman 2/22-2/28/23: Pt presented w/ symptomatic and acute blood loss anemia of unknown etiology. 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 No hemolysis as per heme/onco. Pt has not required a blood transfusion so far this admission. Of note, pt has continued to spike fevers of unknown etiology daily. ID was consulted and at first recommended to no abxs but then placed the pt on abxs. Pt still continues to spike fevers despite abxs. ID also ordered an autoimmune work-up which has all been neg except for low IgG. Low IgG was secondary to pt receiving rituxan in Jan 2023 for hemolytic anemia given by heme/onco. Parovirus is pending still. Also, pt has hx of DVT and was on eliquis. Eliquis has been held this admission secondary to acute blood loss anemia. Pt had IVC filter placed by vasc surg this admission as per heme/onco. Pt will  continue on home dose of brilinta however.     Andrea Williamson  IPJ:[id number] DOB: 07/08/1951 DOA: 01/05/2022 PCP: [name], MD   Assessment & Plan:   Principal Problem:   Symptomatic anemia Active Problems:   History of stroke   Essential hypertension   Warm autoimmune hemolytic anemia (HCC)   DVT (deep venous thrombosis) (HCC)   Fever   Anemia: recent hx warm hemolytic anemia but no signs of hemolysis. Hgb on admission around 8 and has stayed steady there, from 11.7 few weeks ago. GI does not think GI bleed. Heme following.   Fever:  etiology unclear, continues to spike fevers despite abxs, though last fever was now > 44 hours ago. Blood cxs NGTD & repeat blood cxs NGTD. CXR shows pulmonary interstitial edema no pneumonia. COVID19, influenza are both neg. Treated with flagyl and ceftriaxone, now discontinued. ID has followed, now signed off, thinks fever could be 2/2 PE.  Previous provider curbsided rheum, Dr. Jefm Bryant, who thinks it is unlikely pt has autoimmune/rheum issue.   SVT: s/p IV lopressor x 1 on 01/08/22. Back in sinus rhythm. Continue on tele. IV lopressor prn. Echo shows EF 73-41%, grade I diastolic dysfunction & no atrial level shunt detected   DVT, PE: s/p IVC filter placed on 01/06/22 by vasc surg. Now with new segmental PE seen on 2/28. Discussed w/ onc, we will start heparin, plan to transition to eluis in 24-48 hours assuming tolerates. Will communicate w/ heme about starting that  in the next 24 hours. TTE from a few days ago no right heart strain and no signs of decompensation today so holding on repeat.   Hypoxic respiratory failure: likely 2/2 atelectasis, PE. Continuing [place] O2, wean as able. IS ordered. May need o2 at discharge  Hx of warm autoimmune hemolytic anemia: was on rituxan w/ last dose give 12/09/21. No hemolysis as per heme/onco   HTN: continue on amlodipine    Hx of CVA: due to left M2 occlusion status post thrombectomy with revascularization and left  A2 stenting. Continuing brillinta  Adnexal mass: will need outpt gyn/onc f/u    DVT prophylaxis:  heparin Code Status: full  Family Communication: discussed pt's care w/ pt's husband at bedside and answered their questions  Disposition Plan: home. PT consulted, no HH needs   Level of care: Med-Surg  Status is: Inpatient Remains inpatient appropriate because: severity of illness     Consultants:  Heme/onc  GI  ID Cardio   Procedures:   Antimicrobials:   Subjective: Pt c/o being frustrated. No pain. No bleeding last 48 hours from nose or elsewhere.   Objective: Vitals:   01/13/22 2010 01/14/22 0405 01/14/22 0734 01/14/22 0920  BP: (!) 114/57 129/66 124/84 137/81  Pulse: 83 82 71 72  Resp: 17 19 16    Temp: 99.7 F (37.6 C) 97.8 F (36.6 C) 99 F (37.2 C)   TempSrc:   Oral   SpO2: 94% 96% 90%   Weight:      Height:        Intake/Output Summary (Last 24 hours) at 01/14/2022 1427 Last data filed at 01/14/2022 0000 Gross per 24 hour  Intake 80.57 ml  Output --  Net 80.57 ml    Filed Weights   01/04/22 1830  Weight: 73.3 kg    Examination:  General exam: Appears frustrated Respiratory system: clear breath sounds b/l  Cardiovascular system: S1 & S2+. No rubs or clicks  Gastrointestinal system: Abd is soft, NT, ND & normal bowel sounds  Central nervous system: awake and alert. Moves all extremities  Psychiatry: judgement and insight appear normal. Agitated/frustrated mood and affect    Data Reviewed: I have personally reviewed following labs and imaging studies  CBC: Recent Labs  Lab 01/10/22 0358 01/11/22 0411 01/12/22 0914 01/13/22 0501 01/14/22 0155  WBC 9.4 9.9 9.7 10.2 9.9  NEUTROABS  --   --  7.2  --   --   HGB 7.4* 7.9* 7.7* 7.9* 7.9*  HCT 22.8* 24.6* 23.3* 24.2* 24.5*  MCV 104.6* 104.7* 102.2* 102.5* 103.4*  PLT 416* 453* 474* 480* 782*   Basic Metabolic Panel: Recent Labs  Lab 01/09/22 0633 01/10/22 0358 01/11/22 0411  01/12/22 0350 01/12/22 0914 01/13/22 0501  NA 135 134* 137  --  134* 136  K 3.3* 3.2* 3.5  --  3.4* 3.8  CL 102 102 102  --  102 104  CO2 20* 22 23  --  24 26  GLUCOSE 101* 113* 66*  --  128* 93  BUN 22 19 20   --  17 13  CREATININE 1.01* 1.04* 1.01*  --  0.95 0.89  CALCIUM 8.8* 8.8* 9.1  --  8.8* 9.0  MG  --   --  1.8 1.7  --  1.9   GFR: Estimated Creatinine Clearance: 55.2 mL/min (by C-G formula based on SCr of 0.89 mg/dL). Liver Function Tests: Recent Labs  Lab 01/08/22 1412 01/12/22 0914  AST 48* 32  ALT 42 32  ALKPHOS  62 59  BILITOT 0.8 0.5  PROT 6.9 6.1*  ALBUMIN 2.8* 2.6*   No results for input(s): LIPASE, AMYLASE in the last 168 hours. No results for input(s): AMMONIA in the last 168 hours. Coagulation Profile: Recent Labs  Lab 01/13/22 1722  INR 1.1    Cardiac Enzymes: No results for input(s): CKTOTAL, CKMB, CKMBINDEX, TROPONINI in the last 168 hours. BNP (last 3 results) No results for input(s): PROBNP in the last 8760 hours. HbA1C: No results for input(s): HGBA1C in the last 72 hours. CBG: No results for input(s): GLUCAP in the last 168 hours.  Lipid Profile: No results for input(s): CHOL, HDL, LDLCALC, TRIG, CHOLHDL, LDLDIRECT in the last 72 hours. Thyroid Function Tests: No results for input(s): TSH, T4TOTAL, FREET4, T3FREE, THYROIDAB in the last 72 hours.  Anemia Panel: No results for input(s): VITAMINB12, FOLATE, FERRITIN, TIBC, IRON, RETICCTPCT in the last 72 hours.  Sepsis Labs: Recent Labs  Lab 01/08/22 1412  PROCALCITON 0.20    Recent Results (from the past 240 hour(s))  Resp Panel by RT-PCR (Flu A&B, Covid) Nasopharyngeal Swab     Status: None   Collection Time: 01/05/22  3:53 AM   Specimen: Nasopharyngeal Swab; Nasopharyngeal(NP) swabs in vial transport medium  Result Value Ref Range Status   SARS Coronavirus 2 by RT PCR NEGATIVE NEGATIVE Final    Comment: (NOTE) SARS-CoV-2 target nucleic acids are NOT DETECTED.  The SARS-CoV-2  RNA is generally detectable in upper respiratory specimens during the acute phase of infection. The lowest concentration of SARS-CoV-2 viral copies this assay can detect is 138 copies/mL. A negative result does not preclude SARS-Cov-2 infection and should not be used as the sole basis for treatment or other patient management decisions. A negative result may occur with  improper specimen collection/handling, submission of specimen other than nasopharyngeal swab, presence of viral mutation(s) within the areas targeted by this assay, and inadequate number of viral copies(<138 copies/mL). A negative result must be combined with clinical observations, patient history, and epidemiological information. The expected result is Negative.  Fact Sheet for Patients:  EntrepreneurPulse.com.au  Fact Sheet for Healthcare Providers:  IncredibleEmployment.be  This test is no t yet approved or cleared by the [place] FDA and  has been authorized for detection and/or diagnosis of SARS-CoV-2 by FDA under an Emergency Use Authorization (EUA). This EUA will remain  in effect (meaning this test can be used) for the duration of the COVID-19 declaration under Section 564(b)(1) of the Act, 21 U.S.C.section 360bbb-3(b)(1), unless the authorization is terminated  or revoked sooner.       Influenza A by PCR NEGATIVE NEGATIVE Final   Influenza B by PCR NEGATIVE NEGATIVE Final    Comment: (NOTE) The Xpert Xpress SARS-CoV-2/FLU/RSV plus assay is intended as an aid in the diagnosis of influenza from Nasopharyngeal swab specimens and should not be used as a sole basis for treatment. Nasal washings and aspirates are unacceptable for Xpert Xpress SARS-CoV-2/FLU/RSV testing.  Fact Sheet for Patients: EntrepreneurPulse.com.au  Fact Sheet for Healthcare Providers: IncredibleEmployment.be  This test is not yet approved or cleared by the  [place] FDA and has been authorized for detection and/or diagnosis of SARS-CoV-2 by FDA under an Emergency Use Authorization (EUA). This EUA will remain in effect (meaning this test can be used) for the duration of the COVID-19 declaration under Section 564(b)(1) of the Act, 21 U.S.C. section 360bbb-3(b)(1), unless the authorization is terminated or revoked.  Performed at [facility], [address] [place],  [place] [address]   CULTURE, BLOOD (ROUTINE X 2) w Reflex to ID Panel     Status: None   Collection Time: 01/05/22  9:26 AM   Specimen: BLOOD  Result Value Ref Range Status   Specimen Description BLOOD RIGHT ANTECUBITAL  Final   Special Requests   Final    Blood Culture results may not be optimal due to an excessive volume of blood received in culture bottles   Culture   Final    NO GROWTH 5 DAYS Performed at [facility] : [facility], [address] [place], [place] [address]    Report Status 01/10/2022 FINAL  Final  CULTURE, BLOOD (ROUTINE X 2) w Reflex to ID Panel     Status: None   Collection Time: 01/05/22  9:26 AM   Specimen: BLOOD  Result Value Ref Range Status   Specimen Description BLOOD BLOOD RIGHT HAND  Final   Special Requests   Final    BOTTLES DRAWN AEROBIC AND ANAEROBIC Blood Culture results may not be optimal due to an excessive volume of blood received in culture bottles   Culture   Final    NO GROWTH 5 DAYS Performed at [facility], [address] [place], [place] [address]    Report Status 01/10/2022 FINAL  Final  CULTURE, BLOOD (ROUTINE X 2) w Reflex to ID Panel     Status: None   Collection Time: 01/08/22  2:12 PM   Specimen: BLOOD  Result Value Ref Range Status   Specimen Description BLOOD RIGHT ANTECUBITAL  Final   Special Requests   Final    BOTTLES DRAWN AEROBIC AND ANAEROBIC Blood Culture adequate volume   Culture   Final    NO GROWTH 5 DAYS Performed at [facility], [address] [place], [place]  [address]    Report Status 01/13/2022 FINAL  Final  CULTURE, BLOOD (ROUTINE X 2) w Reflex to ID Panel     Status: None   Collection Time: 01/08/22  2:14 PM   Specimen: BLOOD  Result Value Ref Range Status   Specimen Description BLOOD BLOOD RIGHT HAND  Final   Special Requests   Final    BOTTLES DRAWN AEROBIC AND ANAEROBIC Blood Culture results may not be optimal due to an inadequate volume of blood received in culture bottles   Culture   Final    NO GROWTH 5 DAYS Performed at [facility], [address], [place], [place] [address]    Report Status 01/13/2022 FINAL  Final         Radiology Studies: CT CHEST ABDOMEN PELVIS W CONTRAST  Result Date: 01/12/2022 CLINICAL DATA:  Sepsis. EXAM: CT CHEST, ABDOMEN, AND PELVIS WITH CONTRAST TECHNIQUE: Multidetector CT imaging of the chest, abdomen and pelvis was performed following the standard protocol during bolus administration of intravenous contrast. RADIATION DOSE REDUCTION: This exam was performed according to the departmental dose-optimization program which includes automated exposure control, adjustment of the mA and/or kV according to patient size and/or use of iterative reconstruction technique. CONTRAST:  128mL OMNIPAQUE IOHEXOL 300 MG/ML  SOLN COMPARISON:  Chest radiograph dated 01/05/2022. CT dated 11/13/2021. FINDINGS: CT CHEST FINDINGS Cardiovascular: There is no cardiomegaly or pericardial effusion. There is coronary vascular calcification. Mild atherosclerotic calcification of the thoracic aorta. No aneurysmal dilatation or dissection. The origins of the great vessels of the aortic arch appear patent. There is partially occlusive thrombus in the posterior basal segment of the left lower lobe pulmonary artery branch. No evidence of right  heart straining. Mediastinum/Nodes: No hilar or mediastinal adenopathy. Small hilar and mediastinal calcified granuloma. The esophagus and the thyroid gland are grossly unremarkable. No  mediastinal fluid collection. Lungs/Pleura: Background of centrilobular emphysema. There are biapical subpleural scarring. No focal consolidation, pleural effusion, or pneumothorax. The central airways are patent. Musculoskeletal: Degenerative changes of the spine. No acute osseous pathology. CT ABDOMEN PELVIS FINDINGS No intra-abdominal free air or free fluid. Hepatobiliary: Fatty liver. No intrahepatic biliary dilatation. The gallbladder is predominantly contracted. No calcified gallstone. Slightly high attenuating content in the gallbladder fundus may represent sludge, noncalcified stone, or focus of adenomyomatosis. No pericholecystic fluid or evidence of acute cholecystitis by CT. Pancreas: Unremarkable. No pancreatic ductal dilatation or surrounding inflammatory changes. Spleen: Normal in size without focal abnormality. Adrenals/Urinary Tract: The adrenal glands unremarkable. Subcentimeter right renal hypodense focus is too small to characterize. There is no hydronephrosis on either side. There is symmetric enhancement and excretion of contrast by both kidneys. The visualized ureters and urinary bladder appear unremarkable. Stomach/Bowel: No bowel obstruction or active inflammation. The appendix is normal. Vascular/Lymphatic: Moderate aortoiliac atherosclerotic disease. An infrarenal IVC filter is noted. The IVC is unremarkable. No portal venous gas. There is no adenopathy. Reproductive: The uterus is anteverted. A 2.5 cm left uterine fibroid noted. There is a 2.7 cm cyst in the right ovary similar to prior CT. This was better evaluated on ultrasound of 11/13/2021. The left ovary is unremarkable. Other: Small fat containing umbilical hernia. Musculoskeletal: Degenerative changes of the spine. No acute osseous pathology. IMPRESSION: 1. Partially occlusive left lower lobe posterior basal segmental pulmonary artery embolism. No evidence of right heart straining. 2. No bowel obstruction. Normal appendix. 3. Fatty  liver. 4. Aortic Atherosclerosis (ICD10-I70.0) and Emphysema (ICD10-J43.9). These results were called by telephone at the time of interpretation on 01/12/2022 at 8:32 pm to nurse practitioner, Otilio Miu, who verbally acknowledged these results. Electronically Signed   By: Anner Crete M.D.   On: 01/12/2022 20:37        Scheduled Meds:  amLODipine  5 mg Oral Daily   atorvastatin  80 mg Oral Daily   docusate sodium  200 mg Oral BID   ferrous sulfate  325 mg Oral Q breakfast   folic acid  1 mg Oral Daily   magnesium gluconate  250 mg Oral QHS   metoprolol tartrate  25 mg Oral BID   pantoprazole  40 mg Oral Daily   ticagrelor  90 mg Oral BID   vitamin B-12  100 mcg Oral Daily   Continuous Infusions:  heparin 1,150 Units/hr (01/14/22 0957)     LOS: 9 days    Time spent: 30 mins     Desma Maxim, MD [facility]  If 7PM-7AM, please contact night-coverage 01/14/2022, 2:27 PM

## 2022-01-14 NOTE — Evaluation (Signed)
Physical Therapy Evaluation ?Patient Details ?Name: Andrea Williamson ?MRN: [id number] ?DOB: [birth year] ?Today's Date: 01/14/2022 ? ?History of Present Illness ? Patient is a 71 year old female who reports to [facility] for symptomatic anemia. 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 ?  ?Clinical Impression ? Physical Therapy Evaluation completed on this date. Upon arrival into room patient was supine in bed with significant other present. Patient reported no pain throughout session. Entire session was completed on 3L O2 via [place]. Attempted static sitting EOB on room air, however patients O2 decreased to 81%, O2 via [place] was replaced. Per chart review from previous admission, and patient, home set up and PLOF remain the same.  ? ?Patient was able to demonstrate generalized weakness in BLEs (4/5 strength). Bed mobility was completed at Mod I with increased effort, however no physical assistance was required. Sit to stand transfers from EOB at standard height was completed Mod I with RW and without RW. Mild unsteadiness in standing due to patient's impulsiveness, however patient was able to ambulate forward/backwards steps between EOB and counter at supervision. Per patient's significant other balance is slightly decreased, however near baseline. Patient is demonstrating at baseline/ near baseline level of function, and does not need additional skilled physical therapy at this time. If there is a change in status please reach out. Signing off.   ?   ? ?Recommendations for follow up therapy are one component of a multi-disciplinary discharge planning process, led by the attending physician.  Recommendations may be updated based on patient status, additional functional criteria and insurance authorization. ? ?Follow Up Recommendations No PT follow up ? ?  ?Assistance  Recommended at Discharge PRN  ?Patient can return home with the following ? Assist for transportation ? ?  ?Equipment Recommendations None recommended by PT  ?Recommendations for Other Services ?    ?  ?Functional Status Assessment Patient has had a recent decline in their functional status and demonstrates the ability to make significant improvements in function in a reasonable and predictable amount of time. (per husband, patient's balanace is slighlty decreased, however near baseline)  ? ?  ?Precautions / Restrictions Precautions ?Precautions: Fall ?Restrictions ?Weight Bearing Restrictions: No  ? ?  ? ?Mobility ? Bed Mobility ?Overal bed mobility: Modified Independent ?  ?  ?  ?  ?  ?  ?General bed mobility comments: gets to sitting EOB w/o assist, however uses BUE on R hand rails to pull up into sitting, increased effort required ?  ? ?Transfers ?Overall transfer level: Modified independent ?Equipment used: Rolling walker (2 wheels), None ?  ?  ?  ?  ?  ?  ?  ?General transfer comment: Pt able to rise w/o assist, minimal UE reliance, did stabilize on walker but was not overly reliant on it, attempted sit to stand transfer without AD, patient was able to complete Mod I as well, patient was frustrated about use of O2, removed O2 to see if SpO2 remained above 90%- patient destated to 81, and O2 was placed back on patient ?  ? ?Ambulation/Gait ?Ambulation/Gait assistance: Supervision ?Gait Distance (Feet): 10 Feet (aruond the room) ?Assistive device: None ?  ?Gait velocity: decreased ?  ?  ?General Gait Details: patient impulsive throughout session, cueing for safety, ambulated between the bed and the counter with intermittent tactile cueing on counter for stability, mild unsteadiness,  however no LOB ? ?Stairs ?  ?  ?  ?  ?  ? ?Wheelchair Mobility ?  ? ?Modified Rankin (Stroke Patients Only) ?  ? ?  ? ?Balance Overall balance assessment: Needs assistance ?Sitting-balance support: No upper extremity supported, Feet  supported ?Sitting balance-Leahy Scale: Normal ?  ?  ?Standing balance support: No upper extremity supported, During functional activity ?Standing balance-Leahy Scale: Good ?  ?  ?  ?  ?  ?  ?  ?  ?  ?  ?  ?  ?   ? ? ? ?Pertinent Vitals/Pain Pain Assessment ?Pain Assessment: 0-10 ?Pain Score: 0-No pain ?Pain Intervention(s): Monitored during session  ? ? ?Home Living Family/patient expects to be discharged to:: Private residence ?Living Arrangements: Spouse/significant other ?Available Help at Discharge: Family;Available 24 hours/day ?Type of Home: House ?Home Access: Stairs to enter ?Entrance Stairs-Rails: Can reach both;Right;Left ?Entrance Stairs-Number of Steps: 2 ?  ?Home Layout: One level ?Home Equipment: Conservation officer, nature (2 wheels) ?   ?  ?Prior Function Prior Level of Function : Independent/Modified Independent;Driving ?  ?  ?  ?  ?  ?  ?  ?  ?  ? ? ?Hand Dominance  ? Dominant Hand: Right ? ?  ?Extremity/Trunk Assessment  ? Upper Extremity Assessment ?Upper Extremity Assessment: RUE deficits/detail;LUE deficits/detail;Generalized weakness (At least 4/5 strength bilaterally) ?RUE Deficits / Details: tremulous with R worse than L ?  ? ?Lower Extremity Assessment ?Lower Extremity Assessment: Generalized weakness (at least 4/5 strength bilaterally) ?  ? ?   ?Communication  ? Communication: Expressive difficulties  ?Cognition Arousal/Alertness: Awake/alert ?Behavior During Therapy: [facility] for tasks assessed/performed ?Overall Cognitive Status: Within Functional Limits for tasks assessed ?  ?  ?  ?  ?  ?  ?  ?  ?  ?  ?  ?  ?  ?  ?  ?  ?General Comments: A&Ox3-self,location,situation ?  ?  ? ?  ?General Comments General comments (skin integrity, edema, etc.): SpO2 ranged from 81% on room air to 98% on 3L via [place], HR ranged from 73-80bpm throughout session ? ?  ?Exercises Other Exercises ?Other Exercises: patient educated on role of PT in acute setting  ? ?Assessment/Plan  ?  ?PT Assessment Patient does not need any  further PT services  ?PT Problem List Decreased activity tolerance;Decreased balance;Decreased safety awareness ? ?   ?  ?PT Treatment Interventions     ? ?PT Goals (Current goals can be found in the Care Plan section)  ?Acute Rehab PT Goals ?Patient Stated Goal: go home ?PT Goal Formulation: All assessment and education complete, DC therapy ?Time For Goal Achievement: 01/14/22 ?Potential to Achieve Goals: Good ? ?  ?Frequency   ?  ? ? ?Co-evaluation   ?  ?  ?  ?  ? ? ?  ?AM-PAC PT "6 Clicks" Mobility  ?Outcome Measure Help needed turning from your back to your side while in a flat bed without using bedrails?: None ?Help needed moving from lying on your back to sitting on the side of a flat bed without using bedrails?: None ?Help needed moving to and from a bed to a chair (including a wheelchair)?: None ?Help needed standing up from a chair using your arms (e.g., wheelchair or bedside chair)?: None ?Help needed to walk in hospital room?: A Little ?Help needed climbing 3-5 steps with a railing? : A Little ?6 Click Score: 22 ? ?  ?End of Session Equipment Utilized During Treatment: Gait belt ?Activity  Tolerance: Patient tolerated treatment well ?Patient left: in bed;with bed alarm set;with family/visitor present ?Nurse Communication: Mobility status ?PT Visit Diagnosis: Difficulty in walking, not elsewhere classified (R26.2) ?  ? ?Time: 8366-2947 ?PT Time Calculation (min) (ACUTE ONLY): 18 min ? ? ?Charges:   PT Evaluation ?$PT Eval Low Complexity: 1 Low ?  ?  ?   ? ? ?Iva Boop, PT  ?01/14/22. 2:02 PM ? ? ?

## 2022-01-14 NOTE — Consult Note (Signed)
ANTICOAGULATION CONSULT NOTE ? ?Pharmacy Consult for IV Heparin ?Indication: pulmonary embolus ? ?Patient Measurements: ?Height: 5\' 2"  (157.5 cm) ?Weight: 73.3 kg (161 lb 9.6 oz) ?IBW/kg (Calculated) : 50.1 ?Heparin Dosing Weight: 65.8kg ? ?Labs: ?Recent Labs  ?  01/12/22 ?6378 01/13/22 ?0501 01/13/22 ?1722 01/14/22 ?0155  ?HGB 7.7* 7.9*  --  7.9*  ?HCT 23.3* 24.2*  --  24.5*  ?PLT 474* 480*  --  494*  ?APTT  --   --  39*  --   ?LABPROT  --   --  14.2  --   ?INR  --   --  1.1  --   ?HEPARINUNFRC  --   --  <0.10* 0.46  ?CREATININE 0.95 0.89  --   --   ? ? ? ?Estimated Creatinine Clearance: 55.2 mL/min (by C-G formula based on SCr of 0.89 mg/dL). ? ? ?Medications:  ?Apixaban 5 mg BID prior to admission ?Ticagrelor 90 mg BID continued from home medications while admitted ? 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 Patient further remained admitted for fevers of unknown origin and both ID and hematology / oncology following.  ?Apixaban has remained on hold for the admission and an IVC filter was placed. Patient was ultimately found to have a PE on CT scan 2/28 which may represent source of fevers. Pharmacy now consulted to initiate heparin infusion for PE treatment. ? ?CBC has been overall stable with Hgb between 7 - 8 g/dL. Mild thrombocytosis. Baseline aPTT, PT-INR, and heparin level are pending.  ? ?Date Time  HL Rate/Comment ?3/02 0155 0.46 Therapeutic x1; 1000 un/hr ?3/02  0757 0.22  Subtherapeutic; 1000 > 1150 un/hr ? ?Baseline Labs: ?aPTT - 39s; Anti-Xa: <0.1 (DOAC washed out) ?INR - 1.1 ?Hgb - 7.7>7.9 ?Plts - 480>494 ? ?Goal of Therapy:  ?Heparin  level 0.3-0.7 units/ml ?Monitor platelets by anticoagulation protocol: Yes ?  ?Plan:  ?HL subtherapeutic at 0.22 (3/02 0757). ?Bolus 1000 un x1; then increase infusion rate to 1150 un/h. ?Recheck HL in 6 hrs after rate change on 3/2 @ 1530.  ?CTM CBC daily while on heparin gtt. ? ?Lorna Dibble, PharmD, BCCP ?Clinical Pharmacist ?01/14/2022 8:14 AM ? ? ? ?

## 2022-01-14 NOTE — Consult Note (Signed)
ANTICOAGULATION CONSULT NOTE ? ?Pharmacy Consult for IV Heparin ?Indication: pulmonary embolus ? ?Patient Measurements: ?Height: 5\' 2"  (157.5 cm) ?Weight: 73.3 kg (161 lb 9.6 oz) ?IBW/kg (Calculated) : 50.1 ?Heparin Dosing Weight: 65.8 ? ?Labs: ?Recent Labs  ?  01/12/22 ?9470 01/13/22 ?0501 01/13/22 ?1722 01/14/22 ?0155  ?HGB 7.7* 7.9*  --  7.9*  ?HCT 23.3* 24.2*  --  24.5*  ?PLT 474* 480*  --  494*  ?APTT  --   --  39*  --   ?LABPROT  --   --  14.2  --   ?INR  --   --  1.1  --   ?HEPARINUNFRC  --   --  <0.10* 0.46  ?CREATININE 0.95 0.89  --   --   ? ? ? ?Estimated Creatinine Clearance: 55.2 mL/min (by C-G formula based on SCr of 0.89 mg/dL). ? ? ?Medications:  ?Apixaban 5 mg BID prior to admission ?Ticagrelor 90 mg BID continued from home medications while admitted ? 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 Patient further remained admitted for fevers of unknown origin and both ID and hematology / oncology following. Apixaban has remained on hold for the admission and an IVC filter was placed. Patient was ultimately found to have a PE on CT scan 2/28 which may represent source of fevers. Pharmacy now consulted to initiate heparin infusion for PE treatment. ? ?CBC has been overall stable with Hgb between 7 - 8 g/dL. Mild thrombocytosis. Baseline aPTT, PT-INR, and heparin level are pending.  ? ?Goal of Therapy:  ?Heparin level 0.3-0.7 units/ml ?Monitor platelets by anticoagulation protocol: Yes ?  ?Plan:  ?3/2:  HL @ 0155 = 0.46, therapeutic X 1 ?Will continue pt on current rate and recheck HL in 6 hrs on 3/2 @ 0800.  ? ?Avari Gelles D ?01/14/2022,5:26  AM ? ? ?

## 2022-01-14 NOTE — Progress Notes (Signed)
PT Cancellation Note ? ?Patient Details ?Name: Andrea Williamson ?MRN: [id number] ?DOB: 10/05/1951 ? ? ?Cancelled Treatment:    Reason Eval/Treat Not Completed: Medical issues which prohibited therapy Due to PE found on 2/28, and anti-coagulation therapy started on 01/13/22 at 2022 will hold on PT at this time, and will re-attempt at a later time/date as available and patient medically appropriate for PT. Thank you! ? ?Iva Boop, PT  ?01/14/22. 10:33 AM ? ?

## 2022-01-14 NOTE — Plan of Care (Signed)
  Problem: Clinical Measurements: Goal: Ability to maintain clinical measurements within normal limits will improve Outcome: Progressing   Problem: Clinical Measurements: Goal: Will remain free from infection Outcome: Progressing   Problem: Clinical Measurements: Goal: Diagnostic test results will improve Outcome: Progressing   

## 2022-01-14 NOTE — Consult Note (Addendum)
ANTICOAGULATION CONSULT NOTE ? ?Pharmacy Consult for Eliquis ?Indication: pulmonary embolus ? ?Patient Measurements: ?Height: 5\' 2"  (157.5 cm) ?Weight: 73.3 kg (161 lb 9.6 oz) ?IBW/kg (Calculated) : 50.1 ? ?Labs: ?Recent Labs  ?  01/12/22 ?9983 01/13/22 ?0501 01/13/22 ?1722 01/14/22 ?0155 01/14/22 ?3825  ?HGB 7.7* 7.9*  --  7.9*  --   ?HCT 23.3* 24.2*  --  24.5*  --   ?PLT 474* 480*  --  494*  --   ?APTT  --   --  39*  --   --   ?LABPROT  --   --  14.2  --   --   ?INR  --   --  1.1  --   --   ?HEPARINUNFRC  --   --  <0.10* 0.46 0.22*  ?CREATININE 0.95 0.89  --   --   --   ? ? ? ?Estimated Creatinine Clearance: 55.2 mL/min (by C-G formula based on SCr of 0.89 mg/dL). ? ? ?Medications:  ?Apixaban 5 mg BID prior to admission ?Ticagrelor 90 mg BID continued from home medications while admitted ? 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 Apixaban has remained on hold for the admission and an IVC filter was placed. Patient  found to have a PE on CT scan 2/28. Pharmacy now consulted to switch patient to Eliquis.  ? ? ?Goal of Therapy:  ?Monitor platelets by anticoagulation protocol: Yes ?  ?Plan:  ?Discontinue heparin. Discussed with MD if pt should receive full load vs continuation of home dose given new PE and pt also with history of nosebleeds. MD okay with continuation of home dose. Will order Eliquis 5 mg BID. CBC per protocol ? ? ?Sherilyn Banker, PharmD ?Clinical Pharmacist  ?01/14/2022 2:48 PM  ? ? ? ? ?

## 2022-01-15 ENCOUNTER — Other Ambulatory Visit: Payer: Self-pay

## 2022-01-15 ENCOUNTER — Other Ambulatory Visit: Payer: Self-pay | Admitting: *Deleted

## 2022-01-15 DIAGNOSIS — D5919 Other autoimmune hemolytic anemia: Secondary | ICD-10-CM

## 2022-01-15 LAB — CBC
HCT: 24.4 % — ABNORMAL LOW (ref 36.0–46.0)
Hemoglobin: 7.8 g/dL — ABNORMAL LOW (ref 12.0–15.0)
MCH: 33.5 pg (ref 26.0–34.0)
MCHC: 32 g/dL (ref 30.0–36.0)
MCV: 104.7 fL — ABNORMAL HIGH (ref 80.0–100.0)
Platelets: 556 10*3/uL — ABNORMAL HIGH (ref 150–400)
RBC: 2.33 MIL/uL — ABNORMAL LOW (ref 3.87–5.11)
RDW: 15.4 % (ref 11.5–15.5)
WBC: 9.2 10*3/uL (ref 4.0–10.5)
nRBC: 0.4 % — ABNORMAL HIGH (ref 0.0–0.2)

## 2022-01-15 MED ORDER — METOPROLOL SUCCINATE ER 25 MG PO TB24: 25.0000 mg | ORAL_TABLET | Freq: Every day | ORAL | 11 refills | Status: AC

## 2022-01-15 NOTE — Discharge Summary (Signed)
Andrea Williamson:[id number] DOB: 1950/11/24 DOA: 01/05/2022  PCP: Andrea Guadeloupe, MD  Admit date: 01/05/2022 Discharge date: 01/15/2022  Time spent: 40 minutes  Recommendations for Outpatient Follow-up:  Pcp f/u 1-2 weeks, assess O2 need at that time Close f/u with hematology  Gyn/onc f/u    Discharge Diagnoses:  Principal Problem:   Symptomatic anemia Active Problems:   History of stroke   Essential hypertension   Warm autoimmune hemolytic anemia (HCC)   DVT (deep venous thrombosis) ([place])   Fever   Discharge Condition: stable  Diet recommendation: heart healthy  Filed Weights   01/04/22 1830  Weight: 73.3 kg    History of present illness:  Andrea Williamson is a 71 y.o. 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  Patient states that she had some epistaxis at home and it only happened when she blew her nose but that has resolved.  She is unable to tell me if she has had any bloody stools or melena but per the ER provider  She had dark stools. She denies having any hematemesis, no cough, no abdominal pain, no chest pain, no shortness of breath, no nausea, no vomiting, no headache.  She has no urinary symptoms She did have a fever in the ER with a Tmax of 102.5.  Hospital Course:   Anemia: recent hx warm hemolytic anemia but no signs of hemolysis. Hgb on admission around 8 and has stayed steady there, from 11.7 few weeks ago. GI does not think GI bleed. Heme following, advises close outpt heme f/u.   Fever:  etiology unclear, initially continued to spike fevers despite abxs, though last fever was now > 72  hours ago. Blood cxs NGTD & repeat blood cxs NGTD. CXR shows pulmonary interstitial edema no pneumonia. COVID19, influenza are both neg. Treated with flagyl and ceftriaxone, now discontinued. ID has followed, now signed off, thinks fever could be 2/2 PE.  Previous provider curbsided rheum, Dr. Jefm Bryant, who thinks it is unlikely pt has autoimmune/rheum issue.    SVT: s/p IV lopressor x 1 on 01/08/22. Back in sinus rhythm.  Echo shows EF 53-29%, grade I diastolic dysfunction & no atrial level shunt detected. Discharged on metoprolol   DVT, PE: s/p IVC filter placed on 01/06/22 by vasc surg. Now with new segmental PE seen on 2/28. Here treated with heparin and then was transitioned back to home eliquis.  TTE no signs right heart strain.   Hypoxic respiratory failure: likely 2/2 atelectasis, PE. 3L O2 ordered at d/c   HTN: controlled   Hx of CVA: due to left M2 occlusion status post thrombectomy with revascularization and left A2 stenting. Continuing brillinta   Adnexal mass: will need outpt gyn f/u, has already established w/ them, plan is repeat MRI in april  Procedures: none   Consultations: GI, ID, hematology  Discharge Exam: Vitals:   01/15/22 0500 01/15/22 0756  BP: (!) 156/80 139/74  Pulse: 65 75  Resp: 17 16  Temp: 98.2 F (36.8 C) 97.9 F (36.6 C)  SpO2: 90% 98%    General exam: Appears frustrated Respiratory system: clear breath sounds b/l  Cardiovascular system: S1 & S2+. No rubs or  clicks  Gastrointestinal system: Abd is soft, NT, ND & normal bowel sounds  Central nervous system: awake and alert. Moves all extremities  Psychiatry: judgement and insight appear normal. Agitated/frustrated mood and affect  Discharge Instructions   Discharge Instructions     Diet - low sodium heart healthy   Complete by: As directed    Increase activity slowly   Complete by: As directed       Allergies as of 01/15/2022   No Known Allergies      Medication List     STOP  taking these medications    predniSONE 10 MG tablet Commonly known as: DELTASONE       TAKE these medications    ALPRAZolam 0.25 MG tablet Commonly known as: XANAX Take 1 tablet (0.25 mg total) by mouth at bedtime as needed for anxiety. Take 1/2 tablet at bedtime for difficulty sleeping. What changed:  how much to take when to take this additional instructions   amLODipine 5 MG tablet Commonly known as: NORVASC Take 1 tablet (5 mg total) by mouth daily.   apixaban 5 MG Tabs tablet Commonly known as: ELIQUIS Take 1 tablet (5 mg total) by mouth 2 (two) times daily.   atorvastatin 80 MG tablet Commonly known as: LIPITOR Take 1 tablet (80 mg total) by mouth daily.   ferrous sulfate 325 (65 FE) MG tablet Take 325 mg by mouth daily with breakfast.   folic acid 1 MG tablet Commonly known as: FOLVITE Take 1 tablet (1 mg total) by mouth daily.   hydrALAZINE 25 MG tablet Commonly known as: APRESOLINE Take 1 tablet (25 mg total) by mouth every 8 (eight) hours as needed (SBP >170 or DBP >110).   magnesium gluconate 500 MG tablet Commonly known as: MAGONATE Take 0.5 tablets (250 mg total) by mouth at bedtime.   metoprolol succinate 25 MG 24 hr tablet Commonly known as: Toprol XL Take 1 tablet (25 mg total) by mouth daily.   pantoprazole 40 MG tablet Commonly known as: Protonix Take 1 tablet (40 mg total) by mouth daily.   ticagrelor 60 MG Tabs tablet Commonly known as: Brilinta Take 1 tablet (60 mg total) by mouth 2 (two) times daily.               Durable Medical Equipment  (From admission, onward)           Start     Ordered   01/15/22 1157  DME Oxygen  Once       Question Answer Comment  Length of Need 6 Months   Mode or (Route) Nasal cannula   Liters per Minute 3   Frequency Continuous (stationary and portable oxygen unit needed)   Oxygen delivery system Gas      01/15/22 1157           No Known Allergies    The results of significant  diagnostics from this hospitalization (including imaging, microbiology, ancillary and laboratory) are listed below for reference.    Significant Diagnostic Studies: CT CHEST ABDOMEN PELVIS W CONTRAST  Result Date: 01/12/2022 CLINICAL DATA:  Sepsis. EXAM: CT CHEST, ABDOMEN, AND PELVIS WITH CONTRAST TECHNIQUE: Multidetector CT imaging of the chest, abdomen and pelvis was performed following the standard protocol during bolus administration of intravenous contrast. RADIATION DOSE REDUCTION: This exam was performed according to the departmental dose-optimization program which includes automated exposure control, adjustment of the mA and/or kV according to patient size and/or use of iterative reconstruction technique. CONTRAST:  175mL OMNIPAQUE  IOHEXOL 300 MG/ML  SOLN COMPARISON:  Chest radiograph dated 01/05/2022. CT dated 11/13/2021. FINDINGS: CT CHEST FINDINGS Cardiovascular: There is no cardiomegaly or pericardial effusion. There is coronary vascular calcification. Mild atherosclerotic calcification of the thoracic aorta. No aneurysmal dilatation or dissection. The origins of the great vessels of the aortic arch appear patent. There is partially occlusive thrombus in the posterior basal segment of the left lower lobe pulmonary artery branch. No evidence of right heart straining. Mediastinum/Nodes: No hilar or mediastinal adenopathy. Small hilar and mediastinal calcified granuloma. The esophagus and the thyroid gland are grossly unremarkable. No mediastinal fluid collection. Lungs/Pleura: Background of centrilobular emphysema. There are biapical subpleural scarring. No focal consolidation, pleural effusion, or pneumothorax. The central airways are patent. Musculoskeletal: Degenerative changes of the spine. No acute osseous pathology. CT ABDOMEN PELVIS FINDINGS No intra-abdominal free air or free fluid. Hepatobiliary: Fatty liver. No intrahepatic biliary dilatation. The gallbladder is predominantly contracted. No  calcified gallstone. Slightly high attenuating content in the gallbladder fundus may represent sludge, noncalcified stone, or focus of adenomyomatosis. No pericholecystic fluid or evidence of acute cholecystitis by CT. Pancreas: Unremarkable. No pancreatic ductal dilatation or surrounding inflammatory changes. Spleen: Normal in size without focal abnormality. Adrenals/Urinary Tract: The adrenal glands unremarkable. Subcentimeter right renal hypodense focus is too small to characterize. There is no hydronephrosis on either side. There is symmetric enhancement and excretion of contrast by both kidneys. The visualized ureters and urinary bladder appear unremarkable. Stomach/Bowel: No bowel obstruction or active inflammation. The appendix is normal. Vascular/Lymphatic: Moderate aortoiliac atherosclerotic disease. An infrarenal IVC filter is noted. The IVC is unremarkable. No portal venous gas. There is no adenopathy. Reproductive: The uterus is anteverted. A 2.5 cm left uterine fibroid noted. There is a 2.7 cm cyst in the right ovary similar to prior CT. This was better evaluated on ultrasound of 11/13/2021. The left ovary is unremarkable. Other: Small fat containing umbilical hernia. Musculoskeletal: Degenerative changes of the spine. No acute osseous pathology. IMPRESSION: 1. Partially occlusive left lower lobe posterior basal segmental pulmonary artery embolism. No evidence of right heart straining. 2. No bowel obstruction. Normal appendix. 3. Fatty liver. 4. Aortic Atherosclerosis (ICD10-I70.0) and Emphysema (ICD10-J43.9). These results were called by telephone at the time of interpretation on 01/12/2022 at 8:32 pm to nurse practitioner, Otilio Miu, who verbally acknowledged these results. Electronically Signed   By: Anner Crete M.D.   On: 01/12/2022 20:37   PERIPHERAL VASCULAR CATHETERIZATION  Result Date: 01/06/2022 See surgical note for result.  DG Chest Port 1 View  Result Date: 01/05/2022 CLINICAL DATA:   Weakness and lightheadedness. EXAM: PORTABLE CHEST 1 VIEW COMPARISON:  CT of the chest on 11/13/2021 FINDINGS: The heart size and mediastinal contours are within normal limits. Mild pulmonary interstitial prominence without overt edema, pleural fluid or pneumothorax. Mild underlying pulmonary interstitial edema cannot be excluded. There is atelectasis at both lung bases, right greater than left. The visualized skeletal structures are unremarkable. IMPRESSION: Mild pulmonary interstitial prominence may represent mild underlying pulmonary interstitial edema. Bibasilar atelectasis, right greater than left. Electronically Signed   By: Aletta Edouard M.D.   On: 01/05/2022 09:07   ECHOCARDIOGRAM COMPLETE  Result Date: 01/10/2022    ECHOCARDIOGRAM REPORT   Patient Name:   Ming JEZABEL LECKER Date of Exam: 01/10/2022 Medical Rec #:  [id number]  Height:       62.0 in Accession #:    [id number] Weight:       161.6 lb Date of Birth:  Jul 30, 1951  BSA:  1.746 m Patient Age:    40 years   BP:           145/75 mmHg Patient Gender: F          HR:           66 bpm. Exam Location:  [facility] Procedure: 2D Echo Indications:     Fever R50.9  History:         Patient has prior history of Echocardiogram examinations, most                  recent 10/02/2021.  Sonographer:     Kathlen Brunswick RDCS Referring Phys:  [id number] Wyvonnia Dusky Diagnosing Phys: Yolonda Kida MD IMPRESSIONS  1. Left ventricular ejection fraction, by estimation, is 65 to 70%. The left ventricle has normal function. The left ventricle has no regional wall motion abnormalities. Left ventricular diastolic parameters are consistent with Grade I diastolic dysfunction (impaired relaxation).  2. Right ventricular systolic function is normal. The right ventricular size is normal.  3. The mitral valve is grossly normal. No evidence of mitral valve regurgitation.  4. The aortic valve is normal in structure. Aortic valve regurgitation is not visualized. FINDINGS  Left  Ventricle: Left ventricular ejection fraction, by estimation, is 65 to 70%. The left ventricle has normal function. The left ventricle has no regional wall motion abnormalities. The left ventricular internal cavity size was normal in size. There is  no left ventricular hypertrophy. Left ventricular diastolic parameters are consistent with Grade I diastolic dysfunction (impaired relaxation). Right Ventricle: The right ventricular size is normal. No increase in right ventricular wall thickness. Right ventricular systolic function is normal. Left Atrium: Left atrial size was normal in size. Right Atrium: Right atrial size was normal in size. Pericardium: There is no evidence of pericardial effusion. Mitral Valve: The mitral valve is grossly normal. No evidence of mitral valve regurgitation. Tricuspid Valve: The tricuspid valve is normal in structure. Tricuspid valve regurgitation is not demonstrated. Aortic Valve: The aortic valve is normal in structure. Aortic valve regurgitation is not visualized. Aortic valve peak gradient measures 10.1 mmHg. Pulmonic Valve: The pulmonic valve was normal in structure. Pulmonic valve regurgitation is not visualized. Aorta: The ascending aorta was not well visualized. IAS/Shunts: No atrial level shunt detected by color flow Doppler.  LEFT VENTRICLE PLAX 2D LVIDd:         4.60 cm     Diastology LVIDs:         2.90 cm     LV e' medial:    5.22 cm/s LV PW:         1.40 cm     LV E/e' medial:  12.5 LV IVS:        1.30 cm     LV e' lateral:   3.81 cm/s LVOT diam:     1.80 cm     LV E/e' lateral: 17.1 LV SV:         58 LV SV Index:   33 LVOT Area:     2.54 cm  LV Volumes (MOD) LV vol d, MOD A4C: 64.6 ml LV vol s, MOD A4C: 26.2 ml LV SV MOD A4C:     64.6 ml RIGHT VENTRICLE RV Basal diam:  3.30 cm RV S prime:     19.90 cm/s TAPSE (M-mode): 2.2 cm LEFT ATRIUM             Index        RIGHT ATRIUM  Index LA diam:        2.40 cm 1.37 cm/m   RA Area:     17.40 cm LA Vol (A2C):   33.9  ml 19.41 ml/m  RA Volume:   52.30 ml  29.95 ml/m LA Vol (A4C):   42.1 ml 24.11 ml/m LA Biplane Vol: 40.5 ml 23.19 ml/m  AORTIC VALVE                 PULMONIC VALVE AV Area (Vmax): 1.78 cm     PV Vmax:       1.14 m/s AV Vmax:        159.00 cm/s  PV Peak grad:  5.2 mmHg AV Peak Grad:   10.1 mmHg LVOT Vmax:      111.00 cm/s LVOT Vmean:     70.000 cm/s LVOT VTI:       0.229 m  AORTA Ao Root diam: 2.90 cm Ao Asc diam:  3.20 cm MITRAL VALVE               TRICUSPID VALVE MV Area (PHT): 3.46 cm    TV Peak grad:   22.8 mmHg MV Decel Time: 219 msec    TV Vmax:        2.39 m/s MV E velocity: 65.10 cm/s MV A velocity: 81.80 cm/s  SHUNTS MV E/A ratio:  0.80        Systemic VTI:  0.23 m                            Systemic Diam: 1.80 cm Dwayne D Callwood MD Electronically signed by Yolonda Kida MD Signature Date/Time: 01/10/2022/2:25:19 PM    Final    LONG TERM MONITOR (3-14 DAYS)  Result Date: 12/28/2021 Patch Wear Time:  13 days and 13 hours (2023-01-20T15:50:58-0500 to 2023-02-03T05:28:36-0500) Patient had a min HR of 42 bpm, max HR of 164 bpm, and avg HR of 61 bpm. Predominant underlying rhythm was Sinus Rhythm. 1 run of Ventricular Tachycardia occurred lasting 12 beats with a max rate of 164 bpm (avg 120 bpm). 26 Supraventricular Tachycardia runs occurred, the run with the fastest interval lasting 6 beats with a max rate of 154 bpm, the longest lasting 28.3 secs with an avg rate of 135 bpm. Some episodes of Supraventricular Tachycardia may be possible Atrial Tachycardia with variable block. Isolated SVEs were rare (<1.0%), SVE Couplets were rare (<1.0%), and SVE Triplets were rare (<1.0%). Isolated VEs were rare (<1.0%), VE Couplets were rare (<1.0%), and no VE Triplets were present. Ventricular Bigeminy and Trigeminy were present. Paroxysmal SVT and VT noted.  No evidence for atrial fibrillation.   Microbiology: Recent Results (from the past 240 hour(s))  CULTURE, BLOOD (ROUTINE X 2) w Reflex to ID Panel      Status: None   Collection Time: 01/08/22  2:12 PM   Specimen: BLOOD  Result Value Ref Range Status   Specimen Description BLOOD RIGHT ANTECUBITAL  Final   Special Requests   Final    BOTTLES DRAWN AEROBIC AND ANAEROBIC Blood Culture adequate volume   Culture   Final    NO GROWTH 5 DAYS Performed at [facility], [address] [place], [place] [address]    Report Status 01/13/2022 FINAL  Final  CULTURE, BLOOD (ROUTINE X 2) w Reflex to ID Panel     Status: None   Collection Time: 01/08/22  2:14 PM   Specimen: BLOOD  Result Value Ref  Range Status   Specimen Description BLOOD BLOOD RIGHT HAND  Final   Special Requests   Final    BOTTLES DRAWN AEROBIC AND ANAEROBIC Blood Culture results may not be optimal due to an inadequate volume of blood received in culture bottles   Culture   Final    NO GROWTH 5 DAYS Performed at [facility], [address] [place],  [address]    Report Status 01/13/2022 FINAL  Final     Labs: Basic Metabolic Panel: Recent Labs  Lab 01/09/22 6415 01/10/22 0358 01/11/22 0411 01/12/22 0350 01/12/22 0914 01/13/22 0501  NA 135 134* 137  --  134* 136  K 3.3* 3.2* 3.5  --  3.4* 3.8  CL 102 102 102  --  102 104  CO2 20* 22 23  --  24 26  GLUCOSE 101* 113* 66*  --  128* 93  BUN 22 19 20   --  17 13  CREATININE 1.01* 1.04* 1.01*  --  0.95 0.89  CALCIUM 8.8* 8.8* 9.1  --  8.8* 9.0  MG  --   --  1.8 1.7  --  1.9   Liver Function Tests: Recent Labs  Lab 01/08/22 1412 01/12/22 0914  AST 48* 32  ALT 42 32  ALKPHOS 62 59  BILITOT 0.8 0.5  PROT 6.9 6.1*  ALBUMIN 2.8* 2.6*   No results for input(s): LIPASE, AMYLASE in the last 168 hours. No results for input(s): AMMONIA in the last 168 hours. CBC: Recent Labs  Lab 01/11/22 0411 01/12/22 0914 01/13/22 0501 01/14/22 0155 01/15/22 0421  WBC 9.9 9.7 10.2 9.9 9.2  NEUTROABS  --  7.2  --   --   --   HGB 7.9* 7.7* 7.9* 7.9* 7.8*  HCT 24.6* 23.3* 24.2* 24.5* 24.4*  MCV  104.7* 102.2* 102.5* 103.4* 104.7*  PLT 453* 474* 480* 494* 556*   Cardiac Enzymes: No results for input(s): CKTOTAL, CKMB, CKMBINDEX, TROPONINI in the last 168 hours. BNP: BNP (last 3 results) No results for input(s): BNP in the last 8760 hours.  ProBNP (last 3 results) No results for input(s): PROBNP in the last 8760 hours.  CBG: No results for input(s): GLUCAP in the last 168 hours.     Signed:  Desma Maxim MD.  [facility] 01/15/2022, 11:58 AM

## 2022-01-15 NOTE — Progress Notes (Signed)
Physical Therapy Treatment ?Patient Details ?Name: Andrea Williamson ?MRN: [id number] ?DOB: 10-22-1951 ?Today's Date: 01/15/2022 ? ? ?History of Present Illness Patient is a 71 year old female who reports to [facility] for symptomatic anemia. 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 ? ?  ?PT Comments  ? ? Pt still with active PT orders so seen for PT tx. Pt received in bed anticipating d/c any time now. Pt is able to complete bed mobility with mod I & ambulates 30 ft with O2 tank & supervision with gait pattern as noted below; pt frequently reaches for objects for support (countertop, rail in hallway). Pt endorses feeling dizzy with gait but SpO2 on 2L/min 91% (lowest reading) & BP 117/100 mmHg MAP 105. Discussed f/u PT with pt declining HHPT services but reports she was receiving OPPT prior to admission. Recommend pt resume OPPT services.  ?  ?Recommendations for follow up therapy are one component of a multi-disciplinary discharge planning process, led by the attending physician.  Recommendations may be updated based on patient status, additional functional criteria and insurance authorization. ? ?Follow Up Recommendations ?  (pt reports she was participating in [place], educated pt to resume those therapies (pt also declined HHPT)) ?  ?  ?Assistance Recommended at Discharge PRN  ?Patient can return home with the following A little help with walking and/or transfers;Assistance with cooking/housework;Help with stairs or ramp for entrance;Assist for transportation ?  ?Equipment Recommendations ? None recommended by PT  ?  ?Recommendations for Other Services   ? ? ?  ?Precautions / Restrictions Precautions ?Precautions: Fall ?Restrictions ?Weight Bearing Restrictions: No  ?  ? ?Mobility ? Bed Mobility ?Overal bed mobility: Modified Independent ?  ?  ?  ?  ?  ?  ?General  bed mobility comments: supine<>sit with HOB elevated ?  ? ?Transfers ?Overall transfer level: Modified independent ?Equipment used: None ?  ?  ?  ?  ?  ?  ?  ?  ?  ? ?Ambulation/Gait ?Ambulation/Gait assistance: Supervision ?Gait Distance (Feet): 30 Feet ?Assistive device:  (pt elects to push O2 tank) ?Gait Pattern/deviations: Trunk flexed, Decreased step length - right, Decreased step length - left, Decreased dorsiflexion - left, Decreased dorsiflexion - right, Decreased stride length ?Gait velocity: decreased ?  ?  ?  ? ? ?Stairs ?  ?  ?  ?  ?  ? ? ?Wheelchair Mobility ?  ? ?Modified Rankin (Stroke Patients Only) ?  ? ? ?  ?Balance Overall balance assessment: Needs assistance ?Sitting-balance support: No upper extremity supported, Feet supported ?Sitting balance-Leahy Scale: Normal ?  ?  ?Standing balance support: During functional activity, Single extremity supported ?Standing balance-Leahy Scale: Fair ?  ?  ?  ?  ?  ?  ?  ?  ?  ?  ?  ?  ?  ? ?  ?Cognition Arousal/Alertness: Awake/alert ?Behavior During Therapy: [facility] for tasks assessed/performed ?Overall Cognitive Status: Within Functional Limits for tasks assessed ?  ?  ?  ?  ?  ?  ?  ?  ?  ?  ?  ?  ?  ?  ?  ?  ?  ?  ?  ? ?  ?Exercises   ? ?  ?General Comments   ?  ?  ? ?Pertinent Vitals/Pain Pain Assessment ?Pain Assessment: No/denies pain  ? ? ?Home Living   ?  ?  ?  ?  ?  ?  ?  ?  ?  ?   ?  ?  Prior Function    ?  ?  ?   ? ?PT Goals (current goals can now be found in the care plan section) Acute Rehab PT Goals ?Patient Stated Goal: go home ?PT Goal Formulation: With patient ?Time For Goal Achievement: 01/14/22 ?Potential to Achieve Goals: Good ?Progress towards PT goals: Progressing toward goals ? ?  ?Frequency ? ? ? Min 2X/week ? ? ? ?  ?PT Plan    ? ? ?Co-evaluation   ?  ?  ?  ?  ? ?  ?AM-PAC PT "6 Clicks" Mobility   ?Outcome Measure ? Help needed turning from your back to your side while in a flat bed without using bedrails?: None ?Help needed moving from  lying on your back to sitting on the side of a flat bed without using bedrails?: None ?Help needed moving to and from a bed to a chair (including a wheelchair)?: None ?Help needed standing up from a chair using your arms (e.g., wheelchair or bedside chair)?: None ?Help needed to walk in hospital room?: A Little ?Help needed climbing 3-5 steps with a railing? : A Little ?6 Click Score: 22 ? ?  ?End of Session   ?Activity Tolerance: Patient tolerated treatment well ?Patient left: in bed;with call bell/phone within reach ?Nurse Communication: Mobility status (BP) ?PT Visit Diagnosis: Difficulty in walking, not elsewhere classified (R26.2);Muscle weakness (generalized) (M62.81) ?  ? ? ?Time: 6295-2841 ?PT Time Calculation (min) (ACUTE ONLY): 8 min ? ?Charges:  $Therapeutic Activity: 8-22 mins          ?          ? ?Lavone Nian, PT, DPT ?01/15/22, 2:14 PM ? ? ? ?[name] ?01/15/2022, 2:12 PM ? ?

## 2022-01-15 NOTE — Progress Notes (Signed)
Patient on 3L O2 at rest, 94%.  ?Patient on room air at rest, 88%.  ?Patient with exertion on room air, O2 at 82%. ?Patient placed back on 3L to recover up to 94% with rest.  ?

## 2022-01-15 NOTE — TOC Progression Note (Signed)
Transition of Care (TOC) - Progression Note  ? ? ?Patient Details  ?Name: Andrea Williamson ?MRN: [id number] ?Date of Birth: 1951/06/24 ? ?Transition of Care (TOC) CM/SW Contact  ?Torez Beauregard A Hadja Harral, LCSW ?Phone Number: ?01/15/2022, 11:57 AM ? ?Clinical Narrative:   02 ordered through [employer]. ? ? ? ?Expected Discharge Plan: Home/Self Care ?Barriers to Discharge: Continued Medical Work up ? ?Expected Discharge Plan and Services ?Expected Discharge Plan: Home/Self Care ?  ?  ?  ?Living arrangements for the past 2 months: [place] ?Expected Discharge Date: 01/15/22               ?  ?  ?  ?  ?  ?  ?  ?  ?  ?  ? ? ?Social Determinants of Health (SDOH) Interventions ?  ? ?Readmission Risk Interventions ?Readmission Risk Prevention Plan 01/09/2022  ?Transportation Screening Complete  ?PCP or Specialist Appt within 3-5 Days Complete  ?[place] or Home Care Consult Complete  ?Social Work Consult for [place] Planning/Counseling Complete  ?Palliative Care Screening Not Applicable  ?Medication Review Press photographer) Complete  ?Some encounter information is confidential and restricted. Go to Review Flowsheets activity to see all data.  ?Some recent data might be hidden  ? ? ?

## 2022-01-15 NOTE — Care Management Important Message (Signed)
Important Message ? ?Patient Details  ?Name: Andrea Williamson ?MRN: [id number] ?Date of Birth: 15-Mar-1951 ? ? ?Medicare Important Message Given:  Yes ? ? ? ? ?Andrea Williamson ?01/15/2022, 11:13 AM ?

## 2022-01-16 DIAGNOSIS — I10 Essential (primary) hypertension: Secondary | ICD-10-CM | POA: Diagnosis not present

## 2022-01-16 DIAGNOSIS — D5911 Warm autoimmune hemolytic anemia: Secondary | ICD-10-CM | POA: Diagnosis not present

## 2022-01-16 DIAGNOSIS — R509 Fever, unspecified: Secondary | ICD-10-CM | POA: Diagnosis not present

## 2022-01-16 DIAGNOSIS — Z8673 Personal history of transient ischemic attack (TIA), and cerebral infarction without residual deficits: Secondary | ICD-10-CM | POA: Diagnosis not present

## 2022-01-16 LAB — PARASITE EXAM, BLOOD

## 2022-01-19 ENCOUNTER — Other Ambulatory Visit: Payer: Self-pay

## 2022-01-19 ENCOUNTER — Ambulatory Visit: Payer: Medicare Other | Admitting: Physical Therapy

## 2022-01-19 NOTE — Patient Outreach (Signed)
[place] [facility]) Care Management ? ?01/19/2022 ? ?Andrea Williamson ?Dec 31, 1950 ?[id number] ? ? ?Patient refused to do mRs, phone also disconnected after patient refused. mRs=7 ?  ? ?Philmore Pali ?Care Management Assistant ?[phone] ? ?

## 2022-01-20 ENCOUNTER — Encounter: Payer: Self-pay | Admitting: Internal Medicine

## 2022-01-20 ENCOUNTER — Other Ambulatory Visit: Payer: Self-pay

## 2022-01-20 ENCOUNTER — Encounter: Payer: Self-pay | Admitting: Oncology

## 2022-01-20 ENCOUNTER — Other Ambulatory Visit: Payer: Self-pay | Admitting: Oncology

## 2022-01-20 ENCOUNTER — Ambulatory Visit: Payer: Medicare Other | Admitting: Occupational Therapy

## 2022-01-20 ENCOUNTER — Inpatient Hospital Stay: Payer: Medicare Other

## 2022-01-20 ENCOUNTER — Inpatient Hospital Stay (HOSPITAL_BASED_OUTPATIENT_CLINIC_OR_DEPARTMENT_OTHER): Payer: Medicare Other | Admitting: Oncology

## 2022-01-20 VITALS — BP 128/90 | HR 93 | Temp 96.4°F | Resp 16 | Wt 153.8 lb

## 2022-01-20 DIAGNOSIS — Z7901 Long term (current) use of anticoagulants: Secondary | ICD-10-CM | POA: Diagnosis not present

## 2022-01-20 DIAGNOSIS — J432 Centrilobular emphysema: Secondary | ICD-10-CM | POA: Diagnosis not present

## 2022-01-20 DIAGNOSIS — I82403 Acute embolism and thrombosis of unspecified deep veins of lower extremity, bilateral: Secondary | ICD-10-CM | POA: Diagnosis not present

## 2022-01-20 DIAGNOSIS — N83201 Unspecified ovarian cyst, right side: Secondary | ICD-10-CM | POA: Diagnosis not present

## 2022-01-20 DIAGNOSIS — R531 Weakness: Secondary | ICD-10-CM | POA: Diagnosis not present

## 2022-01-20 DIAGNOSIS — D5919 Other autoimmune hemolytic anemia: Secondary | ICD-10-CM

## 2022-01-20 DIAGNOSIS — K760 Fatty (change of) liver, not elsewhere classified: Secondary | ICD-10-CM | POA: Diagnosis not present

## 2022-01-20 DIAGNOSIS — R42 Dizziness and giddiness: Secondary | ICD-10-CM | POA: Diagnosis not present

## 2022-01-20 DIAGNOSIS — I2699 Other pulmonary embolism without acute cor pulmonale: Secondary | ICD-10-CM | POA: Diagnosis not present

## 2022-01-20 DIAGNOSIS — Z809 Family history of malignant neoplasm, unspecified: Secondary | ICD-10-CM | POA: Diagnosis not present

## 2022-01-20 DIAGNOSIS — K429 Umbilical hernia without obstruction or gangrene: Secondary | ICD-10-CM | POA: Diagnosis not present

## 2022-01-20 DIAGNOSIS — R110 Nausea: Secondary | ICD-10-CM | POA: Diagnosis not present

## 2022-01-20 DIAGNOSIS — E785 Hyperlipidemia, unspecified: Secondary | ICD-10-CM | POA: Diagnosis not present

## 2022-01-20 DIAGNOSIS — I10 Essential (primary) hypertension: Secondary | ICD-10-CM | POA: Diagnosis not present

## 2022-01-20 DIAGNOSIS — D5911 Warm autoimmune hemolytic anemia: Secondary | ICD-10-CM | POA: Diagnosis not present

## 2022-01-20 DIAGNOSIS — I471 Supraventricular tachycardia: Secondary | ICD-10-CM | POA: Diagnosis not present

## 2022-01-20 DIAGNOSIS — Z836 Family history of other diseases of the respiratory system: Secondary | ICD-10-CM | POA: Diagnosis not present

## 2022-01-20 DIAGNOSIS — Z79899 Other long term (current) drug therapy: Secondary | ICD-10-CM | POA: Diagnosis not present

## 2022-01-20 DIAGNOSIS — Z86711 Personal history of pulmonary embolism: Secondary | ICD-10-CM | POA: Diagnosis not present

## 2022-01-20 DIAGNOSIS — Z8673 Personal history of transient ischemic attack (TIA), and cerebral infarction without residual deficits: Secondary | ICD-10-CM | POA: Diagnosis not present

## 2022-01-20 DIAGNOSIS — D259 Leiomyoma of uterus, unspecified: Secondary | ICD-10-CM | POA: Diagnosis not present

## 2022-01-20 DIAGNOSIS — Z86718 Personal history of other venous thrombosis and embolism: Secondary | ICD-10-CM | POA: Diagnosis not present

## 2022-01-20 DIAGNOSIS — I472 Ventricular tachycardia, unspecified: Secondary | ICD-10-CM | POA: Diagnosis not present

## 2022-01-20 LAB — CBC WITH DIFFERENTIAL/PLATELET
Abs Immature Granulocytes: 0.42 10*3/uL — ABNORMAL HIGH (ref 0.00–0.07)
Basophils Absolute: 0.1 10*3/uL (ref 0.0–0.1)
Basophils Relative: 1 %
Eosinophils Absolute: 0.3 10*3/uL (ref 0.0–0.5)
Eosinophils Relative: 3 %
HCT: 30.4 % — ABNORMAL LOW (ref 36.0–46.0)
Hemoglobin: 9.6 g/dL — ABNORMAL LOW (ref 12.0–15.0)
Immature Granulocytes: 5 %
Lymphocytes Relative: 27 %
Lymphs Abs: 2.5 10*3/uL (ref 0.7–4.0)
MCH: 33.6 pg (ref 26.0–34.0)
MCHC: 31.6 g/dL (ref 30.0–36.0)
MCV: 106.3 fL — ABNORMAL HIGH (ref 80.0–100.0)
Monocytes Absolute: 1 10*3/uL (ref 0.1–1.0)
Monocytes Relative: 11 %
Neutro Abs: 5 10*3/uL (ref 1.7–7.7)
Neutrophils Relative %: 53 %
Platelets: 649 10*3/uL — ABNORMAL HIGH (ref 150–400)
RBC: 2.86 MIL/uL — ABNORMAL LOW (ref 3.87–5.11)
RDW: 16 % — ABNORMAL HIGH (ref 11.5–15.5)
WBC: 9.4 10*3/uL (ref 4.0–10.5)
nRBC: 0 % (ref 0.0–0.2)

## 2022-01-20 LAB — COMPREHENSIVE METABOLIC PANEL
ALT: 58 U/L — ABNORMAL HIGH (ref 0–44)
AST: 59 U/L — ABNORMAL HIGH (ref 15–41)
Albumin: 3.3 g/dL — ABNORMAL LOW (ref 3.5–5.0)
Alkaline Phosphatase: 73 U/L (ref 38–126)
Anion gap: 9 (ref 5–15)
BUN: 19 mg/dL (ref 8–23)
CO2: 25 mmol/L (ref 22–32)
Calcium: 9.7 mg/dL (ref 8.9–10.3)
Chloride: 104 mmol/L (ref 98–111)
Creatinine, Ser: 1.13 mg/dL — ABNORMAL HIGH (ref 0.44–1.00)
GFR, Estimated: 52 mL/min — ABNORMAL LOW (ref >60)
Glucose, Bld: 136 mg/dL — ABNORMAL HIGH (ref 70–99)
Potassium: 4.2 mmol/L (ref 3.5–5.1)
Sodium: 138 mmol/L (ref 135–145)
Total Bilirubin: 0.5 mg/dL (ref 0.3–1.2)
Total Protein: 6.8 g/dL (ref 6.5–8.1)

## 2022-01-20 LAB — RETICULOCYTES
Immature Retic Fract: 38.8 % — ABNORMAL HIGH (ref 2.3–15.9)
RBC.: 2.82 MIL/uL — ABNORMAL LOW (ref 3.87–5.11)
Retic Count, Absolute: 214.6 10*3/uL — ABNORMAL HIGH (ref 19.0–186.0)
Retic Ct Pct: 7.6 % — ABNORMAL HIGH (ref 0.4–3.1)

## 2022-01-20 NOTE — Progress Notes (Signed)
Hematology/Oncology Consult note [facility]  Telephone:([phone] [phone]  Patient Care Team: [name], MD as PCP - General (Oncology) [name], MD as Consulting Physician (Hematology and Oncology)   Name of the patient: Andrea Williamson  [id number]  03/07/1951   Date of visit: 01/20/22  Diagnosis- 1. Warm autoimmune hemolytic anemia  Chief complaint/ Reason for visit- post hospital discharge f/u  Heme/Onc history: patient is a 71 year old female with a past medical history significant for hypertension and hyperlipidemia who was admitted to [facility] for symptoms of acute stroke when she presented with right lower extremity weakness and difficulty speaking on 10/01/2021.  CT head showed acute infarct in the left frontal lobe ACA territory.  She underwent cerebral angiogram and underwent thrombectomy with revascularization and left P2 stenting by Dr. Norma Fredrickson and subsequently discharged to rehab.  She was started on aspirin and Brilinta.  During that admission patient had a normal CBC with a white count of 9.3, H&H of 13/37.8 and a platelet count of 245.  Patient was noted to have increasing icterus as well as nausea and dizziness which prompted her visit to the ER.  On admission patient was noted to have a white cell count of 22, H&H of 4.5/13.4 and MCV of 111 with a platelet count of 352.  Total bilirubin was elevated at 6 thereby raising the concern for possible hemolytic anemia.  Patient was started on high-dose steroids and transferred to [facility].  She received 1 dose of rituximab there.  CT chest abdomen and pelvis with contrast did not show any evidence of malignancy but did show a possible Right adnexal mass which will be followed up by GYN as an outpatient.  Noted to have bilateral lower extremity DVT as well for which she is on Eliquis.  She presented with nosebleeds to ER. Hb dropped to 8. No overt GI bleeding. Eliquis was held. Ivc  filter was inserted. New segmental PE. Eliquis therefore restarted. She is also on brilinta    Interval history- she is on 2L of O2 since discharge. Reports fatigue. Appetite is fair. Reports problems with anxiety and insomnia  ECOG PS- 2 Pain scale- 0   Review of systems- Review of Systems  Constitutional:  Positive for malaise/fatigue. Negative for chills, fever and weight loss.  HENT:  Negative for congestion, ear discharge and nosebleeds.   Eyes:  Negative for blurred vision.  Respiratory:  Negative for cough, hemoptysis, sputum production, shortness of breath and wheezing.   Cardiovascular:  Negative for chest pain, palpitations, orthopnea and claudication.  Gastrointestinal:  Negative for abdominal pain, blood in stool, constipation, diarrhea, heartburn, melena, nausea and vomiting.  Genitourinary:  Negative for dysuria, flank pain, frequency, hematuria and urgency.  Musculoskeletal:  Negative for back pain, joint pain and myalgias.  Skin:  Negative for rash.  Neurological:  Negative for dizziness, tingling, focal weakness, seizures, weakness and headaches.  Endo/Heme/Allergies:  Does not bruise/bleed easily.  Psychiatric/Behavioral:  Negative for depression and suicidal ideas. The patient has insomnia.       No Known Allergies   Past Medical History:  Diagnosis Date   Deep vein thrombosis (DVT) ([place])    Hypertension    Stroke [facility])      Past Surgical History:  Procedure Laterality Date   ANKLE SURGERY Left 2008   IR CT HEAD LTD  10/05/2021   IR INTRA CRAN STENT  10/01/2021   IR PERCUTANEOUS ART THROMBECTOMY/INFUSION INTRACRANIAL INC DIAG ANGIO  10/01/2021   IR US GUIDE VASC ACCESS RIGHT  10/01/2021   IVC FILTER INSERTION N/A 01/06/2022   Procedure: IVC FILTER INSERTION;  Surgeon: Algernon Huxley, MD;  Location: [facility];  Service: Cardiovascular;  Laterality: N/A;   RADIOLOGY WITH ANESTHESIA N/A 10/01/2021   Procedure: IR WITH ANESTHESIA;  Surgeon:  Radiologist, Medication, MD;  Location: [facility];  Service: Radiology;  Laterality: N/A;    Social History   Socioeconomic History   Marital status: Married    Spouse name: Not on file   Number of children: 3   Years of education: 12   Highest education level: 12th grade  Occupational History   Occupation: Retired  Tobacco Use   Smoking status: Former    Packs/day: 1.50    Years: 50.00    Pack years: 75.00    Types: Cigarettes    Quit date: 09/2021    Years since quitting: 0.3   Smokeless tobacco: Never  Vaping Use   Vaping Use: Never used  Substance and Sexual Activity   Alcohol use: Not Currently   Drug use: Not Currently   Sexual activity: Not Currently  Other Topics Concern   Not on file  Social History Narrative   October 2022 stopped   Social Determinants of Health   Financial Resource Strain: Low Risk    Difficulty of Paying Living Expenses: Not hard at all  Food Insecurity: No Food Insecurity   Worried About Charity fundraiser in the Last Year: Never true   Arboriculturist in the Last Year: Never true  Transportation Needs: No Transportation Needs   Lack of Transportation (Medical): No   Lack of Transportation (Non-Medical): No  Physical Activity: Insufficiently Active   Days of Exercise per Week: 2 days   Minutes of Exercise per Session: 60 min  Stress: Stress Concern Present   Feeling of Stress : To some extent  Social Connections: Moderately Isolated   Frequency of Communication with Friends and Family: More than three times a week   Frequency of Social Gatherings with Friends and Family: More than three times a week   Attends Religious Services: Never   Marine scientist or Organizations: No   Attends Music therapist: Never   Marital Status: Married  Human resources officer Violence: Not At Risk   Fear of Current or Ex-Partner: No   Emotionally Abused: No   Physically Abused: No   Sexually Abused: No    Family History  Problem  Relation Age of Onset   COPD Mother    Cancer Mother      Current Outpatient Medications:    ALPRAZolam (XANAX) 0.25 MG tablet, Take 1 tablet (0.25 mg total) by mouth at bedtime as needed for anxiety. Take 1/2 tablet at bedtime for difficulty sleeping. (Patient taking differently: Take 0.125 mg by mouth at bedtime.), Disp: 15 tablet, Rfl: 0   amLODipine (NORVASC) 5 MG tablet, Take 1 tablet (5 mg total) by mouth daily., Disp: 90 tablet, Rfl: 1   apixaban (ELIQUIS) 5 MG TABS tablet, Take 1 tablet (5 mg total) by mouth 2 (two) times daily., Disp: 60 tablet, Rfl: 1   atorvastatin (LIPITOR) 80 MG tablet, Take 1 tablet (80 mg total) by mouth daily., Disp: 90 tablet, Rfl: 1   ferrous sulfate 325 (65 FE) MG tablet, Take 325 mg by mouth daily with breakfast., Disp: , Rfl:    folic acid (FOLVITE) 1 MG tablet, Take 1 tablet (1 mg total)  by mouth daily., Disp: , Rfl:    hydrALAZINE (APRESOLINE) 25 MG tablet, Take 1 tablet (25 mg total) by mouth every 8 (eight) hours as needed (SBP >170 or DBP >110)., Disp: , Rfl:    magnesium gluconate (MAGONATE) 500 MG tablet, Take 0.5 tablets (250 mg total) by mouth at bedtime., Disp: 30 tablet, Rfl: 0   metoprolol succinate (TOPROL XL) 25 MG 24 hr tablet, Take 1 tablet (25 mg total) by mouth daily., Disp: 30 tablet, Rfl: 11   pantoprazole (PROTONIX) 40 MG tablet, Take 1 tablet (40 mg total) by mouth daily., Disp: 30 tablet, Rfl: 1   ticagrelor (BRILINTA) 60 MG TABS tablet, Take 1 tablet (60 mg total) by mouth 2 (two) times daily., Disp: 180 tablet, Rfl: 0  Physical exam:  Vitals:   01/20/22 0934  BP: 128/90  Pulse: 93  Resp: 16  Temp: (!) 96.4 F (35.8 C)  SpO2: 99%  Weight: 153 lb 12.8 oz (69.8 kg)   Physical Exam Constitutional:      General: She is not in acute distress.    Comments: On O2  Cardiovascular:     Rate and Rhythm: Normal rate and regular rhythm.     Heart sounds: Normal heart sounds.  Pulmonary:     Effort: Pulmonary effort is normal.      Breath sounds: Normal breath sounds.  Abdominal:     General: Bowel sounds are normal.     Palpations: Abdomen is soft.  Musculoskeletal:     Right lower leg: No edema.     Left lower leg: No edema.  Skin:    General: Skin is warm and dry.  Neurological:     Mental Status: She is alert and oriented to person, place, and time.     CMP Latest Ref Rng & Units 01/20/2022  Glucose 70 - 99 mg/dL 136(H)  BUN 8 - 23 mg/dL 19  Creatinine 0.44 - 1.00 mg/dL 1.13(H)  Sodium 135 - 145 mmol/L 138  Potassium 3.5 - 5.1 mmol/L 4.2  Chloride 98 - 111 mmol/L 104  CO2 22 - 32 mmol/L 25  Calcium 8.9 - 10.3 mg/dL 9.7  Total Protein 6.5 - 8.1 g/dL 6.8  Total Bilirubin 0.3 - 1.2 mg/dL 0.5  Alkaline Phos 38 - 126 U/L 73  AST 15 - 41 U/L 59(H)  ALT 0 - 44 U/L 58(H)   CBC Latest Ref Rng & Units 01/20/2022  WBC 4.0 - 10.5 K/uL 9.4  Hemoglobin 12.0 - 15.0 g/dL 9.6(L)  Hematocrit 36.0 - 46.0 % 30.4(L)  Platelets 150 - 400 K/uL 649(H)    No images are attached to the encounter.  CT CHEST ABDOMEN PELVIS W CONTRAST  Result Date: 01/12/2022 CLINICAL DATA:  Sepsis. EXAM: CT CHEST, ABDOMEN, AND PELVIS WITH CONTRAST TECHNIQUE: Multidetector CT imaging of the chest, abdomen and pelvis was performed following the standard protocol during bolus administration of intravenous contrast. RADIATION DOSE REDUCTION: This exam was performed according to the departmental dose-optimization program which includes automated exposure control, adjustment of the mA and/or kV according to patient size and/or use of iterative reconstruction technique. CONTRAST:  143m OMNIPAQUE IOHEXOL 300 MG/ML  SOLN COMPARISON:  Chest radiograph dated 01/05/2022. CT dated 11/13/2021. FINDINGS: CT CHEST FINDINGS Cardiovascular: There is no cardiomegaly or pericardial effusion. There is coronary vascular calcification. Mild atherosclerotic calcification of the thoracic aorta. No aneurysmal dilatation or dissection. The origins of the great vessels of  the aortic arch appear patent. There is partially occlusive thrombus in the  posterior basal segment of the left lower lobe pulmonary artery branch. No evidence of right heart straining. Mediastinum/Nodes: No hilar or mediastinal adenopathy. Small hilar and mediastinal calcified granuloma. The esophagus and the thyroid gland are grossly unremarkable. No mediastinal fluid collection. Lungs/Pleura: Background of centrilobular emphysema. There are biapical subpleural scarring. No focal consolidation, pleural effusion, or pneumothorax. The central airways are patent. Musculoskeletal: Degenerative changes of the spine. No acute osseous pathology. CT ABDOMEN PELVIS FINDINGS No intra-abdominal free air or free fluid. Hepatobiliary: Fatty liver. No intrahepatic biliary dilatation. The gallbladder is predominantly contracted. No calcified gallstone. Slightly high attenuating content in the gallbladder fundus may represent sludge, noncalcified stone, or focus of adenomyomatosis. No pericholecystic fluid or evidence of acute cholecystitis by CT. Pancreas: Unremarkable. No pancreatic ductal dilatation or surrounding inflammatory changes. Spleen: Normal in size without focal abnormality. Adrenals/Urinary Tract: The adrenal glands unremarkable. Subcentimeter right renal hypodense focus is too small to characterize. There is no hydronephrosis on either side. There is symmetric enhancement and excretion of contrast by both kidneys. The visualized ureters and urinary bladder appear unremarkable. Stomach/Bowel: No bowel obstruction or active inflammation. The appendix is normal. Vascular/Lymphatic: Moderate aortoiliac atherosclerotic disease. An infrarenal IVC filter is noted. The IVC is unremarkable. No portal venous gas. There is no adenopathy. Reproductive: The uterus is anteverted. A 2.5 cm left uterine fibroid noted. There is a 2.7 cm cyst in the right ovary similar to prior CT. This was better evaluated on ultrasound of  11/13/2021. The left ovary is unremarkable. Other: Small fat containing umbilical hernia. Musculoskeletal: Degenerative changes of the spine. No acute osseous pathology. IMPRESSION: 1. Partially occlusive left lower lobe posterior basal segmental pulmonary artery embolism. No evidence of right heart straining. 2. No bowel obstruction. Normal appendix. 3. Fatty liver. 4. Aortic Atherosclerosis (ICD10-I70.0) and Emphysema (ICD10-J43.9). These results were called by telephone at the time of interpretation on 01/12/2022 at 8:32 pm to nurse practitioner, Otilio Miu, who verbally acknowledged these results. Electronically Signed   By: Anner Crete M.D.   On: 01/12/2022 20:37   PERIPHERAL VASCULAR CATHETERIZATION  Result Date: 01/06/2022 See surgical note for result.  DG Chest Port 1 View  Result Date: 01/05/2022 CLINICAL DATA:  Weakness and lightheadedness. EXAM: PORTABLE CHEST 1 VIEW COMPARISON:  CT of the chest on 11/13/2021 FINDINGS: The heart size and mediastinal contours are within normal limits. Mild pulmonary interstitial prominence without overt edema, pleural fluid or pneumothorax. Mild underlying pulmonary interstitial edema cannot be excluded. There is atelectasis at both lung bases, right greater than left. The visualized skeletal structures are unremarkable. IMPRESSION: Mild pulmonary interstitial prominence may represent mild underlying pulmonary interstitial edema. Bibasilar atelectasis, right greater than left. Electronically Signed   By: Aletta Edouard M.D.   On: 01/05/2022 09:07   ECHOCARDIOGRAM COMPLETE  Result Date: 01/10/2022    ECHOCARDIOGRAM REPORT   Patient Name:   Merril PAISLEA HATTON Date of Exam: 01/10/2022 Medical Rec #:  [id number]  Height:       62.0 in Accession #:    [id number] Weight:       161.6 lb Date of Birth:  1950/11/26  BSA:          1.746 m Patient Age:    70 years   BP:           145/75 mmHg Patient Gender: F          HR:           66 bpm. Exam Location:  [facility] Procedure: 2D Echo  Indications:     Fever R50.9  History:         Patient has prior history of Echocardiogram examinations, most                  recent 10/02/2021.  Sonographer:     Kathlen Brunswick RDCS Referring Phys:  [id number] Wyvonnia Dusky Diagnosing Phys: Yolonda Kida MD IMPRESSIONS  1. Left ventricular ejection fraction, by estimation, is 65 to 70%. The left ventricle has normal function. The left ventricle has no regional wall motion abnormalities. Left ventricular diastolic parameters are consistent with Grade I diastolic dysfunction (impaired relaxation).  2. Right ventricular systolic function is normal. The right ventricular size is normal.  3. The mitral valve is grossly normal. No evidence of mitral valve regurgitation.  4. The aortic valve is normal in structure. Aortic valve regurgitation is not visualized. FINDINGS  Left Ventricle: Left ventricular ejection fraction, by estimation, is 65 to 70%. The left ventricle has normal function. The left ventricle has no regional wall motion abnormalities. The left ventricular internal cavity size was normal in size. There is  no left ventricular hypertrophy. Left ventricular diastolic parameters are consistent with Grade I diastolic dysfunction (impaired relaxation). Right Ventricle: The right ventricular size is normal. No increase in right ventricular wall thickness. Right ventricular systolic function is normal. Left Atrium: Left atrial size was normal in size. Right Atrium: Right atrial size was normal in size. Pericardium: There is no evidence of pericardial effusion. Mitral Valve: The mitral valve is grossly normal. No evidence of mitral valve regurgitation. Tricuspid Valve: The tricuspid valve is normal in structure. Tricuspid valve regurgitation is not demonstrated. Aortic Valve: The aortic valve is normal in structure. Aortic valve regurgitation is not visualized. Aortic valve peak gradient measures 10.1 mmHg. Pulmonic Valve: The pulmonic valve was normal in  structure. Pulmonic valve regurgitation is not visualized. Aorta: The ascending aorta was not well visualized. IAS/Shunts: No atrial level shunt detected by color flow Doppler.  LEFT VENTRICLE PLAX 2D LVIDd:         4.60 cm     Diastology LVIDs:         2.90 cm     LV e' medial:    5.22 cm/s LV PW:         1.40 cm     LV E/e' medial:  12.5 LV IVS:        1.30 cm     LV e' lateral:   3.81 cm/s LVOT diam:     1.80 cm     LV E/e' lateral: 17.1 LV SV:         58 LV SV Index:   33 LVOT Area:     2.54 cm  LV Volumes (MOD) LV vol d, MOD A4C: 64.6 ml LV vol s, MOD A4C: 26.2 ml LV SV MOD A4C:     64.6 ml RIGHT VENTRICLE RV Basal diam:  3.30 cm RV S prime:     19.90 cm/s TAPSE (M-mode): 2.2 cm LEFT ATRIUM             Index        RIGHT ATRIUM           Index LA diam:        2.40 cm 1.37 cm/m   RA Area:     17.40 cm LA Vol (A2C):   33.9 ml 19.41 ml/m  RA Volume:   52.30 ml  29.95 ml/m LA Vol (A4C):  42.1 ml 24.11 ml/m LA Biplane Vol: 40.5 ml 23.19 ml/m  AORTIC VALVE                 PULMONIC VALVE AV Area (Vmax): 1.78 cm     PV Vmax:       1.14 m/s AV Vmax:        159.00 cm/s  PV Peak grad:  5.2 mmHg AV Peak Grad:   10.1 mmHg LVOT Vmax:      111.00 cm/s LVOT Vmean:     70.000 cm/s LVOT VTI:       0.229 m  AORTA Ao Root diam: 2.90 cm Ao Asc diam:  3.20 cm MITRAL VALVE               TRICUSPID VALVE MV Area (PHT): 3.46 cm    TV Peak grad:   22.8 mmHg MV Decel Time: 219 msec    TV Vmax:        2.39 m/s MV E velocity: 65.10 cm/s MV A velocity: 81.80 cm/s  SHUNTS MV E/A ratio:  0.80        Systemic VTI:  0.23 m                            Systemic Diam: 1.80 cm Dwayne D Callwood MD Electronically signed by Yolonda Kida MD Signature Date/Time: 01/10/2022/2:25:19 PM    Final    LONG TERM MONITOR (3-14 DAYS)  Result Date: 12/28/2021 Patch Wear Time:  13 days and 13 hours (2023-01-20T15:50:58-0500 to 2023-02-03T05:28:36-0500) Patient had a min HR of 42 bpm, max HR of 164 bpm, and avg HR of 61 bpm. Predominant underlying  rhythm was Sinus Rhythm. 1 run of Ventricular Tachycardia occurred lasting 12 beats with a max rate of 164 bpm (avg 120 bpm). 26 Supraventricular Tachycardia runs occurred, the run with the fastest interval lasting 6 beats with a max rate of 154 bpm, the longest lasting 28.3 secs with an avg rate of 135 bpm. Some episodes of Supraventricular Tachycardia may be possible Atrial Tachycardia with variable block. Isolated SVEs were rare (<1.0%), SVE Couplets were rare (<1.0%), and SVE Triplets were rare (<1.0%). Isolated VEs were rare (<1.0%), VE Couplets were rare (<1.0%), and no VE Triplets were present. Ventricular Bigeminy and Trigeminy were present. Paroxysmal SVT and VT noted.  No evidence for atrial fibrillation.    Assessment and plan- Patient is a 71 y.o. female with h/o recent stroke and warm autoimmune hemolytic anemia. Also h/o B/L LE DVT and PE here for post hospital discharge f/u  Recent PE and DVT- will need to remain on eliquis. No clinical signs/ symptoms of bleeding. Also has a IVC filter in place  Warm autoimmune hemolytic anemia- suspect reticulocytosis suggests marrow recovery rather than hemolysis. Haptoglobin pending. Total bilirubin normal. S/p 4 cycles of weekly rituxan. Continue to monitor  Adnexal mass- being followed by gyn onc. Given recent PE, any surgical inervention will have to wait   Labs and see me in 1 month    Visit Diagnosis 1. Acute deep vein thrombosis (DVT) of both lower extremities, unspecified vein (HCC)   2. Warm autoimmune hemolytic anemia ([place])      Dr. Randa Evens, MD, MPH [facility] [phone] 01/20/2022 12:35 PM

## 2022-01-20 NOTE — Progress Notes (Signed)
Per husband pt is very weak,fatigued, and dizzy. Not able to walk more than 10 feet.  ?

## 2022-01-21 ENCOUNTER — Ambulatory Visit: Payer: Medicare Other

## 2022-01-21 ENCOUNTER — Ambulatory Visit: Payer: Medicare Other | Admitting: Occupational Therapy

## 2022-01-21 ENCOUNTER — Other Ambulatory Visit: Payer: Self-pay | Admitting: *Deleted

## 2022-01-21 LAB — HAPTOGLOBIN: Haptoglobin: 330 mg/dL (ref 37–355)

## 2022-01-21 MED ORDER — ALPRAZOLAM 0.25 MG PO TABS: 0.2500 mg | ORAL_TABLET | Freq: Every evening | ORAL | 1 refills | Status: DC

## 2022-01-21 MED ORDER — PANTOPRAZOLE SODIUM 40 MG PO TBEC: 40.0000 mg | DELAYED_RELEASE_TABLET | Freq: Every day | ORAL | 1 refills | Status: DC

## 2022-01-21 NOTE — Therapy (Unsigned)
[place] ?[facility] ?[address], [address] ?[place], [place], [address] ?Phone: [phone]   Fax:  [phone] ? ?Occupational Therapy Screen ? ?Patient Details  ?Name: Andrea Williamson ?MRN: [id number] ?Date of Birth: [birth year] ?No data recorded ? ?Encounter Date: 01/20/2022 ? ? OT End of Session - 01/21/22 1734   ? ? Visit Number 0   ? ?  ?  ? ?  ? ? ?Past Medical History:  ?Diagnosis Date  ? Deep vein thrombosis (DVT) (HCC)   ? Hypertension   ? Stroke [facility])   ? ? ?Past Surgical History:  ?Procedure Laterality Date  ? ANKLE SURGERY Left 2008  ? IR CT HEAD LTD  10/05/2021  ? IR INTRA CRAN STENT  10/01/2021  ? IR PERCUTANEOUS ART THROMBECTOMY/INFUSION INTRACRANIAL INC DIAG ANGIO  10/01/2021  ? IR US GUIDE VASC ACCESS RIGHT  10/01/2021  ? IVC FILTER INSERTION N/A 01/06/2022  ? Procedure: IVC FILTER INSERTION;  Surgeon: Algernon Huxley, MD;  Location: [facility];  Service: Cardiovascular;  Laterality: N/A;  ? RADIOLOGY WITH ANESTHESIA N/A 10/01/2021  ? Procedure: IR WITH ANESTHESIA;  Surgeon: Radiologist, Medication, MD;  Location: [facility];  Service: Radiology;  Laterality: N/A;  ? ? ?There were no vitals filed for this visit. ? ? Subjective Assessment - 01/21/22 1733   ? ? Subjective  Patient report she had therapy in the past and outpatient-feels like every time she does therapy think she gets sick again-go to the hospital and has to start all over again   ? ?  ?  ? ?  ? ? ? ? ? ? ?  ?Assessment and plan DR Janese Banks 01/20/22 ?- Patient is a 71 y.o. female with h/o recent stroke and warm autoimmune hemolytic anemia. Also h/o B/L LE DVT and PE here for post hospital discharge f/u ?  ?Recent PE and DVT- will need to remain on eliquis. No clinical signs/ symptoms of bleeding. Also has a IVC filter in place ?  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 Haptoglobin pending. Total bilirubin normal. S/p 4 cycles of weekly  rituxan. Continue to monitor ?  ?Adnexal mass- being followed by gyn onc. Given recent PE, any surgical inervention will have to wait ?  ?  ?Labs and see me in 1 month ?  ?  ?Visit Diagnosis ?1. Acute deep vein thrombosis (DVT) of both lower extremities, unspecified vein (HCC)   ?2. Warm autoimmune hemolytic anemia (HCC)   ?  ? ? ? ? ?OT SCREEN 01/20/22: ?Patient seen on request of Dr. Janese Banks for OT screen to assess appropriate referral for outpatient or home health ?Patient in wheelchair on 2 L of oxygen-husband present ?Patient report she had in the past outpatient PT downstairs, prefer outpatient do not want home health ?BERG balance test -patient score 47 out of 56 putting her at low risk for falling ?The patient needed several rest breaks ?Patient reports she stays most of the time in bed-husband brings food to bed ?Discussed with patient that she needs to do about 150 minutes of exercise a week ?She can break that up in shoulder.  During the day.  She can do 4 x 5 to 7 minutes ?Patient referred to outpatient PT ?Encourage patient to do her home exercises daily as recommended by outpatient PT ?To break it up and shoulder sessions ?Patient to get up daily out of bed can do light folding laundry or kitchen  activity ?Report no loose carpets in hallway ,do have stairs into the house ,but reports no trouble with that  ?Has shower chair but do not use it - recommend for pt to use it -and rest before she gets tired. ?Do not rush and keep bathroom not to hot ? ? ? ? ? ? ? ? ? ? ? ? ? ? ? ? ? ? ? ? ? ? ? ?  ?  ?  ? ? ?Visit Diagnosis: ?Muscle weakness (generalized) ? ? ? ?Problem List ?Patient Active Problem List  ? Diagnosis Date Noted  ? Fever 01/05/2022  ? History of stroke 11/13/2021  ? Essential hypertension 11/13/2021  ? DVT (deep venous thrombosis) ([place]) 11/13/2021  ? Adnexal mass 11/13/2021  ? Warm autoimmune hemolytic anemia (HCC)   ? Symptomatic anemia 11/12/2021  ? HTN (hypertension) 10/19/2021  ? Constipation  10/19/2021  ? Elevated cholesterol 10/15/2021  ? Acute renal injury ([place]) 10/15/2021  ? Acute ischemic left ACA stroke ([place]) 10/07/2021  ? 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 Tobacco use 01/24/2018  ? ? ?Rosalyn Gess, OTR/L,CLT ?01/21/2022, 5:37 PM ? ?[place] ?[facility] ?[address], [address] ?[place], [place], [address] ?Phone: [phone]   Fax:  [phone] ? ?Name: Andrea Williamson ?MRN: [id number] ?Date of Birth: 1951/01/26 ? ?

## 2022-01-22 ENCOUNTER — Other Ambulatory Visit: Payer: Self-pay | Admitting: *Deleted

## 2022-01-22 DIAGNOSIS — D5911 Warm autoimmune hemolytic anemia: Secondary | ICD-10-CM

## 2022-01-22 DIAGNOSIS — M6281 Muscle weakness (generalized): Secondary | ICD-10-CM

## 2022-01-26 ENCOUNTER — Encounter: Payer: Medicare Other | Admitting: Obstetrics and Gynecology

## 2022-01-26 ENCOUNTER — Ambulatory Visit: Payer: Medicare Other | Attending: Physical Medicine and Rehabilitation

## 2022-01-26 DIAGNOSIS — R2689 Other abnormalities of gait and mobility: Secondary | ICD-10-CM | POA: Insufficient documentation

## 2022-01-26 DIAGNOSIS — M6281 Muscle weakness (generalized): Secondary | ICD-10-CM | POA: Insufficient documentation

## 2022-01-26 DIAGNOSIS — R278 Other lack of coordination: Secondary | ICD-10-CM | POA: Insufficient documentation

## 2022-01-27 ENCOUNTER — Encounter: Payer: Self-pay | Admitting: Oncology

## 2022-01-28 ENCOUNTER — Telehealth: Payer: Self-pay | Admitting: *Deleted

## 2022-01-28 ENCOUNTER — Other Ambulatory Visit: Payer: Self-pay

## 2022-01-28 ENCOUNTER — Other Ambulatory Visit: Payer: Self-pay | Admitting: *Deleted

## 2022-01-28 ENCOUNTER — Ambulatory Visit: Payer: Medicare Other

## 2022-01-28 ENCOUNTER — Encounter: Payer: Self-pay | Admitting: Internal Medicine

## 2022-01-28 DIAGNOSIS — M6281 Muscle weakness (generalized): Secondary | ICD-10-CM

## 2022-01-28 DIAGNOSIS — D5911 Warm autoimmune hemolytic anemia: Secondary | ICD-10-CM

## 2022-01-28 MED ORDER — APIXABAN 5 MG PO TABS: 5.0000 mg | ORAL_TABLET | Freq: Two times a day (BID) | ORAL | 1 refills | Status: DC

## 2022-01-28 NOTE — Telephone Encounter (Signed)
Patient called stating she had been in the hospital and is back home now, but wants to go back to out patient rehab and needs a release to be able to go back and is asking Dr Janese Banks to give her that release ?

## 2022-01-28 NOTE — Telephone Encounter (Signed)
Spoke to [place] in out-patient rehab in regards to pt restarting her PT sessions due to being discahrged from hospital. Baker Janus explained that I will need to enter new orders in order for pt to be scheduled once again. I have added the new order in and will reach out to pt to inform that PT should be reaching out in order to continue sessions.  ?

## 2022-01-28 NOTE — Telephone Encounter (Signed)
Spoke to [place] in out-patient rehab in regards to pt restarting her PT sessions due to being discahrged from hospital. Baker Janus explained that I will need to enter new orders in order for pt to be scheduled once again. I have added the new order in and will reach out to pt to inform that PT should be reaching out in order to continue sessions.  ?

## 2022-01-28 NOTE — Progress Notes (Signed)
Spoke to [place] in outpatient rehab and she advised to put a new order for pt in order for the pt to be seen again. Have put new order in and will inform pt that someone should be reaching out soon.  ?

## 2022-02-02 ENCOUNTER — Encounter: Payer: Self-pay | Admitting: Internal Medicine

## 2022-02-02 ENCOUNTER — Ambulatory Visit: Payer: Medicare Other

## 2022-02-02 ENCOUNTER — Ambulatory Visit (INDEPENDENT_AMBULATORY_CARE_PROVIDER_SITE_OTHER): Payer: Medicare Other | Admitting: Internal Medicine

## 2022-02-02 ENCOUNTER — Other Ambulatory Visit: Payer: Self-pay

## 2022-02-02 ENCOUNTER — Ambulatory Visit: Payer: Medicare Other | Admitting: Internal Medicine

## 2022-02-02 VITALS — BP 120/87 | HR 69 | Ht 62.0 in | Wt 154.6 lb

## 2022-02-02 DIAGNOSIS — R2689 Other abnormalities of gait and mobility: Secondary | ICD-10-CM

## 2022-02-02 DIAGNOSIS — M6281 Muscle weakness (generalized): Secondary | ICD-10-CM | POA: Diagnosis not present

## 2022-02-02 DIAGNOSIS — E7800 Pure hypercholesterolemia, unspecified: Secondary | ICD-10-CM

## 2022-02-02 DIAGNOSIS — D649 Anemia, unspecified: Secondary | ICD-10-CM | POA: Diagnosis not present

## 2022-02-02 DIAGNOSIS — Z720 Tobacco use: Secondary | ICD-10-CM | POA: Diagnosis not present

## 2022-02-02 DIAGNOSIS — R278 Other lack of coordination: Secondary | ICD-10-CM

## 2022-02-02 DIAGNOSIS — I10 Essential (primary) hypertension: Secondary | ICD-10-CM | POA: Diagnosis not present

## 2022-02-02 DIAGNOSIS — I63322 Cerebral infarction due to thrombosis of left anterior cerebral artery: Secondary | ICD-10-CM

## 2022-02-02 MED ORDER — TICAGRELOR 60 MG PO TABS: 60.0000 mg | ORAL_TABLET | Freq: Two times a day (BID) | ORAL | 2 refills | Status: DC

## 2022-02-02 NOTE — Progress Notes (Signed)
Established Patient Office Visit  Subjective:  Patient ID: Andrea Williamson, female    DOB: [birth year]  Age: 71 y.o. MRN: [id number]  CC:  Chief Complaint  Patient presents with   Follow-up    HPI  Andrea Williamson presents for general check  Past Medical History:  Diagnosis Date   Deep vein thrombosis (DVT) (HCC)    Hypertension    Stroke [facility])     Past Surgical History:  Procedure Laterality Date   ANKLE SURGERY Left 2008   IR CT HEAD LTD  10/05/2021   IR INTRA CRAN STENT  10/01/2021   IR PERCUTANEOUS ART THROMBECTOMY/INFUSION INTRACRANIAL INC DIAG ANGIO  10/01/2021   IR US GUIDE VASC ACCESS RIGHT  10/01/2021   IVC FILTER INSERTION N/A 01/06/2022   Procedure: IVC FILTER INSERTION;  Surgeon: Annice Needy, MD;  Location: [facility] INVASIVE CV LAB;  Service: Cardiovascular;  Laterality: N/A;   RADIOLOGY WITH ANESTHESIA N/A 10/01/2021   Procedure: IR WITH ANESTHESIA;  Surgeon: Radiologist, Medication, MD;  Location: [facility] OR;  Service: Radiology;  Laterality: N/A;    Family History  Problem Relation Age of Onset   COPD Mother    Cancer Mother     Social History   Socioeconomic History   Marital status: Married    Spouse name: Not on file   Number of children: 3   Years of education: 12   Highest education level: 12th grade  Occupational History   Occupation: Retired  Tobacco Use   Smoking status: Former    Packs/day: 1.50    Years: 50.00    Pack years: 75.00    Types: Cigarettes    Quit date: 09/2021    Years since quitting: 0.3   Smokeless tobacco: Never  Vaping Use   Vaping Use: Never used  Substance and Sexual Activity   Alcohol use: Not Currently   Drug use: Not Currently   Sexual activity: Not Currently  Other Topics Concern   Not on file  Social History Narrative   October 2022 stopped   Social Determinants of Health   Financial Resource Strain: Low Risk    Difficulty of Paying Living Expenses: Not hard at all  Food Insecurity: No Food Insecurity   Worried  About Programme researcher, broadcasting/film/video in the Last Year: Never true   Barista in the Last Year: Never true  Transportation Needs: No Transportation Needs   Lack of Transportation (Medical): No   Lack of Transportation (Non-Medical): No  Physical Activity: Insufficiently Active   Days of Exercise per Week: 2 days   Minutes of Exercise per Session: 60 min  Stress: Stress Concern Present   Feeling of Stress : To some extent  Social Connections: Moderately Isolated   Frequency of Communication with Friends and Family: More than three times a week   Frequency of Social Gatherings with Friends and Family: More than three times a week   Attends Religious Services: Never   Database administrator or Organizations: No   Attends Engineer, structural: Never   Marital Status: Married  Catering manager Violence: Not At Risk   Fear of Current or Ex-Partner: No   Emotionally Abused: No   Physically Abused: No   Sexually Abused: No     Current Outpatient Medications:    ALPRAZolam (XANAX) 0.25 MG tablet, Take 1 tablet (0.25 mg total) by mouth at bedtime., Disp: 30 tablet, Rfl: 1   amLODipine (NORVASC) 5 MG tablet, Take 1  tablet (5 mg total) by mouth daily., Disp: 90 tablet, Rfl: 1   apixaban (ELIQUIS) 5 MG TABS tablet, Take 1 tablet (5 mg total) by mouth 2 (two) times daily., Disp: 60 tablet, Rfl: 1   atorvastatin (LIPITOR) 80 MG tablet, Take 1 tablet (80 mg total) by mouth daily., Disp: 90 tablet, Rfl: 1   ferrous sulfate 325 (65 FE) MG tablet, Take 325 mg by mouth daily with breakfast., Disp: , Rfl:    folic acid (FOLVITE) 1 MG tablet, Take 1 tablet (1 mg total) by mouth daily., Disp: , Rfl:    hydrALAZINE (APRESOLINE) 25 MG tablet, Take 1 tablet (25 mg total) by mouth every 8 (eight) hours as needed (SBP >170 or DBP >110)., Disp: , Rfl:    magnesium gluconate (MAGONATE) 500 MG tablet, Take 0.5 tablets (250 mg total) by mouth at bedtime., Disp: 30 tablet, Rfl: 0   metoprolol succinate (TOPROL  XL) 25 MG 24 hr tablet, Take 1 tablet (25 mg total) by mouth daily., Disp: 30 tablet, Rfl: 11   pantoprazole (PROTONIX) 40 MG tablet, Take 1 tablet (40 mg total) by mouth daily., Disp: 90 tablet, Rfl: 1   ticagrelor (BRILINTA) 60 MG TABS tablet, Take 1 tablet (60 mg total) by mouth 2 (two) times daily., Disp: 180 tablet, Rfl: 0   No Known Allergies  ROS Review of Systems  Constitutional: Negative.   HENT: Negative.    Eyes: Negative.   Respiratory: Negative.    Cardiovascular: Negative.   Gastrointestinal: Negative.   Endocrine: Negative.   Genitourinary: Negative.   Musculoskeletal: Negative.   Skin: Negative.   Allergic/Immunologic: Negative.   Neurological: Negative.   Hematological: Negative.   Psychiatric/Behavioral: Negative.    All other systems reviewed and are negative.    Objective:    Physical Exam Vitals reviewed.  Constitutional:      Appearance: Normal appearance.  HENT:     Mouth/Throat:     Mouth: Mucous membranes are moist.  Eyes:     Pupils: Pupils are equal, round, and reactive to light.  Neck:     Vascular: No carotid bruit.  Cardiovascular:     Rate and Rhythm: Normal rate and regular rhythm.     Pulses: Normal pulses.     Heart sounds: Normal heart sounds.  Pulmonary:     Effort: Pulmonary effort is normal.     Breath sounds: Normal breath sounds.  Abdominal:     General: Bowel sounds are normal.     Palpations: Abdomen is soft. There is no hepatomegaly, splenomegaly or mass.     Tenderness: There is no abdominal tenderness.     Hernia: No hernia is present.  Musculoskeletal:        General: No tenderness.     Cervical back: Neck supple.     Right lower leg: No edema.     Left lower leg: No edema.  Skin:    Findings: No rash.  Neurological:     Mental Status: She is alert and oriented to person, place, and time.     Motor: No weakness.  Psychiatric:        Mood and Affect: Mood and affect normal.        Behavior: Behavior normal.     BP 120/87   Pulse 69   Ht 5\' 2"  (1.575 m)   Wt 154 lb 9.6 oz (70.1 kg)   BMI 28.28 kg/m  Wt Readings from Last 3 Encounters:  02/02/22 154 lb 9.6 oz (  70.1 kg)  01/20/22 153 lb 12.8 oz (69.8 kg)  01/04/22 161 lb 9.6 oz (73.3 kg)     Health Maintenance Due  Topic Date Due   COVID-19 Vaccine (1) Never done   TETANUS/TDAP  Never done   COLONOSCOPY (Pts 45-80yrs Insurance coverage will need to be confirmed)  Never done   MAMMOGRAM  Never done   Zoster Vaccines- Shingrix (1 of 2) Never done   Pneumonia Vaccine 38+ Years old (1 - PCV) Never done   DEXA SCAN  Never done   INFLUENZA VACCINE  Never done    There are no preventive care reminders to display for this patient.  Lab Results  Component Value Date   TSH 1.613 01/06/2022   Lab Results  Component Value Date   WBC 9.4 01/20/2022   HGB 9.6 (L) 01/20/2022   HCT 30.4 (L) 01/20/2022   MCV 106.3 (H) 01/20/2022   PLT 649 (H) 01/20/2022   Lab Results  Component Value Date   NA 138 01/20/2022   K 4.2 01/20/2022   CO2 25 01/20/2022   GLUCOSE 136 (H) 01/20/2022   BUN 19 01/20/2022   CREATININE 1.13 (H) 01/20/2022   BILITOT 0.5 01/20/2022   ALKPHOS 73 01/20/2022   AST 59 (H) 01/20/2022   ALT 58 (H) 01/20/2022   PROT 6.8 01/20/2022   ALBUMIN 3.3 (L) 01/20/2022   CALCIUM 9.7 01/20/2022   ANIONGAP 9 01/20/2022   Lab Results  Component Value Date   CHOL 317 (H) 10/02/2021   Lab Results  Component Value Date   HDL 49 10/02/2021   Lab Results  Component Value Date   LDLCALC 232 (H) 10/02/2021   Lab Results  Component Value Date   TRIG 182 (H) 10/02/2021   Lab Results  Component Value Date   CHOLHDL 6.5 10/02/2021   Lab Results  Component Value Date   HGBA1C 4.5 (L) 11/13/2021      Assessment & Plan:   Problem List Items Addressed This Visit       Cardiovascular and Mediastinum   Stroke (cerebrum) (HCC) - Primary    Patient denies any history of headache there is no focal neurological sign at  the present time grossly, patient can walk independently get up from the chair speech is normal eyesight is intact      Essential hypertension     Patient denies any chest pain or shortness of breath there is no history of palpitation or paroxysmal nocturnal dyspnea   patient was advised to follow low-salt low-cholesterol diet    ideally I want to keep systolic blood pressure below 259 mmHg, patient was asked to check blood pressure one times a week and give me a report on that.  Patient will be follow-up in 3 months  or earlier as needed, patient will call me back for any change in the cardiovascular symptoms Patient was advised to buy a book from local bookstore concerning blood pressure and read several chapters  every day.  This will be supplemented by some of the material we will give him from the office.  Patient should also utilize other resources like YouTube and Internet to learn more about the blood pressure and the diet.        Other   Elevated cholesterol    Hypercholesterolemia  I advised the patient to follow Mediterranean diet This diet is rich in fruits vegetables and whole grain, and This diet is also rich in fish and lean meat Patient should also eat  a handful of almonds or walnuts daily Recent heart study indicated that average follow-up on this kind of diet reduces the cardiovascular mortality by 50 to 70%==      Symptomatic anemia   Tobacco use    - I instructed the patient to stop smoking and provided them with smoking cessation materials.  - I informed the patient that smoking puts them at increased risk for cancer, COPD, hypertension, and more.  - Informed the patient to seek help if they begin to have trouble breathing, develop chest pain, start to cough up blood, feel faint, or pass out.     Patient is at home oxygen therapy 2 L/min at night daytime saturation is 96 without oxygen  No orders of the defined types were placed in this encounter.   Follow-up:  No follow-ups on file.    Corky Downs, MD

## 2022-02-02 NOTE — Therapy (Signed)
[place] ?[facility] ?[place], [place], [address] ?Phone: [phone]   Fax:  [phone] ? ?Physical Therapy Treatment/RECERT ? ?Patient Details  ?Name: Andrea Williamson ?MRN: [id number] ?Date of Birth: 08-Aug-1951 ?Referring Provider (PT): Reesa Chew, PA-C ? ? ?Encounter Date: 02/02/2022 ? ? PT End of Session - 02/03/22 2706   ? ? Visit Number 17   ? Number of Visits 25   ? Date for PT Re-Evaluation 03/30/22   ? PT Start Time 1147   ? PT Stop Time 1230   ? PT Time Calculation (min) 43 min   ? Equipment Utilized During Treatment Gait belt   ? Activity Tolerance Patient tolerated treatment well   ? Behavior During Therapy [facility] for tasks assessed/performed   ? ?  ?  ? ?  ? ? ?Past Medical History:  ?Diagnosis Date  ? Deep vein thrombosis (DVT) (HCC)   ? Hypertension   ? Stroke [facility])   ? ? ?Past Surgical History:  ?Procedure Laterality Date  ? ANKLE SURGERY Left 2008  ? IR CT HEAD LTD  10/05/2021  ? IR INTRA CRAN STENT  10/01/2021  ? IR PERCUTANEOUS ART THROMBECTOMY/INFUSION INTRACRANIAL INC DIAG ANGIO  10/01/2021  ? IR US GUIDE VASC ACCESS RIGHT  10/01/2021  ? IVC FILTER INSERTION N/A 01/06/2022  ? Procedure: IVC FILTER INSERTION;  Surgeon: Algernon Huxley, MD;  Location: [facility];  Service: Cardiovascular;  Laterality: N/A;  ? RADIOLOGY WITH ANESTHESIA N/A 10/01/2021  ? Procedure: IR WITH ANESTHESIA;  Surgeon: Radiologist, Medication, MD;  Location: [facility];  Service: Radiology;  Laterality: N/A;  ? ? ?There were no vitals filed for this visit. ? ? ? ? Subjective Assessment - 02/02/22 1147   ? ? Subjective Pt returns to PT session following recent hospitalization on 2/21. Pt states, ?I lost everything. I don?t know what to do.? Pt?s DIL is with pt for appointment. DIL reports pt was taken to ER following a significant nosebleed. Pt was also spitting up blood at the time. During ED stay pt reports they found a clot in her lungs and that her SPO2% had dropped  into the 80s. She was placed on oxygen at the time and was discharged with a portable oxygen tank to use as needed if she feels SOB. Pt reports she is now only using it at night. She reports no current SOB. Pt is fearful to walk outside because she feels she is not able to easily take her oxygen tank with her. The pt has no pain currently. Pt reports she has continued to perform her HEP.   ? Patient is accompained by: Family member   ? Pertinent History Pt is a 71 y/o female presenting with R LE strength and balance deficits. Pt is s/p L hemisphere CVA, admitted to the ED 10/01/21. Imaging included MRI scans on 10/02/21 yielding "Redemonstrated infarcts in the left ACA and ACA/PCA watershed  territory, with one new area of restricted diffusion in the medial right parietal lobe, likely an additional acute infarct, status post  A2 stenting. A small amount of associated petechial hemorrhage is  noted within the medial left frontal lobe, without significant  hemorrhagic transformation." per Dr. Merilyn Baba, MD. Pt has PMH that includes HLD.   ? Limitations Walking;Standing   ? How long can you sit comfortably? "a long time"   ? How long can you stand comfortably? 30 minutes to an hour   ? How long  can you walk comfortably? 7 minutes   ? Diagnostic tests CT (10/01/21) - 1. The right common femoral artery has normal caliber, adequate for   vascular access.   2. 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   3. Intracranial atherosclerotic disease with multifocal areas of   mild stenosis in the left ACA and MCA vascular trees. MRI HEAD WITHOUT CONTRAST (10/12/21) - "Redemonstrated infarcts in the left ACA and ACA/PCA watershed  territory, with one new area of restricted diffusion in the medial right parietal lobe, likely an additional acute infarct, status post  A2 stenting. A small amount of associated petechial hemorrhage is  noted within the medial left frontal lobe, without significant  hemorrhagic transformation."    ? Patient Stated Goals wants to be able to go back to bowling in january, taking care of her dog and cat   ? Currently in Pain? No/denies   ? ?  ?  ? ?  ? ? ? ?INTERVENTIONS -  ?Vitals at beginning of session: ?Seated: SPO2% 98%, HR 74 bpm ? ?FOTO: 47  ? ?BERG: 55/56  ? ?5xSTS: 19 sec hands-free  ? ?10MWT - 0.95 m/s no AD, SPO2 following was 97%, no SOB ? ?6MWT - 1066 ft, SPO2% at end of test 94-98 within 30 sec ? ?MMT: generally 5/5 B, only impairment found in R hamstrings 4/5 ? ?Instructed pt in goal reassessment and POC. Pt verbalized understanding. See goal section for details.  ? ? ?Pt educated throughout session about proper posture and technique with exercises. Improved exercise technique, movement at target joints, use of target muscles after min to mod verbal, visual, tactile cues. ? ? ? ? ? PT Education - 02/03/22 0957   ? ? Education Details goals, POC   ? Person(s) Educated Patient   ? Methods Explanation;Demonstration;Verbal cues   ? Comprehension Verbalized understanding;Returned demonstration;Verbal cues required;Need further instruction   ? ?  ?  ? ?  ? ? ? PT Short Term Goals - 02/03/22 0959   ? ?  ? PT SHORT TERM GOAL #1  ? Title Pt will be independent with HEP in order to improve strength and balance in order to decrease fall risk and improve function at home and work.   ? Baseline 10/21/21: HEP deferred to next visit; 1/17: Pt performing progressive walking program, pt to increase time ambulated by 5 minutes; 3/21: pt indep. with HEP   ? Time 6   ? Period Weeks   ? Status Achieved   ? Target Date 01/12/22   ? ?  ?  ? ?  ? ? ? ? PT Long Term Goals - 02/03/22 0959   ? ?  ? PT LONG TERM GOAL #1  ? Title Pt will improve FOTO score to >71% to show improved functional mobility with ADLs   ? Baseline 10/21/21: 58%; 12/01/2021: 68%; 3/21: 69   ? Time 8   ? Period Weeks   ? Status Partially Met   ? Target Date 03/30/22   ?  ? PT LONG TERM GOAL #2  ? Title Pt will improve BERG by at least 3 points in order  to demonstrate clinically significant improvement in balance.   ? Baseline 10/21/21: 44/56; 1/17: 54/56; 3/21: 55/56   ? Time 12   ? Period Weeks   ? Status Achieved   ? Target Date 01/13/22   ?  ? PT LONG TERM GOAL #3  ? Title Pt will decrease 5TSTS by at least  3 seconds in order to demonstrate clinically significant improvement in LE strength.   ? Baseline 10/21/21: 16.3 sec without UE support; 1/17: 13 sec hands-free; 3/21: 19 sec hands free (previously achieved)   ? Time 12   ? Period Weeks   ? Status Achieved   ? Target Date 01/13/22   ?  ? PT LONG TERM GOAL #4  ? Title Pt will improve 10MWT gait speed to >1.0 m/s with least restrictive AD to show clinically significant improvement and decrease risk of falls.   ? Baseline 10/21/21: 0.82 m/s with 2WW and CGA; 1/17 1 m/s without AD; 3/21: 0.95 m/s   ? Time 8   ? Period Weeks   ? Status Partially Met   ? Target Date 03/30/22   ?  ? PT LONG TERM GOAL #5  ? Title Pt will increase 6MWT by at least 34m(1674f in order to demonstrate clinically significant improvement in cardiopulmonary endurance and community ambulation   ? Baseline 10/21/21: deferred to next visit; 10/27/2021= 790 feet with front wheeled walker; 1/17: 1065 ft no AD; 3/21: 1066 ft no AD   ? Time 12   ? Period Weeks   ? Status Achieved   ? Target Date 01/13/22   ?  ? PT LONG TERM GOAL #6  ? Title Pt will report ability to ambulate without her RW outside for at least 30 minutes and without steadiness in order to return patient to PLOF   ? Baseline 1/17: new; 3/21: since recent hospitalization pt fearful of ambulating outside for long distancess. Prior to recent ED admission pt was ambulating outside >30 min   ? Time 12   ? Period Weeks   ? Status On-going   ? Target Date 02/23/22   ?  ? PT LONG TERM GOAL #7  ? Title Pt will increase 6MWT by at least 200 ft in order to demonstrate clinically significant improvement in cardiopulmonary endurance and community ambulation   ? Baseline 1/17: 1065 ft (achieved  previous 6MWT goal, can now be progressed further toward PLOF); 3/21: 1066 ft   ? Time 12   ? Period Weeks   ? Status On-going   ? Target Date 02/23/22   ? ?  ?  ? ?  ? ? ? ? ? ? ? ? Plan - 02/03/22 1003

## 2022-02-02 NOTE — Assessment & Plan Note (Signed)
Patient denies any history of headache there is no focal neurological sign at the present time grossly, patient can walk independently get up from the chair speech is normal eyesight is intact ?

## 2022-02-02 NOTE — Assessment & Plan Note (Signed)
-   I instructed the patient to stop smoking and provided them with smoking cessation materials.  - I informed the patient that smoking puts them at increased risk for cancer, COPD, hypertension, and more.  - Informed the patient to seek help if they begin to have trouble breathing, develop chest pain, start to cough up blood, feel faint, or pass out.  

## 2022-02-02 NOTE — Assessment & Plan Note (Signed)

## 2022-02-02 NOTE — Assessment & Plan Note (Signed)
Hypercholesterolemia  I advised the patient to follow Mediterranean diet This diet is rich in fruits vegetables and whole grain, and This diet is also rich in fish and lean meat Patient should also eat a handful of almonds or walnuts daily Recent heart study indicated that average follow-up on this kind of diet reduces the cardiovascular mortality by 50 to 70%== 

## 2022-02-04 ENCOUNTER — Other Ambulatory Visit: Payer: Self-pay

## 2022-02-04 ENCOUNTER — Ambulatory Visit: Payer: Medicare Other

## 2022-02-04 ENCOUNTER — Ambulatory Visit: Payer: Medicare Other | Admitting: Cardiology

## 2022-02-04 ENCOUNTER — Encounter: Payer: Self-pay | Admitting: Cardiology

## 2022-02-04 ENCOUNTER — Other Ambulatory Visit
Admission: RE | Admit: 2022-02-04 | Discharge: 2022-02-04 | Disposition: A | Discharge status: Home / Self Care | Payer: Medicare Other | Source: Ambulatory Visit | Attending: Cardiology | Admitting: Cardiology

## 2022-02-04 VITALS — BP 142/80 | HR 70 | Ht 62.0 in | Wt 156.0 lb

## 2022-02-04 DIAGNOSIS — I639 Cerebral infarction, unspecified: Secondary | ICD-10-CM

## 2022-02-04 DIAGNOSIS — E7800 Pure hypercholesterolemia, unspecified: Secondary | ICD-10-CM | POA: Insufficient documentation

## 2022-02-04 DIAGNOSIS — I10 Essential (primary) hypertension: Secondary | ICD-10-CM | POA: Diagnosis not present

## 2022-02-04 DIAGNOSIS — I82403 Acute embolism and thrombosis of unspecified deep veins of lower extremity, bilateral: Secondary | ICD-10-CM

## 2022-02-04 LAB — LIPID PANEL
Cholesterol: 209 mg/dL — ABNORMAL HIGH (ref 0–200)
HDL: 52 mg/dL (ref >40)
LDL Cholesterol: 118 mg/dL — ABNORMAL HIGH (ref 0–99)
Total CHOL/HDL Ratio: 4 RATIO
Triglycerides: 197 mg/dL — ABNORMAL HIGH (ref <150)
VLDL: 39 mg/dL (ref 0–40)

## 2022-02-04 NOTE — Progress Notes (Signed)
?Cardiology Office Note:   ? ?Date:  02/04/2022  ? ?ID:  Andrea Williamson, DOB July 13, 1951, MRN [id number] ? ?PCP:  Andrea Guadeloupe, MD ?  ?[facility] Providers ?Cardiologist:  Andrea Sable, MD    ? ?Referring MD: Andrea Guadeloupe, MD  ? ?Chief Complaint  ?Patient presents with  ? Other  ?  Past due follow up -- Meds reviewed verbally with patient.   ? ? ?History of Present Illness:   ? ?Andrea Williamson is a 71 y.o. 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  Previously seen to establish care. ? ?Cardiac monitor was placed to evaluate any significant arrhythmias contributing to CVA.  Echocardiogram was also performed 01/10/2022, EF 65 to 70%.  Currently takes Brilinta and Eliquis due to DVT and stent placement after CVA. ? ?Denies any bleeding issues with Eliquis or Brilinta.  Takes all medications as prescribed.  Blood pressures are usually well controlled at home.  Feels well, has no concerns at this time. ? ?Prior notes ?Echo 12/2021 EF 65 to 70% ?Echocardiogram 09/2021 EF 65-70, impaired relaxation.  Moderate LA dilatation. ? 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 ? ?She had a stroke in November 2022, started on aspirin and Brilinta.  Subsequently diagnosed with bilateral BK DVT, Eliquis was started.  Being followed by hematology and neurology. ? ?Stop smoking after stroke. ? ? ? ?Past Medical History:  ?Diagnosis Date  ? Deep vein thrombosis (DVT) (HCC)   ? Hypertension   ? Stroke [facility])   ? ? ?Past Surgical History:  ?Procedure Laterality Date  ? ANKLE SURGERY Left 2008  ? IR CT HEAD LTD  10/05/2021  ? IR INTRA CRAN STENT  10/01/2021  ? IR PERCUTANEOUS ART THROMBECTOMY/INFUSION INTRACRANIAL INC DIAG ANGIO  10/01/2021  ? IR US GUIDE VASC ACCESS RIGHT  10/01/2021  ? IVC FILTER INSERTION N/A 01/06/2022  ? Procedure: IVC FILTER INSERTION;  Surgeon: Algernon Huxley, MD;   Location: [facility];  Service: Cardiovascular;  Laterality: N/A;  ? RADIOLOGY WITH ANESTHESIA N/A 10/01/2021  ? Procedure: IR WITH ANESTHESIA;  Surgeon: Radiologist, Medication, MD;  Location: [place];  Service: Radiology;  Laterality: N/A;  ? ? ?Current Medications: ?Current Meds  ?Medication Sig  ? ALPRAZolam (XANAX) 0.25 MG tablet Take 1 tablet (0.25 mg total) by mouth at bedtime.  ? amLODipine (NORVASC) 5 MG tablet Take 1 tablet (5 mg total) by mouth daily.  ? apixaban (ELIQUIS) 5 MG TABS tablet Take 1 tablet (5 mg total) by mouth 2 (two) times daily.  ? atorvastatin (LIPITOR) 80 MG tablet Take 1 tablet (80 mg total) by mouth daily.  ? ferrous sulfate 325 (65 FE) MG tablet Take 325 mg by mouth daily with breakfast.  ? folic acid (FOLVITE) 1 MG tablet Take 1 tablet (1 mg total) by mouth daily.  ? hydrALAZINE (APRESOLINE) 25 MG tablet Take 1 tablet (25 mg total) by mouth every 8 (eight) hours as needed (SBP >170 or DBP >110).  ? magnesium gluconate (MAGONATE) 500 MG tablet Take 0.5 tablets (250 mg total) by mouth at bedtime.  ? metoprolol succinate (TOPROL XL) 25 MG 24 hr tablet Take 1 tablet (25 mg total) by mouth daily.  ? pantoprazole (PROTONIX) 40 MG tablet Take 1 tablet (40 mg total) by mouth daily.  ? ticagrelor (BRILINTA) 60 MG TABS tablet Take 1 tablet (60 mg  total) by mouth 2 (two) times daily.  ?  ? ?Allergies:   Patient has no known allergies.  ? ?Social History  ? ?Socioeconomic History  ? Marital status: Married  ?  Spouse name: Not on file  ? Number of children: 3  ? Years of education: 42  ? Highest education level: 12th grade  ?Occupational History  ? Occupation: Retired  ?Tobacco Use  ? Smoking status: Former  ?  Packs/day: 1.50  ?  Years: 50.00  ?  Pack years: 75.00  ?  Types: Cigarettes  ?  Quit date: 09/2021  ?  Years since quitting: 0.3  ? Smokeless tobacco: Never  ?Vaping Use  ? Vaping Use: Never used  ?Substance and Sexual Activity  ? Alcohol use: Not Currently  ? Drug use: Not  Currently  ? Sexual activity: Not Currently  ?Other Topics Concern  ? Not on file  ?Social History Narrative  ? October 2022 stopped  ? ?Social Determinants of Health  ? ?Financial Resource Strain: Low Risk   ? Difficulty of Paying Living Expenses: Not hard at all  ?Food Insecurity: No Food Insecurity  ? Worried About Charity fundraiser in the Last Year: Never true  ? Ran Out of Food in the Last Year: Never true  ?Transportation Needs: No Transportation Needs  ? Lack of Transportation (Medical): No  ? Lack of Transportation (Non-Medical): No  ?Physical Activity: Insufficiently Active  ? Days of Exercise per Week: 2 days  ? Minutes of Exercise per Session: 60 min  ?Stress: Stress Concern Present  ? Feeling of Stress : To some extent  ?Social Connections: Moderately Isolated  ? Frequency of Communication with Friends and Family: More than three times a week  ? Frequency of Social Gatherings with Friends and Family: More than three times a week  ? Attends Religious Services: Never  ? Active Member of Clubs or Organizations: No  ? Attends Archivist Meetings: Never  ? Marital Status: Married  ?  ? ?Family History: ?The patient's family history includes COPD in her mother; Cancer in her mother. ? ?ROS:   ?Please see the history of present illness.    ? All other systems reviewed and are negative. ? ?EKGs/Labs/Other Studies Reviewed:   ? ?The following studies were reviewed today: ? ? ?EKG:  EKG is  ordered today.  The ekg ordered today demonstrates sinus rhythm. ? ?Recent Labs: ?01/06/2022: TSH 1.613 ?01/13/2022: Magnesium 1.9 ?01/20/2022: ALT 58; BUN 19; Creatinine, Ser 1.13; Hemoglobin 9.6; Platelets 649; Potassium 4.2; Sodium 138  ?Recent Lipid Panel ?   ?Component Value Date/Time  ? CHOL 317 (H) 10/02/2021 0401  ? TRIG 182 (H) 10/02/2021 0401  ? HDL 49 10/02/2021 0401  ? CHOLHDL 6.5 10/02/2021 0401  ? VLDL 36 10/02/2021 0401  ? LDLCALC 232 (H) 10/02/2021 0401  ? ? ? ?Risk Assessment/Calculations:   ? ? ?     ? ?Physical Exam:   ? ?VS:  BP (!) 142/80 (BP Location: Left Arm, Patient Position: Sitting, Cuff Size: Normal)   Pulse 70   Ht '5\' 2"'$  (1.575 m)   Wt 156 lb (70.8 kg)   SpO2 98%   BMI 28.53 kg/m?    ? ?Wt Readings from Last 3 Encounters:  ?02/04/22 156 lb (70.8 kg)  ?02/02/22 154 lb 9.6 oz (70.1 kg)  ?01/20/22 153 lb 12.8 oz (69.8 kg)  ?  ? ?GEN:  Well nourished, well developed in no acute distress ?HEENT: Normal ?NECK: No  JVD; No carotid bruits ?CARDIAC: RRR, no murmurs, rubs, gallops ?RESPIRATORY:  Clear to auscultation without rales, wheezing or rhonchi  ?ABDOMEN: Soft, non-tender, non-distended ?MUSCULOSKELETAL:  No edema; No deformity  ?SKIN: Warm and dry ?NEUROLOGIC:  Alert and oriented x 3 ?PSYCHIATRIC:  Normal affect  ? ?ASSESSMENT:   ? ?1. Cerebrovascular accident (CVA), unspecified mechanism ([place])   ?2. Primary hypertension   ?3. Pure hypercholesterolemia   ? ? ?PLAN:   ? ?In order of problems listed above: ? ?CVA, cardiac monitor with no evidence for A-fib or flutter.  Occasional paroxysmal SVT noted.  Patient asymptomatic with no palpitations.  Echo EF 65 to 70%.  Continue Eliquis and Lipitor.  Also on Eliquis due to bilateral BK DVT. ?Hypertension, BP elevated today, usually controlled.  Continue amlodipine.  Monitor BPs. ?Hyperlipidemia, continue Lipitor 80 mg daily.  Check fasting lipid profile today. ? ?Follow-up in 4 months. ? ?   ? ? ?Medication Adjustments/Labs and Tests Ordered: ?Current medicines are reviewed at length with the patient today.  Concerns regarding medicines are outlined above.  ?Orders Placed This Encounter  ?Procedures  ? Lipid panel  ? EKG 12-Lead  ? ?No orders of the defined types were placed in this encounter. ? ? ?Patient Instructions  ?Medication Instructions:  ? ?Your physician recommends that you continue on your current medications as directed. Please refer to the Current Medication list given to you today. ? ?*If you need a refill on your cardiac medications before  your next appointment, please call your pharmacy* ? ? ?Lab Work: ? ?Fasting Lipid to be drawn today at the [facility]. ? ?If you have labs (blood work) drawn today and your tests are completely normal, you will receive

## 2022-02-04 NOTE — Patient Instructions (Signed)
Medication Instructions:  ? ?Your physician recommends that you continue on your current medications as directed. Please refer to the Current Medication list given to you today. ? ?*If you need a refill on your cardiac medications before your next appointment, please call your pharmacy* ? ? ?Lab Work: ? ?Fasting Lipid to be drawn today at the [facility]. ? ?If you have labs (blood work) drawn today and your tests are completely normal, you will receive your results only by: ?MyChart Message (if you have MyChart) OR ?A paper copy in the mail ?If you have any lab test that is abnormal or we need to change your treatment, we will call you to review the results. ? ? ?Testing/Procedures: ? ?None ordered ? ? ?Follow-Up: ?At [facility], you and your health needs are our priority.  As part of our continuing mission to provide you with exceptional heart care, we have created designated Provider Care Teams.  These Care Teams include your primary Cardiologist (physician) and Advanced Practice Providers (APPs -  Physician Assistants and Nurse Practitioners) who all work together to provide you with the care you need, when you need it. ? ?We recommend signing up for the patient portal called "MyChart".  Sign up information is provided on this After Visit Summary.  MyChart is used to connect with patients for Virtual Visits (Telemedicine).  Patients are able to view lab/test results, encounter notes, upcoming appointments, etc.  Non-urgent messages can be sent to your provider as well.   ?To learn more about what you can do with MyChart, go to [email or web address].   ? ?Your next appointment:   ?4-5 months  ? ?The format for your next appointment:   ?In Person ? ?Provider:   ?You may see Kate Sable, MD or one of the following Advanced Practice Providers on your designated Care Team:   ?Murray Hodgkins, NP ?Christell Faith, PA-C ?Cadence Kathlen Mody, PA-C  ? ? ?Other Instructions ? ? ?

## 2022-02-08 ENCOUNTER — Telehealth: Payer: Self-pay | Admitting: Cardiology

## 2022-02-08 NOTE — Telephone Encounter (Signed)
Needing a diagnostic code that is more compatible with ZIO for insurance purposes, please assist. ?

## 2022-02-09 ENCOUNTER — Other Ambulatory Visit: Payer: Self-pay

## 2022-02-09 ENCOUNTER — Ambulatory Visit: Payer: Medicare Other

## 2022-02-09 DIAGNOSIS — E7800 Pure hypercholesterolemia, unspecified: Secondary | ICD-10-CM

## 2022-02-09 NOTE — Telephone Encounter (Deleted)
-----   Message from Kate Sable, MD sent at 02/05/2022  6:28 PM EDT ----- ?Cholesterol improving, continue medication as prescribed.  Repeat cholesterol in 3 to 4 months prior to follow-up visit. ?

## 2022-02-09 NOTE — Progress Notes (Signed)
See Lipid result note from 02/05/22. ?

## 2022-02-11 ENCOUNTER — Ambulatory Visit: Payer: Medicare Other

## 2022-02-15 ENCOUNTER — Inpatient Hospital Stay: Payer: Medicare Other | Admitting: Neurology

## 2022-02-16 ENCOUNTER — Ambulatory Visit: Payer: Medicare Other | Attending: Physical Medicine and Rehabilitation

## 2022-02-16 ENCOUNTER — Encounter: Payer: Self-pay | Admitting: Gynecologic Oncology

## 2022-02-16 DIAGNOSIS — Z8673 Personal history of transient ischemic attack (TIA), and cerebral infarction without residual deficits: Secondary | ICD-10-CM | POA: Diagnosis not present

## 2022-02-16 DIAGNOSIS — R509 Fever, unspecified: Secondary | ICD-10-CM | POA: Diagnosis not present

## 2022-02-16 DIAGNOSIS — M6281 Muscle weakness (generalized): Secondary | ICD-10-CM | POA: Insufficient documentation

## 2022-02-16 DIAGNOSIS — I10 Essential (primary) hypertension: Secondary | ICD-10-CM | POA: Diagnosis not present

## 2022-02-16 DIAGNOSIS — D5911 Warm autoimmune hemolytic anemia: Secondary | ICD-10-CM | POA: Diagnosis not present

## 2022-02-17 ENCOUNTER — Telehealth: Payer: Self-pay

## 2022-02-17 ENCOUNTER — Ambulatory Visit
Admission: RE | Admit: 2022-02-17 | Discharge: 2022-02-17 | Disposition: A | Discharge status: Home / Self Care | Payer: Medicare Other | Source: Ambulatory Visit | Attending: Gynecologic Oncology | Admitting: Gynecologic Oncology

## 2022-02-17 DIAGNOSIS — N9489 Other specified conditions associated with female genital organs and menstrual cycle: Secondary | ICD-10-CM | POA: Insufficient documentation

## 2022-02-17 IMAGING — MR MR PELVIS WO/W CM
13 of 16 series · 36 of 48 positions shown · IV contrast (7 ml Gadavist)
Comparison: [DATE]

CLINICAL DATA: Follow-up right adnexal mass.

EXAM:
MRI PELVIS WITHOUT AND WITH CONTRAST
TECHNIQUE: Multiplanar multisequence MR imaging of the pelvis was performed
both before and after administration of intravenous contrast.
CONTRAST:  7mL GADAVIST GADOBUTROL 1 MMOL/ML IV SOLN

[Series 2: T2 · coronal · 5.0mm · 1.41mm/px · 1 of 30 slices shown (1 of 3)]
[im 1/30]
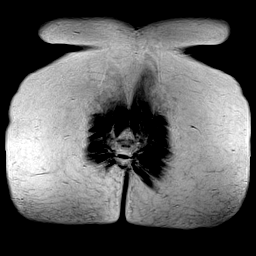

[Series 3: T2 · coronal · 5.0mm · 0.69mm/px · 1 of 31 slices shown (2 of 3)]
[im 1/31]
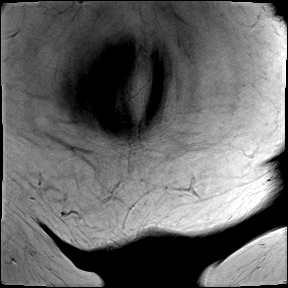

[Series 4: T2 · axial · 5.0mm · 0.75mm/px · z∈[-175,+41]mm · 2 of 37 slices shown (3 of 3)]
[im 1/37]
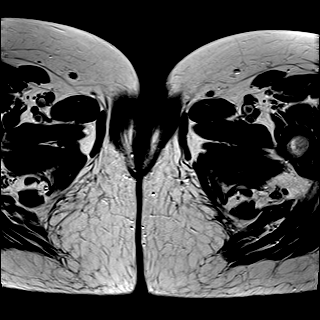
[im 37/37]
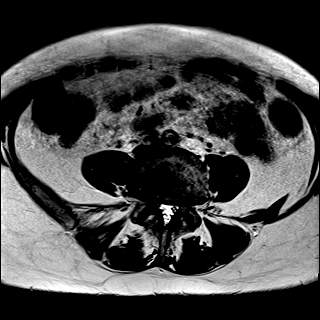

[Series 5: T2 fat-sat · axial · 5.0mm · 0.90mm/px · z∈[-175,+41]mm · 2 of 37 slices shown]
[im 1/37]
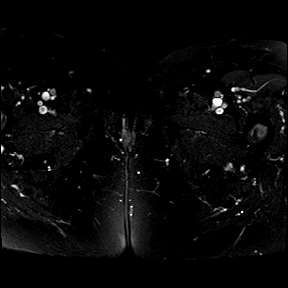
[im 37/37]
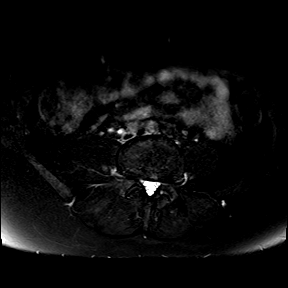

[Series 6: sag tse · sagittal · 5.0mm · 0.75mm/px · 2 of 33 slices shown]
[im 1/33]
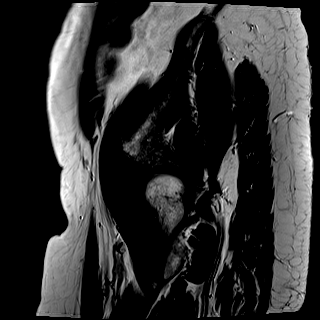
[im 33/33]
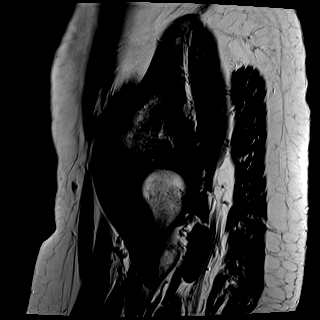

[Series 7: ax dwi_tracew · axial · 6.0mm · 1.14mm/px · z∈[-186,+52]mm · 5 of 102 slices shown]
[im 1/102]
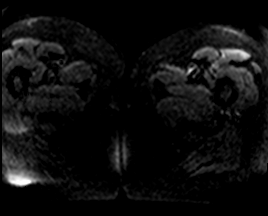
[im 26/102]
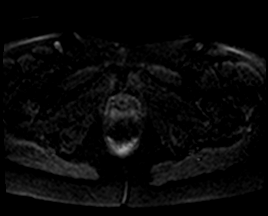
[im 51/102]
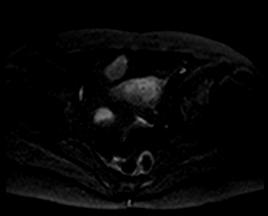
[im 76/102]
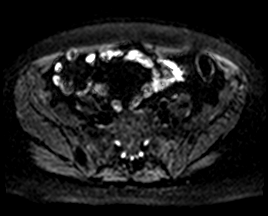
[im 102/102]
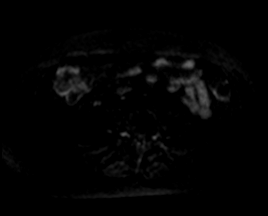

[Series 8: ax dwi_adc · axial · 6.0mm · 1.14mm/px · z∈[-186,+52]mm · 2 of 34 slices shown]
[im 1/34]
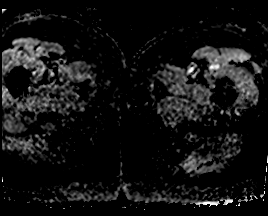
[im 34/34]
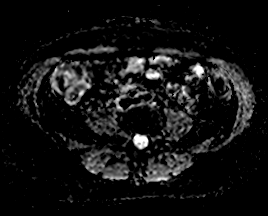

[Series 9: DIXON · axial · 3.0mm · 1.19mm/px · z∈[-173,+40]mm · 4 of 72 slices shown (1 of 2)]
[im 1/72]
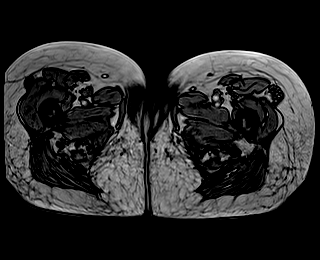
[im 24/72]
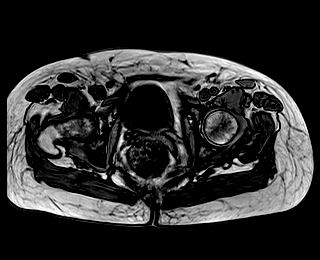
[im 48/72]
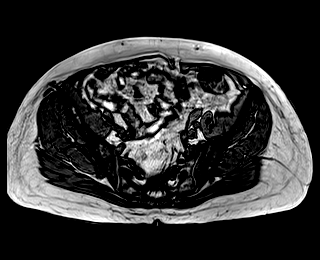
[im 72/72]
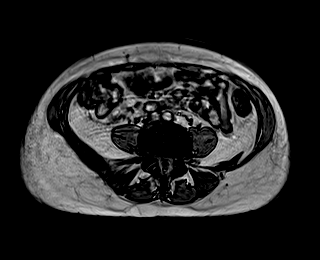

[Series 10: DIXON · axial · 3.0mm · 1.19mm/px · z∈[-173,+40]mm · 4 of 72 slices shown (2 of 2)]
[im 1/72]
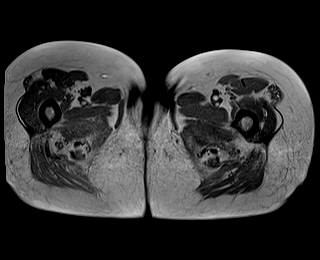
[im 24/72]
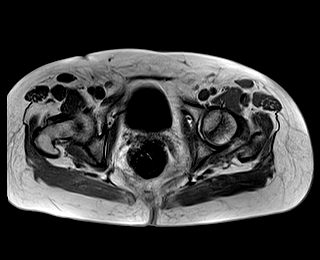
[im 48/72]
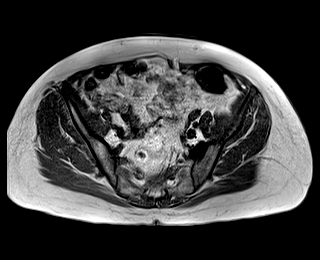
[im 72/72]
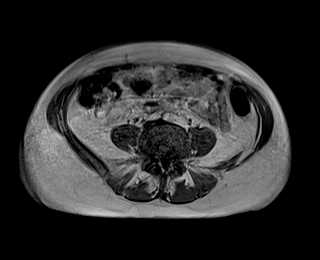

[Series 12: T1 dynamic fat-sat · axial · 3.0mm · 0.47mm/px · z∈[-185,+52]mm · 4 of 80 slices shown]
[im 1/80]
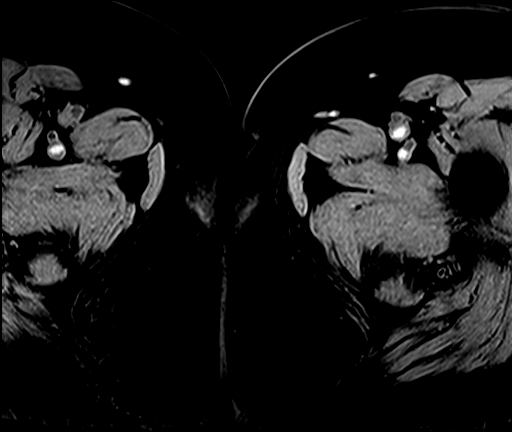
[im 27/80]
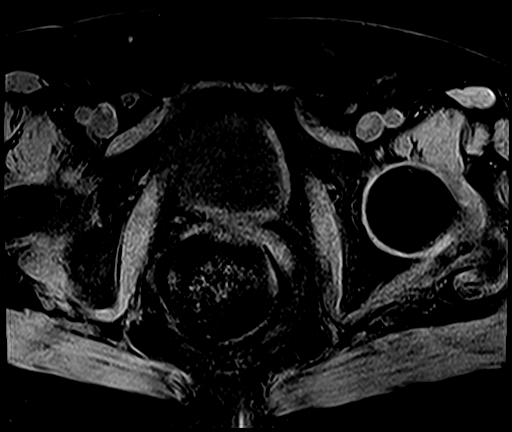
[im 53/80]
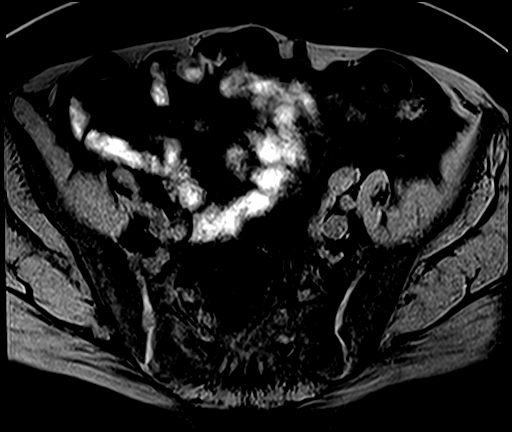
[im 80/80]
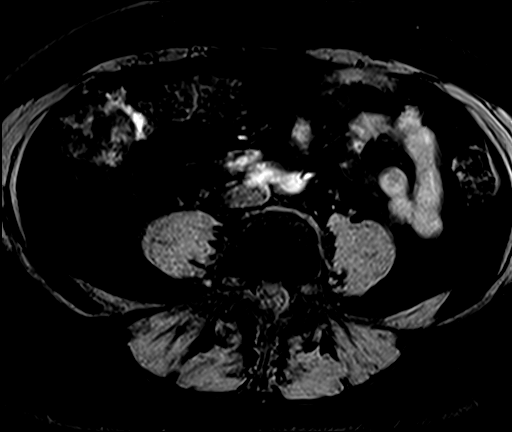

[Series 14: T1 dynamic post-contrast · axial · 3.0mm · 0.47mm/px · z∈[-185,+52]mm · 4 of 80 slices shown (1 of 3)]
[im 1/80]
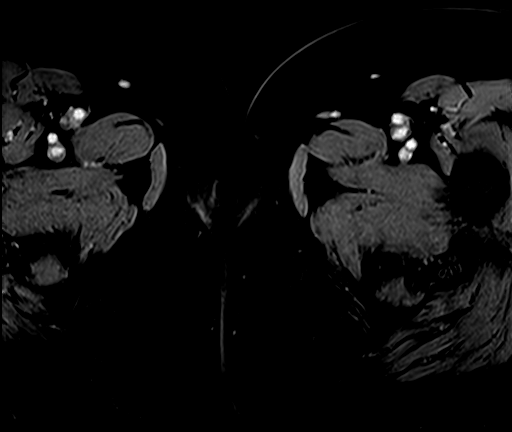
[im 27/80]
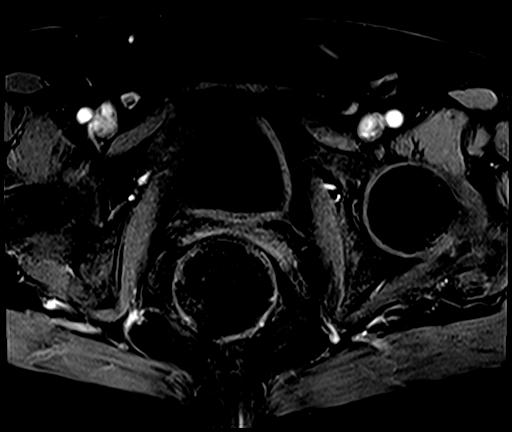
[im 53/80]
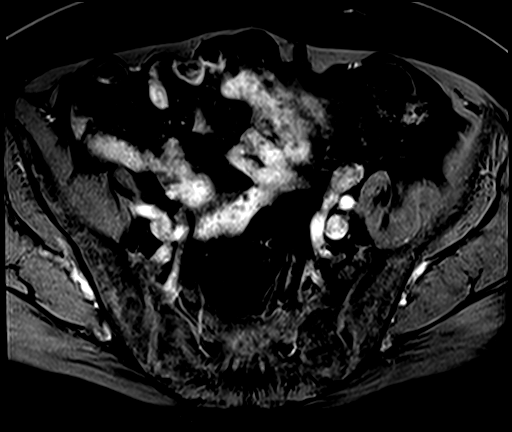
[im 80/80]
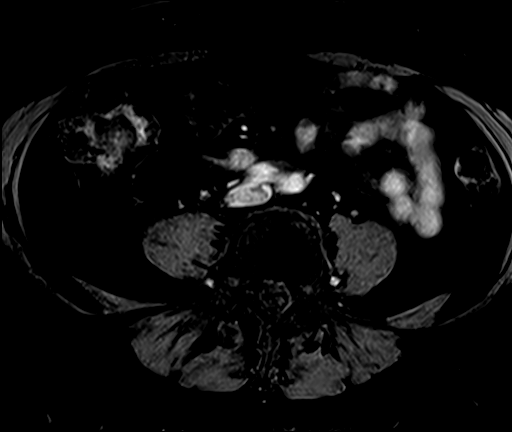

[Series 16: T1 dynamic post-contrast · axial · 3.0mm · 0.47mm/px · z∈[-185,+52]mm · 4 of 80 slices shown (2 of 3)]
[im 1/80]
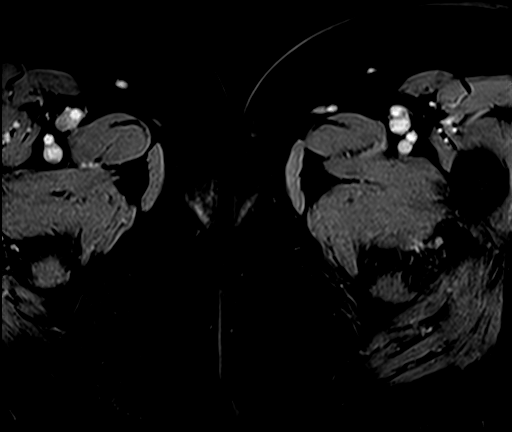
[im 27/80]
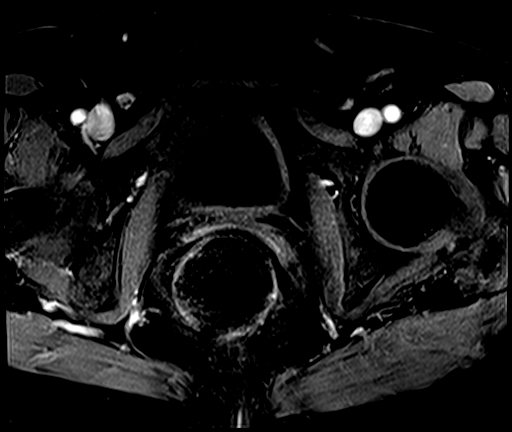
[im 53/80]
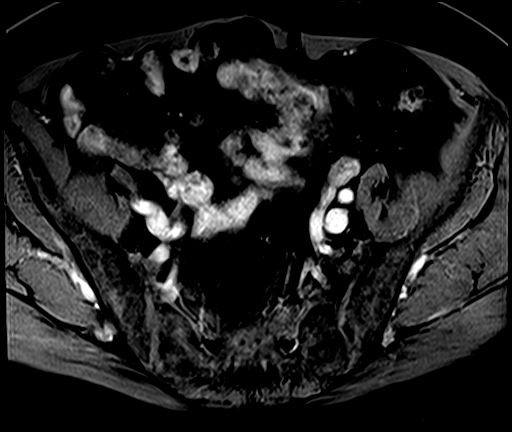
[im 80/80]
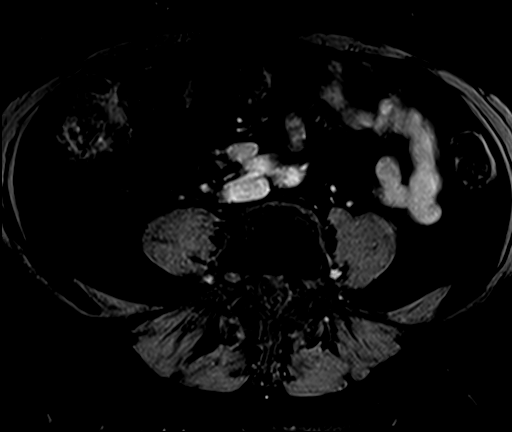

[Series 18: T1 dynamic post-contrast · axial · 3.0mm · 0.47mm/px · 1 of 80 slices shown (3 of 3)]
[im 1/80]
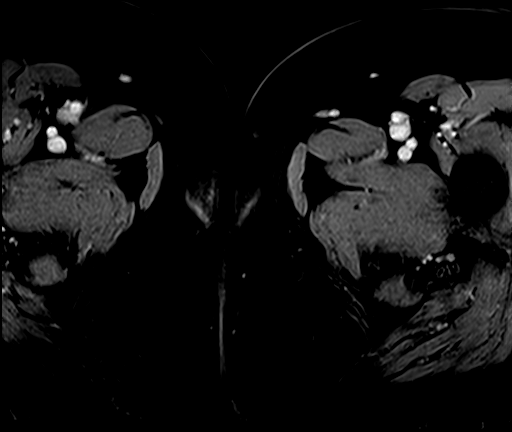

[36 of 48 positions shown; findings below may reference images not displayed]

FINDINGS: Lower Urinary Tract: No urinary bladder or urethral abnormality
identified.

Bowel: Unremarkable pelvic bowel loops.

Vascular/Lymphatic: Unremarkable. No pathologically enlarged pelvic
lymph nodes identified.

Reproductive:

-- Uterus: Measures 7.2 x 3.4 by 5.0 cm (volume = 64 cm^3). A
submucosal fibroid is seen in the left uterine fundus which measures
2.2 cm. No other fibroids identified. No evidence of abnormal
endometrial thickening. Cervix and vagina are unremarkable in
appearance. Cervix and vagina are unremarkable.

-- Right ovary: A complex cystic lesion is again seen involving the
right ovary. This cystic lesion contains an irregular mural soft
tissue nodule along its anterior wall measuring 11 mm. The overall
size of this lesion is 3.2 x 3.2 cm. This shows no significant
change in size or appearance since previous study, but is consistent
with a cystic ovarian neoplasm. Malignancy cannot be excluded.

-- Left ovary: Normal postmenopausal appearance. No ovarian or
adnexal mass identified.

Other: No peritoneal thickening or abnormal free fluid.

Musculoskeletal:  Unremarkable.
IMPRESSION: Stable 3.2 cm complex cystic mass involving the right ovary with 1
cm solid mural nodule, highly suspicious for cystic ovarian
neoplasm. Malignancy cannot be excluded. Surgical evaluation should
be considered.

Stable small uterine fibroid.

## 2022-02-17 MED ORDER — GADOBUTROL 1 MMOL/ML IV SOLN
7.0000 mL | Freq: Once | INTRAVENOUS | Status: AC | PRN
  Administered 2022-02-17: 7 mL via INTRAVENOUS

## 2022-02-17 NOTE — Telephone Encounter (Signed)
Spoke with patients daughter and notified her that they do not need to come to the cancer center for [facility] appointment tomorrow. Dr. Berline Lopes would like this to be a phone visit. She verbalized understanding and states "That actually works out better for [place]. Could you please have her call [phone]. I will have my mom with me and be with her for the call." ?Advised her that Dr. Berline Lopes will call the number listed above. ?

## 2022-02-18 ENCOUNTER — Encounter: Payer: Self-pay | Admitting: Gynecologic Oncology

## 2022-02-18 ENCOUNTER — Ambulatory Visit: Payer: Medicare Other

## 2022-02-18 ENCOUNTER — Inpatient Hospital Stay: Payer: Medicare Other | Attending: Oncology | Admitting: Gynecologic Oncology

## 2022-02-18 DIAGNOSIS — Z86718 Personal history of other venous thrombosis and embolism: Secondary | ICD-10-CM | POA: Diagnosis not present

## 2022-02-18 DIAGNOSIS — Z8673 Personal history of transient ischemic attack (TIA), and cerebral infarction without residual deficits: Secondary | ICD-10-CM | POA: Diagnosis not present

## 2022-02-18 DIAGNOSIS — M6281 Muscle weakness (generalized): Secondary | ICD-10-CM

## 2022-02-18 DIAGNOSIS — I2699 Other pulmonary embolism without acute cor pulmonale: Secondary | ICD-10-CM

## 2022-02-18 DIAGNOSIS — N9489 Other specified conditions associated with female genital organs and menstrual cycle: Secondary | ICD-10-CM

## 2022-02-18 DIAGNOSIS — Z86711 Personal history of pulmonary embolism: Secondary | ICD-10-CM

## 2022-02-18 DIAGNOSIS — D3911 Neoplasm of uncertain behavior of right ovary: Secondary | ICD-10-CM

## 2022-02-18 DIAGNOSIS — I82403 Acute embolism and thrombosis of unspecified deep veins of lower extremity, bilateral: Secondary | ICD-10-CM

## 2022-02-18 NOTE — Therapy (Signed)
[facility] ?[place], [place], [address] ?Phone: [phone]   Fax:  [phone] ? ?Physical Therapy Treatment/DISCHARGE ? ?Patient Details  ?Name: Andrea Williamson ?MRN: [id number] ?Date of Birth: October 04, 1951 ?Referring Provider (PT): Reesa Chew, PA-C ? ? ?Encounter Date: 02/18/2022 ? ? PT End of Session - 02/19/22 0907   ? ? Visit Number 18   ? Number of Visits 25   ? Date for PT Re-Evaluation 03/30/22   ? PT Start Time 1149   ? PT Stop Time 1232   ? PT Time Calculation (min) 43 min   ? Equipment Utilized During Treatment Gait belt   ? Activity Tolerance Patient tolerated treatment well   ? Behavior During Therapy [facility] for tasks assessed/performed   ? ?  ?  ? ?  ? ? ?Past Medical History:  ?Diagnosis Date  ? Deep vein thrombosis (DVT) (HCC)   ? Hypertension   ? Stroke [facility])   ? ? ?Past Surgical History:  ?Procedure Laterality Date  ? ANKLE SURGERY Left 2008  ? IR CT HEAD LTD  10/05/2021  ? IR INTRA CRAN STENT  10/01/2021  ? IR PERCUTANEOUS ART THROMBECTOMY/INFUSION INTRACRANIAL INC DIAG ANGIO  10/01/2021  ? IR US GUIDE VASC ACCESS RIGHT  10/01/2021  ? IVC FILTER INSERTION N/A 01/06/2022  ? Procedure: IVC FILTER INSERTION;  Surgeon: Algernon Huxley, MD;  Location: [facility];  Service: Cardiovascular;  Laterality: N/A;  ? RADIOLOGY WITH ANESTHESIA N/A 10/01/2021  ? Procedure: IR WITH ANESTHESIA;  Surgeon: Radiologist, Medication, MD;  Location: [facility];  Service: Radiology;  Laterality: N/A;  ? ? ?There were no vitals filed for this visit. ? ? Subjective Assessment - 02/18/22 1153   ? ? Subjective Pt returns for follow-up.  Patient reports increased activity levels.  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 Pt son present for session.  He reports patient doing well ambulating in grocery stores. Pt states, "everything seems to better than it was before I went to the hospital." Pt son reports pt  needs more endurance.  Patient reports no falls or balance difficulty.   ? Patient is accompained by: Family member   ? Pertinent History Pt is a 71 y/o female presenting with R LE strength and balance deficits. Pt is s/p L hemisphere CVA, admitted to the ED 10/01/21. Imaging included MRI scans on 10/02/21 yielding "Redemonstrated infarcts in the left ACA and ACA/PCA watershed  territory, with one new area of restricted diffusion in the medial right parietal lobe, likely an additional acute infarct, status post  A2 stenting. A small amount of associated petechial hemorrhage is  noted within the medial left frontal lobe, without significant  hemorrhagic transformation." per Dr. Merilyn Baba, MD. Pt has PMH that includes HLD.   ? Limitations Walking;Standing   ? How long can you sit comfortably? "a long time"   ? How long can you stand comfortably? 30 minutes to an hour   ? How long can you walk comfortably? 7 minutes   ? Diagnostic tests CT (10/01/21) - 1. The right common femoral artery has normal caliber, adequate for   vascular access.   2. 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   3. Intracranial atherosclerotic disease with multifocal areas of   mild stenosis in the left ACA and MCA vascular trees. MRI HEAD WITHOUT CONTRAST (10/12/21) - "Redemonstrated infarcts in  the left ACA and ACA/PCA watershed  territory, with one new area of restricted diffusion in the medial right parietal lobe, likely an additional acute infarct, status post  A2 stenting. A small amount of associated petechial hemorrhage is  noted within the medial left frontal lobe, without significant  hemorrhagic transformation."   ? Patient Stated Goals wants to be able to go back to bowling in january, taking care of her dog and cat   ? Currently in Pain? No/denies   ? ?  ?  ? ?  ? ? ? ?INTERVENTIONS - reassessment of goals for discharge/treatment. See goal section for details.  Pt has met 6/8 original PT goals and partially met remaining two  goals.  ? ?PT instructs patient through HEP for discharge and how to advance HEP beyond therapy.  Patient verbalized understanding and is able to return correct demonstration of interventions including progressions. ? ?Instructed pt in RTB rows and RTB pull-aparts for shoulder ER for promoting upright posture/strengthening postural muscles 2 sets of 10 for each. Pt reported no pain with interventions.  ? ? ?Access Code: [email or web address] ?URL: [email or web address] ?Date: 02/18/2022 ?Prepared by: Ricard Dillon ? ?Exercises ?- Sit to Stand  - 1 x daily - 5 x weekly - 3 sets - 10 reps ?- Standing Hip Abduction with Counter Support  - 1 x daily - 5 x weekly - 3 sets - 10 reps ?- Sitting Knee Extension with Resistance  - 1 x daily - 5 x weekly - 3 sets - 10 reps ?- Tandem Stance with Support  - 1 x daily - 7 x weekly - 2 sets - 2 reps - 30 hold ? ? ? ?Pt educated throughout session about proper posture and technique with exercises. Improved exercise technique, movement at target joints, use of target muscles after min to mod verbal, visual, tactile cues. ? ?Note: Portions of this document were prepared using Dragon voice recognition software and although reviewed may contain unintentional dictation errors in syntax, grammar, or spelling. ? ? ? ? PT Education - 02/19/22 0906   ? ? Education Details Discharge recommendations, HEP, how to continue to advance HEP outside of PT. instructed patient to contact clinic with any questions or to seek new referral should she feel she regresses.   ? Person(s) Educated Patient;Child(ren)   ? Methods Explanation;Demonstration;Tactile cues;Verbal cues;Handout   ? Comprehension Verbalized understanding;Returned demonstration   ? ?  ?  ? ?  ? ? ? PT Short Term Goals - 02/19/22 0908   ? ?  ? PT SHORT TERM GOAL #1  ? Title Pt will be independent with HEP in order to improve strength and balance in order to decrease fall risk and improve function at home and work.   ? Baseline 10/21/21:  HEP deferred to next visit; 1/17: Pt performing progressive walking program, pt to increase time ambulated by 5 minutes; 3/21: pt indep. with HEP 4/7: Updated HEP for discharge.  Patient able to verbalize instructions and demonstrate correct technique within session.   ? Time 6   ? Period Weeks   ? Status Achieved   ? Target Date 01/12/22   ? ?  ?  ? ?  ? ? ? ? PT Long Term Goals - 02/18/22 1210   ? ?  ? PT LONG TERM GOAL #1  ? Title Pt will improve FOTO score to >71% to show improved functional mobility with ADLs   ? Baseline 10/21/21: 58%; 12/01/2021: 68%; 3/21: 69   ? Time  8   ? Period Weeks   ? Status Partially Met   ? Target Date 03/30/22   ?  ? PT LONG TERM GOAL #2  ? Title Pt will improve BERG by at least 3 points in order to demonstrate clinically significant improvement in balance.   ? Baseline 10/21/21: 44/56; 1/17: 54/56; 3/21: 55/56   ? Time 12   ? Period Weeks   ? Status Achieved   ? Target Date 01/13/22   ?  ? PT LONG TERM GOAL #3  ? Title Pt will decrease 5TSTS by at least 3 seconds in order to demonstrate clinically significant improvement in LE strength.   ? Baseline 10/21/21: 16.3 sec without UE support; 1/17: 13 sec hands-free; 3/21: 19 sec hands free (previously achieved)   ? Time 12   ? Period Weeks   ? Status Achieved   ? Target Date 01/13/22   ?  ? PT LONG TERM GOAL #4  ? Title Pt will improve 10MWT gait speed to >1.0 m/s with least restrictive AD to show clinically significant improvement and decrease risk of falls.   ? Baseline 10/21/21: 0.82 m/s with 2WW and CGA; 1/17 1 m/s without AD; 3/21: 0.95 m/s; 4/6: 1.07 m/s   ? Time 8   ? Period Weeks   ? Status Achieved   ? Target Date 03/30/22   ?  ? PT LONG TERM GOAL #5  ? Title Pt will increase 6MWT by at least 2m(1636f in order to demonstrate clinically significant improvement in cardiopulmonary endurance and community ambulation   ? Baseline 10/21/21: deferred to next visit; 10/27/2021= 790 feet with front wheeled walker; 1/17: 1065 ft no AD;  3/21: 1066 ft no AD   ? Time 12   ? Period Weeks   ? Status Achieved   ? Target Date 01/13/22   ?  ? PT LONG TERM GOAL #6  ? Title Pt will report ability to ambulate without her RW outside for at least 30 min

## 2022-02-18 NOTE — Progress Notes (Signed)
Gynecologic Oncology Telehealth Consult Note: Gyn-Onc ? ?I connected with Andrea Williamson on 02/18/22 at  4:00 PM EDT by telephone and verified that I am speaking with the correct person using two identifiers. ? ?I discussed the limitations, risks, security and privacy concerns of performing an evaluation and management service by telemedicine and the availability of in-person appointments. I also discussed with the patient that there may be a patient responsible charge related to this service. The patient expressed understanding and agreed to proceed. ? ?Other persons participating in the visit and their role in the encounter: daughter in law. ? ?Patient's location: Home ?Provider's location: [place] ? ?Reason for Visit: Follow-up recent imaging ? ?Treatment History: ?Andrea Williamson is a 71 year old female with a medical history that includes HTN, CVA s/p left A2 thrombectomy with stent placement 10/01/2021 who presented to the [facility] ER with symptoms of nausea, weakness, dizziness for the past week. Ct imaging of the head was negative for acute intracranial abnormality with small chronic appearing left ACA infarcts. 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  CT AP with contrast on 11/13/2021 revealed a solid and cystic right adnexal mass measuring 4.9 cms, non specific stranding in the RUQ of the abd with associated sub-centimeter lymph nodes. 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 ?  ?To further evaluate the ovarian mass, she underwent a pelvic/transvaginal ultrasound with the impression showing a complex lesion in the right adnexa with mostly cystic echogenicity along with a lobulated solid nodule measuring 1.4 cm in size with demonstrable internal vascularity. Possibility of malignant neoplastic process in the right ovary is not excluded. There is inhomogeneous  echogenicity in myometrium with 4 cm fibroid. Left ovary is not sonographically visualized. ?  ?Ca1 25 was normal at 21.5. ?  ?Pelvic MRI was performed on 1/4 showing a complex solid and cystic 2.9 x 3 cm right ovarian mass with T2 hyperintensity and T1 hyperintensity.  Anteriorly there is a 1 cm enhancing soft tissue component which is corresponds to the ultrasound area of vascularity. ? ?Interval History: ?The patient was rehospitalized shortly after I saw her last in late February.  She was found to have persistent anemia, fever of unknown etiology but thought secondary to her pulmonary embolism, new segmental PE treated with heparin and transition back to home Eliquis (patient had an IVC filter placed during this hospitalization), and hypoxic respiratory failure.  The patient was ultimately discharged home on 3 L of oxygen which she only used for a couple of weeks and now is breathing well on room air. ? ?Feeling ok since discharge. ?NO abdominal or pelvic pain pain. ?Normal bowel and bladder function. ? ?Denies shortness of breath or chest pain.  ? ?Past Medical/Surgical History: ?Past Medical History:  ?Diagnosis Date  ? Deep vein thrombosis (DVT) (HCC)   ? Hypertension   ? Stroke [facility])   ? ? ?Past Surgical History:  ?Procedure Laterality Date  ? ANKLE SURGERY Left 2008  ? IR CT HEAD LTD  10/05/2021  ? IR INTRA CRAN STENT  10/01/2021  ? IR PERCUTANEOUS ART THROMBECTOMY/INFUSION INTRACRANIAL INC DIAG ANGIO  10/01/2021  ? IR US GUIDE VASC ACCESS RIGHT  10/01/2021  ? IVC FILTER INSERTION N/A 01/06/2022  ? Procedure: IVC FILTER INSERTION;  Surgeon: Algernon Huxley, MD;  Location: [place] [facility];  Service: Cardiovascular;  Laterality:  N/A;  ? RADIOLOGY WITH ANESTHESIA N/A 10/01/2021  ? Procedure: IR WITH ANESTHESIA;  Surgeon: Radiologist, Medication, MD;  Location: [place];  Service: Radiology;  Laterality: N/A;  ? ? ?Family History  ?Problem Relation Age of Onset  ? COPD Mother   ? Cancer Mother   ? ? ?Social  History  ? ?Socioeconomic History  ? Marital status: Married  ?  Spouse name: Not on file  ? Number of children: 3  ? Years of education: 70  ? Highest education level: 12th grade  ?Occupational History  ? Occupation: Retired  ?Tobacco Use  ? Smoking status: Former  ?  Packs/day: 1.50  ?  Years: 50.00  ?  Pack years: 75.00  ?  Types: Cigarettes  ?  Quit date: 09/2021  ?  Years since quitting: 0.4  ? Smokeless tobacco: Never  ?Vaping Use  ? Vaping Use: Never used  ?Substance and Sexual Activity  ? Alcohol use: Not Currently  ? Drug use: Not Currently  ? Sexual activity: Not Currently  ?Other Topics Concern  ? Not on file  ?Social History Narrative  ? October 2022 stopped  ? ?Social Determinants of Health  ? ?Financial Resource Strain: Low Risk   ? Difficulty of Paying Living Expenses: Not hard at all  ?Food Insecurity: No Food Insecurity  ? Worried About Charity fundraiser in the Last Year: Never true  ? Ran Out of Food in the Last Year: Never true  ?Transportation Needs: No Transportation Needs  ? Lack of Transportation (Medical): No  ? Lack of Transportation (Non-Medical): No  ?Physical Activity: Insufficiently Active  ? Days of Exercise per Week: 2 days  ? Minutes of Exercise per Session: 60 min  ?Stress: Stress Concern Present  ? Feeling of Stress : To some extent  ?Social Connections: Moderately Isolated  ? Frequency of Communication with Friends and Family: More than three times a week  ? Frequency of Social Gatherings with Friends and Family: More than three times a week  ? Attends Religious Services: Never  ? Active Member of Clubs or Organizations: No  ? Attends Archivist Meetings: Never  ? Marital Status: Married  ? ? ?Current Medications: ? ?Current Outpatient Medications:  ?  ALPRAZolam (XANAX) 0.25 MG tablet, Take 1 tablet (0.25 mg total) by mouth at bedtime., Disp: 30 tablet, Rfl: 1 ?  amLODipine (NORVASC) 5 MG tablet, Take 1 tablet (5 mg total) by mouth daily., Disp: 90 tablet, Rfl: 1 ?   apixaban (ELIQUIS) 5 MG TABS tablet, Take 1 tablet (5 mg total) by mouth 2 (two) times daily., Disp: 60 tablet, Rfl: 1 ?  atorvastatin (LIPITOR) 80 MG tablet, Take 1 tablet (80 mg total) by mouth daily., Disp: 90 tablet, Rfl: 1 ?  ferrous sulfate 325 (65 FE) MG tablet, Take 325 mg by mouth daily with breakfast., Disp: , Rfl:  ?  folic acid (FOLVITE) 1 MG tablet, Take 1 tablet (1 mg total) by mouth daily., Disp: , Rfl:  ?  hydrALAZINE (APRESOLINE) 25 MG tablet, Take 1 tablet (25 mg total) by mouth every 8 (eight) hours as needed (SBP >170 or DBP >110)., Disp: , Rfl:  ?  magnesium gluconate (MAGONATE) 500 MG tablet, Take 0.5 tablets (250 mg total) by mouth at bedtime., Disp: 30 tablet, Rfl: 0 ?  metoprolol succinate (TOPROL XL) 25 MG 24 hr tablet, Take 1 tablet (25 mg total) by mouth daily., Disp: 30 tablet, Rfl: 11 ?  pantoprazole (PROTONIX) 40 MG  tablet, Take 1 tablet (40 mg total) by mouth daily., Disp: 90 tablet, Rfl: 1 ?  ticagrelor (BRILINTA) 60 MG TABS tablet, Take 1 tablet (60 mg total) by mouth 2 (two) times daily., Disp: 180 tablet, Rfl: 2 ? ?Review of Symptoms: ?Pertinent positives as per HPI. ? ?Physical Exam: ?There were no vitals taken for this visit. ?Deferred given limitations of phone visit. ? 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 Malignancy cannot be excluded. Surgical evaluation should ?be considered. ?  ?Stable small uterine fibroid. ? ?Assessment & Plan: ?Andrea Williamson is a 71 y.o. woman with complex small adnexal mass, normal CA-125, and recent history of both a stroke and bilateral DVTs.  Most recent history complicated by new pulmonary embolism found during hospitalization in February, now has an IVC filter in place. ? ?Patient remains asymptomatic.  Preference is to avoid surgery still at this time.  Discussed with her that per her  hematologist, surgery would carry too much risk until at least 3 months after diagnosis of her new pulmonary embolism.  I would like repeat imaging to show no significant clot burden and no evidence of new pulmonary

## 2022-02-22 ENCOUNTER — Inpatient Hospital Stay: Payer: Medicare Other | Attending: Oncology

## 2022-02-22 ENCOUNTER — Inpatient Hospital Stay (HOSPITAL_BASED_OUTPATIENT_CLINIC_OR_DEPARTMENT_OTHER): Payer: Medicare Other | Admitting: Oncology

## 2022-02-22 ENCOUNTER — Encounter: Payer: Self-pay | Admitting: Oncology

## 2022-02-22 VITALS — BP 178/83 | HR 64 | Temp 97.6°F | Resp 16 | Wt 155.6 lb

## 2022-02-22 DIAGNOSIS — Z86718 Personal history of other venous thrombosis and embolism: Secondary | ICD-10-CM | POA: Insufficient documentation

## 2022-02-22 DIAGNOSIS — R5383 Other fatigue: Secondary | ICD-10-CM | POA: Insufficient documentation

## 2022-02-22 DIAGNOSIS — D5911 Warm autoimmune hemolytic anemia: Secondary | ICD-10-CM | POA: Diagnosis not present

## 2022-02-22 DIAGNOSIS — Z809 Family history of malignant neoplasm, unspecified: Secondary | ICD-10-CM | POA: Insufficient documentation

## 2022-02-22 DIAGNOSIS — I82403 Acute embolism and thrombosis of unspecified deep veins of lower extremity, bilateral: Secondary | ICD-10-CM

## 2022-02-22 DIAGNOSIS — E785 Hyperlipidemia, unspecified: Secondary | ICD-10-CM | POA: Diagnosis not present

## 2022-02-22 DIAGNOSIS — I2699 Other pulmonary embolism without acute cor pulmonale: Secondary | ICD-10-CM | POA: Insufficient documentation

## 2022-02-22 DIAGNOSIS — Z8673 Personal history of transient ischemic attack (TIA), and cerebral infarction without residual deficits: Secondary | ICD-10-CM | POA: Insufficient documentation

## 2022-02-22 DIAGNOSIS — Z87891 Personal history of nicotine dependence: Secondary | ICD-10-CM | POA: Insufficient documentation

## 2022-02-22 DIAGNOSIS — Z7901 Long term (current) use of anticoagulants: Secondary | ICD-10-CM | POA: Insufficient documentation

## 2022-02-22 DIAGNOSIS — I2782 Chronic pulmonary embolism: Secondary | ICD-10-CM | POA: Diagnosis not present

## 2022-02-22 DIAGNOSIS — I82503 Chronic embolism and thrombosis of unspecified deep veins of lower extremity, bilateral: Secondary | ICD-10-CM | POA: Diagnosis not present

## 2022-02-22 DIAGNOSIS — I10 Essential (primary) hypertension: Secondary | ICD-10-CM | POA: Insufficient documentation

## 2022-02-22 DIAGNOSIS — Z79899 Other long term (current) drug therapy: Secondary | ICD-10-CM | POA: Diagnosis not present

## 2022-02-22 DIAGNOSIS — Z836 Family history of other diseases of the respiratory system: Secondary | ICD-10-CM | POA: Diagnosis not present

## 2022-02-22 LAB — COMPREHENSIVE METABOLIC PANEL
ALT: 31 U/L (ref 0–44)
AST: 24 U/L (ref 15–41)
Albumin: 4 g/dL (ref 3.5–5.0)
Alkaline Phosphatase: 73 U/L (ref 38–126)
Anion gap: 6 (ref 5–15)
BUN: 29 mg/dL — ABNORMAL HIGH (ref 8–23)
CO2: 24 mmol/L (ref 22–32)
Calcium: 9.2 mg/dL (ref 8.9–10.3)
Chloride: 110 mmol/L (ref 98–111)
Creatinine, Ser: 1.17 mg/dL — ABNORMAL HIGH (ref 0.44–1.00)
GFR, Estimated: 50 mL/min — ABNORMAL LOW (ref >60)
Glucose, Bld: 108 mg/dL — ABNORMAL HIGH (ref 70–99)
Potassium: 3.7 mmol/L (ref 3.5–5.1)
Sodium: 140 mmol/L (ref 135–145)
Total Bilirubin: 0.6 mg/dL (ref 0.3–1.2)
Total Protein: 6.9 g/dL (ref 6.5–8.1)

## 2022-02-22 LAB — CBC WITH DIFFERENTIAL/PLATELET
Abs Immature Granulocytes: 0.08 10*3/uL — ABNORMAL HIGH (ref 0.00–0.07)
Basophils Absolute: 0.1 10*3/uL (ref 0.0–0.1)
Basophils Relative: 1 %
Eosinophils Absolute: 0.4 10*3/uL (ref 0.0–0.5)
Eosinophils Relative: 6 %
HCT: 35.4 % — ABNORMAL LOW (ref 36.0–46.0)
Hemoglobin: 11.4 g/dL — ABNORMAL LOW (ref 12.0–15.0)
Immature Granulocytes: 1 %
Lymphocytes Relative: 44 %
Lymphs Abs: 2.9 10*3/uL (ref 0.7–4.0)
MCH: 32.5 pg (ref 26.0–34.0)
MCHC: 32.2 g/dL (ref 30.0–36.0)
MCV: 100.9 fL — ABNORMAL HIGH (ref 80.0–100.0)
Monocytes Absolute: 1 10*3/uL (ref 0.1–1.0)
Monocytes Relative: 15 %
Neutro Abs: 2.2 10*3/uL (ref 1.7–7.7)
Neutrophils Relative %: 33 %
Platelets: 315 10*3/uL (ref 150–400)
RBC: 3.51 MIL/uL — ABNORMAL LOW (ref 3.87–5.11)
RDW: 14 % (ref 11.5–15.5)
WBC: 6.6 10*3/uL (ref 4.0–10.5)
nRBC: 0 % (ref 0.0–0.2)

## 2022-02-22 LAB — RETICULOCYTES
Immature Retic Fract: 24.6 % — ABNORMAL HIGH (ref 2.3–15.9)
RBC.: 3.55 MIL/uL — ABNORMAL LOW (ref 3.87–5.11)
Retic Count, Absolute: 143.1 10*3/uL (ref 19.0–186.0)
Retic Ct Pct: 4 % — ABNORMAL HIGH (ref 0.4–3.1)

## 2022-02-22 LAB — LACTATE DEHYDROGENASE: LDH: 179 U/L (ref 98–192)

## 2022-02-22 NOTE — Progress Notes (Signed)
? ? ? ?Hematology/Oncology Consult note ?[place]  ?[phone] [phone] ? ?Patient Care Team: ?[name], MD as PCP - General (Oncology) ?Kate Sable, MD as PCP - Cardiology (Cardiology) ?[name], MD as Consulting Physician (Hematology and Oncology)  ? ?Name of the patient: Andrea Williamson  ?[id number]  ?28-Feb-1951  ? ?Date of visit: 02/22/22 ? ?Diagnosis- 1. Warm autoimmune hemolytic anemia ?2. H/o DVT and PE ? ?Chief complaint/ Reason for visit- routine f/u of anemia and h/o dvt ? ?Heme/Onc history: patient is a 71 year old female with a past medical history significant for hypertension and hyperlipidemia who was admitted to [facility] for symptoms of acute stroke when she presented with right lower extremity weakness and difficulty speaking on 10/01/2021.  CT head showed acute infarct in the left frontal lobe ACA territory.  She underwent cerebral angiogram and underwent thrombectomy with revascularization and left P2 stenting by Dr. Norma Fredrickson and subsequently discharged to rehab.  She was started on aspirin and Brilinta.  During that admission patient had a normal CBC with a white count of 9.3, H&H of 13/37.8 and a platelet count of 245.  Patient was noted to have increasing icterus as well as nausea and dizziness which prompted her visit to the ER.  On admission patient was noted to have a white cell count of 22, H&H of 4.5/13.4 and MCV of 111 with a platelet count of 352.  Total bilirubin was elevated at 6 thereby raising the concern for possible hemolytic anemia.  Patient was started on high-dose steroids and transferred to [facility].  She received 1 dose of rituximab there.  CT chest abdomen and pelvis with contrast did not show any evidence of malignancy but did show a possible Right adnexal mass which will be followed up by GYN as an outpatient. Plans for surgery on hold due to recent DVT/PE.  Noted to have bilateral lower extremity DVT as well for  which she is on Eliquis. ?  ?She presented with nosebleeds to ER. Hb dropped to 8. No overt GI bleeding. Eliquis was held. Ivc filter was inserted. New segmental PE. Eliquis therefore restarted. She is also on brilinta ?  ? ?Interval history- she is taking brilinta and eliquis and presently tolerating it well. Denies any bleeding issues ? ?ECOG PS- 1 ?Pain scale- 0 ? ? ?Review of systems- Review of Systems  ?Constitutional:  Positive for malaise/fatigue. Negative for chills, fever and weight loss.  ?HENT:  Negative for congestion, ear discharge and nosebleeds.   ?Eyes:  Negative for blurred vision.  ?Respiratory:  Negative for cough, hemoptysis, sputum production, shortness of breath and wheezing.   ?Cardiovascular:  Negative for chest pain, palpitations, orthopnea and claudication.  ?Gastrointestinal:  Negative for abdominal pain, blood in stool, constipation, diarrhea, heartburn, melena, nausea and vomiting.  ?Genitourinary:  Negative for dysuria, flank pain, frequency, hematuria and urgency.  ?Musculoskeletal:  Negative for back pain, joint pain and myalgias.  ?Skin:  Negative for rash.  ?Neurological:  Negative for dizziness, tingling, focal weakness, seizures, weakness and headaches.  ?Endo/Heme/Allergies:  Does not bruise/bleed easily.  ?Psychiatric/Behavioral:  Negative for depression and suicidal ideas. The patient does not have insomnia.    ? ? ? ?No Known Allergies ? ? ?Past Medical History:  ?Diagnosis Date  ? Deep vein thrombosis (DVT) (HCC)   ? Hypertension   ? Stroke [facility])   ? ? ? ?Past Surgical History:  ?Procedure Laterality Date  ? ANKLE SURGERY Left 2008  ?  IR CT HEAD LTD  10/05/2021  ? IR INTRA CRAN STENT  10/01/2021  ? IR PERCUTANEOUS ART THROMBECTOMY/INFUSION INTRACRANIAL INC DIAG ANGIO  10/01/2021  ? IR US GUIDE VASC ACCESS RIGHT  10/01/2021  ? IVC FILTER INSERTION N/A 01/06/2022  ? Procedure: IVC FILTER INSERTION;  Surgeon: Algernon Huxley, MD;  Location: [place] [facility];  Service:  Cardiovascular;  Laterality: N/A;  ? RADIOLOGY WITH ANESTHESIA N/A 10/01/2021  ? Procedure: IR WITH ANESTHESIA;  Surgeon: Radiologist, Medication, MD;  Location: [place];  Service: Radiology;  Laterality: N/A;  ? ? ?Social History  ? ?Socioeconomic History  ? Marital status: Married  ?  Spouse name: Not on file  ? Number of children: 3  ? Years of education: 78  ? Highest education level: 12th grade  ?Occupational History  ? Occupation: Retired  ?Tobacco Use  ? Smoking status: Former  ?  Packs/day: 1.50  ?  Years: 50.00  ?  Pack years: 75.00  ?  Types: Cigarettes  ?  Quit date: 09/2021  ?  Years since quitting: 0.4  ? Smokeless tobacco: Never  ?Vaping Use  ? Vaping Use: Never used  ?Substance and Sexual Activity  ? Alcohol use: Not Currently  ? Drug use: Not Currently  ? Sexual activity: Not Currently  ?Other Topics Concern  ? Not on file  ?Social History Narrative  ? October 2022 stopped  ? ?Social Determinants of Health  ? ?Financial Resource Strain: Low Risk   ? Difficulty of Paying Living Expenses: Not hard at all  ?Food Insecurity: No Food Insecurity  ? Worried About Charity fundraiser in the Last Year: Never true  ? Ran Out of Food in the Last Year: Never true  ?Transportation Needs: No Transportation Needs  ? Lack of Transportation (Medical): No  ? Lack of Transportation (Non-Medical): No  ?Physical Activity: Insufficiently Active  ? Days of Exercise per Week: 2 days  ? Minutes of Exercise per Session: 60 min  ?Stress: Stress Concern Present  ? Feeling of Stress : To some extent  ?Social Connections: Moderately Isolated  ? Frequency of Communication with Friends and Family: More than three times a week  ? Frequency of Social Gatherings with Friends and Family: More than three times a week  ? Attends Religious Services: Never  ? Active Member of Clubs or Organizations: No  ? Attends Archivist Meetings: Never  ? Marital Status: Married  ?Intimate Partner Violence: Not At Risk  ? Fear of Current or  Ex-Partner: No  ? Emotionally Abused: No  ? Physically Abused: No  ? Sexually Abused: No  ? ? ?Family History  ?Problem Relation Age of Onset  ? COPD Mother   ? Cancer Mother   ? ? ? ?Current Outpatient Medications:  ?  ALPRAZolam (XANAX) 0.25 MG tablet, Take 1 tablet (0.25 mg total) by mouth at bedtime., Disp: 30 tablet, Rfl: 1 ?  amLODipine (NORVASC) 5 MG tablet, Take 1 tablet (5 mg total) by mouth daily., Disp: 90 tablet, Rfl: 1 ?  apixaban (ELIQUIS) 5 MG TABS tablet, Take 1 tablet (5 mg total) by mouth 2 (two) times daily., Disp: 60 tablet, Rfl: 1 ?  atorvastatin (LIPITOR) 80 MG tablet, Take 1 tablet (80 mg total) by mouth daily., Disp: 90 tablet, Rfl: 1 ?  ferrous sulfate 325 (65 FE) MG tablet, Take 325 mg by mouth daily with breakfast., Disp: , Rfl:  ?  folic acid (FOLVITE) 1 MG tablet, Take 1 tablet (  1 mg total) by mouth daily., Disp: , Rfl:  ?  hydrALAZINE (APRESOLINE) 25 MG tablet, Take 1 tablet (25 mg total) by mouth every 8 (eight) hours as needed (SBP >170 or DBP >110)., Disp: , Rfl:  ?  magnesium gluconate (MAGONATE) 500 MG tablet, Take 0.5 tablets (250 mg total) by mouth at bedtime., Disp: 30 tablet, Rfl: 0 ?  metoprolol succinate (TOPROL XL) 25 MG 24 hr tablet, Take 1 tablet (25 mg total) by mouth daily., Disp: 30 tablet, Rfl: 11 ?  pantoprazole (PROTONIX) 40 MG tablet, Take 1 tablet (40 mg total) by mouth daily., Disp: 90 tablet, Rfl: 1 ?  ticagrelor (BRILINTA) 60 MG TABS tablet, Take 1 tablet (60 mg total) by mouth 2 (two) times daily., Disp: 180 tablet, Rfl: 2 ? ?Physical exam: There were no vitals filed for this visit. ?Physical Exam ?Constitutional:   ?   General: She is not in acute distress. ?Cardiovascular:  ?   Rate and Rhythm: Normal rate and regular rhythm.  ?   Heart sounds: Normal heart sounds.  ?Pulmonary:  ?   Effort: Pulmonary effort is normal.  ?   Breath sounds: Normal breath sounds.  ?Musculoskeletal:  ?   Right lower leg: No edema.  ?   Left lower leg: No edema.  ?Skin: ?    General: Skin is warm and dry.  ?Neurological:  ?   Mental Status: She is alert and oriented to person, place, and time.  ?  ? ? ?  Latest Ref Rng & Units 01/20/2022  ?  9:11 AM  ?CMP  ?Glucose 70 - 99 mg/dL 136    ?BUN 8

## 2022-02-22 NOTE — Progress Notes (Signed)
Patient here for follow up patient reports that she is feeling "pretty good" she has no questions for MD today. ?

## 2022-02-23 ENCOUNTER — Ambulatory Visit: Payer: Medicare Other

## 2022-02-23 LAB — HAPTOGLOBIN: Haptoglobin: 135 mg/dL (ref 37–355)

## 2022-02-25 ENCOUNTER — Encounter: Payer: Self-pay | Admitting: Oncology

## 2022-02-25 ENCOUNTER — Ambulatory Visit: Payer: Medicare Other

## 2022-03-01 NOTE — Telephone Encounter (Signed)
Received the following email from [place] with [employer]: ? ?We have an update from our team about this patient. ? ??The main reason for the denial is that the plan may not consider the stroke itself being the reason for the monitoring.  The monitoring is done to help diagnosis the symptomology of the patient.  The symptoms or primary condition that lead to the stroked should be coded along with the stoke.  Is the patient experiencing palpitations, syncope, etc?? ? ?Do you have any more documentation on this patient? Our billing team said that we can assist in seeing if there is a more appropriate code that will get this patient covered for the patch. ? ?Patient does not have any other history, no palpitations or syncope. ? ?  ?

## 2022-03-02 ENCOUNTER — Ambulatory Visit: Payer: Medicare Other

## 2022-03-02 ENCOUNTER — Telehealth: Payer: Self-pay

## 2022-03-02 NOTE — Telephone Encounter (Signed)
Attempted to call parient wit apt.for [place] and phone visit no answer left message ?

## 2022-03-04 ENCOUNTER — Ambulatory Visit: Payer: Medicare Other

## 2022-03-09 ENCOUNTER — Ambulatory Visit: Payer: Medicare Other

## 2022-03-11 ENCOUNTER — Ambulatory Visit: Payer: Medicare Other

## 2022-03-15 ENCOUNTER — Other Ambulatory Visit: Payer: Self-pay | Admitting: *Deleted

## 2022-03-15 MED ORDER — ALPRAZOLAM 0.25 MG PO TABS: 0.2500 mg | ORAL_TABLET | Freq: Every evening | ORAL | 0 refills | Status: DC

## 2022-03-16 ENCOUNTER — Ambulatory Visit: Payer: Medicare Other

## 2022-03-18 ENCOUNTER — Ambulatory Visit: Payer: Medicare Other

## 2022-03-18 DIAGNOSIS — I10 Essential (primary) hypertension: Secondary | ICD-10-CM | POA: Diagnosis not present

## 2022-03-18 DIAGNOSIS — R509 Fever, unspecified: Secondary | ICD-10-CM | POA: Diagnosis not present

## 2022-03-18 DIAGNOSIS — D5911 Warm autoimmune hemolytic anemia: Secondary | ICD-10-CM | POA: Diagnosis not present

## 2022-03-18 DIAGNOSIS — Z8673 Personal history of transient ischemic attack (TIA), and cerebral infarction without residual deficits: Secondary | ICD-10-CM | POA: Diagnosis not present

## 2022-03-19 ENCOUNTER — Other Ambulatory Visit: Payer: Self-pay | Admitting: *Deleted

## 2022-03-19 DIAGNOSIS — I2782 Chronic pulmonary embolism: Secondary | ICD-10-CM

## 2022-03-22 ENCOUNTER — Encounter: Payer: Self-pay | Admitting: Internal Medicine

## 2022-03-23 ENCOUNTER — Ambulatory Visit (INDEPENDENT_AMBULATORY_CARE_PROVIDER_SITE_OTHER): Payer: Medicare Other | Admitting: Internal Medicine

## 2022-03-23 ENCOUNTER — Encounter: Payer: Self-pay | Admitting: Internal Medicine

## 2022-03-23 VITALS — BP 145/91 | HR 61 | Ht 62.0 in | Wt 156.5 lb

## 2022-03-23 DIAGNOSIS — I63322 Cerebral infarction due to thrombosis of left anterior cerebral artery: Secondary | ICD-10-CM | POA: Diagnosis not present

## 2022-03-23 DIAGNOSIS — I10 Essential (primary) hypertension: Secondary | ICD-10-CM

## 2022-03-23 DIAGNOSIS — Z720 Tobacco use: Secondary | ICD-10-CM

## 2022-03-23 DIAGNOSIS — K5901 Slow transit constipation: Secondary | ICD-10-CM | POA: Diagnosis not present

## 2022-03-23 DIAGNOSIS — I82403 Acute embolism and thrombosis of unspecified deep veins of lower extremity, bilateral: Secondary | ICD-10-CM | POA: Diagnosis not present

## 2022-03-23 DIAGNOSIS — E7800 Pure hypercholesterolemia, unspecified: Secondary | ICD-10-CM | POA: Diagnosis not present

## 2022-03-23 MED ORDER — APIXABAN 5 MG PO TABS: 5.0000 mg | ORAL_TABLET | Freq: Two times a day (BID) | ORAL | 1 refills | Status: DC

## 2022-03-23 MED ORDER — ALPRAZOLAM 0.25 MG PO TABS: 0.2500 mg | ORAL_TABLET | Freq: Every evening | ORAL | 0 refills | Status: DC

## 2022-03-23 MED ORDER — TICAGRELOR 60 MG PO TABS: 60.0000 mg | ORAL_TABLET | Freq: Two times a day (BID) | ORAL | 2 refills | Status: DC

## 2022-03-23 NOTE — Assessment & Plan Note (Addendum)
Patient does not smoke anymore ?

## 2022-03-23 NOTE — Progress Notes (Signed)
? ?Established Patient Office Visit ? ?Subjective:  ?Patient ID: Andrea Williamson, female    DOB: 10-12-1951  Age: 71 y.o. MRN: [id number] ? ?CC:  ?Chief Complaint  ?Patient presents with  ? Follow-up  ? ? ?HPI ? ?Andrea Williamson presents for general checkup.  He denies any chest pain or shortness of breath.  Blood pressure is under control ?Past medical history past surgical history and medication has been listed below it was reviewed with the patient. ? ?Past Medical History:  ?Diagnosis Date  ? Deep vein thrombosis (DVT) (HCC)   ? Hypertension   ? Stroke [facility])   ? ? ?Past Surgical History:  ?Procedure Laterality Date  ? ANKLE SURGERY Left 2008  ? IR CT HEAD LTD  10/05/2021  ? IR INTRA CRAN STENT  10/01/2021  ? IR PERCUTANEOUS ART THROMBECTOMY/INFUSION INTRACRANIAL INC DIAG ANGIO  10/01/2021  ? IR US GUIDE VASC ACCESS RIGHT  10/01/2021  ? IVC FILTER INSERTION N/A 01/06/2022  ? Procedure: IVC FILTER INSERTION;  Surgeon: Algernon Huxley, MD;  Location: [place] [facility];  Service: Cardiovascular;  Laterality: N/A;  ? RADIOLOGY WITH ANESTHESIA N/A 10/01/2021  ? Procedure: IR WITH ANESTHESIA;  Surgeon: Radiologist, Medication, MD;  Location: [facility];  Service: Radiology;  Laterality: N/A;  ? ? ?Family History  ?Problem Relation Age of Onset  ? COPD Mother   ? Cancer Mother   ? ? ?Social History  ? ?Socioeconomic History  ? Marital status: Married  ?  Spouse name: Not on file  ? Number of children: 3  ? Years of education: 34  ? Highest education level: 12th grade  ?Occupational History  ? Occupation: Retired  ?Tobacco Use  ? Smoking status: Former  ?  Packs/day: 1.50  ?  Years: 50.00  ?  Pack years: 75.00  ?  Types: Cigarettes  ?  Quit date: 09/2021  ?  Years since quitting: 0.5  ? Smokeless tobacco: Never  ?Vaping Use  ? Vaping Use: Never used  ?Substance and Sexual Activity  ? Alcohol use: Not Currently  ? Drug use: Not Currently  ? Sexual activity: Not Currently  ?Other Topics Concern  ? Not on file  ?Social History Narrative  ?  October 2022 stopped  ? ?Social Determinants of Health  ? ?Financial Resource Strain: Low Risk   ? Difficulty of Paying Living Expenses: Not hard at all  ?Food Insecurity: No Food Insecurity  ? Worried About Charity fundraiser in the Last Year: Never true  ? Ran Out of Food in the Last Year: Never true  ?Transportation Needs: No Transportation Needs  ? Lack of Transportation (Medical): No  ? Lack of Transportation (Non-Medical): No  ?Physical Activity: Insufficiently Active  ? Days of Exercise per Week: 2 days  ? Minutes of Exercise per Session: 60 min  ?Stress: Stress Concern Present  ? Feeling of Stress : To some extent  ?Social Connections: Moderately Isolated  ? Frequency of Communication with Friends and Family: More than three times a week  ? Frequency of Social Gatherings with Friends and Family: More than three times a week  ? Attends Religious Services: Never  ? Active Member of Clubs or Organizations: No  ? Attends Archivist Meetings: Never  ? Marital Status: Married  ?Intimate Partner Violence: Not At Risk  ? Fear of Current or Ex-Partner: No  ? Emotionally Abused: No  ? Physically Abused: No  ? Sexually Abused: No  ? ? ? ?Current  Outpatient Medications:  ?  amLODipine (NORVASC) 5 MG tablet, Take 1 tablet (5 mg total) by mouth daily., Disp: 90 tablet, Rfl: 1 ?  atorvastatin (LIPITOR) 80 MG tablet, Take 1 tablet (80 mg total) by mouth daily., Disp: 90 tablet, Rfl: 1 ?  ferrous sulfate 325 (65 FE) MG tablet, Take 325 mg by mouth daily with breakfast., Disp: , Rfl:  ?  folic acid (FOLVITE) 1 MG tablet, Take 1 tablet (1 mg total) by mouth daily., Disp: , Rfl:  ?  hydrALAZINE (APRESOLINE) 25 MG tablet, Take 1 tablet (25 mg total) by mouth every 8 (eight) hours as needed (SBP >170 or DBP >110)., Disp: , Rfl:  ?  magnesium gluconate (MAGONATE) 500 MG tablet, Take 0.5 tablets (250 mg total) by mouth at bedtime., Disp: 30 tablet, Rfl: 0 ?  metoprolol succinate (TOPROL XL) 25 MG 24 hr tablet, Take 1  tablet (25 mg total) by mouth daily., Disp: 30 tablet, Rfl: 11 ?  pantoprazole (PROTONIX) 40 MG tablet, Take 1 tablet (40 mg total) by mouth daily., Disp: 90 tablet, Rfl: 1 ?  ALPRAZolam (XANAX) 0.25 MG tablet, Take 1 tablet (0.25 mg total) by mouth at bedtime., Disp: 30 tablet, Rfl: 0 ?  apixaban (ELIQUIS) 5 MG TABS tablet, Take 1 tablet (5 mg total) by mouth 2 (two) times daily., Disp: 60 tablet, Rfl: 1 ?  ticagrelor (BRILINTA) 60 MG TABS tablet, Take 1 tablet (60 mg total) by mouth 2 (two) times daily., Disp: 180 tablet, Rfl: 2  ? ?No Known Allergies ? ?ROS ?Review of Systems  ?Constitutional: Negative.   ?HENT: Negative.    ?Eyes: Negative.   ?Respiratory: Negative.    ?Cardiovascular: Negative.   ?Gastrointestinal: Negative.   ?Endocrine: Negative.   ?Genitourinary: Negative.   ?Musculoskeletal: Negative.   ?Skin: Negative.   ?Allergic/Immunologic: Negative.   ?Neurological: Negative.   ?Hematological: Negative.   ?Psychiatric/Behavioral: Negative.    ?All other systems reviewed and are negative. ? ?  ?Objective:  ?  ?Physical Exam ?Vitals reviewed.  ?Constitutional:   ?   Appearance: Normal appearance.  ?HENT:  ?   Mouth/Throat:  ?   Mouth: Mucous membranes are moist.  ?Eyes:  ?   Pupils: Pupils are equal, round, and reactive to light.  ?Neck:  ?   Vascular: No carotid bruit.  ?Cardiovascular:  ?   Rate and Rhythm: Normal rate and regular rhythm.  ?   Pulses: Normal pulses.  ?   Heart sounds: Normal heart sounds.  ?Pulmonary:  ?   Effort: Pulmonary effort is normal.  ?   Breath sounds: Normal breath sounds.  ?Abdominal:  ?   General: Bowel sounds are normal.  ?   Palpations: Abdomen is soft. There is no hepatomegaly, splenomegaly or mass.  ?   Tenderness: There is no abdominal tenderness.  ?   Hernia: No hernia is present.  ?Musculoskeletal:     ?   General: No tenderness.  ?   Cervical back: Neck supple.  ?   Right lower leg: No edema.  ?   Left lower leg: No edema.  ?Skin: ?   Findings: No rash.   ?Neurological:  ?   Mental Status: She is alert and oriented to person, place, and time.  ?   Motor: No weakness.  ?Psychiatric:     ?   Mood and Affect: Mood and affect normal.     ?   Behavior: Behavior normal.  ? ? ?BP (!) 145/91   Pulse  61   Ht '5\' 2"'$  (1.575 m)   Wt 156 lb 8 oz (71 kg)   BMI 28.62 kg/m?  ?Wt Readings from Last 3 Encounters:  ?03/23/22 156 lb 8 oz (71 kg)  ?02/22/22 155 lb 9.6 oz (70.6 kg)  ?02/04/22 156 lb (70.8 kg)  ? ? ? ?Health Maintenance Due  ?Topic Date Due  ? COVID-19 Vaccine (1) Never done  ? TETANUS/TDAP  Never done  ? COLONOSCOPY (Pts 45-24yr Insurance coverage will need to be confirmed)  Never done  ? MAMMOGRAM  Never done  ? Zoster Vaccines- Shingrix (1 of 2) Never done  ? Pneumonia Vaccine 71 Years old (1 - PCV) Never done  ? DEXA SCAN  Never done  ? ? ?There are no preventive care reminders to display for this patient. ? ?Lab Results  ?Component Value Date  ? TSH 1.613 01/06/2022  ? ?Lab Results  ?Component Value Date  ? WBC 6.6 02/22/2022  ? HGB 11.4 (L) 02/22/2022  ? HCT 35.4 (L) 02/22/2022  ? MCV 100.9 (H) 02/22/2022  ? PLT 315 02/22/2022  ? ?Lab Results  ?Component Value Date  ? NA 140 02/22/2022  ? K 3.7 02/22/2022  ? CO2 24 02/22/2022  ? GLUCOSE 108 (H) 02/22/2022  ? BUN 29 (H) 02/22/2022  ? CREATININE 1.17 (H) 02/22/2022  ? BILITOT 0.6 02/22/2022  ? ALKPHOS 73 02/22/2022  ? AST 24 02/22/2022  ? ALT 31 02/22/2022  ? PROT 6.9 02/22/2022  ? ALBUMIN 4.0 02/22/2022  ? CALCIUM 9.2 02/22/2022  ? ANIONGAP 6 02/22/2022  ? ?Lab Results  ?Component Value Date  ? CHOL 209 (H) 02/04/2022  ? ?Lab Results  ?Component Value Date  ? HDL 52 02/04/2022  ? ?Lab Results  ?Component Value Date  ? LDLCALC 118 (H) 02/04/2022  ? ?Lab Results  ?Component Value Date  ? TRIG 197 (H) 02/04/2022  ? ?Lab Results  ?Component Value Date  ? CHOLHDL 4.0 02/04/2022  ? ?Lab Results  ?Component Value Date  ? HGBA1C 4.5 (L) 11/13/2021  ? ? ?  ?Assessment & Plan:  ? ?Problem List Items Addressed This Visit    ? ?  ? Cardiovascular and Mediastinum  ? [place]  ?  Patient does not have any focal neurological sign ? ?  ?  ? Relevant Medications  ? apixaban (ELIQUIS) 5 MG TABS tablet  ? ticagrelor (BRILINTA) 665M

## 2022-03-23 NOTE — Assessment & Plan Note (Signed)
Patient does not have any focal neurological sign ?

## 2022-03-23 NOTE — Assessment & Plan Note (Addendum)

## 2022-03-23 NOTE — Assessment & Plan Note (Signed)
Patient does not have any evidence of DVT ?

## 2022-03-23 NOTE — Assessment & Plan Note (Signed)
Resolved

## 2022-03-23 NOTE — Assessment & Plan Note (Signed)
Hypercholesterolemia  I advised the patient to follow Mediterranean diet This diet is rich in fruits vegetables and whole grain, and This diet is also rich in fish and lean meat Patient should also eat a handful of almonds or walnuts daily Recent heart study indicated that average follow-up on this kind of diet reduces the cardiovascular mortality by 50 to 70%== 

## 2022-03-25 ENCOUNTER — Ambulatory Visit: Payer: Medicare Other

## 2022-03-30 ENCOUNTER — Other Ambulatory Visit: Payer: Self-pay | Admitting: *Deleted

## 2022-03-30 ENCOUNTER — Ambulatory Visit: Payer: Medicare Other

## 2022-03-30 MED ORDER — HYDRALAZINE HCL 25 MG PO TABS: 25.0000 mg | ORAL_TABLET | Freq: Three times a day (TID) | ORAL | Status: DC | PRN

## 2022-03-31 ENCOUNTER — Other Ambulatory Visit: Payer: Self-pay | Admitting: *Deleted

## 2022-03-31 MED ORDER — HYDRALAZINE HCL 25 MG PO TABS: 25.0000 mg | ORAL_TABLET | Freq: Three times a day (TID) | ORAL | 3 refills | Status: DC | PRN

## 2022-04-01 ENCOUNTER — Ambulatory Visit: Payer: Medicare Other

## 2022-04-06 ENCOUNTER — Ambulatory Visit: Payer: Medicare Other

## 2022-04-08 ENCOUNTER — Telehealth (HOSPITAL_COMMUNITY): Payer: Self-pay

## 2022-04-08 ENCOUNTER — Ambulatory Visit: Payer: Medicare Other

## 2022-04-08 NOTE — Telephone Encounter (Signed)
Called to schedule diagnostic angiogram, no answer, left vm. AW  

## 2022-04-13 ENCOUNTER — Ambulatory Visit: Payer: Medicare Other

## 2022-04-13 ENCOUNTER — Ambulatory Visit: Payer: Medicare Other | Admitting: Neurology

## 2022-04-13 ENCOUNTER — Encounter: Payer: Self-pay | Admitting: Neurology

## 2022-04-13 VITALS — BP 135/110 | HR 70 | Ht 62.0 in | Wt 162.2 lb

## 2022-04-13 DIAGNOSIS — I679 Cerebrovascular disease, unspecified: Secondary | ICD-10-CM | POA: Diagnosis not present

## 2022-04-13 DIAGNOSIS — I82469 Acute embolism and thrombosis of unspecified calf muscular vein: Secondary | ICD-10-CM

## 2022-04-13 DIAGNOSIS — I63522 Cerebral infarction due to unspecified occlusion or stenosis of left anterior cerebral artery: Secondary | ICD-10-CM

## 2022-04-13 DIAGNOSIS — E782 Mixed hyperlipidemia: Secondary | ICD-10-CM

## 2022-04-13 MED ORDER — REPATHA SURECLICK 140 MG/ML ~~LOC~~ SOAJ: 140.0000 mg | SUBCUTANEOUS | 3 refills | Status: AC

## 2022-04-13 MED ORDER — CLOPIDOGREL BISULFATE 75 MG PO TABS: 75.0000 mg | ORAL_TABLET | Freq: Every day | ORAL | 11 refills | Status: DC

## 2022-04-13 NOTE — Progress Notes (Signed)
[facility] [address] [place]. [place] [address] [phone]       OFFICE FOLLOW-UP NOTE  [name] Date of Birth:  1951-09-03 Medical Record Number:  [id number]   HPI: Andrea Williamson is a 71 year old lady who is seen today for initial office follow-up visit following hospital admission for stroke in November 2022.  She is accompanied by sister-in-law and history is obtained from them and review of electronic medical records and I personally reviewed pertinent available imaging films in PACS.  She has past medical history of hyperlipidemia and hypertension who was admitted on 10/01/2021 to [facility] for right leg weakness which developed 2 days prior to admission.  She felt she was unsteady on her gait and her leg was no longer supporting her.  CT head showed acute left anterior cerebral artery infarct with CT angiogram showing left A2/ACA occlusion CT perfusion showed 0 core and only 27 cc of penumbra she was considered a candidate for revascularization and underwent mechanical thrombectomy with revascularization but vessel Occlusion Requiring a Rescue Left ACA Stent withTICI 3 revascularization.  MRI scan redemonstrated infarcts in left ACA and ACA/PCA watershed territory and 1 tiny infarct in the right medial parietal lobe.  Follow-up MRI showed patent left A2 stent.  2D echo showed ejection fraction 65 to 70%.  LDL cholesterol was elevated at 232 mg percent and hemoglobin A1c was 5.0.  Patient was on no antithrombotics prior to admission and was placed on aspirin and Brilinta.  She was seen by physical occupational and speech therapy recommended inpatient rehab.  With plans for dual antiplatelet therapy for 6 months then aspirin alone.  Patient states subsequently she has been diagnosed with deep vein thrombosis in December 2022 in bilateral lower extremities and has been switched to Brilinta and Eliquis which is tolerating well with easy bruising but no major bleeding  episode.  Recently the cost of Brilinta has gone up and her co-pay has jumped from $46-$300 and she can no longer afford it.  She did have an 1 episode of nasal bleed in February and was seen in the emergency room.  She is also subsequently been diagnosed with hemolytic anemia and sees hematologist Dr. Janese Williamson in [place] and has appointment to see her next month.  She did have follow-up lipid profile on 02/04/2022 which still showed elevated LDL cholesterol of 11/25/2016 with total cholesterol 209.  She had a 2D echo on 01/10/2022 which showed normal ejection fraction of 65 to 70%.  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  She at present time feels tremor is not disabling and does not want medications for it.  ROS:   14 system review of systems is positive for bruising, nasal bleeding, hemolytic anemia, tremor renal failure, DVT and all other systems negative  PMH:  Past Medical History:  Diagnosis Date   Deep vein thrombosis (DVT) (HCC)    Hypertension    Stroke [facility] )     Social History:  Social History   Socioeconomic History   Marital status: Married    Spouse name: Not on file   Number of children: 3   Years of education: 12   Highest education level: 12th grade  Occupational History   Occupation: Retired  Tobacco Use   Smoking status: Former    Packs/day: 1.50    Years: 50.00  Pack years: 75.00    Types: Cigarettes    Quit date: 09/2021    Years since quitting: 0.5   Smokeless tobacco: Never  Vaping Use   Vaping Use: Never used  Substance and Sexual Activity   Alcohol use: Not Currently   Drug use: Not Currently   Sexual activity: Not Currently  Other Topics Concern   Not on file  Social History Narrative   October 2022 stopped   Social Determinants of Health   Financial Resource Strain: Low Risk    Difficulty of Paying Living Expenses: Not  hard at all  Food Insecurity: No Food Insecurity   Worried About Charity fundraiser in the Last Year: Never true   Arboriculturist in the Last Year: Never true  Transportation Needs: No Transportation Needs   Lack of Transportation (Medical): No   Lack of Transportation (Non-Medical): No  Physical Activity: Insufficiently Active   Days of Exercise per Week: 2 days   Minutes of Exercise per Session: 60 min  Stress: Stress Concern Present   Feeling of Stress : To some extent  Social Connections: Moderately Isolated   Frequency of Communication with Friends and Family: More than three times a week   Frequency of Social Gatherings with Friends and Family: More than three times a week   Attends Religious Services: Never   Marine scientist or Organizations: No   Attends Music therapist: Never   Marital Status: Married  Human resources officer Violence: Not At Risk   Fear of Current or Ex-Partner: No   Emotionally Abused: No   Physically Abused: No   Sexually Abused: No    Medications:   Current Outpatient Medications on File Prior to Visit  Medication Sig Dispense Refill   ALPRAZolam (XANAX) 0.25 MG tablet Take 1 tablet (0.25 mg total) by mouth at bedtime. 30 tablet 0   amLODipine (NORVASC) 5 MG tablet Take 1 tablet (5 mg total) by mouth daily. 90 tablet 1   apixaban (ELIQUIS) 5 MG TABS tablet Take 1 tablet (5 mg total) by mouth 2 (two) times daily. 60 tablet 1   atorvastatin (LIPITOR) 80 MG tablet Take 1 tablet (80 mg total) by mouth daily. 90 tablet 1   ferrous sulfate 325 (65 FE) MG tablet Take 325 mg by mouth daily with breakfast.     folic acid (FOLVITE) 1 MG tablet Take 1 tablet (1 mg total) by mouth daily.     hydrALAZINE (APRESOLINE) 25 MG tablet Take 1 tablet (25 mg total) by mouth every 8 (eight) hours as needed (SBP >170 or DBP >110). 90 tablet 3   magnesium gluconate (MAGONATE) 500 MG tablet Take 0.5 tablets (250 mg total) by mouth at bedtime. 30 tablet 0    metoprolol succinate (TOPROL XL) 25 MG 24 hr tablet Take 1 tablet (25 mg total) by mouth daily. 30 tablet 11   pantoprazole (PROTONIX) 40 MG tablet Take 1 tablet (40 mg total) by mouth daily. 90 tablet 1   No current facility-administered medications on file prior to visit.    Allergies:  No Known Allergies  Physical Exam General: well developed, well nourished, seated, in no evident distress Head: head normocephalic and atraumatic.  Neck: supple with no carotid or supraclavicular bruits Cardiovascular: regular rate and rhythm, no murmurs Musculoskeletal: no deformity Skin:  no rash/petichiae Vascular:  Normal pulses all extremities Vitals:   04/13/22 1341  BP: (!) 135/110  Pulse: 70   Neurologic Exam Mental Status:  Awake and fully alert. Oriented to place and time. Recent and remote memory intact. Attention span, concentration and fund of knowledge appropriate. Mood and affect appropriate.  Cranial Nerves: Fundoscopic exam reveals sharp disc margins. Pupils equal, briskly reactive to light. Extraocular movements full without nystagmus. Visual fields full to confrontation. Hearing intact. Facial sensation intact. Williamson, tongue, palate moves normally and symmetrically.  Motor: Normal bulk and tone. Normal strength in all tested extremity muscles.  Diminished fine finger movements on the right.  Orbits left over right upper extremity.  Intermittent resting and action tremor mostly in the right upper extremity and to a much lesser extent in the left upper extremity.  No cogwheel rigidity or bradykinesia. Sensory.: intact to touch ,pinprick .position and vibratory sensation.  Coordination: Rapid alternating movements normal in all extremities. Finger-to-nose and heel-to-shin performed accurately bilaterally. Gait and Station: Arises from chair without difficulty. Stance is normal. Gait demonstrates normal stride length and balance . Able to heel, toe and tandem walk with  difficulty.   Reflexes: 1+ and symmetric. Toes downgoing.   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  Vascular risk factors of hyperlipidemia, hypertension and intracranial stenosis.  She also developed DVT in December 2022 and has been on Eliquis since then     PLAN:I had a long d/w patient about his recent stroke, risk for recurrent stroke/TIAs, personally independently reviewed imaging studies and stroke evaluation results and answered questions.Continue Eliquis (apixaban) daily  for DVT and change Brilinta to Plavix 75 mg daily secondary stroke prevention as patient is no longer able to afford the co-pay for Brilinta and it has been more than 6 months since her intracranial stent . Maintain strict control of hypertension with blood pressure goal below 130/90, diabetes with hemoglobin A1c goal below 6.5% and lipids with LDL cholesterol goal below 70 mg/dL. I also advised the patient to eat a healthy diet with plenty of whole grains, cereals, fruits and vegetables, exercise regularly and maintain ideal body weight.  Patient's LDL is yet not at goal despite maximum dose of Lipitor hence recommend switching to Repatha 140 mg subcu every 2 weeks after insurance approval.  Followup in the future with me in 6 months or call earlier if necessary.  She was also advised to follow-up with her hematologist for hemolytic anemia as well as discussion about duration of Eliquis for her DVT Greater than 50% of time during this 42 minute visit was spent on counseling,explanation of diagnosis, planning of further management, discussion with patient and family and coordination of care Antony Contras, MD Note: This document was prepared with digital dictation and possible smart phrase technology. Any transcriptional errors that result from this process  are unintentional

## 2022-04-13 NOTE — Patient Instructions (Signed)
I had a long d/w patient about his recent stroke, risk for recurrent stroke/TIAs, personally independently reviewed imaging studies and stroke evaluation results and answered questions.Continue Eliquis (apixaban) daily  for DVT and change Brilinta to Plavix 75 mg daily secondary stroke prevention as patient is no longer able to afford the co-pay for Brilinta and has been more than 6 months since her intracranial stent and maintain strict control of hypertension with blood pressure goal below 130/90, diabetes with hemoglobin A1c goal below 6.5% and lipids with LDL cholesterol goal below 70 mg/dL. I also advised the patient to eat a healthy diet with plenty of whole grains, cereals, fruits and vegetables, exercise regularly and maintain ideal body weight.  Patient's LDL is yet not at goal despite maximum dose of Lipitor hence recommend switching to Repatha 140 mg subcu every 2 weeks after insurance approval.  Followup in the future with me in 6 months or call earlier if necessary.  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 You can help prevent a stroke by eating healthy, exercising, not smoking, and managing any medical conditions you have. Stroke is a leading cause of functional impairment. Primary prevention is particularly important because a majority of strokes are first-time events. Stroke changes the lives of not only those who experience a stroke but also their family and other caregivers. How can this condition affect me? A stroke is a medical emergency and should be treated right away. A stroke can lead to brain damage and can sometimes be life-threatening. If a person gets medical treatment right away, there is a better chance of surviving and recovering from a stroke. What can increase my risk? The  following medical conditions may increase your risk of a stroke: Cardiovascular disease. High blood pressure (hypertension). Diabetes. High cholesterol. Sickle cell disease. Blood clotting disorders (hypercoagulable state). Obesity. Sleep disorders (obstructive sleep apnea). Other risk factors include: Being older than age 71. Having a history of blood clots, stroke, or mini-stroke (transient ischemic attack, TIA). Genetic factors, such as race, ethnicity, or a family history of stroke. Smoking cigarettes or using other tobacco products. Taking birth control pills, especially if you also use tobacco. Heavy use of alcohol or drugs, especially cocaine and methamphetamine. Physical inactivity. What actions can I take to prevent this? Manage your health conditions High cholesterol levels. Eating a healthy diet is important for preventing high cholesterol. If cholesterol cannot be managed through diet alone, you may need to take medicines. Take any prescribed medicines to control your cholesterol as told by your health care provider. Hypertension. To reduce your risk of stroke, try to keep your blood pressure below 130/80. Eating a healthy diet and exercising regularly are important for controlling blood pressure. If these steps are not enough to manage your blood pressure, you may need to take medicines. Take any prescribed medicines to control hypertension as told by your health care provider. Ask your health care provider if you should monitor your blood pressure at home. Have your blood pressure checked every year, even if your blood pressure is normal. Blood pressure increases with age and some medical conditions. Diabetes. Eating a healthy diet and exercising regularly are important parts of managing your blood sugar (glucose). If your blood sugar cannot be managed through diet and exercise, you may need to take medicines. Take  any prescribed medicines to control your diabetes as told  by your health care provider. Get evaluated for obstructive sleep apnea. Talk to your health care provider about getting a sleep evaluation if you snore a lot or have excessive sleepiness. Make sure that any other medical conditions you have, such as atrial fibrillation or atherosclerosis, are managed. Nutrition Follow instructions from your health care provider about what to eat or drink to help manage your health condition. These instructions may include: Reducing your daily calorie intake. Limiting how much salt (sodium) you use to 1,500 milligrams (mg) each day. Using only healthy fats for cooking, such as olive oil, canola oil, or sunflower oil. Eating healthy foods. You can do this by: Choosing foods that are high in fiber, such as whole grains, and fresh fruits and vegetables. Eating at least 5 servings of fruits and vegetables a day. Try to fill one-half of your plate with fruits and vegetables at each meal. Choosing lean protein foods, such as lean cuts of meat, poultry without skin, fish, tofu, beans, and nuts. Eating low-fat dairy products. Avoiding foods that are high in sodium. This can help lower blood pressure. Avoiding foods that have saturated fat, trans fat, and cholesterol. This can help prevent high cholesterol. Avoiding processed and prepared foods. Counting your daily carbohydrate intake.  Lifestyle If you drink alcohol: Limit how much you have to: 0-1 drink a day for women who are not pregnant. 0-2 drinks a day for men. Know how much alcohol is in your drink. In the [place] one drink equals one 12 oz bottle of beer (362m), one 5 oz glass of wine (1484m, or one 1 oz glass of hard liquor (4453m Do not use any products that contain nicotine or tobacco. These products include cigarettes, chewing tobacco, and vaping devices, such as e-cigarettes. If you need help quitting, ask your health care provider. Avoid secondhand smoke. Do not use drugs. Activity  Try to stay  at a healthy weight. Get at least 30 minutes of exercise on most days, such as: Fast walking. Biking. Swimming. Medicines Take over-the-counter and prescription medicines only as told by your health care provider. Aspirin or blood thinners (antiplatelets or anticoagulants) may be recommended to reduce your risk of forming blood clots that can lead to stroke. Avoid taking birth control pills. Talk to your health care provider about the risks of taking birth control pills if: You are over 35 17ars old. You smoke. You get very bad headaches. You have had a blood clot. Where to find more information American Stroke Association: www.strokeassociation.org Get help right away if: You or a loved one has any symptoms of a stroke. "BE FAST" is an easy way to remember the main warning signs of a stroke: B - Balance. Signs are dizziness, sudden trouble walking, or loss of balance. E - Eyes. Signs are trouble seeing or a sudden change in vision. F - Face. Signs are sudden weakness or numbness of the face, or the face or eyelid drooping on one side. A - Arms. Signs are weakness or numbness in an arm. This happens suddenly and usually on one side of the body. S - Speech. Signs are sudden trouble speaking, slurred speech, or trouble understanding what people say. T - Time. Time to call emergency services. Write down what time symptoms started. You or a loved one has other signs of a stroke, such as: A sudden, severe headache with no known cause. Nausea or vomiting. Seizure. These symptoms may represent a  serious problem that is an emergency. Do not wait to see if the symptoms will go away. Get medical help right away. Call your local emergency services (911 in the [place] Do not drive yourself to the hospital. Summary You can help to prevent a stroke by eating healthy, exercising, not smoking, limiting alcohol intake, and managing any medical conditions you may have. Do not use any products that contain  nicotine or tobacco. These include cigarettes, chewing tobacco, and vaping devices, such as e-cigarettes. If you need help quitting, ask your health care provider. Remember "BE FAST" for warning signs of a stroke. Get help right away if you or a loved one has any of these signs. This information is not intended to replace advice given to you by your health care provider. Make sure you discuss any questions you have with your health care provider. Document Revised: 06/02/2020 Document Reviewed: 06/02/2020 Elsevier Patient Education  [place].

## 2022-04-14 ENCOUNTER — Telehealth: Payer: Self-pay | Admitting: *Deleted

## 2022-04-14 NOTE — Telephone Encounter (Signed)
[id number] approved through 10/14/2022.

## 2022-04-14 NOTE — Telephone Encounter (Signed)
PA for Repatha '140mg'$  started on covermymeds (key: [id number]). Pharmacy coverage through [employer] [phone]). Decision pending.

## 2022-04-15 ENCOUNTER — Ambulatory Visit: Payer: Medicare Other

## 2022-04-19 ENCOUNTER — Other Ambulatory Visit: Payer: Self-pay

## 2022-04-19 MED ORDER — ALPRAZOLAM 0.25 MG PO TABS
0.2500 mg | ORAL_TABLET | Freq: Every evening | ORAL | 0 refills | Status: DC
Start: 2022-04-19 — End: 2022-05-25

## 2022-04-20 ENCOUNTER — Ambulatory Visit: Payer: Medicare Other

## 2022-04-21 ENCOUNTER — Encounter: Payer: Self-pay | Admitting: *Deleted

## 2022-04-21 ENCOUNTER — Encounter: Payer: Self-pay | Admitting: Neurology

## 2022-04-22 ENCOUNTER — Ambulatory Visit: Payer: Medicare Other

## 2022-04-27 ENCOUNTER — Ambulatory Visit: Payer: Medicare Other

## 2022-04-28 ENCOUNTER — Other Ambulatory Visit: Payer: Self-pay | Admitting: *Deleted

## 2022-04-29 ENCOUNTER — Ambulatory Visit: Payer: Medicare Other

## 2022-04-29 ENCOUNTER — Ambulatory Visit: Admission: RE | Admit: 2022-04-29 | Payer: Medicare Other | Source: Ambulatory Visit

## 2022-04-30 ENCOUNTER — Encounter: Payer: Self-pay | Admitting: Gynecologic Oncology

## 2022-04-30 ENCOUNTER — Ambulatory Visit (HOSPITAL_COMMUNITY)
Admission: RE | Admit: 2022-04-30 | Discharge: 2022-04-30 | Disposition: A | Discharge status: Home / Self Care | Payer: Medicare Other | Source: Ambulatory Visit | Attending: Gynecologic Oncology | Admitting: Gynecologic Oncology

## 2022-04-30 DIAGNOSIS — N9489 Other specified conditions associated with female genital organs and menstrual cycle: Secondary | ICD-10-CM | POA: Insufficient documentation

## 2022-05-03 ENCOUNTER — Inpatient Hospital Stay: Payer: Medicare Other | Attending: Oncology | Admitting: Gynecologic Oncology

## 2022-05-03 DIAGNOSIS — N9489 Other specified conditions associated with female genital organs and menstrual cycle: Secondary | ICD-10-CM | POA: Diagnosis not present

## 2022-05-03 DIAGNOSIS — I2699 Other pulmonary embolism without acute cor pulmonale: Secondary | ICD-10-CM

## 2022-05-03 DIAGNOSIS — Z8673 Personal history of transient ischemic attack (TIA), and cerebral infarction without residual deficits: Secondary | ICD-10-CM

## 2022-05-03 DIAGNOSIS — I82403 Acute embolism and thrombosis of unspecified deep veins of lower extremity, bilateral: Secondary | ICD-10-CM

## 2022-05-03 NOTE — Progress Notes (Signed)
Gynecologic Oncology Telehealth Note: Gyn-Onc  I connected with Andrea Williamson on 05/03/22 at  4:55 PM EDT by telephone and verified that I am speaking with the correct person using two identifiers.  I discussed the limitations, risks, security and privacy concerns of performing an evaluation and management service by telemedicine and the availability of in-person appointments. I also discussed with the patient that there may be a patient responsible charge related to this service. The patient expressed understanding and agreed to proceed.  Other persons participating in the visit and their role in the encounter: Patient's sister as well as her husband.  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 Ct imaging of the head was negative for acute intracranial abnormality with small chronic appearing left ACA infarcts. 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  CT AP with contrast on 11/13/2021 revealed a solid and cystic right adnexal mass measuring 4.9 cms, non specific stranding in the RUQ of the abd with associated sub-centimeter lymph nodes. 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   To further evaluate the ovarian mass, she underwent a pelvic/transvaginal ultrasound with the impression showing a complex lesion in the right adnexa with mostly cystic echogenicity along with a lobulated solid nodule measuring 1.4 cm in size with demonstrable internal vascularity. Possibility of malignant neoplastic process in the right ovary is not excluded. There is  inhomogeneous echogenicity in myometrium with 4 cm fibroid. Left ovary is not sonographically visualized.   Ca1 25 was normal at 21.5.   Pelvic MRI was performed on 1/4 showing a complex solid and cystic 2.9 x 3 cm right ovarian mass with T2 hyperintensity and T1 hyperintensity.  Anteriorly there is a 1 cm enhancing soft tissue component which is corresponds to the ultrasound area of vascularity.  Pelvic MRI was performed on 02/17/2022 which showed stable 3.2 cm complex cystic mass involving the right ovary with 1 cm solid mural nodule, highly suspicious for cystic ovarian neoplasm.  Interval History: Patient reports doing well.  Denies any pelvic or abdominal pain.  Reports baseline bowel and bladder function.  Has been discharged from physical therapy as she was meeting all requirements.  Continues on her Eliquis.  Past Medical/Surgical History: Past Medical History:  Diagnosis Date   Deep vein thrombosis (DVT) ([place])    Hypertension    Stroke [facility])     Past Surgical History:  Procedure Laterality Date   ANKLE SURGERY Left 2008   IR CT HEAD LTD  10/05/2021   IR INTRA CRAN STENT  10/01/2021   IR PERCUTANEOUS ART THROMBECTOMY/INFUSION INTRACRANIAL INC DIAG ANGIO  10/01/2021   IR US GUIDE VASC ACCESS RIGHT  10/01/2021   IVC FILTER INSERTION N/A 01/06/2022   Procedure: IVC FILTER INSERTION;  Surgeon: Algernon Huxley, MD;  Location: [facility];  Service: Cardiovascular;  Laterality: N/A;   RADIOLOGY WITH ANESTHESIA N/A 10/01/2021   Procedure: IR WITH ANESTHESIA;  Surgeon: Radiologist, Medication, MD;  Location: [facility];  Service: Radiology;  Laterality: N/A;    Family History  Problem Relation Age of Onset  COPD Mother    Cancer Mother     Social History   Socioeconomic History   Marital status: Married    Spouse name: Not on file   Number of children: 3   Years of education: 12   Highest education level: 12th grade  Occupational History   Occupation: Retired  Tobacco  Use   Smoking status: Former    Packs/day: 1.50    Years: 50.00    Total pack years: 75.00    Types: Cigarettes    Quit date: 09/2021    Years since quitting: 0.6   Smokeless tobacco: Never  Vaping Use   Vaping Use: Never used  Substance and Sexual Activity   Alcohol use: Not Currently   Drug use: Not Currently   Sexual activity: Not Currently  Other Topics Concern   Not on file  Social History Narrative   October 2022 stopped   Social Determinants of Health   Financial Resource Strain: Low Risk  (11/26/2021)   Overall Financial Resource Strain (CARDIA)    Difficulty of Paying Living Expenses: Not hard at all  Food Insecurity: No Food Insecurity (11/26/2021)   Hunger Vital Sign    Worried About Running Out of Food in the Last Year: Never true    [place] in the Last Year: Never true  Transportation Needs: No Transportation Needs (11/26/2021)   PRAPARE - Hydrologist (Medical): No    Lack of Transportation (Non-Medical): No  Physical Activity: Insufficiently Active (11/26/2021)   Exercise Vital Sign    Days of Exercise per Week: 2 days    Minutes of Exercise per Session: 60 min  Stress: Stress Concern Present (11/26/2021)   [place]    Feeling of Stress : To some extent  Social Connections: Moderately Isolated (11/26/2021)   Social Connection and Isolation Panel [NHANES]    Frequency of Communication with Friends and Family: More than three times a week    Frequency of Social Gatherings with Friends and Family: More than three times a week    Attends Religious Services: Never    Marine scientist or Organizations: No    Attends Archivist Meetings: Never    Marital Status: Married    Current Medications:  Current Outpatient Medications:    ALPRAZolam (XANAX) 0.25 MG tablet, Take 1 tablet (0.25 mg total) by mouth at bedtime., Disp: 30 tablet, Rfl: 0    amLODipine (NORVASC) 5 MG tablet, Take 1 tablet (5 mg total) by mouth daily., Disp: 90 tablet, Rfl: 1   apixaban (ELIQUIS) 5 MG TABS tablet, Take 1 tablet (5 mg total) by mouth 2 (two) times daily., Disp: 60 tablet, Rfl: 1   clopidogrel (PLAVIX) 75 MG tablet, Take 1 tablet (75 mg total) by mouth daily., Disp: 30 tablet, Rfl: 11   ferrous sulfate 325 (65 FE) MG tablet, Take 325 mg by mouth daily with breakfast., Disp: , Rfl:    folic acid (FOLVITE) 1 MG tablet, Take 1 tablet (1 mg total) by mouth daily., Disp: , Rfl:    hydrALAZINE (APRESOLINE) 25 MG tablet, Take 1 tablet (25 mg total) by mouth every 8 (eight) hours as needed (SBP >170 or DBP >110)., Disp: 90 tablet, Rfl: 3   magnesium gluconate (MAGONATE) 500 MG tablet, Take 0.5 tablets (250 mg total) by mouth at bedtime., Disp: 30 tablet, Rfl: 0   metoprolol succinate (TOPROL XL) 25 MG 24 hr  tablet, Take 1 tablet (25 mg total) by mouth daily., Disp: 30 tablet, Rfl: 11   pantoprazole (PROTONIX) 40 MG tablet, Take 1 tablet (40 mg total) by mouth daily., Disp: 90 tablet, Rfl: 1  Review of Symptoms: Pertinent positives as per HPI.  Physical Exam: There were no vitals taken for this visit. Deferred given limitations of phone visit.  Laboratory & Radiologic Studies: Pelvic ultrasound on 04/30/22: IMPRESSION: 3.1 cm diameter potentially submucosal leiomyoma LEFT uterus.   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  Assessment & Plan: Andrea Williamson is a 71 y.o. woman with complex adnexal mass.  I discussed findings on recent ultrasound with the patient and her family.  There has been a small increase in the size of the adnexal mass.  Notably, there is increased size of the solid  portion.  There continued to be findings that raise the suspicion for possible malignancy.  Given these changes in the appearance of the mass, I recommend consideration of surgical exploration.  I discussed the importance of coordinating discussion among her various physicians including her neurologist and hematologist.  I will reach out to her various doctors about safety of surgery at this time.  She is now more than 3 months out from her diagnosis of a pulmonary embolism and has been on anticoagulation since.  There is a CT of the chest ordered but this has not been scheduled.  She is more than 6 months out from her CVA and thrombectomy with stent placement.  If we move forward with planning surgery, we have several options.  If the goal is to keep surgery and time under anesthesia as short as possible, then I would recommend that we proceed with robotic BSO and not plan to send anything for frozen section at the time of surgery.  We will make decisions about any additional treatment needed based on final pathology results.  If a longer surgery was deemed to be an acceptable risk, then the enlarged ovary could be sent for frozen section.  If malignancy encountered, then additional staging procedures would be considered including total hysterectomy, lymph node sampling, omentectomy, and peritoneal biopsies.  If the patient is deemed to be at too high risk still for major surgery, then I would recommend that we proceed with pelvic MRI in 2-3 months.   I discussed the assessment and treatment plan with the patient. The patient was provided with an opportunity to ask questions and all were answered. The patient agreed with the plan and demonstrated an understanding of the instructions.   The patient was advised to call back or see an in-person evaluation if the symptoms worsen or if the condition fails to improve as anticipated.   18 minutes of total time was spent for this patient encounter, including  preparation, Williamson-to-Williamson counseling with the patient and coordination of care, and documentation of the encounter.   Jeral Pinch, MD  Division of Gynecologic Oncology  Department of Obstetrics and Gynecology  [facility]

## 2022-05-04 ENCOUNTER — Ambulatory Visit: Payer: Medicare Other

## 2022-05-04 ENCOUNTER — Inpatient Hospital Stay: Payer: Medicare Other | Admitting: Gynecologic Oncology

## 2022-05-05 ENCOUNTER — Telehealth: Payer: Self-pay | Admitting: *Deleted

## 2022-05-05 NOTE — Telephone Encounter (Signed)
Per Dr Berline Lopes fax records and surgical optimization form to the patient's hemology and neurology office

## 2022-05-14 ENCOUNTER — Encounter: Payer: Self-pay | Admitting: Oncology

## 2022-05-14 ENCOUNTER — Telehealth: Payer: Self-pay

## 2022-05-14 NOTE — Telephone Encounter (Signed)
Have faxed over surgical recommendation for Dr. Lulu Riding office to [phone] and have recieved a fax confirmation. Letter is sent to HIM for scanning in the chart.

## 2022-05-17 ENCOUNTER — Telehealth: Payer: Self-pay | Admitting: Gynecologic Oncology

## 2022-05-17 NOTE — Telephone Encounter (Signed)
Called the patient to discuss surgery.  Heard back from both her hematologist as well as neurologist that the patient is at acceptable risk given time from stroke and VTE events that we could hold anticoagulation and antiplatelet therapy prior to and after surgery.  Patient is amenable to moving forward with making plans to schedule surgery.  I will ask my office to reach out on Wednesday to get her scheduled for a preoperative visit and work on selecting a date for surgery.  Plan will be for robotic BSO, possible staging including total hysterectomy and lymph node sampling, possible laparotomy.  Jeral Pinch MD Gynecologic Oncology

## 2022-05-18 ENCOUNTER — Other Ambulatory Visit: Payer: Self-pay | Admitting: Internal Medicine

## 2022-05-21 ENCOUNTER — Telehealth: Payer: Self-pay | Admitting: *Deleted

## 2022-05-21 NOTE — Telephone Encounter (Signed)
Spoke with pt and offered her the surgery date of July 26th, 2023. Pt is agreeable to date and stated it will work out for her. Informed her we will let the providers know and pre admissions will reach out to her for further instructions. She verbalized understanding. Joylene John, NP notified.

## 2022-05-22 ENCOUNTER — Other Ambulatory Visit: Payer: Self-pay | Admitting: Internal Medicine

## 2022-05-24 ENCOUNTER — Other Ambulatory Visit: Payer: Self-pay | Admitting: Internal Medicine

## 2022-05-25 ENCOUNTER — Inpatient Hospital Stay (HOSPITAL_BASED_OUTPATIENT_CLINIC_OR_DEPARTMENT_OTHER): Payer: Medicare Other | Admitting: Oncology

## 2022-05-25 ENCOUNTER — Inpatient Hospital Stay: Payer: Medicare Other | Attending: Oncology

## 2022-05-25 ENCOUNTER — Encounter: Payer: Self-pay | Admitting: Oncology

## 2022-05-25 VITALS — BP 134/78 | HR 66 | Temp 97.2°F | Resp 16 | Wt 156.5 lb

## 2022-05-25 DIAGNOSIS — R110 Nausea: Secondary | ICD-10-CM | POA: Insufficient documentation

## 2022-05-25 DIAGNOSIS — I10 Essential (primary) hypertension: Secondary | ICD-10-CM | POA: Diagnosis not present

## 2022-05-25 DIAGNOSIS — D5911 Warm autoimmune hemolytic anemia: Secondary | ICD-10-CM | POA: Diagnosis present

## 2022-05-25 DIAGNOSIS — I82403 Acute embolism and thrombosis of unspecified deep veins of lower extremity, bilateral: Secondary | ICD-10-CM | POA: Diagnosis not present

## 2022-05-25 DIAGNOSIS — Z836 Family history of other diseases of the respiratory system: Secondary | ICD-10-CM | POA: Insufficient documentation

## 2022-05-25 DIAGNOSIS — Z809 Family history of malignant neoplasm, unspecified: Secondary | ICD-10-CM | POA: Diagnosis not present

## 2022-05-25 DIAGNOSIS — Z86718 Personal history of other venous thrombosis and embolism: Secondary | ICD-10-CM | POA: Insufficient documentation

## 2022-05-25 DIAGNOSIS — Z7901 Long term (current) use of anticoagulants: Secondary | ICD-10-CM | POA: Diagnosis not present

## 2022-05-25 DIAGNOSIS — R42 Dizziness and giddiness: Secondary | ICD-10-CM | POA: Insufficient documentation

## 2022-05-25 DIAGNOSIS — R5383 Other fatigue: Secondary | ICD-10-CM | POA: Diagnosis not present

## 2022-05-25 DIAGNOSIS — E785 Hyperlipidemia, unspecified: Secondary | ICD-10-CM | POA: Diagnosis not present

## 2022-05-25 DIAGNOSIS — Z86711 Personal history of pulmonary embolism: Secondary | ICD-10-CM | POA: Diagnosis not present

## 2022-05-25 DIAGNOSIS — Z87891 Personal history of nicotine dependence: Secondary | ICD-10-CM | POA: Diagnosis not present

## 2022-05-25 DIAGNOSIS — I2782 Chronic pulmonary embolism: Secondary | ICD-10-CM | POA: Diagnosis not present

## 2022-05-25 DIAGNOSIS — Z79899 Other long term (current) drug therapy: Secondary | ICD-10-CM | POA: Insufficient documentation

## 2022-05-25 DIAGNOSIS — I2699 Other pulmonary embolism without acute cor pulmonale: Secondary | ICD-10-CM | POA: Diagnosis not present

## 2022-05-25 DIAGNOSIS — D5919 Other autoimmune hemolytic anemia: Secondary | ICD-10-CM

## 2022-05-25 DIAGNOSIS — Z8673 Personal history of transient ischemic attack (TIA), and cerebral infarction without residual deficits: Secondary | ICD-10-CM | POA: Diagnosis not present

## 2022-05-25 DIAGNOSIS — Z7902 Long term (current) use of antithrombotics/antiplatelets: Secondary | ICD-10-CM | POA: Diagnosis not present

## 2022-05-25 LAB — COMPREHENSIVE METABOLIC PANEL
ALT: 18 U/L (ref 0–44)
AST: 24 U/L (ref 15–41)
Albumin: 4.5 g/dL (ref 3.5–5.0)
Alkaline Phosphatase: 88 U/L (ref 38–126)
Anion gap: 8 (ref 5–15)
BUN: 29 mg/dL — ABNORMAL HIGH (ref 8–23)
CO2: 23 mmol/L (ref 22–32)
Calcium: 9.5 mg/dL (ref 8.9–10.3)
Chloride: 107 mmol/L (ref 98–111)
Creatinine, Ser: 1.83 mg/dL — ABNORMAL HIGH (ref 0.44–1.00)
GFR, Estimated: 29 mL/min — ABNORMAL LOW (ref >60)
Glucose, Bld: 89 mg/dL (ref 70–99)
Potassium: 4.2 mmol/L (ref 3.5–5.1)
Sodium: 138 mmol/L (ref 135–145)
Total Bilirubin: 0.9 mg/dL (ref 0.3–1.2)
Total Protein: 7.4 g/dL (ref 6.5–8.1)

## 2022-05-25 LAB — RETICULOCYTES
Immature Retic Fract: 18.6 % — ABNORMAL HIGH (ref 2.3–15.9)
RBC.: 3.95 MIL/uL (ref 3.87–5.11)
Retic Count, Absolute: 105.1 10*3/uL (ref 19.0–186.0)
Retic Ct Pct: 2.7 % (ref 0.4–3.1)

## 2022-05-25 LAB — CBC WITH DIFFERENTIAL/PLATELET
Abs Immature Granulocytes: 0.04 10*3/uL (ref 0.00–0.07)
Basophils Absolute: 0 10*3/uL (ref 0.0–0.1)
Basophils Relative: 1 %
Eosinophils Absolute: 0.2 10*3/uL (ref 0.0–0.5)
Eosinophils Relative: 2 %
HCT: 35.8 % — ABNORMAL LOW (ref 36.0–46.0)
Hemoglobin: 11.8 g/dL — ABNORMAL LOW (ref 12.0–15.0)
Immature Granulocytes: 1 %
Lymphocytes Relative: 21 %
Lymphs Abs: 1.7 10*3/uL (ref 0.7–4.0)
MCH: 29.7 pg (ref 26.0–34.0)
MCHC: 33 g/dL (ref 30.0–36.0)
MCV: 90.2 fL (ref 80.0–100.0)
Monocytes Absolute: 0.8 10*3/uL (ref 0.1–1.0)
Monocytes Relative: 10 %
Neutro Abs: 5.5 10*3/uL (ref 1.7–7.7)
Neutrophils Relative %: 65 %
Platelets: 282 10*3/uL (ref 150–400)
RBC: 3.97 MIL/uL (ref 3.87–5.11)
RDW: 13.2 % (ref 11.5–15.5)
WBC: 8.2 10*3/uL (ref 4.0–10.5)
nRBC: 0 % (ref 0.0–0.2)

## 2022-05-25 LAB — LACTATE DEHYDROGENASE: LDH: 142 U/L (ref 98–192)

## 2022-05-25 NOTE — Progress Notes (Unsigned)
Pts family member will like to discuss surgery options and regarding everyones opinions.

## 2022-05-26 LAB — HAPTOGLOBIN: Haptoglobin: 128 mg/dL (ref 37–355)

## 2022-05-27 ENCOUNTER — Encounter: Payer: Self-pay | Admitting: Oncology

## 2022-05-27 NOTE — Progress Notes (Signed)
Hematology/Oncology Consult note [facility]  [phone] [phone]  Patient Care Team: Derinda Late, MD as PCP - General (Family Medicine) Kate Sable, MD as PCP - Cardiology (Cardiology) [name], MD as Consulting Physician (Hematology and Oncology)   Name of the patient: Andrea Williamson  [id number]  08/04/1951   Date of visit: 05/27/22  Diagnosis- 1. Warm autoimmune hemolytic anemia 2. H/o DVT and PE  Chief complaint/ Reason for visit-routine follow-up of hemolytic anemia and history of DVT and PE  Heme/Onc history: patient is a 71 year old female with a past medical history significant for hypertension and hyperlipidemia who was admitted to [facility] for symptoms of acute stroke when she presented with right lower extremity weakness and difficulty speaking on 10/01/2021.  CT head showed acute infarct in the left frontal lobe ACA territory.  She underwent cerebral angiogram and underwent thrombectomy with revascularization and left P2 stenting by Dr. Norma Fredrickson and subsequently discharged to rehab.  She was started on aspirin and Brilinta.  During that admission patient had a normal CBC with a white count of 9.3, H&H of 13/37.8 and a platelet count of 245.  Patient was noted to have increasing icterus as well as nausea and dizziness which prompted her visit to the ER.  On admission patient was noted to have a white cell count of 22, H&H of 4.5/13.4 and MCV of 111 with a platelet count of 352.  Total bilirubin was elevated at 6 thereby raising the concern for possible hemolytic anemia.  Patient was started on high-dose steroids and transferred to [facility].  She received 1 dose of rituximab there.  CT chest abdomen and pelvis with contrast did not show any evidence of malignancy but did show a possible Right adnexal mass which will be followed up by GYN as an outpatient. Plans for surgery on hold due to recent DVT/PE.  Noted to have  bilateral lower extremity DVT as well for which she is on Eliquis.   She presented with nosebleeds to ER. Hb dropped to 8. No overt GI bleeding. Eliquis was held. Ivc filter was inserted. New segmental PE. Eliquis therefore restarted. She is also on plavix  Interval history-patient is doing well presently.  She is compliant with her Eliquis and Plavix  ECOG PS- 1 Pain scale- 0  Review of systems- Review of Systems  Constitutional:  Positive for malaise/fatigue. Negative for chills, fever and weight loss.  HENT:  Negative for congestion, ear discharge and nosebleeds.   Eyes:  Negative for blurred vision.  Respiratory:  Negative for cough, hemoptysis, sputum production, shortness of breath and wheezing.   Cardiovascular:  Negative for chest pain, palpitations, orthopnea and claudication.  Gastrointestinal:  Negative for abdominal pain, blood in stool, constipation, diarrhea, heartburn, melena, nausea and vomiting.  Genitourinary:  Negative for dysuria, flank pain, frequency, hematuria and urgency.  Musculoskeletal:  Negative for back pain, joint pain and myalgias.  Skin:  Negative for rash.  Neurological:  Negative for dizziness, tingling, focal weakness, seizures, weakness and headaches.  Endo/Heme/Allergies:  Does not bruise/bleed easily.  Psychiatric/Behavioral:  Negative for depression and suicidal ideas. The patient does not have insomnia.       No Known Allergies   Past Medical History:  Diagnosis Date   Deep vein thrombosis (DVT) ([place])    Hypertension    Stroke [facility])      Past Surgical History:  Procedure Laterality Date   ANKLE SURGERY Left 2008   IR CT  HEAD LTD  10/05/2021   IR INTRA CRAN STENT  10/01/2021   IR PERCUTANEOUS ART THROMBECTOMY/INFUSION INTRACRANIAL INC DIAG ANGIO  10/01/2021   IR US GUIDE VASC ACCESS RIGHT  10/01/2021   IVC FILTER INSERTION N/A 01/06/2022   Procedure: IVC FILTER INSERTION;  Surgeon: Algernon Huxley, MD;  Location: [place] CV [facility];   Service: Cardiovascular;  Laterality: N/A;   RADIOLOGY WITH ANESTHESIA N/A 10/01/2021   Procedure: IR WITH ANESTHESIA;  Surgeon: Radiologist, Medication, MD;  Location: [place];  Service: Radiology;  Laterality: N/A;    Social History   Socioeconomic History   Marital status: Married    Spouse name: Not on file   Number of children: 3   Years of education: 12   Highest education level: 12th grade  Occupational History   Occupation: Retired  Tobacco Use   Smoking status: Former    Packs/day: 1.50    Years: 50.00    Total pack years: 75.00    Types: Cigarettes    Quit date: 09/2021    Years since quitting: 0.6   Smokeless tobacco: Never  Vaping Use   Vaping Use: Never used  Substance and Sexual Activity   Alcohol use: Not Currently   Drug use: Not Currently   Sexual activity: Not Currently  Other Topics Concern   Not on file  Social History Narrative   October 2022 stopped   Social Determinants of Health   Financial Resource Strain: Low Risk  (11/26/2021)   Overall Financial Resource Strain (CARDIA)    Difficulty of Paying Living Expenses: Not hard at all  Food Insecurity: No Food Insecurity (11/26/2021)   Hunger Vital Sign    Worried About Running Out of Food in the Last Year: Never true    [place] in the Last Year: Never true  Transportation Needs: No Transportation Needs (11/26/2021)   PRAPARE - Hydrologist (Medical): No    Lack of Transportation (Non-Medical): No  Physical Activity: Insufficiently Active (11/26/2021)   Exercise Vital Sign    Days of Exercise per Week: 2 days    Minutes of Exercise per Session: 60 min  Stress: Stress Concern Present (11/26/2021)   [place]    Feeling of Stress : To some extent  Social Connections: Moderately Isolated (11/26/2021)   Social Connection and Isolation Panel [NHANES]    Frequency of Communication with Friends and  Family: More than three times a week    Frequency of Social Gatherings with Friends and Family: More than three times a week    Attends Religious Services: Never    Marine scientist or Organizations: No    Attends Archivist Meetings: Never    Marital Status: Married  Human resources officer Violence: Not At Risk (11/26/2021)   Humiliation, Afraid, Rape, and Kick questionnaire    Fear of Current or Ex-Partner: No    Emotionally Abused: No    Physically Abused: No    Sexually Abused: No    Family History  Problem Relation Age of Onset   COPD Mother    Cancer Mother      Current Outpatient Medications:    ALPRAZolam (XANAX) 0.25 MG tablet, TAKE 1 TABLET BY MOUTH AT BEDTIME, Disp: 30 tablet, Rfl: 0   amLODipine (NORVASC) 5 MG tablet, Take 1 tablet (5 mg total) by mouth daily., Disp: 90 tablet, Rfl: 1   citalopram (CELEXA) 10 MG tablet, Take  10 mg by mouth daily., Disp: , Rfl:    clopidogrel (PLAVIX) 75 MG tablet, Take 1 tablet (75 mg total) by mouth daily., Disp: 30 tablet, Rfl: 11   ELIQUIS 5 MG TABS tablet, TAKE 1 TABLET(5 MG) BY MOUTH TWICE DAILY, Disp: 60 tablet, Rfl: 1   ferrous sulfate 325 (65 FE) MG tablet, Take 325 mg by mouth daily with breakfast., Disp: , Rfl:    folic acid (FOLVITE) 1 MG tablet, Take 1 tablet (1 mg total) by mouth daily., Disp: , Rfl:    losartan (COZAAR) 25 MG tablet, Take 25 mg by mouth daily., Disp: , Rfl:    magnesium gluconate (MAGONATE) 500 MG tablet, Take 0.5 tablets (250 mg total) by mouth at bedtime., Disp: 30 tablet, Rfl: 0   metoprolol succinate (TOPROL XL) 25 MG 24 hr tablet, Take 1 tablet (25 mg total) by mouth daily., Disp: 30 tablet, Rfl: 11   pantoprazole (PROTONIX) 40 MG tablet, Take 1 tablet (40 mg total) by mouth daily., Disp: 90 tablet, Rfl: 1   REPATHA SURECLICK 782 MG/ML SOAJ, Inject 1 mL into the skin every 14 (fourteen) days., Disp: , Rfl:    hydrALAZINE (APRESOLINE) 25 MG tablet, Take 1 tablet (25 mg total) by mouth every 8  (eight) hours as needed (SBP >170 or DBP >110). (Patient not taking: Reported on 05/25/2022), Disp: 90 tablet, Rfl: 3   traZODone (DESYREL) 50 MG tablet, Take 50 mg by mouth at bedtime. (Patient not taking: Reported on 05/25/2022), Disp: , Rfl:   Physical exam:  Vitals:   05/25/22 1450  BP: 134/78  Pulse: 66  Resp: 16  Temp: (!) 97.2 F (36.2 C)  SpO2: 95%  Weight: 156 lb 8 oz (71 kg)   Physical Exam Constitutional:      General: She is not in acute distress. Cardiovascular:     Rate and Rhythm: Normal rate and regular rhythm.     Heart sounds: Normal heart sounds.  Pulmonary:     Effort: Pulmonary effort is normal.     Breath sounds: Normal breath sounds.  Skin:    General: Skin is warm and dry.  Neurological:     Mental Status: She is alert and oriented to person, place, and time.         Latest Ref Rng & Units 05/25/2022    2:27 PM  CMP  Glucose 70 - 99 mg/dL 89   BUN 8 - 23 mg/dL 29   Creatinine 0.44 - 1.00 mg/dL 1.83   Sodium 135 - 145 mmol/L 138   Potassium 3.5 - 5.1 mmol/L 4.2   Chloride 98 - 111 mmol/L 107   CO2 22 - 32 mmol/L 23   Calcium 8.9 - 10.3 mg/dL 9.5   Total Protein 6.5 - 8.1 g/dL 7.4   Total Bilirubin 0.3 - 1.2 mg/dL 0.9   Alkaline Phos 38 - 126 U/L 88   AST 15 - 41 U/L 24   ALT 0 - 44 U/L 18       Latest Ref Rng & Units 05/25/2022    2:27 PM  CBC  WBC 4.0 - 10.5 K/uL 8.2   Hemoglobin 12.0 - 15.0 g/dL 11.8   Hematocrit 36.0 - 46.0 % 35.8   Platelets 150 - 400 K/uL 282     No images are attached to the encounter.  US PELVIC COMPLETE WITH TRANSVAGINAL  Result Date: 05/03/2022 CLINICAL DATA:  Adnexal mass EXAM: TRANSABDOMINAL AND TRANSVAGINAL ULTRASOUND OF PELVIS TECHNIQUE: Both transabdominal and transvaginal  ultrasound examinations of the pelvis were performed. Transabdominal technique was performed for global imaging of the pelvis including uterus, ovaries, adnexal regions, and pelvic cul-de-sac. It was necessary to proceed with endovaginal  exam following the transabdominal exam to visualize the endometrium and ovaries. COMPARISON:  11/13/2021 FINDINGS: Uterus Measurements: 6.2 x 2.8 x 4.7 cm = volume: 40 mL. Anteverted. Heterogeneous myometrium with leiomyoma posterior LEFT 3.1 x 2.3 x 2.7 cm intramural potentially extending submucosal. Endometrium Thickness: 4 mm.  No endometrial fluid or mass Right ovary No normal appearing RIGHT ovary visualized, see below Left ovary Not visualized, likely obscured by bowel Other findings No free pelvic fluid. Complex cystic mass identified in RIGHT adnexa, potentially of RIGHT ovarian origin, 5.4 x 3.3 x 4.2 cm in size. Lesion contains a large solid appearing component which has internal blood flow on color Doppler imaging, proximally 3.7 cm greatest diameter as well as a complex cystic component 2.8 x 2.6 x 3.2 cm. The complex cyst contains a mural nodule 11 mm diameter which has internal blood flow on color Doppler imaging. 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 IMPRESSION: 3.1 cm diameter potentially submucosal leiomyoma LEFT uterus. 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 Electronically Signed   By: Lavonia Dana M.D.   On: 05/03/2022 09:29     Assessment and plan- Patient is a 71 y.o. female who is here for follow-up of following issues:  Warm autoimmune hemolytic anemia: hb stable between 11-12.  Haptoglobin LDH and reticulocyte count are normal and therefore not indicated of ongoing hemolysis.  History of DVT and PE: Patient was diagnosed with DVT when she had warm autoimmune embolic anemia in December 2022 which was likely the trigger.  She had some nosebleed following that and her Eliquis was briefly held when  she was found to have a subsegmental PE in February 2023.  She has an IVC filter in place.  She will continue to remain on Xarelto at this time and I will see her back in 3 months with repeat labs and consider stopping anticoagulation at that time  Complex cyst in the adnexa: Patient evaluated by GYN oncology Dr. Berline Lopes and will be undergoing surgery soon.  Since she is more than 3 months out of her segmental PE and has an IVC filter in place it would be okay for her to hold Xarelto 2 to 3 days prior to surgery and then resume Xarelto whenever it is deemed okay to restart anticoagulation from a surgical standpoint postop.   Visit Diagnosis 1. Other chronic pulmonary embolism without acute cor pulmonale (HCC)   2. Current use of long term anticoagulation   3. Warm autoimmune hemolytic anemia (HCC)      Dr. Randa Evens, MD, MPH [facility] [phone] 05/27/2022 8:11 AM

## 2022-05-28 NOTE — Telephone Encounter (Signed)
Any news on a surgery date?

## 2022-06-01 ENCOUNTER — Encounter: Payer: Self-pay | Admitting: Gynecologic Oncology

## 2022-06-02 ENCOUNTER — Encounter (HOSPITAL_COMMUNITY): Payer: Medicare Other

## 2022-06-02 ENCOUNTER — Telehealth: Payer: Self-pay

## 2022-06-02 NOTE — Patient Instructions (Signed)
Preparing for your Surgery  Plan for surgery on June 09, 2022 with Dr. Jeral Pinch at [facility] will be scheduled for robotic assisted laparoscopic bilateral salpingo-oophorectomy (removal of both ovaries and fallopian tubes through small incisions), possible staging if a cancer is identified including robotic assisted total hysterectomy (removal of the uterus and cervix), omentectomy (removal of the omentum), possible lymph node dissection, possible laparotomy (larger incision on your abdomen if needed).   YOU WILL NEED TO STOP YOUR ELIQUIS 3 DAYS BEFORE SURGERY WITH YOUR LAST DOSE BEING IN THE EVENING ON June 05, 2022 (SATURDAY).   YOU WILL NEED TO HOLD PLAVIX FOR 5 DAYS BEFORE SURGERY WITH LAST DOSE BEING ON June 03, 2022 (Thursday) IN THE AM. DO NOT RESUME THESE MEDICATIONS UNTIL CLEARED BY DR. Berline Lopes.  Pre-operative Testing -(DONE) You will receive a phone call from presurgical testing at [facility] to arrange for a pre-operative appointment and lab work.  -Bring your insurance card, copy of an advanced directive if applicable, medication list  -At that visit, you will be asked to sign a consent for a possible blood transfusion in case a transfusion becomes necessary during surgery.  The need for a blood transfusion is rare but having consent is a necessary part of your care.     -Do not take supplements such as fish oil (omega 3), red yeast rice, turmeric before your surgery. You want to avoid medications with aspirin in them including headache powders such as BC or Goody's), Excedrin migraine.  Day Before Surgery at [facility] will be asked to take in a light diet the day before surgery. You will be advised you can have clear liquids up until 3 hours before your surgery.    Eat a light diet the day before surgery.  Examples including soups, broths, toast, yogurt, mashed potatoes.  AVOID GAS PRODUCING FOODS. Things to avoid include carbonated beverages (fizzy  beverages, sodas), raw fruits and raw vegetables (uncooked), or beans.   If your bowels are filled with gas, your surgeon will have difficulty visualizing your pelvic organs which increases your surgical risks.  Your role in recovery Your role is to become active as soon as directed by your doctor, while still giving yourself time to heal.  Rest when you feel tired. You will be asked to do the following in order to speed your recovery:  - Cough and breathe deeply. This helps to clear and expand your lungs and can prevent pneumonia after surgery.  - [place]. Do mild physical activity. Walking or moving your legs help your circulation and body functions return to normal. Do not try to get up or walk alone the first time after surgery.   -If you develop swelling on one leg or the other, pain in the back of your leg, redness/warmth in one of your legs, please call the office or go to the Emergency Room to have a doppler to rule out a blood clot. For shortness of breath, chest pain-seek care in the Emergency Room as soon as possible. - Actively manage your pain. Managing your pain lets you move in comfort. We will ask you to rate your pain on a scale of zero to 10. It is your responsibility to tell your doctor or nurse where and how much you hurt so your pain can be treated.  Special Considerations -If you are diabetic, you may be placed on insulin after surgery to have closer control over your blood  sugars to promote healing and recovery.  This does not mean that you will be discharged on insulin.  If applicable, your oral antidiabetics will be resumed when you are tolerating a solid diet.  -Your final pathology results from surgery should be available around one week after surgery and the results will be relayed to you when available.  -FMLA forms can be faxed to [phone] and please allow 5-7 business days for completion.  Pain Management After Surgery -You have been  prescribed your pain medication (tramadol) and bowel regimen medications before surgery so that you can have these available when you are discharged from the hospital. The pain medication is for use ONLY AFTER surgery and a new prescription will not be given.   -Make sure that you have Tylenol IF YOU ARE ABLE TO TAKE THESE MEDICATION at home to use on a regular basis after surgery for pain control.   -Review the attached handout on narcotic use and their risks and side effects.   Bowel Regimen -You have been prescribed Sennakot-S to take nightly to prevent constipation especially if you are taking the narcotic pain medication intermittently.  It is important to prevent constipation and drink adequate amounts of liquids. You can stop taking this medication when you are not taking pain medication and you are back on your normal bowel routine.  Risks of Surgery Risks of surgery are low but include bleeding, infection, damage to surrounding structures, re-operation, blood clots, and very rarely death.   Blood Transfusion Information (For the consent to be signed before surgery)  We will be checking your blood type before surgery so in case of emergencies, we will know what type of blood you would need.                                            WHAT IS A BLOOD TRANSFUSION?  A transfusion is the replacement of blood or some of its parts. Blood is made up of multiple cells which provide different functions. Red blood cells carry oxygen and are used for blood loss replacement. White blood cells fight against infection. Platelets control bleeding. Plasma helps clot blood. Other blood products are available for specialized needs, such as hemophilia or other clotting disorders. BEFORE THE TRANSFUSION  Who gives blood for transfusions?  You may be able to donate blood to be used at a later date on yourself (autologous donation). Relatives can be asked to donate blood. This is generally not any safer  than if you have received blood from a stranger. The same precautions are taken to ensure safety when a relative's blood is donated. Healthy volunteers who are fully evaluated to make sure their blood is safe. This is blood bank blood. Transfusion therapy is the safest it has ever been in the practice of medicine. Before blood is taken from a donor, a complete history is taken to make sure that person has no history of diseases nor engages in risky social behavior (examples are intravenous drug use or sexual activity with multiple partners). The donor's travel history is screened to minimize risk of transmitting infections, such as malaria. The donated blood is tested for signs of infectious diseases, such as HIV and hepatitis. The blood is then tested to be sure it is compatible with you in order to minimize the chance of a transfusion reaction. If you or a relative donates blood,  this is often done in anticipation of surgery and is not appropriate for emergency situations. It takes many days to process the donated blood. RISKS AND COMPLICATIONS Although transfusion therapy is very safe and saves many lives, the main dangers of transfusion include:  Getting an infectious disease. Developing a transfusion reaction. This is an allergic reaction to something in the blood you were given. Every precaution is taken to prevent this. The decision to have a blood transfusion has been considered carefully by your caregiver before blood is given. Blood is not given unless the benefits outweigh the risks.  AFTER SURGERY INSTRUCTIONS  Return to work: 4-6 weeks if applicable  Activity: 1. Be up and out of the bed during the day.  Take a nap if needed.  You may walk up steps but be careful and use the hand rail.  Stair climbing will tire you more than you think, you may need to stop part way and rest.   2. No lifting or straining for 6 weeks over 10 pounds. No pushing, pulling, straining for 6 weeks.  3. No  driving for around 1 week(s) if you were cleared to drive before surgery.  Do not drive if you are taking narcotic pain medicine and make sure that your reaction time has returned.   4. You can shower as soon as the next day after surgery. Shower daily.  Use your regular soap and water (not directly on the incision) and pat your incision(s) dry afterwards; don't rub.  No tub baths or submerging your body in water until cleared by your surgeon. If you have the soap that was given to you by pre-surgical testing that was used before surgery, you do not need to use it afterwards because this can irritate your incisions.   5. No sexual activity and nothing in the vagina for 4 weeks, 8 weeks if you have a hysterectomy.  6. You may experience a small amount of clear drainage from your incisions, which is normal.  If the drainage persists, increases, or changes color please call the office.  7. Do not use creams, lotions, or ointments such as neosporin on your incisions after surgery until advised by your surgeon because they can cause removal of the dermabond glue on your incisions.    8. You may experience vaginal spotting after surgery or around the 6-8 week mark from surgery when the stitches at the top of the vagina begin to dissolve (if you have a hysterectomy).  The spotting is normal but if you experience heavy bleeding, call our office.  9. Take Tylenol first for pain if you are able to take these medications and only use narcotic pain medication for severe pain not relieved by the Tylenol.  Monitor your Tylenol intake to a max of 4,000 mg in a 24 hour period.   Diet: 1. Low sodium Heart Healthy Diet is recommended but you are cleared to resume your normal (before surgery) diet after your procedure.  2. It is safe to use a laxative, such as Miralax or Colace, if you have difficulty moving your bowels. You have been prescribed Sennakot-S to take at bedtime every evening after surgery to keep bowel  movements regular and to prevent constipation.    Wound Care: 1. Keep clean and dry.  Shower daily.  Reasons to call the Doctor: Fever - Oral temperature greater than 100.4 degrees Fahrenheit Foul-smelling vaginal discharge Difficulty urinating Nausea and vomiting Increased pain at the site of the incision that is unrelieved  with pain medicine. Difficulty breathing with or without chest pain New calf pain especially if only on one side Sudden, continuing increased vaginal bleeding with or without clots.   Contacts: For questions or concerns you should contact:  Dr. Jeral Pinch at [phone]  Joylene John, NP at [phone]  After Hours: call [phone] and have the GYN Oncologist paged/contacted (after 5 pm or on the weekends).  Messages sent via mychart are for non-urgent matters and are not responded to after hours so for urgent needs, please call the after hours number.

## 2022-06-02 NOTE — Progress Notes (Signed)
Patient here with family for a pre-operative appointment prior to her scheduled surgery on June 09, 2022. She is scheduled for robotic assisted laparoscopic bilateral salpingo-oophorectomy, possible staging if a cancer is identified including robotic assisted total hysterectomy, possible lymph node dissection, possible laparotomy. She has had her pre-admission testing appointment at [facility].  The surgery was discussed in detail.  See after visit summary for additional details. Visual aids used to discuss items related to surgery including sequential compression stockings, foley catheter, IV pump, multi-modal pain regimen including tylenol, photo of the surgical robot, female reproductive system to discuss surgery in detail.      Discussed post-op pain management in detail including the aspects of the enhanced recovery pathway.  Advised her that a new prescription would be sent in for tramadol and it is only to be used for after her upcoming surgery.  We discussed the use of tylenol post-op and to monitor for a maximum of 4,000 mg in a 24 hour period.  Also prescribed sennakot to be used after surgery and to hold if having loose stools.  Discussed bowel regimen in detail.     Discussed the use of heparin pre-op, SCDs, and measures to take at home to prevent DVT including frequent mobility.  Reportable signs and symptoms of DVT discussed. Post-operative instructions discussed and expectations for after surgery. Incisional care discussed as well including reportable signs and symptoms including erythema, drainage, wound separation.     10 minutes spent with the patient.  Verbalizing understanding of material discussed. No needs or concerns voiced at the end of the visit.   Advised patient and family to call for any needs.  Advised that her post-operative medications had been prescribed and could be picked up at any time.    We reviewed instructions on holding Eliquis and Plavix before surgery. Patient  verbalizing understanding.   This appointment is included in the global surgical bundle as pre-operative teaching and has no charge.

## 2022-06-02 NOTE — Telephone Encounter (Signed)
Spoke with patient and reviewed pre-op medication recommendations.  Patient is to hold Eliquis 3 days prior to surgery. Last dose will be on the evening of July 22nd (Saturday).  Plavix will need to be held 5 days prior to surgery. Last dose will be July 20th (Thursday). Patient verbalized understanding, instructed to call with any needs.

## 2022-06-02 NOTE — Progress Notes (Addendum)
COVID Vaccine Completed: yes  Date of COVID positive in last 90 days: no  PCP - Derinda Late, MD Cardiologist - Flossie Buffy, MD  Chest x-ray - CT 01/12/22 Epic EKG - 02/04/22 Epic Stress Test - n/a ECHO - 01/10/22 Epic  Cardiac Cath - n/a Pacemaker/ICD device last checked: n/a Spinal Cord Stimulator: n/a  Bowel Prep - light diet the day before surgery  Sleep Study - n/a CPAP -   Fasting Blood Sugar - n/a Checks Blood Sugar _____ times a day  Blood Thinner Instructions: Plavix, Eliquis. Hold Eliquis 3 days. Hold Plavix for 5 days Aspirin Instructions: Last Dose: Plavix 06/03/22 0700, Eliquis 06/05/22 1900  Activity level: Can go up a flight of stairs and perform activities of daily living without stopping and without symptoms of chest pain or shortness of breath.    Anesthesia review: HTN DVT, stroke thrombectomy, PE  Patient denies shortness of breath, fever, cough and chest pain at PAT appointment  Patient verbalized understanding of instructions that were given to them at the PAT appointment. Patient was also instructed that they will need to review over the PAT instructions again at home before surgery.

## 2022-06-03 ENCOUNTER — Encounter (HOSPITAL_COMMUNITY): Payer: Self-pay

## 2022-06-03 ENCOUNTER — Encounter (HOSPITAL_COMMUNITY)
Admission: RE | Admit: 2022-06-03 | Discharge: 2022-06-03 | Disposition: A | Discharge status: Home / Self Care | Payer: Medicare Other | Source: Ambulatory Visit | Attending: Gynecologic Oncology | Admitting: Gynecologic Oncology

## 2022-06-03 ENCOUNTER — Ambulatory Visit: Payer: Medicare Other | Admitting: Gynecologic Oncology

## 2022-06-03 VITALS — BP 151/80 | HR 113 | Temp 98.0°F | Resp 14 | Ht 62.0 in | Wt 157.0 lb

## 2022-06-03 DIAGNOSIS — Z8673 Personal history of transient ischemic attack (TIA), and cerebral infarction without residual deficits: Secondary | ICD-10-CM | POA: Insufficient documentation

## 2022-06-03 DIAGNOSIS — Z01812 Encounter for preprocedural laboratory examination: Secondary | ICD-10-CM | POA: Insufficient documentation

## 2022-06-03 DIAGNOSIS — I10 Essential (primary) hypertension: Secondary | ICD-10-CM | POA: Insufficient documentation

## 2022-06-03 DIAGNOSIS — Z86718 Personal history of other venous thrombosis and embolism: Secondary | ICD-10-CM | POA: Insufficient documentation

## 2022-06-03 DIAGNOSIS — N9489 Other specified conditions associated with female genital organs and menstrual cycle: Secondary | ICD-10-CM | POA: Diagnosis not present

## 2022-06-03 DIAGNOSIS — D589 Hereditary hemolytic anemia, unspecified: Secondary | ICD-10-CM | POA: Diagnosis not present

## 2022-06-03 DIAGNOSIS — Z87891 Personal history of nicotine dependence: Secondary | ICD-10-CM | POA: Insufficient documentation

## 2022-06-03 DIAGNOSIS — Z01818 Encounter for other preprocedural examination: Secondary | ICD-10-CM

## 2022-06-03 HISTORY — DX: Hereditary hemolytic anemia, unspecified: D58.9

## 2022-06-03 HISTORY — DX: Unspecified atherosclerosis: I70.90

## 2022-06-03 LAB — COMPREHENSIVE METABOLIC PANEL
ALT: 18 U/L (ref 0–44)
AST: 19 U/L (ref 15–41)
Albumin: 4.1 g/dL (ref 3.5–5.0)
Alkaline Phosphatase: 75 U/L (ref 38–126)
Anion gap: 7 (ref 5–15)
BUN: 28 mg/dL — ABNORMAL HIGH (ref 8–23)
CO2: 24 mmol/L (ref 22–32)
Calcium: 9.6 mg/dL (ref 8.9–10.3)
Chloride: 113 mmol/L — ABNORMAL HIGH (ref 98–111)
Creatinine, Ser: 1.21 mg/dL — ABNORMAL HIGH (ref 0.44–1.00)
GFR, Estimated: 48 mL/min — ABNORMAL LOW (ref >60)
Glucose, Bld: 87 mg/dL (ref 70–99)
Potassium: 4.1 mmol/L (ref 3.5–5.1)
Sodium: 144 mmol/L (ref 135–145)
Total Bilirubin: 0.4 mg/dL (ref 0.3–1.2)
Total Protein: 7.1 g/dL (ref 6.5–8.1)

## 2022-06-03 LAB — CBC
HCT: 35.6 % — ABNORMAL LOW (ref 36.0–46.0)
Hemoglobin: 11.4 g/dL — ABNORMAL LOW (ref 12.0–15.0)
MCH: 30.1 pg (ref 26.0–34.0)
MCHC: 32 g/dL (ref 30.0–36.0)
MCV: 93.9 fL (ref 80.0–100.0)
Platelets: 276 10*3/uL (ref 150–400)
RBC: 3.79 MIL/uL — ABNORMAL LOW (ref 3.87–5.11)
RDW: 13.4 % (ref 11.5–15.5)
WBC: 5.8 10*3/uL (ref 4.0–10.5)
nRBC: 0 % (ref 0.0–0.2)

## 2022-06-03 NOTE — Patient Instructions (Addendum)
DUE TO COVID-19 ONLY TWO VISITORS  (aged 71 and older)  ARE ALLOWED TO COME WITH YOU AND STAY IN THE WAITING ROOM ONLY DURING PRE OP AND PROCEDURE.   **NO VISITORS ARE ALLOWED IN THE SHORT STAY AREA OR RECOVERY ROOM!!**    Your procedure is scheduled on: 06/09/22   Report to [facility] Main Entrance    Report to admitting at 12:00 PM   Call this number if you have problems the morning of surgery [phone]   Do not eat food :After Midnight.   After Midnight you may have the following liquids until 11:10 AM DAY OF SURGERY  Water Black Coffee (sugar ok, NO MILK/CREAM OR CREAMERS)  Tea (sugar ok, NO MILK/CREAM OR CREAMERS) regular and decaf                             Plain Jell-O (NO RED)                                           Fruit ices (not with fruit pulp, NO RED)                                     Popsicles (NO RED)                                                                  Juice: apple, WHITE grape, WHITE cranberry Sports drinks like Gatorade (NO RED)              FOLLOW BOWEL PREP AND ANY ADDITIONAL PRE OP INSTRUCTIONS YOU RECEIVED FROM YOUR SURGEON'S OFFICE!!!     Oral Hygiene is also important to reduce your risk of infection.                                    Remember - BRUSH YOUR TEETH THE MORNING OF SURGERY WITH YOUR REGULAR TOOTHPASTE   Take these medicines the morning of surgery with A SIP OF WATER: Amlodipine, Citalopram, Metoprolol, Pantoprazole                               You may not have any metal on your body including hair pins, jewelry, and body piercing             Do not wear make-up, lotions, powders, perfumes, or deodorant  Do not wear nail polish including gel and S&S, artificial/acrylic nails, or any other type of covering on natural nails including finger and toenails. If you have artificial nails, gel coating, etc. that needs to be removed by a nail salon please have this removed prior to surgery or surgery may need to be canceled/  delayed if the surgeon/ anesthesia feels like they are unable to be safely monitored.   Do not shave  48 hours prior to surgery.    Do not bring valuables to the hospital. [facility] IS NOT  RESPONSIBLE   FOR VALUABLES.   Contacts, dentures or bridgework may not be worn into surgery.  DO NOT [place]. PHARMACY WILL DISPENSE MEDICATIONS LISTED ON YOUR MEDICATION LIST TO YOU DURING YOUR ADMISSION [place]!    Patients discharged on the day of surgery will not be allowed to drive home.  Someone NEEDS to stay with you for the first 24 hours after anesthesia.              Please read over the following fact sheets you were given: IF YOU HAVE QUESTIONS ABOUT YOUR PRE-OP INSTRUCTIONS PLEASE CALL [facility] - Preparing for Surgery Before surgery, you can play an important role.  Because skin is not sterile, your skin needs to be as free of germs as possible.  You can reduce the number of germs on your skin by washing with CHG (chlorahexidine gluconate) soap before surgery.  CHG is an antiseptic cleaner which kills germs and bonds with the skin to continue killing germs even after washing. Please DO NOT use if you have an allergy to CHG or antibacterial soaps.  If your skin becomes reddened/irritated stop using the CHG and inform your nurse when you arrive at Short Stay. Do not shave (including legs and underarms) for at least 48 hours prior to the first CHG shower.  You may shave your face/neck.  Please follow these instructions carefully:  1.  Shower with CHG Soap the night before surgery and the  morning of surgery.  2.  If you choose to wash your hair, wash your hair first as usual with your normal  shampoo.  3.  After you shampoo, rinse your hair and body thoroughly to remove the shampoo.                             4.  Use CHG as you would any other liquid soap.  You can apply chg directly to the skin and wash.   Gently with a scrungie or clean washcloth.  5.  Apply the CHG Soap to your body ONLY FROM THE NECK DOWN.   Do   not use on face/ open                           Wound or open sores. Avoid contact with eyes, ears mouth and   genitals (private parts).                       Wash face,  Genitals (private parts) with your normal soap.             6.  Wash thoroughly, paying special attention to the area where your    surgery  will be performed.  7.  Thoroughly rinse your body with warm water from the neck down.  8.  DO NOT shower/wash with your normal soap after using and rinsing off the CHG Soap.                9.  Pat yourself dry with a clean towel.            10.  Wear clean pajamas.            11.  Place clean sheets on your bed the night of your first shower and do not  sleep with pets. Day of  Surgery : Do not apply any lotions/deodorants the morning of surgery.  Please wear clean clothes to the hospital/surgery center.  FAILURE TO FOLLOW THESE INSTRUCTIONS MAY RESULT IN THE CANCELLATION OF YOUR SURGERY  PATIENT SIGNATURE_________________________________  NURSE SIGNATURE__________________________________  ________________________________________________________________________  WHAT IS A BLOOD TRANSFUSION? Blood Transfusion Information  A transfusion is the replacement of blood or some of its parts. Blood is made up of multiple cells which provide different functions. Red blood cells carry oxygen and are used for blood loss replacement. White blood cells fight against infection. Platelets control bleeding. Plasma helps clot blood. Other blood products are available for specialized needs, such as hemophilia or other clotting disorders. BEFORE THE TRANSFUSION  Who gives blood for transfusions?  Healthy volunteers who are fully evaluated to make sure their blood is safe. This is blood bank blood. Transfusion therapy is the safest it has ever been in the practice of medicine. Before blood  is taken from a donor, a complete history is taken to make sure that person has no history of diseases nor engages in risky social behavior (examples are intravenous drug use or sexual activity with multiple partners). The donor's travel history is screened to minimize risk of transmitting infections, such as malaria. The donated blood is tested for signs of infectious diseases, such as HIV and hepatitis. The blood is then tested to be sure it is compatible with you in order to minimize the chance of a transfusion reaction. If you or a relative donates blood, this is often done in anticipation of surgery and is not appropriate for emergency situations. It takes many days to process the donated blood. RISKS AND COMPLICATIONS Although transfusion therapy is very safe and saves many lives, the main dangers of transfusion include:  Getting an infectious disease. Developing a transfusion reaction. This is an allergic reaction to something in the blood you were given. Every precaution is taken to prevent this. The decision to have a blood transfusion has been considered carefully by your caregiver before blood is given. Blood is not given unless the benefits outweigh the risks. AFTER THE TRANSFUSION Right after receiving a blood transfusion, you will usually feel much better and more energetic. This is especially true if your red blood cells have gotten low (anemic). The transfusion raises the level of the red blood cells which carry oxygen, and this usually causes an energy increase. The nurse administering the transfusion will monitor you carefully for complications. HOME CARE INSTRUCTIONS  No special instructions are needed after a transfusion. You may find your energy is better. Speak with your caregiver about any limitations on activity for underlying diseases you may have. SEEK MEDICAL CARE IF:  Your condition is not improving after your transfusion. You develop redness or irritation at the intravenous  (IV) site. SEEK IMMEDIATE MEDICAL CARE IF:  Any of the following symptoms occur over the next 12 hours: Shaking chills. You have a temperature by mouth above 102 F (38.9 C), not controlled by medicine. Chest, back, or muscle pain. People around you feel you are not acting correctly or are confused. Shortness of breath or difficulty breathing. Dizziness and fainting. You get a rash or develop hives. You have a decrease in urine output. Your urine turns a dark color or changes to pink, red, or brown. Any of the following symptoms occur over the next 10 days: You have a temperature by mouth above 102 F (38.9 C), not controlled by medicine. Shortness of breath. Weakness after normal activity. The white part  of the eye turns yellow (jaundice). You have a decrease in the amount of urine or are urinating less often. Your urine turns a dark color or changes to pink, red, or brown. Document Released: 10/29/2000 Document Revised: 01/24/2012 Document Reviewed: 06/17/2008 [facility] Patient Information 2014 [place], [place].  _______________________________________________________________________

## 2022-06-04 ENCOUNTER — Other Ambulatory Visit: Payer: Self-pay

## 2022-06-04 ENCOUNTER — Inpatient Hospital Stay: Payer: Medicare Other | Attending: Oncology | Admitting: Gynecologic Oncology

## 2022-06-04 VITALS — BP 165/83 | HR 54 | Temp 98.2°F | Resp 16 | Ht 62.0 in | Wt 160.7 lb

## 2022-06-04 DIAGNOSIS — N9489 Other specified conditions associated with female genital organs and menstrual cycle: Secondary | ICD-10-CM

## 2022-06-04 MED ORDER — TRAMADOL HCL 50 MG PO TABS: 50.0000 mg | ORAL_TABLET | Freq: Two times a day (BID) | ORAL | 0 refills | Status: DC | PRN

## 2022-06-04 MED ORDER — SENNOSIDES-DOCUSATE SODIUM 8.6-50 MG PO TABS: 2.0000 | ORAL_TABLET | Freq: Every day | ORAL | 0 refills | Status: DC

## 2022-06-04 NOTE — Progress Notes (Signed)
Anesthesia Chart Review   Case: [id number] Date/Time: 06/09/22 1355   Procedure: XI ROBOTIC ASSISTED BILATERAL SALPINGO OOPHORECTOMY, POSSIBLE LAPAROTOMY, POSSIBLE STAGING INCLUDING TOTAL HYSTERECTOMY, POSSIBLE LYMPH NODE DISSECTION (Bilateral)   Anesthesia type: General   Pre-op diagnosis: ADNEXAL MASS   Location: [facility] ROOM 05 / [facility]   Surgeons: [name], MD       DISCUSSION:71 y.o. former smoker with h/o HTN, Stroke, bilateral DVT,  CVA s/p thrombectomy and left A2 stent 09/2021, hemolytic anemia, adnexal mass scheduled for above procedure 06/09/2022 with Dr. Jeral Pinch.   Per hematologist and neurologist pt is acceptable risk for planned procedure.  Advised to hold Eliquis 3 days prior to surgery and Plavix 5 days prior to surgery.   Anticipate pt can proceed with planned procedure barring acute status change.   VS: BP (!) 151/80   Pulse (!) 113   Temp 36.7 C (Oral)   Resp 14   Ht '5\' 2"'$  (1.575 m)   Wt 71.2 kg   SpO2 97%   BMI 28.72 kg/m   PROVIDERS: Derinda Late, MD is PCP   Cardiologist - Flossie Buffy, MD LABS: Labs reviewed: Acceptable for surgery. (all labs ordered are listed, but only abnormal results are displayed)  Labs Reviewed  CBC - Abnormal; Notable for the following components:      Result Value   RBC 3.79 (*)    Hemoglobin 11.4 (*)    HCT 35.6 (*)    All other components within normal limits  COMPREHENSIVE METABOLIC PANEL - Abnormal; Notable for the following components:   Chloride 113 (*)    BUN 28 (*)    Creatinine, Ser 1.21 (*)    GFR, Estimated 48 (*)    All other components within normal limits  TYPE AND SCREEN     IMAGES:   EKG: 02/04/2022 Rate 70 bpm  Sinus rhythm with occasional premature ventricular complexes  CV: Echo 01/10/2022  1. Left ventricular ejection fraction, by estimation, is 65 to 70%. The  left ventricle has normal function. The left ventricle has no regional  wall motion abnormalities. Left  ventricular diastolic parameters are  consistent with Grade I diastolic  dysfunction (impaired relaxation).   2. Right ventricular systolic function is normal. The right ventricular  size is normal.   3. The mitral valve is grossly normal. No evidence of mitral valve  regurgitation.   4. The aortic valve is normal in structure. Aortic valve regurgitation is  not visualized. Past Medical History:  Diagnosis Date   Atherosclerosis    Deep vein thrombosis (DVT) ([place])    Hemolytic anemia ([place])    Hypertension    Stroke [facility])     Past Surgical History:  Procedure Laterality Date   ANKLE SURGERY Left 2008   IR CT HEAD LTD  10/05/2021   IR INTRA CRAN STENT  10/01/2021   IR PERCUTANEOUS ART THROMBECTOMY/INFUSION INTRACRANIAL INC DIAG ANGIO  10/01/2021   IR US GUIDE VASC ACCESS RIGHT  10/01/2021   IVC FILTER INSERTION N/A 01/06/2022   Procedure: IVC FILTER INSERTION;  Surgeon: Algernon Huxley, MD;  Location: [facility] CV LAB;  Service: Cardiovascular;  Laterality: N/A;   RADIOLOGY WITH ANESTHESIA N/A 10/01/2021   Procedure: IR WITH ANESTHESIA;  Surgeon: Radiologist, Medication, MD;  Location: [facility];  Service: Radiology;  Laterality: N/A;    MEDICATIONS:  ALPRAZolam (XANAX) 0.25 MG tablet   amLODipine (NORVASC) 5 MG tablet   citalopram (CELEXA) 10 MG tablet   clopidogrel (PLAVIX) 75  MG tablet   ELIQUIS 5 MG TABS tablet   ferrous sulfate 325 (65 FE) MG tablet   folic acid (FOLVITE) 1 MG tablet   hydrALAZINE (APRESOLINE) 25 MG tablet   losartan (COZAAR) 25 MG tablet   magnesium gluconate (MAGONATE) 500 MG tablet   metoprolol succinate (TOPROL XL) 25 MG 24 hr tablet   pantoprazole (PROTONIX) 40 MG tablet   REPATHA SURECLICK 694 MG/ML SOAJ   senna-docusate (SENOKOT-S) 8.6-50 MG tablet   traMADol (ULTRAM) 50 MG tablet   No current facility-administered medications for this encounter.    Konrad Felix Ward, PA-C [facility]-Surgical Testing [phone]

## 2022-06-08 ENCOUNTER — Telehealth: Payer: Self-pay

## 2022-06-08 NOTE — Telephone Encounter (Signed)
Telephone call to check on pre-operative status.  Patient compliant with pre-operative instructions.  Reinforced nothing to eat after midnight. Clear liquids until 11:10. Patient to arrive at 12:00 pm.  No questions or concerns voiced.  Instructed to call for any needs.

## 2022-06-09 ENCOUNTER — Other Ambulatory Visit: Payer: Self-pay

## 2022-06-09 ENCOUNTER — Encounter (HOSPITAL_COMMUNITY)
Admission: RE | Disposition: A | Discharge status: Home / Self Care | Payer: Self-pay | Source: Home / Self Care | Attending: Gynecologic Oncology

## 2022-06-09 ENCOUNTER — Encounter (HOSPITAL_COMMUNITY): Payer: Self-pay | Admitting: Gynecologic Oncology

## 2022-06-09 ENCOUNTER — Ambulatory Visit (HOSPITAL_COMMUNITY): Payer: Medicare Other | Admitting: Anesthesiology

## 2022-06-09 ENCOUNTER — Inpatient Hospital Stay (HOSPITAL_COMMUNITY)
Admission: RE | Admit: 2022-06-09 | Discharge: 2022-06-09 | DRG: 743 | Disposition: A | Discharge status: Home / Self Care | Payer: Medicare Other | Attending: Gynecologic Oncology | Admitting: Gynecologic Oncology

## 2022-06-09 ENCOUNTER — Ambulatory Visit (HOSPITAL_COMMUNITY): Payer: Medicare Other | Admitting: Physician Assistant

## 2022-06-09 DIAGNOSIS — R1900 Intra-abdominal and pelvic swelling, mass and lump, unspecified site: Secondary | ICD-10-CM | POA: Diagnosis not present

## 2022-06-09 DIAGNOSIS — N9489 Other specified conditions associated with female genital organs and menstrual cycle: Principal | ICD-10-CM | POA: Diagnosis present

## 2022-06-09 DIAGNOSIS — N838 Other noninflammatory disorders of ovary, fallopian tube and broad ligament: Secondary | ICD-10-CM

## 2022-06-09 DIAGNOSIS — I10 Essential (primary) hypertension: Secondary | ICD-10-CM | POA: Diagnosis present

## 2022-06-09 DIAGNOSIS — D3911 Neoplasm of uncertain behavior of right ovary: Secondary | ICD-10-CM

## 2022-06-09 DIAGNOSIS — Z87891 Personal history of nicotine dependence: Secondary | ICD-10-CM

## 2022-06-09 DIAGNOSIS — D259 Leiomyoma of uterus, unspecified: Secondary | ICD-10-CM | POA: Diagnosis not present

## 2022-06-09 DIAGNOSIS — Z8673 Personal history of transient ischemic attack (TIA), and cerebral infarction without residual deficits: Secondary | ICD-10-CM | POA: Diagnosis not present

## 2022-06-09 DIAGNOSIS — Z01818 Encounter for other preprocedural examination: Secondary | ICD-10-CM

## 2022-06-09 DIAGNOSIS — Z95828 Presence of other vascular implants and grafts: Secondary | ICD-10-CM | POA: Diagnosis not present

## 2022-06-09 DIAGNOSIS — Z86718 Personal history of other venous thrombosis and embolism: Secondary | ICD-10-CM | POA: Diagnosis not present

## 2022-06-09 DIAGNOSIS — Z79899 Other long term (current) drug therapy: Secondary | ICD-10-CM | POA: Diagnosis not present

## 2022-06-09 HISTORY — PX: ROBOTIC ASSISTED BILATERAL SALPINGO OOPHERECTOMY: SHX6078

## 2022-06-09 LAB — TYPE AND SCREEN
ABO/RH(D): O POS
Antibody Screen: NEGATIVE
Unit division: 0
Unit division: 0

## 2022-06-09 LAB — BPAM RBC
Blood Product Expiration Date: 202308242359
Blood Product Expiration Date: 202308242359
Unit Type and Rh: 9500
Unit Type and Rh: 9500

## 2022-06-09 SURGERY — SALPINGO-OOPHORECTOMY, BILATERAL, ROBOT-ASSISTED
Anesthesia: General | Laterality: Bilateral

## 2022-06-09 MED ORDER — HEPARIN SODIUM (PORCINE) 5000 UNIT/ML IJ SOLN
5000.0000 [IU] | INTRAMUSCULAR | Status: AC
  Administered 2022-06-09: 5000 [IU] via SUBCUTANEOUS
  Filled 2022-06-09: qty 1

## 2022-06-09 MED ORDER — LACTATED RINGERS IV SOLN: INTRAVENOUS | Status: DC

## 2022-06-09 MED ORDER — ROCURONIUM BROMIDE 100 MG/10ML IV SOLN
INTRAVENOUS | Status: DC | PRN
  Administered 2022-06-09 (×2): 20 mg via INTRAVENOUS
  Administered 2022-06-09: 60 mg via INTRAVENOUS

## 2022-06-09 MED ORDER — STERILE WATER FOR IRRIGATION IR SOLN
Status: DC | PRN
  Administered 2022-06-09: 1000 mL

## 2022-06-09 MED ORDER — OXYCODONE HCL 5 MG PO TABS: 5.0000 mg | ORAL_TABLET | ORAL | Status: DC | PRN

## 2022-06-09 MED ORDER — ACETAMINOPHEN 10 MG/ML IV SOLN: 1000.0000 mg | Freq: Once | INTRAVENOUS | Status: DC | PRN

## 2022-06-09 MED ORDER — DEXAMETHASONE SODIUM PHOSPHATE 4 MG/ML IJ SOLN
4.0000 mg | INTRAMUSCULAR | Status: AC
  Administered 2022-06-09: 8 mg via INTRAVENOUS

## 2022-06-09 MED ORDER — ORAL CARE MOUTH RINSE: 15.0000 mL | Freq: Once | OROMUCOSAL | Status: AC

## 2022-06-09 MED ORDER — TRAMADOL HCL 50 MG PO TABS: 50.0000 mg | ORAL_TABLET | Freq: Four times a day (QID) | ORAL | Status: DC | PRN

## 2022-06-09 MED ORDER — FENTANYL CITRATE (PF) 100 MCG/2ML IJ SOLN
INTRAMUSCULAR | Status: AC
  Filled 2022-06-09: qty 2

## 2022-06-09 MED ORDER — LIDOCAINE HCL (PF) 2 % IJ SOLN
INTRAMUSCULAR | Status: DC | PRN
  Administered 2022-06-09: 1.5 mg/kg/h via INTRADERMAL

## 2022-06-09 MED ORDER — ONDANSETRON HCL 4 MG/2ML IJ SOLN
INTRAMUSCULAR | Status: AC
  Filled 2022-06-09: qty 2

## 2022-06-09 MED ORDER — ACETAMINOPHEN 160 MG/5ML PO SOLN: 325.0000 mg | Freq: Once | ORAL | Status: DC | PRN

## 2022-06-09 MED ORDER — KETAMINE HCL 10 MG/ML IJ SOLN
INTRAMUSCULAR | Status: AC
  Filled 2022-06-09: qty 1

## 2022-06-09 MED ORDER — LIDOCAINE HCL (CARDIAC) PF 100 MG/5ML IV SOSY
PREFILLED_SYRINGE | INTRAVENOUS | Status: DC | PRN
  Administered 2022-06-09: 50 mg via INTRAVENOUS

## 2022-06-09 MED ORDER — BUPIVACAINE HCL 0.25 % IJ SOLN
INTRAMUSCULAR | Status: DC | PRN
  Administered 2022-06-09: 50 mL

## 2022-06-09 MED ORDER — ACETAMINOPHEN 500 MG PO TABS
1000.0000 mg | ORAL_TABLET | ORAL | Status: AC
  Administered 2022-06-09: 1000 mg via ORAL
  Filled 2022-06-09: qty 2

## 2022-06-09 MED ORDER — SUGAMMADEX SODIUM 200 MG/2ML IV SOLN
INTRAVENOUS | Status: DC | PRN
  Administered 2022-06-09: 200 mg via INTRAVENOUS

## 2022-06-09 MED ORDER — CEFAZOLIN SODIUM 1 G IJ SOLR
INTRAMUSCULAR | Status: AC
  Filled 2022-06-09: qty 20

## 2022-06-09 MED ORDER — PROPOFOL 10 MG/ML IV BOLUS
INTRAVENOUS | Status: DC | PRN
  Administered 2022-06-09: 100 mg via INTRAVENOUS

## 2022-06-09 MED ORDER — FENTANYL CITRATE PF 50 MCG/ML IJ SOSY
25.0000 ug | PREFILLED_SYRINGE | INTRAMUSCULAR | Status: DC | PRN
  Administered 2022-06-09: 50 ug via INTRAVENOUS

## 2022-06-09 MED ORDER — DEXAMETHASONE SODIUM PHOSPHATE 10 MG/ML IJ SOLN
INTRAMUSCULAR | Status: AC
  Filled 2022-06-09: qty 1

## 2022-06-09 MED ORDER — MIDAZOLAM HCL 5 MG/5ML IJ SOLN
INTRAMUSCULAR | Status: DC | PRN
  Administered 2022-06-09: 2 mg via INTRAVENOUS

## 2022-06-09 MED ORDER — CEFAZOLIN SODIUM-DEXTROSE 2-3 GM-%(50ML) IV SOLR
INTRAVENOUS | Status: DC | PRN
  Administered 2022-06-09: 2 g via INTRAVENOUS

## 2022-06-09 MED ORDER — CHLORHEXIDINE GLUCONATE 0.12 % MT SOLN
15.0000 mL | Freq: Once | OROMUCOSAL | Status: AC
  Administered 2022-06-09: 15 mL via OROMUCOSAL

## 2022-06-09 MED ORDER — FENTANYL CITRATE (PF) 100 MCG/2ML IJ SOLN
INTRAMUSCULAR | Status: DC | PRN
  Administered 2022-06-09 (×4): 25 ug via INTRAVENOUS

## 2022-06-09 MED ORDER — EPHEDRINE SULFATE-NACL 50-0.9 MG/10ML-% IV SOSY
PREFILLED_SYRINGE | INTRAVENOUS | Status: DC | PRN
  Administered 2022-06-09 (×2): 5 mg via INTRAVENOUS

## 2022-06-09 MED ORDER — LIDOCAINE HCL (PF) 2 % IJ SOLN
INTRAMUSCULAR | Status: AC
  Filled 2022-06-09: qty 10

## 2022-06-09 MED ORDER — AMISULPRIDE (ANTIEMETIC) 5 MG/2ML IV SOLN: 10.0000 mg | Freq: Once | INTRAVENOUS | Status: DC | PRN

## 2022-06-09 MED ORDER — PROPOFOL 10 MG/ML IV BOLUS
INTRAVENOUS | Status: AC
  Filled 2022-06-09: qty 20

## 2022-06-09 MED ORDER — LACTATED RINGERS IR SOLN
Status: DC | PRN
  Administered 2022-06-09: 1000 mL

## 2022-06-09 MED ORDER — ACETAMINOPHEN 325 MG PO TABS: 325.0000 mg | ORAL_TABLET | Freq: Once | ORAL | Status: DC | PRN

## 2022-06-09 MED ORDER — ONDANSETRON HCL 4 MG/2ML IJ SOLN
INTRAMUSCULAR | Status: DC | PRN
  Administered 2022-06-09: 4 mg via INTRAVENOUS

## 2022-06-09 MED ORDER — ROCURONIUM BROMIDE 10 MG/ML (PF) SYRINGE
PREFILLED_SYRINGE | INTRAVENOUS | Status: AC
  Filled 2022-06-09: qty 10

## 2022-06-09 MED ORDER — FENTANYL CITRATE PF 50 MCG/ML IJ SOSY
PREFILLED_SYRINGE | INTRAMUSCULAR | Status: AC
  Filled 2022-06-09: qty 2

## 2022-06-09 MED ORDER — ONDANSETRON HCL 4 MG PO TABS: 4.0000 mg | ORAL_TABLET | Freq: Four times a day (QID) | ORAL | Status: DC | PRN

## 2022-06-09 MED ORDER — MIDAZOLAM HCL 2 MG/2ML IJ SOLN
INTRAMUSCULAR | Status: AC
  Filled 2022-06-09: qty 2

## 2022-06-09 MED ORDER — BUPIVACAINE HCL 0.25 % IJ SOLN
INTRAMUSCULAR | Status: AC
  Filled 2022-06-09: qty 1

## 2022-06-09 MED ORDER — ONDANSETRON HCL 4 MG/2ML IJ SOLN: 4.0000 mg | Freq: Four times a day (QID) | INTRAMUSCULAR | Status: DC | PRN

## 2022-06-09 SURGICAL SUPPLY — 76 items
APPLICATOR SURGIFLO ENDO (HEMOSTASIS) IMPLANT
BAG LAPAROSCOPIC 12 15 PORT 16 (BASKET) IMPLANT
BAG RETRIEVAL 12/15 (BASKET) ×2
BLADE SURG SZ10 CARB STEEL (BLADE) IMPLANT
COVER BACK TABLE 60X90IN (DRAPES) ×2 IMPLANT
COVER TIP SHEARS 8 DVNC (MISCELLANEOUS) ×1 IMPLANT
COVER TIP SHEARS 8MM DA VINCI (MISCELLANEOUS) ×2
DERMABOND ADVANCED (GAUZE/BANDAGES/DRESSINGS) ×1
DERMABOND ADVANCED .7 DNX12 (GAUZE/BANDAGES/DRESSINGS) ×1 IMPLANT
DRAPE ARM DVNC X/XI (DISPOSABLE) ×4 IMPLANT
DRAPE COLUMN DVNC XI (DISPOSABLE) ×1 IMPLANT
DRAPE DA VINCI XI ARM (DISPOSABLE) ×4
DRAPE DA VINCI XI COLUMN (DISPOSABLE) ×1
DRAPE SHEET LG 3/4 BI-LAMINATE (DRAPES) ×2 IMPLANT
DRAPE SURG IRRIG POUCH 19X23 (DRAPES) ×2 IMPLANT
DRSG OPSITE POSTOP 4X6 (GAUZE/BANDAGES/DRESSINGS) IMPLANT
DRSG OPSITE POSTOP 4X8 (GAUZE/BANDAGES/DRESSINGS) IMPLANT
ELECT PENCIL ROCKER SW 15FT (MISCELLANEOUS) IMPLANT
ELECT REM PT RETURN 15FT ADLT (MISCELLANEOUS) ×2 IMPLANT
GAUZE 4X4 16PLY ~~LOC~~+RFID DBL (SPONGE) ×4 IMPLANT
GLOVE BIO SURGEON STRL SZ 6 (GLOVE) ×8 IMPLANT
GLOVE BIO SURGEON STRL SZ 6.5 (GLOVE) IMPLANT
GOWN STRL REUS W/ TWL LRG LVL3 (GOWN DISPOSABLE) ×4 IMPLANT
GOWN STRL REUS W/TWL LRG LVL3 (GOWN DISPOSABLE) ×4
HOLDER FOLEY CATH W/STRAP (MISCELLANEOUS) IMPLANT
IRRIG SUCT STRYKERFLOW 2 WTIP (MISCELLANEOUS) ×2
IRRIGATION SUCT STRKRFLW 2 WTP (MISCELLANEOUS) ×1 IMPLANT
KIT PROCEDURE DA VINCI SI (MISCELLANEOUS)
KIT PROCEDURE DVNC SI (MISCELLANEOUS) IMPLANT
KIT TURNOVER KIT A (KITS) IMPLANT
LIGASURE IMPACT 36 18CM CVD LR (INSTRUMENTS) IMPLANT
MANIPULATOR ADVINCU DEL 3.0 PL (MISCELLANEOUS) IMPLANT
MANIPULATOR ADVINCU DEL 3.5 PL (MISCELLANEOUS) IMPLANT
MANIPULATOR UTERINE 4.5 ZUMI (MISCELLANEOUS) ×1 IMPLANT
NDL HYPO 21X1.5 SAFETY (NEEDLE) ×1 IMPLANT
NDL SPNL 18GX3.5 QUINCKE PK (NEEDLE) IMPLANT
NEEDLE HYPO 21X1.5 SAFETY (NEEDLE) ×2 IMPLANT
NEEDLE SPNL 18GX3.5 QUINCKE PK (NEEDLE) IMPLANT
OBTURATOR OPTICAL STANDARD 8MM (TROCAR) ×1
OBTURATOR OPTICAL STND 8 DVNC (TROCAR) ×1
OBTURATOR OPTICALSTD 8 DVNC (TROCAR) ×1 IMPLANT
PACK ROBOT GYN CUSTOM WL (TRAY / TRAY PROCEDURE) ×2 IMPLANT
PAD POSITIONING PINK XL (MISCELLANEOUS) ×2 IMPLANT
PORT ACCESS TROCAR AIRSEAL 12 (TROCAR) ×1 IMPLANT
PORT ACCESS TROCAR AIRSEAL 5M (TROCAR) ×1
SCRUB CHG 4% DYNA-HEX 4OZ (MISCELLANEOUS) ×4 IMPLANT
SEAL CANN UNIV 5-8 DVNC XI (MISCELLANEOUS) ×4 IMPLANT
SEAL XI 5MM-8MM UNIVERSAL (MISCELLANEOUS) ×4
SEALER VESSEL DA VINCI XI (MISCELLANEOUS) ×1
SEALER VESSEL EXT DVNC XI (MISCELLANEOUS) IMPLANT
SET TRI-LUMEN FLTR TB AIRSEAL (TUBING) ×2 IMPLANT
SPIKE FLUID TRANSFER (MISCELLANEOUS) ×2 IMPLANT
SPONGE T-LAP 18X18 ~~LOC~~+RFID (SPONGE) IMPLANT
SURGIFLO W/THROMBIN 8M KIT (HEMOSTASIS) IMPLANT
SUT MNCRL AB 4-0 PS2 18 (SUTURE) ×1 IMPLANT
SUT PDS AB 1 TP1 96 (SUTURE) IMPLANT
SUT VIC AB 0 CT1 27 (SUTURE) ×1
SUT VIC AB 0 CT1 27XBRD ANTBC (SUTURE) IMPLANT
SUT VIC AB 2-0 CT1 27 (SUTURE)
SUT VIC AB 2-0 CT1 TAPERPNT 27 (SUTURE) IMPLANT
SUT VIC AB 2-0 SH 27 (SUTURE) ×1
SUT VIC AB 2-0 SH 27X BRD (SUTURE) IMPLANT
SUT VIC AB 4-0 PS2 18 (SUTURE) ×4 IMPLANT
SUT VICRYL 0 UR6 27IN ABS (SUTURE) IMPLANT
SYR 10ML LL (SYRINGE) IMPLANT
SYS BAG RETRIEVAL 10MM (BASKET)
SYS WOUND ALEXIS 18CM MED (MISCELLANEOUS)
SYSTEM BAG RETRIEVAL 10MM (BASKET) IMPLANT
SYSTEM WOUND ALEXIS 18CM MED (MISCELLANEOUS) IMPLANT
TOWEL OR NON WOVEN STRL DISP B (DISPOSABLE) IMPLANT
TRAP SPECIMEN MUCUS 40CC (MISCELLANEOUS) ×1 IMPLANT
TRAY FOLEY MTR SLVR 16FR STAT (SET/KITS/TRAYS/PACK) ×2 IMPLANT
TROCAR Z-THREAD FIOS 5X100MM (TROCAR) IMPLANT
UNDERPAD 30X36 HEAVY ABSORB (UNDERPADS AND DIAPERS) ×4 IMPLANT
WATER STERILE IRR 1000ML POUR (IV SOLUTION) ×2 IMPLANT
YANKAUER SUCT BULB TIP 10FT TU (MISCELLANEOUS) IMPLANT

## 2022-06-09 NOTE — Anesthesia Procedure Notes (Signed)
Procedure Name: Intubation Date/Time: 06/09/2022 1:20 PM  Performed by: Jecenia Leamer, Forest Gleason, CRNAPre-anesthesia Checklist: Patient identified, Emergency Drugs available, Suction available, Patient being monitored and Timeout performed Patient Re-evaluated:Patient Re-evaluated prior to induction Oxygen Delivery Method: Circle system utilized Preoxygenation: Pre-oxygenation with 100% oxygen Induction Type: IV induction Ventilation: Mask ventilation without difficulty Laryngoscope Size: Mac and 4 Grade View: Grade I Tube type: Oral Tube size: 7.0 mm Number of attempts: 1 Airway Equipment and Method: Stylet Placement Confirmation: ETT inserted through vocal cords under direct vision, positive ETCO2, breath sounds checked- equal and bilateral and CO2 detector Secured at: 21 cm Tube secured with: Tape Dental Injury: Teeth and Oropharynx as per pre-operative assessment

## 2022-06-09 NOTE — Transfer of Care (Signed)
Immediate Anesthesia Transfer of Care Note  Patient: Andrea Williamson  Procedure(s) Performed: XI ROBOTIC ASSISTED BILATERAL SALPINGO OOPHORECTOMY, STAGING INCLUDING TOTAL HYSTERECTOMY, OMENTECTOMY PERITONEAL BIOPSIES (Bilateral)  Patient Location: PACU  Anesthesia Type:General  Level of Consciousness: drowsy  Airway & Oxygen Therapy: Patient Spontanous Breathing and Patient connected to face mask oxygen  Post-op Assessment: Report given to RN and Post -op Vital signs reviewed and stable  Post vital signs: Reviewed and stable  Last Vitals:  Vitals Value Taken Time  BP 154/73 06/09/22 1545  Temp    Pulse 51 06/09/22 1546  Resp 14 06/09/22 1546  SpO2 100 % 06/09/22 1546  Vitals shown include unvalidated device data.  Last Pain:  Vitals:   06/09/22 1047  TempSrc:   PainSc: 0-No pain         Complications: No notable events documented.

## 2022-06-09 NOTE — Discharge Instructions (Addendum)
AFTER SURGERY INSTRUCTIONS   Return to work: 4-6 weeks if applicable  Do not restart taking plavix for one week. Do not restart taking xarelto for 3 days after surgery.   Activity: 1. Be up and out of the bed during the day.  Take a nap if needed.  You may walk up steps but be careful and use the hand rail.  Stair climbing will tire you more than you think, you may need to stop part way and rest.    2. No lifting or straining for 6 weeks over 10 pounds. No pushing, pulling, straining for 6 weeks.   3. No driving for around 1 week(s) if you were cleared to drive before surgery.  Do not drive if you are taking narcotic pain medicine and make sure that your reaction time has returned.    4. You can shower as soon as the next day after surgery. Shower daily.  Use your regular soap and water (not directly on the incision) and pat your incision(s) dry afterwards; don't rub.  No tub baths or submerging your body in water until cleared by your surgeon. If you have the soap that was given to you by pre-surgical testing that was used before surgery, you do not need to use it afterwards because this can irritate your incisions.    5. No sexual activity and nothing in the vagina for 4 weeks, 8 weeks if you have a hysterectomy.   6. You may experience a small amount of clear drainage from your incisions, which is normal.  If the drainage persists, increases, or changes color please call the office.   7. Do not use creams, lotions, or ointments such as neosporin on your incisions after surgery until advised by your surgeon because they can cause removal of the dermabond glue on your incisions.     8. You may experience vaginal spotting after surgery or around the 6-8 week mark from surgery when the stitches at the top of the vagina begin to dissolve (if you have a hysterectomy).  The spotting is normal but if you experience heavy bleeding, call our office.   9. Take Tylenol first for pain if you are able to  take these medications and only use narcotic pain medication for severe pain not relieved by the Tylenol.  Monitor your Tylenol intake to a max of 4,000 mg in a 24 hour period.    Diet: 1. Low sodium Heart Healthy Diet is recommended but you are cleared to resume your normal (before surgery) diet after your procedure.   2. It is safe to use a laxative, such as Miralax or Colace, if you have difficulty moving your bowels. You have been prescribed Sennakot-S to take at bedtime every evening after surgery to keep bowel movements regular and to prevent constipation.     Wound Care: 1. Keep clean and dry.  Shower daily.   Reasons to call the Doctor: Fever - Oral temperature greater than 100.4 degrees Fahrenheit Foul-smelling vaginal discharge Difficulty urinating Nausea and vomiting Increased pain at the site of the incision that is unrelieved with pain medicine. Difficulty breathing with or without chest pain New calf pain especially if only on one side Sudden, continuing increased vaginal bleeding with or without clots.   Contacts: For questions or concerns you should contact:   Dr. Jeral Pinch at [phone]   Joylene John, NP at [phone]   After Hours: call [phone] and have the GYN Oncologist paged/contacted (after 5 pm or on  the weekends).   Messages sent via mychart are for non-urgent matters and are not responded to after hours so for urgent needs, please call the after hours number.

## 2022-06-09 NOTE — Anesthesia Postprocedure Evaluation (Deleted)
Anesthesia Post Note  Patient: Andrea Williamson  Procedure(s) Performed: XI ROBOTIC ASSISTED BILATERAL SALPINGO OOPHORECTOMY, STAGING INCLUDING TOTAL HYSTERECTOMY, OMENTECTOMY PERITONEAL BIOPSIES (Bilateral)     Patient location during evaluation: PACU Anesthesia Type: General Level of consciousness: awake and alert Pain management: pain level controlled Vital Signs Assessment: post-procedure vital signs reviewed and stable Respiratory status: spontaneous breathing, nonlabored ventilation, respiratory function stable and patient connected to nasal cannula oxygen Cardiovascular status: blood pressure returned to baseline and stable Postop Assessment: no apparent nausea or vomiting Anesthetic complications: no   No notable events documented.  Last Vitals:  Vitals:   06/09/22 1032 06/09/22 1545  BP: (!) 168/87 (!) 154/73  Pulse:  (!) 54  Resp:  12  Temp:  36.4 C  SpO2:  100%    Last Pain:  Vitals:   06/09/22 1047  TempSrc:   PainSc: 0-No pain                 Effie Berkshire

## 2022-06-09 NOTE — Anesthesia Postprocedure Evaluation (Signed)
Anesthesia Post Note  Patient: JAMERIA BRADWAY  Procedure(s) Performed: XI ROBOTIC ASSISTED BILATERAL SALPINGO OOPHORECTOMY, STAGING INCLUDING TOTAL HYSTERECTOMY, OMENTECTOMY PERITONEAL BIOPSIES (Bilateral)     Patient location during evaluation: PACU Anesthesia Type: General Level of consciousness: awake and alert Pain management: pain level controlled Vital Signs Assessment: post-procedure vital signs reviewed and stable Respiratory status: spontaneous breathing, nonlabored ventilation, respiratory function stable and patient connected to nasal cannula oxygen Cardiovascular status: blood pressure returned to baseline and stable Postop Assessment: no apparent nausea or vomiting Anesthetic complications: no   No notable events documented.  Last Vitals:  Vitals:   06/09/22 1615 06/09/22 1630  BP: (!) 158/73 (!) 152/73  Pulse: (!) 52 (!) 55  Resp: 13 11  Temp:    SpO2: 100% 100%    Last Pain:  Vitals:   06/09/22 1615  TempSrc:   PainSc: Asleep                 Effie Berkshire

## 2022-06-09 NOTE — Anesthesia Preprocedure Evaluation (Addendum)
Anesthesia Evaluation  Patient identified by MRN, date of birth, ID band Patient awake    Reviewed: Allergy & Precautions, NPO status , Patient's Chart, lab work & pertinent test results  Airway Mallampati: II  TM Distance: >3 FB Neck ROM: Full    Dental  (+) Dental Advisory Given, Edentulous Upper, Edentulous Lower   Pulmonary former smoker,    breath sounds clear to auscultation       Cardiovascular hypertension, Pt. on medications and Pt. on home beta blockers  Rhythm:Regular Rate:Normal     Neuro/Psych CVA negative psych ROS   GI/Hepatic Neg liver ROS, GERD  Medicated,  Endo/Other  negative endocrine ROS  Renal/GU      Musculoskeletal negative musculoskeletal ROS (+)   Abdominal Normal abdominal exam  (+)   Peds  Hematology negative hematology ROS (+)   Anesthesia Other Findings   Reproductive/Obstetrics                            Anesthesia Physical Anesthesia Plan  ASA: 3  Anesthesia Plan: General   Post-op Pain Management:    Induction: Intravenous  PONV Risk Score and Plan: 4 or greater and Ondansetron, Dexamethasone, Scopolamine patch - Pre-op and Midazolam  Airway Management Planned: Oral ETT  Additional Equipment: None  Intra-op Plan:   Post-operative Plan: Extubation in OR  Informed Consent: I have reviewed the patients History and Physical, chart, labs and discussed the procedure including the risks, benefits and alternatives for the proposed anesthesia with the patient or authorized representative who has indicated his/her understanding and acceptance.     Dental advisory given  Plan Discussed with: CRNA  Anesthesia Plan Comments:        Anesthesia Quick Evaluation

## 2022-06-09 NOTE — Op Note (Signed)
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  Mobile, approximately 6 cm adnexal mass in the cul-de-sac and to the right.  On intra-abdominal entry, normal upper abdominal survey including stomach, liver edge, and diaphragm.  Normal-appearing omentum, small and large bowel.  Right ovary with a approximately 3 cm smooth appearing cystic lesion and second solid-appearing lesion measuring approximately 2 cm, vesicular in appearance.  Normal-appearing right fallopian tube.  Normal-appearing left adnexa.  Uterus approximately 8 cm and normal in appearance.  No ascites.  No peritoneal evidence of disease with the exception of some very small vesicular lesions on the anterior uterine serosa and bladder peritoneum (removed), suspected to be endosalpingosis versus implants of borderline tumor.  No obvious adenopathy.  Estimated Blood Loss:  50 cc      Total IV Fluids: see I&O flowsheet         Specimens: uterus, cervix, bilateral tubes and ovaries, infracolic omentum, peritoneal biopsies, pelvic washings         Complications:  None apparent; patient tolerated the procedure well.         Disposition: PACU - hemodynamically stable.  Procedure Details  The patient was seen in the Holding Room. The risks, benefits, complications, treatment options, and expected outcomes were discussed with the patient.  The patient concurred with the proposed plan, giving informed consent.  The site of surgery properly noted/marked. The patient was identified as Andrea Williamson and the procedure verified as a  Robotic-assisted hysterectomy with bilateral salpingo oophorectomy, possible staging.   After induction of anesthesia, the patient was draped and prepped in the usual sterile manner. Patient was placed in supine position after anesthesia and draped and prepped in the usual sterile manner as follows: Her arms were tucked to her side with all appropriate precautions.  The shoulders were stabilized with padded shoulder blocks applied to the acromium processes.  The patient was placed in the semi-lithotomy position in [place].  The perineum and vagina were prepped with CholoraPrep. The patient was draped after the CholoraPrep had been allowed to dry for 3 minutes.  A Time Out was held and the above information confirmed.  The urethra was prepped with Betadine. Foley catheter was placed.  A sterile speculum was placed in the vagina.  The cervix was grasped with a single-tooth tenaculum. The cervix was dilated with Kennon Rounds dilators.  The ZUMI uterine manipulator with a medium colpotomizer ring was placed without difficulty.  A pneum occluder balloon was placed over the manipulator.  OG tube placement was confirmed and to suction.   Next, a 10 mm skin incision was made 1 cm below the subcostal margin in the midclavicular line.  The 5 mm Optiview port and scope was used for direct entry.  Opening pressure was under 10 mm CO2.  The abdomen was insufflated and the findings were noted as above.   At this point and all points during the procedure, the patient's intra-abdominal pressure did not exceed 15 mmHg. Next, an 8 mm skin incision was made superior to the umbilicus and a right and left port were placed about 8 cm lateral to the robot port on the right and left side.  A fourth  arm was placed on the right.  The 5 mm assist trocar was exchanged for a 10-12 mm port. All ports were placed under direct visualization.  The patient was placed in steep Trendelenburg.  Bowel was folded away into the upper abdomen.  The  robot was docked in the normal manner.  The right peritoneum was opened parallel to the IP ligament to open the retroperitoneal space.  The ureter was noted along the medial leaf of the broad ligament.  Peritoneum above the ureter was incised and stretched and the infundibulopelvic ligament was skeletonized, cauterized, and cut.  The fallopian tube and ureter ovarian ligament were skeletonized, cauterized and cut, freeing the right adnexa.  Right adnexa was placed in an Endo Catch bag.  Robotic instruments were removed and the camera was placed in the right lateral port.  Under direct visualization, the bag was brought up to and through the assist trocar.  Contained cyst drainage was then performed and contained morcellation until the entire bag could be removed.  The right adnexa was then sent for frozen section.  Given the appearance of the right ovary and concern for at least borderline tumor, decision made to proceed with contralateral salpingo-oophorectomy and total hysterectomy.  The left peritoneum was opened parallel to the IP ligament to open the retroperitoneal space.  Round ligaments were both transected. The left ureter was noted to be on the medial leaf of the broad ligament.  The peritoneum above the ureter was incised and stretched and the infundibulopelvic ligament was skeletonized, cauterized and cut.    The posterior peritoneum was taken down to the level of the KOH ring bilaterally.  The anterior peritoneum was also taken down.  The bladder flap was created to the level of the KOH ring.  The uterine artery on the right side was skeletonized, cauterized and cut in the normal manner.  A similar procedure was performed on the left.  The colpotomy was made and the uterus, cervix, bilateral ovaries and tubes were amputated and delivered through the vagina.  Pedicles were inspected and excellent hemostasis was achieved.    Peritoneal biopsies were taken and handed out through the assist  trocar.  Table motion was used to flatten the patient and place her in 10 degrees of reverse Trendelenburg.  An infracolic omentectomy was then performed using a combination of monopolar electrocautery and the vessel sealer.  Once freed, the omentum was placed in the Endo Catch bag.  Again with 2 instruments in place and under visualization the entire time, table motion was used to place the patient in Trendelenburg again.  The omentum was handed out through the vagina.  The colpotomy at the vaginal cuff was closed with Vicryl on a CT1 needle in a running manner.  A figure of 8 stitch using 2-0 Vicryl was used to oversew an area just anterior to the cuff closure along the left given some oozing.  Good hemostasis was noted.  Irrigation was used and excellent hemostasis was achieved.  At this point in the procedure was completed.  Robotic instruments were removed under direct visulaization.  The robot was undocked. The fascia at the 10-12 mm port was closed with 0 Vicryl on a UR-5 needle.  The subcuticular tissue was closed with 4-0 Vicryl and the skin was closed with 4-0 Monocryl in a subcuticular manner.  Dermabond was applied.    The vagina was swabbed with minimal bleeding noted.  Foley catheter was removed.  All sponge, lap and needle counts were  correct x  3.   The patient was transferred to the recovery room in stable condition.  Jeral Pinch, MD

## 2022-06-09 NOTE — H&P (Signed)
Gynecologic Oncology H&P  06/09/22  Treatment History: Andrea Williamson is a 71 year old female with a medical history that includes HTN, CVA s/p left A2 thrombectomy with stent placement 10/01/2021 who presented to the [facility] ER with symptoms of nausea, weakness, dizziness for the past week. Ct imaging of the head was negative for acute intracranial abnormality with small chronic appearing left ACA infarcts. 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  CT AP with contrast on 11/13/2021 revealed a solid and cystic right adnexal mass measuring 4.9 cms, non specific stranding in the RUQ of the abd with associated sub-centimeter lymph nodes. 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   To further evaluate the ovarian mass, she underwent a pelvic/transvaginal ultrasound with the impression showing a complex lesion in the right adnexa with mostly cystic echogenicity along with a lobulated solid nodule measuring 1.4 cm in size with demonstrable internal vascularity. Possibility of malignant neoplastic process in the right ovary is not excluded. 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 Left ovary is not sonographically visualized.   Ca1 25 was normal at 21.5.   Pelvic MRI was performed on 1/4 showing a complex solid and cystic 2.9 x 3 cm right ovarian mass with T2 hyperintensity and T1 hyperintensity.  Anteriorly there is a 1 cm enhancing soft tissue component which is corresponds to the ultrasound area of vascularity.   Pelvic MRI was performed on 02/17/2022 which showed stable 3.2 cm complex cystic mass involving the right ovary with 1 cm solid mural nodule, highly suspicious for cystic ovarian neoplasm.  Interval History: Doing well, denies new symptoms since our last phone visit.   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[place])    Hypertension    Stroke [facility])     Past Surgical History:  Procedure Laterality Date   ANKLE SURGERY Left 2008   IR CT HEAD LTD  10/05/2021   IR INTRA CRAN STENT  10/01/2021   IR PERCUTANEOUS ART THROMBECTOMY/INFUSION INTRACRANIAL INC DIAG ANGIO  10/01/2021   IR US GUIDE VASC ACCESS RIGHT  10/01/2021   IVC FILTER INSERTION N/A 01/06/2022   Procedure: IVC FILTER INSERTION;  Surgeon: Algernon Huxley, MD;  Location: [place] [facility];  Service: Cardiovascular;  Laterality: N/A;   RADIOLOGY WITH ANESTHESIA N/A 10/01/2021   Procedure: IR WITH ANESTHESIA;  Surgeon: Radiologist, Medication, MD;  Location: [place];  Service: Radiology;  Laterality: N/A;    Family History  Problem Relation Age of Onset   COPD Mother    Cancer Mother    Colon cancer Neg Hx    Breast cancer Neg Hx    Ovarian cancer Neg Hx    Endometrial cancer Neg Hx    Prostate cancer Neg Hx    Pancreatic cancer Neg Hx     Social History   Socioeconomic History   Marital status: Married    Spouse name: Not on file   Number of children: 3   Years of education: 12   Highest education level: 12th grade  Occupational History   Occupation: Retired  Tobacco Use   Smoking status: Former    Packs/day: 1.50    Years: 50.00    Total pack years: 75.00    Types: Cigarettes  Quit date: 09/2021    Years since quitting: 0.7   Smokeless tobacco: Never  Vaping Use   Vaping Use: Never used  Substance and Sexual Activity   Alcohol use: Not Currently   Drug use: Not Currently   Sexual activity: Not Currently  Other Topics Concern   Not on file  Social History Narrative   October 2022 stopped   Social Determinants of Health   Financial Resource Strain: Low Risk  (11/26/2021)   Overall Financial Resource Strain (CARDIA)    Difficulty of Paying Living Expenses: Not hard at all  Food Insecurity: No Food Insecurity (11/26/2021)    Hunger Vital Sign    Worried About Running Out of Food in the Last Year: Never true    Ran Out of Food in the Last Year: Never true  Transportation Needs: No Transportation Needs (11/26/2021)   PRAPARE - Hydrologist (Medical): No    Lack of Transportation (Non-Medical): No  Physical Activity: Insufficiently Active (11/26/2021)   Exercise Vital Sign    Days of Exercise per Week: 2 days    Minutes of Exercise per Session: 60 min  Stress: Stress Concern Present (11/26/2021)   [place]    Feeling of Stress : To some extent  Social Connections: Moderately Isolated (11/26/2021)   Social Connection and Isolation Panel [NHANES]    Frequency of Communication with Friends and Family: More than three times a week    Frequency of Social Gatherings with Friends and Family: More than three times a week    Attends Religious Services: Never    Marine scientist or Organizations: No    Attends Music therapist: Never    Marital Status: Married    Current Medications:  Current Facility-Administered Medications:    dexamethasone (DECADRON) injection 4 mg, 4 mg, Intravenous, On Call to OR, Cross, Melissa D, NP   lactated ringers infusion, , Intravenous, Continuous, Hodierne, Adam, MD, Last Rate: 10 mL/hr at 06/09/22 1045, New Bag at 06/09/22 1045   Physical Exam: BP (!) 168/87   Pulse (!) 53   Temp 98 F (36.7 C) (Oral)   Resp 16   Ht '5\' 2"'$  (1.575 m)   Wt 160 lb 11.5 oz (72.9 kg)   SpO2 99%   BMI 29.40 kg/m  General: Alert, oriented, no acute distress. HEENT: Normocephalic, atraumatic, sclera anicteric. Chest: Clear to auscultation bilaterally.  No wheezes. Cardiovascular: Regular rate and rhythm, no murmurs. Abdomen: soft, nontender.  Normoactive bowel sounds.  No masses or hepatosplenomegaly appreciated.   Extremities: Grossly normal range of motion.  Warm, well perfused.  No  edema bilaterally.  Laboratory & Radiologic Studies: Pelvic ultrasound on 04/30/22: IMPRESSION: 3.1 cm diameter potentially submucosal leiomyoma LEFT uterus.   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  Assessment & Plan: Andrea Williamson is a 71 y.o. woman with  complex adnexal mass in the setting of multiple comorbidities.  Plan for robotic BSO, possible staging pending frozen section.  Has been cleared by hematology and neurology - both anticoagulation and anti-platelet therapy has been held for surgery. Recommendation from neurologist given proximity of stroke history is to keep surgery as brief as possible.  Jeral Pinch, MD  Division of Gynecologic Oncology  Department of Obstetrics and Gynecology  [facility]

## 2022-06-10 ENCOUNTER — Encounter (HOSPITAL_COMMUNITY): Payer: Self-pay | Admitting: Gynecologic Oncology

## 2022-06-10 ENCOUNTER — Telehealth: Payer: Self-pay

## 2022-06-10 NOTE — Discharge Summary (Signed)
Physician Discharge Summary  Patient ID: Andrea Williamson MRN: [id number] DOB/AGE: 1951-07-01 71 y.o.  Admit date: 06/09/2022 Discharge date: 06/10/2022  Admission Diagnoses: Adnexal mass  Discharge Diagnoses:  Principal Problem:   Adnexal mass   Discharged Condition:  The patient is in good condition and stable for discharge.    Hospital Course: On 06/09/2022, the patient underwent the following: Procedure(s): XI ROBOTIC ASSISTED BILATERAL SALPINGO OOPHORECTOMY, STAGING INCLUDING TOTAL HYSTERECTOMY, OMENTECTOMY PERITONEAL BIOPSIES. 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 The postoperative course was uneventful and she was discharged to home the same day of surgery.  Consults: None  Significant Diagnostic Studies: None  Treatments: Surgery: see above  Discharge Exam: Blood pressure (!) 151/74, pulse (!) 59, temperature (!) 97 F (36.1 C), resp. rate 14, height '5\' 2"'$  (1.575 m), weight 160 lb 11.5 oz (72.9 kg), SpO2 96 %. Performed at last in person appt  Disposition: Discharge disposition: 01-Home or Self Care       Discharge Instructions     Call MD for:  difficulty breathing, headache or visual disturbances   Complete by: As directed    Call MD for:  extreme fatigue   Complete by: As directed    Call MD for:  hives   Complete by: As directed    Call MD for:  persistant dizziness or light-headedness   Complete by: As directed    Call MD for:  persistant nausea and vomiting   Complete by: As directed    Call MD for:  redness, tenderness, or signs of infection (pain, swelling, redness, odor or green/yellow discharge around incision site)   Complete by: As directed    Call MD for:  severe uncontrolled pain   Complete by: As directed    Call MD for:  temperature >100.4   Complete by: As directed    Diet - low sodium heart healthy   Complete by: As directed    Driving Restrictions   Complete by: As directed    No driving for 1 week(s).  Do not take  narcotics and drive. You need to make sure your reaction time has returned.   Increase activity slowly   Complete by: As directed    Lifting restrictions   Complete by: As directed    No lifting greater than 10 lbs, pushing, pulling, straining for 6 weeks.   Sexual Activity Restrictions   Complete by: As directed    No sexual activity, nothing in the vagina, for 8 weeks.      Allergies as of 06/09/2022   No Known Allergies      Medication List     STOP taking these medications    clopidogrel 75 MG tablet Commonly known as: Plavix   Eliquis 5 MG Tabs tablet Generic drug: apixaban       TAKE these medications    ALPRAZolam 0.25 MG tablet Commonly known as: XANAX TAKE 1 TABLET BY MOUTH AT BEDTIME   amLODipine 5 MG tablet Commonly known as: NORVASC Take 1 tablet (5 mg total) by mouth daily.   citalopram 10 MG tablet Commonly known as: CELEXA Take 10 mg by mouth daily.   ferrous sulfate 325 (65 FE) MG tablet Take 325 mg by mouth daily with breakfast.   folic acid 1 MG tablet Commonly known as: FOLVITE Take 1 tablet (1 mg total) by mouth daily.   hydrALAZINE 25 MG tablet Commonly known as: APRESOLINE Take 1 tablet (25 mg total) by mouth every 8 (eight) hours as  needed (SBP >170 or DBP >110).   losartan 25 MG tablet Commonly known as: COZAAR Take 25 mg by mouth daily.   magnesium gluconate 500 MG tablet Commonly known as: MAGONATE Take 0.5 tablets (250 mg total) by mouth at bedtime.   metoprolol succinate 25 MG 24 hr tablet Commonly known as: Toprol XL Take 1 tablet (25 mg total) by mouth daily.   pantoprazole 40 MG tablet Commonly known as: Protonix Take 1 tablet (40 mg total) by mouth daily.   Repatha SureClick 185 MG/ML Soaj Generic drug: Evolocumab Inject 140 mg into the skin every 14 (fourteen) days.   senna-docusate 8.6-50 MG tablet Commonly known as: Senokot-S Take 2 tablets by mouth at bedtime. For AFTER surgery, do not take if having  diarrhea   traMADol 50 MG tablet Commonly known as: ULTRAM Take 1 tablet (50 mg total) by mouth every 12 (twelve) hours as needed. For AFTER surgery only, do not take and drive        Follow-up Information     [name], MD Follow up on 06/16/2022.   Specialty: Gynecologic Oncology Why: at 4:20pm will be a PHONE visit with Dr. Berline Lopes to discuss results and check in. IN PERSON visit will be on 07/02/22 at 1:45pm at the [facility]. Contact information: [address] [place] [address] [phone]                 Greater than thirty minutes were spend for face to face discharge instructions and discharge orders/summary in EPIC.   Signed: Dorothyann Gibbs 06/10/2022, 8:24 AM

## 2022-06-10 NOTE — Telephone Encounter (Signed)
Spoke with Andrea Williamson this afternoon. She states she is eating, drinking and urinating well. She has some burning with urination. Instructed on use of peri-bottle and advised to monitor. She has not had a BM yet and is not passing gas. She is taking senokot as prescribed and encouraged her to drink plenty of water. She denies fever or chills. Incisions are dry and intact. She rates her pain 3-4/10. Her pain is controlled with tylenol.  Per provider advised patient to resume her Eliquis 3 days after surgery and her plavix one week after surgery.    Instructed to call office with any fever, chills, purulent drainage, uncontrolled pain or any other questions or concerns. Patient verbalizes understanding.   Pt aware of post op appointments as well as the office number [phone] and after hours number [phone] to call if she has any questions or concerns

## 2022-06-11 LAB — SURGICAL PATHOLOGY

## 2022-06-11 LAB — CYTOLOGY - NON PAP

## 2022-06-13 LAB — TYPE AND SCREEN
ABO/RH(D): O POS
Antibody Screen: NEGATIVE
Donor AG Type: NEGATIVE
Donor AG Type: NEGATIVE
Unit division: 0
Unit division: 0

## 2022-06-13 LAB — BPAM RBC
Blood Product Expiration Date: 202308242359
Blood Product Expiration Date: 202308242359
Unit Type and Rh: 9500
Unit Type and Rh: 9500

## 2022-06-14 ENCOUNTER — Encounter: Payer: Self-pay | Admitting: Cardiology

## 2022-06-14 ENCOUNTER — Ambulatory Visit: Payer: Medicare Other | Admitting: Cardiology

## 2022-06-14 VITALS — BP 150/86 | HR 67 | Ht 62.0 in | Wt 158.4 lb

## 2022-06-14 DIAGNOSIS — E7800 Pure hypercholesterolemia, unspecified: Secondary | ICD-10-CM | POA: Diagnosis not present

## 2022-06-14 DIAGNOSIS — I10 Essential (primary) hypertension: Secondary | ICD-10-CM

## 2022-06-14 MED ORDER — LOSARTAN POTASSIUM 50 MG PO TABS: 50.0000 mg | ORAL_TABLET | Freq: Every day | ORAL | 1 refills | Status: DC

## 2022-06-14 NOTE — Progress Notes (Signed)
Cardiology Office Note:    Date:  06/14/2022   ID:  [name], DOB [birth year], MRN [id number]  PCP:  Derinda Late, MD   [facility] Cardiologist:  Kate Sable, MD     Referring MD: [name], MD   Chief Complaint  Patient presents with   Follow-up    4-5 month follow up. Patient states that she feels fine. Meds reviewed with patient.     History of Present Illness:    Andrea Williamson is a 71 y.o. 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    Being seen for hyperlipidemia, hypertension.  Started on Benson last month by her neurologist for better cholesterol control.  Was previously on Lipitor 80 mg daily, denies any adverse effects with taking Lipitor.  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  Blood pressures at home are still elevated, systolics in the 119E to 174 range.  Takes losartan 25, amlodipine 5 mg.  Endorses having anxiety, being controlled by PCP.   Prior notes Echo 12/2021 EF 65 to 70% Echocardiogram 09/2021 EF 65-70, impaired relaxation.  Moderate LA dilatation. 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  She had a stroke in November 2022, started on aspirin and Brilinta.  Subsequently diagnosed with bilateral BK DVT, Eliquis was started.  Being followed by hematology and neurology.  Stopped smoking after stroke.  Past Medical History:  Diagnosis Date   Atherosclerosis    Deep vein thrombosis (DVT) ([place])    Hemolytic anemia ([place])    Hypertension    Stroke Andrea Williamson)     Past Surgical History:  Procedure Laterality Date   ANKLE SURGERY Left 2008   IR CT HEAD LTD  10/05/2021   IR INTRA CRAN STENT  10/01/2021   IR PERCUTANEOUS ART THROMBECTOMY/INFUSION INTRACRANIAL INC DIAG ANGIO  10/01/2021   IR US GUIDE VASC ACCESS RIGHT   10/01/2021   IVC FILTER INSERTION N/A 01/06/2022   Procedure: IVC FILTER INSERTION;  Surgeon: Algernon Huxley, MD;  Location: [facility];  Service: Cardiovascular;  Laterality: N/A;   RADIOLOGY WITH ANESTHESIA N/A 10/01/2021   Procedure: IR WITH ANESTHESIA;  Surgeon: Radiologist, Medication, MD;  Location: [facility];  Service: Radiology;  Laterality: N/A;   ROBOTIC ASSISTED BILATERAL SALPINGO OOPHERECTOMY Bilateral 06/09/2022   Procedure: XI ROBOTIC ASSISTED BILATERAL SALPINGO OOPHORECTOMY, STAGING INCLUDING TOTAL HYSTERECTOMY, OMENTECTOMY PERITONEAL BIOPSIES;  Surgeon: [name], MD;  Location: [facility];  Service: Gynecology;  Laterality: Bilateral;    Current Medications: Current Meds  Medication Sig   ALPRAZolam (XANAX) 0.25 MG tablet TAKE 1 TABLET BY MOUTH AT BEDTIME   amLODipine (NORVASC) 5 MG tablet Take 1 tablet (5 mg total) by mouth daily.   citalopram (CELEXA) 10 MG tablet Take 10 mg by mouth daily.   ferrous sulfate 325 (65 FE) MG tablet Take 325 mg by mouth daily with breakfast.   folic acid (FOLVITE) 1 MG tablet Take 1 tablet (1 mg total) by mouth daily.   hydrALAZINE (APRESOLINE) 25 MG tablet Take 1 tablet (25 mg total) by mouth every 8 (eight) hours as needed (SBP >170 or DBP >110).   losartan (COZAAR) 50 MG tablet Take 1 tablet (50 mg total) by mouth daily.   magnesium gluconate (MAGONATE) 500 MG tablet Take 0.5 tablets (250 mg total) by mouth at  bedtime.   metoprolol succinate (TOPROL XL) 25 MG 24 hr tablet Take 1 tablet (25 mg total) by mouth daily.   pantoprazole (PROTONIX) 40 MG tablet Take 1 tablet (40 mg total) by mouth daily.   REPATHA SURECLICK 623 MG/ML SOAJ Inject 140 mg into the skin every 14 (fourteen) days.   [DISCONTINUED] losartan (COZAAR) 25 MG tablet Take 25 mg by mouth daily.     Allergies:   Patient has no known allergies.   Social History   Socioeconomic History   Marital status: Married    Spouse name: Not on file   Number of children: 3    Years of education: 12   Highest education level: 12th grade  Occupational History   Occupation: Retired  Tobacco Use   Smoking status: Former    Packs/day: 1.50    Years: 50.00    Total pack years: 75.00    Types: Cigarettes    Quit date: 09/2021    Years since quitting: 0.7   Smokeless tobacco: Never  Vaping Use   Vaping Use: Never used  Substance and Sexual Activity   Alcohol use: Not Currently   Drug use: Not Currently   Sexual activity: Not Currently  Other Topics Concern   Not on file  Social History Narrative   October 2022 stopped   Social Determinants of Health   Financial Resource Strain: Low Risk  (11/26/2021)   Overall Financial Resource Strain (CARDIA)    Difficulty of Paying Living Expenses: Not hard at all  Food Insecurity: No Food Insecurity (11/26/2021)   Hunger Vital Sign    Worried About Running Out of Food in the Last Year: Never true    [place] in the Last Year: Never true  Transportation Needs: No Transportation Needs (11/26/2021)   PRAPARE - Hydrologist (Medical): No    Lack of Transportation (Non-Medical): No  Physical Activity: Insufficiently Active (11/26/2021)   Exercise Vital Sign    Days of Exercise per Week: 2 days    Minutes of Exercise per Session: 60 min  Stress: Stress Concern Present (11/26/2021)   [place]    Feeling of Stress : To some extent  Social Connections: Moderately Isolated (11/26/2021)   Social Connection and Isolation Panel [NHANES]    Frequency of Communication with Friends and Family: More than three times a week    Frequency of Social Gatherings with Friends and Family: More than three times a week    Attends Religious Services: Never    Marine scientist or Organizations: No    Attends Music therapist: Never    Marital Status: Married     Family History: The patient's family history includes  COPD in her mother; Cancer in her mother. There is no history of Colon cancer, Breast cancer, Ovarian cancer, Endometrial cancer, Prostate cancer, or Pancreatic cancer.  ROS:   Please see the history of present illness.     All other systems reviewed and are negative.  EKGs/Labs/Other Studies Reviewed:    The following studies were reviewed today:   EKG:  EKG not ordered today.    Recent Labs: 01/06/2022: TSH 1.613 01/13/2022: Magnesium 1.9 06/03/2022: ALT 18; BUN 28; Creatinine, Ser 1.21; Hemoglobin 11.4; Platelets 276; Potassium 4.1; Sodium 144  Recent Lipid Panel    Component Value Date/Time   CHOL 209 (H) 02/04/2022 1035   TRIG 197 (H) 02/04/2022 1035   HDL  52 02/04/2022 1035   CHOLHDL 4.0 02/04/2022 1035   VLDL 39 02/04/2022 1035   LDLCALC 118 (H) 02/04/2022 1035     Risk Assessment/Calculations:          Physical Exam:    VS:  BP (!) 150/86 (BP Location: Left Arm, Patient Position: Sitting, Cuff Size: Normal)   Pulse 67   Ht '5\' 2"'$  (1.575 m)   Wt 158 lb 6.4 oz (71.8 kg)   SpO2 97%   BMI 28.97 kg/m     Wt Readings from Last 3 Encounters:  06/14/22 158 lb 6.4 oz (71.8 kg)  06/09/22 160 lb 11.5 oz (72.9 kg)  06/04/22 160 lb 11.2 oz (72.9 kg)     GEN:  Well nourished, well developed in no acute distress HEENT: Normal NECK: No JVD; No carotid bruits CARDIAC: RRR, no murmurs, rubs, gallops RESPIRATORY:  Clear to auscultation without rales, wheezing or rhonchi  ABDOMEN: Soft, non-tender, non-distended MUSCULOSKELETAL:  No edema; No deformity  SKIN: Warm and dry NEUROLOGIC:  Alert and oriented x 3 PSYCHIATRIC:  Normal affect   ASSESSMENT:    1. Primary hypertension   2. Pure hypercholesterolemia    PLAN:    In order of problems listed above:  Hypertension, BP elevated, increase losartan to 50 mg daily, continue amlodipine 5 mg daily.  Component of anxiety might be contributing to elevated BP Hyperlipidemia, continue Repatha, check fasting lipid profile.   If cholesterol not controlled, add Lipitor.  Follow-up in 3 months.     Medication Adjustments/Labs and Tests Ordered: Current medicines are reviewed at length with the patient today.  Concerns regarding medicines are outlined above.  Orders Placed This Encounter  Procedures   Lipid Profile   Meds ordered this encounter  Medications   losartan (COZAAR) 50 MG tablet    Sig: Take 1 tablet (50 mg total) by mouth daily.    Dispense:  90 tablet    Refill:  1    Dose increase    Patient Instructions  Medication Instructions:  - Your physician has recommended you make the following change in your medication:   1) INCREASE Losartan to 50 mg: - take 1 tablet by mouth once daily   *If you need a refill on your cardiac medications before your next appointment, please call your pharmacy*   Lab Work: - Your physician recommends that you return for FASTING lab work at your convenience:  Lipid profile  Do not eat/ drink anything for 8 hours prior to your lab draw except for water or black coffee.  [facility] Entrance at [facility] 1st desk on the right to check in (REGISTRATION)  Lab hours: Monday- Friday (7:30 am- 5:30 pm)   If you have labs (blood work) drawn today and your tests are completely normal, you will receive your results only by: [place] (if you have MyChart) OR A paper copy in the mail If you have any lab test that is abnormal or we need to change your treatment, we will call you to review the results.   Testing/Procedures: - none ordered   Follow-Up: At [facility], you and your health needs are our priority.  As part of our continuing mission to provide you with exceptional heart care, we have created designated Provider Care Teams.  These Care Teams include your primary Cardiologist (physician) and Advanced Practice Providers (APPs -  Physician Assistants and Nurse Practitioners) who all work together to provide you with the care you need, when you need  it.  We recommend signing up for the patient portal called "MyChart".  Sign up information is provided on this After Visit Summary.  MyChart is used to connect with patients for Virtual Visits (Telemedicine).  Patients are able to view lab/test results, encounter notes, upcoming appointments, etc.  Non-urgent messages can be sent to your provider as well.   To learn more about what you can do with MyChart, go to [email or web address].    Your next appointment:   3 month(s)  The format for your next appointment:   In Person  Provider:   You may see Kate Sable, MD or one of the following Advanced Practice Providers on your designated Care Team:   Murray Hodgkins, NP Christell Faith, PA-C Cadence Kathlen Mody, [place]    Other Instructions N/a  Important Information About Sugar         Signed, Kate Sable, MD  06/14/2022 12:17 PM    [place]

## 2022-06-14 NOTE — Patient Instructions (Signed)
Medication Instructions:  - Your physician has recommended you make the following change in your medication:   1) INCREASE Losartan to 50 mg: - take 1 tablet by mouth once daily   *If you need a refill on your cardiac medications before your next appointment, please call your pharmacy*   Lab Work: - Your physician recommends that you return for FASTING lab work at your convenience:  Lipid profile  Do not eat/ drink anything for 8 hours prior to your lab draw except for water or black coffee.  Medical Mall Entrance at [facility] 1st desk on the right to check in (REGISTRATION)  Lab hours: Monday- Friday (7:30 am- 5:30 pm)   If you have labs (blood work) drawn today and your tests are completely normal, you will receive your results only by: [place] (if you have MyChart) OR A paper copy in the mail If you have any lab test that is abnormal or we need to change your treatment, we will call you to review the results.   Testing/Procedures: - none ordered   Follow-Up: At [facility], you and your health needs are our priority.  As part of our continuing mission to provide you with exceptional heart care, we have created designated Provider Care Teams.  These Care Teams include your primary Cardiologist (physician) and Advanced Practice Providers (APPs -  Physician Assistants and Nurse Practitioners) who all work together to provide you with the care you need, when you need it.  We recommend signing up for the patient portal called "MyChart".  Sign up information is provided on this After Visit Summary.  MyChart is used to connect with patients for Virtual Visits (Telemedicine).  Patients are able to view lab/test results, encounter notes, upcoming appointments, etc.  Non-urgent messages can be sent to your provider as well.   To learn more about what you can do with MyChart, go to [email or web address].    Your next appointment:   3 month(s)  The format for your next  appointment:   In Person  Provider:   You may see Kate Sable, MD or one of the following Advanced Practice Providers on your designated Care Team:   Murray Hodgkins, NP Christell Faith, PA-C Cadence Kathlen Mody, [place]    Other Instructions N/a  Important Information About Sugar

## 2022-06-15 ENCOUNTER — Other Ambulatory Visit
Admission: RE | Admit: 2022-06-15 | Discharge: 2022-06-15 | Disposition: A | Discharge status: Home / Self Care | Payer: Medicare Other | Attending: Cardiology | Admitting: Cardiology

## 2022-06-15 DIAGNOSIS — E7800 Pure hypercholesterolemia, unspecified: Secondary | ICD-10-CM | POA: Insufficient documentation

## 2022-06-15 DIAGNOSIS — I2782 Chronic pulmonary embolism: Secondary | ICD-10-CM

## 2022-06-15 LAB — LIPID PANEL
Cholesterol: 182 mg/dL (ref 0–200)
HDL: 65 mg/dL (ref >40)
LDL Cholesterol: 77 mg/dL (ref 0–99)
Total CHOL/HDL Ratio: 2.8 RATIO
Triglycerides: 198 mg/dL — ABNORMAL HIGH (ref <150)
VLDL: 40 mg/dL (ref 0–40)

## 2022-06-16 ENCOUNTER — Telehealth: Payer: Self-pay

## 2022-06-16 ENCOUNTER — Inpatient Hospital Stay: Payer: Medicare Other | Attending: Oncology | Admitting: Gynecologic Oncology

## 2022-06-16 ENCOUNTER — Encounter: Payer: Self-pay | Admitting: Gynecologic Oncology

## 2022-06-16 DIAGNOSIS — N9489 Other specified conditions associated with female genital organs and menstrual cycle: Secondary | ICD-10-CM

## 2022-06-16 DIAGNOSIS — Z7189 Other specified counseling: Secondary | ICD-10-CM

## 2022-06-16 DIAGNOSIS — Z9079 Acquired absence of other genital organ(s): Secondary | ICD-10-CM

## 2022-06-16 DIAGNOSIS — Z90710 Acquired absence of both cervix and uterus: Secondary | ICD-10-CM

## 2022-06-16 DIAGNOSIS — D3911 Neoplasm of uncertain behavior of right ovary: Secondary | ICD-10-CM | POA: Insufficient documentation

## 2022-06-16 DIAGNOSIS — Z90722 Acquired absence of ovaries, bilateral: Secondary | ICD-10-CM

## 2022-06-16 NOTE — Progress Notes (Signed)
Gynecologic Oncology Telehealth Note: Gyn-Onc  I connected with Andrea Williamson on 06/16/22 at  4:20 PM EDT by telephone and verified that I am speaking with the correct person using two identifiers.  I discussed the limitations, risks, security and privacy concerns of performing an evaluation and management service by telemedicine and the availability of in-person appointments. I also discussed with the patient that there may be a patient responsible charge related to this service. The patient expressed understanding and agreed to proceed.  Other persons participating in the visit and their role in the encounter: none.  Patient's location: Home Provider's location: [facility]  Reason for Visit: Follow-up after surgery, treatment planning  Treatment History: Andrea Williamson is a 71 year old female with a medical history that includes HTN, CVA s/p left A2 thrombectomy with stent placement 10/01/2021 who presented to the [facility] ER with symptoms of nausea, weakness, dizziness for the past week. Ct imaging of the head was negative for acute intracranial abnormality with small chronic appearing left ACA infarcts. 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  CT AP with contrast on 11/13/2021 revealed a solid and cystic right adnexal mass measuring 4.9 cms, non specific stranding in the RUQ of the abd with associated sub-centimeter lymph nodes. 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   To further evaluate the ovarian mass, she underwent a pelvic/transvaginal ultrasound with the impression showing a complex lesion in the right adnexa with mostly cystic echogenicity along with a lobulated solid nodule measuring 1.4 cm in size with demonstrable internal vascularity. Possibility of malignant neoplastic process in the right ovary is not excluded. There is inhomogeneous echogenicity in  myometrium with 4 cm fibroid. Left ovary is not sonographically visualized.   Ca1 25 was normal at 21.5.   Pelvic MRI was performed on 1/4 showing a complex solid and cystic 2.9 x 3 cm right ovarian mass with T2 hyperintensity and T1 hyperintensity.  Anteriorly there is a 1 cm enhancing soft tissue component which is corresponds to the ultrasound area of vascularity.   Pelvic MRI was performed on 02/17/2022 which showed stable 3.2 cm complex cystic mass involving the right ovary with 1 cm solid mural nodule, highly suspicious for cystic ovarian neoplasm.  06/09/2022: Total robotic hysterectomy with BSO, staging including omentectomy and peritoneal biopsies.  Frozen section consistent with at least borderline tumor.  Interval History: Recovering well. Denies pain. Bowels moving normally.  Denies urinary symptoms. Denies vaginal bleeding. Tolerating PO without issues.   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[place])    Hypertension    Stroke [facility])     Past Surgical History:  Procedure Laterality Date   ANKLE SURGERY Left 2008   IR CT HEAD LTD  10/05/2021   IR INTRA CRAN STENT  10/01/2021   IR PERCUTANEOUS ART THROMBECTOMY/INFUSION INTRACRANIAL INC DIAG ANGIO  10/01/2021   IR US GUIDE VASC ACCESS RIGHT  10/01/2021   IVC FILTER INSERTION N/A 01/06/2022   Procedure: IVC FILTER INSERTION;  Surgeon: Algernon Huxley, MD;  Location: [place] CV [facility];  Service: Cardiovascular;  Laterality: N/A;   RADIOLOGY WITH ANESTHESIA N/A 10/01/2021   Procedure: IR WITH ANESTHESIA;  Surgeon: Radiologist, Medication, MD;  Location: [place];  Service: Radiology;  Laterality: N/A;  ROBOTIC ASSISTED BILATERAL SALPINGO OOPHERECTOMY Bilateral 06/09/2022   Procedure: XI ROBOTIC ASSISTED BILATERAL SALPINGO OOPHORECTOMY, STAGING INCLUDING TOTAL HYSTERECTOMY, OMENTECTOMY PERITONEAL BIOPSIES;  Surgeon: [name], MD;  Location: [facility];  Service: Gynecology;  Laterality: Bilateral;    Family History  Problem Relation Age of Onset   COPD Mother    Cancer Mother    Colon cancer Neg Hx    Breast cancer Neg Hx    Ovarian cancer Neg Hx    Endometrial cancer Neg Hx    Prostate cancer Neg Hx    Pancreatic cancer Neg Hx     Social History   Socioeconomic History   Marital status: Married    Spouse name: Not on file   Number of children: 3   Years of education: 12   Highest education level: 12th grade  Occupational History   Occupation: Retired  Tobacco Use   Smoking status: Former    Packs/day: 1.50    Years: 50.00    Total pack years: 75.00    Types: Cigarettes    Quit date: 09/2021    Years since quitting: 0.7   Smokeless tobacco: Never  Vaping Use   Vaping Use: Never used  Substance and Sexual Activity   Alcohol use: Not Currently   Drug use: Not Currently   Sexual activity: Not Currently  Other Topics Concern   Not on file  Social History Narrative   October 2022 stopped   Social Determinants of Health   Financial Resource Strain: Low Risk  (11/26/2021)   Overall Financial Resource Strain (CARDIA)    Difficulty of Paying Living Expenses: Not hard at all  Food Insecurity: No Food Insecurity (11/26/2021)   Hunger Vital Sign    Worried About Running Out of Food in the Last Year: Never true    [place] in the Last Year: Never true  Transportation Needs: No Transportation Needs (11/26/2021)   PRAPARE - Hydrologist (Medical): No    Lack of Transportation (Non-Medical): No  Physical Activity: Insufficiently Active (11/26/2021)   Exercise Vital Sign    Days of Exercise per Week: 2 days    Minutes of Exercise per Session: 60 min  Stress: Stress Concern Present (11/26/2021)   [place]    Feeling of Stress : To some extent  Social Connections: Moderately Isolated  (11/26/2021)   Social Connection and Isolation Panel [NHANES]    Frequency of Communication with Friends and Family: More than three times a week    Frequency of Social Gatherings with Friends and Family: More than three times a week    Attends Religious Services: Never    Marine scientist or Organizations: No    Attends Archivist Meetings: Never    Marital Status: Married    Current Medications:  Current Outpatient Medications:    ALPRAZolam (XANAX) 0.25 MG tablet, TAKE 1 TABLET BY MOUTH AT BEDTIME, Disp: 30 tablet, Rfl: 0   amLODipine (NORVASC) 5 MG tablet, Take 1 tablet (5 mg total) by mouth daily., Disp: 90 tablet, Rfl: 1   citalopram (CELEXA) 10 MG tablet, Take 10 mg by mouth daily., Disp: , Rfl:    ferrous sulfate 325 (65 FE) MG tablet, Take 325 mg by mouth daily with breakfast., Disp: , Rfl:    folic acid (FOLVITE) 1 MG tablet, Take 1 tablet (1 mg total) by mouth daily., Disp: , Rfl:  hydrALAZINE (APRESOLINE) 25 MG tablet, Take 1 tablet (25 mg total) by mouth every 8 (eight) hours as needed (SBP >170 or DBP >110)., Disp: 90 tablet, Rfl: 3   losartan (COZAAR) 50 MG tablet, Take 1 tablet (50 mg total) by mouth daily., Disp: 90 tablet, Rfl: 1   magnesium gluconate (MAGONATE) 500 MG tablet, Take 0.5 tablets (250 mg total) by mouth at bedtime., Disp: 30 tablet, Rfl: 0   metoprolol succinate (TOPROL XL) 25 MG 24 hr tablet, Take 1 tablet (25 mg total) by mouth daily., Disp: 30 tablet, Rfl: 11   pantoprazole (PROTONIX) 40 MG tablet, Take 1 tablet (40 mg total) by mouth daily., Disp: 90 tablet, Rfl: 1   REPATHA SURECLICK 938 MG/ML SOAJ, Inject 140 mg into the skin every 14 (fourteen) days., Disp: , Rfl:    senna-docusate (SENOKOT-S) 8.6-50 MG tablet, Take 2 tablets by mouth at bedtime. For AFTER surgery, do not take if having diarrhea (Patient not taking: Reported on 06/14/2022), Disp: 30 tablet, Rfl: 0   traMADol (ULTRAM) 50 MG tablet, Take 1 tablet (50 mg total) by mouth  every 12 (twelve) hours as needed. For AFTER surgery only, do not take and drive (Patient not taking: Reported on 06/14/2022), Disp: 10 tablet, Rfl: 0  Review of Symptoms: Pertinent positives as per HPI.  Physical Exam: There were no vitals taken for this visit. Deferred given limitations of phone visit.  Laboratory & Radiologic Studies: A. OVARY AND FALLOPIAN TUBE, RIGHT, SALPINGO OOPHORECTOMY:  Serous borderline tumor of the ovary, mixed conventional and  micropapillary.  Normal and accompanying fallopian tube without any neoplastic  involvement.  Please see the synoptic report after specimen K   B. ADHESION, RIGHT PELVIC SIDWALL, EXCISION:  Portion of peritoneum with dystrophic microcalcification and clusters of  pigment-laden macrophages.  Negative for neoplasm.   C. UTERINE SEROSA, ANTERIOR, EXCISION:  Portion of myometrium and adjacent serosa with minute foci of dystrophic  microcalcification.  Negative for neoplasm.   D. UTERUS, CERVIX, LEFT FALLOPIAN TUBE AND OVARY, RESECTION:  (Weight: 66 grams.)  Small uterine leiomyoma.  Negative for malignancy in the myoma.  Hypertrophic endometrium.  Negative for endometrial hyperplasia and malignancy.  Uterine cervix with cystic endocervical glands and atrophic ectocervical  epithelium.  Negative for SIL and glandular neoplasia in the uterine cervix.  Left ovary with endosalpingosis.  Normal left fallopian tube.  Negative for endometriosis and neoplasm in the ovary and fallopian tube.   E. OMENTUM, RESECTION:  Portion of omentum with vascular ectasia.  Negative for neoplasm.   F. PELVIC SIDEWALL, RIGHT, BIOPSY:  Normal fibroconnective tissue.  Negative for neoplasm.   G. PARA COLIC GUTTER, RIGHT, BIOPSY:  Normal fibroadipose tissue with mesothelial lining.  Negative for neoplasm.   H. PARA COLIC GUTTER, LEFT, BIOPSY:  Normal fibroadipose tissue with a lining of mesothelium.  Negative for neoplasm.   I. PELVIC  SIDEWALL, LEFT, BIOPSY:  Normal fibroadipose tissue with a lining of mesothelium.  Negative for neoplasm.   J. CUL DE SAC, POSTERIOR, BIOPSY:  Normal fibroconnective tissue.  Negative for neoplasm.   K. CUL DE SAC, ANTERIOR, BIOPSY:  Portion of fibroconnective tissue with a focus of dystrophic  microcalcification and vascular ectasia.  Negative for neoplasm.   OVARY or FALLOPIAN TUBE or PRIMARY PERITONEUM: Resection   Procedure: Total hysterectomy with bilateral salpingo-oophorectomy.  Specimen Integrity: Intact.  Tumor Site: Right ovary  Tumor Size: 4.3 cm. X 4.1 cm. X 1.9 cm.  Histologic Type: Serous borderline tumor, mixed conventional and  micropapillary.  Histologic Grade: GB (borderline tumor).  Ovarian Surface Involvement: Present.  Fallopian Tube Surface Involvement: Not identified.  Implants (required for advanced stage serous/seromucinous borderline  tumors only): Not applicable.  Other Tissue/ Organ Involvement: No.  Largest Extrapelvic Peritoneal Focus: Not applicable.  Peritoneal/Ascitic Fluid Involvement: Not applicable.  Chemotherapy Response Score (CRS): Not applicable, no known presurgical  therapy.  Regional Lymph Nodes: Not applicable (no lymph nodes submitted or found)      Distant Metastasis:      Distant Site(s) Involved: None known.  Pathologic Stage Classification (pTNM, AJCC 8th Edition): pT1c, pN (not  assigned).  Ancillary Studies: Can be performed upon request  Representative Tumor Block: A2  Comment(s): None.   Cytology FINAL MICROSCOPIC DIAGNOSIS:  - Suspicious for malignancy  Papillary clusters of atypical mesothelial cells are present..  (In view of the serous borderline tumor in the ovary, the cells most  likely represent neoplastic cells from the surface of the ovary).   Assessment & Plan: VERONICA FRETZ is a 71 y.o. 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  The patient is overall  doing well and meeting postoperative milestones.  Discussed continued expectations and postoperative restrictions.  Reviewed pathology results with her.  She is very pleased with this news.  Discussed plan will be for closer surveillance but no additional treatment is indicated.  I discussed the assessment and treatment plan with the patient. The patient was provided with an opportunity to ask questions and all were answered. The patient agreed with the plan and demonstrated an understanding of the instructions.   The patient was advised to call back or see an in-person evaluation if the symptoms worsen or if the condition fails to improve as anticipated.   8 minutes of total time was spent for this patient encounter, including preparation, Williamson-to-Williamson counseling with the patient and coordination of care, and documentation of the encounter.   Jeral Pinch, MD  Division of Gynecologic Oncology  Department of Obstetrics and Gynecology  [facility]

## 2022-06-16 NOTE — Telephone Encounter (Signed)
Spoke with Andrea Williamson this afternoon. She reports she is doing well after her surgery and is not experiencing any pain, vaginal discharge or vaginal bleeding. Patient is to resume her Plavix today (one week after surgery). Advised to notify the office with signs of bleeding. Patient verbalized understanding.

## 2022-06-28 ENCOUNTER — Other Ambulatory Visit: Payer: Self-pay | Admitting: Internal Medicine

## 2022-06-29 ENCOUNTER — Encounter: Payer: Self-pay | Admitting: Gynecologic Oncology

## 2022-07-02 ENCOUNTER — Other Ambulatory Visit: Payer: Self-pay

## 2022-07-02 ENCOUNTER — Inpatient Hospital Stay (HOSPITAL_BASED_OUTPATIENT_CLINIC_OR_DEPARTMENT_OTHER): Payer: Medicare Other | Admitting: Gynecologic Oncology

## 2022-07-02 ENCOUNTER — Encounter: Payer: Self-pay | Admitting: Gynecologic Oncology

## 2022-07-02 VITALS — BP 148/81 | HR 59 | Temp 98.5°F | Resp 18 | Ht 62.0 in | Wt 159.0 lb

## 2022-07-02 DIAGNOSIS — Z90710 Acquired absence of both cervix and uterus: Secondary | ICD-10-CM

## 2022-07-02 DIAGNOSIS — Z7189 Other specified counseling: Secondary | ICD-10-CM

## 2022-07-02 DIAGNOSIS — Z90722 Acquired absence of ovaries, bilateral: Secondary | ICD-10-CM

## 2022-07-02 DIAGNOSIS — D3911 Neoplasm of uncertain behavior of right ovary: Secondary | ICD-10-CM

## 2022-07-02 NOTE — Patient Instructions (Signed)
It was good to see you today.  You are healing well from surgery.  Remember, no heavy lifting for at least 6 weeks after surgery and nothing in the vagina for at least 8-10.  We will plan on visits initially every 6 months.  If you develop any concerning symptoms between visits, please call to see me sooner.  Otherwise, please call after the new year to schedule a visit to see me in late February.

## 2022-07-02 NOTE — Progress Notes (Signed)
Gynecologic Oncology Return Clinic Visit  07/02/2022  Reason for Visit: Follow-up after surgery, treatment planning  Treatment History: Andrea Williamson is a 70 year old female with a medical history that includes HTN, CVA s/p left A2 thrombectomy with stent placement 10/01/2021 who presented to the [facility] ER with symptoms of nausea, weakness, dizziness for the past week. Ct imaging of the head was negative for acute intracranial abnormality with small chronic appearing left ACA infarcts. 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  CT AP with contrast on 11/13/2021 revealed a solid and cystic right adnexal mass measuring 4.9 cms, non specific stranding in the RUQ of the abd with associated sub-centimeter lymph nodes. 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   To further evaluate the ovarian mass, she underwent a pelvic/transvaginal ultrasound with the impression showing a complex lesion in the right adnexa with mostly cystic echogenicity along with a lobulated solid nodule measuring 1.4 cm in size with demonstrable internal vascularity. Possibility of malignant neoplastic process in the right ovary is not excluded. 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 Left ovary is not sonographically visualized.   Ca1 25 was normal at 21.5.   Pelvic MRI was performed on 1/4 showing a complex solid and cystic 2.9 x 3 cm right ovarian mass with T2 hyperintensity and T1 hyperintensity.  Anteriorly there is a 1 cm enhancing soft tissue component which is corresponds to the ultrasound area of vascularity.   Pelvic MRI was performed on 02/17/2022 which showed stable 3.2 cm complex cystic mass involving the right ovary with 1 cm solid mural nodule, highly suspicious for cystic ovarian neoplasm.   06/09/2022: Total robotic hysterectomy with BSO, staging including  omentectomy and peritoneal biopsies.  Frozen section consistent with at least borderline tumor.  Pathology revealed a serous borderline tumor, mixed conventional and micropapillary, of the right ovary.  Cytology positive.  Interval History: Doing well.  Denies any abdominal or pelvic pain.  Denies any vaginal bleeding or discharge.  Has been careful about not doing any heavy lifting.  Reports baseline bowel and bladder function.  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[place])    Hypertension    Stroke [facility])     Past Surgical History:  Procedure Laterality Date   ANKLE SURGERY Left 2008   IR CT HEAD LTD  10/05/2021   IR INTRA CRAN STENT  10/01/2021   IR PERCUTANEOUS ART THROMBECTOMY/INFUSION INTRACRANIAL INC DIAG ANGIO  10/01/2021   IR US GUIDE VASC ACCESS RIGHT  10/01/2021   IVC FILTER INSERTION N/A 01/06/2022   Procedure: IVC FILTER INSERTION;  Surgeon: Algernon Huxley, MD;  Location: [place] [facility];  Service: Cardiovascular;  Laterality: N/A;   RADIOLOGY WITH ANESTHESIA N/A 10/01/2021   Procedure: IR WITH ANESTHESIA;  Surgeon: Radiologist, Medication, MD;  Location: [place];  Service: Radiology;  Laterality: N/A;   ROBOTIC ASSISTED BILATERAL SALPINGO OOPHERECTOMY Bilateral 06/09/2022   Procedure: XI ROBOTIC ASSISTED BILATERAL SALPINGO OOPHORECTOMY, STAGING INCLUDING TOTAL HYSTERECTOMY, OMENTECTOMY PERITONEAL BIOPSIES;  Surgeon: [name], MD;  Location: [facility];  Service: Gynecology;  Laterality: Bilateral;    Family History  Problem Relation Age of Onset   COPD Mother    Cancer Mother    Colon cancer Neg Hx  Breast cancer Neg Hx    Ovarian cancer Neg Hx    Endometrial cancer Neg Hx    Prostate cancer Neg Hx    Pancreatic cancer Neg Hx     Social History   Socioeconomic History   Marital status: Married    Spouse name: Not on file   Number of children: 3    Years of education: 12   Highest education level: 12th grade  Occupational History   Occupation: Retired  Tobacco Use   Smoking status: Former    Packs/day: 1.50    Years: 50.00    Total pack years: 75.00    Types: Cigarettes    Quit date: 09/2021    Years since quitting: 0.7   Smokeless tobacco: Never  Vaping Use   Vaping Use: Never used  Substance and Sexual Activity   Alcohol use: Not Currently   Drug use: Not Currently   Sexual activity: Not Currently  Other Topics Concern   Not on file  Social History Narrative   October 2022 stopped   Social Determinants of Health   Financial Resource Strain: Low Risk  (11/26/2021)   Overall Financial Resource Strain (CARDIA)    Difficulty of Paying Living Expenses: Not hard at all  Food Insecurity: No Food Insecurity (11/26/2021)   Hunger Vital Sign    Worried About Running Out of Food in the Last Year: Never true    [place] in the Last Year: Never true  Transportation Needs: No Transportation Needs (11/26/2021)   PRAPARE - Hydrologist (Medical): No    Lack of Transportation (Non-Medical): No  Physical Activity: Insufficiently Active (11/26/2021)   Exercise Vital Sign    Days of Exercise per Week: 2 days    Minutes of Exercise per Session: 60 min  Stress: Stress Concern Present (11/26/2021)   [place]    Feeling of Stress : To some extent  Social Connections: Moderately Isolated (11/26/2021)   Social Connection and Isolation Panel [NHANES]    Frequency of Communication with Friends and Family: More than three times a week    Frequency of Social Gatherings with Friends and Family: More than three times a week    Attends Religious Services: Never    Marine scientist or Organizations: No    Attends Archivist Meetings: Never    Marital Status: Married    Current Medications:  Current Outpatient Medications:     ALPRAZolam (XANAX) 0.25 MG tablet, TAKE 1 TABLET BY MOUTH AT BEDTIME, Disp: 30 tablet, Rfl: 0   amLODipine (NORVASC) 5 MG tablet, Take 1 tablet (5 mg total) by mouth daily., Disp: 90 tablet, Rfl: 1   citalopram (CELEXA) 10 MG tablet, Take 10 mg by mouth daily., Disp: , Rfl:    clopidogrel (PLAVIX) 75 MG tablet, Take 75 mg by mouth daily., Disp: , Rfl:    ELIQUIS 5 MG TABS tablet, Take 5 mg by mouth 2 (two) times daily., Disp: , Rfl:    ferrous sulfate 325 (65 FE) MG tablet, Take 325 mg by mouth daily with breakfast., Disp: , Rfl:    folic acid (FOLVITE) 1 MG tablet, Take 1 tablet (1 mg total) by mouth daily., Disp: , Rfl:    losartan (COZAAR) 50 MG tablet, Take 1 tablet (50 mg total) by mouth daily., Disp: 90 tablet, Rfl: 1   magnesium gluconate (MAGONATE) 500 MG tablet, Take 0.5 tablets (250 mg  total) by mouth at bedtime., Disp: 30 tablet, Rfl: 0   metoprolol succinate (TOPROL XL) 25 MG 24 hr tablet, Take 1 tablet (25 mg total) by mouth daily., Disp: 30 tablet, Rfl: 11   pantoprazole (PROTONIX) 40 MG tablet, Take 1 tablet (40 mg total) by mouth daily., Disp: 90 tablet, Rfl: 1   REPATHA SURECLICK 700 MG/ML SOAJ, Inject 140 mg into the skin every 14 (fourteen) days., Disp: , Rfl:   Review of Systems: Denies appetite changes, fevers, chills, fatigue, unexplained weight changes. Denies hearing loss, neck lumps or masses, mouth sores, ringing in ears or voice changes. Denies cough or wheezing.  Denies shortness of breath. Denies chest pain or palpitations. Denies leg swelling. Denies abdominal distention, pain, blood in stools, constipation, diarrhea, nausea, vomiting, or early satiety. Denies pain with intercourse, dysuria, frequency, hematuria or incontinence. Denies hot flashes, pelvic pain, vaginal bleeding or vaginal discharge.   Denies joint pain, back pain or muscle pain/cramps. Denies itching, rash, or wounds. Denies dizziness, headaches, numbness or seizures. Denies swollen lymph nodes  or glands, denies easy bruising or bleeding. Denies anxiety, depression, confusion, or decreased concentration.  Physical Exam: BP (!) 148/81 Comment: MD notified, meds taken  Pulse (!) 59   Temp 98.5 F (36.9 C) (Oral)   Resp 18   Ht '5\' 2"'$  (1.575 m)   Wt 159 lb (72.1 kg)   BMI 29.08 kg/m  General: Alert, oriented, no acute distress. HEENT: Normocephalic, atraumatic, sclera anicteric. Chest: Unlabored breathing on room air. Abdomen: soft, nontender.  Normoactive bowel sounds.  No masses or hepatosplenomegaly appreciated.  Well-healed incisions, remaining Dermabond removed.  Mattress sutures excised from left lateral incision. Extremities: Grossly normal range of motion.  Warm, well perfused.  No edema bilaterally. Skin: No rashes or lesions noted. GU: Normal appearing external genitalia without erythema, excoriation, or lesions.  Speculum exam reveals no blood or discharge within the vagina.  Vaginal cuff is intact with suture visible.  Bimanual exam reveals cuff intact, no fluctuance or tenderness with palpation.    Laboratory & Radiologic Studies: None new  Assessment & Plan: Andrea Williamson is a 71 y.o. 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  Status post staging surgery on 06/16/2022.  Patient overall doing well and meeting postoperative milestones.  Discussed continued expectations and restrictions.  Reviewed pathology with her again in detail; she was given a copy of her pathology report.  Final pathology showed borderline tumor of the ovary, cytology positive, all other tissue removed negative for borderline tumor.  Discussed that there are no specific guidelines for surveillance in the setting of borderline tumor.  After completion surgery, risk of recurrence is decreased.  There is a very low risk of recurrence as an invasive carcinoma.  Discussed option of surveillance visits every 3-6 months.  We opted to proceed with surveillance visits  every 6 months initially and will plan to transition to yearly visits in the future.  Reviewed signs and symptoms that should prompt a phone call before her next scheduled visit.  CA-125 was not a tumor marker.  20 minutes of total time was spent for this patient encounter, including preparation, face-to-face counseling with the patient and coordination of care, and documentation of the encounter.  Jeral Pinch, MD  Division of Gynecologic Oncology  Department of Obstetrics and Gynecology  [facility] of [facility]

## 2022-07-14 ENCOUNTER — Other Ambulatory Visit: Payer: Self-pay | Admitting: Internal Medicine

## 2022-07-21 ENCOUNTER — Other Ambulatory Visit: Payer: Self-pay | Admitting: Internal Medicine

## 2022-07-31 ENCOUNTER — Other Ambulatory Visit: Payer: Self-pay | Admitting: Neurology

## 2022-08-30 ENCOUNTER — Inpatient Hospital Stay: Payer: Medicare Other | Admitting: Oncology

## 2022-08-30 ENCOUNTER — Inpatient Hospital Stay: Payer: Medicare Other

## 2022-09-01 ENCOUNTER — Telehealth: Payer: Self-pay

## 2022-09-01 ENCOUNTER — Encounter: Payer: Self-pay | Admitting: Oncology

## 2022-09-01 DIAGNOSIS — D5911 Warm autoimmune hemolytic anemia: Secondary | ICD-10-CM

## 2022-09-01 DIAGNOSIS — I82503 Chronic embolism and thrombosis of unspecified deep veins of lower extremity, bilateral: Secondary | ICD-10-CM

## 2022-09-01 DIAGNOSIS — I2782 Chronic pulmonary embolism: Secondary | ICD-10-CM

## 2022-09-01 NOTE — Telephone Encounter (Signed)
Have her come to see [facility] with labs cbc ferritin and iron studies

## 2022-09-01 NOTE — Telephone Encounter (Signed)
Dr. Janese Banks will like for pt to be seen in [facility].

## 2022-09-02 NOTE — Telephone Encounter (Signed)
Apt made 09/06/22 with Ander Purpura, NP- patient's preference on apt date.

## 2022-09-06 ENCOUNTER — Inpatient Hospital Stay: Payer: Medicare Other | Attending: Oncology

## 2022-09-06 ENCOUNTER — Inpatient Hospital Stay (HOSPITAL_BASED_OUTPATIENT_CLINIC_OR_DEPARTMENT_OTHER): Payer: Medicare Other | Admitting: Nurse Practitioner

## 2022-09-06 VITALS — BP 151/60 | HR 52 | Temp 97.2°F | Ht 62.0 in | Wt 166.3 lb

## 2022-09-06 DIAGNOSIS — Z79899 Other long term (current) drug therapy: Secondary | ICD-10-CM | POA: Diagnosis not present

## 2022-09-06 DIAGNOSIS — Z91148 Patient's other noncompliance with medication regimen for other reason: Secondary | ICD-10-CM | POA: Diagnosis not present

## 2022-09-06 DIAGNOSIS — I2699 Other pulmonary embolism without acute cor pulmonale: Secondary | ICD-10-CM | POA: Diagnosis not present

## 2022-09-06 DIAGNOSIS — R110 Nausea: Secondary | ICD-10-CM | POA: Diagnosis not present

## 2022-09-06 DIAGNOSIS — D5911 Warm autoimmune hemolytic anemia: Secondary | ICD-10-CM | POA: Insufficient documentation

## 2022-09-06 DIAGNOSIS — Z86718 Personal history of other venous thrombosis and embolism: Secondary | ICD-10-CM | POA: Insufficient documentation

## 2022-09-06 DIAGNOSIS — Z825 Family history of asthma and other chronic lower respiratory diseases: Secondary | ICD-10-CM | POA: Diagnosis not present

## 2022-09-06 DIAGNOSIS — R42 Dizziness and giddiness: Secondary | ICD-10-CM | POA: Insufficient documentation

## 2022-09-06 DIAGNOSIS — I2782 Chronic pulmonary embolism: Secondary | ICD-10-CM

## 2022-09-06 DIAGNOSIS — I10 Essential (primary) hypertension: Secondary | ICD-10-CM | POA: Diagnosis not present

## 2022-09-06 DIAGNOSIS — Z8673 Personal history of transient ischemic attack (TIA), and cerebral infarction without residual deficits: Secondary | ICD-10-CM | POA: Diagnosis not present

## 2022-09-06 DIAGNOSIS — Z809 Family history of malignant neoplasm, unspecified: Secondary | ICD-10-CM | POA: Insufficient documentation

## 2022-09-06 DIAGNOSIS — E611 Iron deficiency: Secondary | ICD-10-CM | POA: Diagnosis present

## 2022-09-06 DIAGNOSIS — D5919 Other autoimmune hemolytic anemia: Secondary | ICD-10-CM | POA: Diagnosis not present

## 2022-09-06 DIAGNOSIS — Z90722 Acquired absence of ovaries, bilateral: Secondary | ICD-10-CM | POA: Diagnosis not present

## 2022-09-06 DIAGNOSIS — K921 Melena: Secondary | ICD-10-CM

## 2022-09-06 DIAGNOSIS — Z7901 Long term (current) use of anticoagulants: Secondary | ICD-10-CM | POA: Insufficient documentation

## 2022-09-06 DIAGNOSIS — D508 Other iron deficiency anemias: Secondary | ICD-10-CM

## 2022-09-06 DIAGNOSIS — Z7902 Long term (current) use of antithrombotics/antiplatelets: Secondary | ICD-10-CM | POA: Insufficient documentation

## 2022-09-06 DIAGNOSIS — R1900 Intra-abdominal and pelvic swelling, mass and lump, unspecified site: Secondary | ICD-10-CM | POA: Insufficient documentation

## 2022-09-06 DIAGNOSIS — E785 Hyperlipidemia, unspecified: Secondary | ICD-10-CM | POA: Insufficient documentation

## 2022-09-06 DIAGNOSIS — I82503 Chronic embolism and thrombosis of unspecified deep veins of lower extremity, bilateral: Secondary | ICD-10-CM

## 2022-09-06 LAB — CBC WITH DIFFERENTIAL/PLATELET
Abs Immature Granulocytes: 0.02 10*3/uL (ref 0.00–0.07)
Basophils Absolute: 0.1 10*3/uL (ref 0.0–0.1)
Basophils Relative: 1 %
Eosinophils Absolute: 0.2 10*3/uL (ref 0.0–0.5)
Eosinophils Relative: 5 %
HCT: 30.9 % — ABNORMAL LOW (ref 36.0–46.0)
Hemoglobin: 9.9 g/dL — ABNORMAL LOW (ref 12.0–15.0)
Immature Granulocytes: 0 %
Lymphocytes Relative: 30 %
Lymphs Abs: 1.4 10*3/uL (ref 0.7–4.0)
MCH: 30.7 pg (ref 26.0–34.0)
MCHC: 32 g/dL (ref 30.0–36.0)
MCV: 95.7 fL (ref 80.0–100.0)
Monocytes Absolute: 0.4 10*3/uL (ref 0.1–1.0)
Monocytes Relative: 9 %
Neutro Abs: 2.6 10*3/uL (ref 1.7–7.7)
Neutrophils Relative %: 55 %
Platelets: 268 10*3/uL (ref 150–400)
RBC: 3.23 MIL/uL — ABNORMAL LOW (ref 3.87–5.11)
RDW: 13.8 % (ref 11.5–15.5)
WBC: 4.7 10*3/uL (ref 4.0–10.5)
nRBC: 0 % (ref 0.0–0.2)

## 2022-09-06 LAB — IRON AND TIBC
Iron: 70 ug/dL (ref 28–170)
Saturation Ratios: 20 % (ref 10.4–31.8)
TIBC: 358 ug/dL (ref 250–450)
UIBC: 288 ug/dL

## 2022-09-06 LAB — FERRITIN: Ferritin: 18 ng/mL (ref 11–307)

## 2022-09-06 LAB — RETIC PANEL
Immature Retic Fract: 25.5 % — ABNORMAL HIGH (ref 2.3–15.9)
RBC.: 3.23 MIL/uL — ABNORMAL LOW (ref 3.87–5.11)
Retic Count, Absolute: 114.7 10*3/uL (ref 19.0–186.0)
Retic Ct Pct: 3.6 % — ABNORMAL HIGH (ref 0.4–3.1)
Reticulocyte Hemoglobin: 33.4 pg (ref >27.9)

## 2022-09-06 NOTE — Progress Notes (Signed)
Patient wants to discuss stopping some of her meds.  She did have black stools.  Stopped the Iron and normal colored stools again.  States bruising and would like to discuss stopping Eloquis.

## 2022-09-06 NOTE — Progress Notes (Signed)
[facility] [place] [facility] [place]. [facility] [address] [place], [place] [address] [phone] (phone) [phone] (fax)  Patient Care Team: Derinda Late, MD as PCP - General (Family Medicine) Kate Sable, MD as PCP - Cardiology (Cardiology) [name], MD as Consulting Physician (Hematology and Oncology)   Name of the patient: Andrea Williamson  [id number]  06-30-1951   Date of visit: 09/06/22  Diagnosis- Iron Deficiency and Hx of blood clot  Chief complaint/ Reason for visit- Medication burden  Heme/Onc history:   patient is a 71 year old female with a past medical history significant for hypertension and hyperlipidemia who was admitted to [facility] for symptoms of acute stroke when she presented with right lower extremity weakness and difficulty speaking on 10/01/2021.  CT head showed acute infarct in the left frontal lobe ACA territory.  She underwent cerebral angiogram and underwent thrombectomy with revascularization and left P2 stenting by Dr. Norma Fredrickson and subsequently discharged to rehab.  She was started on aspirin and Brilinta.  During that admission patient had a normal CBC with a white count of 9.3, H&H of 13/37.8 and a platelet count of 245.  Patient was noted to have increasing icterus as well as nausea and dizziness which prompted her visit to the ER.  On admission patient was noted to have a white cell count of 22, H&H of 4.5/13.4 and MCV of 111 with a platelet count of 352.  Total bilirubin was elevated at 6 thereby raising the concern for possible hemolytic anemia.  Patient was started on high-dose steroids and transferred to [facility].  She received 1 dose of rituximab there.  CT chest abdomen and pelvis with contrast did not show any evidence of malignancy but did show a possible Right adnexal mass which will be followed up by GYN as an outpatient. Plans for  surgery on hold due to recent DVT/PE.  Noted to have bilateral lower extremity DVT as well for which she is on Eliquis.   She presented with nosebleeds to ER. Hb dropped to 8. No overt GI bleeding. Eliquis was held. Ivc filter was inserted. New segmental PE. Eliquis therefore restarted. She is also on plavix  Oncology History   No history exists.    Interval history- Patient is 71 year old female with hx of iron deficiency anemia and PE, on eliquis, in setting of warm autoimmune hemolytic anemia, who presents to clinic for concerns of ongoing pill burden. She feels that she takes too many medications and is eager to see which medications can be stopped. Reports some black stools when taking iron which was worrisome to her. She stopped iron and black stools resolved. Denies missing eliquis doses but is concerned for her risk of bleeding. Says that she feels at baseline and denies physical complaints today. Says 'I didn't want to get old'. Reports briefly increasing her xanax but has now resumed prescribed dose. She continues surveillance with Dr. 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  ECOG FS:0 - Asymptomatic  Review of systems- Review of Systems  Constitutional:  Negative for chills, fever, malaise/fatigue and weight loss.  HENT:  Negative for hearing loss, nosebleeds, sore throat and tinnitus.   Eyes:  Negative for blurred vision and double vision.  Respiratory:  Negative for cough, hemoptysis, shortness of breath and wheezing.   Cardiovascular:  Negative for chest pain, palpitations and leg swelling.  Gastrointestinal:  Negative for abdominal pain, blood in stool, constipation, diarrhea, melena, nausea and vomiting.  Genitourinary:  Negative for dysuria and urgency.  Musculoskeletal:  Negative for back pain, falls, joint pain and myalgias.  Skin:  Negative for itching and rash.  Neurological:  Negative for dizziness, tingling,  sensory change, loss of consciousness, weakness and headaches.  Endo/Heme/Allergies:  Negative for environmental allergies. Does not bruise/bleed easily.  Psychiatric/Behavioral:  Negative for depression. The patient is nervous/anxious. The patient does not have insomnia.      No Known Allergies  Past Medical History:  Diagnosis Date   Atherosclerosis    Deep vein thrombosis (DVT) ([place])    Hemolytic anemia ([place])    Hypertension    Stroke [facility])     Past Surgical History:  Procedure Laterality Date   ANKLE SURGERY Left 2008   IR CT HEAD LTD  10/05/2021   IR INTRA CRAN STENT  10/01/2021   IR PERCUTANEOUS ART THROMBECTOMY/INFUSION INTRACRANIAL INC DIAG ANGIO  10/01/2021   IR US GUIDE VASC ACCESS RIGHT  10/01/2021   IVC FILTER INSERTION N/A 01/06/2022   Procedure: IVC FILTER INSERTION;  Surgeon: Algernon Huxley, MD;  Location: [place] [facility];  Service: Cardiovascular;  Laterality: N/A;   RADIOLOGY WITH ANESTHESIA N/A 10/01/2021   Procedure: IR WITH ANESTHESIA;  Surgeon: Radiologist, Medication, MD;  Location: [place];  Service: Radiology;  Laterality: N/A;   ROBOTIC ASSISTED BILATERAL SALPINGO OOPHERECTOMY Bilateral 06/09/2022   Procedure: XI ROBOTIC ASSISTED BILATERAL SALPINGO OOPHORECTOMY, STAGING INCLUDING TOTAL HYSTERECTOMY, OMENTECTOMY PERITONEAL BIOPSIES;  Surgeon: [name], MD;  Location: [facility];  Service: Gynecology;  Laterality: Bilateral;    Social History   Socioeconomic History   Marital status: Married    Spouse name: Not on file   Number of children: 3   Years of education: 12   Highest education level: 12th grade  Occupational History   Occupation: Retired  Tobacco Use   Smoking status: Former    Packs/day: 1.50    Years: 50.00    Total pack years: 75.00    Types: Cigarettes    Quit date: 09/2021    Years since quitting: 0.9   Smokeless tobacco: Never  Vaping Use   Vaping Use: Never used  Substance and Sexual Activity   Alcohol use: Not  Currently   Drug use: Not Currently   Sexual activity: Not Currently  Other Topics Concern   Not on file  Social History Narrative   October 2022 stopped   Social Determinants of Health   Financial Resource Strain: Low Risk  (11/26/2021)   Overall Financial Resource Strain (CARDIA)    Difficulty of Paying Living Expenses: Not hard at all  Food Insecurity: No Food Insecurity (11/26/2021)   Hunger Vital Sign    Worried About Running Out of Food in the Last Year: Never true    [place] in the Last Year: Never true  Transportation Needs: No Transportation Needs (11/26/2021)   PRAPARE - Hydrologist (Medical): No    Lack of Transportation (Non-Medical): No  Physical Activity: Insufficiently Active (11/26/2021)   Exercise Vital Sign    Days of Exercise per Week: 2 days    Minutes of Exercise per Session: 60 min  Stress: Stress Concern Present (11/26/2021)   [place]    Feeling of Stress : To some extent  Social Connections: Moderately Isolated (11/26/2021)   Social Connection and  Isolation Panel [NHANES]    Frequency of Communication with Friends and Family: More than three times a week    Frequency of Social Gatherings with Friends and Family: More than three times a week    Attends Religious Services: Never    Marine scientist or Organizations: No    Attends Archivist Meetings: Never    Marital Status: Married  Human resources officer Violence: Not At Risk (11/26/2021)   Humiliation, Afraid, Rape, and Kick questionnaire    Fear of Current or Ex-Partner: No    Emotionally Abused: No    Physically Abused: No    Sexually Abused: No    Family History  Problem Relation Age of Onset   COPD Mother    Cancer Mother    Colon cancer Neg Hx    Breast cancer Neg Hx    Ovarian cancer Neg Hx    Endometrial cancer Neg Hx    Prostate cancer Neg Hx    Pancreatic cancer Neg Hx       Current Outpatient Medications:    ALPRAZolam (XANAX) 0.25 MG tablet, TAKE 1 TABLET BY MOUTH AT BEDTIME, Disp: 30 tablet, Rfl: 0   amLODipine (NORVASC) 5 MG tablet, Take 1 tablet (5 mg total) by mouth daily., Disp: 90 tablet, Rfl: 1   citalopram (CELEXA) 10 MG tablet, Take 10 mg by mouth daily., Disp: , Rfl:    clopidogrel (PLAVIX) 75 MG tablet, Take 75 mg by mouth daily., Disp: , Rfl:    ELIQUIS 5 MG TABS tablet, TAKE 1 TABLET(5 MG) BY MOUTH TWICE DAILY, Disp: 60 tablet, Rfl: 4   folic acid (FOLVITE) 1 MG tablet, Take 1 tablet (1 mg total) by mouth daily., Disp: , Rfl:    losartan (COZAAR) 50 MG tablet, Take 1 tablet (50 mg total) by mouth daily., Disp: 90 tablet, Rfl: 1   metoprolol succinate (TOPROL XL) 25 MG 24 hr tablet, Take 1 tablet (25 mg total) by mouth daily., Disp: 30 tablet, Rfl: 11   pantoprazole (PROTONIX) 40 MG tablet, TAKE 1 TABLET(40 MG) BY MOUTH DAILY, Disp: 90 tablet, Rfl: 1   REPATHA SURECLICK 619 MG/ML SOAJ, ADMINISTER 1 ML UNDER THE SKIN EVERY 14 DAYS, Disp: 2 mL, Rfl: 1   ferrous sulfate 325 (65 FE) MG tablet, Take 325 mg by mouth daily with breakfast. (Patient not taking: Reported on 09/06/2022), Disp: , Rfl:    magnesium gluconate (MAGONATE) 500 MG tablet, Take 0.5 tablets (250 mg total) by mouth at bedtime. (Patient not taking: Reported on 09/06/2022), Disp: 30 tablet, Rfl: 0  Physical exam:  Vitals:   09/06/22 1011  BP: (!) 151/60  Pulse: (!) 52  Temp: (!) 97.2 F (36.2 C)  TempSrc: Tympanic  Weight: 166 lb 4.8 oz (75.4 kg)  Height: '5\' 2"'$  (1.575 m)   Physical Exam Constitutional:      General: She is not in acute distress. HENT:     Head: Normocephalic.  Cardiovascular:     Rate and Rhythm: Normal rate and regular rhythm.  Pulmonary:     Effort: No respiratory distress.  Skin:    Coloration: Skin is not pale.  Neurological:     Mental Status: She is alert and oriented to person, place, and time.  Psychiatric:        Mood and Affect: Mood  normal.        Behavior: Behavior normal.         Latest Ref Rng & Units 06/03/2022    2:25  PM  CMP  Glucose 70 - 99 mg/dL 87   BUN 8 - 23 mg/dL 28   Creatinine 0.44 - 1.00 mg/dL 1.21   Sodium 135 - 145 mmol/L 144   Potassium 3.5 - 5.1 mmol/L 4.1   Chloride 98 - 111 mmol/L 113   CO2 22 - 32 mmol/L 24   Calcium 8.9 - 10.3 mg/dL 9.6   Total Protein 6.5 - 8.1 g/dL 7.1   Total Bilirubin 0.3 - 1.2 mg/dL 0.4   Alkaline Phos 38 - 126 U/L 75   AST 15 - 41 U/L 19   ALT 0 - 44 U/L 18       Latest Ref Rng & Units 09/06/2022    9:44 AM  CBC  WBC 4.0 - 10.5 K/uL 4.7   Hemoglobin 12.0 - 15.0 g/dL 9.9   Hematocrit 36.0 - 46.0 % 30.9   Platelets 150 - 400 K/uL 268    Iron/TIBC/Ferritin/ %Sat    Component Value Date/Time   IRON 70 09/06/2022 0944   TIBC 358 09/06/2022 0944   FERRITIN 18 09/06/2022 0944   IRONPCTSAT 20 09/06/2022 0944   No images are attached to the encounter.  No results found.  Assessment and plan- Patient is a 71 y.o. female   Warm autoimmune hemolytic anemia: s/p rituximab, last 11/2021. Hemoglobin normally between 11-12. Haptoglobin and reticulocyte count are stable, therefore, not indicative of ongoing hemolysis. Monitor.    History of DVT and PE: Patient was diagnosed with DVT when she had warm autoimmune embolic anemia in December 2022 which was likely the trigger.  She had some nosebleed following that and her Eliquis was briefly held when she was found to have a subsegmental PE in February 2023.  She has an IVC filter in place. We reviewed the rationale for taking Eliquis as well as potential risks vs benefits. She will continue Eliquis for now and can follow up with Dr. Janese Banks for consideration of discontinuation.   Iron Deficiency Anemia- with hx of black stools, iron studies were checked. Reticulocyte hemoglobin was normal. Ferritin is low at 18 but TIBC and saturation ratio are normal. Her hemoglobin has dropped to 9.9 today, baseline around 11. No  microcytosis. Dr. Janese Banks recommends starting feraheme/IV iron. Reviewed risks of IV iron including allergic reaction and anaphylaxis. She agrees to continue. Plan for feraheme x 2.   Black stools- unclear if related to blood loss vs side effect of oral iron. Appears to correspond with taking OTC iron which is expected side effect. I've asked her to hold oral iron and monitor her stools for now. She has not ever had an EGD or colonoscopy which I recommended. She declines GI referral for now.   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 Berline Lopes. Pathology, pT1c, pNx. Washings were suspicious for malignancy. No adjuvant treatment was recommended however, close surveillance.   Medication burden- lengthy discussion regarding her medications today including rationales for use. Encouraged her to speak to her prescribing providers re: risks and benefits. She agrees to continue medications for now.    Disposition: Feraheme x 2 Follow up with Dr. Janese Banks as scheduled   Visit Diagnosis 1. Warm autoimmune hemolytic anemia (HCC)   2. Medication non-compliance due to excessive pill burden   3. Other autoimmune hemolytic anemia (HCC)   4. Other iron deficiency anemia   5. Black stools     Patient expressed understanding and was in agreement with this plan. She also understands that She can  call clinic at any time with any questions, concerns, or complaints.   Thank you for allowing me to participate in the care of this very pleasant patient.   I discussed the assessment and treatment plan with the patient. The patient was provided an opportunity to ask questions and all were answered. The patient agreed with the plan and demonstrated an understanding of the instructions.   The patient was advised to call back or seek an in-person evaluation if the symptoms worsen or if the condition fails to improve as anticipated.   I spent 45 minutes face-to-face visit time dedicated to the care of this  patient on the date of this encounter to including pre-visit review of notes, labs, face-to-face time with the patient, and post visit ordering of testing/documentation.   Beckey Rutter, DNP, AGNP-C [place] [facility] [place]

## 2022-09-07 LAB — HAPTOGLOBIN: Haptoglobin: 111 mg/dL (ref 37–355)

## 2022-09-09 ENCOUNTER — Telehealth: Payer: Self-pay | Admitting: Oncology

## 2022-09-09 NOTE — Telephone Encounter (Signed)
Attempt made to reach patient on home and mobile phone. VM left requesting that patient call back in order to get scheduled for IV iron per Dr. Janese Banks.

## 2022-09-13 ENCOUNTER — Encounter: Payer: Self-pay | Admitting: Oncology

## 2022-09-13 ENCOUNTER — Encounter (INDEPENDENT_AMBULATORY_CARE_PROVIDER_SITE_OTHER): Payer: Self-pay

## 2022-09-13 ENCOUNTER — Inpatient Hospital Stay: Payer: Medicare Other

## 2022-09-13 ENCOUNTER — Other Ambulatory Visit: Payer: Self-pay | Admitting: Oncology

## 2022-09-13 VITALS — BP 127/50 | HR 57 | Temp 96.8°F | Resp 20

## 2022-09-13 DIAGNOSIS — D509 Iron deficiency anemia, unspecified: Secondary | ICD-10-CM | POA: Insufficient documentation

## 2022-09-13 DIAGNOSIS — D508 Other iron deficiency anemias: Secondary | ICD-10-CM

## 2022-09-13 DIAGNOSIS — D5911 Warm autoimmune hemolytic anemia: Secondary | ICD-10-CM | POA: Diagnosis not present

## 2022-09-13 MED ORDER — SODIUM CHLORIDE 0.9 % IV SOLN
510.0000 mg | INTRAVENOUS | Status: DC
  Administered 2022-09-13: 510 mg via INTRAVENOUS
  Filled 2022-09-13: qty 17

## 2022-09-13 MED ORDER — SODIUM CHLORIDE 0.9 % IV SOLN
Freq: Once | INTRAVENOUS | Status: AC
  Filled 2022-09-13: qty 250

## 2022-09-13 NOTE — Patient Instructions (Signed)

## 2022-09-16 ENCOUNTER — Ambulatory Visit: Payer: Medicare Other | Attending: Cardiology | Admitting: Cardiology

## 2022-09-16 ENCOUNTER — Encounter: Payer: Self-pay | Admitting: Cardiology

## 2022-09-16 VITALS — BP 148/84 | HR 59 | Ht 62.0 in | Wt 162.4 lb

## 2022-09-16 DIAGNOSIS — E7800 Pure hypercholesterolemia, unspecified: Secondary | ICD-10-CM

## 2022-09-16 DIAGNOSIS — I2510 Atherosclerotic heart disease of native coronary artery without angina pectoris: Secondary | ICD-10-CM

## 2022-09-16 DIAGNOSIS — I10 Essential (primary) hypertension: Secondary | ICD-10-CM

## 2022-09-16 NOTE — Progress Notes (Signed)
Cardiology Office Note:    Date:  09/16/2022   ID:  Andrea Williamson, DOB 1950-12-09, MRN [id number]  PCP:  Derinda Late, MD   [facility] Providers Cardiologist:  Kate Sable, MD     Referring MD: Derinda Late, MD   Chief Complaint  Patient presents with   Follow-up    3 month f/u, no new cardiac concerns     History of Present Illness:    Andrea Williamson is a 71 y.o. female with a hx of CAD (coronary calcification on chest CT),  hypertension, former smoker x50+ years, CVA s/p thrombectomy and left A2 stent 09/2021, bilateral DVT, hemolytic anemia presenting for follow-up.  Being seen for hypertension, coronary calcification.  Undergoing a lot of stress at the moment due to husband dying from metastatic throat cancer.  Her husband is currently on home hospice.  Has not check her blood pressure recently at   Prior notes Echo 12/2021 EF 65 to 70% Echocardiogram 09/2021 EF 65-70, impaired relaxation.  Moderate LA dilatation. 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  She had a stroke in November 2022, started on aspirin and Brilinta.  Subsequently diagnosed with bilateral BK DVT, Eliquis was started.  Being followed by hematology and neurology.  Stopped smoking after stroke.  Past Medical History:  Diagnosis Date   Atherosclerosis    Deep vein thrombosis (DVT) ([place])    Hemolytic anemia ([place])    Hypertension    Stroke [facility])     Past Surgical History:  Procedure Laterality Date   ANKLE SURGERY Left 2008   IR CT HEAD LTD  10/05/2021   IR INTRA CRAN STENT  10/01/2021   IR PERCUTANEOUS ART THROMBECTOMY/INFUSION INTRACRANIAL INC DIAG ANGIO  10/01/2021   IR US GUIDE VASC ACCESS RIGHT  10/01/2021   IVC FILTER INSERTION N/A 01/06/2022   Procedure: IVC FILTER INSERTION;  Surgeon: Algernon Huxley, MD;  Location: [place] [facility];  Service: Cardiovascular;  Laterality: N/A;   RADIOLOGY WITH  ANESTHESIA N/A 10/01/2021   Procedure: IR WITH ANESTHESIA;  Surgeon: Radiologist, Medication, MD;  Location: [place];  Service: Radiology;  Laterality: N/A;   ROBOTIC ASSISTED BILATERAL SALPINGO OOPHERECTOMY Bilateral 06/09/2022   Procedure: XI ROBOTIC ASSISTED BILATERAL SALPINGO OOPHORECTOMY, STAGING INCLUDING TOTAL HYSTERECTOMY, OMENTECTOMY PERITONEAL BIOPSIES;  Surgeon: [name], MD;  Location: [facility];  Service: Gynecology;  Laterality: Bilateral;    Current Medications: No outpatient medications have been marked as taking for the 09/16/22 encounter (Office Visit) with Kate Sable, MD.     Allergies:   Patient has no known allergies.   Social History   Socioeconomic History   Marital status: Married    Spouse name: Not on file   Number of children: 3   Years of education: 12   Highest education level: 12th grade  Occupational History   Occupation: Retired  Tobacco Use   Smoking status: Former    Packs/day: 1.50    Years: 50.00    Total pack years: 75.00    Types: Cigarettes    Quit date: 09/2021    Years since quitting: 1.0   Smokeless tobacco: Never  Vaping Use   Vaping Use: Never used  Substance and Sexual Activity   Alcohol use: Not Currently   Drug use: Not Currently   Sexual activity: Not Currently  Other Topics Concern   Not on file  Social History Narrative   October 2022 stopped  Social Determinants of Health   Financial Resource Strain: Low Risk  (11/26/2021)   Overall Financial Resource Strain (CARDIA)    Difficulty of Paying Living Expenses: Not hard at all  Food Insecurity: No Food Insecurity (11/26/2021)   Hunger Vital Sign    Worried About Running Out of Food in the Last Year: Never true    Ran Out of Food in the Last Year: Never true  Transportation Needs: No Transportation Needs (11/26/2021)   PRAPARE - Hydrologist (Medical): No    Lack of Transportation (Non-Medical): No  Physical Activity:  Insufficiently Active (11/26/2021)   Exercise Vital Sign    Days of Exercise per Week: 2 days    Minutes of Exercise per Session: 60 min  Stress: Stress Concern Present (11/26/2021)   [place]    Feeling of Stress : To some extent  Social Connections: Moderately Isolated (11/26/2021)   Social Connection and Isolation Panel [NHANES]    Frequency of Communication with Friends and Family: More than three times a week    Frequency of Social Gatherings with Friends and Family: More than three times a week    Attends Religious Services: Never    Marine scientist or Organizations: No    Attends Music therapist: Never    Marital Status: Married     Family History: The patient's family history includes COPD in her mother; Cancer in her mother. There is no history of Colon cancer, Breast cancer, Ovarian cancer, Endometrial cancer, Prostate cancer, or Pancreatic cancer.  ROS:   Please see the history of present illness.     All other systems reviewed and are negative.  EKGs/Labs/Other Studies Reviewed:    The following studies were reviewed today:   EKG:  EKG is ordered today.  EKG shows sinus bradycardia, heart rate 59, otherwise normal ECG  Recent Labs: 01/06/2022: TSH 1.613 01/13/2022: Magnesium 1.9 06/03/2022: ALT 18; BUN 28; Creatinine, Ser 1.21; Potassium 4.1; Sodium 144 09/06/2022: Hemoglobin 9.9; Platelets 268  Recent Lipid Panel    Component Value Date/Time   CHOL 182 06/15/2022 0837   TRIG 198 (H) 06/15/2022 0837   HDL 65 06/15/2022 0837   CHOLHDL 2.8 06/15/2022 0837   VLDL 40 06/15/2022 0837   LDLCALC 77 06/15/2022 0837     Risk Assessment/Calculations:          Physical Exam:    VS:  BP (!) 148/84 (BP Location: Left Arm, Patient Position: Sitting, Cuff Size: Normal)   Pulse (!) 59   Ht '5\' 2"'$  (1.575 m)   Wt 162 lb 6.4 oz (73.7 kg)   SpO2 98%   BMI 29.70 kg/m     Wt Readings  from Last 3 Encounters:  09/16/22 162 lb 6.4 oz (73.7 kg)  09/06/22 166 lb 4.8 oz (75.4 kg)  07/02/22 159 lb (72.1 kg)     GEN:  Well nourished, well developed in no acute distress HEENT: Normal NECK: No JVD; No carotid bruits CARDIAC: RRR, no murmurs, rubs, gallops RESPIRATORY:  Clear to auscultation without rales, wheezing or rhonchi  ABDOMEN: Soft, non-tender, non-distended MUSCULOSKELETAL:  No edema; No deformity  SKIN: Warm and dry NEUROLOGIC:  Alert and oriented x 3 PSYCHIATRIC:  Normal affect   ASSESSMENT:    1. Coronary artery disease involving native coronary artery of native heart without angina pectoris   2. Primary hypertension   3. Pure hypercholesterolemia    PLAN:  In order of problems listed above:  Coronary calcification, on Eliquis for DVT.  Continue Repatha.  Denies chest pain.  Echo with normal EF 65%. Hypertension, BP elevated, under a lot of stress.  Continue losartan, Norvasc, Toprol-XL as prescribed.  Consider titrating losartan if BP stays elevated. Hyperlipidemia, cholesterol better controlled.  Continue Repatha.  Follow-up in 3-4 months.     Medication Adjustments/Labs and Tests Ordered: Current medicines are reviewed at length with the patient today.  Concerns regarding medicines are outlined above.  Orders Placed This Encounter  Procedures   EKG 12-Lead   No orders of the defined types were placed in this encounter.   Patient Instructions  Check your blood pressure 1-2 hours after your medications in the morning and let us know if it is running high consistently.  Follow-Up: At [facility], you and your health needs are our priority.  As part of our continuing mission to provide you with exceptional heart care, we have created designated Provider Care Teams.  These Care Teams include your primary Cardiologist (physician) and Advanced Practice Providers (APPs -  Physician Assistants and Nurse Practitioners) who all work together  to provide you with the care you need, when you need it.  We recommend signing up for the patient portal called "MyChart".  Sign up information is provided on this After Visit Summary.  MyChart is used to connect with patients for Virtual Visits (Telemedicine).  Patients are able to view lab/test results, encounter notes, upcoming appointments, etc.  Non-urgent messages can be sent to your provider as well.   To learn more about what you can do with MyChart, go to [email or web address].    Your next appointment:   Follow up 3-4 month(s)  The format for your next appointment:   In Person  Provider:   Kate Sable, MD    Other Instructions    Important Information About Sugar         Signed, Kate Sable, MD  09/16/2022 11:13 AM    [place]

## 2022-09-16 NOTE — Patient Instructions (Addendum)
Check your blood pressure 1-2 hours after your medications in the morning and let us know if it is running high consistently.  Follow-Up: At [facility], you and your health needs are our priority.  As part of our continuing mission to provide you with exceptional heart care, we have created designated Provider Care Teams.  These Care Teams include your primary Cardiologist (physician) and Advanced Practice Providers (APPs -  Physician Assistants and Nurse Practitioners) who all work together to provide you with the care you need, when you need it.  We recommend signing up for the patient portal called "MyChart".  Sign up information is provided on this After Visit Summary.  MyChart is used to connect with patients for Virtual Visits (Telemedicine).  Patients are able to view lab/test results, encounter notes, upcoming appointments, etc.  Non-urgent messages can be sent to your provider as well.   To learn more about what you can do with MyChart, go to [email or web address].    Your next appointment:   Follow up 3-4 month(s)  The format for your next appointment:   In Person  Provider:   Kate Sable, MD    Other Instructions    Important Information About Sugar

## 2022-09-18 ENCOUNTER — Other Ambulatory Visit: Payer: Self-pay | Admitting: Internal Medicine

## 2022-09-20 ENCOUNTER — Inpatient Hospital Stay: Payer: Medicare Other | Attending: Oncology

## 2022-09-20 ENCOUNTER — Telehealth: Payer: Self-pay

## 2022-09-20 VITALS — BP 152/76 | HR 68 | Temp 97.2°F | Resp 18

## 2022-09-20 DIAGNOSIS — D508 Other iron deficiency anemias: Secondary | ICD-10-CM

## 2022-09-20 DIAGNOSIS — D5911 Warm autoimmune hemolytic anemia: Secondary | ICD-10-CM | POA: Insufficient documentation

## 2022-09-20 DIAGNOSIS — I10 Essential (primary) hypertension: Secondary | ICD-10-CM | POA: Diagnosis not present

## 2022-09-20 DIAGNOSIS — E611 Iron deficiency: Secondary | ICD-10-CM | POA: Diagnosis present

## 2022-09-20 DIAGNOSIS — Z79899 Other long term (current) drug therapy: Secondary | ICD-10-CM | POA: Diagnosis not present

## 2022-09-20 MED ORDER — SODIUM CHLORIDE 0.9 % IV SOLN
Freq: Once | INTRAVENOUS | Status: AC
  Filled 2022-09-20: qty 250

## 2022-09-20 MED ORDER — SODIUM CHLORIDE 0.9 % IV SOLN
510.0000 mg | INTRAVENOUS | Status: DC
  Administered 2022-09-20: 510 mg via INTRAVENOUS
  Filled 2022-09-20: qty 17

## 2022-09-20 NOTE — Patient Instructions (Signed)

## 2022-09-20 NOTE — Telephone Encounter (Signed)
Key: [id number] Repatha SureClick '140MG'$ /ML auto-injectors   Form [employer] Medicare Part D Electronic Prior Authorization Form (2017 [place]CPDP)

## 2022-09-28 ENCOUNTER — Other Ambulatory Visit: Payer: Self-pay | Admitting: Neurology

## 2022-10-13 ENCOUNTER — Ambulatory Visit: Payer: Medicare Other | Admitting: Oncology

## 2022-10-13 ENCOUNTER — Other Ambulatory Visit: Payer: Medicare Other

## 2022-10-14 ENCOUNTER — Ambulatory Visit: Payer: Medicare Other | Admitting: Neurology

## 2022-10-14 VITALS — BP 145/85 | HR 51 | Ht 62.0 in | Wt 164.0 lb

## 2022-10-14 DIAGNOSIS — Z8673 Personal history of transient ischemic attack (TIA), and cerebral infarction without residual deficits: Secondary | ICD-10-CM

## 2022-10-14 DIAGNOSIS — G250 Essential tremor: Secondary | ICD-10-CM | POA: Diagnosis not present

## 2022-10-14 NOTE — Patient Instructions (Signed)
I had a long d/w patient  and her daughter in law about her recent stroke,intracranial stent, risk for recurrent stroke/TIAs, personally independently reviewed imaging studies and stroke evaluation results and answered questions.Continue Eliquis (apixaban) daily  for DVT and  Plavix 75 mg daily secondary stroke prevention for her intracranial stent . Maintain strict control of hypertension with blood pressure goal below 130/90, diabetes with hemoglobin A1c goal below 6.5% and lipids with LDL cholesterol goal below 70 mg/dL. I also advised the patient to eat a healthy diet with plenty of whole grains, cereals, fruits and vegetables, exercise regularly and maintain ideal body weight.   Patient also has longstanding history of mild benign essential tremor and not functionally disabling and she is not keen to try medications at the present time.  He is on low-dose metoprolol but given her resting heart rate of 51 do not recommend increasing the dose at the present time..  Followup in the future with me in 1 year or call earlier if necessary.  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  Essential Tremor A tremor is trembling or shaking that a person cannot control. Most tremors affect the hands or arms. Tremors can also affect the head, vocal cords, legs, and other parts of the body. Essential tremor is a tremor without a known cause. Usually, it occurs while a person is trying to perform an action. It tends to get worse gradually as a person ages. What are the causes? The cause of this condition is not known, but it often runs in families. What increases the risk? You are more likely to develop this condition if: You have a family member with essential tremor. You are 9 years of age or older. What are the signs or symptoms? The main sign of a tremor is a rhythmic shaking of certain parts of your body that is  uncontrolled and unintentional. You may: Have difficulty eating with a spoon or fork. Have difficulty writing. Nod your head up and down or side to side. Have a quivering voice. The shaking may: Get worse over time. Come and go. Be more noticeable on one side of your body. Get worse due to stress, tiredness (fatigue), caffeine, and extreme heat or cold. How is this diagnosed? This condition may be diagnosed based on: Your symptoms and medical history. A physical exam. There is no single test to diagnose an essential tremor. However, your health care provider may order tests to rule out other causes of your condition. These may include: Blood and urine tests. Imaging studies of your brain, such as a CT scan or MRI. How is this treated? Treatment for essential tremor depends on the severity of the condition. Mild tremors may not need treatment if they do not affect your day-to-day life. Severe tremors may need to be treated using one or more of the following options: Medicines. Injections of a substance called botulinum toxin. Procedures such as deep brain stimulation (DBS) implantation or MRI-guided ultrasound treatment. Lifestyle changes. Occupational or physical therapy. Follow these instructions at home: Lifestyle  Do not use any products that contain nicotine or tobacco. These products include cigarettes, chewing tobacco, and vaping devices, such as e-cigarettes. If you need help quitting, ask your health care provider. Limit your caffeine intake as told by your health care provider. Try to get 8 hours of sleep each night. Find ways to manage your stress that fit your  lifestyle and personality. Consider trying meditation or yoga. Try to anticipate stressful situations and allow extra time to manage them. If you are struggling emotionally with the effects of your tremor, consider working with a mental health provider. General instructions Take over-the-counter and prescription  medicines only as told by your health care provider. Avoid extreme heat and extreme cold. Keep all follow-up visits. This is important. Visits may include physical therapy visits. Where to find more information [employer] of Neurological Disorders and Stroke: [employer] Contact a health care provider if: You experience any changes in the location or intensity of your tremors. You start having a tremor after starting a new medicine. You have a tremor with other symptoms, such as: Numbness. Tingling. Pain. Weakness. Your tremor gets worse. Your tremor interferes with your daily life. You feel down, blue, or sad for at least 2 weeks in a row. Worrying about your tremor and what other people think about you interferes with your everyday life functions, including relationships, work, or school. Summary Essential tremor is a tremor without a known cause. Usually, it occurs when you are trying to perform an action. You are more likely to develop this condition if you have a family member with essential tremor. The main sign of a tremor is a rhythmic shaking of certain parts of your body that is uncontrolled and unintentional. Treatment for essential tremor depends on the severity of the condition. This information is not intended to replace advice given to you by your health care provider. Make sure you discuss any questions you have with your health care provider. Document Revised: 08/21/2021 Document Reviewed: 08/21/2021 Elsevier Patient Education  [employer].

## 2022-10-14 NOTE — Progress Notes (Signed)
[facility] [address] [place]. [place] [address] [phone]       OFFICE FOLLOW-UP NOTE  [name] Date of Birth:  1951/04/20 Medical Record Number:  [id number]   HPI: Initial visit 04/13/2022 Andrea Williamson is a 71 year old lady who is seen today for initial office follow-up visit following hospital admission for stroke in November 2022.  She is accompanied by sister-in-law and history is obtained from them and review of electronic medical records and I personally reviewed pertinent available imaging films in PACS.  She has past medical history of hyperlipidemia and hypertension who was admitted on 10/01/2021 to [facility] for right leg weakness which developed 2 days prior to admission.  She felt she was unsteady on her gait and her leg was no longer supporting her.  CT head showed acute left anterior cerebral artery infarct with CT angiogram showing left A2/ACA occlusion CT perfusion showed 0 core and only 27 cc of penumbra she was considered a candidate for revascularization and underwent mechanical thrombectomy with revascularization but vessel Occlusion Requiring a Rescue Left ACA Stent withTICI 3 revascularization.  MRI scan redemonstrated infarcts in left ACA and ACA/PCA watershed territory and 1 tiny infarct in the right medial parietal lobe.  Follow-up MRI showed patent left A2 stent.  2D echo showed ejection fraction 65 to 70%.  LDL cholesterol was elevated at 232 mg percent and hemoglobin A1c was 5.0.  Patient was on no antithrombotics prior to admission and was placed on aspirin and Brilinta.  She was seen by physical occupational and speech therapy recommended inpatient rehab.  With plans for dual antiplatelet therapy for 6 months then aspirin alone.  Patient states subsequently she has been diagnosed with deep vein thrombosis in December 2022 in bilateral lower extremities and has been switched to Brilinta and Eliquis which is tolerating well with easy  bruising but no major bleeding episode.  Recently the cost of Brilinta has gone up and her co-pay has jumped from $46-$300 and she can no longer afford it.  She did have an 1 episode of nasal bleed in February and was seen in the emergency room.  She is also subsequently been diagnosed with hemolytic anemia and sees hematologist Dr. Janese Banks in [place] and has appointment to see her next month.  She did have follow-up lipid profile on 02/04/2022 which still showed elevated LDL cholesterol of 11/25/2016 with total cholesterol 209.  She had a 2D echo on 01/10/2022 which showed normal ejection fraction of 65 to 70%.  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  She at present time feels tremor is not disabling and does not want medications for it. Update 10/14/2022 : She returns for follow-up after last visit 6 months ago.  She is accompanied by her daughter-in-law.  Patient states that her husband died 2 weeks ago from terminal cancer.  He is quite affected by this.  He fortunately had no recurrent stroke or TIA symptoms.  Remains on Plavix which is tolerating well without bruising or bleeding.  She is also on Eliquis for pulmonary embolism but does have an appointment to see her hematologist next week to see if she can come off it.  She did switch to Repatha injections.  Tolerating well without side effects.  Last lipid profile on 06/15/2022 showed LDL cholesterol to be near optimal at 77 mg percent.  Triglycerides still elevated at 198 she underwent robotic hysterectomy 05/2022.  Patient's daughter-in-law is concerned about having tremors.  Patient states she has had this for at least 20+ years.  Tremors are mild involving her hands occasionally she has trouble writing.  The tremors are not functionally disabling.  She does not want to try medications for this and.  Her Sister had similar tremors.  She  does not drink alcohol and does not know if it can affect her tremors ROS:   14 system review of systems is positive for bruising, nasal bleeding, hemolytic anemia, tremor renal failure, DVT and all other systems negative  PMH:  Past Medical History:  Diagnosis Date   Atherosclerosis    Deep vein thrombosis (DVT) (HCC)    Hemolytic anemia (HCC)    Hypertension    Stroke [facility])     Social History:  Social History   Socioeconomic History   Marital status: Married    Spouse name: Not on file   Number of children: 3   Years of education: 12   Highest education level: 12th grade  Occupational History   Occupation: Retired  Tobacco Use   Smoking status: Former    Packs/day: 1.50    Years: 50.00    Total pack years: 75.00    Types: Cigarettes    Quit date: 09/2021    Years since quitting: 1.0   Smokeless tobacco: Never  Vaping Use   Vaping Use: Never used  Substance and Sexual Activity   Alcohol use: Not Currently   Drug use: Not Currently   Sexual activity: Not Currently  Other Topics Concern   Not on file  Social History Narrative   October 2022 stopped   Social Determinants of Health   Financial Resource Strain: Low Risk  (11/26/2021)   Overall Financial Resource Strain (CARDIA)    Difficulty of Paying Living Expenses: Not hard at all  Food Insecurity: No Food Insecurity (11/26/2021)   Hunger Vital Sign    Worried About Running Out of Food in the Last Year: Never true    Ran Out of Food in the Last Year: Never true  Transportation Needs: No Transportation Needs (11/26/2021)   PRAPARE - Hydrologist (Medical): No    Lack of Transportation (Non-Medical): No  Physical Activity: Insufficiently Active (11/26/2021)   Exercise Vital Sign    Days of Exercise per Week: 2 days    Minutes of Exercise per Session: 60 min  Stress: Stress Concern Present (11/26/2021)   [place]     Feeling of Stress : To some extent  Social Connections: Moderately Isolated (11/26/2021)   Social Connection and Isolation Panel [NHANES]    Frequency of Communication with Friends and Family: More than three times a week    Frequency of Social Gatherings with Friends and Family: More than three times a week    Attends Religious Services: Never    Marine scientist or Organizations: No    Attends Archivist Meetings: Never    Marital Status: Married  Human resources officer Violence: Not At Risk (11/26/2021)   Humiliation, Afraid, Rape, and Kick questionnaire    Fear of Current or Ex-Partner: No    Emotionally Abused: No    Physically Abused: No    Sexually Abused: No    Medications:   Current Outpatient Medications on File Prior to Visit  Medication Sig Dispense Refill   ALPRAZolam (XANAX) 0.25  MG tablet TAKE 1 TABLET BY MOUTH AT BEDTIME 30 tablet 1   amLODipine (NORVASC) 5 MG tablet Take 1 tablet (5 mg total) by mouth daily. 90 tablet 1   citalopram (CELEXA) 10 MG tablet Take 10 mg by mouth daily.     clopidogrel (PLAVIX) 75 MG tablet Take 75 mg by mouth daily.     ELIQUIS 5 MG TABS tablet TAKE 1 TABLET(5 MG) BY MOUTH TWICE DAILY 60 tablet 4   losartan (COZAAR) 50 MG tablet Take 1 tablet (50 mg total) by mouth daily. 90 tablet 1   metoprolol succinate (TOPROL XL) 25 MG 24 hr tablet Take 1 tablet (25 mg total) by mouth daily. 30 tablet 11   pantoprazole (PROTONIX) 40 MG tablet TAKE 1 TABLET(40 MG) BY MOUTH DAILY 90 tablet 1   REPATHA SURECLICK 419 MG/ML SOAJ ADMINISTER 1 ML UNDER THE SKIN EVERY 14 DAYS 2 mL 1   No current facility-administered medications on file prior to visit.    Allergies:  No Known Allergies  Physical Exam General: well developed, well nourished, seated, in no evident distress Head: head normocephalic and atraumatic.  Neck: supple with no carotid or supraclavicular bruits Cardiovascular: regular rate and rhythm, no murmurs Musculoskeletal: no  deformity Skin:  no rash/petichiae Vascular:  Normal pulses all extremities Vitals:   10/14/22 1352  BP: (!) 145/85  Pulse: (!) 51   Neurologic Exam Mental Status: Awake and fully alert. Oriented to place and time. Recent and remote memory intact. Attention span, concentration and fund of knowledge appropriate. Mood and affect appropriate.  Cranial Nerves: Fundoscopic exam reveals sharp disc margins. Pupils equal, briskly reactive to light. Extraocular movements full without nystagmus. Visual fields full to confrontation. Hearing intact. Facial sensation intact. Williamson, tongue, palate moves normally and symmetrically.  Motor: Normal bulk and tone. Normal strength in all tested extremity muscles.  Diminished fine finger movements on the right.  Orbits left over right upper extremity.  Intermittent resting and action tremor mostly in the right upper extremity and to a much lesser extent in the left upper extremity.  No cogwheel rigidity or bradykinesia. Sensory.: intact to touch ,pinprick .position and vibratory sensation.  Coordination: Rapid alternating movements normal in all extremities. Finger-to-nose and heel-to-shin performed accurately bilaterally. Gait and Station: Arises from chair without difficulty. Stance is normal. Gait demonstrates normal stride length and balance . Able to heel, toe and tandem walk with  difficulty.  Reflexes: 1+ and symmetric. Toes downgoing.   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  Vascular risk factors of hyperlipidemia, hypertension and intracranial stenosis.  She also developed DVT in December 2022 and has been on Eliquis since then.  New complaints of longstanding mild bilateral upper extremity action tremor slightly benign essential tremor given family history for the same.  The  tremors are not functionally disabling.  Reluctant to try medications at the present     PLAN:I had a long d/w patient  and her daughter in law about her recent stroke,intracranial stent, risk for recurrent stroke/TIAs, personally independently reviewed imaging studies and stroke evaluation results and answered questions.Continue Eliquis (apixaban) daily  for DVT and  Plavix 75 mg daily secondary stroke prevention for her intracranial stent . Maintain strict control of hypertension with blood pressure goal below 130/90, diabetes with hemoglobin A1c goal below 6.5% and lipids with LDL  cholesterol goal below 70 mg/dL. I also advised the patient to eat a healthy diet with plenty of whole grains, cereals, fruits and vegetables, exercise regularly and maintain ideal body weight.   Patient also has longstanding history of mild benign essential tremor and not functionally disabling and she is not keen to try medications at the present time.  He is on low-dose metoprolol but given her resting heart rate of 51 do not recommend increasing the dose at the present time..  Followup in the future with me in 1 year or call earlier if necessary.  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 Greater than 50% of time during this 40 minute visit was spent on counseling,explanation of diagnosis of stroke, intracranial tremor and hypertension with tremor, planning of further management, discussion with patient and family and coordination of care Antony Contras, MD Note: This document was prepared with digital dictation and possible smart phrase technology. Any transcriptional errors that result from this process are unintentional

## 2022-10-19 ENCOUNTER — Other Ambulatory Visit: Payer: Self-pay | Admitting: *Deleted

## 2022-10-19 DIAGNOSIS — D5911 Warm autoimmune hemolytic anemia: Secondary | ICD-10-CM

## 2022-10-20 ENCOUNTER — Encounter: Payer: Self-pay | Admitting: Oncology

## 2022-10-20 ENCOUNTER — Other Ambulatory Visit: Payer: Self-pay | Admitting: *Deleted

## 2022-10-20 ENCOUNTER — Inpatient Hospital Stay (HOSPITAL_BASED_OUTPATIENT_CLINIC_OR_DEPARTMENT_OTHER): Payer: Medicare Other | Admitting: Oncology

## 2022-10-20 ENCOUNTER — Inpatient Hospital Stay: Payer: Medicare Other | Attending: Oncology

## 2022-10-20 VITALS — BP 148/97 | HR 76 | Temp 97.2°F | Resp 16 | Wt 166.6 lb

## 2022-10-20 DIAGNOSIS — R5383 Other fatigue: Secondary | ICD-10-CM | POA: Insufficient documentation

## 2022-10-20 DIAGNOSIS — D5911 Warm autoimmune hemolytic anemia: Secondary | ICD-10-CM

## 2022-10-20 DIAGNOSIS — Z7901 Long term (current) use of anticoagulants: Secondary | ICD-10-CM | POA: Insufficient documentation

## 2022-10-20 DIAGNOSIS — E785 Hyperlipidemia, unspecified: Secondary | ICD-10-CM | POA: Diagnosis not present

## 2022-10-20 DIAGNOSIS — R110 Nausea: Secondary | ICD-10-CM | POA: Insufficient documentation

## 2022-10-20 DIAGNOSIS — I10 Essential (primary) hypertension: Secondary | ICD-10-CM | POA: Diagnosis not present

## 2022-10-20 DIAGNOSIS — Z86718 Personal history of other venous thrombosis and embolism: Secondary | ICD-10-CM | POA: Diagnosis not present

## 2022-10-20 DIAGNOSIS — Z7902 Long term (current) use of antithrombotics/antiplatelets: Secondary | ICD-10-CM | POA: Insufficient documentation

## 2022-10-20 DIAGNOSIS — I2782 Chronic pulmonary embolism: Secondary | ICD-10-CM

## 2022-10-20 DIAGNOSIS — Z825 Family history of asthma and other chronic lower respiratory diseases: Secondary | ICD-10-CM | POA: Diagnosis not present

## 2022-10-20 DIAGNOSIS — Z809 Family history of malignant neoplasm, unspecified: Secondary | ICD-10-CM | POA: Insufficient documentation

## 2022-10-20 DIAGNOSIS — E611 Iron deficiency: Secondary | ICD-10-CM | POA: Diagnosis present

## 2022-10-20 DIAGNOSIS — Z87891 Personal history of nicotine dependence: Secondary | ICD-10-CM | POA: Insufficient documentation

## 2022-10-20 DIAGNOSIS — R42 Dizziness and giddiness: Secondary | ICD-10-CM | POA: Insufficient documentation

## 2022-10-20 DIAGNOSIS — Z79899 Other long term (current) drug therapy: Secondary | ICD-10-CM | POA: Insufficient documentation

## 2022-10-20 DIAGNOSIS — Z8673 Personal history of transient ischemic attack (TIA), and cerebral infarction without residual deficits: Secondary | ICD-10-CM | POA: Insufficient documentation

## 2022-10-20 DIAGNOSIS — Z86711 Personal history of pulmonary embolism: Secondary | ICD-10-CM

## 2022-10-20 LAB — CBC WITH DIFFERENTIAL/PLATELET
Abs Immature Granulocytes: 0.09 10*3/uL — ABNORMAL HIGH (ref 0.00–0.07)
Basophils Absolute: 0 10*3/uL (ref 0.0–0.1)
Basophils Relative: 1 %
Eosinophils Absolute: 0.2 10*3/uL (ref 0.0–0.5)
Eosinophils Relative: 4 %
HCT: 34.9 % — ABNORMAL LOW (ref 36.0–46.0)
Hemoglobin: 11.6 g/dL — ABNORMAL LOW (ref 12.0–15.0)
Immature Granulocytes: 1 %
Lymphocytes Relative: 24 %
Lymphs Abs: 1.5 10*3/uL (ref 0.7–4.0)
MCH: 32.3 pg (ref 26.0–34.0)
MCHC: 33.2 g/dL (ref 30.0–36.0)
MCV: 97.2 fL (ref 80.0–100.0)
Monocytes Absolute: 0.5 10*3/uL (ref 0.1–1.0)
Monocytes Relative: 8 %
Neutro Abs: 3.9 10*3/uL (ref 1.7–7.7)
Neutrophils Relative %: 62 %
Platelets: 254 10*3/uL (ref 150–400)
RBC: 3.59 MIL/uL — ABNORMAL LOW (ref 3.87–5.11)
RDW: 13.6 % (ref 11.5–15.5)
WBC: 6.2 10*3/uL (ref 4.0–10.5)
nRBC: 0 % (ref 0.0–0.2)

## 2022-10-20 LAB — COMPREHENSIVE METABOLIC PANEL
ALT: 19 U/L (ref 0–44)
AST: 19 U/L (ref 15–41)
Albumin: 4.1 g/dL (ref 3.5–5.0)
Alkaline Phosphatase: 70 U/L (ref 38–126)
Anion gap: 7 (ref 5–15)
BUN: 33 mg/dL — ABNORMAL HIGH (ref 8–23)
CO2: 25 mmol/L (ref 22–32)
Calcium: 9.3 mg/dL (ref 8.9–10.3)
Chloride: 109 mmol/L (ref 98–111)
Creatinine, Ser: 1.44 mg/dL — ABNORMAL HIGH (ref 0.44–1.00)
GFR, Estimated: 39 mL/min — ABNORMAL LOW (ref >60)
Glucose, Bld: 95 mg/dL (ref 70–99)
Potassium: 4.2 mmol/L (ref 3.5–5.1)
Sodium: 141 mmol/L (ref 135–145)
Total Bilirubin: 0.5 mg/dL (ref 0.3–1.2)
Total Protein: 6.9 g/dL (ref 6.5–8.1)

## 2022-10-20 LAB — RETICULOCYTES
Immature Retic Fract: 18.1 % — ABNORMAL HIGH (ref 2.3–15.9)
RBC.: 3.57 MIL/uL — ABNORMAL LOW (ref 3.87–5.11)
Retic Count, Absolute: 103.2 10*3/uL (ref 19.0–186.0)
Retic Ct Pct: 2.9 % (ref 0.4–3.1)

## 2022-10-20 NOTE — Progress Notes (Signed)
Pts husband passed away 3 weeks ago.

## 2022-10-20 NOTE — Progress Notes (Signed)
Hematology/Oncology Consult note [facility]  [phone] [phone]  Patient Care Team: Derinda Late, MD as PCP - General (Family Medicine) Kate Sable, MD as PCP - Cardiology (Cardiology) [name], MD as Consulting Physician (Hematology and Oncology)   Name of the patient: Andrea Williamson  [id number]  1950-12-15   Date of visit: 10/20/22  Diagnosis- 1. Warm autoimmune hemolytic anemia 2. H/o DVT and PE  Chief complaint/ Reason for visit- routine f/u of hemolytic anemia and pe   Heme/Onc history: patient is a 71 year old female with a past medical history significant for hypertension and hyperlipidemia who was admitted to [facility] for symptoms of acute stroke when she presented with right lower extremity weakness and difficulty speaking on 10/01/2021.  CT head showed acute infarct in the left frontal lobe ACA territory.  She underwent cerebral angiogram and underwent thrombectomy with revascularization and left P2 stenting by Dr. Norma Fredrickson and subsequently discharged to rehab.  She was started on aspirin and Brilinta.  During that admission patient had a normal CBC with a white count of 9.3, H&H of 13/37.8 and a platelet count of 245.  Patient was noted to have increasing icterus as well as nausea and dizziness which prompted her visit to the ER.  On admission patient was noted to have a white cell count of 22, H&H of 4.5/13.4 and MCV of 111 with a platelet count of 352.  Total bilirubin was elevated at 6 thereby raising the concern for possible hemolytic anemia.  Patient was started on high-dose steroids and transferred to  [facility].  She received 1 dose of rituximab there.    CT chest abdomen and pelvis with contrast did not show any evidence of malignancy but did show a possible Right adnexal mass for which she saw Dr. Berline Lopes from GYN oncology.  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 Hb dropped to 8. No overt GI bleeding. Eliquis was held. Ivc filter was inserted. New segmental PE. Eliquis therefore restarted. She is also on plavix  Interval history-patient is mourning the loss of her husband about 3 weeks ago.  She is still currently on Eliquis as well as Plavix.  She does report bruising because of that and would like to come off anticoagulation if possible.  ECOG PS- 1 Pain scale- 0   Review of systems- Review of Systems  Constitutional:  Positive for malaise/fatigue. Negative for chills, fever and weight loss.  HENT:  Negative for congestion, ear discharge and nosebleeds.   Eyes:  Negative for blurred vision.  Respiratory:  Negative for cough, hemoptysis, sputum production, shortness of breath and wheezing.   Cardiovascular:  Negative for chest pain, palpitations, orthopnea and claudication.  Gastrointestinal:  Negative for abdominal pain, blood in stool, constipation, diarrhea, heartburn, melena, nausea and vomiting.  Genitourinary:  Negative for dysuria, flank pain, frequency, hematuria and urgency.  Musculoskeletal:  Negative for back pain, joint pain and myalgias.  Skin:  Negative for rash.  Neurological:  Negative for dizziness, tingling, focal weakness, seizures, weakness and headaches.  Endo/Heme/Allergies:  Does not bruise/bleed easily.  Psychiatric/Behavioral:  Negative for depression and suicidal ideas. The patient does not have insomnia.       No Known Allergies   Past Medical History:  Diagnosis Date   Atherosclerosis    Deep vein thrombosis (  DVT) ([place])    Hemolytic anemia ([place])    Hypertension    Stroke [facility])      Past Surgical History:  Procedure Laterality Date   ANKLE SURGERY Left 2008   IR CT HEAD LTD  10/05/2021   IR INTRA CRAN STENT  10/01/2021   IR PERCUTANEOUS ART THROMBECTOMY/INFUSION  INTRACRANIAL INC DIAG ANGIO  10/01/2021   IR US GUIDE VASC ACCESS RIGHT  10/01/2021   IVC FILTER INSERTION N/A 01/06/2022   Procedure: IVC FILTER INSERTION;  Surgeon: Algernon Huxley, MD;  Location: [place] [facility];  Service: Cardiovascular;  Laterality: N/A;   RADIOLOGY WITH ANESTHESIA N/A 10/01/2021   Procedure: IR WITH ANESTHESIA;  Surgeon: Radiologist, Medication, MD;  Location: [place];  Service: Radiology;  Laterality: N/A;   ROBOTIC ASSISTED BILATERAL SALPINGO OOPHERECTOMY Bilateral 06/09/2022   Procedure: XI ROBOTIC ASSISTED BILATERAL SALPINGO OOPHORECTOMY, STAGING INCLUDING TOTAL HYSTERECTOMY, OMENTECTOMY PERITONEAL BIOPSIES;  Surgeon: [name], MD;  Location: [facility];  Service: Gynecology;  Laterality: Bilateral;    Social History   Socioeconomic History   Marital status: Married    Spouse name: Not on file   Number of children: 3   Years of education: 12   Highest education level: 12th grade  Occupational History   Occupation: Retired  Tobacco Use   Smoking status: Former    Packs/day: 1.50    Years: 50.00    Total pack years: 75.00    Types: Cigarettes    Quit date: 09/2021    Years since quitting: 1.0   Smokeless tobacco: Never  Vaping Use   Vaping Use: Never used  Substance and Sexual Activity   Alcohol use: Not Currently   Drug use: Not Currently   Sexual activity: Not Currently  Other Topics Concern   Not on file  Social History Narrative   October 2022 stopped   Social Determinants of Health   Financial Resource Strain: Low Risk  (11/26/2021)   Overall Financial Resource Strain (CARDIA)    Difficulty of Paying Living Expenses: Not hard at all  Food Insecurity: No Food Insecurity (11/26/2021)   Hunger Vital Sign    Worried About Running Out of Food in the Last Year: Never true    [place] in the Last Year: Never true  Transportation Needs: No Transportation Needs (11/26/2021)   PRAPARE - Hydrologist (Medical):  No    Lack of Transportation (Non-Medical): No  Physical Activity: Insufficiently Active (11/26/2021)   Exercise Vital Sign    Days of Exercise per Week: 2 days    Minutes of Exercise per Session: 60 min  Stress: Stress Concern Present (11/26/2021)   [place]    Feeling of Stress : To some extent  Social Connections: Moderately Isolated (11/26/2021)   Social Connection and Isolation Panel [NHANES]    Frequency of Communication with Friends and Family: More than three times a week    Frequency of Social Gatherings with Friends and Family: More than three times a week    Attends Religious Services: Never    Marine scientist or Organizations: No    Attends Archivist Meetings: Never    Marital Status: Married  Human resources officer Violence: Not At Risk (11/26/2021)   Humiliation, Afraid, Rape, and Kick questionnaire    Fear of Current or Ex-Partner: No    Emotionally Abused: No    Physically Abused: No  Sexually Abused: No    Family History  Problem Relation Age of Onset   COPD Mother    Cancer Mother    Colon cancer Neg Hx    Breast cancer Neg Hx    Ovarian cancer Neg Hx    Endometrial cancer Neg Hx    Prostate cancer Neg Hx    Pancreatic cancer Neg Hx      Current Outpatient Medications:    ALPRAZolam (XANAX) 0.25 MG tablet, TAKE 1 TABLET BY MOUTH AT BEDTIME, Disp: 30 tablet, Rfl: 1   amLODipine (NORVASC) 5 MG tablet, Take 1 tablet (5 mg total) by mouth daily., Disp: 90 tablet, Rfl: 1   citalopram (CELEXA) 10 MG tablet, Take 10 mg by mouth daily., Disp: , Rfl:    clopidogrel (PLAVIX) 75 MG tablet, Take 75 mg by mouth daily., Disp: , Rfl:    ELIQUIS 5 MG TABS tablet, TAKE 1 TABLET(5 MG) BY MOUTH TWICE DAILY, Disp: 60 tablet, Rfl: 4   losartan (COZAAR) 50 MG tablet, Take 1 tablet (50 mg total) by mouth daily., Disp: 90 tablet, Rfl: 1   metoprolol succinate (TOPROL XL) 25 MG 24 hr tablet, Take 1  tablet (25 mg total) by mouth daily., Disp: 30 tablet, Rfl: 11   pantoprazole (PROTONIX) 40 MG tablet, TAKE 1 TABLET(40 MG) BY MOUTH DAILY, Disp: 90 tablet, Rfl: 1   REPATHA SURECLICK 829 MG/ML SOAJ, ADMINISTER 1 ML UNDER THE SKIN EVERY 14 DAYS, Disp: 2 mL, Rfl: 1  Physical exam:  Vitals:   10/20/22 1425  BP: (!) 148/97  Pulse: 76  Resp: 16  Temp: (!) 97.2 F (36.2 C)  SpO2: 96%  Weight: 166 lb 9.6 oz (75.6 kg)   Physical Exam Cardiovascular:     Rate and Rhythm: Normal rate and regular rhythm.     Heart sounds: Normal heart sounds.  Pulmonary:     Effort: Pulmonary effort is normal.     Breath sounds: Normal breath sounds.  Abdominal:     General: Bowel sounds are normal.     Palpations: Abdomen is soft.  Skin:    General: Skin is warm and dry.  Neurological:     Mental Status: She is alert and oriented to person, place, and time.         Latest Ref Rng & Units 10/20/2022    2:09 PM  CMP  Glucose 70 - 99 mg/dL 95   BUN 8 - 23 mg/dL 33   Creatinine 0.44 - 1.00 mg/dL 1.44   Sodium 135 - 145 mmol/L 141   Potassium 3.5 - 5.1 mmol/L 4.2   Chloride 98 - 111 mmol/L 109   CO2 22 - 32 mmol/L 25   Calcium 8.9 - 10.3 mg/dL 9.3   Total Protein 6.5 - 8.1 g/dL 6.9   Total Bilirubin 0.3 - 1.2 mg/dL 0.5   Alkaline Phos 38 - 126 U/L 70   AST 15 - 41 U/L 19   ALT 0 - 44 U/L 19       Latest Ref Rng & Units 10/20/2022    2:09 PM  CBC  WBC 4.0 - 10.5 K/uL 6.2   Hemoglobin 12.0 - 15.0 g/dL 11.6   Hematocrit 36.0 - 46.0 % 34.9   Platelets 150 - 400 K/uL 254      Assessment and plan- Patient is a 70 y.o. female who is here for follow-up of following issues:  Warm autoimmune hemolytic anemia: Hemolysis is resolved and she has not had any further  hemolytic episodes since she completed 4 doses of Rituxan in January 2023.  Reticulocyte count is normal.  Continue to monitor Iron deficiency anemia: S/p 2 doses of Feraheme.  Hemoglobin improved.  Iron studies pending.  Continue to  monitor History of DVT and PE: DVT was diagnosed when she had autoimmune hemolytic anemia and was started on Xarelto and subsequently switched to Eliquis for small PE that she had in February 2023.  It is unclear if the borderline serous adnexal tumor triggered a procoagulant state.  Regardless now the tumor is out and the PE was a small event back in February 2023.  Hemolysis has also resolved.  I therefore feel comfortable with patient stopping Eliquis at this time.  Patient would herself like to come off anticoagulation given her bruising.  Will check CBC ferritin and iron studies and hemolysis labs in 3 and 6 months and I will see her back in 6 months   Visit Diagnosis 1. Warm autoimmune hemolytic anemia (HCC)   2. Current use of long term anticoagulation   3. History of pulmonary embolism      Dr. Randa Evens, MD, MPH [facility] [phone] 10/20/2022 3:45 PM

## 2022-10-23 LAB — HAPTOGLOBIN: Haptoglobin: 99 mg/dL (ref 42–346)

## 2022-10-26 ENCOUNTER — Ambulatory Visit (HOSPITAL_COMMUNITY)
Admission: RE | Admit: 2022-10-26 | Discharge: 2022-10-26 | Disposition: A | Discharge status: Home / Self Care | Payer: Medicare Other | Source: Ambulatory Visit | Attending: Neurology | Admitting: Neurology

## 2022-10-26 DIAGNOSIS — Z8673 Personal history of transient ischemic attack (TIA), and cerebral infarction without residual deficits: Secondary | ICD-10-CM | POA: Diagnosis present

## 2022-10-28 ENCOUNTER — Telehealth: Payer: Self-pay | Admitting: *Deleted

## 2022-10-28 NOTE — Telephone Encounter (Signed)
Patient called and scheduled a follow up appt with Dr Berline Lopes for 2/8

## 2022-11-06 NOTE — Progress Notes (Signed)
Kindly inform the patient that carotid ultrasound study shows no significant narrowing of either carotid artery in the neck

## 2022-11-06 NOTE — Progress Notes (Signed)
Kindly inform the patient that transcranial Doppler study of the blood vessels in the brain was normal and does not suggest any major blockages to worry about

## 2022-11-21 ENCOUNTER — Other Ambulatory Visit: Payer: Self-pay | Admitting: Neurology

## 2022-12-06 ENCOUNTER — Telehealth: Payer: Self-pay | Admitting: Neurology

## 2022-12-06 NOTE — Telephone Encounter (Signed)
Andrea Williamson/ [employer] is calling. Stated he has sent to a PA form for REPATHA SURECLICK 425 MG/ML SOAJ. Stated he was just calling to verify the form was received and to ask that it be filled out quickly as possible.

## 2022-12-06 NOTE — Telephone Encounter (Signed)
Okay I will look into this in the office, thanks

## 2022-12-06 NOTE — Telephone Encounter (Signed)
It looks like Dr. Krista Blue originally sent the RX in and I am assuming the insurance/HTA would send any required forms to the office  and not to [place]. I am not sure how I can help you with this.

## 2022-12-08 NOTE — Telephone Encounter (Signed)
P.A. Dept called stating that the fax keeps on coming back as failed. They were given a 3rd fax number directly to the pod to try. Please call [phone] if form is still not received.

## 2022-12-08 NOTE — Telephone Encounter (Signed)
After calling the number from the note, and speaking to the PA team, they were unable to find paperwork on this patient and after giving them a member ID, they told me to go through [employer] as that is what is on back of card. So please get specific contact info if try to reach out again.

## 2022-12-09 ENCOUNTER — Telehealth: Payer: Self-pay | Admitting: Cardiology

## 2022-12-09 ENCOUNTER — Other Ambulatory Visit: Payer: Self-pay

## 2022-12-09 MED ORDER — LOSARTAN POTASSIUM 50 MG PO TABS: 50.0000 mg | ORAL_TABLET | Freq: Every day | ORAL | 0 refills | Status: AC

## 2022-12-09 NOTE — Telephone Encounter (Signed)
Called patient to inform her that a request was sent earlier from the pharmacy  and the RX was sent to the pharmacy. Patient verbalized understanding.

## 2022-12-09 NOTE — Telephone Encounter (Signed)
*  STAT* If patient is at the pharmacy, call can be transferred to refill team.   1. Which medications need to be refilled? (please list name of each medication and dose if known) losartan (COZAAR) 50 MG tablet   2. Which pharmacy/location (including street and city if local pharmacy) is medication to be sent to? [facility] [place], [place]   3. Do they need a 30 day or 90 day supply? [place]

## 2022-12-10 ENCOUNTER — Encounter: Payer: Self-pay | Admitting: Oncology

## 2022-12-14 NOTE — Telephone Encounter (Signed)
We received denial back on Repatha. Pt did not qualify under medical necessity.

## 2022-12-17 ENCOUNTER — Encounter: Payer: Self-pay | Admitting: Oncology

## 2022-12-17 ENCOUNTER — Ambulatory Visit: Payer: PPO | Attending: Cardiology | Admitting: Cardiology

## 2022-12-17 ENCOUNTER — Encounter: Payer: Self-pay | Admitting: Cardiology

## 2022-12-17 VITALS — BP 138/84 | HR 60 | Ht 62.0 in | Wt 169.0 lb

## 2022-12-17 DIAGNOSIS — E7800 Pure hypercholesterolemia, unspecified: Secondary | ICD-10-CM

## 2022-12-17 DIAGNOSIS — I10 Essential (primary) hypertension: Secondary | ICD-10-CM

## 2022-12-17 DIAGNOSIS — I2510 Atherosclerotic heart disease of native coronary artery without angina pectoris: Secondary | ICD-10-CM | POA: Diagnosis not present

## 2022-12-17 NOTE — Progress Notes (Signed)
Cardiology Office Note:    Date:  12/17/2022   ID:  Andrea Williamson, DOB 1951-08-15, MRN [id number]  PCP:  Derinda Late, MD   [facility] Providers Cardiologist:  Kate Sable, MD     Referring MD: Derinda Late, MD   Chief Complaint  Patient presents with   Follow-up    3-4 month HTN f/u, no new cardiac concerns     History of Present Illness:    Andrea Williamson is a 72 y.o. female with a hx of CAD (coronary calcification on chest CT),  hypertension, former smoker x50+ years, CVA s/p thrombectomy and left A2 stent 09/2021, bilateral DVT, hemolytic anemia presenting for follow-up.  Recently lost her husband, currently grieving.  Otherwise feels well, denies chest pain.  Takes medications as prescribed.  Blood pressures overall seem to have improved.  Does not have much help/family around her.  Prior notes Echo 12/2021 EF 65 to 70% Echocardiogram 09/2021 EF 65-70, impaired relaxation.  Moderate LA dilatation. 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  She had a stroke in November 2022, started on aspirin and Brilinta.  Subsequently diagnosed with bilateral BK DVT, Eliquis was started.  Being followed by hematology and neurology.  Stopped smoking after stroke.  Past Medical History:  Diagnosis Date   Atherosclerosis    Deep vein thrombosis (DVT) ([place])    Hemolytic anemia ([place])    Hypertension    Stroke [facility])     Past Surgical History:  Procedure Laterality Date   ANKLE SURGERY Left 2008   IR CT HEAD LTD  10/05/2021   IR INTRA CRAN STENT  10/01/2021   IR PERCUTANEOUS ART THROMBECTOMY/INFUSION INTRACRANIAL INC DIAG ANGIO  10/01/2021   IR US GUIDE VASC ACCESS RIGHT  10/01/2021   IVC FILTER INSERTION N/A 01/06/2022   Procedure: IVC FILTER INSERTION;  Surgeon: Algernon Huxley, MD;  Location: [place] [facility];  Service: Cardiovascular;  Laterality: N/A;   RADIOLOGY WITH ANESTHESIA N/A  10/01/2021   Procedure: IR WITH ANESTHESIA;  Surgeon: Radiologist, Medication, MD;  Location: [place];  Service: Radiology;  Laterality: N/A;   ROBOTIC ASSISTED BILATERAL SALPINGO OOPHERECTOMY Bilateral 06/09/2022   Procedure: XI ROBOTIC ASSISTED BILATERAL SALPINGO OOPHORECTOMY, STAGING INCLUDING TOTAL HYSTERECTOMY, OMENTECTOMY PERITONEAL BIOPSIES;  Surgeon: [name], MD;  Location: [facility];  Service: Gynecology;  Laterality: Bilateral;    Current Medications: Current Meds  Medication Sig   ALPRAZolam (XANAX) 0.25 MG tablet TAKE 1 TABLET BY MOUTH AT BEDTIME   amLODipine (NORVASC) 5 MG tablet Take 1 tablet (5 mg total) by mouth daily.   citalopram (CELEXA) 10 MG tablet Take 10 mg by mouth daily.   clopidogrel (PLAVIX) 75 MG tablet Take 75 mg by mouth daily.   ELIQUIS 5 MG TABS tablet TAKE 1 TABLET(5 MG) BY MOUTH TWICE DAILY   losartan (COZAAR) 50 MG tablet Take 1 tablet (50 mg total) by mouth daily.   metoprolol succinate (TOPROL XL) 25 MG 24 hr tablet Take 1 tablet (25 mg total) by mouth daily.   pantoprazole (PROTONIX) 40 MG tablet TAKE 1 TABLET(40 MG) BY MOUTH DAILY   REPATHA SURECLICK 086 MG/ML SOAJ ADMINISTER 1 ML UNDER THE SKIN EVERY 14 DAYS     Allergies:   Patient has no known allergies.   Social History   Socioeconomic History   Marital status: Married    Spouse name: Not on file   Number of children: 3  Years of education: 16   Highest education level: 12th grade  Occupational History   Occupation: Retired  Tobacco Use   Smoking status: Former    Packs/day: 1.50    Years: 50.00    Total pack years: 75.00    Types: Cigarettes    Quit date: 09/2021    Years since quitting: 1.2   Smokeless tobacco: Never  Vaping Use   Vaping Use: Never used  Substance and Sexual Activity   Alcohol use: Not Currently   Drug use: Not Currently   Sexual activity: Not Currently  Other Topics Concern   Not on file  Social History Narrative   October 2022 stopped   Social  Determinants of Health   Financial Resource Strain: Low Risk  (11/26/2021)   Overall Financial Resource Strain (CARDIA)    Difficulty of Paying Living Expenses: Not hard at all  Food Insecurity: No Food Insecurity (11/26/2021)   Hunger Vital Sign    Worried About Running Out of Food in the Last Year: Never true    [place] in the Last Year: Never true  Transportation Needs: No Transportation Needs (11/26/2021)   PRAPARE - Hydrologist (Medical): No    Lack of Transportation (Non-Medical): No  Physical Activity: Insufficiently Active (11/26/2021)   Exercise Vital Sign    Days of Exercise per Week: 2 days    Minutes of Exercise per Session: 60 min  Stress: Stress Concern Present (11/26/2021)   [place]    Feeling of Stress : To some extent  Social Connections: Moderately Isolated (11/26/2021)   Social Connection and Isolation Panel [NHANES]    Frequency of Communication with Friends and Family: More than three times a week    Frequency of Social Gatherings with Friends and Family: More than three times a week    Attends Religious Services: Never    Marine scientist or Organizations: No    Attends Music therapist: Never    Marital Status: Married     Family History: The patient's family history includes COPD in her mother; Cancer in her mother. There is no history of Colon cancer, Breast cancer, Ovarian cancer, Endometrial cancer, Prostate cancer, or Pancreatic cancer.  ROS:   Please see the history of present illness.     All other systems reviewed and are negative.  EKGs/Labs/Other Studies Reviewed:    The following studies were reviewed today:   EKG:  EKG not ordered today.   Recent Labs: 01/06/2022: TSH 1.613 01/13/2022: Magnesium 1.9 10/20/2022: ALT 19; BUN 33; Creatinine, Ser 1.44; Hemoglobin 11.6; Platelets 254; Potassium 4.2; Sodium 141  Recent Lipid  Panel    Component Value Date/Time   CHOL 182 06/15/2022 0837   TRIG 198 (H) 06/15/2022 0837   HDL 65 06/15/2022 0837   CHOLHDL 2.8 06/15/2022 0837   VLDL 40 06/15/2022 0837   LDLCALC 77 06/15/2022 0837     Risk Assessment/Calculations:          Physical Exam:    VS:  BP 138/84 (BP Location: Left Arm, Patient Position: Sitting, Cuff Size: Normal)   Pulse 60   Ht '5\' 2"'$  (1.575 m)   Wt 169 lb (76.7 kg)   SpO2 98%   BMI 30.91 kg/m     Wt Readings from Last 3 Encounters:  12/17/22 169 lb (76.7 kg)  10/20/22 166 lb 9.6 oz (75.6 kg)  10/14/22 164 lb (74.4  kg)     GEN:  Well nourished, well developed in no acute distress HEENT: Normal NECK: No JVD; No carotid bruits CARDIAC: RRR, no murmurs, rubs, gallops RESPIRATORY:  Clear to auscultation without rales, wheezing or rhonchi  ABDOMEN: Soft, non-tender, non-distended MUSCULOSKELETAL:  No edema; No deformity  SKIN: Warm and dry NEUROLOGIC:  Alert and oriented x 3 PSYCHIATRIC:  Normal affect   ASSESSMENT:    1. Coronary artery disease involving native coronary artery of native heart without angina pectoris   2. Primary hypertension   3. Pure hypercholesterolemia    PLAN:    In order of problems listed above:  Coronary calcification, on Eliquis for DVT.  Continue Repatha.  EF 65%. Hypertension, BP much improved today.  Continue losartan, Norvasc, Toprol-XL. Hyperlipidemia, cholesterol controlled.  Continue Repatha.  Follow-up in 1 year.     Medication Adjustments/Labs and Tests Ordered: Current medicines are reviewed at length with the patient today.  Concerns regarding medicines are outlined above.  No orders of the defined types were placed in this encounter.  No orders of the defined types were placed in this encounter.   There are no Patient Instructions on file for this visit.   Signed, Kate Sable, MD  12/17/2022 11:30 AM    [place]

## 2022-12-17 NOTE — Patient Instructions (Signed)
Medication Instructions:   Your physician recommends that you continue on your current medications as directed. Please refer to the Current Medication list given to you today.  *If you need a refill on your cardiac medications before your next appointment, please call your pharmacy*   Lab Work:  None Ordered  If you have labs (blood work) drawn today and your tests are completely normal, you will receive your results only by: MyChart Message (if you have MyChart) OR A paper copy in the mail If you have any lab test that is abnormal or we need to change your treatment, we will call you to review the results.   Testing/Procedures:  None Ordered    Follow-Up: At [facility], you and your health needs are our priority.  As part of our continuing mission to provide you with exceptional heart care, we have created designated Provider Care Teams.  These Care Teams include your primary Cardiologist (physician) and Advanced Practice Providers (APPs -  Physician Assistants and Nurse Practitioners) who all work together to provide you with the care you need, when you need it.  We recommend signing up for the patient portal called "MyChart".  Sign up information is provided on this After Visit Summary.  MyChart is used to connect with patients for Virtual Visits (Telemedicine).  Patients are able to view lab/test results, encounter notes, upcoming appointments, etc.  Non-urgent messages can be sent to your provider as well.   To learn more about what you can do with MyChart, go to [email or web address].    Your next appointment:   12 month(s)  Provider:   You may see Brian Agbor-Etang, MD or one of the following Advanced Practice Providers on your designated Care Team:   Christopher Berge, NP Ryan Dunn, PA-C Cadence Furth, PA-C Sheri Hammock, NP  

## 2022-12-20 DIAGNOSIS — E7800 Pure hypercholesterolemia, unspecified: Secondary | ICD-10-CM | POA: Diagnosis not present

## 2022-12-20 DIAGNOSIS — N1831 Chronic kidney disease, stage 3a: Secondary | ICD-10-CM | POA: Diagnosis not present

## 2022-12-23 ENCOUNTER — Encounter: Payer: Self-pay | Admitting: Gynecologic Oncology

## 2022-12-23 ENCOUNTER — Inpatient Hospital Stay: Payer: PPO | Attending: Oncology | Admitting: Gynecologic Oncology

## 2022-12-23 ENCOUNTER — Other Ambulatory Visit: Payer: Self-pay

## 2022-12-23 VITALS — BP 164/71 | HR 62 | Temp 97.8°F | Resp 16 | Wt 163.0 lb

## 2022-12-23 DIAGNOSIS — D3911 Neoplasm of uncertain behavior of right ovary: Secondary | ICD-10-CM | POA: Diagnosis not present

## 2022-12-23 DIAGNOSIS — Z90710 Acquired absence of both cervix and uterus: Secondary | ICD-10-CM | POA: Insufficient documentation

## 2022-12-23 DIAGNOSIS — Z90722 Acquired absence of ovaries, bilateral: Secondary | ICD-10-CM | POA: Insufficient documentation

## 2022-12-23 NOTE — Progress Notes (Signed)
Gynecologic Oncology Return Clinic Visit  12/23/22  Reason for Visit: surveillance in the setting of borderline ovarian tumor  Treatment History: Andrea Williamson is a 72 year old female with a medical history that includes HTN, CVA s/p left A2 thrombectomy with stent placement 10/01/2021 who presented to the [facility] ER with symptoms of nausea, weakness, dizziness for the past week. Ct imaging of the head was negative for acute intracranial abnormality with small chronic appearing left ACA infarcts. 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  CT AP with contrast on 11/13/2021 revealed a solid and cystic right adnexal mass measuring 4.9 cms, non specific stranding in the RUQ of the abd with associated sub-centimeter lymph nodes. 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   To further evaluate the ovarian mass, she underwent a pelvic/transvaginal ultrasound with the impression showing a complex lesion in the right adnexa with mostly cystic echogenicity along with a lobulated solid nodule measuring 1.4 cm in size with demonstrable internal vascularity. Possibility of malignant neoplastic process in the right ovary is not excluded. 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 Left ovary is not sonographically visualized.   Ca1 25 was normal at 21.5.   Pelvic MRI was performed on 1/4 showing a complex solid and cystic 2.9 x 3 cm right ovarian mass with T2 hyperintensity and T1 hyperintensity.  Anteriorly there is a 1 cm enhancing soft tissue component which is corresponds to the ultrasound area of vascularity.   Pelvic MRI was performed on 02/17/2022 which showed stable 3.2 cm complex cystic mass involving the right ovary with 1 cm solid mural nodule, highly suspicious for cystic ovarian neoplasm.   06/09/2022: Total robotic hysterectomy with BSO, staging  including omentectomy and peritoneal biopsies.  Frozen section consistent with at least borderline tumor.  Pathology revealed a serous borderline tumor, mixed conventional and micropapillary, of the right ovary.  Cytology positive.  Interval History: Patient reports overall doing well physically.  She denies any abdominal or pelvic pain.  She has gained some weight so is now working on losing weight.  Has cut out sugary drinks and is eating more vegetables.  Reports regular bowel function, denies any urinary symptoms.  Denies any vaginal bleeding or discharge.  Her husband battled with terminal cancer for about 6 years, recently died in 10/03/2023 of last year.  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[place])    Hypertension    Stroke [facility])     Past Surgical History:  Procedure Laterality Date   ANKLE SURGERY Left 2008   IR CT HEAD LTD  10/05/2021   IR INTRA CRAN STENT  10/01/2021   IR PERCUTANEOUS ART THROMBECTOMY/INFUSION INTRACRANIAL INC DIAG ANGIO  10/01/2021   IR US GUIDE VASC ACCESS RIGHT  10/01/2021   IVC FILTER INSERTION N/A 01/06/2022   Procedure: IVC FILTER INSERTION;  Surgeon: Algernon Huxley, MD;  Location: [place] [facility];  Service: Cardiovascular;  Laterality: N/A;   RADIOLOGY WITH ANESTHESIA N/A 10/01/2021   Procedure: IR WITH ANESTHESIA;  Surgeon: Radiologist, Medication, MD;  Location: [place];  Service: Radiology;  Laterality: N/A;   ROBOTIC ASSISTED BILATERAL SALPINGO OOPHERECTOMY Bilateral 06/09/2022   Procedure: XI ROBOTIC ASSISTED BILATERAL SALPINGO OOPHORECTOMY, STAGING INCLUDING TOTAL HYSTERECTOMY, OMENTECTOMY PERITONEAL BIOPSIES;  Surgeon: [name], MD;  Location: [facility];  Service: Gynecology;  Laterality: Bilateral;    Family History  Problem Relation Age of Onset   COPD Mother    Cancer Mother    Colon cancer Neg Hx    Breast cancer Neg Hx     Ovarian cancer Neg Hx    Endometrial cancer Neg Hx    Prostate cancer Neg Hx    Pancreatic cancer Neg Hx     Social History   Socioeconomic History   Marital status: Married    Spouse name: Not on file   Number of children: 3   Years of education: 12   Highest education level: 12th grade  Occupational History   Occupation: Retired  Tobacco Use   Smoking status: Former    Packs/day: 1.50    Years: 50.00    Total pack years: 75.00    Types: Cigarettes    Quit date: 09/2021    Years since quitting: 1.2   Smokeless tobacco: Never  Vaping Use   Vaping Use: Never used  Substance and Sexual Activity   Alcohol use: Not Currently   Drug use: Not Currently   Sexual activity: Not Currently  Other Topics Concern   Not on file  Social History Narrative   October 2022 stopped   Social Determinants of Health   Financial Resource Strain: Low Risk  (11/26/2021)   Overall Financial Resource Strain (CARDIA)    Difficulty of Paying Living Expenses: Not hard at all  Food Insecurity: No Food Insecurity (11/26/2021)   Hunger Vital Sign    Worried About Running Out of Food in the Last Year: Never true    [place] in the Last Year: Never true  Transportation Needs: No Transportation Needs (11/26/2021)   PRAPARE - Hydrologist (Medical): No    Lack of Transportation (Non-Medical): No  Physical Activity: Insufficiently Active (11/26/2021)   Exercise Vital Sign    Days of Exercise per Week: 2 days    Minutes of Exercise per Session: 60 min  Stress: Stress Concern Present (11/26/2021)   [place]    Feeling of Stress : To some extent  Social Connections: Moderately Isolated (11/26/2021)   Social Connection and Isolation Panel [NHANES]    Frequency of Communication with Friends and Family: More than three times a week    Frequency of Social Gatherings with Friends and Family: More than three  times a week    Attends Religious Services: Never    Marine scientist or Organizations: No    Attends Archivist Meetings: Never    Marital Status: Married    Current Medications:  Current Outpatient Medications:    ALPRAZolam (XANAX) 0.25 MG tablet, TAKE 1 TABLET BY MOUTH AT BEDTIME, Disp: 30 tablet, Rfl: 1   amLODipine (NORVASC) 5 MG tablet, Take 1 tablet (5 mg total) by mouth daily., Disp: 90 tablet, Rfl: 1   citalopram (CELEXA) 10 MG tablet, Take 10 mg by mouth daily., Disp: , Rfl:    clopidogrel (PLAVIX) 75 MG tablet, Take 75 mg by mouth daily., Disp: , Rfl:    losartan (COZAAR) 50 MG tablet, Take 1 tablet (50 mg total) by mouth daily., Disp: 90 tablet, Rfl: 0   metoprolol succinate (TOPROL XL) 25 MG 24 hr tablet, Take 1 tablet (25 mg total) by mouth daily., Disp: 30 tablet, Rfl: 11   pantoprazole (PROTONIX) 40  MG tablet, TAKE 1 TABLET(40 MG) BY MOUTH DAILY, Disp: 90 tablet, Rfl: 1   REPATHA SURECLICK 258 MG/ML SOAJ, ADMINISTER 1 ML UNDER THE SKIN EVERY 14 DAYS, Disp: 2 mL, Rfl: 1  Review of Systems: + weight gain Denies appetite changes, fevers, chills, fatigue. Denies hearing loss, neck lumps or masses, mouth sores, ringing in ears or voice changes. Denies cough or wheezing.  Denies shortness of breath. Denies chest pain or palpitations. Denies leg swelling. Denies abdominal distention, pain, blood in stools, constipation, diarrhea, nausea, vomiting, or early satiety. Denies pain with intercourse, dysuria, frequency, hematuria or incontinence. Denies hot flashes, pelvic pain, vaginal bleeding or vaginal discharge.   Denies joint pain, back pain or muscle pain/cramps. Denies itching, rash, or wounds. Denies dizziness, headaches, numbness or seizures. Denies swollen lymph nodes or glands, denies easy bruising or bleeding. Denies anxiety, depression, confusion, or decreased concentration.  Physical Exam: BP (!) 164/71 (BP Location: Right Arm, Patient Position:  Sitting) Comment: Informed Nurse Jess, manual recheck 146/88 KJ CMA  Pulse 62   Temp 97.8 F (36.6 C) (Oral)   Resp 16   Wt 163 lb (73.9 kg) Comment: manual recheck 146/88  SpO2 95%   BMI 29.81 kg/m  General: Alert, oriented, no acute distress. HEENT: Normocephalic, atraumatic, sclera anicteric. Chest: Unlabored breathing on room air.  Lungs clear to auscultation bilaterally, no wheezes or rhonchi. Cardiovascular: Regular rate and rhythm, no murmurs or rubs. Abdomen: soft, nontender.  Normoactive bowel sounds.  No masses or hepatosplenomegaly appreciated.  Well-healed incisions, remaining Dermabond removed.  Mattress sutures excised from left lateral incision. Extremities: Grossly normal range of motion.  Warm, well perfused.  No edema bilaterally. Skin: No rashes or lesions noted. GU: Normal appearing external genitalia without erythema, excoriation, or lesions.  Speculum exam reveals cuff intact, no masses noted.  On bimanual exam, cuff is smooth, no nodularity or masses.  This is confirmed on rectovaginal exam.  Laboratory & Radiologic Studies:    Latest Ref Rng & Units 10/20/2022    2:09 PM 09/06/2022    9:44 AM 06/03/2022    2:25 PM  CBC  WBC 4.0 - 10.5 K/uL 6.2  4.7  5.8   Hemoglobin 12.0 - 15.0 g/dL 11.6  9.9  11.4   Hematocrit 36.0 - 46.0 % 34.9  30.9  35.6   Platelets 150 - 400 K/uL 254  268  276       Latest Ref Rng & Units 10/20/2022    2:09 PM 06/03/2022    2:25 PM 05/25/2022    2:27 PM  BMP  Glucose 70 - 99 mg/dL 95  87  89   BUN 8 - 23 mg/dL 33  28  29   Creatinine 0.44 - 1.00 mg/dL 1.44  1.21  1.83   Sodium 135 - 145 mmol/L 141  144  138   Potassium 3.5 - 5.1 mmol/L 4.2  4.1  4.2   Chloride 98 - 111 mmol/L 109  113  107   CO2 22 - 32 mmol/L '25  24  23   '$ Calcium 8.9 - 10.3 mg/dL 9.3  9.6  9.5    Assessment & Plan: Andrea Williamson is a 72 y.o. 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  Status post staging surgery on  06/16/2022.   The patient is doing well without evidence of disease recurrence.  Sympathy expressed for her husband's recent passing.  We had previously discussed option of surveillance visits  every 3-6 months.  We opted to proceed with surveillance visits every 6 months initially and will plan to transition to yearly visits in the future.  Today, we reviewed signs and symptoms that should prompt a phone call before her next scheduled visit.   CA-125 was not a tumor marker.  Her blood pressure was initially elevated when she presented to clinic.  She attributed this to talking about the recent death of her husband.  She denies any symptoms including headache, vision changes. Recheck at the end of our visit was 146/88.  I encouraged the patient to reach out to her primary care provider.  20 minutes of total time was spent for this patient encounter, including preparation, face-to-face counseling with the patient and coordination of care, and documentation of the encounter.  Jeral Pinch, MD  Division of Gynecologic Oncology  Department of Obstetrics and Gynecology  [facility]

## 2022-12-23 NOTE — Patient Instructions (Signed)
It was good to see you today.  I do not see or feel any evidence of cancer recurrence on your exam.  I will see you for follow-up in 6 months.  Please call the clinic in June or July to schedule a visit to see me in August.  As always, if you develop any new and concerning symptoms before your next visit, please call to see me sooner.

## 2022-12-27 ENCOUNTER — Telehealth: Payer: Self-pay | Admitting: Neurology

## 2022-12-27 DIAGNOSIS — Z1331 Encounter for screening for depression: Secondary | ICD-10-CM | POA: Diagnosis not present

## 2022-12-27 DIAGNOSIS — Z0000 Encounter for general adult medical examination without abnormal findings: Secondary | ICD-10-CM | POA: Diagnosis not present

## 2022-12-27 NOTE — Telephone Encounter (Signed)
Information faxed back as requested.

## 2022-12-27 NOTE — Telephone Encounter (Signed)
SarahBeth @ [employer] is asking for a call re: Lab parameters , she is asking for most recent LDL [id number] SURECLICK [id number] SOAJ she has also provided their fax#[phone]

## 2022-12-28 ENCOUNTER — Encounter (INDEPENDENT_AMBULATORY_CARE_PROVIDER_SITE_OTHER): Payer: Self-pay | Admitting: Vascular Surgery

## 2022-12-28 ENCOUNTER — Ambulatory Visit (INDEPENDENT_AMBULATORY_CARE_PROVIDER_SITE_OTHER): Payer: PPO | Admitting: Vascular Surgery

## 2022-12-28 VITALS — BP 118/73 | HR 62 | Ht 62.0 in | Wt 167.0 lb

## 2022-12-28 DIAGNOSIS — I82503 Chronic embolism and thrombosis of unspecified deep veins of lower extremity, bilateral: Secondary | ICD-10-CM

## 2022-12-28 DIAGNOSIS — I10 Essential (primary) hypertension: Secondary | ICD-10-CM

## 2022-12-28 DIAGNOSIS — D5911 Warm autoimmune hemolytic anemia: Secondary | ICD-10-CM | POA: Diagnosis not present

## 2022-12-28 NOTE — Progress Notes (Signed)
Patient ID: Andrea Williamson, female   DOB: 1951-05-22, 72 y.o.   MRN: [id number]  No chief complaint on file.   HPI Andrea Williamson is a 72 y.o. female.  I am asked to see the patient by *** for evaluation of ***.     Past Medical History:  Diagnosis Date   Atherosclerosis    Deep vein thrombosis (DVT) ([facility])    Hemolytic anemia ([facility])    Hypertension    Stroke [facility])     Past Surgical History:  Procedure Laterality Date   ANKLE SURGERY Left 2008   IR CT HEAD LTD  10/05/2021   IR INTRA CRAN STENT  10/01/2021   IR PERCUTANEOUS ART THROMBECTOMY/INFUSION INTRACRANIAL INC DIAG ANGIO  10/01/2021   IR US GUIDE VASC ACCESS RIGHT  10/01/2021   IVC FILTER INSERTION N/A 01/06/2022   Procedure: IVC FILTER INSERTION;  Surgeon: Annice Needy, MD;  Location: [facility] INV[facility]IVE [facility];  Service: Cardiovascular;  Laterality: N/A;   RADIOLOGY WITH ANESTHESIA N/A 10/01/2021   Procedure: IR WITH ANESTHESIA;  Surgeon: Radiologist, Medication, MD;  Location: [facility];  Service: Radiology;  Laterality: N/A;   ROBOTIC ASSISTED BILATERAL SALPINGO OOPHERECTOMY Bilateral 06/09/2022   Procedure: XI ROBOTIC ASSISTED BILATERAL SALPINGO OOPHORECTOMY, STAGING INCLUDING TOTAL HYSTERECTOMY, OMENTECTOMY PERITONEAL BIOPSIES;  Surgeon: Carver Fila, MD;  Location: [facility];  Service: Gynecology;  Laterality: Bilateral;     Family History  Problem Relation Age of Onset   COPD Mother    Cancer Mother    Colon cancer Neg Hx    Breast cancer Neg Hx    Ovarian cancer Neg Hx    Endometrial cancer Neg Hx    Prostate cancer Neg Hx    Pancreatic cancer Neg Hx    ***   Social History   Tobacco Use   Smoking status: Former    Packs/day: 1.50    Years: 50.00    Total pack years: 75.00    Types: Cigarettes    Quit date: 09/2021    Years since quitting: 1.2   Smokeless tobacco: Never  Vaping Use   Vaping Use: Never used  Substance Use Topics   Alcohol use: Not Currently   Drug use: Not Currently   ***  No Known  Allergies  Current Outpatient Medications  Medication Sig Dispense Refill   ALPRAZolam (XANAX) 0.25 MG tablet TAKE 1 TABLET BY MOUTH AT BEDTIME 30 tablet 1   amLODipine (NORVASC) 5 MG tablet Take 1 tablet (5 mg total) by mouth daily. 90 tablet 1   citalopram (CELEXA) 10 MG tablet Take 10 mg by mouth daily.     clopidogrel (PLAVIX) 75 MG tablet Take 75 mg by mouth daily.     losartan (COZAAR) 50 MG tablet Take 1 tablet (50 mg total) by mouth daily. 90 tablet 0   metoprolol succinate (TOPROL XL) 25 MG 24 hr tablet Take 1 tablet (25 mg total) by mouth daily. 30 tablet 11   pantoprazole (PROTONIX) 40 MG tablet TAKE 1 TABLET(40 MG) BY MOUTH DAILY 90 tablet 1   REPATHA SURECLICK 140 MG/ML SOAJ ADMINISTER 1 ML UNDER THE SKIN EVERY 14 DAYS 2 mL 1   No current facility-administered medications for this visit.      REVIEW OF SYSTEMS (Negative unless checked)  Constitutional: [] Weight loss  [] Fever  [] Chills Cardiac: [] Chest pain   [] Chest pressure   [] Palpitations   [] Shortness of breath when laying flat   [] Shortness of breath at rest   []   Shortness of breath with exertion. Vascular:  [] Pain in legs with walking   [] Pain in legs at rest   [] Pain in legs when laying flat   [] Claudication   [] Pain in feet when walking  [] Pain in feet at rest  [] Pain in feet when laying flat   [] History of DVT   [] Phlebitis   [] Swelling in legs   [] Varicose veins   [] Non-healing ulcers Pulmonary:   [] Uses home oxygen   [] Productive cough   [] Hemoptysis   [] Wheeze  [] COPD   [] Asthma Neurologic:  [] Dizziness  [] Blackouts   [] Seizures   [] History of stroke   [] History of TIA  [] Aphasia   [] Temporary blindness   [] Dysphagia   [] Weakness or numbness in arms   [] Weakness or numbness in legs Musculoskeletal:  [] Arthritis   [] Joint swelling   [] Joint pain   [] Low back pain Hematologic:  [] Easy bruising  [] Easy bleeding   [] Hypercoagulable state   [] Anemic  [] Hepatitis Gastrointestinal:  [] Blood in stool   [] Vomiting blood   [] Gastroesophageal reflux/heartburn   [] Abdominal pain Genitourinary:  [] Chronic kidney disease   [] Difficult urination  [] Frequent urination  [] Burning with urination   [] Hematuria Skin:  [] Rashes   [] Ulcers   [] Wounds Psychological:  [] History of anxiety   []  History of major depression.    Physical Exam There were no vitals taken for this visit. Gen:  WD/WN, NAD Head: [place]/AT, No temporalis wasting. Prominent temp pulse not noted. Ear/Nose/Throat: Hearing grossly intact, nares w/o erythema or drainage, oropharynx w/o Erythema/Exudate Eyes: Conjunctiva clear, sclera non-icteric  Neck: trachea midline.  No JVD.  Pulmonary:  Good air movement, respirations not labored, no use of accessory muscles  Cardiac: RRR, no JVD Vascular: *** Vessel Right Left  Radial Palpable Palpable                                   Gastrointestinal:. No masses, surgical incisions, or scars. Musculoskeletal: M/S 5/5 throughout.  Extremities without ischemic changes.  No deformity or atrophy. *** edema. Neurologic: Sensation grossly intact in extremities.  Symmetrical.  Speech is fluent. Motor exam as listed above. Psychiatric: Judgment intact, Mood & affect appropriate for pt's clinical situation. Dermatologic: No rashes or ulcers noted.  No cellulitis or open wounds.    Radiology No results found.  Labs Recent Results (from the past 2160 hour(s))  Haptoglobin     Status: None   Collection Time: 10/20/22  2:09 PM  Result Value Ref Range   Haptoglobin 99 42 - 346 mg/dL    Comment: (NOTE) Performed At: [facility] [address] [place], [place] [id number] Jolene Schimke MD [id number]   Reticulocytes     Status: Abnormal   Collection Time: 10/20/22  2:09 PM  Result Value Ref Range   Retic Ct Pct 2.9 0.4 - 3.1 %   RBC. 3.57 (L) 3.87 - 5.11 MIL/uL   Retic Count, Absolute 103.2 19.0 - 186.0 K/uL   Immature Retic Fract 18.1 (H) 2.3 - 15.9 %    Comment: Performed at [facility], [address] [place], [place] [address]  Comprehensive metabolic panel     Status: Abnormal   Collection Time: 10/20/22  2:09 PM  Result Value Ref Range   Sodium 141 135 - 145 mmol/L   Potassium 4.2 3.5 - 5.1 mmol/L   Chloride 109 98 - 111 mmol/L   CO2 25 22 - 32 mmol/L   Glucose,  Bld 95 70 - 99 mg/dL    Comment: Glucose reference range applies only to samples taken after fasting for at least 8 hours.   BUN 33 (H) 8 - 23 mg/dL   Creatinine, Ser 1.61 (H) 0.44 - 1.00 mg/dL   Calcium 9.3 8.9 - 09.6 mg/dL   Total Protein 6.9 6.5 - 8.1 g/dL   Albumin 4.1 3.5 - 5.0 g/dL   AST 19 15 - 41 U/L   ALT 19 0 - 44 U/L   Alkaline Phosphatase 70 38 - 126 U/L   Total Bilirubin 0.5 0.3 - 1.2 mg/dL   GFR, Estimated 39 (L) >60 mL/min    Comment: (NOTE) Calculated using the CKD-EPI Creatinine Equation (2021)    Anion gap 7 5 - 15    Comment: Performed at [facility], [address] [place], [place] [address]  CBC with Differential/Platelet     Status: Abnormal   Collection Time: 10/20/22  2:09 PM  Result Value Ref Range   WBC 6.2 4.0 - 10.5 K/uL   RBC 3.59 (L) 3.87 - 5.11 MIL/uL   Hemoglobin 11.6 (L) 12.0 - 15.0 g/dL   HCT 98.1 (L) 19.1 - 47.8 %   MCV 97.2 80.0 - 100.0 fL   MCH 32.3 26.0 - 34.0 pg   MCHC 33.2 30.0 - 36.0 g/dL   RDW 29.5 62.1 - 30.8 %   Platelets 254 150 - 400 K/uL   nRBC 0.0 0.0 - 0.2 %   Neutrophils Relative % 62 %   Neutro Abs 3.9 1.7 - 7.7 K/uL   Lymphocytes Relative 24 %   Lymphs Abs 1.5 0.7 - 4.0 K/uL   Monocytes Relative 8 %   Monocytes Absolute 0.5 0.1 - 1.0 K/uL   Eosinophils Relative 4 %   Eosinophils Absolute 0.2 0.0 - 0.5 K/uL   Basophils Relative 1 %   Basophils Absolute 0.0 0.0 - 0.1 K/uL   Immature Granulocytes 1 %   Abs Immature Granulocytes 0.09 (H) 0.00 - 0.07 K/uL    Comment: Performed at [facility], [address] [place], [place] [address]    Assessment/Plan:  No problem-specific Assessment & Plan notes found for  this encounter.      Festus Barren 12/28/2022, 3:31 PM   This note was created with Dragon medical transcription system.  Any errors from dictation are unintentional.

## 2022-12-28 NOTE — H&P (View-Only) (Signed)
Patient ID: Andrea Williamson, female   DOB: 12-30-1950, 72 y.o.   MRN: [id number]  Chief Complaint  Patient presents with   New Patient (Initial Visit)    IVC filter removal, warm autoimmune hemolytic anemia    HPI Andrea Williamson is a 72 y.o. female.  I am asked to see the patient by Dr. Janese Banks for evaluation of her IVC filter for possible removal.  This was placed approximately 1 year ago when she was having severe anemia from hemolytic anemia with lower extremity DVT.  She had a very long protracted course and actually had a stroke before all of this happened as well and was hospitalized for many weeks.  She currently is not having any major issues with her legs.  No severe swelling or Williamson.  No chest Williamson or shortness of breath.  She is not on full anticoagulation and is just on Plavix which puts her at higher risk for filter thrombosis so she is referred for removal..     Past Medical History:  Diagnosis Date   Atherosclerosis    Deep vein thrombosis (DVT) ([place])    Hemolytic anemia ([place])    Hypertension    Stroke [facility])     Past Surgical History:  Procedure Laterality Date   ANKLE SURGERY Left 2008   IR CT HEAD LTD  10/05/2021   IR INTRA CRAN STENT  10/01/2021   IR PERCUTANEOUS ART THROMBECTOMY/INFUSION INTRACRANIAL INC DIAG ANGIO  10/01/2021   IR US GUIDE VASC ACCESS RIGHT  10/01/2021   IVC FILTER INSERTION N/A 01/06/2022   Procedure: IVC FILTER INSERTION;  Surgeon: Algernon Huxley, MD;  Location: [facility];  Service: Cardiovascular;  Laterality: N/A;   RADIOLOGY WITH ANESTHESIA N/A 10/01/2021   Procedure: IR WITH ANESTHESIA;  Surgeon: Radiologist, Medication, MD;  Location: [place];  Service: Radiology;  Laterality: N/A;   ROBOTIC ASSISTED BILATERAL SALPINGO OOPHERECTOMY Bilateral 06/09/2022   Procedure: XI ROBOTIC ASSISTED BILATERAL SALPINGO OOPHORECTOMY, STAGING INCLUDING TOTAL HYSTERECTOMY, OMENTECTOMY PERITONEAL BIOPSIES;  Surgeon: [name], MD;  Location: [facility];   Service: Gynecology;  Laterality: Bilateral;     Family History  Problem Relation Age of Onset   COPD Mother    Cancer Mother    Colon cancer Neg Hx    Breast cancer Neg Hx    Ovarian cancer Neg Hx    Endometrial cancer Neg Hx    Prostate cancer Neg Hx    Pancreatic cancer Neg Hx       Social History   Tobacco Use   Smoking status: Former    Packs/day: 1.50    Years: 50.00    Total pack years: 75.00    Types: Cigarettes    Quit date: 09/2021    Years since quitting: 1.2   Smokeless tobacco: Never  Vaping Use   Vaping Use: Never used  Substance Use Topics   Alcohol use: Not Currently   Drug use: Not Currently     No Known Allergies  Current Outpatient Medications  Medication Sig Dispense Refill   ALPRAZolam (XANAX) 0.25 MG tablet TAKE 1 TABLET BY MOUTH AT BEDTIME 30 tablet 1   amLODipine (NORVASC) 5 MG tablet Take 1 tablet (5 mg total) by mouth daily. 90 tablet 1   citalopram (CELEXA) 10 MG tablet Take 10 mg by mouth daily.     clopidogrel (PLAVIX) 75 MG tablet Take 75 mg by mouth daily.     losartan (COZAAR) 50 MG tablet Take  1 tablet (50 mg total) by mouth daily. 90 tablet 0   MAGNESIUM PO Take by mouth.     metoprolol succinate (TOPROL XL) 25 MG 24 hr tablet Take 1 tablet (25 mg total) by mouth daily. 30 tablet 11   pantoprazole (PROTONIX) 40 MG tablet TAKE 1 TABLET(40 MG) BY MOUTH DAILY 90 tablet 1   REPATHA SURECLICK [id number] MG/ML SOAJ ADMINISTER 1 ML UNDER THE SKIN EVERY 14 DAYS 2 mL 1   No current facility-administered medications for this visit.      REVIEW OF SYSTEMS (Negative unless checked)  Constitutional: []$ Weight loss  []$ Fever  []$ Chills Cardiac: []$ Chest Williamson   []$ Chest pressure   []$ Palpitations   []$ Shortness of breath when laying flat   []$ Shortness of breath at rest   []$ Shortness of breath with exertion. Vascular:  []$ Williamson in legs with walking   []$ Williamson in legs at rest   []$ Williamson in legs when laying flat   []$ Claudication   []$ Williamson in feet when walking   []$ Williamson in feet at rest  []$ Williamson in feet when laying flat   [x]$ History of DVT   [x]$ Phlebitis   []$ Swelling in legs   []$ Varicose veins   []$ Non-healing ulcers Pulmonary:   []$ Uses home oxygen   []$ Productive cough   []$ Hemoptysis   []$ Wheeze  []$ COPD   []$ Asthma Neurologic:  []$ Dizziness  []$ Blackouts   []$ Seizures   [x]$ History of stroke   []$ History of TIA  []$ Aphasia   []$ Temporary blindness   []$ Dysphagia   []$ Weakness or numbness in arms   []$ Weakness or numbness in legs Musculoskeletal:  []$ Arthritis   []$ Joint swelling   []$ Joint Williamson   []$ Low back Williamson Hematologic:  []$ Easy bruising  []$ Easy bleeding   []$ Hypercoagulable state   [x]$ Anemic  []$ Hepatitis Gastrointestinal:  []$ Blood in stool   []$ Vomiting blood  []$ Gastroesophageal reflux/heartburn   []$ Abdominal Williamson Genitourinary:  []$ Chronic kidney disease   []$ Difficult urination  []$ Frequent urination  []$ Burning with urination   []$ Hematuria Skin:  []$ Rashes   []$ Ulcers   []$ Wounds Psychological:  []$ History of anxiety   []$  History of major depression.    Physical Exam BP 118/73   Pulse 62   Ht 5' 2"$  (1.575 m)   Wt 167 lb (75.8 kg)   BMI 30.54 kg/m  Gen:  WD/WN, NAD Head: /AT, No temporalis wasting.  Ear/Nose/Throat: Hearing grossly intact, nares w/o erythema or drainage, oropharynx w/o Erythema/Exudate Eyes: Conjunctiva clear, sclera non-icteric  Neck: trachea midline.  No JVD.  Pulmonary:  Good air movement, respirations not labored, no use of accessory muscles  Cardiac: RRR, no JVD Vascular:  Vessel Right Left  Radial Palpable Palpable                                   Gastrointestinal:. No masses, surgical incisions, or scars. Musculoskeletal: M/S 5/5 throughout.  Extremities without ischemic changes.  No deformity or atrophy.  No significant lower extremity edema. Neurologic: Sensation grossly intact in extremities.  Symmetrical.  Speech is fluent. Motor exam as listed above. Psychiatric: Judgment intact, Mood & affect appropriate for pt's clinical  situation. Dermatologic: No rashes or ulcers noted.  No cellulitis or open wounds.    Radiology No results found.  Labs Recent Results (from the past 2160 hour(s))  Haptoglobin     Status: None   Collection Time: 10/20/22  2:09 PM  Result Value Ref Range   Haptoglobin 99 42 - 346 mg/dL    Comment: (  NOTE) Performed At: [facility] [place], [place] [id number] Rush Farmer MD [id number]   Reticulocytes     Status: Abnormal   Collection Time: 10/20/22  2:09 PM  Result Value Ref Range   Retic Ct Pct 2.9 0.4 - 3.1 %   RBC. 3.57 (L) 3.87 - 5.11 MIL/uL   Retic Count, Absolute 103.2 19.0 - 186.0 K/uL   Immature Retic Fract 18.1 (H) 2.3 - 15.9 %    Comment: Performed at [facility], [address] [place], [place] [address]  Comprehensive metabolic panel     Status: Abnormal   Collection Time: 10/20/22  2:09 PM  Result Value Ref Range   Sodium 141 135 - 145 mmol/L   Potassium 4.2 3.5 - 5.1 mmol/L   Chloride 109 98 - 111 mmol/L   CO2 25 22 - 32 mmol/L   Glucose, Bld 95 70 - 99 mg/dL    Comment: Glucose reference range applies only to samples taken after fasting for at least 8 hours.   BUN 33 (H) 8 - 23 mg/dL   Creatinine, Ser 1.44 (H) 0.44 - 1.00 mg/dL   Calcium 9.3 8.9 - 10.3 mg/dL   Total Protein 6.9 6.5 - 8.1 g/dL   Albumin 4.1 3.5 - 5.0 g/dL   AST 19 15 - 41 U/L   ALT 19 0 - 44 U/L   Alkaline Phosphatase 70 38 - 126 U/L   Total Bilirubin 0.5 0.3 - 1.2 mg/dL   GFR, Estimated 39 (L) >60 mL/min    Comment: (NOTE) Calculated using the CKD-EPI Creatinine Equation (2021)    Anion gap 7 5 - 15    Comment: Performed at [facility], [address] [place], [place] [address]  CBC with Differential/Platelet     Status: Abnormal   Collection Time: 10/20/22  2:09 PM  Result Value Ref Range   WBC 6.2 4.0 - 10.5 K/uL   RBC 3.59 (L) 3.87 - 5.11 MIL/uL   Hemoglobin 11.6 (L) 12.0 - 15.0 g/dL   HCT 34.9 (L) 36.0 - 46.0 %   MCV 97.2 80.0 -  100.0 fL   MCH 32.3 26.0 - 34.0 pg   MCHC 33.2 30.0 - 36.0 g/dL   RDW 13.6 11.5 - 15.5 %   Platelets 254 150 - 400 K/uL   nRBC 0.0 0.0 - 0.2 %   Neutrophils Relative % 62 %   Neutro Abs 3.9 1.7 - 7.7 K/uL   Lymphocytes Relative 24 %   Lymphs Abs 1.5 0.7 - 4.0 K/uL   Monocytes Relative 8 %   Monocytes Absolute 0.5 0.1 - 1.0 K/uL   Eosinophils Relative 4 %   Eosinophils Absolute 0.2 0.0 - 0.5 K/uL   Basophils Relative 1 %   Basophils Absolute 0.0 0.0 - 0.1 K/uL   Immature Granulocytes 1 %   Abs Immature Granulocytes 0.09 (H) 0.00 - 0.07 K/uL    Comment: Performed at [facility], [address] [place], [place] [address]    Assessment/Plan:  DVT (deep venous thrombosis) ([place]) The patient had a filter placed a year ago for lower extremity DVT with hemolytic anemia precluding anticoagulation.  That was a year ago and her legs are doing well.  These were not proximal DVTs.  At this point, she is not on full anticoagulation and removal of her IVC filter should be considered to avoid the high risk of thrombosis not on anticoagulation.  We discussed the procedure.  We discussed the  risks and benefits of leaving the filter versus removal of the filter.  We also discussed that some filters especially those over a year old may not be removable and may be endothelialized.  She voices her understanding.  Essential hypertension blood pressure control important in reducing the progression of atherosclerotic disease. On appropriate oral medications.   Warm autoimmune hemolytic anemia (HCC) Was one of the reason she could not be anticoagulated last year.  Follows with hematology.      Andrea Williamson 12/29/2022, 7:44 AM   This note was created with Dragon medical transcription system.  Any errors from dictation are unintentional.

## 2022-12-29 ENCOUNTER — Telehealth (INDEPENDENT_AMBULATORY_CARE_PROVIDER_SITE_OTHER): Payer: Self-pay

## 2022-12-29 NOTE — Telephone Encounter (Signed)
Spoke with the patient and she is scheduled with Dr. Lucky Cowboy on 12/30/22 with a 12:00 [facility]. Patient is scheduled for a IVC filter removal. Pre-procedure instructions were discussed and patient stated she wrote them down.

## 2022-12-29 NOTE — Assessment & Plan Note (Signed)
The patient had a filter placed a year ago for lower extremity DVT with hemolytic anemia precluding anticoagulation.  That was a year ago and her legs are doing well.  These were not proximal DVTs.  At this point, she is not on full anticoagulation and removal of her IVC filter should be considered to avoid the high risk of thrombosis not on anticoagulation.  We discussed the procedure.  We discussed the risks and benefits of leaving the filter versus removal of the filter.  We also discussed that some filters especially those over a year old may not be removable and may be endothelialized.  She voices her understanding.

## 2022-12-29 NOTE — Assessment & Plan Note (Signed)
blood pressure control important in reducing the progression of atherosclerotic disease. On appropriate oral medications.  

## 2022-12-29 NOTE — Assessment & Plan Note (Signed)
Was one of the reason she could not be anticoagulated last year.  Follows with hematology.

## 2022-12-29 NOTE — Patient Instructions (Signed)
Inferior Vena Cava Filter Removal  Inferior vena cava filter removal is a procedure to take out a metal filter that was placed into a large vein in the abdomen (inferior vena cava, or IVC). An IVC filter prevents blood clots in the legs or pelvis from traveling to the lungs. Some IVC filters are designed to be removed (retrievable filters). You may have your filter removed when the danger of forming blood clots has passed or when you can take blood-thinning medicine to prevent blood clots. In some cases, the filter may need to be removed because it becomes damaged, is not working, or is causing problems. Most filters can be removed through the vein (percutaneously). In the rare cases when the health care provider is unable to remove the filter percutaneously, one of these steps may be taken: A more invasive, open surgery. The filter may be left in place. Tell a health care provider about: Any allergies you have, including iodine. All medicines you are taking, including vitamins, herbs, eye drops, creams, and over-the-counter medicines. Any problems you or family members have had with anesthetic medicines or with contrast dyes that are used during imaging tests. Any bleeding problems you have. Any surgeries you have had. Any medical conditions you have. Whether you are pregnant or may be pregnant. What are the risks? Generally, this is a safe procedure. However, serious problems may occur, especially if the filter has been in for more than a few months. Possible problems include: Infection. Bleeding. Allergic reactions to medicines or dyes. Damage to the IVC, other blood vessels, or nearby structures. A blood clot or a piece of the filter breaking loose and traveling to the lungs. What happens before the procedure? When to stop eating and drinking Follow instructions from your health care provider about what you may eat and drink before your procedure. These may include: 8 hours before your  procedure Stop eating most foods. Do not eat meat, fried foods, or fatty foods. Eat only light foods, such as toast or crackers. All liquids are okay except energy drinks and alcohol. 6 hours before your procedure Stop eating. Drink only clear liquids, such as water, clear fruit juice, black coffee, plain tea, and sports drinks. Do not drink energy drinks or alcohol. 2 hours before your procedure Stop drinking all liquids. You may be allowed to take medicines with small sips of water. If you do not follow your health care provider's instructions, your procedure may be delayed or canceled. Medicines Ask your health care provider about: Changing or stopping your regular medicines. This is especially important if you are taking diabetes medicines or blood thinners. Taking medicines such as aspirin and ibuprofen. These medicines can thin your blood. Do not take these medicines unless your health care provider tells you to take them. Taking over-the-counter medicines, vitamins, herbs, and supplements. General instructions Do not use any products that contain nicotine or tobacco for at least 4 weeks before the procedure. These products include cigarettes, chewing tobacco, and vaping devices, such as e-cigarettes. If you need help quitting, ask your health care provider. Ask your health care provider: How your surgery site will be marked. What steps will be taken to help prevent infection. These steps may include: Removing hair at the surgery site. Washing skin with a germ-killing soap. Receiving antibiotic medicine. If you will be going home right after the procedure, plan to have a responsible adult: Take you home from the hospital or clinic. You will not be allowed to drive. Care for  you for the time you are told. What happens during the procedure? An IV will be inserted into one of your veins. You will be given one or more of the following: A medicine to help you relax (sedative). A  medicine to numb the area (local anesthetic). A medicine to make you fall asleep (general anesthetic). The procedure will be done through a vein in your groin or neck that leads to the IVC. A small incision will be made over the vein. A long, thin tube (catheter) will be inserted into the vein. The catheter will be moved through your vein and into your IVC. X-rays may be done to help guide the catheter into place. Dye may be injected through the catheter before the X-rays to make the catheter and filter easier to see. When the catheter reaches the filter, a hook (snare) on the end of the catheter may be used to latch on to the filter. In some cases, a grasping instrument (forceps) may be threaded through the catheter to gently grab and remove the filter instead. After the filter has been hooked or grasped, the filter and instruments will be pulled out through the catheter. The catheter will be removed through the insertion site in your skin. Pressure will be placed over your insertion site until bleeding stops. A bandage (dressing) will be placed over the catheter insertion site. The procedure may vary among health care providers and hospitals. What happens after the procedure? Your blood pressure, heart rate, breathing rate, and blood oxygen level will be monitored until you leave the hospital or clinic. You may need to stay in bed (be on bed rest) for a period of time. Your insertion site will be monitored for the first few hours for any signs of bleeding. If you were given a sedative during the procedure, it can affect you for several hours. Do not drive or operate machinery until your health care provider says that it is safe. Summary Inferior vena cava (IVC) filter removal is a procedure to take out a filter that was placed to prevent blood clots from traveling to your lungs. You may have your filter removed when the danger of forming blood clots has passed or when you can take  blood-thinning medicines to prevent blood clots. In some cases, a filter is removed because there is a problem with it. A long, thin tube (catheter) will be inserted through a vein in your neck or groin. Then, the filter will be gently grasped and pulled out through the catheter. Plan to have a responsible adult care for you for the time you are told. This information is not intended to replace advice given to you by your health care provider. Make sure you discuss any questions you have with your health care provider. Document Revised: 11/17/2021 Document Reviewed: 11/17/2021 Elsevier Patient Education  [place].

## 2022-12-30 ENCOUNTER — Other Ambulatory Visit: Payer: Self-pay

## 2022-12-30 ENCOUNTER — Encounter: Payer: Self-pay | Admitting: Vascular Surgery

## 2022-12-30 ENCOUNTER — Encounter
Admission: RE | Disposition: A | Discharge status: Home / Self Care | Payer: Self-pay | Source: Home / Self Care | Attending: Vascular Surgery

## 2022-12-30 ENCOUNTER — Ambulatory Visit
Admission: RE | Admit: 2022-12-30 | Discharge: 2022-12-30 | Disposition: A | Discharge status: Home / Self Care | Payer: PPO | Attending: Vascular Surgery | Admitting: Vascular Surgery

## 2022-12-30 DIAGNOSIS — Z86718 Personal history of other venous thrombosis and embolism: Secondary | ICD-10-CM | POA: Diagnosis not present

## 2022-12-30 DIAGNOSIS — Z7901 Long term (current) use of anticoagulants: Secondary | ICD-10-CM | POA: Diagnosis not present

## 2022-12-30 DIAGNOSIS — I10 Essential (primary) hypertension: Secondary | ICD-10-CM | POA: Diagnosis not present

## 2022-12-30 DIAGNOSIS — I82409 Acute embolism and thrombosis of unspecified deep veins of unspecified lower extremity: Secondary | ICD-10-CM

## 2022-12-30 DIAGNOSIS — Z4589 Encounter for adjustment and management of other implanted devices: Secondary | ICD-10-CM | POA: Insufficient documentation

## 2022-12-30 DIAGNOSIS — Z8673 Personal history of transient ischemic attack (TIA), and cerebral infarction without residual deficits: Secondary | ICD-10-CM | POA: Insufficient documentation

## 2022-12-30 DIAGNOSIS — D5911 Warm autoimmune hemolytic anemia: Secondary | ICD-10-CM | POA: Diagnosis not present

## 2022-12-30 DIAGNOSIS — Z7902 Long term (current) use of antithrombotics/antiplatelets: Secondary | ICD-10-CM | POA: Diagnosis not present

## 2022-12-30 HISTORY — PX: IVC FILTER REMOVAL: CATH118246

## 2022-12-30 SURGERY — IVC FILTER REMOVAL
Anesthesia: Moderate Sedation

## 2022-12-30 MED ORDER — MIDAZOLAM HCL 2 MG/2ML IJ SOLN
INTRAMUSCULAR | Status: DC | PRN
  Administered 2022-12-30: 2 mg via INTRAVENOUS

## 2022-12-30 MED ORDER — FENTANYL CITRATE (PF) 100 MCG/2ML IJ SOLN
INTRAMUSCULAR | Status: DC | PRN
  Administered 2022-12-30: 50 ug via INTRAVENOUS

## 2022-12-30 MED ORDER — MIDAZOLAM HCL 2 MG/2ML IJ SOLN
INTRAMUSCULAR | Status: AC
  Filled 2022-12-30: qty 2

## 2022-12-30 MED ORDER — METHYLPREDNISOLONE SODIUM SUCC 125 MG IJ SOLR: 125.0000 mg | Freq: Once | INTRAMUSCULAR | Status: DC | PRN

## 2022-12-30 MED ORDER — HEPARIN SODIUM (PORCINE) 1000 UNIT/ML IJ SOLN
INTRAMUSCULAR | Status: AC
  Filled 2022-12-30: qty 10

## 2022-12-30 MED ORDER — HYDROMORPHONE HCL 1 MG/ML IJ SOLN: 1.0000 mg | Freq: Once | INTRAMUSCULAR | Status: DC | PRN

## 2022-12-30 MED ORDER — SODIUM CHLORIDE 0.9 % IV SOLN: INTRAVENOUS | Status: DC

## 2022-12-30 MED ORDER — FENTANYL CITRATE (PF) 100 MCG/2ML IJ SOLN
INTRAMUSCULAR | Status: AC
  Filled 2022-12-30: qty 2

## 2022-12-30 MED ORDER — DIPHENHYDRAMINE HCL 50 MG/ML IJ SOLN: 50.0000 mg | Freq: Once | INTRAMUSCULAR | Status: DC | PRN

## 2022-12-30 MED ORDER — CEFAZOLIN SODIUM-DEXTROSE 2-4 GM/100ML-% IV SOLN: 2.0000 g | INTRAVENOUS | Status: AC

## 2022-12-30 MED ORDER — FAMOTIDINE 20 MG PO TABS: 40.0000 mg | ORAL_TABLET | Freq: Once | ORAL | Status: DC | PRN

## 2022-12-30 MED ORDER — CEFAZOLIN SODIUM-DEXTROSE 2-4 GM/100ML-% IV SOLN
INTRAVENOUS | Status: AC
  Administered 2022-12-30: 2 g via INTRAVENOUS
  Filled 2022-12-30: qty 100

## 2022-12-30 MED ORDER — MIDAZOLAM HCL 2 MG/ML PO SYRP: 8.0000 mg | ORAL_SOLUTION | Freq: Once | ORAL | Status: DC | PRN

## 2022-12-30 MED ORDER — ONDANSETRON HCL 4 MG/2ML IJ SOLN: 4.0000 mg | Freq: Four times a day (QID) | INTRAMUSCULAR | Status: DC | PRN

## 2022-12-30 SURGICAL SUPPLY — 4 items
COVER PROBE ULTRASOUND 5X96 (MISCELLANEOUS) IMPLANT
PACK ANGIOGRAPHY (CUSTOM PROCEDURE TRAY) ×1 IMPLANT
SET CLOVERSNARE FLT RETRIEVAL (MISCELLANEOUS) IMPLANT
WIRE GUIDERIGHT .035X150 (WIRE) IMPLANT

## 2022-12-30 NOTE — Op Note (Signed)
[facility]   OPERATIVE NOTE    PRE-OPERATIVE DIAGNOSIS:  1. DVT 2. status post IVC filter placement  POST-OPERATIVE DIAGNOSIS: Same as above  PROCEDURE: 1.   Ultrasound guidance for vascular access right jugular vein 2.   Catheter placement into inferior vena cava from right jugular vein 3.   Inferior venacavogram 4.   Retrieval of Option Elite IVC filter  SURGEON: Leotis Pain, MD  ASSISTANT(S): None  ANESTHESIA: Local with moderate conscious sedation for approximately 12 minutes using 2 mg of Versed and 50 mcg of Fentanyl  ESTIMATED BLOOD LOSS: 5 cc  CONTRAST:  15 cc  FLUORO TIME:  0.7 minutes  FINDING(S): 1.  patent IVC  SPECIMEN(S):  IVC filter  INDICATIONS:    Patient is a 72 y.o. female who presents with a previous history of IVC filter placement. Patient has had this for over a year and does not have an acute DVT and no longer needs this filter. The patient remains on anticoagulation. Risks and benefits were discussed, and informed consent was obtained.  DESCRIPTION: After obtaining full informed written consent, the patient was brought back to the vascular suite and placed supine upon the table. Moderate conscious sedation was administered during a face to face encounter with the patient throughout the procedure with my supervision of the RN administering medicines and monitoring the patient's vital signs, pulse oximetry, telemetry and mental status throughout from the start of the procedure until the patient was taken to the recovery room.  After obtaining adequate anesthesia, the patient was prepped and draped in the standard fashion.  The right jugular vein was visualized with ultrasound and found to be widely patent. It was then accessed under direct ultrasound guidance without difficulty with the Seldinger needle and a permanent image was recorded. A J-wire was placed. After skin nick and dilatation, the retrieval sheath was placed over the  wire and advanced into the inferior vena cava. Inferior vena cava was imaged and found to be widely patent on inferior venacavogram. The filter was straight in its orientation. The retrieval snare was then placed through the sheath and the hook of the filter was snared without difficulty. The sheath was then advanced, and the filter was collapsed and brought into the sheath in its entirety. It was then removed from the body in its entirety. The retrieval sheath was then removed. Pressure was held at the access site and sterile dressing was placed. The patient was taken to the recovery room in stable condition having tolerated the procedure well.  COMPLICATIONS: None  CONDITION:  Stable   Leotis Pain 12/30/2022 3:19 PM  This note was created with Dragon Medical transcription system. Any errors in dictation are purely unintentional.

## 2022-12-30 NOTE — Interval H&P Note (Signed)
History and Physical Interval Note:  12/30/2022 2:09 PM  Andrea Williamson  has presented today for surgery, with the diagnosis of IVC Filter Removal    DVT.  The various methods of treatment have been discussed with the patient and family. After consideration of risks, benefits and other options for treatment, the patient has consented to  Procedure(s): IVC FILTER REMOVAL (N/A) as a surgical intervention.  The patient's history has been reviewed, patient examined, no change in status, stable for surgery.  I have reviewed the patient's chart and labs.  Questions were answered to the patient's satisfaction.     Leotis Pain

## 2022-12-31 ENCOUNTER — Encounter: Payer: Self-pay | Admitting: Vascular Surgery

## 2023-01-26 ENCOUNTER — Other Ambulatory Visit: Payer: Self-pay | Admitting: Neurology

## 2023-02-10 ENCOUNTER — Ambulatory Visit (INDEPENDENT_AMBULATORY_CARE_PROVIDER_SITE_OTHER): Payer: PPO | Admitting: Nurse Practitioner

## 2023-03-28 ENCOUNTER — Other Ambulatory Visit: Payer: Self-pay | Admitting: Neurology

## 2023-04-07 ENCOUNTER — Other Ambulatory Visit: Payer: Self-pay | Admitting: Neurology

## 2023-04-25 ENCOUNTER — Inpatient Hospital Stay: Payer: PPO | Attending: Oncology

## 2023-04-25 ENCOUNTER — Inpatient Hospital Stay (HOSPITAL_BASED_OUTPATIENT_CLINIC_OR_DEPARTMENT_OTHER): Payer: PPO | Admitting: Oncology

## 2023-04-25 ENCOUNTER — Encounter: Payer: Self-pay | Admitting: Oncology

## 2023-04-25 ENCOUNTER — Other Ambulatory Visit: Payer: Self-pay | Admitting: *Deleted

## 2023-04-25 VITALS — BP 148/96 | HR 69 | Temp 98.7°F | Resp 18 | Ht 62.0 in | Wt 170.7 lb

## 2023-04-25 DIAGNOSIS — Z87891 Personal history of nicotine dependence: Secondary | ICD-10-CM | POA: Insufficient documentation

## 2023-04-25 DIAGNOSIS — R42 Dizziness and giddiness: Secondary | ICD-10-CM | POA: Insufficient documentation

## 2023-04-25 DIAGNOSIS — Z7902 Long term (current) use of antithrombotics/antiplatelets: Secondary | ICD-10-CM | POA: Insufficient documentation

## 2023-04-25 DIAGNOSIS — Z8639 Personal history of other endocrine, nutritional and metabolic disease: Secondary | ICD-10-CM | POA: Diagnosis not present

## 2023-04-25 DIAGNOSIS — D5911 Warm autoimmune hemolytic anemia: Secondary | ICD-10-CM | POA: Diagnosis not present

## 2023-04-25 DIAGNOSIS — Z7901 Long term (current) use of anticoagulants: Secondary | ICD-10-CM | POA: Diagnosis not present

## 2023-04-25 DIAGNOSIS — Z825 Family history of asthma and other chronic lower respiratory diseases: Secondary | ICD-10-CM | POA: Insufficient documentation

## 2023-04-25 DIAGNOSIS — D509 Iron deficiency anemia, unspecified: Secondary | ICD-10-CM

## 2023-04-25 DIAGNOSIS — Z86718 Personal history of other venous thrombosis and embolism: Secondary | ICD-10-CM | POA: Diagnosis not present

## 2023-04-25 DIAGNOSIS — E785 Hyperlipidemia, unspecified: Secondary | ICD-10-CM | POA: Insufficient documentation

## 2023-04-25 DIAGNOSIS — Z8673 Personal history of transient ischemic attack (TIA), and cerebral infarction without residual deficits: Secondary | ICD-10-CM | POA: Insufficient documentation

## 2023-04-25 DIAGNOSIS — Z79899 Other long term (current) drug therapy: Secondary | ICD-10-CM | POA: Diagnosis not present

## 2023-04-25 DIAGNOSIS — R5383 Other fatigue: Secondary | ICD-10-CM | POA: Diagnosis not present

## 2023-04-25 DIAGNOSIS — R110 Nausea: Secondary | ICD-10-CM | POA: Insufficient documentation

## 2023-04-25 DIAGNOSIS — G4700 Insomnia, unspecified: Secondary | ICD-10-CM | POA: Diagnosis not present

## 2023-04-25 DIAGNOSIS — Z809 Family history of malignant neoplasm, unspecified: Secondary | ICD-10-CM | POA: Insufficient documentation

## 2023-04-25 DIAGNOSIS — Z90710 Acquired absence of both cervix and uterus: Secondary | ICD-10-CM | POA: Diagnosis not present

## 2023-04-25 DIAGNOSIS — Z86711 Personal history of pulmonary embolism: Secondary | ICD-10-CM

## 2023-04-25 DIAGNOSIS — I2699 Other pulmonary embolism without acute cor pulmonale: Secondary | ICD-10-CM | POA: Insufficient documentation

## 2023-04-25 DIAGNOSIS — I10 Essential (primary) hypertension: Secondary | ICD-10-CM | POA: Insufficient documentation

## 2023-04-25 DIAGNOSIS — I82403 Acute embolism and thrombosis of unspecified deep veins of lower extremity, bilateral: Secondary | ICD-10-CM | POA: Diagnosis not present

## 2023-04-25 LAB — CBC WITH DIFFERENTIAL/PLATELET
Abs Immature Granulocytes: 0.04 10*3/uL (ref 0.00–0.07)
Basophils Absolute: 0 10*3/uL (ref 0.0–0.1)
Basophils Relative: 1 %
Eosinophils Absolute: 0.2 10*3/uL (ref 0.0–0.5)
Eosinophils Relative: 3 %
HCT: 42.3 % (ref 36.0–46.0)
Hemoglobin: 14.1 g/dL (ref 12.0–15.0)
Immature Granulocytes: 1 %
Lymphocytes Relative: 27 %
Lymphs Abs: 1.8 10*3/uL (ref 0.7–4.0)
MCH: 30.6 pg (ref 26.0–34.0)
MCHC: 33.3 g/dL (ref 30.0–36.0)
MCV: 91.8 fL (ref 80.0–100.0)
Monocytes Absolute: 0.6 10*3/uL (ref 0.1–1.0)
Monocytes Relative: 8 %
Neutro Abs: 4 10*3/uL (ref 1.7–7.7)
Neutrophils Relative %: 60 %
Platelets: 244 10*3/uL (ref 150–400)
RBC: 4.61 MIL/uL (ref 3.87–5.11)
RDW: 13.3 % (ref 11.5–15.5)
WBC: 6.5 10*3/uL (ref 4.0–10.5)
nRBC: 0 % (ref 0.0–0.2)

## 2023-04-25 LAB — COMPREHENSIVE METABOLIC PANEL
ALT: 31 U/L (ref 0–44)
AST: 25 U/L (ref 15–41)
Albumin: 4.3 g/dL (ref 3.5–5.0)
Alkaline Phosphatase: 89 U/L (ref 38–126)
Anion gap: 8 (ref 5–15)
BUN: 27 mg/dL — ABNORMAL HIGH (ref 8–23)
CO2: 22 mmol/L (ref 22–32)
Calcium: 9.3 mg/dL (ref 8.9–10.3)
Chloride: 107 mmol/L (ref 98–111)
Creatinine, Ser: 1.39 mg/dL — ABNORMAL HIGH (ref 0.44–1.00)
GFR, Estimated: 41 mL/min — ABNORMAL LOW (ref >60)
Glucose, Bld: 108 mg/dL — ABNORMAL HIGH (ref 70–99)
Potassium: 4.3 mmol/L (ref 3.5–5.1)
Sodium: 137 mmol/L (ref 135–145)
Total Bilirubin: 0.9 mg/dL (ref 0.3–1.2)
Total Protein: 7.6 g/dL (ref 6.5–8.1)

## 2023-04-25 LAB — RETICULOCYTES
Immature Retic Fract: 9.5 % (ref 2.3–15.9)
RBC.: 4.59 MIL/uL (ref 3.87–5.11)
Retic Count, Absolute: 90.4 10*3/uL (ref 19.0–186.0)
Retic Ct Pct: 2 % (ref 0.4–3.1)

## 2023-04-25 LAB — IRON AND TIBC
Iron: 80 ug/dL (ref 28–170)
Saturation Ratios: 21 % (ref 10.4–31.8)
TIBC: 386 ug/dL (ref 250–450)
UIBC: 306 ug/dL

## 2023-04-25 LAB — FERRITIN: Ferritin: 26 ng/mL (ref 11–307)

## 2023-04-25 NOTE — Progress Notes (Signed)
Hematology/Oncology Consult note [facility]  [phone] [phone]  Patient Care Team: Kandyce Rud, MD as PCP - General (Family Medicine) Debbe Odea, MD as PCP - Cardiology (Cardiology) Creig Hines, MD as Consulting Physician (Hematology and Oncology)   Name of the patient: Andrea Williamson  [id number]  15-May-1951   Date of visit: 04/25/23  Diagnosis- 1.  History of warm autoimmune hemolytic anemia 2.  History of iron deficiency anemia  Chief complaint/ Reason for visit-routine follow-up of anemia  Heme/Onc history: patient is a 72 year old female with a past medical history significant for hypertension and hyperlipidemia who was admitted to [facility] for symptoms of acute stroke when she presented with right lower extremity weakness and difficulty speaking on 10/01/2021.  CT head showed acute infarct in the left frontal lobe ACA territory.  She underwent cerebral angiogram and underwent thrombectomy with revascularization and left P2 stenting by Dr. Sherlon Handing and subsequently discharged to rehab.  She was started on aspirin and Brilinta.  During that admission patient had a normal CBC with a white count of 9.3, H&H of 13/37.8 and a platelet count of 245.  Patient was noted to have increasing icterus as well as nausea and dizziness which prompted her visit to the ER.  On admission patient was noted to have a white cell count of 22, H&H of 4.5/13.4 and MCV of 111 with a platelet count of 352.  Total bilirubin was elevated at 6 thereby raising the concern for possible hemolytic anemia.  Patient was started on high-dose steroids and transferred to [facility].  She received 1 dose of rituximab there.     CT chest abdomen and pelvis with contrast did not show any evidence of malignancy but did show a possible Right adnexal mass for which she saw Dr. Pricilla Holm from GYN oncology.  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 Hb dropped to 8. No overt GI bleeding. Eliquis was held. Ivc filter was inserted. New segmental PE. Eliquis therefore restarted and was subsequently stopped in December 2023.  Patient is on Plavix for her history of stroke  Interval history-patient is tearful today and states that it has been hard after the loss of her husband.  Her son has moved in with her and she has to take additional financial restraints because of him.  She feels that her appetite is down and she has insomnia.  ECOG PS- 1 Pain scale- 0   Review of systems- Review of Systems  Constitutional:  Positive for malaise/fatigue. Negative for chills, fever and weight loss.       Lack of appetite  HENT:  Negative for congestion, ear discharge and nosebleeds.   Eyes:  Negative for blurred vision.  Respiratory:  Negative for cough, hemoptysis, sputum production, shortness of breath and wheezing.   Cardiovascular:  Negative for chest pain, palpitations, orthopnea and claudication.  Gastrointestinal:  Negative for abdominal pain, blood in stool, constipation, diarrhea, heartburn, melena, nausea and vomiting.  Genitourinary:  Negative for dysuria, flank pain, frequency, hematuria and urgency.  Musculoskeletal:  Negative for back pain, joint pain and myalgias.  Skin:  Negative for rash.  Neurological:  Negative for dizziness, tingling, focal weakness, seizures, weakness and headaches.  Endo/Heme/Allergies:  Does not bruise/bleed easily.  Psychiatric/Behavioral:  Negative for depression and suicidal ideas. The patient  has insomnia.       No Known Allergies   Past Medical History:  Diagnosis Date   Atherosclerosis    Deep vein thrombosis (DVT) (HCC)    Hemolytic anemia (HCC)    Hypertension    Stroke [facility])      Past Surgical History:  Procedure Laterality Date    ANKLE SURGERY Left 2008   IR CT HEAD LTD  10/05/2021   IR INTRA CRAN STENT  10/01/2021   IR PERCUTANEOUS ART THROMBECTOMY/INFUSION INTRACRANIAL INC DIAG ANGIO  10/01/2021   IR US GUIDE VASC ACCESS RIGHT  10/01/2021   IVC FILTER INSERTION N/A 01/06/2022   Procedure: IVC FILTER INSERTION;  Surgeon: Annice Needy, MD;  Location: [facility] INVASIVE CV LAB;  Service: Cardiovascular;  Laterality: N/A;   IVC FILTER REMOVAL N/A 12/30/2022   Procedure: IVC FILTER REMOVAL;  Surgeon: Annice Needy, MD;  Location: [facility] INVASIVE CV LAB;  Service: Cardiovascular;  Laterality: N/A;   RADIOLOGY WITH ANESTHESIA N/A 10/01/2021   Procedure: IR WITH ANESTHESIA;  Surgeon: Radiologist, Medication, MD;  Location: [facility] OR;  Service: Radiology;  Laterality: N/A;   ROBOTIC ASSISTED BILATERAL SALPINGO OOPHERECTOMY Bilateral 06/09/2022   Procedure: XI ROBOTIC ASSISTED BILATERAL SALPINGO OOPHORECTOMY, STAGING INCLUDING TOTAL HYSTERECTOMY, OMENTECTOMY PERITONEAL BIOPSIES;  Surgeon: Carver Fila, MD;  Location: [facility] ORS;  Service: Gynecology;  Laterality: Bilateral;    Social History   Socioeconomic History   Marital status: Widowed    Spouse name: Not on file   Number of children: 3   Years of education: 87   Highest education level: 12th grade  Occupational History   Occupation: Retired  Tobacco Use   Smoking status: Former    Packs/day: 1.50    Years: 50.00    Additional pack years: 0.00    Total pack years: 75.00    Types: Cigarettes    Quit date: 09/2021    Years since quitting: 1.6   Smokeless tobacco: Never  Vaping Use   Vaping Use: Never used  Substance and Sexual Activity   Alcohol use: Not Currently   Drug use: Not Currently   Sexual activity: Not Currently  Other Topics Concern   Not on file  Social History Narrative   Lives alone. One cat, inside and outside.   Social Determinants of Health   Financial Resource Strain: Low Risk  (11/26/2021)   Overall Financial Resource Strain (CARDIA)     Difficulty of Paying Living Expenses: Not hard at all  Food Insecurity: No Food Insecurity (11/26/2021)   Hunger Vital Sign    Worried About Running Out of Food in the Last Year: Never true    Ran Out of Food in the Last Year: Never true  Transportation Needs: No Transportation Needs (11/26/2021)   PRAPARE - Administrator, Civil Service (Medical): No    Lack of Transportation (Non-Medical): No  Physical Activity: Insufficiently Active (11/26/2021)   Exercise Vital Sign    Days of Exercise per Week: 2 days    Minutes of Exercise per Session: 60 min  Stress: Stress Concern Present (11/26/2021)   [employer] of Occupational Health - Occupational Stress Questionnaire    Feeling of Stress : To some extent  Social Connections: Moderately Isolated (11/26/2021)   Social Connection and Isolation Panel [NHANES]    Frequency of Communication with Friends and Family: More than three times a week    Frequency of Social Gatherings with Friends and Family: More than three times a week  Attends Religious Services: Never    Active Member of Clubs or Organizations: No    Attends Banker Meetings: Never    Marital Status: Married  Catering manager Violence: Not At Risk (11/26/2021)   Humiliation, Afraid, Rape, and Kick questionnaire    Fear of Current or Ex-Partner: No    Emotionally Abused: No    Physically Abused: No    Sexually Abused: No    Family History  Problem Relation Age of Onset   COPD Mother    Cancer Mother    Colon cancer Neg Hx    Breast cancer Neg Hx    Ovarian cancer Neg Hx    Endometrial cancer Neg Hx    Prostate cancer Neg Hx    Pancreatic cancer Neg Hx      Current Outpatient Medications:    ALPRAZolam (XANAX) 0.25 MG tablet, TAKE 1 TABLET BY MOUTH AT BEDTIME, Disp: 30 tablet, Rfl: 1   amLODipine (NORVASC) 5 MG tablet, Take 1 tablet (5 mg total) by mouth daily., Disp: 90 tablet, Rfl: 1   citalopram (CELEXA) 10 MG tablet, Take 10 mg by mouth  daily., Disp: , Rfl:    clopidogrel (PLAVIX) 75 MG tablet, TAKE 1 TABLET(75 MG) BY MOUTH DAILY, Disp: 30 tablet, Rfl: 1   losartan (COZAAR) 50 MG tablet, Take 1 tablet (50 mg total) by mouth daily., Disp: 90 tablet, Rfl: 0   MAGNESIUM PO, Take by mouth., Disp: , Rfl:    pantoprazole (PROTONIX) 40 MG tablet, TAKE 1 TABLET(40 MG) BY MOUTH DAILY, Disp: 90 tablet, Rfl: 1   REPATHA SURECLICK 140 MG/ML SOAJ, ADMINISTER 1 ML(140MG ) UNDER THE SKIN EVERY 14 DAYS, Disp: 2 mL, Rfl: 1   metoprolol succinate (TOPROL XL) 25 MG 24 hr tablet, Take 1 tablet (25 mg total) by mouth daily., Disp: 30 tablet, Rfl: 11  Physical exam:  Vitals:   04/25/23 1108  BP: (!) 148/96  Pulse: 69  Resp: 18  Temp: 98.7 F (37.1 C)  TempSrc: Tympanic  SpO2: 99%  Weight: 170 lb 11.2 oz (77.4 kg)  Height: 5\' 2"  (1.575 m)   Physical Exam Cardiovascular:     Rate and Rhythm: Normal rate and regular rhythm.     Heart sounds: Normal heart sounds.  Pulmonary:     Effort: Pulmonary effort is normal.     Breath sounds: Normal breath sounds.  Abdominal:     General: Bowel sounds are normal.     Palpations: Abdomen is soft.  Skin:    General: Skin is warm and dry.  Neurological:     Mental Status: She is alert and oriented to person, place, and time.         Latest Ref Rng & Units 04/25/2023   11:01 AM  CMP  Glucose 70 - 99 mg/dL 409   BUN 8 - 23 mg/dL 27   Creatinine 8.11 - 1.00 mg/dL 9.14   Sodium 782 - 956 mmol/L 137   Potassium 3.5 - 5.1 mmol/L 4.3   Chloride 98 - 111 mmol/L 107   CO2 22 - 32 mmol/L 22   Calcium 8.9 - 10.3 mg/dL 9.3   Total Protein 6.5 - 8.1 g/dL 7.6   Total Bilirubin 0.3 - 1.2 mg/dL 0.9   Alkaline Phos 38 - 126 U/L 89   AST 15 - 41 U/L 25   ALT 0 - 44 U/L 31       Latest Ref Rng & Units 04/25/2023   11:01 AM  CBC  WBC 4.0 - 10.5 K/uL 6.5   Hemoglobin 12.0 - 15.0 g/dL 16.1   Hematocrit 09.6 - 46.0 % 42.3   Platelets 150 - 400 K/uL 244      Assessment and plan- Patient is a 71  y.o. female here for follow-up of following issues:  Warm autoimmune hemolytic anemia: Patient is not presently anemicAnd reticulocyte count is normal.  No evidence of hemolysis.  Continue to monitor  Iron deficiency anemia: She last received Feraheme in November 2023.  Iron studies are currently pending.  However given that she is not anemic it is unlikely that she requires any IV iron at this time.   History of DVT and PE: This was diagnosed when she had warm autoimmune hemolytic anemia and that was likely the trigger.  She also had borderline serous adnexal tumor which may have triggered a procoagulant state.  She has been off anticoagulation since December 2023 without any recurrence of thromboembolic events.  CBC ferritin and iron studies in 6 months in 1 year and I will see her back in 1 year   Visit Diagnosis 1. Iron deficiency anemia, unspecified iron deficiency anemia type   2. Warm autoimmune hemolytic anemia (HCC)   3. History of pulmonary embolism      Dr. Owens Shark, MD, MPH [facility] [phone] 04/25/2023 12:53 PM

## 2023-04-26 NOTE — Telephone Encounter (Signed)
Fax received stated [facility]  approved 12/29/22-06/27/23

## 2023-05-21 ENCOUNTER — Other Ambulatory Visit: Payer: Self-pay | Admitting: Neurology

## 2023-06-05 ENCOUNTER — Other Ambulatory Visit: Payer: Self-pay | Admitting: Neurology

## 2023-06-20 ENCOUNTER — Telehealth: Payer: Self-pay | Admitting: Neurology

## 2023-06-20 MED ORDER — CLOPIDOGREL BISULFATE 75 MG PO TABS: 75.0000 mg | ORAL_TABLET | Freq: Every day | ORAL | 4 refills | Status: DC

## 2023-06-20 NOTE — Telephone Encounter (Signed)
Pt's new insurance requested a new prescription for clopidogrel (PLAVIX) 75 MG tablet.     Pt's current insurance information. [employer] Member ID [id number] Group #[id number] BIN [id number] PCN [id number]

## 2023-06-20 NOTE — Telephone Encounter (Signed)
Pt has called asking that the Rx for the clopidogrel (PLAVIX) 75 MG tablet.   Be sent to [facility] fax [phone] ph#[phone]

## 2023-06-22 DIAGNOSIS — E7800 Pure hypercholesterolemia, unspecified: Secondary | ICD-10-CM | POA: Diagnosis not present

## 2023-06-22 DIAGNOSIS — N1831 Chronic kidney disease, stage 3a: Secondary | ICD-10-CM | POA: Diagnosis not present

## 2023-06-27 DIAGNOSIS — K219 Gastro-esophageal reflux disease without esophagitis: Secondary | ICD-10-CM | POA: Diagnosis not present

## 2023-06-27 DIAGNOSIS — I129 Hypertensive chronic kidney disease with stage 1 through stage 4 chronic kidney disease, or unspecified chronic kidney disease: Secondary | ICD-10-CM | POA: Diagnosis not present

## 2023-06-27 DIAGNOSIS — N189 Chronic kidney disease, unspecified: Secondary | ICD-10-CM | POA: Diagnosis not present

## 2023-06-27 DIAGNOSIS — D631 Anemia in chronic kidney disease: Secondary | ICD-10-CM | POA: Diagnosis not present

## 2023-06-27 DIAGNOSIS — Z79899 Other long term (current) drug therapy: Secondary | ICD-10-CM | POA: Diagnosis not present

## 2023-06-27 DIAGNOSIS — F419 Anxiety disorder, unspecified: Secondary | ICD-10-CM | POA: Diagnosis not present

## 2023-06-27 DIAGNOSIS — E785 Hyperlipidemia, unspecified: Secondary | ICD-10-CM | POA: Diagnosis not present

## 2023-06-29 ENCOUNTER — Telehealth: Payer: Self-pay | Admitting: Neurology

## 2023-06-29 NOTE — Telephone Encounter (Signed)
PA was attempted under [employer]hte[employer] advantage and it came back stating that pt no longer active.  Called the pt and she provided [place] with her new insurance information [employer]  The information provided by the patient was also incorrect when entered in [facility]  Called the number provided by the pt [phone]. They were unable to provide me the Member ID and transferred me to the PA line to complete over the phone. PA completed over the phone with Westly Pam. PA approved for the pt until 11/15/2023.  REF#- [id number]

## 2023-06-29 NOTE — Telephone Encounter (Signed)
Pt called stating that she has received a letter from her insurance stating that the medication is needing a PA and that they will fill it this time but can not continue to fill it without that PA. Please advise.

## 2023-06-30 NOTE — Telephone Encounter (Signed)
For future reference until her new RX card can be scanned in she has [facility] ID- [id number] RX BIN [id number] PCN# [id number]

## 2023-07-22 ENCOUNTER — Other Ambulatory Visit: Payer: Self-pay | Admitting: Neurology

## 2023-08-31 ENCOUNTER — Other Ambulatory Visit: Payer: Self-pay | Admitting: Anesthesiology

## 2023-09-06 ENCOUNTER — Other Ambulatory Visit: Payer: Self-pay | Admitting: Anesthesiology

## 2023-09-06 MED ORDER — CLOPIDOGREL BISULFATE 75 MG PO TABS: 75.0000 mg | ORAL_TABLET | Freq: Every day | ORAL | 3 refills | Status: DC

## 2023-09-23 ENCOUNTER — Other Ambulatory Visit: Payer: Self-pay | Admitting: Neurology

## 2023-09-25 ENCOUNTER — Encounter: Payer: Self-pay | Admitting: Oncology

## 2023-09-27 ENCOUNTER — Other Ambulatory Visit: Payer: Self-pay | Admitting: Neurology

## 2023-09-27 NOTE — Telephone Encounter (Signed)
Rx refilled per last office visit note.

## 2023-09-27 NOTE — Telephone Encounter (Signed)
Rx refilled.

## 2023-10-25 ENCOUNTER — Inpatient Hospital Stay: Payer: Medicare HMO | Attending: Oncology

## 2023-10-25 ENCOUNTER — Encounter: Payer: Self-pay | Admitting: Oncology

## 2023-10-25 DIAGNOSIS — Z809 Family history of malignant neoplasm, unspecified: Secondary | ICD-10-CM | POA: Diagnosis not present

## 2023-10-25 DIAGNOSIS — D5911 Warm autoimmune hemolytic anemia: Secondary | ICD-10-CM | POA: Diagnosis not present

## 2023-10-25 DIAGNOSIS — G4700 Insomnia, unspecified: Secondary | ICD-10-CM | POA: Insufficient documentation

## 2023-10-25 DIAGNOSIS — D509 Iron deficiency anemia, unspecified: Secondary | ICD-10-CM | POA: Diagnosis not present

## 2023-10-25 DIAGNOSIS — Z87891 Personal history of nicotine dependence: Secondary | ICD-10-CM | POA: Insufficient documentation

## 2023-10-25 DIAGNOSIS — R5383 Other fatigue: Secondary | ICD-10-CM | POA: Diagnosis not present

## 2023-10-25 DIAGNOSIS — Z86711 Personal history of pulmonary embolism: Secondary | ICD-10-CM | POA: Diagnosis not present

## 2023-10-25 DIAGNOSIS — Z825 Family history of asthma and other chronic lower respiratory diseases: Secondary | ICD-10-CM | POA: Diagnosis not present

## 2023-10-25 DIAGNOSIS — Z79899 Other long term (current) drug therapy: Secondary | ICD-10-CM | POA: Insufficient documentation

## 2023-10-25 LAB — CBC WITH DIFFERENTIAL/PLATELET
Abs Immature Granulocytes: 0.08 10*3/uL — ABNORMAL HIGH (ref 0.00–0.07)
Basophils Absolute: 0.1 10*3/uL (ref 0.0–0.1)
Basophils Relative: 1 %
Eosinophils Absolute: 0.2 10*3/uL (ref 0.0–0.5)
Eosinophils Relative: 4 %
HCT: 42.5 % (ref 36.0–46.0)
Hemoglobin: 14 g/dL (ref 12.0–15.0)
Immature Granulocytes: 1 %
Lymphocytes Relative: 37 %
Lymphs Abs: 2 10*3/uL (ref 0.7–4.0)
MCH: 30.7 pg (ref 26.0–34.0)
MCHC: 32.9 g/dL (ref 30.0–36.0)
MCV: 93.2 fL (ref 80.0–100.0)
Monocytes Absolute: 0.6 10*3/uL (ref 0.1–1.0)
Monocytes Relative: 11 %
Neutro Abs: 2.6 10*3/uL (ref 1.7–7.7)
Neutrophils Relative %: 46 %
Platelets: 255 10*3/uL (ref 150–400)
RBC: 4.56 MIL/uL (ref 3.87–5.11)
RDW: 12.6 % (ref 11.5–15.5)
WBC: 5.6 10*3/uL (ref 4.0–10.5)
nRBC: 0 % (ref 0.0–0.2)

## 2023-10-25 LAB — COMPREHENSIVE METABOLIC PANEL
ALT: 71 U/L — ABNORMAL HIGH (ref 0–44)
AST: 42 U/L — ABNORMAL HIGH (ref 15–41)
Albumin: 4.3 g/dL (ref 3.5–5.0)
Alkaline Phosphatase: 76 U/L (ref 38–126)
Anion gap: 8 (ref 5–15)
BUN: 28 mg/dL — ABNORMAL HIGH (ref 8–23)
CO2: 25 mmol/L (ref 22–32)
Calcium: 9.8 mg/dL (ref 8.9–10.3)
Chloride: 109 mmol/L (ref 98–111)
Creatinine, Ser: 1.23 mg/dL — ABNORMAL HIGH (ref 0.44–1.00)
GFR, Estimated: 47 mL/min — ABNORMAL LOW (ref >60)
Glucose, Bld: 102 mg/dL — ABNORMAL HIGH (ref 70–99)
Potassium: 4.6 mmol/L (ref 3.5–5.1)
Sodium: 142 mmol/L (ref 135–145)
Total Bilirubin: 0.8 mg/dL (ref <1.2)
Total Protein: 7.4 g/dL (ref 6.5–8.1)

## 2023-10-25 LAB — IRON AND TIBC
Iron: 113 ug/dL (ref 28–170)
Saturation Ratios: 29 % (ref 10.4–31.8)
TIBC: 393 ug/dL (ref 250–450)
UIBC: 280 ug/dL

## 2023-10-25 LAB — RETICULOCYTES
Immature Retic Fract: 9.4 % (ref 2.3–15.9)
RBC.: 4.52 MIL/uL (ref 3.87–5.11)
Retic Count, Absolute: 91.3 10*3/uL (ref 19.0–186.0)
Retic Ct Pct: 2 % (ref 0.4–3.1)

## 2023-10-25 LAB — FERRITIN: Ferritin: 43 ng/mL (ref 11–307)

## 2023-10-26 ENCOUNTER — Encounter: Payer: Self-pay | Admitting: Oncology

## 2023-10-27 ENCOUNTER — Telehealth: Payer: Self-pay | Admitting: Neurology

## 2023-10-27 ENCOUNTER — Ambulatory Visit: Payer: Medicare HMO | Admitting: Neurology

## 2023-10-27 VITALS — BP 142/86 | HR 55 | Ht 62.0 in | Wt 175.0 lb

## 2023-10-27 DIAGNOSIS — R251 Tremor, unspecified: Secondary | ICD-10-CM | POA: Diagnosis not present

## 2023-10-27 DIAGNOSIS — I672 Cerebral atherosclerosis: Secondary | ICD-10-CM | POA: Diagnosis not present

## 2023-10-27 DIAGNOSIS — Z8673 Personal history of transient ischemic attack (TIA), and cerebral infarction without residual deficits: Secondary | ICD-10-CM

## 2023-10-27 NOTE — Patient Instructions (Addendum)
I had a long d/w patient  and her daughter in law about her remote stroke,intracranial stent, risk for recurrent stroke/TIAs, personally independently reviewed imaging studies and stroke evaluation results and answered questions.Continue   Plavix 75 mg daily secondary stroke prevention for her intracranial stent . Maintain strict control of hypertension with blood pressure goal below 130/90, diabetes with hemoglobin A1c goal below 6.5% and lipids with LDL cholesterol goal below 70 mg/dL. I also advised the patient to eat a healthy diet with plenty of whole grains, cereals, fruits and vegetables, exercise regularly and maintain ideal body weight.   Patient also has longstanding history of mild benign essential tremor and not functionally disabling and she is not keen to try medications at the present time.  She is on low-dose metoprolol but given her resting heart rate of 51 do not recommend increasing the dose at the present time..  Followup in the future with me in 1 year or call earlier if necessary.

## 2023-10-27 NOTE — Progress Notes (Signed)
[facility] [address] [place]. [place] [address] [phone]       OFFICE FOLLOW-UP NOTE  [name] Date of Birth:  July 21, 1951 Medical Record Number:  [id number]   HPI: Initial visit 04/13/2022 Andrea Williamson is a 72 year old lady who is seen today for initial office follow-up visit following hospital admission for stroke in November 2022.  She is accompanied by sister-in-law and history is obtained from them and review of electronic medical records and I personally reviewed pertinent available imaging films in PACS.  She has past medical history of hyperlipidemia and hypertension who was admitted on 10/01/2021 to [facility] for right leg weakness which developed 2 days prior to admission.  She felt she was unsteady on her gait and her leg was no longer supporting her.  CT head showed acute left anterior cerebral artery infarct with CT angiogram showing left A2/ACA occlusion CT perfusion showed 0 core and only 27 cc of penumbra she was considered a candidate for revascularization and underwent mechanical thrombectomy with revascularization but vessel Occlusion Requiring a Rescue Left ACA Stent withTICI 3 revascularization.  MRI scan redemonstrated infarcts in left ACA and ACA/PCA watershed territory and 1 tiny infarct in the right medial parietal lobe.  Follow-up MRI showed patent left A2 stent.  2D echo showed ejection fraction 65 to 70%.  LDL cholesterol was elevated at 232 mg percent and hemoglobin A1c was 5.0.  Patient was on no antithrombotics prior to admission and was placed on aspirin and Brilinta.  She was seen by physical occupational and speech therapy recommended inpatient rehab.  With plans for dual antiplatelet therapy for 6 months then aspirin alone.  Patient states subsequently she has been diagnosed with deep vein thrombosis in December 2022 in bilateral lower extremities and has been switched to Brilinta and Eliquis which is tolerating well with easy  bruising but no major bleeding episode.  Recently the cost of Brilinta has gone up and her co-pay has jumped from $46-$300 and she can no longer afford it.  She did have an 1 episode of nasal bleed in February and was seen in the emergency room.  She is also subsequently been diagnosed with hemolytic anemia and sees hematologist Dr. Smith Robert in [place] and has appointment to see her next month.  She did have follow-up lipid profile on 02/04/2022 which still showed elevated LDL cholesterol of 11/25/2016 with total cholesterol 209.  She had a 2D echo on 01/10/2022 which showed normal ejection fraction of 65 to 70%.  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  She at present time feels tremor is not disabling and does not want medications for it. Update 10/14/2022 : She returns for follow-up after last visit 6 months ago.  She is accompanied by her daughter-in-law.  Patient states that her husband died 2 weeks ago from terminal cancer.  He is quite affected by this.  He fortunately had no recurrent stroke or TIA symptoms.  Remains on Plavix which is tolerating well without bruising or bleeding.  She is also on Eliquis for pulmonary embolism but does have an appointment to see her hematologist next week to see if she can come off it.  She did switch to Repatha injections.  Tolerating well without side effects.  Last lipid profile on 06/15/2022 showed LDL cholesterol to be near optimal at 77 mg percent.  Triglycerides still elevated at 198 she underwent robotic hysterectomy 05/2022.  Patient's daughter-in-law is concerned about having tremors.  Patient states she has had this for at least 20+ years.  Tremors are mild involving her hands occasionally she has trouble writing.  The tremors are not functionally disabling.  She does not want to try medications for this and.  Her Sister had similar tremors.  She  does not drink alcohol and does not know if it can affect her tremors Update 10/27/2023 : She returns for follow-up after last visit a year ago.  She is doing well.  She denies any recurrent stroke or TIA symptoms.  She has now been off Eliquis for 6 months and is tolerating Plavix well without significant bleeding or bruising.  She states her blood pressure is under good control.  She is getting Repatha injections and last lipid profile on 06/22/2023 showed LDL cholesterol yet to be 604 mg percent.  Carotid ultrasound on 10/16/2022 showed no significant extracranial stenosis and transcranial Doppler studies were normal.  She continues to have tremors in the hand mostly the right.  She states her tremors are not bothersome and she is gotten used to it.  She does not want to increase her medicines or try any medications for the same as they are not functionally disabling. ROS:   14 system review of systems is positive for bruising, nasal bleeding, hemolytic anemia, tremor renal failure, DVT and all other systems negative  PMH:  Past Medical History:  Diagnosis Date   Atherosclerosis    Deep vein thrombosis (DVT) (HCC)    Hemolytic anemia (HCC)    Hypertension    Stroke [facility])     Social History:  Social History   Socioeconomic History   Marital status: Widowed    Spouse name: Not on file   Number of children: 3   Years of education: 30   Highest education level: 12th grade  Occupational History   Occupation: Retired  Tobacco Use   Smoking status: Former    Current packs/day: 0.00    Average packs/day: 1.5 packs/day for 50.0 years (75.0 ttl pk-yrs)    Types: Cigarettes    Start date: 09/1971    Quit date: 09/2021    Years since quitting: 2.1   Smokeless tobacco: Never  Vaping Use   Vaping status: Never Used  Substance and Sexual Activity   Alcohol use: Not Currently   Drug use: Not Currently   Sexual activity: Not Currently  Other Topics Concern   Not on file  Social History  Narrative   Lives alone. One cat, inside and outside.   Social Drivers of Health   Financial Resource Strain: Patient Declined (12/27/2022)   Received from [facility], [facility]   Overall Financial Resource Strain (CARDIA)    Difficulty of Paying Living Expenses: Patient declined  Food Insecurity: Patient Declined (12/27/2022)   Received from [facility], [facility]   Hunger Vital Sign    Worried About Running Out of Food in the Last Year: Patient declined    Ran Out of Food in the Last Year: Patient declined  Transportation Needs: Patient Declined (12/27/2022)   Received from [facility], [facility]   [facility] - Transportation    In the past 12 months, has lack of transportation kept you from medical appointments or from getting medications?: Patient declined    Lack of Transportation (Non-Medical): Patient declined  Physical  Activity: Insufficiently Active (11/26/2021)   Exercise Vital Sign    Days of Exercise per Week: 2 days    Minutes of Exercise per Session: 60 min  Stress: Stress Concern Present (11/26/2021)   [employer] of Occupational Health - Occupational Stress Questionnaire    Feeling of Stress : To some extent  Social Connections: Moderately Isolated (11/26/2021)   Social Connection and Isolation Panel [NHANES]    Frequency of Communication with Friends and Family: More than three times a week    Frequency of Social Gatherings with Friends and Family: More than three times a week    Attends Religious Services: Never    Database administrator or Organizations: No    Attends Banker Meetings: Never    Marital Status: Married  Catering manager Violence: Not At Risk (11/26/2021)   Humiliation, Afraid, Rape, and Kick questionnaire    Fear of Current or Ex-Partner: No    Emotionally Abused: No    Physically Abused: No    Sexually Abused: No     Medications:   Current Outpatient Medications on File Prior to Visit  Medication Sig Dispense Refill   amLODipine (NORVASC) 5 MG tablet Take 1 tablet (5 mg total) by mouth daily. 90 tablet 1   citalopram (CELEXA) 10 MG tablet Take 10 mg by mouth daily.     clopidogrel (PLAVIX) 75 MG tablet TAKE 1 TABLET EVERY DAY 90 tablet 0   Evolocumab (REPATHA SURECLICK) 140 MG/ML SOAJ ADMINISTER 1 ML(140 MG) UNDER THE SKIN EVERY 14 DAYS 2 mL 0   losartan (COZAAR) 50 MG tablet Take 1 tablet (50 mg total) by mouth daily. 90 tablet 0   Magnesium 250 MG TABS Take 250 mg by mouth daily.     pantoprazole (PROTONIX) 40 MG tablet TAKE 1 TABLET(40 MG) BY MOUTH DAILY 90 tablet 1   MAGNESIUM PO Take by mouth.     metoprolol succinate (TOPROL XL) 25 MG 24 hr tablet Take 1 tablet (25 mg total) by mouth daily. 30 tablet 11   No current facility-administered medications on file prior to visit.    Allergies:  No Known Allergies  Physical Exam General: well developed, well nourished, seated, in no evident distress Head: head normocephalic and atraumatic.  Neck: supple with no carotid or supraclavicular bruits Cardiovascular: regular rate and rhythm, no murmurs Musculoskeletal: no deformity Skin:  no rash/petichiae Vascular:  Normal pulses all extremities Vitals:   10/27/23 1259  BP: (!) 142/86  Pulse: (!) 55   Neurologic Exam Mental Status: Awake and fully alert. Oriented to place and time. Recent and remote memory intact. Attention span, concentration and fund of knowledge appropriate. Mood and affect appropriate.  Cranial Nerves: Fundoscopic exam reveals sharp disc margins. Pupils equal, briskly reactive to light. Extraocular movements full without nystagmus. Visual fields full to confrontation. Hearing intact. Facial sensation intact. Face, tongue, palate moves normally and symmetrically.  Motor: Normal bulk and tone. Normal strength in all tested extremity muscles.  Diminished fine finger movements on  the right.  Orbits left over right upper extremity.  Intermittent  action tremor mostly in the right upper extremity and to a much lesser extent in the left upper extremity.  No cogwheel rigidity or bradykinesia. Sensory.: intact to touch ,pinprick .position and vibratory sensation.  Coordination: Rapid alternating movements normal in all extremities. Finger-to-nose and heel-to-shin performed accurately bilaterally. Gait and Station: Arises from chair without difficulty. Stance is normal. Gait demonstrates normal stride length and balance . Able to  heel, toe and tandem walk with  difficulty.  Reflexes: 1+ and symmetric. Toes downgoing.       ASSESSMENT:72 year old Caucasian lady with left ACA infarct and November 2022 secondary to left ACA stenosis treated with successful mechanical thrombectomy requiring rescue left ACA stenting.  Vascular risk factors of hyperlipidemia, hypertension and intracranial stenosis.  She also developed DVT in December 2022 and has been on Eliquis since then.  New complaints of longstanding mild bilateral upper extremity action tremor slightly benign essential tremor given family history for the same.  The tremors are not functionally disabling.  Reluctant to increase medications at the present     PLAN:I had a long d/w patient  and her daughter in law about her remote stroke,intracranial stent, risk for recurrent stroke/TIAs, personally independently reviewed imaging studies and stroke evaluation results and answered questions.Continue   Plavix 75 mg daily secondary stroke prevention for her intracranial stent . Maintain strict control of hypertension with blood pressure goal below 130/90, diabetes with hemoglobin A1c goal below 6.5% and lipids with LDL cholesterol goal below 70 mg/dL. I also advised the patient to eat a healthy diet with plenty of whole grains, cereals, fruits and vegetables, exercise regularly and maintain ideal body weight.   Patient also has  longstanding history of mild benign essential tremor and not functionally disabling and she is not keen to try medications at the present time.  She is on low-dose metoprolol but given her resting heart rate  do not recommend increasing the dose at the present time..  Followup in the future with me in 1 year or call earlier if necessary.   Greater than 50% of time during this 35 minute visit was spent on counseling,explanation of diagnosis of stroke, intracranial tremor and hypertension with tremor, planning of further management, discussion with patient and family and coordination of care Delia Heady, MD Note: This document was prepared with digital dictation and possible smart phrase technology. Any transcriptional errors that result from this process are unintentional

## 2023-10-27 NOTE — Telephone Encounter (Signed)
Pt brought in her completed portion of the patient assistance form for repatha. I have completed our portion and Dr Pearlean Brownie signed the script. Faxed into [employer]

## 2023-10-28 ENCOUNTER — Telehealth: Payer: Self-pay | Admitting: *Deleted

## 2023-10-28 ENCOUNTER — Other Ambulatory Visit: Payer: Self-pay | Admitting: Neurology

## 2023-10-28 DIAGNOSIS — R748 Abnormal levels of other serum enzymes: Secondary | ICD-10-CM

## 2023-10-28 NOTE — Telephone Encounter (Signed)
-----   Message from Creig Hines sent at 10/25/2023  2:33 PM EST ----- Repeat cmp in 2 weeks. Ast and alt and mildly elevated

## 2023-10-28 NOTE — Telephone Encounter (Signed)
I called the pt to say that the ast and alt was a mildly elevated  . Andrea Williamson wants to see if you can repeat the lab in 2 weeks. I wondered if you could come on 12/23. Please let me know if this is good and what time we can put the order in. You can call me or my chart if you use it.

## 2023-10-31 ENCOUNTER — Other Ambulatory Visit: Payer: Self-pay

## 2023-10-31 DIAGNOSIS — R748 Abnormal levels of other serum enzymes: Secondary | ICD-10-CM

## 2023-11-03 ENCOUNTER — Telehealth: Payer: Self-pay | Admitting: *Deleted

## 2023-11-03 ENCOUNTER — Other Ambulatory Visit: Payer: Self-pay | Admitting: Neurology

## 2023-11-03 NOTE — Telephone Encounter (Signed)
I called pt and asked her if she could come on 12/23 and she is ok with that

## 2023-11-07 ENCOUNTER — Inpatient Hospital Stay: Payer: Medicare HMO

## 2023-11-07 DIAGNOSIS — R748 Abnormal levels of other serum enzymes: Secondary | ICD-10-CM

## 2023-11-07 DIAGNOSIS — Z825 Family history of asthma and other chronic lower respiratory diseases: Secondary | ICD-10-CM | POA: Diagnosis not present

## 2023-11-07 DIAGNOSIS — Z809 Family history of malignant neoplasm, unspecified: Secondary | ICD-10-CM | POA: Diagnosis not present

## 2023-11-07 DIAGNOSIS — G4700 Insomnia, unspecified: Secondary | ICD-10-CM | POA: Diagnosis not present

## 2023-11-07 DIAGNOSIS — D509 Iron deficiency anemia, unspecified: Secondary | ICD-10-CM | POA: Diagnosis not present

## 2023-11-07 DIAGNOSIS — Z87891 Personal history of nicotine dependence: Secondary | ICD-10-CM | POA: Diagnosis not present

## 2023-11-07 DIAGNOSIS — D5911 Warm autoimmune hemolytic anemia: Secondary | ICD-10-CM | POA: Diagnosis not present

## 2023-11-07 DIAGNOSIS — R5383 Other fatigue: Secondary | ICD-10-CM | POA: Diagnosis not present

## 2023-11-07 DIAGNOSIS — Z86711 Personal history of pulmonary embolism: Secondary | ICD-10-CM | POA: Diagnosis not present

## 2023-11-07 DIAGNOSIS — Z79899 Other long term (current) drug therapy: Secondary | ICD-10-CM | POA: Diagnosis not present

## 2023-11-07 LAB — CMP (CANCER CENTER ONLY)
ALT: 59 U/L — ABNORMAL HIGH (ref 0–44)
AST: 42 U/L — ABNORMAL HIGH (ref 15–41)
Albumin: 4.2 g/dL (ref 3.5–5.0)
Alkaline Phosphatase: 70 U/L (ref 38–126)
Anion gap: 10 (ref 5–15)
BUN: 25 mg/dL — ABNORMAL HIGH (ref 8–23)
CO2: 22 mmol/L (ref 22–32)
Calcium: 9.5 mg/dL (ref 8.9–10.3)
Chloride: 106 mmol/L (ref 98–111)
Creatinine: 1.38 mg/dL — ABNORMAL HIGH (ref 0.44–1.00)
GFR, Estimated: 41 mL/min — ABNORMAL LOW (ref >60)
Glucose, Bld: 105 mg/dL — ABNORMAL HIGH (ref 70–99)
Potassium: 4.1 mmol/L (ref 3.5–5.1)
Sodium: 138 mmol/L (ref 135–145)
Total Bilirubin: 0.4 mg/dL (ref <1.2)
Total Protein: 7.4 g/dL (ref 6.5–8.1)

## 2023-12-02 ENCOUNTER — Telehealth: Payer: Self-pay | Admitting: Neurology

## 2023-12-02 MED ORDER — CLOPIDOGREL BISULFATE 75 MG PO TABS: 75.0000 mg | ORAL_TABLET | Freq: Every day | ORAL | 3 refills | Status: DC

## 2023-12-02 NOTE — Telephone Encounter (Signed)
Refill sent for the patinet

## 2023-12-02 NOTE — Telephone Encounter (Addendum)
Pt states  [id number] [address], [place]   Is her new pharmacy and that a new Rx for her  clopidogrel (PLAVIX) 75 MG tablet  needs to be sent to them

## 2023-12-20 ENCOUNTER — Telehealth: Payer: Self-pay | Admitting: Neurology

## 2023-12-20 NOTE — Telephone Encounter (Signed)
Medication need PA

## 2023-12-20 NOTE — Telephone Encounter (Signed)
Pt states she has received a letter from her insurance stating a PA is needed on her  Evolocumab (REPATHA SURECLICK) 140 MG/ML SOAJ

## 2023-12-21 ENCOUNTER — Encounter: Payer: Self-pay | Admitting: Oncology

## 2023-12-21 ENCOUNTER — Telehealth: Payer: Self-pay

## 2023-12-21 ENCOUNTER — Other Ambulatory Visit (HOSPITAL_COMMUNITY): Payer: Self-pay

## 2023-12-21 NOTE — Telephone Encounter (Signed)
 PA request has been Submitted. New Encounter created for follow up. For additional info see Pharmacy Prior Auth telephone encounter from 12/21/2023.

## 2023-12-21 NOTE — Telephone Encounter (Signed)
 Pharmacy Patient Advocate Encounter   Received notification from Physician's Office that prior authorization for Repatha  SureClick 140MG /ML auto-injectors is required/requested.   Insurance verification completed.   The patient is insured through [facility] .   Per test claim: PA required; PA submitted to above mentioned insurance via CoverMyMeds Key/confirmation #/EOC [id number] Status is pending

## 2023-12-22 NOTE — Telephone Encounter (Signed)
 [employer] (Shai) PA for Repatha  need to be received by 12/24/23 on 4:48 am   Contact info: [phone] Reference no: [id number]

## 2023-12-22 NOTE — Telephone Encounter (Signed)
 PA Repatha  complete waiting on approval

## 2023-12-23 NOTE — Telephone Encounter (Signed)
 Received a fax requesting additional information-Faxed completed form along with clinicals and labs to [employer] at [phone]. Awaiting determination.

## 2023-12-27 NOTE — Telephone Encounter (Signed)
Pt said [employer] requesting prior authorization determination letter for the planned year

## 2024-01-03 ENCOUNTER — Other Ambulatory Visit (HOSPITAL_COMMUNITY): Payer: Self-pay

## 2024-01-03 NOTE — Telephone Encounter (Signed)
Pharmacy Patient Advocate Encounter  Received notification from [facility] that Prior Authorization for Repatha has been APPROVED from 12/23/2023 to 11/14/2024. Unable to obtain price due to refill too soon rejection, last fill date 01/02/2024 next available fill date3/08/2024   PA #/Case ID/Reference #: PA Case ID #: [id number]

## 2024-03-25 ENCOUNTER — Encounter: Payer: Self-pay | Admitting: Oncology

## 2024-04-17 ENCOUNTER — Encounter: Payer: Self-pay | Admitting: Oncology

## 2024-04-24 ENCOUNTER — Inpatient Hospital Stay: Payer: PPO | Attending: Oncology

## 2024-04-24 ENCOUNTER — Inpatient Hospital Stay (HOSPITAL_BASED_OUTPATIENT_CLINIC_OR_DEPARTMENT_OTHER): Payer: PPO | Admitting: Oncology

## 2024-04-24 ENCOUNTER — Encounter: Payer: Self-pay | Admitting: Oncology

## 2024-04-24 VITALS — BP 140/84 | HR 61 | Temp 98.8°F | Resp 18 | Ht 62.0 in | Wt 176.9 lb

## 2024-04-24 DIAGNOSIS — I82403 Acute embolism and thrombosis of unspecified deep veins of lower extremity, bilateral: Secondary | ICD-10-CM | POA: Diagnosis not present

## 2024-04-24 DIAGNOSIS — Z809 Family history of malignant neoplasm, unspecified: Secondary | ICD-10-CM | POA: Diagnosis not present

## 2024-04-24 DIAGNOSIS — Z7902 Long term (current) use of antithrombotics/antiplatelets: Secondary | ICD-10-CM | POA: Insufficient documentation

## 2024-04-24 DIAGNOSIS — I2699 Other pulmonary embolism without acute cor pulmonale: Secondary | ICD-10-CM | POA: Insufficient documentation

## 2024-04-24 DIAGNOSIS — Z86718 Personal history of other venous thrombosis and embolism: Secondary | ICD-10-CM | POA: Insufficient documentation

## 2024-04-24 DIAGNOSIS — D509 Iron deficiency anemia, unspecified: Secondary | ICD-10-CM

## 2024-04-24 DIAGNOSIS — R110 Nausea: Secondary | ICD-10-CM | POA: Diagnosis not present

## 2024-04-24 DIAGNOSIS — Z825 Family history of asthma and other chronic lower respiratory diseases: Secondary | ICD-10-CM | POA: Diagnosis not present

## 2024-04-24 DIAGNOSIS — D5911 Warm autoimmune hemolytic anemia: Secondary | ICD-10-CM

## 2024-04-24 DIAGNOSIS — E785 Hyperlipidemia, unspecified: Secondary | ICD-10-CM | POA: Diagnosis not present

## 2024-04-24 DIAGNOSIS — Z87891 Personal history of nicotine dependence: Secondary | ICD-10-CM | POA: Insufficient documentation

## 2024-04-24 DIAGNOSIS — I10 Essential (primary) hypertension: Secondary | ICD-10-CM | POA: Diagnosis not present

## 2024-04-24 DIAGNOSIS — Z7901 Long term (current) use of anticoagulants: Secondary | ICD-10-CM | POA: Diagnosis not present

## 2024-04-24 DIAGNOSIS — Z90710 Acquired absence of both cervix and uterus: Secondary | ICD-10-CM | POA: Diagnosis not present

## 2024-04-24 DIAGNOSIS — Z86711 Personal history of pulmonary embolism: Secondary | ICD-10-CM

## 2024-04-24 DIAGNOSIS — Z79899 Other long term (current) drug therapy: Secondary | ICD-10-CM | POA: Diagnosis not present

## 2024-04-24 DIAGNOSIS — Z8673 Personal history of transient ischemic attack (TIA), and cerebral infarction without residual deficits: Secondary | ICD-10-CM | POA: Insufficient documentation

## 2024-04-24 LAB — COMPREHENSIVE METABOLIC PANEL WITH GFR
ALT: 74 U/L — ABNORMAL HIGH (ref 0–44)
AST: 48 U/L — ABNORMAL HIGH (ref 15–41)
Albumin: 4.4 g/dL (ref 3.5–5.0)
Alkaline Phosphatase: 75 U/L (ref 38–126)
Anion gap: 8 (ref 5–15)
BUN: 31 mg/dL — ABNORMAL HIGH (ref 8–23)
CO2: 21 mmol/L — ABNORMAL LOW (ref 22–32)
Calcium: 9.3 mg/dL (ref 8.9–10.3)
Chloride: 107 mmol/L (ref 98–111)
Creatinine, Ser: 1.26 mg/dL — ABNORMAL HIGH (ref 0.44–1.00)
GFR, Estimated: 45 mL/min — ABNORMAL LOW (ref >60)
Glucose, Bld: 98 mg/dL (ref 70–99)
Potassium: 4.3 mmol/L (ref 3.5–5.1)
Sodium: 136 mmol/L (ref 135–145)
Total Bilirubin: 1 mg/dL (ref 0.0–1.2)
Total Protein: 7.6 g/dL (ref 6.5–8.1)

## 2024-04-24 LAB — CBC WITH DIFFERENTIAL/PLATELET
Abs Immature Granulocytes: 0.03 10*3/uL (ref 0.00–0.07)
Basophils Absolute: 0.1 10*3/uL (ref 0.0–0.1)
Basophils Relative: 1 %
Eosinophils Absolute: 0.3 10*3/uL (ref 0.0–0.5)
Eosinophils Relative: 6 %
HCT: 41.6 % (ref 36.0–46.0)
Hemoglobin: 14 g/dL (ref 12.0–15.0)
Immature Granulocytes: 1 %
Lymphocytes Relative: 35 %
Lymphs Abs: 1.9 10*3/uL (ref 0.7–4.0)
MCH: 31.4 pg (ref 26.0–34.0)
MCHC: 33.7 g/dL (ref 30.0–36.0)
MCV: 93.3 fL (ref 80.0–100.0)
Monocytes Absolute: 0.6 10*3/uL (ref 0.1–1.0)
Monocytes Relative: 10 %
Neutro Abs: 2.6 10*3/uL (ref 1.7–7.7)
Neutrophils Relative %: 47 %
Platelets: 234 10*3/uL (ref 150–400)
RBC: 4.46 MIL/uL (ref 3.87–5.11)
RDW: 12.8 % (ref 11.5–15.5)
WBC: 5.4 10*3/uL (ref 4.0–10.5)
nRBC: 0 % (ref 0.0–0.2)

## 2024-04-24 LAB — RETICULOCYTES
Immature Retic Fract: 10.8 % (ref 2.3–15.9)
RBC.: 4.43 MIL/uL (ref 3.87–5.11)
Retic Count, Absolute: 107.2 10*3/uL (ref 19.0–186.0)
Retic Ct Pct: 2.4 % (ref 0.4–3.1)

## 2024-04-24 LAB — IRON AND TIBC
Iron: 120 ug/dL (ref 28–170)
Saturation Ratios: 32 % — ABNORMAL HIGH (ref 10.4–31.8)
TIBC: 379 ug/dL (ref 250–450)
UIBC: 259 ug/dL

## 2024-04-24 LAB — FERRITIN: Ferritin: 68 ng/mL (ref 11–307)

## 2024-04-24 NOTE — Progress Notes (Signed)
 Hematology/Oncology Consult note [facility], [place]  [phone] [phone]  Patient Care Team: Nestor Banter, MD as PCP - General (Family Medicine) Constancia Delton, MD as PCP - Cardiology (Cardiology) Avonne Boettcher, MD as Consulting Physician (Hematology and Oncology)   Name of the patient: Andrea Williamson  [id number]  February 10, 1951   Date of visit: 04/24/24  Diagnosis- 1.  History of warm autoimmune hemolytic anemia 2.  History of iron deficiency anemia  Chief complaint/ Reason for visit-routine follow-up of anemia  Heme/Onc history:  patient is a 73 year old female with a past medical history significant for hypertension and hyperlipidemia who was admitted to [facility] for symptoms of acute stroke when she presented with right lower extremity weakness and difficulty speaking on 10/01/2021.  CT head showed acute infarct in the left frontal lobe ACA territory.  She underwent cerebral angiogram and underwent thrombectomy with revascularization and left P2 stenting by Dr. Nanine Babcock and subsequently discharged to rehab.  She was started on aspirin  and Brilinta .  During that admission patient had a normal CBC with a white count of 9.3, H&H of 13/37.8 and a platelet count of 245.  Patient was noted to have increasing icterus as well as nausea and dizziness which prompted her visit to the ER.  On admission patient was noted to have a white cell count of 22, H&H of 4.5/13.4 and MCV of 111 with a platelet count of 352.  Total bilirubin was elevated at 6 thereby raising the concern for possible hemolytic anemia.  Patient was started on high-dose steroids and transferred to [facility].  She received 1 dose of rituximab  there.  She subsequently completed 4 weekly cycles of Rituxan  in January 2023   CT chest abdomen and pelvis with contrast did not show any evidence of malignancy but did show a possible Right adnexal mass for which she saw Dr. Orvil Bland from GYN oncology.   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 Hb dropped to 8. No overt GI bleeding. Eliquis  was held. Ivc filter was inserted. New segmental PE. Eliquis  therefore restarted and was subsequently stopped in December 2023.  Patient is on Plavix  for her history of stroke    Interval history-presently patient is doing well.  She is trying to lose weight.  She is overall active.  ECOG PS- 1 Pain scale- 0   Review of systems- Review of Systems  Constitutional:  Negative for chills, fever, malaise/fatigue and weight loss.  HENT:  Negative for congestion, ear discharge and nosebleeds.   Eyes:  Negative for blurred vision.  Respiratory:  Negative for cough, hemoptysis, sputum production, shortness of breath and wheezing.   Cardiovascular:  Negative for chest pain, palpitations, orthopnea and claudication.  Gastrointestinal:  Negative for abdominal pain, blood in stool, constipation, diarrhea, heartburn, melena, nausea and vomiting.  Genitourinary:  Negative for dysuria, flank pain, frequency, hematuria and urgency.  Musculoskeletal:  Negative for back pain, joint pain and myalgias.  Skin:  Negative for rash.  Neurological:  Negative for dizziness, tingling, focal weakness, seizures, weakness and headaches.  Endo/Heme/Allergies:  Does not bruise/bleed easily.  Psychiatric/Behavioral:  Negative for depression and suicidal ideas. The patient does not have insomnia.       No Known Allergies   Past Medical History:  Diagnosis Date   Atherosclerosis    Deep vein  thrombosis (DVT) (HCC)    Hemolytic anemia (HCC)    Hypertension    Stroke [facility])      Past Surgical History:  Procedure Laterality Date   ANKLE SURGERY Left 2008   IR CT HEAD LTD  10/05/2021   IR INTRA CRAN STENT  10/01/2021   IR PERCUTANEOUS ART  THROMBECTOMY/INFUSION INTRACRANIAL INC DIAG ANGIO  10/01/2021   IR US  GUIDE VASC ACCESS RIGHT  10/01/2021   IVC FILTER INSERTION N/A 01/06/2022   Procedure: IVC FILTER INSERTION;  Surgeon: Celso College, MD;  Location: [facility] INVASIVE CV LAB;  Service: Cardiovascular;  Laterality: N/A;   IVC FILTER REMOVAL N/A 12/30/2022   Procedure: IVC FILTER REMOVAL;  Surgeon: Celso College, MD;  Location: [facility] INVASIVE CV LAB;  Service: Cardiovascular;  Laterality: N/A;   RADIOLOGY WITH ANESTHESIA N/A 10/01/2021   Procedure: IR WITH ANESTHESIA;  Surgeon: Radiologist, Medication, MD;  Location: [facility] OR;  Service: Radiology;  Laterality: N/A;   ROBOTIC ASSISTED BILATERAL SALPINGO OOPHERECTOMY Bilateral 06/09/2022   Procedure: XI ROBOTIC ASSISTED BILATERAL SALPINGO OOPHORECTOMY, STAGING INCLUDING TOTAL HYSTERECTOMY, OMENTECTOMY PERITONEAL BIOPSIES;  Surgeon: Suzi Essex, MD;  Location: [facility] ORS;  Service: Gynecology;  Laterality: Bilateral;    Social History   Socioeconomic History   Marital status: Widowed    Spouse name: Not on file   Number of children: 3   Years of education: 9   Highest education level: 12th grade  Occupational History   Occupation: Retired  Tobacco Use   Smoking status: Former    Current packs/day: 0.00    Average packs/day: 1.5 packs/day for 50.0 years (75.0 ttl pk-yrs)    Types: Cigarettes    Start date: 09/1971    Quit date: 09/2021    Years since quitting: 2.6   Smokeless tobacco: Never  Vaping Use   Vaping status: Never Used  Substance and Sexual Activity   Alcohol use: Not Currently   Drug use: Not Currently   Sexual activity: Not Currently  Other Topics Concern   Not on file  Social History Narrative   Lives alone. One cat, inside and outside.   Social Drivers of Corporate investment banker Strain: Low Risk  (12/29/2023)   [facility]   Overall Financial Resource Strain (CARDIA)    Difficulty of Paying Living Expenses: Not hard  at all  Food Insecurity: No Food Insecurity (12/29/2023)   Received from [facility]   Hunger Vital Sign    Worried About Running Out of Food in the Last Year: Never true    Ran Out of Food in the Last Year: Never true  Transportation Needs: No Transportation Needs (12/29/2023)   Received from [facility] - Transportation    In the past 12 months, has lack of transportation kept you from medical appointments or from getting medications?: No    Lack of Transportation (Non-Medical): No  Physical Activity: Insufficiently Active (11/26/2021)   Exercise Vital Sign    Days of Exercise per Week: 2 days    Minutes of Exercise per Session: 60 min  Stress: Stress Concern Present (11/26/2021)   [employer] of Occupational Health - Occupational Stress Questionnaire    Feeling of Stress : To some extent  Social Connections: Moderately Isolated (11/26/2021)   Social Connection and Isolation Panel [NHANES]    Frequency of Communication with Friends and Family: More than three times a week    Frequency of Social Gatherings  with Friends and Family: More than three times a week    Attends Religious Services: Never    Active Member of Clubs or Organizations: No    Attends Banker Meetings: Never    Marital Status: Married  Catering manager Violence: Not At Risk (11/26/2021)   Humiliation, Afraid, Rape, and Kick questionnaire    Fear of Current or Ex-Partner: No    Emotionally Abused: No    Physically Abused: No    Sexually Abused: No    Family History  Problem Relation Age of Onset   COPD Mother    Cancer Mother    Colon cancer Neg Hx    Breast cancer Neg Hx    Ovarian cancer Neg Hx    Endometrial cancer Neg Hx    Prostate cancer Neg Hx    Pancreatic cancer Neg Hx      Current Outpatient Medications:    amLODipine  (NORVASC ) 5 MG tablet, Take 1 tablet (5 mg total) by mouth daily., Disp: 90 tablet, Rfl: 1   citalopram (CELEXA) 10  MG tablet, Take 10 mg by mouth daily., Disp: , Rfl:    clopidogrel  (PLAVIX ) 75 MG tablet, Take 1 tablet (75 mg total) by mouth daily., Disp: 90 tablet, Rfl: 3   Evolocumab  (REPATHA  SURECLICK) 140 MG/ML SOAJ, ADMINISTER 1 ML(140 MG) UNDER THE SKIN EVERY 14 DAYS, Disp: 6 mL, Rfl: 3   losartan  (COZAAR ) 50 MG tablet, Take 1 tablet (50 mg total) by mouth daily., Disp: 90 tablet, Rfl: 0   Magnesium  250 MG TABS, Take 250 mg by mouth daily., Disp: , Rfl:    MAGNESIUM  PO, Take by mouth., Disp: , Rfl:    metoprolol  succinate (TOPROL  XL) 25 MG 24 hr tablet, Take 1 tablet (25 mg total) by mouth daily., Disp: 30 tablet, Rfl: 11   pantoprazole  (PROTONIX ) 40 MG tablet, TAKE 1 TABLET(40 MG) BY MOUTH DAILY, Disp: 90 tablet, Rfl: 1  Physical exam:  Vitals:   04/24/24 1318  BP: (!) 140/84  Pulse: 61  Resp: 18  Temp: 98.8 F (37.1 C)  TempSrc: Tympanic  SpO2: 96%  Weight: 176 lb 14.4 oz (80.2 kg)  Height: 5\' 2"  (1.575 m)   Physical Exam Cardiovascular:     Rate and Rhythm: Normal rate and regular rhythm.     Heart sounds: Normal heart sounds.  Pulmonary:     Effort: Pulmonary effort is normal.     Breath sounds: Normal breath sounds.  Skin:    General: Skin is warm and dry.  Neurological:     Mental Status: She is alert and oriented to person, place, and time.      I have personally reviewed labs listed below:    Latest Ref Rng & Units 04/24/2024   12:54 PM  CMP  Glucose 70 - 99 mg/dL 98   BUN 8 - 23 mg/dL 31   Creatinine 1.61 - 1.00 mg/dL 0.96   Sodium 045 - 409 mmol/L 136   Potassium 3.5 - 5.1 mmol/L 4.3   Chloride 98 - 111 mmol/L 107   CO2 22 - 32 mmol/L 21   Calcium  8.9 - 10.3 mg/dL 9.3   Total Protein 6.5 - 8.1 g/dL 7.6   Total Bilirubin 0.0 - 1.2 mg/dL 1.0   Alkaline Phos 38 - 126 U/L 75   AST 15 - 41 U/L 48   ALT 0 - 44 U/L 74       Latest Ref Rng & Units 04/24/2024   12:54 PM  CBC  WBC 4.0 - 10.5 K/uL 5.4   Hemoglobin 12.0 - 15.0 g/dL 16.1   Hematocrit 09.6 - 46.0 %  41.6   Platelets 150 - 400 K/uL 234       Assessment and plan- Patient is a 73 y.o. female who is here for routine follow-up of her anemia  Patient was treated for warm autoimmune hemolytic anemia ending in January 2023 and has been in remission since then.  Also has a history of iron deficiency anemia for which she received IV iron last in November 2023.  Patient is not presently anemic with an H&H of 14/41.6.  Given the stability of her counts she does not require any follow-up with me in the future  History of lower extremity DVT: She has been off Eliquis  since December 2023 with no evidence of recurrence since then   Visit Diagnosis 1. Iron deficiency anemia, unspecified iron deficiency anemia type   2. Warm autoimmune hemolytic anemia (HCC)      Dr. Seretha Dance, MD, MPH [facility] [phone] 04/24/2024 3:43 PM

## 2024-06-26 NOTE — Progress Notes (Signed)
 Andrea Williamson is a 73 y.o. female who is here for a follow up visit  HTN: She is taking amlodipine , losartan , and metoprolol  and she tolerates this well. Her blood pressure is at goal on this regimen.  Cerebrovascular disease: She is taking plavix  for stroke prevention. She notes that after her stroke in 2022, her daughter-in-law took her to a lawyer and had a will made and made her POA. She has concerns about the fact that her daughter-in-law might try to take advantage of her.  CKD: Her Cr was 1.4 on recent labs.  Prediabetes: Her fasting blood sugar was 118 and her A1C was 5.9 on recent labs.  Patient Active Problem List  Diagnosis   Tobacco use   Essential hypertension   Anxiety   Chronic anemia   Hypercholesterolemia   Stage 3a chronic kidney disease (CMS/HHS-HCC)   Cerebrovascular disease    Past Medical History:  Diagnosis Date   Anemia    DVT (deep venous thrombosis) (CMS/HHS-HCC)    History of stroke    Hyperlipidemia    Hypertension      Current Outpatient Medications:    amLODIPine  (NORVASC ) 5 MG tablet, TAKE 1 TABLET BY MOUTH EVERY DAY, Disp: 90 tablet, Rfl: 1   citalopram (CELEXA) 20 MG tablet, TAKE 1 TABLET(20 MG) BY MOUTH DAILY, Disp: 90 tablet, Rfl: 1   clopidogreL  (PLAVIX ) 75 mg tablet, Take 75 mg by mouth once daily, Disp: , Rfl:    evolocumab  (REPATHA  SURECLICK) 140 mg/mL PnIj, Inject 140 mg subcutaneously every 14 (fourteen) days, Disp: , Rfl:    losartan  (COZAAR ) 50 MG tablet, TAKE 1 TABLET BY MOUTH EVERY DAY, Disp: 90 tablet, Rfl: 1   magnesium  250 mg Tab, Take 250 mg by mouth once daily, Disp: , Rfl:    metoprolol  SUCCinate (TOPROL -XL) 25 MG XL tablet, TAKE 1 TABLET BY MOUTH EVERY DAY, Disp: 90 tablet, Rfl: 1   pantoprazole  (PROTONIX ) 40 MG DR tablet, TAKE 1 TABLET(40 MG) BY MOUTH DAILY, Disp: 90 tablet, Rfl: 1  No Known Allergies  Results for orders placed or performed in visit on 06/19/24  CBC w/auto Differential (5 Part)  Result  Value Ref Range   WBC (White Blood Cell Count) 5.8 4.1 - 10.2 103/uL   RBC (Red Blood Cell Count) 4.11 4.04 - 5.48 106/uL   Hemoglobin 13.2 12.0 - 15.0 gm/dL   Hematocrit 60.5 64.9 - 47.0 %   MCV (Mean Corpuscular Volume) 95.9 80.0 - 100.0 fl   MCH (Mean Corpuscular Hemoglobin) 32.1 (H) 27.0 - 31.2 pg   MCHC (Mean Corpuscular Hemoglobin Concentration) 33.5 32.0 - 36.0 gm/dL   Platelet Count 769 849 - 450 103/uL   RDW-CV (Red Cell Distribution Width) 13.0 11.6 - 14.8 %   MPV (Mean Platelet Volume) 10.5 9.4 - 12.4 fl   Neutrophils 3.05 1.50 - 7.80 103/uL   Lymphocytes 1.82 1.00 - 3.60 103/uL   Monocytes 0.56 0.00 - 1.50 103/uL   Eosinophils 0.26 0.00 - 0.55 103/uL   Basophils 0.06 0.00 - 0.09 103/uL   Neutrophil % 52.8 32.0 - 70.0 %   Lymphocyte % 31.5 10.0 - 50.0 %   Monocyte % 9.7 4.0 - 13.0 %   Eosinophil % 4.5 1.0 - 5.0 %   Basophil% 1.0 0.0 - 2.0 %   Immature Granulocyte % 0.5 <=0.7 %   Immature Granulocyte Count 0.03 <=0.06 10^3/L  Comprehensive Metabolic Panel (CMP)  Result Value Ref Range   Glucose 118 (H) 70 - 110 mg/dL  Sodium 140 136 - 145 mmol/L   Potassium 4.7 3.6 - 5.1 mmol/L   Chloride 106 97 - 109 mmol/L   Carbon Dioxide (CO2) 25.8 22.0 - 32.0 mmol/L   Urea Nitrogen (BUN) 30 (H) 7 - 25 mg/dL   Creatinine 1.4 (H) 0.6 - 1.1 mg/dL   Glomerular Filtration Rate (eGFR) 40 (L) >60 mL/min/1.73sq m   Calcium  9.8 8.7 - 10.3 mg/dL   AST  41 (H) 8 - 39 U/L   ALT  62 (H) 5 - 38 U/L   Alk Phos (alkaline Phosphatase) 76 34 - 104 U/L   Albumin 4.4 3.5 - 4.8 g/dL   Bilirubin, Total 0.6 0.3 - 1.2 mg/dL   Protein, Total 6.9 6.1 - 7.9 g/dL   A/G Ratio 1.8 1.0 - 5.0 gm/dL  Hemoglobin J8R  Result Value Ref Range   Hemoglobin A1C 5.9 (H) 4.2 - 5.6 %   Average Blood Glucose (Calc) 123 mg/dL   Narrative   Normal Range:    4.2 - 5.6% Increased Risk:  5.7 - 6.4% Diabetes:        >= 6.5% Glycemic Control for adults with diabetes:  <7%    Lipid Panel w/calc LDL  Result  Value Ref Range   Cholesterol, Total 183 100 - 200 mg/dL   Triglyceride 811 35 - 199 mg/dL   HDL (High Density Lipoprotein) Cholesterol 52.5 35.0 - 85.0 mg/dL   LDL Calculated 93 0 - 130 mg/dL   VLDL Cholesterol 38 mg/dL   Cholesterol/HDL Ratio 3.5      Review of Systems  No fever, chills, nausea, or vomiting   Exam  Vitals:   06/26/24 1410  BP: 122/84  Pulse: 60  SpO2: 97%  Weight: 79.4 kg (175 lb)  Height: 154.9 cm (5' 1)    Body mass index is 33.07 kg/m.  General: Patient is well-groomed, well-nourished, appears stated age in no acute distress  HEENT: head is atraumatic and normocephalic, neck is supple with no palpable nodules  CV: Regular rhythm and normal heart rate, no murmur, no JVD  Respiratory: Clear to auscultation throughout lung fields with no wheezing, crackles, or rhonchi. No increased work of breathing   Impression/Plan  HTN: Patient is tolerating medicine well and is compliant with medicine. I discussed limiting sodium intake and keeping track of some blood pressure readings. Blood pressure is at goal on current regimen.  Prediabetes: I discussed limiting sugar and carbohydrate intake.  Cerebrovascular disease: She feels like she has had significant recovery following her stroke a few years ago. She would like to have a letter stating that she is capable of managing her own decisions at this point.  CKD: Her Cr is stable.  This visit was part of longitudinal care for this patient involving long-term management of chronic medical conditions.

## 2024-08-07 ENCOUNTER — Encounter: Payer: Self-pay | Admitting: Oncology

## 2024-08-07 NOTE — Progress Notes (Signed)
 This encounter was created in error - please disregard.  Preop appt moved to next day.

## 2024-10-16 ENCOUNTER — Other Ambulatory Visit: Payer: Self-pay | Admitting: Neurology

## 2024-10-18 ENCOUNTER — Encounter: Payer: Self-pay | Admitting: Oncology

## 2024-10-18 NOTE — Telephone Encounter (Signed)
 Last seen on 10/27/23 Follow up scheduled 10/25/24

## 2024-10-25 ENCOUNTER — Ambulatory Visit: Payer: Medicare HMO | Admitting: Neurology

## 2024-10-25 ENCOUNTER — Encounter: Payer: Self-pay | Admitting: Neurology

## 2024-10-25 VITALS — BP 149/90 | HR 58 | Ht 62.0 in | Wt 179.4 lb

## 2024-10-25 DIAGNOSIS — Z8673 Personal history of transient ischemic attack (TIA), and cerebral infarction without residual deficits: Secondary | ICD-10-CM

## 2024-10-25 DIAGNOSIS — R413 Other amnesia: Secondary | ICD-10-CM

## 2024-10-25 DIAGNOSIS — G3184 Mild cognitive impairment, so stated: Secondary | ICD-10-CM

## 2024-10-25 DIAGNOSIS — G250 Essential tremor: Secondary | ICD-10-CM

## 2024-10-25 NOTE — Progress Notes (Signed)
 [facility] [address] [place]. [place] [address] [phone]       OFFICE FOLLOW-UP NOTE  [name] Date of Birth:  1951-09-10 Medical Record Number:  [id number]   HPI: Initial visit 04/13/2022 Andrea Williamson is a 73 year old lady who is seen today for initial office follow-up visit following hospital admission for stroke in November 2022.  She is accompanied by sister-in-law and history is obtained from them and review of electronic medical records and I personally reviewed pertinent available imaging films in PACS.  She has past medical history of hyperlipidemia and hypertension who was admitted on 10/01/2021 to [facility] for right leg weakness which developed 2 days prior to admission.  She felt she was unsteady on her gait and her leg was no longer supporting her.  CT head showed acute left anterior cerebral artery infarct with CT angiogram showing left A2/ACA occlusion CT perfusion showed 0 core and only 27 cc of penumbra she was considered a candidate for revascularization and underwent mechanical thrombectomy with revascularization but vessel Occlusion Requiring a Rescue Left ACA Stent withTICI 3 revascularization.  MRI scan redemonstrated infarcts in left ACA and ACA/PCA watershed territory and 1 tiny infarct in the right medial parietal lobe.  Follow-up MRI showed patent left A2 stent.  2D echo showed ejection fraction 65 to 70%.  LDL cholesterol was elevated at 232 mg percent and hemoglobin A1c was 5.0.  Patient was on no antithrombotics prior to admission and was placed on aspirin  and Brilinta .  She was seen by physical occupational and speech therapy recommended inpatient rehab.  With plans for dual antiplatelet therapy for 6 months then aspirin  alone.  Patient states subsequently she has been diagnosed with deep vein thrombosis in December 2022 in bilateral lower extremities and has been switched to Brilinta  and Eliquis  which is tolerating well with easy  bruising but no major bleeding episode.  Recently the cost of Brilinta  has gone up and her co-pay has jumped from $46-$300 and she can no longer afford it.  She did have an 1 episode of nasal bleed in February and was seen in the emergency room.  She is also subsequently been diagnosed with hemolytic anemia and sees hematologist Dr. Melanee in [place] and has appointment to see her next month.  She did have follow-up lipid profile on 02/04/2022 which still showed elevated LDL cholesterol of 11/25/2016 with total cholesterol 209.  She had a 2D echo on 01/10/2022 which showed normal ejection fraction of 65 to 70%.  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  She at present time feels tremor is not disabling and does not want medications for it. Update 10/14/2022 : She returns for follow-up after last visit 6 months ago.  She is accompanied by her daughter-in-law.  Patient states that her husband died 2 weeks ago from terminal cancer.  He is quite affected by this.  He fortunately had no recurrent stroke or TIA symptoms.  Remains on Plavix  which is tolerating well without bruising or bleeding.  She is also on Eliquis  for pulmonary embolism but does have an appointment to see her hematologist next week to see if she can come off it.  She did switch to Repatha  injections.  Tolerating well without side effects.  Last lipid profile on 06/15/2022 showed LDL cholesterol to be near optimal at 77 mg percent.  Triglycerides still elevated at 198 she underwent robotic hysterectomy 05/2022.  Patient's daughter-in-law is concerned about having tremors.  Patient states she has had this for at least 20+ years.  Tremors are mild involving her hands occasionally she has trouble writing.  The tremors are not functionally disabling.  She does not want to try medications for this and.  Her Sister had similar tremors.  She  does not drink alcohol and does not know if it can affect her tremors Update 10/27/2023 : She returns for follow-up after last visit a year ago.  She is doing well.  She denies any recurrent stroke or TIA symptoms.  She has now been off Eliquis  for 6 months and is tolerating Plavix  well without significant bleeding or bruising.  She states her blood pressure is under good control.  She is getting Repatha  injections and last lipid profile on 06/22/2023 showed LDL cholesterol yet to be 829 mg percent.  Carotid ultrasound on 10/16/2022 showed no significant extracranial stenosis and transcranial Doppler studies were normal.  She continues to have tremors in the hand mostly the right.  She states her tremors are not bothersome and she is gotten used to it.  She does not want to increase her medicines or try any medications for the same as they are not functionally disabling. Update 10/25/2024 : She returns for follow-up after last visit 1 year ago.  She is accompanied by her daughter-in-law and granddaughter.  Patient doing well without recurrent stroke or TIA symptoms.  She remains on Plavix  which she is tolerating well with easy bruising but no bleeding.  She lives alone son and daughter-in-law who lives nearby provide a lot of support.  She is mostly independent in noted forgetfulness months she has not exhibited any delusions, hallucinations.  She does get frustrated and upset easily but she is not organized does not like to write things down.she continues to have intermittent bilateral hand tremor she is mostly for specific tasks like holding objects.  The tremor is absent at rest.  She still does not find the tremor to be functionally disabling continues to refuse medications for the same. ROS:   14 system review of systems is positive for difficulty, bruising, nasal bleeding, hemolytic anemia, tremor renal failure, DVT and all other systems negative  PMH:  Past Medical History:  Diagnosis Date    Atherosclerosis    Deep vein thrombosis (DVT) (HCC)    Hemolytic anemia    Hypertension    Stroke [facility])     Social History:  Social History   Socioeconomic History   Marital status: Widowed    Spouse name: Not on file   Number of children: 3   Years of education: 98   Highest education level: 12th grade  Occupational History   Occupation: Retired  Tobacco Use   Smoking status: Former    Current packs/day: 0.00    Average packs/day: 1.5 packs/day for 50.0 years (75.0 ttl pk-yrs)    Types: Cigarettes    Start date: 09/1971    Quit date: 09/2021    Years since quitting: 3.1   Smokeless tobacco: Never  Vaping Use   Vaping status: Never Used  Substance and Sexual Activity   Alcohol use: Not Currently   Drug use: Not Currently   Sexual activity: Not Currently  Other Topics Concern   Not on file  Social History Narrative   Lives alone. One cat, inside and outside.   Social Drivers of Health   Tobacco Use:  Medium Risk (10/25/2024)   Patient History    Smoking Tobacco Use: Former    Smokeless Tobacco Use: Never    Passive Exposure: Not on file  Financial Resource Strain: Low Risk  (12/29/2023)   Received from [facility]   Overall Financial Resource Strain (CARDIA)    Difficulty of Paying Living Expenses: Not hard at all  Food Insecurity: No Food Insecurity (12/29/2023)   Received from [facility]   Epic    Within the past 12 months, you worried that your food would run out before you got the money to buy more.: Never true    Within the past 12 months, the food you bought just didn't last and you didn't have money to get more.: Never true  Transportation Needs: No Transportation Needs (12/29/2023)   Received from [facility] - Transportation    In the past 12 months, has lack of transportation kept you from medical appointments or from getting medications?: No    Lack of Transportation (Non-Medical): No   Physical Activity: Insufficiently Active (11/26/2021)   Exercise Vital Sign    Days of Exercise per Week: 2 days    Minutes of Exercise per Session: 60 min  Stress: Stress Concern Present (11/26/2021)   [employer] of [employer]ational [employer] - Occupational Stress Questionnaire    Feeling of Stress : To some extent  Social Connections: Moderately Isolated (11/26/2021)   Social Connection and Isolation Panel    Frequency of Communication with Friends and Family: More than three times a week    Frequency of Social Gatherings with Friends and Family: More than three times a week    Attends Religious Services: Never    Database Administrator or Organizations: No    Attends Banker Meetings: Never    Marital Status: Married  Catering Manager Violence: Not At Risk (11/26/2021)   Humiliation, Afraid, Rape, and Kick questionnaire    Fear of Current or Ex-Partner: No    Emotionally Abused: No    Physically Abused: No    Sexually Abused: No  Depression (PHQ2-9): Low Risk (12/23/2021)   Depression (PHQ2-9)    PHQ-2 Score: 3  Alcohol Screen: Low Risk (11/26/2021)   Alcohol Screen    Last Alcohol Screening Score (AUDIT): 0  Housing: Low Risk  (12/29/2023)   Received from [facility]   Epic    In the last 12 months, was there a time when you were not able to pay the mortgage or rent on time?: No    In the past 12 months, how many times have you moved where you were living?: 1    At any time in the past 12 months, were you homeless or living in a shelter (including now)?: No  Utilities: Not At Risk (12/29/2023)   Received from [facility] Utilities    Threatened with loss of utilities: No  Health Literacy: Not on file    Medications:   Current Outpatient Medications on File Prior to Visit  Medication Sig Dispense Refill   amLODipine  (NORVASC ) 5 MG tablet Take 1 tablet (5 mg total) by mouth daily. 90 tablet 1   citalopram (CELEXA) 10 MG  tablet Take 10 mg by mouth daily.     clopidogrel  (PLAVIX ) 75 MG tablet Take 1 tablet (75 mg total) by mouth daily. 90 tablet 3   Evolocumab  (REPATHA  SURECLICK) 140 MG/ML SOAJ INJECT 140 MG UNDER  THE SKIN EVERY 14 DAYS 2 mL 0   losartan  (COZAAR ) 50 MG tablet Take 1 tablet (50 mg total) by mouth daily. 90 tablet 0   Magnesium  250 MG TABS Take 250 mg by mouth daily.     metoprolol  succinate (TOPROL  XL) 25 MG 24 hr tablet Take 1 tablet (25 mg total) by mouth daily. 30 tablet 11   pantoprazole  (PROTONIX ) 40 MG tablet TAKE 1 TABLET(40 MG) BY MOUTH DAILY 90 tablet 1   MAGNESIUM  PO Take by mouth.     No current facility-administered medications on file prior to visit.    Allergies:  No Known Allergies  Physical Exam General: well developed, well nourished, seated, in no evident distress Head: head normocephalic and atraumatic.  Neck: supple with no carotid or supraclavicular bruits Cardiovascular: regular rate and rhythm, no murmurs Musculoskeletal: no deformity Skin:  no rash/petichiae Vascular:  Normal pulses all extremities Vitals:   10/25/24 1317  BP: (!) 149/90  Pulse: (!) 58   Neurologic Exam Mental Status: Awake and fully alert. Oriented to place and time. Recent and remote memory intact. Attention span, concentration and fund of knowledge appropriate. Mood and affect appropriate.  Diminished recall 2/3.  Able to name 11 animals who can walk on 4 legs.  Clock drawing 4/4 Cranial Nerves: Fundoscopic exam reveals sharp disc margins. Pupils equal, briskly reactive to light. Extraocular movements full without nystagmus. Visual fields full to confrontation. Hearing intact. Facial sensation intact. Face, tongue, palate moves normally and symmetrically.  Motor: Normal bulk and tone. Normal strength in all tested extremity muscles.  Diminished fine finger movements on the right.  Orbits left over right upper extremity.  Intermittent  action tremor mostly in the right upper extremity and to a  much lesser extent in the left upper extremity.  No cogwheel rigidity or bradykinesia. Sensory.: intact to touch ,pinprick .position and vibratory sensation.  Coordination: Rapid alternating movements normal in all extremities. Finger-to-nose and heel-to-shin performed accurately bilaterally. Gait and Station: Arises from chair without difficulty. Stance is normal. Gait demonstrates normal stride length and balance . Able to heel, toe and tandem walk with  difficulty.  Reflexes: 1+ and symmetric. Toes downgoing.       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  Vascular risk factors of hyperlipidemia, hypertension and intracranial stenosis.  She also developed DVT in December 2022 and has been on Eliquis  since then.  Longstanding mild bilateral upper extremity action tremor slightly benign essential tremor given family history for the same.  The tremors are not functionally disabling.  Reluctant to increase medications at the present.  New complaints of memory and mild cognitive difficulties likely due to age-related mild cognitive impairment.     PLAN:I had a long d/w patient  and her daughter in law about her remote stroke,intracranial stent, risk for recurrent stroke/TIAs,mild cognitive impairment, personally independently reviewed imaging studies and stroke evaluation results and answered questions.Continue   Plavix  75 mg daily secondary stroke prevention for her intracranial stent . Maintain strict control of hypertension with blood pressure goal below 130/90, diabetes with hemoglobin A1c goal below 6.5% and lipids with LDL cholesterol goal below 70 mg/dL. I also advised the patient to eat a healthy diet with plenty of whole grains, cereals, fruits and vegetables, exercise regularly and maintain ideal body weight.   Patient also has longstanding  history of mild benign essential tremor and not  functionally disabling and she is not keen to try medications at the present time.  She is on low-dose metoprolol  but given her resting heart rate  do not recommend increasing the dose at the present time.  I recommend she increase participation in cognitively challenging activities like solving crossword puzzles, playing bridge and sudoku.  We also discussed memory compensation strategies..  Followup in the future with me in 1 year or call earlier if necessary.     I personally spent a total of 50 minutes in the care of the patient today including getting/reviewing separately obtained history, performing a medically appropriate exam/evaluation, counseling and educating, placing orders, referring and communicating with other health care professionals, documenting clinical information in the EHR, independently interpreting results, and coordinating care.        Eather Popp, MD Note: This document was prepared with digital dictation and possible smart phrase technology. Any transcriptional errors that result from this process are unintentional

## 2024-10-25 NOTE — Patient Instructions (Addendum)
 I had a long d/w patient  and her daughter in law about her remote stroke,intracranial stent, risk for recurrent stroke/TIAs,mild cognitive impairment, personally independently reviewed imaging studies and stroke evaluation results and answered questions.Continue   Plavix  75 mg daily secondary stroke prevention for her intracranial stent . Maintain strict control of hypertension with blood pressure goal below 130/90, diabetes with hemoglobin A1c goal below 6.5% and lipids with LDL cholesterol goal below 70 mg/dL. I also advised the patient to eat a healthy diet with plenty of whole grains, cereals, fruits and vegetables, exercise regularly and maintain ideal body weight.   Patient also has longstanding history of mild benign essential tremor and not functionally disabling and she is not keen to try medications at the present time.  She is on low-dose metoprolol  but given her resting heart rate  do not recommend increasing the dose at the present time.  I recommend she increase participation in cognitively challenging activities like solving crossword puzzles, playing bridge and sudoku.  We also discussed memory compensation strategies..  Followup in the future with me in 1 year or call earlier if necessary.    Memory Compensation Strategies  Use WARM strategy.  W= write it down  A= associate it  R= repeat it  M= make a mental note  2.   You can keep a Glass Blower/designer.  Use a 3-ring notebook with sections for the following: calendar, important names and phone numbers,  medications, doctors' names/phone numbers, lists/reminders, and a section to journal what you did  each day.   3.    Use a calendar to write appointments down.  4.    Write yourself a schedule for the day.  This can be placed on the calendar or in a separate section of the Memory Notebook.  Keeping a  regular schedule can help memory.  5.    Use medication organizer with sections for each day or morning/evening pills.  You may need  help loading it  6.    Keep a basket, or pegboard by the door.  Place items that you need to take out with you in the basket or on the pegboard.  You may also want to  include a message board for reminders.  7.    Use sticky notes.  Place sticky notes with reminders in a place where the task is performed.  For example:  turn off the  stove placed by the stove, lock the door placed on the door at eye level,  take your medications on  the bathroom mirror or by the place where you normally take your medications.  8.    Use alarms/timers.  Use while cooking to remind yourself to check on food or as a reminder to take your medicine, or as a  reminder to make a call, or as a reminder to perform another task, etc.

## 2024-10-31 ENCOUNTER — Ambulatory Visit (HOSPITAL_COMMUNITY): Admission: RE | Admit: 2024-10-31 | Discharge: 2024-10-31 | Attending: Neurology | Admitting: Neurology

## 2024-10-31 DIAGNOSIS — Z8673 Personal history of transient ischemic attack (TIA), and cerebral infarction without residual deficits: Secondary | ICD-10-CM | POA: Insufficient documentation

## 2024-10-31 DIAGNOSIS — I6529 Occlusion and stenosis of unspecified carotid artery: Secondary | ICD-10-CM | POA: Diagnosis not present

## 2024-10-31 NOTE — Progress Notes (Signed)
 Carotid artery duplex has been completed. Preliminary results can be found in CV Proc through chart review.   10/31/2024 1:43 PM Cathlyn Collet RVT

## 2024-11-05 ENCOUNTER — Ambulatory Visit: Payer: Self-pay | Admitting: Neurology

## 2024-11-15 ENCOUNTER — Other Ambulatory Visit: Payer: Self-pay | Admitting: Neurology

## 2024-11-19 ENCOUNTER — Other Ambulatory Visit: Payer: Self-pay

## 2024-12-11 ENCOUNTER — Other Ambulatory Visit: Payer: Self-pay

## 2024-12-11 ENCOUNTER — Other Ambulatory Visit: Payer: Self-pay | Admitting: Neurology

## 2025-10-24 ENCOUNTER — Ambulatory Visit: Admitting: Neurology
